# Patient Record
Sex: Female | Born: 1990 | Race: Black or African American | Hispanic: No | Marital: Single | Smoking: Never smoker
Health system: Southern US, Community
[De-identification: ages and names within clinical notes are randomized; demographics above are authoritative.]

## PROBLEM LIST (undated history)

## (undated) ENCOUNTER — Ambulatory Visit (HOSPITAL_COMMUNITY): Discharge status: Absent without leave | Payer: BLUE CROSS/BLUE SHIELD

## (undated) DIAGNOSIS — R05 Cough: Secondary | ICD-10-CM

## (undated) DIAGNOSIS — J3501 Chronic tonsillitis: Secondary | ICD-10-CM

## (undated) DIAGNOSIS — R198 Other specified symptoms and signs involving the digestive system and abdomen: Secondary | ICD-10-CM

## (undated) DIAGNOSIS — D649 Anemia, unspecified: Secondary | ICD-10-CM

## (undated) DIAGNOSIS — R1310 Dysphagia, unspecified: Secondary | ICD-10-CM

## (undated) HISTORY — PX: TYMPANOSTOMY TUBE PLACEMENT: SHX32

---
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
## 2011-06-11 ENCOUNTER — Inpatient Hospital Stay (INDEPENDENT_AMBULATORY_CARE_PROVIDER_SITE_OTHER)
Admission: RE | Admit: 2011-06-11 | Discharge: 2011-06-11 | Disposition: A | Discharge status: Home / Self Care | Payer: BC Managed Care – PPO | Source: Ambulatory Visit | Attending: Emergency Medicine | Admitting: Emergency Medicine

## 2011-06-11 DIAGNOSIS — H6690 Otitis media, unspecified, unspecified ear: Secondary | ICD-10-CM

## 2011-06-11 DIAGNOSIS — K1120 Sialoadenitis, unspecified: Secondary | ICD-10-CM

## 2011-06-11 DIAGNOSIS — J069 Acute upper respiratory infection, unspecified: Secondary | ICD-10-CM

## 2011-07-12 ENCOUNTER — Inpatient Hospital Stay (INDEPENDENT_AMBULATORY_CARE_PROVIDER_SITE_OTHER)
Admission: RE | Admit: 2011-07-12 | Discharge: 2011-07-12 | Disposition: A | Discharge status: Home / Self Care | Payer: Self-pay | Source: Ambulatory Visit | Attending: Family Medicine | Admitting: Family Medicine

## 2011-07-12 DIAGNOSIS — J029 Acute pharyngitis, unspecified: Secondary | ICD-10-CM

## 2011-07-12 LAB — POCT RAPID STREP A: Streptococcus, Group A Screen (Direct): NEGATIVE

## 2013-01-01 ENCOUNTER — Encounter (HOSPITAL_COMMUNITY): Payer: Self-pay | Admitting: *Deleted

## 2013-01-01 ENCOUNTER — Emergency Department (HOSPITAL_COMMUNITY)
Admission: EM | Admit: 2013-01-01 | Discharge: 2013-01-01 | Disposition: A | Discharge status: Home / Self Care | Payer: BC Managed Care – PPO | Source: Home / Self Care | Attending: Emergency Medicine | Admitting: Emergency Medicine

## 2013-01-01 DIAGNOSIS — R110 Nausea: Secondary | ICD-10-CM

## 2013-01-01 DIAGNOSIS — IMO0001 Reserved for inherently not codable concepts without codable children: Secondary | ICD-10-CM

## 2013-01-01 DIAGNOSIS — R109 Unspecified abdominal pain: Secondary | ICD-10-CM

## 2013-01-01 DIAGNOSIS — K219 Gastro-esophageal reflux disease without esophagitis: Secondary | ICD-10-CM

## 2013-01-01 LAB — POCT URINALYSIS DIP (DEVICE)
Glucose, UA: NEGATIVE mg/dL
Leukocytes, UA: NEGATIVE
Nitrite: NEGATIVE
Protein, ur: >=300 mg/dL — AB
Urobilinogen, UA: 1 mg/dL (ref 0.0–1.0)

## 2013-01-01 LAB — POCT PREGNANCY, URINE: Preg Test, Ur: NEGATIVE

## 2013-01-01 MED ORDER — ONDANSETRON 4 MG PO TBDP: 4.0000 mg | ORAL_TABLET | Freq: Three times a day (TID) | ORAL | Status: DC | PRN

## 2013-01-01 NOTE — ED Provider Notes (Signed)
History     CSN: [id number]  Arrival date & time 01/01/13  1408   First MD Initiated Contact with Patient 01/01/13 1628      Chief Complaint  Patient presents with  . Abdominal Pain  . Nausea    HPI: Patient is a 22 y.o. female presenting with abdominal pain. The history is provided by the patient.  Abdominal Pain The primary symptoms of the illness include abdominal pain, nausea, vomiting and vaginal bleeding. The primary symptoms of the illness do not include fever, shortness of breath, diarrhea, hematemesis, hematochezia, dysuria or vaginal discharge. The current episode started more than 2 days ago. The onset of the illness was gradual. The problem has not changed since onset. The abdominal pain began more than 2 days ago. The pain came on gradually. The abdominal pain has been unchanged since its onset. The abdominal pain is generalized. The abdominal pain does not radiate. The severity of the abdominal pain is 4/10. The abdominal pain is relieved by nothing.  Nausea began more than 1 week ago. The nausea is associated with eating.  The patient states that she believes she is currently not pregnant. The patient has had a change in bowel habit. Additional symptoms associated with the illness include anorexia and back pain. Symptoms associated with the illness do not include chills, diaphoresis, urgency or frequency.  Pt reports 3 week h/o generalized, intermittent abd pain, decreased appetite and intermittent nausea. States she has vomited x 2 after eating in the 3 weeks. The abd pain is most often in the upper abd region. She also reports constipation though admits she had a normal soft BM today. Also c/o intermittent low back pain. Pt states she is currently on her period but denies any recent unusual vag d/c or UTI sx's. Does admit that these symptoms seem to worsen after eating.   History reviewed. No pertinent past medical history.  History reviewed. No pertinent past surgical  history.  History reviewed. No pertinent family history.  History  Substance Use Topics  . Smoking status: Never Smoker   . Smokeless tobacco: Not on file  . Alcohol Use: No    OB History    Grav Para Term Preterm Abortions TAB SAB Ect Mult Living                  Review of Systems  Constitutional: Negative for fever, chills and diaphoresis.  HENT: Negative.   Eyes: Negative.   Respiratory: Negative.  Negative for shortness of breath.   Cardiovascular: Negative.   Gastrointestinal: Positive for nausea, vomiting, abdominal pain and anorexia. Negative for diarrhea, hematochezia and hematemesis.  Genitourinary: Positive for vaginal bleeding. Negative for dysuria, urgency, frequency and vaginal discharge.  Musculoskeletal: Positive for back pain.  Skin: Negative.   Neurological: Negative.   Hematological: Negative.   Psychiatric/Behavioral: Negative.     Allergies  Review of patient's allergies indicates no known allergies.  Home Medications  No current outpatient prescriptions on file.  BP 132/87  Pulse 82  Temp 98.3 F (36.8 C) (Oral)  Resp 18  SpO2 100%  LMP 01/01/2013  Physical Exam  Constitutional: She is oriented to person, place, and time. She appears well-developed and well-nourished.  Non-toxic appearance. She does not have a sickly appearance. She does not appear ill. No distress.  HENT:  Head: Normocephalic and atraumatic.  Eyes: Conjunctivae normal are normal.  Neck: Neck supple.  Cardiovascular: Normal rate and regular rhythm.   Pulmonary/Chest: Effort normal and breath sounds normal.  Abdominal: Soft. Bowel sounds are normal. She exhibits no distension. There is no tenderness. There is no rebound and no guarding.  Musculoskeletal: Normal range of motion.  Neurological: She is alert and oriented to person, place, and time.  Skin: Skin is warm and dry.  Psychiatric: She has a normal mood and affect.    ED Course  Procedures   Labs Reviewed   POCT URINALYSIS DIP (DEVICE) - Abnormal; Notable for the following:    Bilirubin Urine MODERATE (*)     Ketones, ur 15 (*)     Hgb urine dipstick LARGE (*)     Protein, ur >=300 (*)     All other components within normal limits  POCT PREGNANCY, URINE   No results found.   No diagnosis found.    MDM  3 week h/o vague GI sx's. Denies fever. PE unremarkable. Pt smiling and w/o obvious distress. Normal soft BM today despite c/o constipation. Will treat w/ ODT Zofran for nausea. Recommend OTC Omeprazole daily x 1 mo and encourage f/u w/ her PCP at [facility] if sx's worsen or do not improve. Pt and mother are agreeable.        Leanne Chang, NP 01/01/13 1751

## 2013-01-01 NOTE — ED Notes (Signed)
C/o headache onset 3 weeks ago then low back aching and generalized body aches.  C/o upper abdominal pain 2 weeks ago.  C/o nausea x 4 days and vominted x 1 this AM.  Poor appetitie.  Fever 101 last night.  Had loose BM this AM.

## 2013-01-01 NOTE — ED Provider Notes (Signed)
Medical screening examination/treatment/procedure(s) were performed by non-physician practitioner and as supervising physician I was immediately available for consultation/collaboration.  Raynald Blend, MD 01/01/13 [id number]

## 2013-09-24 ENCOUNTER — Encounter (HOSPITAL_COMMUNITY): Payer: Self-pay | Admitting: Emergency Medicine

## 2013-09-24 ENCOUNTER — Emergency Department (INDEPENDENT_AMBULATORY_CARE_PROVIDER_SITE_OTHER)
Admission: EM | Admit: 2013-09-24 | Discharge: 2013-09-24 | Disposition: A | Discharge status: Home / Self Care | Payer: BC Managed Care – PPO | Source: Home / Self Care | Attending: Family Medicine | Admitting: Family Medicine

## 2013-09-24 DIAGNOSIS — R079 Chest pain, unspecified: Secondary | ICD-10-CM

## 2013-09-24 DIAGNOSIS — L299 Pruritus, unspecified: Secondary | ICD-10-CM

## 2013-09-24 DIAGNOSIS — R109 Unspecified abdominal pain: Secondary | ICD-10-CM

## 2013-09-24 LAB — POCT URINALYSIS DIP (DEVICE)
Glucose, UA: NEGATIVE mg/dL
Leukocytes, UA: NEGATIVE
Nitrite: NEGATIVE
Protein, ur: NEGATIVE mg/dL
Urobilinogen, UA: 0.2 mg/dL (ref 0.0–1.0)

## 2013-09-24 LAB — POCT PREGNANCY, URINE: Preg Test, Ur: NEGATIVE

## 2013-09-24 MED ORDER — 4X PROBIOTIC PO TABS: 1.0000 | ORAL_TABLET | Freq: Every day | ORAL | Status: DC

## 2013-09-24 MED ORDER — PSYLLIUM 28 % PO PACK: 1.0000 | PACK | Freq: Every day | ORAL | Status: DC

## 2013-09-24 NOTE — ED Provider Notes (Signed)
Medical screening examination/treatment/procedure(s) were performed by a resident physician or non-physician practitioner and as the supervising physician I was immediately available for consultation/collaboration.  Desirai Traxler, MD    Socorro Ebron S Faraz Ponciano, MD 09/24/13 1913 

## 2013-09-24 NOTE — ED Provider Notes (Signed)
CSN: [id number]     Arrival date & time 09/24/13  1057 History   First MD Initiated Contact with Patient 09/24/13 1229     Chief Complaint  Patient presents with  . Abdominal Pain   (Consider location/radiation/quality/duration/timing/severity/associated sxs/prior Treatment) HPI Comments: 22 year old female presents complaining of intermittent abdominal pain, intermittent chest pains, intermittent arm pains, intermittent leg pains, itching of her skin of her scalp/arms/legs, and increased "stomach gurgle rumbles." This is been going on for approximately 9 months. She has googled this and she believe she has a stomach parasite. She has not tried taking anything to help any of her symptoms and she has never seen another doctor about this. She denies fever, chills, diarrhea, rectal itching, or vomiting. No travel or unclean water sources. She is urinating normally, no dark urine. No yellowing of skin or eyes. She notes she is not currently having any of the symptoms right now apart from the itching of her skin. No noticeable rash on her skin.   History reviewed. No pertinent past medical history. History reviewed. No pertinent past surgical history. History reviewed. No pertinent family history. History  Substance Use Topics  . Smoking status: Never Smoker   . Smokeless tobacco: Not on file  . Alcohol Use: No   OB History   Grav Para Term Preterm Abortions TAB SAB Ect Mult Living                 Review of Systems  Constitutional: Negative for fever and chills.  Eyes: Negative for visual disturbance.  Respiratory: Negative for cough and shortness of breath.   Cardiovascular: Positive for chest pain. Negative for palpitations and leg swelling.  Gastrointestinal: Positive for nausea and abdominal pain. Negative for vomiting.  Endocrine: Negative for polydipsia and polyuria.  Genitourinary: Negative for dysuria, urgency and frequency.  Musculoskeletal: Positive for arthralgias and  myalgias.  Skin: Negative for rash.       Itching  Neurological: Negative for dizziness, weakness and light-headedness.    Allergies  Review of patient's allergies indicates no known allergies.  Home Medications   Current Outpatient Rx  Name  Route  Sig  Dispense  Refill  . ondansetron (ZOFRAN ODT) 4 MG disintegrating tablet   Oral   Take 1 tablet (4 mg total) by mouth every 8 (eight) hours as needed for nausea.   20 tablet   0   . Probiotic Product (4X PROBIOTIC) TABS   Oral   Take 1-2 capsules by mouth daily.   30 tablet   2     Any combined species probiotic will be appropriate ...   . psyllium (METAMUCIL SMOOTH TEXTURE) 28 % packet   Oral   Take 1 packet by mouth daily.   30 packet   2    BP 117/67  Pulse 67  Temp(Src) 98.7 F (37.1 C) (Oral)  Resp 16  SpO2 100%  LMP 08/03/2013 Physical Exam  Nursing note and vitals reviewed. Constitutional: She is oriented to person, place, and time. Vital signs are normal. She appears well-developed and well-nourished. No distress.  HENT:  Head: Normocephalic and atraumatic.  Right Ear: External ear normal.  Left Ear: External ear normal.  Nose: Nose normal.  Mouth/Throat: Oropharynx is clear and moist. No oropharyngeal exudate.  Eyes: Conjunctivae and EOM are normal. Pupils are equal, round, and reactive to light. Right eye exhibits no discharge. Left eye exhibits no discharge. No scleral icterus.  Neck: Normal range of motion. Neck supple. No thyromegaly present.  Cardiovascular: Normal rate, regular rhythm, normal heart sounds and intact distal pulses.   Pulmonary/Chest: Effort normal and breath sounds normal. No respiratory distress. She has no wheezes. She has no rales.  Abdominal: Bowel sounds are normal. She exhibits no distension and no mass. There is no tenderness. There is no rebound and no guarding.  Musculoskeletal: Normal range of motion. She exhibits no tenderness.  Lymphadenopathy:    She has no cervical  adenopathy.  Neurological: She is alert and oriented to person, place, and time. She has normal strength. Coordination normal.  Skin: Skin is warm and dry. No rash noted. She is not diaphoretic. No erythema. No pallor.  Psychiatric: She has a normal mood and affect. Judgment normal.    ED Course  Procedures (including critical care time) Labs Review Labs Reviewed  STOOL CULTURE  OVA AND PARASITE EXAMINATION  POCT URINALYSIS DIP (DEVICE)  POCT PREGNANCY, URINE   Imaging Review No results found.    MDM   1. Abdominal  pain, other specified site   2. Itchy skin   3. Chest pain    Detailed physical exam is normal. We will check stool ova and parasites at the patient's request. She needs a primary care physician, I have provided her information on finding 1. She will follow up with primary care.   Meds ordered this encounter  Medications  . Probiotic Product (4X PROBIOTIC) TABS    Sig: Take 1-2 capsules by mouth daily.    Dispense:  30 tablet    Refill:  2    Any combined species probiotic will be appropriate.    Order Specific Question:  Supervising Provider    Answer:  Clementeen Graham, Henrietta Dine  . psyllium (METAMUCIL SMOOTH TEXTURE) 28 % packet    Sig: Take 1 packet by mouth daily.    Dispense:  30 packet    Refill:  2    Order Specific Question:  Supervising Provider    Answer:  Clementeen Graham, Kathie Rhodes [[id number]]     Graylon Good, PA-C 09/24/13 1829

## 2013-09-24 NOTE — ED Notes (Signed)
C/o abd pain which has been hurting for a while now in different spots.  Patient states she does have back.

## 2013-09-26 LAB — OVA AND PARASITE EXAMINATION: Ova and parasites: NONE SEEN

## 2014-03-14 ENCOUNTER — Encounter (HOSPITAL_COMMUNITY): Payer: Self-pay | Admitting: Emergency Medicine

## 2014-03-14 ENCOUNTER — Emergency Department (INDEPENDENT_AMBULATORY_CARE_PROVIDER_SITE_OTHER)
Admission: EM | Admit: 2014-03-14 | Discharge: 2014-03-14 | Disposition: A | Discharge status: Home / Self Care | Payer: BC Managed Care – PPO | Source: Home / Self Care

## 2014-03-14 DIAGNOSIS — H5710 Ocular pain, unspecified eye: Secondary | ICD-10-CM

## 2014-03-14 DIAGNOSIS — H109 Unspecified conjunctivitis: Secondary | ICD-10-CM

## 2014-03-14 DIAGNOSIS — H5711 Ocular pain, right eye: Secondary | ICD-10-CM

## 2014-03-14 MED ORDER — TETRACAINE HCL 0.5 % OP SOLN
OPHTHALMIC | Status: AC
  Filled 2014-03-14: qty 2

## 2014-03-14 MED ORDER — TOBRAMYCIN-DEXAMETHASONE 0.3-0.1 % OP SUSP: 1.0000 [drp] | OPHTHALMIC | Status: DC

## 2014-03-14 NOTE — ED Provider Notes (Signed)
CSN: [id number]     Arrival date & time 03/14/14  1154 History   First MD Initiated Contact with Patient 03/14/14 1314     Chief Complaint  Patient presents with  . Eye Pain   (Consider location/radiation/quality/duration/timing/severity/associated sxs/prior Treatment) HPI Comments: 23 year old female is complaining of a foreign body sensation to the right for 2 days. She has some watery discharge. Denies visual disturbance. Whenever she moves her eye right, left, upper daily this gives her the sensation of a foreign body.   History reviewed. No pertinent past medical history. History reviewed. No pertinent past surgical history. No family history on file. History  Substance Use Topics  . Smoking status: Never Smoker   . Smokeless tobacco: Not on file  . Alcohol Use: No   OB History   Grav Para Term Preterm Abortions TAB SAB Ect Mult Living                 Review of Systems  Constitutional: Negative.   HENT: Negative.   Eyes: Positive for pain and discharge. Negative for photophobia and visual disturbance.  All other systems reviewed and are negative.   Allergies  Review of patient's allergies indicates no known allergies.  Home Medications   Prior to Admission medications   Medication Sig Start Date End Date Taking? Authorizing Provider  ondansetron (ZOFRAN ODT) 4 MG disintegrating tablet Take 1 tablet (4 mg total) by mouth every 8 (eight) hours as needed for nausea. 01/01/13   Roma KayserKatherine P Schorr, NP  Probiotic Product (4X PROBIOTIC) TABS Take 1-2 capsules by mouth daily. 09/24/13   Adrian BlackwaterZachary H Baker, PA-C  psyllium (METAMUCIL SMOOTH TEXTURE) 28 % packet Take 1 packet by mouth daily. 09/24/13   Adrian BlackwaterZachary H Baker, PA-C   BP 143/89  Pulse 100  Temp(Src) 98.6 F (37 C) (Oral)  Resp 16  SpO2 100%  LMP 03/07/2014 Physical Exam  Nursing note and vitals reviewed. Constitutional: She is oriented to person, place, and time. She appears well-developed and well-nourished. No  distress.  Eyes: EOM are normal. Pupils are equal, round, and reactive to light.  No obvious foreign body seen with the naked eye. There is mild erythema to the lower conjunctiva. No stye formation or other pustules along the eyelash line. After introduction of tetracaine eyedrops the fluorescin stain was applied and examined under magnification and blue light. There was no uptake. No corneal abrasions appreciated. There is swelling to the lateral conjunctival sac. No foreign bodies were seen. No limbal flush. Anterior chamber is clear. The eye was then irrigated with 100 cc of wash.  Neck: Normal range of motion. Neck supple.  Neurological: She is alert and oriented to person, place, and time.  Skin: Skin is warm and dry.  Psychiatric: She has a normal mood and affect.    ED Course  Irrigation Date/Time: 03/14/2014 1:46 PM Performed by: Phineas RealMABE, Damonie Furney Authorized by: Leslee HomeKELLER, Makyla Bye C Consent: Verbal consent obtained. Risks and benefits: risks, benefits and alternatives were discussed Consent given by: patient Patient understanding: patient states understanding of the procedure being performed Patient identity confirmed: verbally with patient Local anesthesia used: yes Local anesthetic: topical anesthetic Patient sedated: no Patient tolerance: Patient tolerated the procedure well with no immediate complications. Comments: Tetracaine eye drops x 2 to OD, Fluorisceine stain applied and visualized under blue light and magnification. Irrigated with 100 cc eye wash   (including critical care time) Labs Review Labs Reviewed - No data to display  Results for orders placed during the  hospital encounter of 09/24/13  OVA AND PARASITE EXAMINATION      Result Value Ref Range   Specimen Description STOOL     Special Requests NONE     Ova and parasites       Value: NO OVA OR PARASITES SEEN     [facility]formed at [facility]   Report Status 09/26/2013 FINAL    POCT URINALYSIS DIP (DEVICE)       Result Value Ref Range   Glucose, UA NEGATIVE  NEGATIVE mg/dL   Bilirubin Urine NEGATIVE  NEGATIVE   Ketones, ur NEGATIVE  NEGATIVE mg/dL   Specific Gravity, Urine 1.025  1.005 - 1.030   Hgb urine dipstick NEGATIVE  NEGATIVE   pH 7.0  5.0 - 8.0   Protein, ur NEGATIVE  NEGATIVE mg/dL   Urobilinogen, UA 0.2  0.0 - 1.0 mg/dL   Nitrite NEGATIVE  NEGATIVE   Leukocytes, UA NEGATIVE  NEGATIVE  POCT PREGNANCY, URINE      Result Value Ref Range   Preg Test, Ur NEGATIVE  NEGATIVE   Imaging Review No results found.   MDM   1. Pain in right eye   2. Conjunctivitis      tobradex eye drops as directed F/U with opthal in 2 d if not improving.  Hayden Rasmussenavid Sakiyah Shur, NP 03/14/14 1348

## 2014-03-14 NOTE — ED Notes (Signed)
Complains that something is in right eye, pain started 2 days ago.  No idea what may have flown in eye.  Vision blurry-usually wears glasses, broke them recently

## 2014-03-14 NOTE — Discharge Instructions (Signed)

## 2014-03-15 NOTE — ED Provider Notes (Signed)
Medical screening examination/treatment/procedure(s) were performed by non-physician practitioner and as supervising physician I was immediately available for consultation/collaboration.  Leslee Homeavid Gurveer Colucci, M.D.  Reuben Likesavid C Elma Limas, MD 03/15/14 [phone]

## 2014-08-04 ENCOUNTER — Encounter (HOSPITAL_COMMUNITY): Payer: Self-pay | Admitting: Emergency Medicine

## 2014-08-04 ENCOUNTER — Emergency Department (HOSPITAL_COMMUNITY)
Admission: EM | Admit: 2014-08-04 | Discharge: 2014-08-04 | Disposition: A | Discharge status: Home / Self Care | Payer: BC Managed Care – PPO | Source: Home / Self Care

## 2014-08-04 DIAGNOSIS — H9202 Otalgia, left ear: Secondary | ICD-10-CM

## 2014-08-04 DIAGNOSIS — H6980 Other specified disorders of Eustachian tube, unspecified ear: Secondary | ICD-10-CM

## 2014-08-04 DIAGNOSIS — H6983 Other specified disorders of Eustachian tube, bilateral: Secondary | ICD-10-CM

## 2014-08-04 DIAGNOSIS — H6120 Impacted cerumen, unspecified ear: Secondary | ICD-10-CM

## 2014-08-04 DIAGNOSIS — H6990 Unspecified Eustachian tube disorder, unspecified ear: Secondary | ICD-10-CM

## 2014-08-04 DIAGNOSIS — H6122 Impacted cerumen, left ear: Secondary | ICD-10-CM

## 2014-08-04 DIAGNOSIS — H9209 Otalgia, unspecified ear: Secondary | ICD-10-CM

## 2014-08-04 NOTE — ED Provider Notes (Signed)
Medical screening examination/treatment/procedure(s) were performed by resident physician or non-physician practitioner and as supervising physician I was immediately available for consultation/collaboration.   Keelyn Monjaras DOUGLAS MD.   Agness Sibrian D Elaijah Munoz, MD 08/04/14 1941 

## 2014-08-04 NOTE — ED Provider Notes (Signed)
CSN: [id number]     Arrival date & time 08/04/14  1612 History   First MD Initiated Contact with Patient 08/04/14 1658     Chief Complaint  Patient presents with  . Otalgia   (Consider location/radiation/quality/duration/timing/severity/associated sxs/prior Treatment) HPI Comments: 23 year old female complaining of pain in the left ear for 2 weeks. Denies drainage. Denies sore throat or fever. Denies trauma.   History reviewed. No pertinent past medical history. History reviewed. No pertinent past surgical history. No family history on file. History  Substance Use Topics  . Smoking status: Never Smoker   . Smokeless tobacco: Not on file  . Alcohol Use: No   OB History   Grav Para Term Preterm Abortions TAB SAB Ect Mult Living                 Review of Systems  Constitutional: Negative.   HENT: Positive for ear pain. Negative for congestion, dental problem and hearing loss.   Respiratory: Negative.   Neurological: Negative.     Allergies  Review of patient's allergies indicates no known allergies.  Home Medications   Prior to Admission medications   Medication Sig Start Date End Date Taking? Authorizing Provider  ondansetron (ZOFRAN ODT) 4 MG disintegrating tablet Take 1 tablet (4 mg total) by mouth every 8 (eight) hours as needed for nausea. 01/01/13   Roma Kayser Schorr, NP  Probiotic Product (4X PROBIOTIC) TABS Take 1-2 capsules by mouth daily. 09/24/13   Adrian Blackwater Baker, PA-C  psyllium (METAMUCIL SMOOTH TEXTURE) 28 % packet Take 1 packet by mouth daily. 09/24/13   Adrian Blackwater Baker, PA-C  tobramycin-dexamethasone [facility]) ophthalmic solution Place 1 drop into the right eye every 4 (four) hours while awake. 03/14/14   Hayden Rasmussen, NP   BP 135/80  Pulse 60  Temp(Src) 98.6 F (37 C) (Oral)  SpO2 100%  LMP 07/21/2014 Physical Exam  Nursing note and vitals reviewed. Constitutional: She is oriented to person, place, and time. She appears well-developed and well-nourished.  No distress.  HENT:  Head: Normocephalic and atraumatic.  Mouth/Throat: Oropharynx is clear and moist. No oropharyngeal exudate.  Right TM pearly gray, transparent and mildly retracted. EAC is clear. Left TM partially occluded with cerumen area TM retracted. Post irrigation: EAC is clear of cerumen. TMs is translucent and pearly gray, no erythema or evidence of effusion/fluid. Positive retraction.  Eyes: Conjunctivae and EOM are normal. Pupils are equal, round, and reactive to light.  Neck: Normal range of motion. Neck supple.  Cardiovascular: Normal rate.   Pulmonary/Chest: Effort normal.  Neurological: She is alert and oriented to person, place, and time. She exhibits normal muscle tone.  Skin: Skin is warm and dry.  Psychiatric: She has a normal mood and affect.    ED Course  Procedures (including critical care time) Labs Review Labs Reviewed - No data to display  Imaging Review No results found.   MDM   1. Otalgia of left ear   2. Excess wax in ear, left   3. ETD (eustachian tube dysfunction), bilateral     Sudafed PE 10 mg for congestion Robitussin plain Claritin Apply heat next to the ear     Hayden Rasmussen, NP 08/04/14 1725

## 2014-08-04 NOTE — Discharge Instructions (Signed)
Otalgia Sudafed PE 10 mg for congestion Robitussin plain Claritin Apply heat next to the ear The most common reason for this in children is an infection of the middle ear. Pain from the middle ear is usually caused by a build-up of fluid and pressure behind the eardrum. Pain from an earache can be sharp, dull, or burning. The pain may be temporary or constant. The middle ear is connected to the nasal passages by a short narrow tube called the Eustachian tube. The Eustachian tube allows fluid to drain out of the middle ear, and helps keep the pressure in your ear equalized. CAUSES  A cold or allergy can block the Eustachian tube with inflammation and the build-up of secretions. This is especially likely in small children, because their Eustachian tube is shorter and more horizontal. When the Eustachian tube closes, the normal flow of fluid from the middle ear is stopped. Fluid can accumulate and cause stuffiness, pain, hearing loss, and an ear infection if germs start growing in this area. SYMPTOMS  The symptoms of an ear infection may include fever, ear pain, fussiness, increased crying, and irritability. Many children will have temporary and minor hearing loss during and right after an ear infection. Permanent hearing loss is rare, but the risk increases the more infections a child has. Other causes of ear pain include retained water in the outer ear canal from swimming and bathing. Ear pain in adults is less likely to be from an ear infection. Ear pain may be referred from other locations. Referred pain may be from the joint between your jaw and the skull. It may also come from a tooth problem or problems in the neck. Other causes of ear pain include:  A foreign body in the ear.  Outer ear infection.  Sinus infections.  Impacted ear wax.  Ear injury.  Arthritis of the jaw or TMJ problems.  Middle ear infection.  Tooth infections.  Sore throat with pain to the ears. DIAGNOSIS  Your  caregiver can usually make the diagnosis by examining you. Sometimes other special studies, including x-rays and lab work may be necessary. TREATMENT   If antibiotics were prescribed, use them as directed and finish them even if you or your child's symptoms seem to be improved.  Sometimes PE tubes are needed in children. These are little plastic tubes which are put into the eardrum during a simple surgical procedure. They allow fluid to drain easier and allow the pressure in the middle ear to equalize. This helps relieve the ear pain caused by pressure changes. HOME CARE INSTRUCTIONS   Only take over-the-counter or prescription medicines for pain, discomfort, or fever as directed by your caregiver. DO NOT GIVE CHILDREN ASPIRIN because of the association of Reye's Syndrome in children taking aspirin.  Use a cold pack applied to the outer ear for 15-20 minutes, 03-04 times per day or as needed may reduce pain. Do not apply ice directly to the skin. You may cause frost bite.  Over-the-counter ear drops used as directed may be effective. Your caregiver may sometimes prescribe ear drops.  Resting in an upright position may help reduce pressure in the middle ear and relieve pain.  Ear pain caused by rapidly descending from high altitudes can be relieved by swallowing or chewing gum. Allowing infants to suck on a bottle during airplane travel can help.  Do not smoke in the house or near children. If you are unable to quit smoking, smoke outside.  Control allergies. SEEK IMMEDIATE MEDICAL  CARE IF:   You or your child are becoming sicker.  Pain or fever relief is not obtained with medicine.  You or your child's symptoms (pain, fever, or irritability) do not improve within 24 to 48 hours or as instructed.  Severe pain suddenly stops hurting. This may indicate a ruptured eardrum.  You or your children develop new problems such as severe headaches, stiff neck, difficulty swallowing, or swelling of  the face or around the ear. Document Released: 07/03/2004 Document Revised: 02/08/2012 Document Reviewed: 11/07/2008 [facility] Patient Information 2015 [place], [place]. This information is not intended to replace advice given to you by your health care provider. Make sure you discuss any questions you have with your health care provider.

## 2014-08-04 NOTE — ED Notes (Signed)
C/o left ear pain onset 2 weeks Denies f/v/n/d, cold sx Alert, no signs of acute distress.

## 2014-10-30 DIAGNOSIS — J3501 Chronic tonsillitis: Secondary | ICD-10-CM

## 2014-10-30 HISTORY — DX: Chronic tonsillitis: J35.01

## 2014-11-04 ENCOUNTER — Emergency Department (INDEPENDENT_AMBULATORY_CARE_PROVIDER_SITE_OTHER)
Admission: EM | Admit: 2014-11-04 | Discharge: 2014-11-04 | Disposition: A | Discharge status: Home / Self Care | Payer: BC Managed Care – PPO | Source: Home / Self Care | Attending: Family Medicine | Admitting: Family Medicine

## 2014-11-04 ENCOUNTER — Encounter (HOSPITAL_COMMUNITY): Payer: Self-pay | Admitting: *Deleted

## 2014-11-04 DIAGNOSIS — J039 Acute tonsillitis, unspecified: Secondary | ICD-10-CM

## 2014-11-04 MED ORDER — AMOXICILLIN-POT CLAVULANATE 875-125 MG PO TABS: 1.0000 | ORAL_TABLET | Freq: Two times a day (BID) | ORAL | Status: DC

## 2014-11-04 NOTE — ED Provider Notes (Signed)
[name] R Morand is a 23 y.o. female who presents to Urgent Care today for tonsil swelling and pain. Symptoms present for about a week. Additionally she notes headache. Pain is moderate and worse with swallowing. No chills nausea vomiting or diarrhea. She additionally notes mild left-sided ear pain. No cough congestion chest pain or palpitations. Patient notes a mild subjective fever.  Patient states that she would like her tonsils removed today if possible.   History reviewed. No pertinent past medical history. History reviewed. No pertinent past surgical history. History  Substance Use Topics  . Smoking status: Never Smoker   . Smokeless tobacco: Not on file  . Alcohol Use: No   ROS as above Medications: No current facility-administered medications for this encounter.   Current Outpatient Prescriptions  Medication Sig Dispense Refill  . amoxicillin-clavulanate (AUGMENTIN) 875-125 MG per tablet Take 1 tablet by mouth every 12 (twelve) hours. 14 tablet 0   No Known Allergies   Exam:  BP 135/72 mmHg  Pulse 90  Temp(Src) 98.3 F (36.8 C) (Oral)  Resp 12  SpO2 100%  LMP 10/14/2014 (Approximate) Gen: Well NAD HEENT: EOMI,  MMM tonsils swollen with exudates bilaterally. Tympanic membranes are normal appearing bilaterally. Lungs: Normal work of breathing. CTABL Heart: RRR no MRG Abd: NABS, Soft. Nondistended, Nontender Exts: Brisk capillary refill, warm and well perfused.   No results found for this or any previous visit (from the past 24 hour(s)). No results found.  Assessment and Plan: 23 y.o. female with tonsillitis. Treatment with Augmentin.  Informed patient that she cannot have a tonsillectomy today however she [facility] and [facility] if she would like. NSAIDs or Tylenol for pain control.  Discussed warning signs or symptoms. Please see discharge instructions. Patient expresses understanding.     Rodolph BongEvan S Casanova Schurman,  MD 11/04/14 [phone]

## 2014-11-04 NOTE — ED Notes (Signed)
C/O enlarged, painful tonsils with fevers and slight ear pain x1 wk.  Has tried Theraflu, tea.

## 2014-11-04 NOTE — Discharge Instructions (Signed)
Thank you for coming in today. Take Augmentin twice daily for tonsillitis.  [facility] for tonsil removal.  Tonsillitis Tonsillitis is an infection of the throat that causes the tonsils to become red, tender, and swollen. Tonsils are collections of lymphoid tissue at the back of the throat. Each tonsil has crevices (crypts). Tonsils help fight nose and throat infections and keep infection from spreading to other parts of the body for the first 18 months of life.  CAUSES Sudden (acute) tonsillitis is usually caused by infection with streptococcal bacteria. Long-lasting (chronic) tonsillitis occurs when the crypts of the tonsils become filled with pieces of food and bacteria, which makes it easy for the tonsils to become repeatedly infected. SYMPTOMS  Symptoms of tonsillitis include:  A sore throat, with possible difficulty swallowing.  White patches on the tonsils.  Fever.  Tiredness.  New episodes of snoring during sleep, when you did not snore before.  Small, foul-smelling, yellowish-white pieces of material (tonsilloliths) that you occasionally cough up or spit out. The tonsilloliths can also cause you to have bad breath. DIAGNOSIS Tonsillitis can be diagnosed through a physical exam. Diagnosis can be confirmed with the results of lab tests, including a throat culture. TREATMENT  The goals of tonsillitis treatment include the reduction of the severity and duration of symptoms and prevention of associated conditions. Symptoms of tonsillitis can be improved with the use of steroids to reduce the swelling. Tonsillitis caused by bacteria can be treated with antibiotic medicines. Usually, treatment with antibiotic medicines is started before the cause of the tonsillitis is known. However, if it is determined that the cause is not bacterial, antibiotic medicines will not treat the tonsillitis. If attacks of tonsillitis are severe and frequent,  your health care provider may recommend surgery to remove the tonsils (tonsillectomy). HOME CARE INSTRUCTIONS   Rest as much as possible and get plenty of sleep.  Drink plenty of fluids. While the throat is very sore, eat soft foods or liquids, such as sherbet, soups, or instant breakfast drinks.  Eat frozen ice pops.  Gargle with a warm or cold liquid to help soothe the throat. Mix 1/4 teaspoon of salt and 1/4 teaspoon of baking soda in 8 oz of water. SEEK MEDICAL CARE IF:   Large, tender lumps develop in your neck.  A rash develops.  A green, yellow-brown, or bloody substance is coughed up.  You are unable to swallow liquids or food for 24 hours.  You notice that only one of the tonsils is swollen. SEEK IMMEDIATE MEDICAL CARE IF:   You develop any new symptoms such as vomiting, severe headache, stiff neck, chest pain, or trouble breathing or swallowing.  You have severe throat pain along with drooling or voice changes.  You have severe pain, unrelieved with recommended medications.  You are unable to fully open the mouth.  You develop redness, swelling, or severe pain anywhere in the neck.  You have a fever. MAKE SURE YOU:   Understand these instructions.  Will watch your condition.  Will get help right away if you are not doing well or get worse. Document Released: 08/26/2005 Document Revised: 04/02/2014 Document Reviewed: 05/05/2013 [facility] Patient Information 2015 [place], [place]. This information is not intended to replace advice given to you by your health care provider. Make sure you discuss any questions you have with your health care provider.

## 2014-11-09 ENCOUNTER — Encounter (HOSPITAL_BASED_OUTPATIENT_CLINIC_OR_DEPARTMENT_OTHER): Payer: Self-pay | Admitting: *Deleted

## 2014-11-09 DIAGNOSIS — R059 Cough, unspecified: Secondary | ICD-10-CM

## 2014-11-09 HISTORY — DX: Cough, unspecified: R05.9

## 2014-11-13 ENCOUNTER — Other Ambulatory Visit: Payer: Self-pay | Admitting: Otolaryngology

## 2014-11-13 NOTE — H&P (Signed)
Cheryl Adams,  Gerrianne 23 y.o., female [id number]     Chief Complaint: Recurrent sore throats  HPI: 23 year old black female comes in for evaluation of recurrent sore throats/tonsillitis.  She had tubes in her infancy which are long gone and  her ears are doing well.  For the past several years, she has had frequent recurrent episodes of tonsillitis with large tonsils.  None of these have been strep positive.  She typically is treated with antibiotics and responds promptly.  This past weekend, her throat was sore to the point where her face and neck seemed swollen.  She typically gets increased snoring, headache, ear pain, and nasal congestion along with the tonsillitis.  She does not smoke.  Is not clear that she has sleep apnea.  She has never had mononucleosis to her knowledge.   We decided to proceed with tonsillectomy, possible adenoidectomy.  I discussed the surgery with her in detail including risks and complications.  Questions were answered and informed consent was obtained.  I discussed advancement of diet and activity including return to work and school.  Prescriptions for oxycodone liquid and hydrocodone liquid were written and given.  I will see her back 10 days after surgery.  PMH: Past Medical History  Diagnosis Date  . Chronic tonsillitis 10/2014  . Cough 11/09/2014  . Difficulty swallowing pills     Surg Hx: Past Surgical History  Procedure Laterality Date  . Tympanostomy tube placement      FHx:  No family history on file. SocHx:  reports that she has never smoked. She has never used smokeless tobacco. She reports that she does not drink alcohol or use illicit drugs.  ALLERGIES: No Known Allergies   (Not in a hospital admission)  No results found for this or any previous visit (from the past 48 hour(s)). No results found.  VHQ:IONGEXBMROS:Systemic: Feeling tired (fatigue).  No fever, no night sweats, and no recent weight loss. Eyes: No eye symptoms. Otolaryngeal: No hearing loss.   Earache.  No tinnitus.  Purulent nasal discharge.  No nasal passage blockage (stuffiness).  Snoring.  No sneezing.  Hoarseness  and sore throat. Cardiovascular: No chest pain or discomfort  and no palpitations. Pulmonary: No dyspnea.  Cough.  No wheezing. Gastrointestinal: No dysphagia  and no heartburn.  No nausea, no abdominal pain, and no melena.  No diarrhea. Genitourinary: No dysuria. Endocrine: No muscle weakness. Musculoskeletal: No calf muscle cramps, no arthralgias, and no soft tissue swelling. Neurological: No dizziness, no fainting, no tingling, and no numbness. Psychological: No anxiety  and no depression. Skin: No rash.  Last menstrual period 10/26/2014.   BP:128/76,  HR: 82 b/min,  Height: 5 ft 5.5 in, Weight: 223 lb , BMI: 36.5 kg/m2,   PHYSICAL EXAM: She is heavyset.  She is quite animated.  Mental status is intact.  She appears well in conversational speech.  Voice is clear and respirations unlabored through nose and mouth.  No velopharyngeal insufficiency.  The head is atraumatic and neck supple.  Cranial nerves intact.  Ear canals are clear with normal drums.  Anterior nose mildly congested.  Oral cavity reveals teeth in good repair.  Oropharynx shows bulky 3+ tonsils with active exudate on both sides.  Normal soft palate.  Neck with some mild tender bilateral jugulodigastric adenopathy.   Lungs: Clear to auscultation Heart: Regular rate and rhythm without murmurs Abdomen: Soft, active Extremities: Normal configuration Neurologic: Symmetric, grossly intact.    Assessment/Plan Tonsillitis, chronic (474.00) (J35.01  You're having too many  episodes of tonsillitis.  I do recommend that we take your tonsils out, and any adenoids that may remain.  No strenuous activities for 2 weeks after surgery.  I am leaving you prescriptions for liquid oxycodone which is a strong narcotic pain medication, and hydrocodone liquid narcotic pain medication which is lesser strength.  I will  plan on seeing you back 10 days after surgery.  See our tonsillectomy instructions.  OxyCODONE HCl - 5 MG/5ML Oral Solution;5-10 ml po q4h prn severe pain; Qty300; R0; Rx. Hydrocodone-Acetaminophen 7.5-325 MG/15ML Oral Solution;10-20 ml po q4-6h prn pain; Qty400; R0; Rx  Flo ShanksWOLICKI, Davontay [place]lington 11/13/2014, 1:13 PM

## 2014-11-14 ENCOUNTER — Encounter (HOSPITAL_BASED_OUTPATIENT_CLINIC_OR_DEPARTMENT_OTHER)
Admission: RE | Disposition: A | Discharge status: Home / Self Care | Payer: Self-pay | Source: Ambulatory Visit | Attending: Otolaryngology

## 2014-11-14 ENCOUNTER — Ambulatory Visit (HOSPITAL_BASED_OUTPATIENT_CLINIC_OR_DEPARTMENT_OTHER): Payer: BC Managed Care – PPO | Admitting: Anesthesiology

## 2014-11-14 ENCOUNTER — Ambulatory Visit (HOSPITAL_BASED_OUTPATIENT_CLINIC_OR_DEPARTMENT_OTHER)
Admission: RE | Admit: 2014-11-14 | Discharge: 2014-11-14 | Disposition: A | Discharge status: Home / Self Care | Payer: BC Managed Care – PPO | Source: Ambulatory Visit | Attending: Otolaryngology | Admitting: Otolaryngology

## 2014-11-14 ENCOUNTER — Encounter (HOSPITAL_BASED_OUTPATIENT_CLINIC_OR_DEPARTMENT_OTHER): Payer: Self-pay | Admitting: *Deleted

## 2014-11-14 DIAGNOSIS — R05 Cough: Secondary | ICD-10-CM | POA: Insufficient documentation

## 2014-11-14 DIAGNOSIS — R1319 Other dysphagia: Secondary | ICD-10-CM | POA: Diagnosis not present

## 2014-11-14 DIAGNOSIS — J3501 Chronic tonsillitis: Secondary | ICD-10-CM | POA: Diagnosis present

## 2014-11-14 DIAGNOSIS — Z6839 Body mass index (BMI) 39.0-39.9, adult: Secondary | ICD-10-CM | POA: Diagnosis not present

## 2014-11-14 DIAGNOSIS — R5383 Other fatigue: Secondary | ICD-10-CM | POA: Diagnosis not present

## 2014-11-14 DIAGNOSIS — J039 Acute tonsillitis, unspecified: Secondary | ICD-10-CM

## 2014-11-14 HISTORY — DX: Other specified symptoms and signs involving the digestive system and abdomen: R19.8

## 2014-11-14 HISTORY — PX: TONSILLECTOMY AND ADENOIDECTOMY: SHX28

## 2014-11-14 HISTORY — DX: Chronic tonsillitis: J35.01

## 2014-11-14 HISTORY — DX: Cough: R05

## 2014-11-14 HISTORY — DX: Dysphagia, unspecified: R13.10

## 2014-11-14 LAB — POCT HEMOGLOBIN-HEMACUE
HEMOGLOBIN: 8.2 g/dL — AB (ref 12.0–15.0)
Hemoglobin: 9.4 g/dL — ABNORMAL LOW (ref 12.0–15.0)

## 2014-11-14 SURGERY — TONSILLECTOMY AND ADENOIDECTOMY
Anesthesia: General | Site: Mouth | Laterality: Bilateral

## 2014-11-14 MED ORDER — DEXAMETHASONE SODIUM PHOSPHATE 10 MG/ML IJ SOLN: 8.0000 mg | Freq: Once | INTRAMUSCULAR | Status: DC

## 2014-11-14 MED ORDER — LACTATED RINGERS IV SOLN
INTRAVENOUS | Status: DC
  Administered 2014-11-14: 08:00:00 via INTRAVENOUS

## 2014-11-14 MED ORDER — MIDAZOLAM HCL 5 MG/5ML IJ SOLN
INTRAMUSCULAR | Status: DC | PRN
  Administered 2014-11-14: 2 mg via INTRAVENOUS

## 2014-11-14 MED ORDER — POVIDONE-IODINE 10 % EX SOLN
CUTANEOUS | Status: DC | PRN
Start: 2014-11-14 — End: 2014-11-14
  Administered 2014-11-14: 1 via TOPICAL

## 2014-11-14 MED ORDER — MIDAZOLAM HCL 2 MG/ML PO SYRP: 12.0000 mg | ORAL_SOLUTION | Freq: Once | ORAL | Status: DC | PRN

## 2014-11-14 MED ORDER — PROPOFOL 10 MG/ML IV BOLUS
INTRAVENOUS | Status: DC | PRN
  Administered 2014-11-14: 200 mg via INTRAVENOUS

## 2014-11-14 MED ORDER — LIDOCAINE-EPINEPHRINE (PF) 1 %-1:200000 IJ SOLN
INTRAMUSCULAR | Status: AC
  Filled 2014-11-14: qty 10

## 2014-11-14 MED ORDER — MORPHINE SULFATE 2 MG/ML IJ SOLN: 1.0000 mg | INTRAMUSCULAR | Status: DC | PRN

## 2014-11-14 MED ORDER — DEXTROSE-NACL 5-0.45 % IV SOLN
INTRAVENOUS | Status: DC
  Administered 2014-11-14: 12:00:00 via INTRAVENOUS

## 2014-11-14 MED ORDER — PROPOFOL 10 MG/ML IV BOLUS
INTRAVENOUS | Status: AC
  Filled 2014-11-14: qty 40

## 2014-11-14 MED ORDER — ONDANSETRON HCL 4 MG/2ML IJ SOLN
INTRAMUSCULAR | Status: DC | PRN
  Administered 2014-11-14: 4 mg via INTRAVENOUS

## 2014-11-14 MED ORDER — HYDROMORPHONE HCL 1 MG/ML IJ SOLN: 0.2500 mg | INTRAMUSCULAR | Status: DC | PRN

## 2014-11-14 MED ORDER — MIDAZOLAM HCL 2 MG/2ML IJ SOLN: 1.0000 mg | INTRAMUSCULAR | Status: DC | PRN

## 2014-11-14 MED ORDER — OXYCODONE HCL 5 MG PO TABS: 5.0000 mg | ORAL_TABLET | Freq: Once | ORAL | Status: DC | PRN

## 2014-11-14 MED ORDER — SODIUM CHLORIDE 0.9 % IV SOLN
2.0000 g | Freq: Once | INTRAVENOUS | Status: AC
  Administered 2014-11-14: 2 g via INTRAVENOUS

## 2014-11-14 MED ORDER — DEXAMETHASONE SODIUM PHOSPHATE 4 MG/ML IJ SOLN
INTRAMUSCULAR | Status: DC | PRN
  Administered 2014-11-14: 10 mg via INTRAVENOUS

## 2014-11-14 MED ORDER — HYDROCODONE-ACETAMINOPHEN 7.5-325 MG/15ML PO SOLN
10.0000 mL | ORAL | Status: DC | PRN
  Administered 2014-11-14 (×2): 15 mL via ORAL
  Filled 2014-11-14 (×2): qty 15

## 2014-11-14 MED ORDER — LIDOCAINE HCL (CARDIAC) 20 MG/ML IV SOLN
INTRAVENOUS | Status: DC | PRN
  Administered 2014-11-14: 60 mg via INTRAVENOUS

## 2014-11-14 MED ORDER — GLYCOPYRROLATE 0.2 MG/ML IJ SOLN
INTRAMUSCULAR | Status: DC | PRN
  Administered 2014-11-14: 0.2 mg via INTRAVENOUS

## 2014-11-14 MED ORDER — SUCCINYLCHOLINE CHLORIDE 20 MG/ML IJ SOLN
INTRAMUSCULAR | Status: AC
  Filled 2014-11-14: qty 2

## 2014-11-14 MED ORDER — SUFENTANIL CITRATE 50 MCG/ML IV SOLN
INTRAVENOUS | Status: AC
  Filled 2014-11-14: qty 1

## 2014-11-14 MED ORDER — LIDOCAINE-EPINEPHRINE (PF) 1 %-1:200000 IJ SOLN
INTRAMUSCULAR | Status: DC | PRN
  Administered 2014-11-14: 8 mL

## 2014-11-14 MED ORDER — FENTANYL CITRATE 0.05 MG/ML IJ SOLN: 50.0000 ug | INTRAMUSCULAR | Status: DC | PRN

## 2014-11-14 MED ORDER — SUFENTANIL CITRATE 50 MCG/ML IV SOLN
INTRAVENOUS | Status: DC | PRN
  Administered 2014-11-14: 5 ug via INTRAVENOUS
  Administered 2014-11-14: 20 ug via INTRAVENOUS

## 2014-11-14 MED ORDER — SUCCINYLCHOLINE CHLORIDE 20 MG/ML IJ SOLN
INTRAMUSCULAR | Status: DC | PRN
  Administered 2014-11-14: 120 mg via INTRAVENOUS

## 2014-11-14 MED ORDER — MIDAZOLAM HCL 2 MG/2ML IJ SOLN
INTRAMUSCULAR | Status: AC
  Filled 2014-11-14: qty 2

## 2014-11-14 MED ORDER — OXYCODONE HCL 5 MG/5ML PO SOLN: 5.0000 mg | Freq: Once | ORAL | Status: DC | PRN

## 2014-11-14 SURGICAL SUPPLY — 37 items
CANISTER SUCT 1200ML W/VALVE (MISCELLANEOUS) ×4 IMPLANT
CATH ROBINSON RED A/P 10FR (CATHETERS) ×4 IMPLANT
CLEANER CAUTERY TIP 5X5 PAD (MISCELLANEOUS) ×2 IMPLANT
COAGULATOR SUCT 6 FR SWTCH (ELECTROSURGICAL) ×1
COAGULATOR SUCT SWTCH 10FR 6 (ELECTROSURGICAL) ×3 IMPLANT
COVER MAYO STAND STRL (DRAPES) ×4 IMPLANT
DECANTER SPIKE VIAL GLASS SM (MISCELLANEOUS) IMPLANT
ELECT COATED BLADE 2.86 ST (ELECTRODE) IMPLANT
ELECT REM PT RETURN 9FT ADLT (ELECTROSURGICAL) ×4
ELECT REM PT RETURN 9FT PED (ELECTROSURGICAL)
ELECTRODE REM PT RETRN 9FT PED (ELECTROSURGICAL) IMPLANT
ELECTRODE REM PT RTRN 9FT ADLT (ELECTROSURGICAL) ×2 IMPLANT
FORCEPS TISS BAYO ENTCEPS (INSTRUMENTS) ×4 IMPLANT
GLOVE BIOGEL PI IND STRL 7.0 (GLOVE) ×2 IMPLANT
GLOVE BIOGEL PI INDICATOR 7.0 (GLOVE) ×2
GLOVE ECLIPSE 6.5 STRL STRAW (GLOVE) ×4 IMPLANT
GLOVE ECLIPSE 8.0 STRL XLNG CF (GLOVE) ×4 IMPLANT
GOWN STRL REUS W/ TWL LRG LVL3 (GOWN DISPOSABLE) ×2 IMPLANT
GOWN STRL REUS W/ TWL XL LVL3 (GOWN DISPOSABLE) ×2 IMPLANT
GOWN STRL REUS W/TWL LRG LVL3 (GOWN DISPOSABLE) ×2
GOWN STRL REUS W/TWL XL LVL3 (GOWN DISPOSABLE) ×2
MARKER SKIN DUAL TIP RULER LAB (MISCELLANEOUS) ×4 IMPLANT
NEEDLE SPNL 22GX3.5 QUINCKE BK (NEEDLE) ×4 IMPLANT
NS IRRIG 1000ML POUR BTL (IV SOLUTION) ×4 IMPLANT
PAD CLEANER CAUTERY TIP 5X5 (MISCELLANEOUS) ×2
PENCIL FOOT CONTROL (ELECTRODE) IMPLANT
SHEET MEDIUM DRAPE 40X70 STRL (DRAPES) ×4 IMPLANT
SPONGE GAUZE 4X4 12PLY STER LF (GAUZE/BANDAGES/DRESSINGS) ×4 IMPLANT
SPONGE TONSIL 1 RF SGL (DISPOSABLE) IMPLANT
SPONGE TONSIL 1.25 RF SGL STRG (GAUZE/BANDAGES/DRESSINGS) ×4 IMPLANT
SYR BULB 3OZ (MISCELLANEOUS) ×4 IMPLANT
SYR CONTROL 10ML LL (SYRINGE) ×4 IMPLANT
TOWEL OR 17X24 6PK STRL BLUE (TOWEL DISPOSABLE) ×4 IMPLANT
TUBE CONNECTING 20'X1/4 (TUBING) ×1
TUBE CONNECTING 20X1/4 (TUBING) ×3 IMPLANT
TUBE SALEM SUMP 12R W/ARV (TUBING) ×4 IMPLANT
TUBE SALEM SUMP 16 FR W/ARV (TUBING) ×4 IMPLANT

## 2014-11-14 NOTE — Interval H&P Note (Signed)
History and Physical Interval Note:  11/14/2014 8:54 AM  Audrea MuscatEmily R Sahota  has presented today for surgery, with the diagnosis of CHRONIC TONSILLITIS  The various methods of treatment have been discussed with the patient and family. After consideration of risks, benefits and other options for treatment, the patient has consented to  Procedure(s): BILATERAL TONSILLECTOMY (Bilateral) POSSIBLE BILATERAL ADENOIDECTOMY (Bilateral) as a surgical intervention .  The patient's history has been re-reviewed, patient re-examined, no change in status, stable for surgery.  I have re-reviewed the patient's chart and labs.  Questions were answered to the patient's satisfaction.    Low hemoglobin noted.  No hx unusually heavy periods or other blood loss.  Possibly from chronic disease.  Will recheck hemoglobin in 2 mos and ask for additional help if still low.  Pt and mother understand and agree.   Flo ShanksWOLICKI, Hau Sanor

## 2014-11-14 NOTE — Discharge Instructions (Signed)
Tonsillectomy A tonsillectomy is a surgery to remove your tonsils. Tonsils are lymph tissues at the back and upper part of your throat. Because tonsils can collect debris, they can become infected. Tonsillectomy often is done when nonsurgical treatments have been unsuccessful in resolving problems with tonsils.  LET [facility] PROVIDER KNOW ABOUT:  Any allergies you have.  All medicines you are taking, including vitamins, herbs, eye drops, creams, and over-the-counter medicines, especially those containing aspirin or ibuprofen.  Previous problems you or members of your family have had with the use of anesthetics.  Any blood disorders you have.  Previous surgeries you have had.  Medical conditions you have.  Recent cough or fever you have had. RISKS AND COMPLICATIONS Generally, tonsillectomy is a safe procedure. However, as with any procedure, problems can occur. Possible problems include:  Bleeding.  Infection.  Scarring.  Changes in your sense of taste.  Changes in your voice.  Changes in swallowing. BEFORE THE PROCEDURE  Do not take any aspirin, ibuprofen, or any medicines that may contain these agents for 2 weeks before the procedure.  Do not eat or drink after midnight the night before the procedure. PROCEDURE   You will be given a medicine that makes you go to sleep (general anesthetic).  A device will be placed inside of your mouth that presses your tongue down so that the tonsils at the back of your throat can be removed without cuts on the outside of your neck or throat.  Your tonsils will typically be removed with a device called an electrocautery, which will cut your tonsils out and then shrink the surrounding blood vessels at the same time so that you will not bleed after the procedure.  Usually, stitches will not be used to close the cut. A white scab (eschar) will form in the area where your tonsils used to be. AFTER THE PROCEDURE After surgery, you  will be taken to a recovery area for close monitoring. Once you are awake, stable, and taking fluids well, you will be allowed to go home.  Document Released: 02/28/2001 Document Revised: 11/21/2013 Document Reviewed: 06/13/2013 [facility] Patient Information 2015 [place], [place]. This information is not intended to replace advice given to you by your health care provider. Make sure you discuss any questions you have with your health care provider.  See our tonsillectomy instructions from the office please Call for bleeding, any breathing difficulty, poor pain relief, inability to swallow, high fevers (102 or more)  [phone] for my office. Post Anesthesia Home Care Instructions  Activity: Get plenty of rest for the remainder of the day. A responsible adult should stay with you for 24 hours following the procedure.  For the next 24 hours, DO NOT: -Drive a car -Advertising copywriterperate machinery -Drink alcoholic beverages -Take any medication unless instructed by your physician -Make any legal decisions or sign important papers.  Meals: Start with liquid foods such as gelatin or soup. Progress to regular foods as tolerated. Avoid greasy, spicy, heavy foods. If nausea and/or vomiting occur, drink only clear liquids until the nausea and/or vomiting subsides. Call your physician if vomiting continues.  Special Instructions/Symptoms: Your throat may feel dry or sore from the anesthesia or the breathing tube placed in your throat during surgery. If this causes discomfort, gargle with warm salt water. The discomfort should disappear within 24 hours.

## 2014-11-14 NOTE — Transfer of Care (Signed)
Immediate Anesthesia Transfer of Care Note  Patient: Cheryl Adams  Procedure(s) Performed: Procedure(s): BILATERAL TONSILLECTOMY AND ADENOIDECTOMY (Bilateral)  Patient Location: PACU  Anesthesia Type:General  Level of Consciousness: awake, alert  and oriented  Airway & Oxygen Therapy: Patient Spontanous Breathing and aerosol face mask  Post-op Assessment: Report given to PACU RN and Post -op Vital signs reviewed and stable  Post vital signs: Reviewed and stable  Complications: No apparent anesthesia complications

## 2014-11-14 NOTE — Anesthesia Postprocedure Evaluation (Signed)
  Anesthesia Post-op Note  Patient: Cheryl Adams R Kilpatrick  Procedure(s) Performed: Procedure(s): BILATERAL TONSILLECTOMY AND ADENOIDECTOMY (Bilateral)  Patient Location: PACU  Anesthesia Type:General  Level of Consciousness: awake and alert   Airway and Oxygen Therapy: Patient Spontanous Breathing  Post-op Pain: mild  Post-op Assessment: Post-op Vital signs reviewed, Patient's Cardiovascular Status Stable and Respiratory Function Stable  Post-op Vital Signs: Reviewed  Filed Vitals:   11/14/14 1111  BP:   Pulse: 68  Temp:   Resp: 22    Complications: No apparent anesthesia complications

## 2014-11-14 NOTE — Anesthesia Procedure Notes (Signed)
Procedure Name: Intubation Date/Time: 11/14/2014 9:07 AM Performed by: Zenia ResidesPAYNE, Arushi Partridge D Pre-anesthesia Checklist: Patient identified, Emergency Drugs available, Suction available and Patient being monitored Patient Re-evaluated:Patient Re-evaluated prior to inductionOxygen Delivery Method: Circle System Utilized Preoxygenation: Pre-oxygenation with 100% oxygen Intubation Type: IV induction Ventilation: Mask ventilation without difficulty Laryngoscope Size: Mac and 3 Grade View: Grade I Tube type: Oral Number of attempts: 1 Airway Equipment and Method: stylet and oral airway Placement Confirmation: ETT inserted through vocal cords under direct vision,  positive ETCO2 and breath sounds checked- equal and bilateral Secured at: 22 cm Tube secured with: Tape Dental Injury: Teeth and Oropharynx as per pre-operative assessment

## 2014-11-14 NOTE — Anesthesia Preprocedure Evaluation (Addendum)
Anesthesia Evaluation  Patient identified by MRN, date of birth, ID band Patient awake    Reviewed: Allergy & Precautions, H&P , NPO status , Patient's Chart, lab work & pertinent test results  Airway Mallampati: I  TM Distance: >3 FB Neck ROM: Full    Dental no notable dental hx. (+) Teeth Intact, Dental Advisory Given   Pulmonary neg pulmonary ROS,  breath sounds clear to auscultation  Pulmonary exam normal       Cardiovascular negative cardio ROS  Rhythm:Regular Rate:Normal     Neuro/Psych negative neurological ROS  negative psych ROS   GI/Hepatic negative GI ROS, Neg liver ROS,   Endo/Other  negative endocrine ROSMorbid obesity  Renal/GU negative Renal ROS  negative genitourinary   Musculoskeletal   Abdominal   Peds  Hematology negative hematology ROS (+)   Anesthesia Other Findings   Reproductive/Obstetrics negative OB ROS                            Anesthesia Physical Anesthesia Plan  ASA: II  Anesthesia Plan: General   Post-op Pain Management:    Induction: Intravenous  Airway Management Planned: Oral ETT  Additional Equipment:   Intra-op Plan:   Post-operative Plan: Extubation in OR  Informed Consent: I have reviewed the patients History and Physical, chart, labs and discussed the procedure including the risks, benefits and alternatives for the proposed anesthesia with the patient or authorized representative who has indicated his/her understanding and acceptance.   Dental advisory given  Plan Discussed with: CRNA  Anesthesia Plan Comments:         Anesthesia Quick Evaluation

## 2014-11-14 NOTE — Op Note (Signed)
11/14/2014  10:10 AM    Maurene CapesKeel, Avin  [id number]   Pre-Op Dx:  Chronic recurrent tonsillitis  Post-op Dx: same  Proc: tonsillectomy, adenoid ablation   Surg:  Flo ShanksWOLICKI, Coni Homesley T MD  Anes:  GOT  EBL:  min  Comp:   none  Findings:  3+ protruding tonsils.  Nl soft palate.  Congested inferior turbinates.  Narrow NP with mod residual adenoid tissue.  Procedure:  With the patient in a comfortable supine position,  general orotracheal anesthesia was induced without difficulty.  At an appropriate level, the patient was turned 90 away from anesthesia and placed in Trendelenburg.  A clean preparation and draping was accomplished.  Taking care to protect lips, teeth, and endotracheal tube, the Crowe-Davis mouth gag was introduced, expanded for visualization, and suspended from the Mayo stand in the standard fashion.  The findings were as described above.  Palate  retractor  and mirror were used to examine the nasopharynx with the findings as described above.   Anterior nose was examined with a nasal speculum with the findings as described above.  1% Xylocaine with 1:200,000 epinephrine, 8 cc's, was infiltrated into the peritonsillar planes on both sides for intraoperative hemostasis.  Several minutes were allowed for this to take effect.   Beginning on the  RIGHT side, the tonsil was grasped and retracted medially.  The mucosa over the anterior and superior poles was coagulated and then cut down to the capsule of the tonsil using the Medline thermal forceps.    Using the forceps tip as a blunt dissector, the tonsil was dissected from its muscular fossa from anterior to posterior and from superior to inferior.  Fibrous bands were lysed as necessary.  Crossing vessels were coagulated as identified.  The tonsil was removed in its entirety as determined by examination of both tonsil and fossa.  A small additional quantity of cautery rendered the fossa hemostatic.    After completing the 1st  tonsillectomy, the 2nd one was performed in identical fashion.  After completing both tonsillectomies and rendering the oropharynx hemostatic, a red rubber catheter was placed through the nose and out of the mouth to serve as a palate retractor.  Using suction cautery, the adenoid pad including choanal tags and lateral bands was ablated.   Upon achieving hemostasis in the nasopharynx, the oropharynx was again observed to be hemostatic.    At this point the palate retractor and mouthgag were relaxed for several minutes.  Upon reexpansion,  Hemostasis was observed.  An orogastric tube was briefly placed and a small amount of clear secretions was evacuated.  This tube was removed.  The mouth gag and palate retractor were relaxed and removed.  The dental status is intact.   At this point the procedure was completed.  The patient was returned to anesthesia, awakened, extubated, and transferred to recovery in stable condition.   Dispo:  OR to PACU.   Will observe for six hours, overnight if necessary and then discharged to home in care of family.  Plan:  Analgesia, hydration, limited activity for two weeks.  Advance diet as comfortable.  Return to school or work at 10 days.  Cephus RicherWOLICKI,  Vasili Fok T.  MD.

## 2014-11-14 NOTE — H&P (View-Only) (Signed)
Adams,  Cheryl 23 y.o., female [id number]     Chief Complaint: Recurrent sore throats  HPI: 23-year-old black female comes in for evaluation of recurrent sore throats/tonsillitis.  She had tubes in her infancy which are long gone and  her ears are doing well.  For the past several years, she has had frequent recurrent episodes of tonsillitis with large tonsils.  None of these have been strep positive.  She typically is treated with antibiotics and responds promptly.  This past weekend, her throat was sore to the point where her face and neck seemed swollen.  She typically gets increased snoring, headache, ear pain, and nasal congestion along with the tonsillitis.  She does not smoke.  Is not clear that she has sleep apnea.  She has never had mononucleosis to her knowledge.   We decided to proceed with tonsillectomy, possible adenoidectomy.  I discussed the surgery with her in detail including risks and complications.  Questions were answered and informed consent was obtained.  I discussed advancement of diet and activity including return to work and school.  Prescriptions for oxycodone liquid and hydrocodone liquid were written and given.  I will see her back 10 days after surgery.  PMH: Past Medical History  Diagnosis Date  . Chronic tonsillitis 10/2014  . Cough 11/09/2014  . Difficulty swallowing pills     Surg Hx: Past Surgical History  Procedure Laterality Date  . Tympanostomy tube placement      FHx:  No family history on file. SocHx:  reports that she has never smoked. She has never used smokeless tobacco. She reports that she does not drink alcohol or use illicit drugs.  ALLERGIES: No Known Allergies   (Not in a hospital admission)  No results found for this or any previous visit (from the past 48 hour(s)). No results found.  ROS:Systemic: Feeling tired (fatigue).  No fever, no night sweats, and no recent weight loss. Eyes: No eye symptoms. Otolaryngeal: No hearing loss.   Earache.  No tinnitus.  Purulent nasal discharge.  No nasal passage blockage (stuffiness).  Snoring.  No sneezing.  Hoarseness  and sore throat. Cardiovascular: No chest pain or discomfort  and no palpitations. Pulmonary: No dyspnea.  Cough.  No wheezing. Gastrointestinal: No dysphagia  and no heartburn.  No nausea, no abdominal pain, and no melena.  No diarrhea. Genitourinary: No dysuria. Endocrine: No muscle weakness. Musculoskeletal: No calf muscle cramps, no arthralgias, and no soft tissue swelling. Neurological: No dizziness, no fainting, no tingling, and no numbness. Psychological: No anxiety  and no depression. Skin: No rash.  Last menstrual period 10/26/2014.   BP:128/76,  HR: 82 b/min,  Height: 5 ft 5.5 in, Weight: 223 lb , BMI: 36.5 kg/m2,   PHYSICAL EXAM: She is heavyset.  She is quite animated.  Mental status is intact.  She appears well in conversational speech.  Voice is clear and respirations unlabored through nose and mouth.  No velopharyngeal insufficiency.  The head is atraumatic and neck supple.  Cranial nerves intact.  Ear canals are clear with normal drums.  Anterior nose mildly congested.  Oral cavity reveals teeth in good repair.  Oropharynx shows bulky 3+ tonsils with active exudate on both sides.  Normal soft palate.  Neck with some mild tender bilateral jugulodigastric adenopathy.   Lungs: Clear to auscultation Heart: Regular rate and rhythm without murmurs Abdomen: Soft, active Extremities: Normal configuration Neurologic: Symmetric, grossly intact.    Assessment/Plan Tonsillitis, chronic (474.00) (J35.01  You're having too many   episodes of tonsillitis.  I do recommend that we take your tonsils out, and any adenoids that may remain.  No strenuous activities for 2 weeks after surgery.  I am leaving you prescriptions for liquid oxycodone which is a strong narcotic pain medication, and hydrocodone liquid narcotic pain medication which is lesser strength.  I will  plan on seeing you back 10 days after surgery.  See our tonsillectomy instructions.  OxyCODONE HCl - 5 MG/5ML Oral Solution;5-10 ml po q4h prn severe pain; Qty300; R0; Rx. Hydrocodone-Acetaminophen 7.5-325 MG/15ML Oral Solution;10-20 ml po q4-6h prn pain; Qty400; R0; Rx  Flo ShanksWOLICKI, Tarron Krolak 11/13/2014, 1:13 PM

## 2014-11-15 ENCOUNTER — Encounter (HOSPITAL_BASED_OUTPATIENT_CLINIC_OR_DEPARTMENT_OTHER): Payer: Self-pay | Admitting: Otolaryngology

## 2015-01-15 ENCOUNTER — Emergency Department (HOSPITAL_COMMUNITY)
Admission: EM | Admit: 2015-01-15 | Discharge: 2015-01-15 | Disposition: A | Discharge status: Home / Self Care | Payer: BLUE CROSS/BLUE SHIELD | Source: Home / Self Care | Attending: Family Medicine | Admitting: Family Medicine

## 2015-01-15 ENCOUNTER — Encounter (HOSPITAL_COMMUNITY): Payer: Self-pay | Admitting: *Deleted

## 2015-01-15 DIAGNOSIS — J312 Chronic pharyngitis: Secondary | ICD-10-CM

## 2015-01-15 MED ORDER — CLINDAMYCIN HCL 300 MG PO CAPS: 300.0000 mg | ORAL_CAPSULE | Freq: Three times a day (TID) | ORAL | Status: DC

## 2015-01-15 NOTE — Discharge Instructions (Signed)
Drink plenty of fluids as discussed, use medicine as prescribed, and mucinex or delsym for cough. see your doctor if further problems °

## 2015-01-15 NOTE — ED Notes (Signed)
Pt  Reports     Headache          Body  Aches      Fatigue           X 2-3   Weeks             Pt  Is  Sitting  Upright on  The  Exam table        Speaking  In   Complete  sentances   And  Is            In no  Acute   Distress

## 2015-01-15 NOTE — ED Provider Notes (Signed)
CSN: [id number]     Arrival date & time 01/15/15  1519 History   First MD Initiated Contact with Patient 01/15/15 1702     Chief Complaint  Patient presents with  . Generalized Body Aches   (Consider location/radiation/quality/duration/timing/severity/associated sxs/prior Treatment) Patient is a 24 y.o. female presenting with URI. The history is provided by the patient.  URI Presenting symptoms: congestion, cough and rhinorrhea   Presenting symptoms: no fever and no sore throat   Severity:  Mild Onset quality:  Gradual Duration:  3 weeks Chronicity:  New Relieved by:  None tried Worsened by:  Nothing tried Ineffective treatments:  None tried Associated symptoms: myalgias   Risk factors: recent illness   Risk factors comment:  S/p tonsillectomy in dec 2015   Past Medical History  Diagnosis Date  . Chronic tonsillitis 10/2014  . Cough 11/09/2014  . Difficulty swallowing pills    Past Surgical History  Procedure Laterality Date  . Tympanostomy tube placement    . Tonsillectomy and adenoidectomy Bilateral 11/14/2014    Procedure: BILATERAL TONSILLECTOMY AND ADENOIDECTOMY;  Surgeon: Flo ShanksKarol Wolicki, MD;  Location: [facility];  Service: ENT;  Laterality: Bilateral;   History reviewed. No pertinent family history. History  Substance Use Topics  . Smoking status: Never Smoker   . Smokeless tobacco: Never Used  . Alcohol Use: No   OB History    No data available     Review of Systems  Constitutional: Negative.  Negative for fever.  HENT: Positive for congestion and rhinorrhea. Negative for sore throat.   Respiratory: Positive for cough.   Musculoskeletal: Positive for myalgias.    Allergies  Review of patient's allergies indicates no known allergies.  Home Medications   Prior to Admission medications   Medication Sig Start Date End Date Taking? Authorizing Provider  clindamycin (CLEOCIN) 300 MG capsule Take 1 capsule (300 mg total) by mouth 3 (three)  times daily. 01/15/15   Linna HoffJames D Pierce Biagini, MD   BP 112/67 mmHg  Pulse 63  Temp(Src) 98.4 F (36.9 C) (Oral)  Resp 16  SpO2 100%  LMP 12/30/2014 Physical Exam  Constitutional: She is oriented to person, place, and time. She appears well-developed and well-nourished.  HENT:  Right Ear: External ear normal.  Left Ear: External ear normal.  Mouth/Throat: Oropharynx is clear and moist.  Eyes: Pupils are equal, round, and reactive to light.  Neck: Normal range of motion. Neck supple.  Cardiovascular: Normal heart sounds.   Pulmonary/Chest: Effort normal and breath sounds normal.  Lymphadenopathy:    She has no cervical adenopathy.  Neurological: She is alert and oriented to person, place, and time.  Skin: Skin is warm and dry.  Nursing note and vitals reviewed.   ED Course  Procedures (including critical care time) Labs Review Labs Reviewed - No data to display  Imaging Review No results found.   MDM   1. Chronic pharyngitis        Linna HoffJames D Gaylyn Berish, MD 01/15/15 2049

## 2015-02-20 ENCOUNTER — Ambulatory Visit: Payer: BLUE CROSS/BLUE SHIELD | Admitting: Internal Medicine

## 2015-02-26 ENCOUNTER — Ambulatory Visit (INDEPENDENT_AMBULATORY_CARE_PROVIDER_SITE_OTHER): Payer: BLUE CROSS/BLUE SHIELD | Admitting: Internal Medicine

## 2015-02-26 ENCOUNTER — Encounter: Payer: Self-pay | Admitting: Internal Medicine

## 2015-02-26 VITALS — BP 127/65 | HR 78 | Temp 98.6°F | Ht 65.2 in | Wt 214.3 lb

## 2015-02-26 DIAGNOSIS — D509 Iron deficiency anemia, unspecified: Secondary | ICD-10-CM | POA: Insufficient documentation

## 2015-02-26 DIAGNOSIS — Z0000 Encounter for general adult medical examination without abnormal findings: Secondary | ICD-10-CM

## 2015-02-26 DIAGNOSIS — Z9889 Other specified postprocedural states: Secondary | ICD-10-CM | POA: Diagnosis not present

## 2015-02-26 DIAGNOSIS — Z9089 Acquired absence of other organs: Secondary | ICD-10-CM

## 2015-02-26 LAB — ANEMIA PANEL
%SAT: 3 % — ABNORMAL LOW (ref 20–55)
ABS RETIC: 57.6 10*3/uL (ref 19.0–186.0)
FERRITIN: 3 ng/mL — AB (ref 10–291)
Folate: 17 ng/mL
Iron: 13 ug/dL — ABNORMAL LOW (ref 42–145)
RBC.: 5.24 MIL/uL — ABNORMAL HIGH (ref 3.87–5.11)
Retic Ct Pct: 1.1 % (ref 0.4–2.3)
TIBC: 437 ug/dL (ref 250–470)
UIBC: 424 ug/dL — ABNORMAL HIGH (ref 125–400)
Vitamin B-12: 578 pg/mL (ref 211–911)

## 2015-02-26 LAB — COMPLETE METABOLIC PANEL WITH GFR
ALK PHOS: 53 U/L (ref 39–117)
ALT: 9 U/L (ref 0–35)
AST: 15 U/L (ref 0–37)
Albumin: 4.2 g/dL (ref 3.5–5.2)
BILIRUBIN TOTAL: 1.2 mg/dL (ref 0.2–1.2)
BUN: 7 mg/dL (ref 6–23)
CALCIUM: 10 mg/dL (ref 8.4–10.5)
CHLORIDE: 105 meq/L (ref 96–112)
CO2: 26 meq/L (ref 19–32)
Creat: 0.67 mg/dL (ref 0.50–1.10)
GLUCOSE: 87 mg/dL (ref 70–99)
POTASSIUM: 4.4 meq/L (ref 3.5–5.3)
SODIUM: 139 meq/L (ref 135–145)
Total Protein: 7.6 g/dL (ref 6.0–8.3)

## 2015-02-26 LAB — CBC WITH DIFFERENTIAL/PLATELET
BASOS ABS: 0 10*3/uL (ref 0.0–0.1)
Basophils Relative: 1 % (ref 0–1)
EOS PCT: 2 % (ref 0–5)
Eosinophils Absolute: 0.1 10*3/uL (ref 0.0–0.7)
HCT: 32 % — ABNORMAL LOW (ref 36.0–46.0)
HEMOGLOBIN: 9 g/dL — AB (ref 12.0–15.0)
LYMPHS ABS: 1.4 10*3/uL (ref 0.7–4.0)
Lymphocytes Relative: 32 % (ref 12–46)
MCH: 17.2 pg — AB (ref 26.0–34.0)
MCHC: 28.1 g/dL — ABNORMAL LOW (ref 30.0–36.0)
MCV: 61.1 fL — AB (ref 78.0–100.0)
MONO ABS: 0.4 10*3/uL (ref 0.1–1.0)
Monocytes Relative: 8 % (ref 3–12)
Neutro Abs: 2.6 10*3/uL (ref 1.7–7.7)
Neutrophils Relative %: 57 % (ref 43–77)
Platelets: 450 10*3/uL — ABNORMAL HIGH (ref 150–400)
RBC: 5.24 MIL/uL — ABNORMAL HIGH (ref 3.87–5.11)
RDW: 20.4 % — AB (ref 11.5–15.5)
WBC: 4.5 10*3/uL (ref 4.0–10.5)

## 2015-02-26 LAB — HIV ANTIBODY (ROUTINE TESTING W REFLEX): HIV: NONREACTIVE

## 2015-02-26 LAB — POCT URINE PREGNANCY: Preg Test, Ur: NEGATIVE

## 2015-02-26 LAB — TSH: TSH: 1.352 u[IU]/mL (ref 0.350–4.500)

## 2015-02-26 NOTE — Progress Notes (Signed)
Patient ID: Cheryl Adams, female   DOB: 23-Dec-1990, 23 y.o.   MRN: [id number]    Subjective:   Patient ID: Cheryl Muscatmily R Brisbin female   DOB: 23-Dec-1990 23 y.o.   MRN: [id number]  HPI: Ms.Yassmine R Carolin GuernseyKeel is a 24 y.o. woman with past medical history of unspecified anemia and recent tonsillectomy/adenoidectomy who presents as new patient to establish care.   She reports have chronic tonsillitis requiring tonsillectomy/adenoidectomy in December 2015. She reports doing well after the surgery with no further sore throat and has follow-up appointment with ENT next month.   She had Hg of 8.2 on 11/15/15 with no prior CBC or anemia panel. She reports chronic fatigue, lightheadedness, feeling cold, dry skin, LE muscle cramping, and craving for ice. She reports having heavy, irregular (recenty), and sometimes painful periods. She has never had pelvic/transvaginal US before. She had pap smear testing in 2009 that was normal as well as STD testing. She declines pap smear testing today. She denies BRBPR or hematochezia.   She is not currently taking any prescription or OTC medications. She has one sister and family history of breast cancer in her Aunt (maternal). She has never smoked and does not drink alcohol or use recreational drugs. She is not sexually active and is not on birth control. 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      Past Medical History  Diagnosis Date  . Chronic tonsillitis 10/2014  . Cough 11/09/2014  . Difficulty swallowing pills    Current Outpatient Prescriptions  Medication Sig Dispense Refill  . clindamycin (CLEOCIN) 300 MG capsule Take 1 capsule (300 mg total) by mouth 3 (three) times daily. 21 capsule 0   No current facility-administered medications for this visit.   No family history on file. History   Social History  . Marital Status: Single    Spouse Name: N/A  . Number of Children: N/A  . Years of Education: N/A   Social History Main Topics  .  Smoking status: Never Smoker   . Smokeless tobacco: Never Used  . Alcohol Use: No  . Drug Use: No  . Sexual Activity: Not on file   Other Topics Concern  . None   Social History Narrative   Review of Systems: Review of Systems  Constitutional: Positive for malaise/fatigue (chronic). Negative for fever, chills and weight loss.       Chronically feeling cold. PICA (ice).   HENT: Negative for congestion and sore throat.   Eyes: Negative for blurred vision.       Wears glasses  Respiratory: Negative for cough, shortness of breath and wheezing.   Cardiovascular: Negative for chest pain and leg swelling.  Gastrointestinal: Negative for heartburn, nausea, vomiting, abdominal pain, diarrhea, constipation and blood in stool.  Genitourinary: Negative for dysuria, urgency, frequency and hematuria.  Musculoskeletal: Positive for myalgias (UE and LE) and back pain (history of ). Negative for joint pain.  Skin: Positive for itching (bumps on thigh) and rash (pruritic bumps on thighs).       Dry skin  Neurological: Positive for dizziness (lightheadedness with standing occasionally ) and headaches (tension type before menstrual period).  Endo/Heme/Allergies: Negative for environmental allergies.  Psychiatric/Behavioral: Negative for substance abuse.    Objective:  Physical Exam: Filed Vitals:   02/26/15 1028  BP: 127/65  Pulse: 78  Temp: 98.6 F (37 C)  TempSrc: Oral  Height: 5' 5.2" (1.656 m)  Weight: 214 lb 4.8 oz (97.206 kg)  SpO2: 100%    Physical Exam  Constitutional: She is oriented to person, place, and time. She appears well-developed and well-nourished. No distress.  HENT:  Head: Normocephalic and atraumatic.  Right Ear: External ear normal.  Left Ear: External ear normal.  Nose: Nose normal.  Mouth/Throat: Oropharynx is clear and moist. No oropharyngeal exudate.  Eyes: Conjunctivae and EOM are normal. Pupils are equal, round, and reactive to light. Right eye exhibits no  discharge. Left eye exhibits no discharge. No scleral icterus.  Neck: Normal range of motion. Neck supple.  Cardiovascular: Normal rate, regular rhythm and normal heart sounds.   No murmur heard. Pulmonary/Chest: Effort normal and breath sounds normal. No respiratory distress. She has no wheezes. She has no rales.  Abdominal: Soft. Bowel sounds are normal. She exhibits no distension. There is no tenderness. There is no rebound and no guarding.  Musculoskeletal: Normal range of motion. She exhibits no edema or tenderness.  Neurological: She is alert and oriented to person, place, and time.  Skin: Skin is warm and dry. No rash noted. She is not diaphoretic. No erythema. No pallor.  Psychiatric: She has a normal mood and affect. Her behavior is normal. Judgment and thought content normal.    Assessment & Plan:   Please see problem list for problem-based assessment and plan

## 2015-02-26 NOTE — Patient Instructions (Signed)
-  Will check your bloodwork today and call you with the results  -Take over the counter naproxen as needed for headache -Please come back for a pap smear, very nice meeting you today!    General Instructions:   Thank you for bringing your medicines today. This helps us keep you safe from mistakes.   Progress Toward Treatment Goals:  No flowsheet data found.  Self Care Goals & Plans:  No flowsheet data found.  No flowsheet data found.   Care Management & Community Referrals:  No flowsheet data found.

## 2015-02-27 LAB — URINE CYTOLOGY ANCILLARY ONLY
Chlamydia: NEGATIVE
Neisseria Gonorrhea: NEGATIVE

## 2015-02-27 MED ORDER — FERROUS SULFATE 325 (65 FE) MG PO TABS
325.0000 mg | ORAL_TABLET | Freq: Three times a day (TID) | ORAL | Status: DC
Start: 2015-02-27 — End: 2018-06-02

## 2015-02-27 MED ORDER — DOCUSATE SODIUM 100 MG PO CAPS: 100.0000 mg | ORAL_CAPSULE | Freq: Every day | ORAL | Status: AC | PRN

## 2015-02-27 NOTE — Assessment & Plan Note (Addendum)
Assessment: Pt with hemoglobin of 8.2 on 11/14/14 with no prior CBC or anemia panel who presents with symptoms of chronic anemia most likely due to menorrhagia of unclear etiology.    Plan:  -Obtain CBC w/diff ----> Hg 9 MCV 61.1 (microcytic anemia) with 450K platelet count (reactive to iron-deficiency)  -Obtain anemia panel ---> ferritin very low at 3, TIBC high normal 430, iron low at 13, iron sat low at 3% consistent with iron-deficiency    -Obtain TSH level ---> normal -Obtain CMP ---> normal  -Called pt on 02/27/15 and instructed her to start taking oral ferrous sulfate 325 mg TID with colace 100 mg PRN constipation which she verbalized understanding. She declined gynecology referral at this time. Consider pelvic/transvaginal US to assess for uterine fibroids if not agreeable to gynecology referral.   -Pt to return in 3 months for repeat CBC and iron studies to assess response to oral iron therapy

## 2015-02-27 NOTE — Assessment & Plan Note (Addendum)
-  Pt declined pap smear, will return for one  -Pt declined tdap (may already have had) and annual influenza vaccination -Obtain urine pregnancy test and urine gonorrhea and chlamydia  screening ----> negative -Obtain annual HIV Ab screening ----> non-reactive

## 2015-02-27 NOTE — Assessment & Plan Note (Signed)
Assessment: Pt status post tonsillectomy and adenoidectomy December 2015 in setting of chronic tonsillitis who presents with no further sore throat.   Plan:  -Pt to follow-up with ENT next month

## 2015-02-28 NOTE — Progress Notes (Signed)
Case discussed with Dr. Rabbani soon after the resident saw the patient.  We reviewed the resident's history and exam and pertinent patient test results.  I agree with the assessment, diagnosis, and plan of care documented in the resident's note. 

## 2015-12-03 ENCOUNTER — Ambulatory Visit (INDEPENDENT_AMBULATORY_CARE_PROVIDER_SITE_OTHER): Payer: BLUE CROSS/BLUE SHIELD | Admitting: Emergency Medicine

## 2015-12-03 VITALS — BP 102/74 | HR 53 | Temp 98.5°F | Resp 16 | Ht 65.0 in | Wt 238.0 lb

## 2015-12-03 DIAGNOSIS — Z111 Encounter for screening for respiratory tuberculosis: Secondary | ICD-10-CM

## 2015-12-03 NOTE — Progress Notes (Signed)
Subjective:  Patient ID: [name] R Napolitano, female    DOB: 1990-12-21  Age: 25 y.o. MRN: [id number]  CC: Immunizations   HPI [name] R Hard presents  patient is here requesting a tuberculosis skin test she's had several in the past involving negative she has no recent fever chills or night sweats  History [name] has a past medical history of Chronic tonsillitis (10/2014); Cough (11/09/2014); and Difficulty swallowing pills.   She has past surgical history that includes Tympanostomy tube placement and Tonsillectomy and adenoidectomy (Bilateral, 11/14/2014).   Her  family history is not on file.  She   reports that she has never smoked. She has never used smokeless tobacco. She reports that she does not drink alcohol or use illicit drugs.  Outpatient Prescriptions Prior to Visit  Medication Sig Dispense Refill  . ferrous sulfate 325 (65 FE) MG tablet Take 1 tablet (325 mg total) by mouth 3 (three) times daily with meals. 30 tablet 3  . docusate sodium (COLACE) 100 MG capsule Take 1 capsule (100 mg total) by mouth daily as needed. (Patient not taking: Reported on 12/03/2015)     No facility-administered medications prior to visit.    Social History   Social History  . Marital Status: Single    Spouse Name: N/A  . Number of Children: N/A  . Years of Education: N/A   Social History Main Topics  . Smoking status: Never Smoker   . Smokeless tobacco: Never Used  . Alcohol Use: No  . Drug Use: No  . Sexual Activity: Not Asked   Other Topics Concern  . None   Social History Narrative     Review of Systems  Constitutional: Negative for fever, chills and appetite change.  HENT: Negative for congestion, ear pain, postnasal drip, sinus pressure and sore throat.   Eyes: Negative for pain and redness.  Respiratory: Negative for cough, shortness of breath and wheezing.   Cardiovascular: Negative for leg swelling.  Gastrointestinal: Negative for nausea, vomiting, abdominal pain,  diarrhea, constipation and blood in stool.  Endocrine: Negative for polyuria.  Genitourinary: Negative for dysuria, urgency, frequency and flank pain.  Musculoskeletal: Negative for gait problem.  Skin: Negative for rash.  Neurological: Negative for weakness and headaches.  Psychiatric/Behavioral: Negative for confusion and decreased concentration. The patient is not nervous/anxious.     Objective:  BP 102/74 mmHg  Pulse 53  Temp(Src) 98.5 F (36.9 C)  Resp 16  Ht 5\' 5"  (1.651 m)  Wt 238 lb (107.956 kg)  BMI 39.61 kg/m2  SpO2 99%  Physical Exam  Constitutional: She is oriented to person, place, and time. She appears well-developed and well-nourished.  HENT:  Head: Normocephalic and atraumatic.  Eyes: Conjunctivae are normal. Pupils are equal, round, and reactive to light.  Pulmonary/Chest: Effort normal.  Musculoskeletal: She exhibits no edema.  Neurological: She is alert and oriented to person, place, and time.  Skin: Skin is dry.  Psychiatric: She has a normal mood and affect. Her behavior is normal. Thought content normal.      Assessment & Plan:   [name] was seen today for immunizations.  Diagnoses and all orders for this visit:  Screening-pulmonary TB -     TB Skin Test  I am having Ms. Carolin GuernseyKeel maintain her ferrous sulfate and docusate sodium.  No orders of the defined types were placed in this encounter.    Appropriate red flag conditions were discussed with the patient as well as actions that should be taken.  Patient expressed his understanding.  Follow-up: Return if symptoms worsen or fail to improve.  Roselee Culver, MD

## 2015-12-03 NOTE — Progress Notes (Signed)
  Tuberculosis Risk Questionnaire  1. No Were you born outside the [place] in one of the following parts of the world: [place], [place], [place], [place] or [place]?    2. No Have you traveled outside the [place] and lived for more than one month in one of the following parts of the world: [place], [place], [place], [place] or [place]?    3. No Do you have a compromised immune system such as from any of the following conditions:HIV/AIDS, organ or bone marrow transplantation, diabetes, immunosuppressive medicines (e.g. Prednisone, Remicaide), leukemia, lymphoma, cancer of the head or neck, gastrectomy or jejunal bypass, end-stage renal disease (on dialysis), or silicosis?     4. No Have you ever or do you plan on working in: a residential care center, a health care facility, a jail or prison or homeless shelter?    5. No Have you ever: injected illegal drugs, used crack cocaine, lived in a homeless shelter  or been in jail or prison?     6. No Have you ever been exposed to anyone with infectious tuberculosis?    Tuberculosis Symptom Questionnaire  Do you currently have any of the following symptoms?  1. No Unexplained cough lasting more than 3 weeks?   2. No Unexplained fever lasting more than 3 weeks.   3. No Night Sweats (sweating that leaves the bedclothes and sheets wet)     4. No Shortness of Breath   5. No Chest Pain   6. No Unintentional weight loss    7. No Unexplained fatigue (very tired for no reason)   

## 2015-12-03 NOTE — Patient Instructions (Signed)
Tuberculin Skin Test WHY AM I HAVING THIS TEST? Tuberculosis (TB) is a bacterial infection caused by Mycobacterium tuberculosis. Most people who are exposed to these bacteria have a strong enough defense (immune) system to prevent the bacteria from causing TB and developing symptoms. Their bodies prevent the germs from being active and making them sick (latent TB infection).  However, if you have TB germs in your body and your immune system is weak, you can develop a TB infection. This can cause symptoms such as:   Night sweats.  Fever.  Weakness.  Weight loss. A latent TB infection can also become active later in life if your immune system becomes weakened or compromised. You may have this test if your health care provider suspects that you have TB. You may also have this test to screen for TB if you are at risk for getting the disease. Those at increased risk include:  People who inject illegal drugs or share needles.  People with HIV or other diseases that affect immunity.  Health care workers.  People who live in high-risk communities, such as homeless shelters, nursing homes, and correctional facilities.  People who have been in contact with someone with TB.  People from countries where TB is more common. If you are in a high-risk group, your health care provider may wish to screen for TB more often. This can help prevent the spread of the disease. Sometimes TB screening is required when starting a new job, such as becoming a health care worker or a teacher. Colleges or universities may require it of new students. HOW WILL I BE TESTED? A tuberculin skin test is the main test used to check for exposure to the bacteria that can cause TB. The test checks for antibodies to the bacteria. Antibodies are proteins that your body produces to protect you from germs and other things that can make you sick. Your health care provider will inject a solution known as PPD (purified protein  derivative) under the first layer of skin on your arm. This causes a blister-like bubble to form at the site. Your health care provider will then examine the site after a number of hours have passed to see if a reaction has occurred. HOW DO I PREPARE FOR THE TEST? There is no preparation required for this test. WHAT DO THE RESULTS MEAN? Your test results will be reported as either negative or positive.  If the tuberculin skin test produces a negative result, it is likely that you do not have TB and have not been exposed to the TB bacteria. If you or your health care provider suspects exposure, however, you may want to repeat the test a few weeks later. A blood test may also be used to check for TB. This is because you will not react to the tuberculin skin test until several weeks after exposure to TB bacteria. If you test positive to the tuberculin skin test, it is likely that you have been exposed to TB bacteria. The test does not distinguish between an active and a latent TB infection. A false-positive result can occur. A false-positive result for TB bacteria is incorrect because it indicates a condition or finding is present when it is not. Talk to your health care provider to discuss your results, treatment options, and if necessary, the need for more tests. It is your responsibility to obtain your test results. Ask the lab or department performing the test when and how you will get your results. Talk with your   health care provider if you have any questions about your results.   This information is not intended to replace advice given to you by your health care provider. Make sure you discuss any questions you have with your health care provider.   Document Released: 08/26/2005 Document Revised: 12/07/2014 Document Reviewed: 03/12/2014 Elsevier Interactive Patient Education 2016 [employer]  

## 2015-12-05 ENCOUNTER — Ambulatory Visit (INDEPENDENT_AMBULATORY_CARE_PROVIDER_SITE_OTHER): Payer: BLUE CROSS/BLUE SHIELD

## 2015-12-05 DIAGNOSIS — Z111 Encounter for screening for respiratory tuberculosis: Secondary | ICD-10-CM

## 2015-12-05 LAB — TB SKIN TEST
Induration: 0 mm
TB Skin Test: NEGATIVE

## 2015-12-05 NOTE — Progress Notes (Signed)
   Subjective:    Patient ID: Cheryl Adams, female    DOB: 1991-09-07, 25 y.o.   MRN: [id number]  HPI Patient is here for a TB test. Results negative. Induration 0.7300mm. Patient was given a copy of her results.     Review of Systems     Objective:   Physical Exam        Assessment & Plan:

## 2016-07-07 ENCOUNTER — Ambulatory Visit (INDEPENDENT_AMBULATORY_CARE_PROVIDER_SITE_OTHER): Payer: BLUE CROSS/BLUE SHIELD | Admitting: Internal Medicine

## 2016-07-07 VITALS — BP 116/58 | HR 71 | Temp 98.2°F | Ht 65.0 in | Wt 247.2 lb

## 2016-07-07 DIAGNOSIS — Z9089 Acquired absence of other organs: Secondary | ICD-10-CM

## 2016-07-07 DIAGNOSIS — R1310 Dysphagia, unspecified: Secondary | ICD-10-CM | POA: Diagnosis not present

## 2016-07-07 MED ORDER — LIDOCAINE VISCOUS 2 % MT SOLN: 20.0000 mL | OROMUCOSAL | 0 refills | Status: DC | PRN

## 2016-07-07 NOTE — Progress Notes (Signed)
   CC: odynophagia HPI: [name] is a 25 y.o. woman with PMH noted below here for 1 month duration of odynophagia  Please see Problem List/A&P for the status of the patient's chronic medical problems   Past Medical History:  Diagnosis Date  . Chronic tonsillitis 10/2014  . Cough 11/09/2014  . Difficulty swallowing pills     Review of Systems:  Constitutional: Negative for fever, chills, malaise/fatigue.  HEENT: Denies cough, sore throat, difficulty swallowing both liquids and solid food, denies allergies or postnasal drip. Has right tooth pain and is having it removed on Thursday and is on amoxicillin currently  Respiratory: Negative for cough, shortness of breath and wheezing.   Physical Exam: Vitals:   07/07/16 0850  BP: (!) 116/58  Pulse: 71  Temp: 98.2 F (36.8 C)  TempSrc: Oral  SpO2: 100%  Weight: 247 lb 3.2 oz (112.1 kg)  Height: 5\' 5"  (1.651 m)    General: A&O, in NAD HEENT: normal oropharynx. No erythema or exudates. Unable to visualize retained food or item. Status post tonsillectomy  Neck: supple, midline trachea, mildly tender to palpation on the right neck  CV: RRR, normal s1, s2, no m/r/g,  Resp: equal and symmetric breath sounds   Assessment & Plan:   See encounters tab for problem based medical decision making. Patient discussed with Dr. Criselda Adams

## 2016-07-07 NOTE — Assessment & Plan Note (Addendum)
Patient is here because 1 month ago she was eating a piece of meat and was not able to swallow for a while, but subsequently swallowed it and ever since then she feels like a scratchy throat and has some pain when she swallows and feels like there is an object there and wants an xray.  She denies swallowing any piece of metal, or any hard object like a fish bone. She currently is able to swallow normally with both liquids and solids. She denies fevers. Currently she is on amoxicillin for tooth removal on Thursday. She has a history of tonsillectomy I Dec 2015 and she says she never had any episodes of sore throat after the surgery.  On exam, normal oropharynx. Some ttp on right neck. She denies any postnasal drip.   A: odynophagia  -viscous lidocaine solution -referred to ENT for possible nasoendoscopy to visualize the area.  - ibuprofen PRN for pain

## 2016-07-07 NOTE — Patient Instructions (Signed)
Thank you for your visit today  Please follow up with ENT to evaluate your pain further-  They may see it through a camera down your throat  Please use the lidocaine solution as needed for pain

## 2016-07-10 NOTE — Progress Notes (Signed)
Internal Medicine Clinic Attending  Case discussed with Dr. Saraiya at the time of the visit.  We reviewed the resident's history and exam and pertinent patient test results.  I agree with the assessment, diagnosis, and plan of care documented in the resident's note.  

## 2016-07-17 DIAGNOSIS — R0989 Other specified symptoms and signs involving the circulatory and respiratory systems: Secondary | ICD-10-CM | POA: Insufficient documentation

## 2016-07-17 DIAGNOSIS — R09A2 Foreign body sensation, throat: Secondary | ICD-10-CM | POA: Insufficient documentation

## 2016-07-17 DIAGNOSIS — K219 Gastro-esophageal reflux disease without esophagitis: Secondary | ICD-10-CM | POA: Insufficient documentation

## 2016-12-25 ENCOUNTER — Encounter (HOSPITAL_COMMUNITY): Payer: Self-pay | Admitting: Emergency Medicine

## 2016-12-25 ENCOUNTER — Ambulatory Visit (HOSPITAL_COMMUNITY)
Admission: EM | Admit: 2016-12-25 | Discharge: 2016-12-25 | Disposition: A | Discharge status: Home / Self Care | Payer: BLUE CROSS/BLUE SHIELD | Attending: Family Medicine | Admitting: Family Medicine

## 2016-12-25 DIAGNOSIS — K529 Noninfective gastroenteritis and colitis, unspecified: Secondary | ICD-10-CM | POA: Diagnosis not present

## 2016-12-25 NOTE — ED Triage Notes (Signed)
Here for intermittent abd pain onset 2 weeks associated w/bloody stools, nauseas, HA, fevers, decreased appetite  Believes it may have been something she ate  LBM = yest  Denies urinary sx  A&O x4... NAD

## 2016-12-25 NOTE — Discharge Instructions (Signed)
Try clear liquid diet until seen by specialist for recheck, call sooner if problem gets worse

## 2016-12-25 NOTE — ED Provider Notes (Signed)
[facility]    CSN: [id number] Arrival date & time: 12/25/16  1317     History   Chief Complaint Chief Complaint  Patient presents with  . Abdominal Pain    HPI Cheryl Adams is a 26 y.o. female.   The history is provided by the patient.  Abdominal Pain  Pain location:  LUQ and RUQ Pain quality: sharp   Pain radiates to:  Does not radiate Pain severity:  Moderate Onset quality:  Gradual Duration:  2 weeks Progression:  Worsening Chronicity:  New Context: not sick contacts and not suspicious food intake   Relieved by:  None tried Worsened by:  Nothing Ineffective treatments:  None tried Associated symptoms: anorexia, diarrhea, hematochezia and nausea   Associated symptoms: no constipation, no dysuria, no fever and no vomiting     Past Medical History:  Diagnosis Date  . Chronic tonsillitis 10/2014  . Cough 11/09/2014  . Difficulty swallowing pills     Patient Active Problem List   Diagnosis Date Noted  . Odynophagia 07/07/2016  . Iron deficiency anemia 02/26/2015  . S/P tonsillectomy and adenoidectomy 02/26/2015  . Healthcare maintenance 02/26/2015    Past Surgical History:  Procedure Laterality Date  . TONSILLECTOMY AND ADENOIDECTOMY Bilateral 11/14/2014   Procedure: BILATERAL TONSILLECTOMY AND ADENOIDECTOMY;  Surgeon: Flo ShanksKarol Wolicki, MD;  Location: [facility];  Service: ENT;  Laterality: Bilateral;  . TYMPANOSTOMY TUBE PLACEMENT      OB History    No data available       Home Medications    Prior to Admission medications   Medication Sig Start Date End Date Taking? Authorizing Provider  ferrous sulfate 325 (65 FE) MG tablet Take 1 tablet (325 mg total) by mouth 3 (three) times daily with meals. 02/27/15 02/27/16  Otis BraceMarjan Rabbani, MD  lidocaine (XYLOCAINE) 2 % solution Use as directed 20 mLs in the mouth or throat as needed for mouth pain. 07/07/16   Deneise LeverParth Saraiya, MD    Family History History reviewed. No pertinent family  history.  Social History Social History  Substance Use Topics  . Smoking status: Never Smoker  . Smokeless tobacco: Never Used  . Alcohol use No     Allergies   Patient has no known allergies.   Review of Systems Review of Systems  Constitutional: Negative.  Negative for fever.  Respiratory: Negative.   Cardiovascular: Negative.   Gastrointestinal: Positive for abdominal pain, anorexia, diarrhea, hematochezia and nausea. Negative for constipation and vomiting.  Genitourinary: Negative.  Negative for dysuria.     Physical Exam Triage Vital Signs ED Triage Vitals  Enc Vitals Group     BP 12/25/16 1422 116/68     Pulse Rate 12/25/16 1422 63     Resp 12/25/16 1422 16     Temp 12/25/16 1422 98.4 F (36.9 C)     Temp Source 12/25/16 1422 Oral     SpO2 12/25/16 1422 100 %     Weight --      Height --      Head Circumference --      Peak Flow --      Pain Score 12/25/16 1420 5     Pain Loc --      Pain Edu? --      Excl. in GC? --    No data found.   Updated Vital Signs BP 116/68 (BP Location: Left Arm)   Pulse 63   Temp 98.4 F (36.9 C) (Oral)   Resp  16   LMP 11/28/2016   SpO2 100%   Visual Acuity Right Eye Distance:   Left Eye Distance:   Bilateral Distance:    Right Eye Near:   Left Eye Near:    Bilateral Near:     Physical Exam  Constitutional: She appears well-developed and well-nourished. No distress.  Abdominal: Soft. She exhibits no mass. Bowel sounds are decreased. There is tenderness in the right upper quadrant and left upper quadrant. There is no rigidity, no guarding and no CVA tenderness.    Nursing note and vitals reviewed.    UC Treatments / Results  Labs (all labs ordered are listed, but only abnormal results are displayed) Labs Reviewed - No data to display  EKG  EKG Interpretation None       Radiology No results found.  Procedures Procedures (including critical care time)  Medications Ordered in UC Medications -  No data to display   Initial Impression / Assessment and Plan / UC Course  I have reviewed the triage vital signs and the nursing notes.  Pertinent labs & imaging results that were available during my care of the patient were reviewed by me and considered in my medical decision making (see chart for details).       Final Clinical Impressions(s) / UC Diagnoses   Final diagnoses:  Colitis, acute    New Prescriptions Discharge Medication List as of 12/25/2016  3:38 PM       Linna Hoff, MD 12/31/16 1423

## 2017-05-05 ENCOUNTER — Encounter: Payer: Self-pay | Admitting: *Deleted

## 2018-06-01 ENCOUNTER — Encounter (HOSPITAL_COMMUNITY): Payer: Self-pay | Admitting: Emergency Medicine

## 2018-06-01 ENCOUNTER — Emergency Department (HOSPITAL_COMMUNITY)
Admission: EM | Admit: 2018-06-01 | Discharge: 2018-06-02 | Disposition: A | Discharge status: Home / Self Care | Payer: BLUE CROSS/BLUE SHIELD | Attending: Emergency Medicine | Admitting: Emergency Medicine

## 2018-06-01 DIAGNOSIS — F419 Anxiety disorder, unspecified: Secondary | ICD-10-CM | POA: Diagnosis not present

## 2018-06-01 DIAGNOSIS — D649 Anemia, unspecified: Secondary | ICD-10-CM

## 2018-06-01 DIAGNOSIS — D509 Iron deficiency anemia, unspecified: Secondary | ICD-10-CM

## 2018-06-01 LAB — BASIC METABOLIC PANEL
Anion gap: 7 (ref 5–15)
BUN: 7 mg/dL (ref 6–20)
CHLORIDE: 106 mmol/L (ref 98–111)
CO2: 24 mmol/L (ref 22–32)
Calcium: 9.2 mg/dL (ref 8.9–10.3)
Creatinine, Ser: 0.81 mg/dL (ref 0.44–1.00)
GFR calc non Af Amer: >60 mL/min (ref >60)
Glucose, Bld: 94 mg/dL (ref 70–99)
POTASSIUM: 3.2 mmol/L — AB (ref 3.5–5.1)
SODIUM: 137 mmol/L (ref 135–145)

## 2018-06-01 LAB — CBC
HEMATOCRIT: 33.5 % — AB (ref 36.0–46.0)
Hemoglobin: 9.5 g/dL — ABNORMAL LOW (ref 12.0–15.0)
MCH: 18.1 pg — AB (ref 26.0–34.0)
MCHC: 28.4 g/dL — ABNORMAL LOW (ref 30.0–36.0)
MCV: 63.7 fL — AB (ref 78.0–100.0)
Platelets: 331 10*3/uL (ref 150–400)
RBC: 5.26 MIL/uL — AB (ref 3.87–5.11)
RDW: 22.6 % — ABNORMAL HIGH (ref 11.5–15.5)
WBC: 8.1 10*3/uL (ref 4.0–10.5)

## 2018-06-01 NOTE — ED Triage Notes (Signed)
Pt arrives to ED stating that she thinks she is "anemic/" Pt reports intermittent palpitations and fatigue, reports some chest pain earlier. No chest pain at this time.

## 2018-06-01 NOTE — ED Notes (Signed)
Pt in room Alert, no family present

## 2018-06-02 LAB — I-STAT TROPONIN, ED: TROPONIN I, POC: 0 ng/mL (ref 0.00–0.08)

## 2018-06-02 MED ORDER — FERROUS SULFATE 325 (65 FE) MG PO TABS: 325.0000 mg | ORAL_TABLET | Freq: Three times a day (TID) | ORAL | 0 refills | Status: DC

## 2018-06-02 MED ORDER — HYDROXYZINE HCL 50 MG PO TABS: 50.0000 mg | ORAL_TABLET | Freq: Three times a day (TID) | ORAL | 0 refills | Status: AC
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
## 2018-06-02 NOTE — ED Notes (Signed)
PT states understanding of care given, follow up care, and medication prescribed. PT ambulated from ED to car with a steady gait. 

## 2018-06-02 NOTE — ED Notes (Signed)
Patient is A&Ox4.  No signs of distress noted.  Please see providers complete history and physical exam.  

## 2018-06-02 NOTE — Discharge Instructions (Signed)
Please start taking your iron supplements again. Be sure to follow-up with your PCP soon to discuss anemia and to get blood levels re-checked.  Vistaril can be taken if you start to feel like you are having an anxiety attack. You may also want to discuss long-term medications that work for anxiety like Zoloft or Lexapro.

## 2018-08-10 ENCOUNTER — Encounter: Payer: Self-pay | Admitting: Internal Medicine

## 2018-08-10 ENCOUNTER — Encounter: Payer: BLUE CROSS/BLUE SHIELD | Admitting: Internal Medicine

## 2018-09-15 ENCOUNTER — Ambulatory Visit (HOSPITAL_COMMUNITY)
Admission: EM | Admit: 2018-09-15 | Discharge: 2018-09-15 | Discharge status: Against Medical Advice | Payer: BLUE CROSS/BLUE SHIELD

## 2018-12-14 ENCOUNTER — Encounter: Payer: Self-pay | Admitting: Internal Medicine

## 2018-12-24 ENCOUNTER — Emergency Department (HOSPITAL_COMMUNITY): Payer: BLUE CROSS/BLUE SHIELD

## 2018-12-24 ENCOUNTER — Encounter (HOSPITAL_COMMUNITY): Payer: Self-pay | Admitting: *Deleted

## 2018-12-24 ENCOUNTER — Other Ambulatory Visit: Payer: Self-pay

## 2018-12-24 ENCOUNTER — Emergency Department (HOSPITAL_COMMUNITY)
Admission: EM | Admit: 2018-12-24 | Discharge: 2018-12-25 | Disposition: A | Discharge status: Home / Self Care | Payer: BLUE CROSS/BLUE SHIELD | Attending: Emergency Medicine | Admitting: Emergency Medicine

## 2018-12-24 DIAGNOSIS — R002 Palpitations: Secondary | ICD-10-CM | POA: Insufficient documentation

## 2018-12-24 DIAGNOSIS — Z79899 Other long term (current) drug therapy: Secondary | ICD-10-CM | POA: Insufficient documentation

## 2018-12-24 DIAGNOSIS — R0789 Other chest pain: Secondary | ICD-10-CM

## 2018-12-24 HISTORY — DX: Anemia, unspecified: D64.9

## 2018-12-24 LAB — CBC
HEMATOCRIT: 40.1 % (ref 36.0–46.0)
Hemoglobin: 11.8 g/dL — ABNORMAL LOW (ref 12.0–15.0)
MCH: 22.1 pg — AB (ref 26.0–34.0)
MCHC: 29.4 g/dL — ABNORMAL LOW (ref 30.0–36.0)
MCV: 75.2 fL — AB (ref 80.0–100.0)
NRBC: 0 % (ref 0.0–0.2)
Platelets: 272 10*3/uL (ref 150–400)
RBC: 5.33 MIL/uL — ABNORMAL HIGH (ref 3.87–5.11)
RDW: 19.7 % — AB (ref 11.5–15.5)
WBC: 7 10*3/uL (ref 4.0–10.5)

## 2018-12-24 LAB — BASIC METABOLIC PANEL
Anion gap: 5 (ref 5–15)
BUN: 9 mg/dL (ref 6–20)
CO2: 26 mmol/L (ref 22–32)
CREATININE: 0.82 mg/dL (ref 0.44–1.00)
Calcium: 9.4 mg/dL (ref 8.9–10.3)
Chloride: 106 mmol/L (ref 98–111)
GFR calc Af Amer: >60 mL/min (ref >60)
GFR calc non Af Amer: >60 mL/min (ref >60)
GLUCOSE: 110 mg/dL — AB (ref 70–99)
Potassium: 3.5 mmol/L (ref 3.5–5.1)
Sodium: 137 mmol/L (ref 135–145)

## 2018-12-24 LAB — I-STAT BETA HCG BLOOD, ED (MC, WL, AP ONLY): I-stat hCG, quantitative: <5 m[IU]/mL (ref <5)

## 2018-12-24 MED ORDER — SODIUM CHLORIDE 0.9% FLUSH: 3.0000 mL | Freq: Once | INTRAVENOUS | Status: DC

## 2018-12-24 NOTE — ED Triage Notes (Signed)
Pt arrives to triage reporting that tonight she felt her heart beating fast and she could not catch her breath. She has had some dizziness and nausea. Reports hx of anemia, not currently taking her iron pills.

## 2018-12-25 LAB — HEPATIC FUNCTION PANEL
ALK PHOS: 49 U/L (ref 38–126)
ALT: 22 U/L (ref 0–44)
AST: 25 U/L (ref 15–41)
Albumin: 3.8 g/dL (ref 3.5–5.0)
BILIRUBIN INDIRECT: 1 mg/dL — AB (ref 0.3–0.9)
BILIRUBIN TOTAL: 1.2 mg/dL (ref 0.3–1.2)
Bilirubin, Direct: 0.2 mg/dL (ref 0.0–0.2)
Total Protein: 7.3 g/dL (ref 6.5–8.1)

## 2018-12-25 LAB — URINALYSIS, ROUTINE W REFLEX MICROSCOPIC
Bilirubin Urine: NEGATIVE
Glucose, UA: NEGATIVE mg/dL
KETONES UR: NEGATIVE mg/dL
LEUKOCYTES UA: NEGATIVE
NITRITE: NEGATIVE
PH: 5 (ref 5.0–8.0)
PROTEIN: NEGATIVE mg/dL
Specific Gravity, Urine: 1.031 — ABNORMAL HIGH (ref 1.005–1.030)

## 2018-12-25 LAB — TROPONIN I: Troponin I: <0.03 ng/mL (ref <0.03)

## 2018-12-25 LAB — LIPASE, BLOOD: Lipase: 27 U/L (ref 11–51)

## 2018-12-25 LAB — D-DIMER, QUANTITATIVE: D-Dimer, Quant: 0.3 ug/mL-FEU (ref 0.00–0.50)

## 2018-12-25 LAB — PREGNANCY, URINE: Preg Test, Ur: NEGATIVE

## 2018-12-25 NOTE — ED Provider Notes (Signed)
[facility] EMERGENCY DEPARTMENT Provider Note   CSN: [id number] Arrival date & time: 12/24/18  2130     History   Chief Complaint Chief Complaint  Patient presents with  . Palpitations    HPI Cheryl Adams is a 28 y.o. female.  Patient with history of anemia presenting with episode of palpitations, racing heart, shortness of breath and chest pain, now resolved.  Patient states she was resting at home around 9 PM when she had sudden onset of palpitations and felt like she could not catch her breath.  This lasted for about 5 to 10 minutes.  She had some pain in the right side of her chest that was transient and lasted for several minutes then moved to her bilateral ribs and moved to her back.  Pain lasted for just a few minutes at a time and was not exertional or pleuritic.  Her shortness of breath and chest pain have since improved.  She denies any cough or fever.  She reports she has had dark urine and some urgency over the past several days.  She is concerned about her anemia as she has not been on her iron pills.  Denies any cardiac history.  Denies any dizziness or lightheadedness.  No focal weakness, numbness or tingling.  Denies any birth control use.  The history is provided by the patient.  Palpitations  Associated symptoms: chest pain and shortness of breath   Associated symptoms: no dizziness, no nausea, no vomiting and no weakness     Past Medical History:  Diagnosis Date  . Anemia   . Chronic tonsillitis 10/2014  . Cough 11/09/2014  . Difficulty swallowing pills     Patient Active Problem List   Diagnosis Date Noted  . Odynophagia 07/07/2016  . Iron deficiency anemia 02/26/2015  . S/P tonsillectomy and adenoidectomy 02/26/2015  . Healthcare maintenance 02/26/2015    Past Surgical History:  Procedure Laterality Date  . TONSILLECTOMY AND ADENOIDECTOMY Bilateral 11/14/2014   Procedure: BILATERAL TONSILLECTOMY AND ADENOIDECTOMY;  Surgeon: Flo Shanks, MD;  Location: [facility];  Service: ENT;  Laterality: Bilateral;  . TYMPANOSTOMY TUBE PLACEMENT       OB History   No obstetric history on file.      Home Medications    Prior to Admission medications   Medication Sig Start Date End Date Taking? Authorizing Provider  ferrous sulfate 325 (65 FE) MG tablet Take 1 tablet (325 mg total) by mouth 3 (three) times daily with meals. 06/02/18 07/02/18  Mortis, Jerrel Ivory I, PA-C  lidocaine (XYLOCAINE) 2 % solution Use as directed 20 mLs in the mouth or throat as needed for mouth pain. 07/07/16   Deneise Lever, MD    Family History No family history on file.  Social History Social History   Tobacco Use  . Smoking status: Never Smoker  . Smokeless tobacco: Never Used  Substance Use Topics  . Alcohol use: No  . Drug use: No     Allergies   Patient has no known allergies.   Review of Systems Review of Systems  Constitutional: Negative for fatigue.  HENT: Negative for congestion and rhinorrhea.   Respiratory: Positive for chest tightness and shortness of breath.   Cardiovascular: Positive for chest pain and palpitations.  Gastrointestinal: Negative for abdominal pain, nausea and vomiting.  Genitourinary: Positive for frequency and urgency.  Skin: Negative for rash.  Neurological: Positive for light-headedness. Negative for dizziness, weakness and headaches.    all other  systems are negative except as noted in the HPI and PMH.    Physical Exam Updated Vital Signs BP 135/67 (BP Location: Right Arm)   Pulse 91   Temp 98.4 F (36.9 C) (Oral)   Resp 18   Ht 5\' 5"  (1.651 m)   Wt 95.3 kg   SpO2 100%   BMI 34.95 kg/m   Physical Exam Vitals signs and nursing note reviewed.  Constitutional:      General: She is not in acute distress.    Appearance: She is well-developed.     Comments: Appears anxious  HENT:     Head: Normocephalic and atraumatic.     Mouth/Throat:     Pharynx: No oropharyngeal  exudate.  Eyes:     Conjunctiva/sclera: Conjunctivae normal.     Pupils: Pupils are equal, round, and reactive to light.  Neck:     Musculoskeletal: Normal range of motion and neck supple.     Comments: No meningismus. Cardiovascular:     Rate and Rhythm: Normal rate and regular rhythm.     Heart sounds: Normal heart sounds. No murmur.  Pulmonary:     Effort: Pulmonary effort is normal. No respiratory distress.     Breath sounds: Normal breath sounds.     Comments: Chest wall nontender Chest:     Chest wall: No tenderness.  Abdominal:     Palpations: Abdomen is soft.     Tenderness: There is no abdominal tenderness. There is no guarding or rebound.  Musculoskeletal: Normal range of motion.        General: No tenderness.  Skin:    General: Skin is warm.     Capillary Refill: Capillary refill takes less than 2 seconds.  Neurological:     Mental Status: She is alert and oriented to person, place, and time.     Cranial Nerves: No cranial nerve deficit.     Motor: No abnormal muscle tone.     Coordination: Coordination normal.     Comments:  5/5 strength throughout. CN 2-12 intact.Equal grip strength.   Psychiatric:        Behavior: Behavior normal.      ED Treatments / Results  Labs (all labs ordered are listed, but only abnormal results are displayed) Labs Reviewed  BASIC METABOLIC PANEL - Abnormal; Notable for the following components:      Result Value   Glucose, Bld 110 (*)    All other components within normal limits  CBC - Abnormal; Notable for the following components:   RBC 5.33 (*)    Hemoglobin 11.8 (*)    MCV 75.2 (*)    MCH 22.1 (*)    MCHC 29.4 (*)    RDW 19.7 (*)    All other components within normal limits  URINALYSIS, ROUTINE W REFLEX MICROSCOPIC - Abnormal; Notable for the following components:   APPearance CLOUDY (*)    Specific Gravity, Urine 1.031 (*)    Hgb urine dipstick MODERATE (*)    Bacteria, UA RARE (*)    All other components within  normal limits  HEPATIC FUNCTION PANEL - Abnormal; Notable for the following components:   Indirect Bilirubin 1.0 (*)    All other components within normal limits  D-DIMER, QUANTITATIVE (NOT AT [facility])  PREGNANCY, URINE  TROPONIN I  LIPASE, BLOOD  I-STAT BETA HCG BLOOD, ED ([facility], [facility], [facility] ONLY)    EKG EKG Interpretation  Date/Time:  Saturday December 24 2018 21:42:35 EST Ventricular Rate:  83 PR Interval:  132 QRS Duration: 84 QT Interval:  382 QTC Calculation: 448 R Axis:   73 Text Interpretation:  Normal sinus rhythm with sinus arrhythmia Normal ECG No significant change was found Confirmed by Glynn Octave [phone]) on 12/24/2018 11:36:30 PM   Radiology Dg Chest 2 View  Result Date: 12/24/2018 CLINICAL DATA:  Tachycardia and shortness of breath. EXAM: CHEST - 2 VIEW COMPARISON:  None. FINDINGS: Cardiomediastinal silhouette is normal. Mediastinal contours appear intact. There is no evidence of focal airspace consolidation, pleural effusion or pneumothorax. Osseous structures are without acute abnormality. Soft tissues are grossly normal. IMPRESSION: No active cardiopulmonary disease. Electronically Signed   By: Ted Mcalpine M.D.   On: 12/24/2018 22:36    Procedures Procedures (including critical care time)  Medications Ordered in ED Medications  sodium chloride flush (NS) 0.9 % injection 3 mL (has no administration in time range)     Initial Impression / Assessment and Plan / ED Course  I have reviewed the triage vital signs and the nursing notes.  Pertinent labs & imaging results that were available during my care of the patient were reviewed by me and considered in my medical decision making (see chart for details).    Episode of palpitations, lightheadedness, dyspnea and chest pain, now resolved.  Her description of chest pain is atypical for ACS.  She has migratory pain lasting for several minutes at a time along with palpitations and feeling like she could not catch  her breath.  She has no hypoxia or tachycardia.  Her EKG is unchanged. Will check d-dimer and chest x-ray.  Hemoglobin is improved from baseline. Continue iron supplementation. Chest x-ray is negative.  Patient improved on recheck.  D-dimer is negative.  LFTs and lipase are normal.  Her chest pain is atypical for ACS or PE.  Her anemia is improved compared to baseline.  Plan followup with PCP. Return precautions discussed. Final Clinical Impressions(s) / ED Diagnoses   Final diagnoses:  Palpitations  Atypical chest pain    ED Discharge Orders    None       Maximiliano Cromartie, Jeannett Senior, MD 12/25/18 [phone]

## 2018-12-25 NOTE — Discharge Instructions (Addendum)
There is no evidence of heart attack or blood clot in the lung.  Follow-up with your doctor.  Return to the ED if your chest pain becomes exertional, associated with shortness of breath, vomiting, sweating or other concerns.

## 2018-12-30 ENCOUNTER — Ambulatory Visit: Payer: Self-pay | Admitting: Internal Medicine

## 2018-12-30 ENCOUNTER — Other Ambulatory Visit: Payer: Self-pay

## 2018-12-30 VITALS — BP 121/51 | HR 62 | Temp 99.1°F | Ht 65.0 in | Wt 263.7 lb

## 2018-12-30 DIAGNOSIS — K2100 Gastro-esophageal reflux disease with esophagitis, without bleeding: Secondary | ICD-10-CM | POA: Insufficient documentation

## 2018-12-30 DIAGNOSIS — Z79899 Other long term (current) drug therapy: Secondary | ICD-10-CM

## 2018-12-30 DIAGNOSIS — K2210 Ulcer of esophagus without bleeding: Secondary | ICD-10-CM

## 2018-12-30 DIAGNOSIS — R1310 Dysphagia, unspecified: Secondary | ICD-10-CM

## 2018-12-30 LAB — POCT URINE PREGNANCY: Preg Test, Ur: NEGATIVE

## 2018-12-30 MED ORDER — OMEPRAZOLE 40 MG PO CPDR: 40.0000 mg | DELAYED_RELEASE_CAPSULE | Freq: Two times a day (BID) | ORAL | 0 refills | Status: DC

## 2018-12-30 NOTE — Patient Instructions (Addendum)
FOLLOW-UP INSTRUCTIONS When: 2 months What to bring: All of your medications  I have added omeprazole 40mg  to be taken two times per day for four weeks. After completion of this medication, please return for repeat evaluation.    I have ordered a test to look at how your swallow as I am concerned with you having acid reflux this bad at such a young age.  Thank you for your visit to the [facility] today. If you have any questions or concerns please call us at [phone].

## 2018-12-30 NOTE — Progress Notes (Signed)
   CC: "steak stuck in my throat"  HPI:[name] is a 28 y.o. female who presents for evaluation of steak "stuck in my throat". Please see individual problem based A/P for details.   PHQ-9: Based on the patients    Office Visit from 12/30/2018 in [facility]  PHQ-9 Total Score  5     score we have decided to monitor.  Past Medical History:  Diagnosis Date  . Anemia   . Chronic tonsillitis 10/2014  . Cough 11/09/2014  . Difficulty swallowing pills    Review of Systems:   ROS negative except as per HPI.  Physical Exam: Vitals:   12/30/18 1437  BP: (!) 121/51  Pulse: 62  Temp: 99.1 F (37.3 C)  TempSrc: Oral  SpO2: 100%  Weight: 263 lb 11.2 oz (119.6 kg)  Height: 5\' 5"  (1.651 m)   General: A/O x4, in no acute distress, afebrile, nondiaphoretic HEENT: I was unable to palpate or visualize obstruction in the patient's oropharynx  Assessment & Plan:   See Encounters Tab for problem based charting.  Patient discussed with Dr. Cyndie Chime

## 2018-12-30 NOTE — Assessment & Plan Note (Signed)
PPI for 4 weeks referral to GI

## 2018-12-30 NOTE — Assessment & Plan Note (Addendum)
  Odynophagia: The patient presents today for evaluation of a 2-1/2-year sensation of having a piece of steak stuck in her throat.  She feels that she incompletely chewed a piece of steak at that time which became lodged in her throat and has been there since.  She states that she has been to see ENT who performed a laryngoscopy but were unable to visualize and abnormality aside from ulceration of the proximal esophagus as per her report.  She endorses persistence of the sensation and feels it is on the right.  She denies fever, chills, nausea, vomiting, chest pain, abdominal pain.  She states that there is occasionally mild faint pain with swallowing and possibly mild dysphagia. I am concerned due to the described degree of ulceration the patient of this age for possible esophageal stricture-ring-diverticulum this was likely GERD and feel evaluation with a barium swallow prior to GI is appropriate.  Plan: Omeprazole 40 mg twice daily for 4 weeks Barium swallow ordered Referral to GI for further evaluation  The patient will need a urine pregnancy test prior to the Imaging, as she was menstruating at the time of this test the results are not 100% reliable, but given that she was menstruating I would say she is unlikely to be pregnant. She may need a repeat POCT pregnancy test if radiology refuses to accept this.

## 2019-01-01 NOTE — Progress Notes (Signed)
Medicine attending: Medical history, presenting problems, physical findings, and medications, reviewed with resident physician Dr Lawrence Harbrecht on the day of the patient visit and I concur with his evaluation and management plan. 

## 2019-01-05 NOTE — Addendum Note (Signed)
Addended by: Neomia Dear on: 01/05/2019 03:48 PM   Modules accepted: Orders

## 2019-01-28 ENCOUNTER — Other Ambulatory Visit: Payer: Self-pay

## 2019-01-28 ENCOUNTER — Encounter (HOSPITAL_COMMUNITY): Payer: Self-pay | Admitting: Emergency Medicine

## 2019-01-28 ENCOUNTER — Emergency Department (HOSPITAL_COMMUNITY)
Admission: EM | Admit: 2019-01-28 | Discharge: 2019-01-28 | Disposition: A | Discharge status: Home / Self Care | Payer: Self-pay | Attending: Emergency Medicine | Admitting: Emergency Medicine

## 2019-01-28 DIAGNOSIS — Z79899 Other long term (current) drug therapy: Secondary | ICD-10-CM | POA: Insufficient documentation

## 2019-01-28 DIAGNOSIS — R1310 Dysphagia, unspecified: Secondary | ICD-10-CM | POA: Insufficient documentation

## 2019-01-28 NOTE — ED Triage Notes (Signed)
Pt c/o difficulty swallowing x 1 year. States she has an appointment with a GI doctor next week. States she feels anxious sometimes while trying to swallow, and has to "gasp for air". NAD noted. Pt speaking in complete sentences, LS clear.

## 2019-01-28 NOTE — ED Notes (Signed)
The pt  Had food stuck in her throat  A year ago  Since then she has had difficulty swallowing and intermittent trouble breathing  She has an appointmenrt with a ent doctor and for xrays

## 2019-01-28 NOTE — ED Provider Notes (Signed)
[facility] EMERGENCY DEPARTMENT Provider Note   CSN: [id number] Arrival date & time: 01/28/19  0008    History   Chief Complaint Chief Complaint  Patient presents with  . Dysphagia    HPI Cheryl Adams is a 28 y.o. female.     28 year old female with a history of anemia and anxiety presents to the emergency department for evaluation of odynophagia.  She reports that she has had sensation of food stuck in her throat for 2.5 years.  She was previously evaluated by ENT with normal laryngoscopy.  She was seen by her primary care doctor for this 1 month ago.  Started on omeprazole.  She has been taking this medication sporadically.  Reports no significant relief using this medication, though she has not been fully compliant.  She is scheduled to see gastroenterology in 1 week.  Orders also placed for outpatient barium swallow study.  She feels as though she needs to take small bites and chew her food extensively.  She expresses concern over trouble breathing.  She notes that the sensation improves when she controls and slows her breathing.  No fevers, syncope, drooling, vomiting.  The history is provided by the patient. No language interpreter was used.    Past Medical History:  Diagnosis Date  . Anemia   . Chronic tonsillitis 10/2014  . Cough 11/09/2014  . Difficulty swallowing pills     Patient Active Problem List   Diagnosis Date Noted  . Gastroesophageal reflux disease with esophagitis 12/30/2018  . Odynophagia 07/07/2016  . Iron deficiency anemia 02/26/2015  . S/P tonsillectomy and adenoidectomy 02/26/2015  . Healthcare maintenance 02/26/2015    Past Surgical History:  Procedure Laterality Date  . TONSILLECTOMY AND ADENOIDECTOMY Bilateral 11/14/2014   Procedure: BILATERAL TONSILLECTOMY AND ADENOIDECTOMY;  Surgeon: Flo Shanks, MD;  Location: [facility];  Service: ENT;  Laterality: Bilateral;  . TYMPANOSTOMY TUBE PLACEMENT       OB  History   No obstetric history on file.      Home Medications    Prior to Admission medications   Medication Sig Start Date End Date Taking? Authorizing Provider  ferrous sulfate 325 (65 FE) MG tablet Take 1 tablet (325 mg total) by mouth 3 (three) times daily with meals. 06/02/18 12/25/18  Mortis, Jerrel Ivory I, PA-C  hydrochlorothiazide (HYDRODIURIL) 25 MG tablet Take 25 mg by mouth daily.    [provider]  lidocaine (XYLOCAINE) 2 % solution Use as directed 20 mLs in the mouth or throat as needed for mouth pain. Patient not taking: Reported on 12/25/2018 07/07/16   Deneise Lever, MD  omeprazole (PRILOSEC) 40 MG capsule Take 1 capsule (40 mg total) by mouth 2 (two) times daily. 12/30/18   Lanelle Bal, MD    Family History No family history on file.  Social History Social History   Tobacco Use  . Smoking status: Never Smoker  . Smokeless tobacco: Never Used  Substance Use Topics  . Alcohol use: No  . Drug use: No     Allergies   Patient has no known allergies.   Review of Systems Review of Systems Ten systems reviewed and are negative for acute change, except as noted in the HPI.    Physical Exam Updated Vital Signs BP 115/75   Pulse (!) 58   Temp 98.2 F (36.8 C) (Oral)   Resp 15   Ht 5\' 5"  (1.651 m)   Wt 117.9 kg   SpO2 100%   BMI  43.27 kg/m   Physical Exam Vitals signs and nursing note reviewed.  Constitutional:      General: She is not in acute distress.    Appearance: She is well-developed. She is not diaphoretic.     Comments: Nontoxic appearing and in NAD  HENT:     Head: Normocephalic and atraumatic.     Mouth/Throat:     Comments: Clear oropharynx. Tolerating secretions. Normal phonation. No angioedema. Eyes:     General: No scleral icterus.    Conjunctiva/sclera: Conjunctivae normal.  Neck:     Musculoskeletal: Normal range of motion.  Cardiovascular:     Rate and Rhythm: Normal rate and regular rhythm.     Pulses: Normal  pulses.  Pulmonary:     Effort: Pulmonary effort is normal. No respiratory distress.     Breath sounds: No stridor. No wheezing.     Comments: Respirations even and unlabored. No stridor. Musculoskeletal: Normal range of motion.  Skin:    General: Skin is warm and dry.     Coloration: Skin is not pale.     Findings: No erythema or rash.  Neurological:     Mental Status: She is alert and oriented to person, place, and time.     Coordination: Coordination normal.  Psychiatric:        Mood and Affect: Mood is anxious.        Behavior: Behavior normal.      ED Treatments / Results  Labs (all labs ordered are listed, but only abnormal results are displayed) Labs Reviewed - No data to display  EKG None  Radiology No results found.  Procedures Procedures (including critical care time)  Medications Ordered in ED Medications - No data to display   Initial Impression / Assessment and Plan / ED Course  I have reviewed the triage vital signs and the nursing notes.  Pertinent labs & imaging results that were available during my care of the patient were reviewed by me and considered in my medical decision making (see chart for details).        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  She is scheduled to see gastroenterology in 1 week.  Also ordered to have an outpatient barium swallow study.  She is tolerating her secretions without difficulty.  Normal phonation.  She has no stridor on exam.  No hypoxia noted.  Given chronicity of symptoms, I do not feel further emergent work-up is indicated.  I have provided reassurance to the patient.  Offered Maalox with lidocaine which she declines.  Question globus sensation given history of anxiety and improvement in the sensation when she controls and slows her breathing.  Return precautions discussed and provided. Patient discharged in stable condition with no unaddressed concerns.   Final Clinical Impressions(s) / ED  Diagnoses   Final diagnoses:  Odynophagia    ED Discharge Orders    None       Antony Madura, PA-C 01/28/19 0403    Palumbo, April, MD 01/28/19 [phone]

## 2019-02-03 DIAGNOSIS — K219 Gastro-esophageal reflux disease without esophagitis: Secondary | ICD-10-CM | POA: Insufficient documentation

## 2019-02-03 DIAGNOSIS — R1314 Dysphagia, pharyngoesophageal phase: Secondary | ICD-10-CM | POA: Insufficient documentation

## 2019-10-18 ENCOUNTER — Encounter: Payer: Self-pay | Admitting: Internal Medicine

## 2019-12-05 ENCOUNTER — Encounter: Payer: Self-pay | Admitting: Internal Medicine

## 2019-12-05 ENCOUNTER — Other Ambulatory Visit: Payer: Self-pay

## 2019-12-05 ENCOUNTER — Ambulatory Visit: Payer: Self-pay | Admitting: Internal Medicine

## 2019-12-05 VITALS — BP 131/68 | HR 75 | Temp 100.4°F | Wt 273.1 lb

## 2019-12-05 DIAGNOSIS — Z124 Encounter for screening for malignant neoplasm of cervix: Secondary | ICD-10-CM

## 2019-12-05 DIAGNOSIS — R1084 Generalized abdominal pain: Secondary | ICD-10-CM

## 2019-12-05 DIAGNOSIS — R109 Unspecified abdominal pain: Secondary | ICD-10-CM | POA: Insufficient documentation

## 2019-12-05 DIAGNOSIS — R7309 Other abnormal glucose: Secondary | ICD-10-CM

## 2019-12-05 DIAGNOSIS — R509 Fever, unspecified: Secondary | ICD-10-CM

## 2019-12-05 LAB — POCT URINALYSIS DIPSTICK
Bilirubin, UA: NEGATIVE
Glucose, UA: NEGATIVE
Leukocytes, UA: NEGATIVE
Nitrite, UA: NEGATIVE
Protein, UA: POSITIVE — AB
Spec Grav, UA: 1.025 (ref 1.010–1.025)
Urobilinogen, UA: 0.2 E.U./dL
pH, UA: 7.5 (ref 5.0–8.0)

## 2019-12-05 LAB — POCT GLYCOSYLATED HEMOGLOBIN (HGB A1C): Hemoglobin A1C: 5.4 % (ref 4.0–5.6)

## 2019-12-05 LAB — GLUCOSE, CAPILLARY: Glucose-Capillary: 79 mg/dL (ref 70–99)

## 2019-12-05 NOTE — Assessment & Plan Note (Signed)
Nonspecific upper and lower abdominal symptoms which are somewhat chronic but have been worse the past four months. Last tested for STI in 2016. Unable to do pap today as she is on her menstrual cycle. She does have fever to 100.4 with repeat of 99.8 but no other symptoms of specific acute illness. Large differential with diffuse symptoms but includes IBS, STI, UTI, nephrolithiasis, cholelithiasis, and PCOS. Possibly epigastric etiology as well although her symptoms of reflux have resolved.   - follow-up one week for pap smear  - UA today to r/o infection - consider repeat at f/u to assess for hematuria  - hemoglobin a1c  - CBC with diff, CMP  - requested to check temperature at home and call if temperature increases or she develops further symptoms.  - alternate heating pad and midol for cramping. Discussed NSAIDs could potentially make upper abdominal symptoms worse.

## 2019-12-05 NOTE — Patient Instructions (Addendum)
Thank you for allowing [place] to provide your care today. Today we discussed your abdominal pain and cramping.     I have ordered the following labs for you: Complete blood count, complete metabolic panel, urinalysis.     I will call if any are abnormal.    For your cramping pain, you can use an abdominal heating pad as needed or Tylenol or Midol.   Please keep a food journal for the next week to notice if any particular foods are causing you abdominal pain.    Please check your temperature at home. If you develop a temperature above 100.4 please call the clinic for an earlier appointment.   Please follow-up in one week for pap smear.    Please call the internal medicine center clinic if you have any questions or concerns, we may be able to help and keep you from a long and expensive emergency room wait. Our clinic and after hours phone number is [phone], the best time to call is Monday through Friday 9 am to 4 pm but there is always someone available 24/7 if you have an emergency. If you need medication refills please notify your pharmacy one week in advance and they will send [place] a request.

## 2019-12-05 NOTE — Progress Notes (Deleted)
   CC: abdominal cramps  HPI:  [name] is a 29 y.o. with PMH as below.   Please see A&P for assessment of the patient's acute and chronic medical conditions.   Abdominal sharp pain off and on for months, worse the past four months. Lower abd cramping worse the past four months as well and not with period. Periods have also been lighter.  She has had nausea. Not associated with foods. Regular BM. Urinates a lot. Feel pressure in her lower abdomen. She drinks a lot of water. No dysuria. Not thirstier than usual.   Not sexually active currently. Couple months ago. She states they did use protection. No discharge or changes in odor. Last pap smear was in high school. Does not use birth control.   She takes iron supplement since age 89 and has anemia.   She was supposed to see GI for GERD but never was able to make an appointment. She is no longer have acid reflux though and is not taking omeprazole. No odynophagia either.   Past Medical History:  Diagnosis Date  . Anemia   . Chronic tonsillitis 10/2014  . Cough 11/09/2014  . Difficulty swallowing pills    Review of Systems:  *** 10 point ROS negative except as noted in HPI  Physical Exam:   Constitution: NAD, appears stated age HENT: Eyes:  Cardio: RRR, no m/r/g, no LE edema  Respiratory: CTA, no w/r/r Abdominal: NTTP, soft, non-distended MSK: moving all extremities Neuro: normal affect, a&ox3 GU: Skin: c/d/i    Vitals:   12/05/19 1433  BP: 131/68  Pulse: 75  Temp: (!) 100.4 F (38 C)  TempSrc: Oral  SpO2: 99%  Weight: 273 lb 1.6 oz (123.9 kg)   ***  Assessment & Plan:   See Encounters Tab for problem based charting.  Patient {GC/GE:3044014::"discussed with","seen with"} Dr. {NAMES:[id number]::"Butcher","Granfortuna","E. Hoffman","Klima","Mullen","Narendra","Raines","Vincent"}

## 2019-12-05 NOTE — Progress Notes (Signed)
   CC: abdominal cramps  HPI:  [name] is a 29 y.o. with PMH as below.   Please see A&P for assessment of the patient's acute and chronic medical conditions.   Abdominal sharp pain off and on for months, worse the past four months. Lower abdominal cramping as well, previously just with her periods but now this is happening randomly as well. Periods have also been lighter, lasting around four days instead of the usual 7. She also endorses occasional nausea but none currently and no vomiting. Symptoms are not associated with food. Her BM are regular. She has had increased urination but no dysuria or other urinary symptoms. She does feel pressure in her upper and lower abdomen randomly. She drinks a lot of water but has not noted increased thirst. She denies fever, chills, respiratory symptoms or recent sick contacts.  She is not currently sexually active and last had sex two months ago. They did use condoms. She has no discharge or change in odor. She is not currently on other birth control and last pap smear she thinks was in high school. She thinks she may have a history of HPV but is unsure and unable to view records. She is currently on her period.  She was supposed to see GI for GERD but never was able to make an appointment. She is no longer have acid reflux though and is not taking omeprazole. Denies odynophagia or dysphagia as well.    Past Medical History:  Diagnosis Date  . Anemia   . Chronic tonsillitis 10/2014  . Cough 11/09/2014  . Difficulty swallowing pills    Review of Systems:  10 point ROS negative except as noted in HPI  Physical Exam: Constitution: NAD, appears stated age, obese Cardio: RRR, no m/r/g, no LE edema  Respiratory: CTA, no w/r/r Abdominal: TTP upper abdomen bilaterally and epigastric region, normal BS, soft, non-distended Neuro: normal affect, a&ox3 GU: no discharge or odor, minimal blood Skin: c/d/i    Vitals:   12/05/19 1433  BP: 131/68    Pulse: 75  Temp: (!) 100.4 F (38 C)  TempSrc: Oral  SpO2: 99%  Weight: 273 lb 1.6 oz (123.9 kg)    Assessment & Plan:   See Encounters Tab for problem based charting.  Patient discussed with Dr. Antony Contras

## 2019-12-06 LAB — CMP14 + ANION GAP
ALT: 13 IU/L (ref 0–32)
AST: 16 IU/L (ref 0–40)
Albumin/Globulin Ratio: 1.3 (ref 1.2–2.2)
Albumin: 3.9 g/dL (ref 3.9–5.0)
Alkaline Phosphatase: 64 IU/L (ref 39–117)
Anion Gap: 13 mmol/L (ref 10.0–18.0)
BUN/Creatinine Ratio: 9 (ref 9–23)
BUN: 7 mg/dL (ref 6–20)
Bilirubin Total: 0.5 mg/dL (ref 0.0–1.2)
CO2: 22 mmol/L (ref 20–29)
Calcium: 9.1 mg/dL (ref 8.7–10.2)
Chloride: 105 mmol/L (ref 96–106)
Creatinine, Ser: 0.74 mg/dL (ref 0.57–1.00)
GFR calc Af Amer: 128 mL/min/{1.73_m2} (ref >59)
GFR calc non Af Amer: 111 mL/min/{1.73_m2} (ref >59)
Globulin, Total: 3 g/dL (ref 1.5–4.5)
Glucose: 91 mg/dL (ref 65–99)
Potassium: 3.9 mmol/L (ref 3.5–5.2)
Sodium: 140 mmol/L (ref 134–144)
Total Protein: 6.9 g/dL (ref 6.0–8.5)

## 2019-12-06 LAB — CBC WITH DIFFERENTIAL/PLATELET
Basophils Absolute: 0 10*3/uL (ref 0.0–0.2)
Basos: 1 %
EOS (ABSOLUTE): 0.3 10*3/uL (ref 0.0–0.4)
Eos: 5 %
Hematocrit: 41.6 % (ref 34.0–46.6)
Hemoglobin: 13.5 g/dL (ref 11.1–15.9)
Immature Grans (Abs): 0 10*3/uL (ref 0.0–0.1)
Immature Granulocytes: 1 %
Lymphocytes Absolute: 1.9 10*3/uL (ref 0.7–3.1)
Lymphs: 34 %
MCH: 25.6 pg — ABNORMAL LOW (ref 26.6–33.0)
MCHC: 32.5 g/dL (ref 31.5–35.7)
MCV: 79 fL (ref 79–97)
Monocytes Absolute: 0.4 10*3/uL (ref 0.1–0.9)
Monocytes: 8 %
Neutrophils Absolute: 3 10*3/uL (ref 1.4–7.0)
Neutrophils: 51 %
Platelets: 257 10*3/uL (ref 150–450)
RBC: 5.28 x10E6/uL (ref 3.77–5.28)
RDW: 13 % (ref 11.7–15.4)
WBC: 5.6 10*3/uL (ref 3.4–10.8)

## 2019-12-07 NOTE — Progress Notes (Signed)
Internal Medicine Clinic Attending  Case discussed with Dr. Seawell at the time of the visit.  We reviewed the resident's history and exam and pertinent patient test results.  I agree with the assessment, diagnosis, and plan of care documented in the resident's note.    

## 2019-12-20 ENCOUNTER — Encounter: Payer: Self-pay | Admitting: Internal Medicine

## 2019-12-20 ENCOUNTER — Ambulatory Visit: Payer: Medicaid Other

## 2019-12-20 ENCOUNTER — Telehealth: Payer: Self-pay | Admitting: *Deleted

## 2019-12-20 ENCOUNTER — Other Ambulatory Visit: Payer: Self-pay | Admitting: Internal Medicine

## 2019-12-20 DIAGNOSIS — R809 Proteinuria, unspecified: Secondary | ICD-10-CM

## 2019-12-20 NOTE — Telephone Encounter (Addendum)
Attempted to reach out to patient regarding message below, no answer-CMA did not leave message on recorder.  Pt scheduled to see financial counselor today-will obtain specimen at that time.Kingsley Spittle Cassady1/20/202112:32 PM     Message -----  From: Versie Starks, DO  Sent: 12/20/2019  9:38 AM EST  To: Imp Location manager, Imp Refill Nurse Pool  Subject: need urinalysis                  I have placed a future order for a urinalysis for her, she just needs to be notified to come in and do this when she is able. She had a small amount of protein in her urine that we need to quantify.   Thanks,   [employer]

## 2019-12-20 NOTE — Addendum Note (Signed)
Addended by: Guinevere Scarlet A on: 12/20/2019 01:15 PM   Modules accepted: Orders

## 2020-01-03 ENCOUNTER — Other Ambulatory Visit: Payer: Medicaid Other

## 2020-01-11 ENCOUNTER — Encounter: Payer: Self-pay | Admitting: Radiation Oncology

## 2020-01-31 ENCOUNTER — Telehealth: Payer: Self-pay | Admitting: Internal Medicine

## 2020-01-31 NOTE — Telephone Encounter (Signed)
TC to patient, she states she just got a job and needs an employment form filled out which includes her vaccine hx. Per chart, she has no immunization hx other than PPD Per chart, pt has not seen PCP since 11/2018.  RN informed pt she would need to come in for an appt w/ PCP and could receive eligible vaccines at that appt.  She was in agreement, appt with PCP scheduled for 02/07/20 @ 3:15 SChaplin, RN,BSN

## 2020-01-31 NOTE — Addendum Note (Signed)
Addended by: Guinevere Scarlet A on: 01/31/2020 11:31 AM   Modules accepted: Orders

## 2020-01-31 NOTE — Telephone Encounter (Signed)
Please call patient back.  Pt needing a letter to be written about her  Immunizations and any current vaccinations to be performed.

## 2020-02-01 NOTE — Telephone Encounter (Signed)
Thank you. I agree 

## 2020-02-07 ENCOUNTER — Encounter: Payer: Self-pay | Admitting: Internal Medicine

## 2020-02-07 ENCOUNTER — Ambulatory Visit: Payer: Self-pay | Admitting: Internal Medicine

## 2020-02-07 VITALS — BP 126/63 | HR 64 | Temp 99.2°F | Ht 65.0 in | Wt 275.7 lb

## 2020-02-07 DIAGNOSIS — Z0000 Encounter for general adult medical examination without abnormal findings: Secondary | ICD-10-CM

## 2020-02-07 DIAGNOSIS — D509 Iron deficiency anemia, unspecified: Secondary | ICD-10-CM

## 2020-02-07 DIAGNOSIS — D500 Iron deficiency anemia secondary to blood loss (chronic): Secondary | ICD-10-CM

## 2020-02-07 DIAGNOSIS — R1310 Dysphagia, unspecified: Secondary | ICD-10-CM

## 2020-02-07 NOTE — Assessment & Plan Note (Signed)
  Iron deficiency anemia: The patient presents today for evaluation of long standing iron deficiency anemia felt to be due to menstruation as no other clear source identified. Her most recent Hgb as WNL's in January of this year at 13.5. She is requesting her blood type and Hgb today. She denied weakness, nausea, blood loss, dark stool, or bloody stool. She takes oral iron daily.  Plan: Continue PO iron supplementation OTC CBC and ABO/Rh type ordered

## 2020-02-07 NOTE — Patient Instructions (Signed)
FOLLOW-UP INSTRUCTIONS When: yearly For: Routine What to bring: All of your medications  I have not made any changes to your medications today.  Please call the health department of the county you lived in as a child or the pediatrician you saw as a child to obtain your vaccination records. You may also try the health department in your current county if you received vaccines here. Your last pediatrician may have this information.  If you are unable to obtain these, please let us know and we can obtain blood titers to prove immunization as well.  Today we discussed anemia history and need for vaccination records. I will notify you of the results of any labs from today's evaluation when available to me.   Thank you for your visit to the [facility] today. If you have any questions or concerns please call us at [phone].

## 2020-02-07 NOTE — Assessment & Plan Note (Signed)
Continue to be of concern. Etiology remains uncertain. I advised her to follow-up with GI

## 2020-02-07 NOTE — Assessment & Plan Note (Signed)
Patient in need of PAP. Will need to discuss at next visit.

## 2020-02-07 NOTE — Progress Notes (Signed)
   CC: anemia  HPI:Cheryl Adams is a 29 y.o. female who presents for evaluation of anemia. Please see individual problem based A/P for details.  Past Medical History:  Diagnosis Date  . Anemia   . Chronic tonsillitis 10/2014  . Cough 11/09/2014  . Difficulty swallowing pills    Review of Systems:  ROS negative except as per HPI.  Physical Exam: Vitals:   02/07/20 1516  BP: 126/63  Pulse: 64  Temp: 99.2 F (37.3 C)  TempSrc: Oral  SpO2: 100%  Weight: 275 lb 11.2 oz (125.1 kg)  Height: 5\' 5"  (1.651 m)   Filed Weights   02/07/20 1516  Weight: 275 lb 11.2 oz (125.1 kg)   General: A/O x4, in no acute distress, afebrile, nondiaphoretic HEENT: PEERL, EMO intact Cardio: RRR, no mrg's  Pulmonary: CTA bilaterally, no wheezing or crackles  MSK: BLE nontender, nonedematous Neuro: Alert, CNII-XII grossly intact, conversational, normal gait Psych: Appropriate affect, not depressed in appearance, engages well  Assessment & Plan:   See Encounters Tab for problem based charting.  Patient discussed with Dr. 04/08/20

## 2020-02-08 ENCOUNTER — Encounter: Payer: Self-pay | Admitting: Internal Medicine

## 2020-02-08 LAB — CBC
Hematocrit: 42.7 % (ref 34.0–46.6)
Hemoglobin: 13.6 g/dL (ref 11.1–15.9)
MCH: 25.9 pg — ABNORMAL LOW (ref 26.6–33.0)
MCHC: 31.9 g/dL (ref 31.5–35.7)
MCV: 81 fL (ref 79–97)
Platelets: 273 10*3/uL (ref 150–450)
RBC: 5.26 x10E6/uL (ref 3.77–5.28)
RDW: 13.4 % (ref 11.7–15.4)
WBC: 5.4 10*3/uL (ref 3.4–10.8)

## 2020-02-08 LAB — ABO AND RH: Rh Factor: POSITIVE

## 2020-02-13 NOTE — Progress Notes (Signed)
Internal Medicine Clinic Attending  Case discussed with Dr. Harbrecht at the time of the visit.  We reviewed the resident's history and exam and pertinent patient test results.  I agree with the assessment, diagnosis, and plan of care documented in the resident's note.   

## 2020-04-01 ENCOUNTER — Encounter: Payer: Self-pay | Admitting: *Deleted

## 2020-08-15 IMAGING — DX DG CHEST 2V
2 series · 2 of 2 positions shown · non-contrast
Comparison: None.

CLINICAL DATA: Tachycardia and shortness of breath.

EXAM:
CHEST - 2 VIEW

[chest pa]
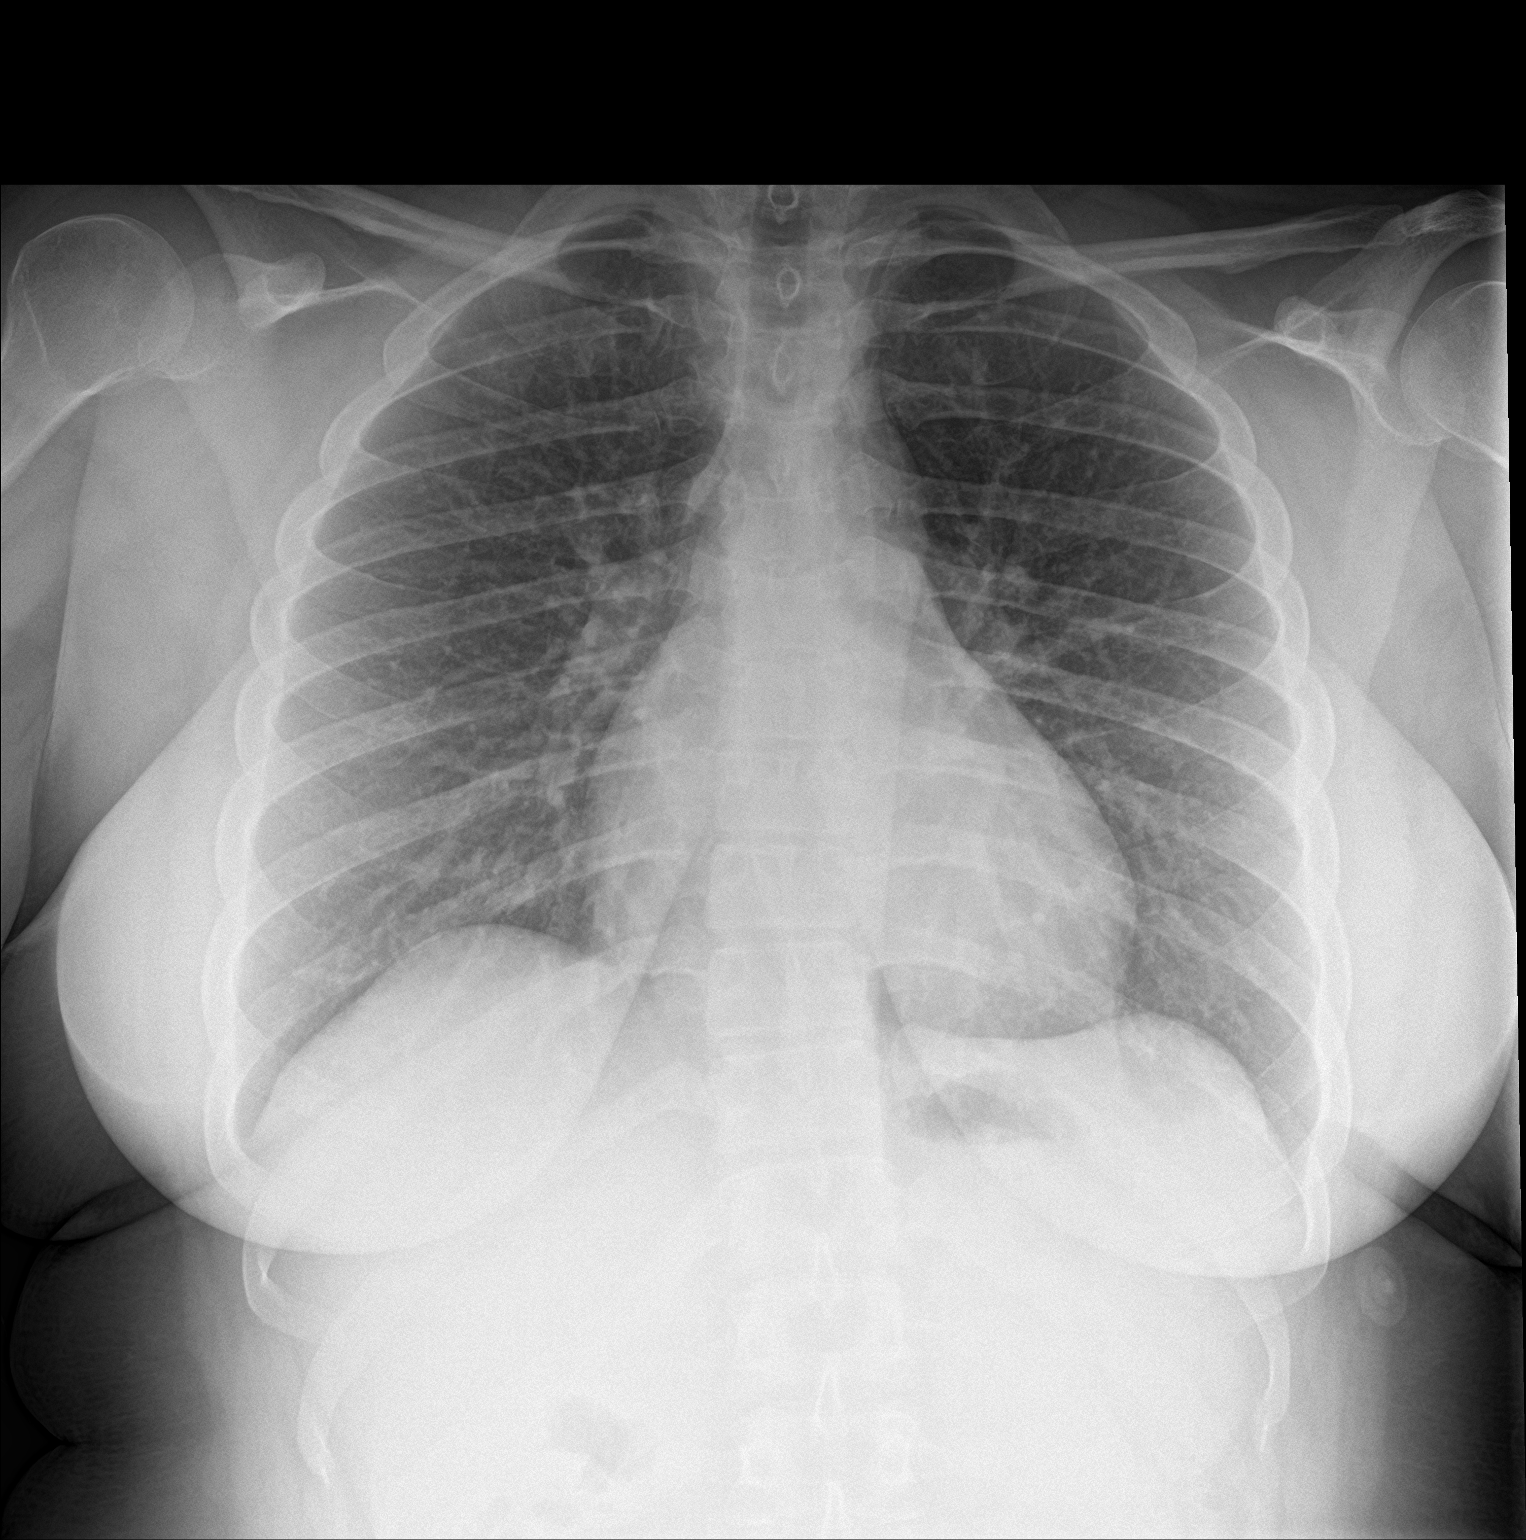

[chest lat]
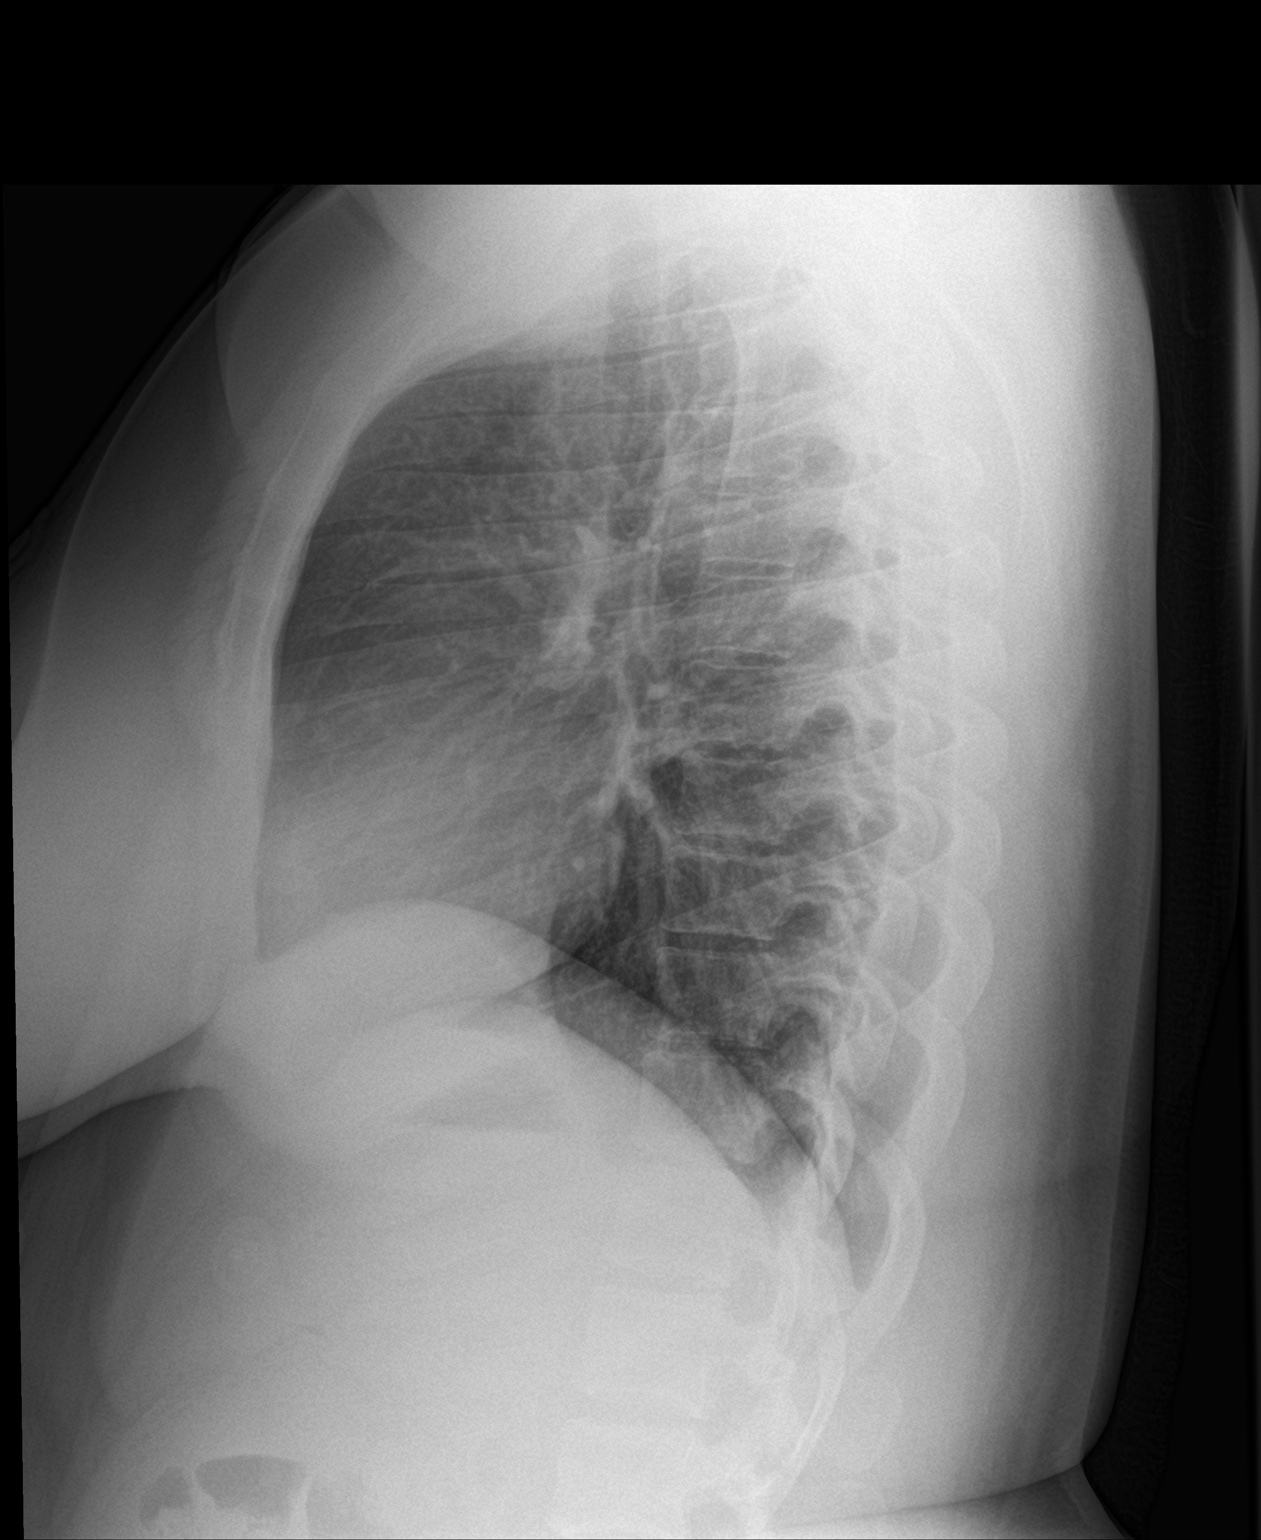

[2 of 2 positions shown; findings below may reference images not displayed]

FINDINGS: Cardiomediastinal silhouette is normal. Mediastinal contours appear
intact.

There is no evidence of focal airspace consolidation, pleural
effusion or pneumothorax.

Osseous structures are without acute abnormality. Soft tissues are
grossly normal.
IMPRESSION: No active cardiopulmonary disease.

## 2021-04-02 ENCOUNTER — Other Ambulatory Visit: Payer: Self-pay

## 2021-04-02 ENCOUNTER — Ambulatory Visit (INDEPENDENT_AMBULATORY_CARE_PROVIDER_SITE_OTHER): Payer: No Payment, Other | Admitting: Clinical

## 2021-04-02 DIAGNOSIS — F331 Major depressive disorder, recurrent, moderate: Secondary | ICD-10-CM

## 2021-04-14 ENCOUNTER — Encounter (HOSPITAL_COMMUNITY): Payer: Self-pay | Admitting: Emergency Medicine

## 2021-04-14 ENCOUNTER — Other Ambulatory Visit: Payer: Self-pay

## 2021-04-14 ENCOUNTER — Ambulatory Visit (HOSPITAL_COMMUNITY)
Admission: EM | Admit: 2021-04-14 | Discharge: 2021-04-14 | Disposition: A | Discharge status: Home / Self Care | Payer: Medicaid Other | Attending: Physician Assistant | Admitting: Physician Assistant

## 2021-04-14 DIAGNOSIS — R519 Headache, unspecified: Secondary | ICD-10-CM | POA: Insufficient documentation

## 2021-04-14 DIAGNOSIS — R509 Fever, unspecified: Secondary | ICD-10-CM | POA: Insufficient documentation

## 2021-04-14 DIAGNOSIS — Z20822 Contact with and (suspected) exposure to covid-19: Secondary | ICD-10-CM | POA: Insufficient documentation

## 2021-04-14 DIAGNOSIS — J3489 Other specified disorders of nose and nasal sinuses: Secondary | ICD-10-CM

## 2021-04-14 DIAGNOSIS — R0981 Nasal congestion: Secondary | ICD-10-CM | POA: Insufficient documentation

## 2021-04-14 DIAGNOSIS — H5789 Other specified disorders of eye and adnexa: Secondary | ICD-10-CM | POA: Insufficient documentation

## 2021-04-14 DIAGNOSIS — R059 Cough, unspecified: Secondary | ICD-10-CM | POA: Insufficient documentation

## 2021-04-14 LAB — POC INFLUENZA A AND B ANTIGEN (URGENT CARE ONLY)
INFLUENZA A ANTIGEN, POC: NEGATIVE
INFLUENZA B ANTIGEN, POC: NEGATIVE

## 2021-04-14 LAB — SARS CORONAVIRUS 2 (TAT 6-24 HRS): SARS Coronavirus 2: NEGATIVE

## 2021-04-14 MED ORDER — AMOXICILLIN-POT CLAVULANATE 875-125 MG PO TABS: 1.0000 | ORAL_TABLET | Freq: Two times a day (BID) | ORAL | 0 refills | Status: DC

## 2021-04-14 NOTE — Discharge Instructions (Addendum)
Your vision is normal in office today.  I suspect symptoms are related to sinuses and so we have started you on an antibiotic.  You should take this twice a day.  I would also recommend using Flonase and Zyrtec for additional symptom relief.  If you have any worsening symptoms please return for further evaluation.

## 2021-04-14 NOTE — ED Triage Notes (Signed)
Patient went w/o her glasses for about 5 years, recently got a new prescription for glasses, now having severe headaches, bilateral eye pressure since having the glasses x 4 days.  Patient has taken Tylenol 500mg  w/o relief.

## 2021-04-14 NOTE — ED Provider Notes (Signed)
[facility]    CSN: [id number] Arrival date & time: 04/14/21  0957      History   Chief Complaint Chief Complaint  Patient presents with  . Bilateral Eye Pressure / Left eye worse    HPI Cheryl Adams is a 30 y.o. female.   Patient presents today accompanied by her mother.  Her mother reports she is in with severe depression and is working with a Veterinary surgeon and psychiatrist for the past 6 months.  As result she does not communicate regularly and often does not answer questions.  Mother provided the majority of history.  She reports associated pain behind bilateral eyes, sinus pressure, nasal congestion, cough, fever.  She is concerned that symptoms are related to new glasses as pain began soon after getting a new prescription after not wearing glasses for more than 5 years.  She denies any focal weakness, dysarthria, nausea, vomiting.  She denies any medication changes or recent head injury.  Denies any recent antibiotic use.  Denies any known sick contacts.  She has not received influenza or COVID-19 vaccinations.  She does have allergies but does not take medication on a regular basis for this condition.  Denies history of smoking, COPD, asthma.  She denies history of primary headache disorder.     Past Medical History:  Diagnosis Date  . Anemia   . Chronic tonsillitis 10/2014  . Cough 11/09/2014  . Difficulty swallowing pills     Patient Active Problem List   Diagnosis Date Noted  . Odynophagia 07/07/2016  . Iron deficiency anemia 02/26/2015  . S/P tonsillectomy and adenoidectomy 02/26/2015  . Healthcare maintenance 02/26/2015    Past Surgical History:  Procedure Laterality Date  . TONSILLECTOMY AND ADENOIDECTOMY Bilateral 11/14/2014   Procedure: BILATERAL TONSILLECTOMY AND ADENOIDECTOMY;  Surgeon: Flo Shanks, MD;  Location: [facility];  Service: ENT;  Laterality: Bilateral;  . TYMPANOSTOMY TUBE PLACEMENT      OB History   No obstetric  history on file.      Home Medications    Prior to Admission medications   Medication Sig Start Date End Date Taking? Authorizing Provider  amoxicillin-clavulanate (AUGMENTIN) 875-125 MG tablet Take 1 tablet by mouth every 12 (twelve) hours. 04/14/21  Yes Mico Spark K, PA-C  ferrous sulfate 325 (65 FE) MG tablet Take 1 tablet (325 mg total) by mouth 3 (three) times daily with meals. 06/02/18 12/25/18  Mortis, Sharyon Medicus, PA-C    Family History History reviewed. No pertinent family history.  Social History Social History   Tobacco Use  . Smoking status: Never Smoker  . Smokeless tobacco: Never Used  Substance Use Topics  . Alcohol use: No  . Drug use: No     Allergies   Patient has no known allergies.   Review of Systems Review of Systems  Constitutional: Positive for activity change. Negative for appetite change, fatigue and fever.  HENT: Positive for congestion and sinus pressure. Negative for sneezing and sore throat.   Eyes: Positive for photophobia and visual disturbance.  Respiratory: Positive for cough. Negative for shortness of breath.   Cardiovascular: Negative for chest pain.  Gastrointestinal: Negative for abdominal pain, diarrhea, nausea and vomiting.  Musculoskeletal: Negative for arthralgias and myalgias.  Neurological: Positive for headaches. Negative for dizziness and light-headedness.     Physical Exam Triage Vital Signs ED Triage Vitals [04/14/21 1134]  Enc Vitals Group     BP 114/69     Pulse Rate 60  Resp      Temp 99.1 F (37.3 C)     Temp Source Oral     SpO2 100 %     Weight      Height      Head Circumference      Peak Flow      Pain Score 7     Pain Loc      Pain Edu?      Excl. in GC?    No data found.  Updated Vital Signs BP 114/69 (BP Location: Right Arm)   Pulse 60   Temp 99.1 F (37.3 C) (Oral)   LMP 04/07/2021   SpO2 100%   Visual Acuity Right Eye Distance: 20/20 Left Eye Distance: 20/20 Bilateral  Distance: 20/25  Right Eye Near:   Left Eye Near:    Bilateral Near:     Physical Exam Vitals reviewed.  Constitutional:      General: She is awake. She is not in acute distress.    Appearance: Normal appearance. She is not ill-appearing.     Comments: Very pleasant female appears stated age no acute distress  HENT:     Head: Normocephalic and atraumatic.     Right Ear: Tympanic membrane, ear canal and external ear normal. Tympanic membrane is not erythematous or bulging.     Left Ear: Tympanic membrane, ear canal and external ear normal. Tympanic membrane is not erythematous or bulging.     Nose:     Right Sinus: Maxillary sinus tenderness and frontal sinus tenderness present.     Left Sinus: Maxillary sinus tenderness and frontal sinus tenderness present.     Mouth/Throat:     Tongue: Tongue does not deviate from midline.     Pharynx: Uvula midline. No oropharyngeal exudate or posterior oropharyngeal erythema.  Eyes:     Extraocular Movements: Extraocular movements intact.     Conjunctiva/sclera: Conjunctivae normal.     Pupils: Pupils are equal, round, and reactive to light.  Cardiovascular:     Rate and Rhythm: Normal rate and regular rhythm.     Heart sounds: No murmur heard.   Pulmonary:     Effort: Pulmonary effort is normal.     Breath sounds: Normal breath sounds. No wheezing, rhonchi or rales.     Comments: Clear to auscultation bilaterally Abdominal:     Palpations: Abdomen is soft.     Tenderness: There is no abdominal tenderness.  Musculoskeletal:     Cervical back: No spinous process tenderness or muscular tenderness.  Lymphadenopathy:     Head:     Right side of head: No submental, submandibular or tonsillar adenopathy.     Left side of head: No submental, submandibular or tonsillar adenopathy.  Neurological:     General: No focal deficit present.     Cranial Nerves: Cranial nerves are intact.     Motor: Motor function is intact.     Coordination:  Coordination is intact.     Gait: Gait is intact.     Comments: Strength 5/5 bilateral upper and lower extremities.  No focal neurological defect on exam.  Cranial nerves II through XII intact.  Psychiatric:        Speech: She is noncommunicative.        Behavior: Behavior is slowed and withdrawn. Behavior is cooperative.        Thought Content: Thought content does not include suicidal ideation.     Comments: Patient avoids eye contact.  Will answer yes and no  questions but otherwise does not engage in interview.  Normal appearance/hygiene.      UC Treatments / Results  Labs (all labs ordered are listed, but only abnormal results are displayed) Labs Reviewed  SARS CORONAVIRUS 2 (TAT 6-24 HRS)  POC INFLUENZA A AND B ANTIGEN (URGENT CARE ONLY)    EKG   Radiology No results found.  Procedures Procedures (including critical care time)  Medications Ordered in UC Medications - No data to display  Initial Impression / Assessment and Plan / UC Course  I have reviewed the triage vital signs and the nursing notes.  Pertinent labs & imaging results that were available during my care of the patient were reviewed by me and considered in my medical decision making (see chart for details).     Flu testing was negative in office today.  COVID testing is pending.  Given several weeks of symptoms we will start patient on Augmentin to cover for sinus infection.  Vision and eye exam normal today.  Discussed that headache could be exacerbated by using glasses and she should use them for several hours and then take a break.  Recommended she follow-up with optometrist.  She is to use over-the-counter medications including Flonase and Zyrtec to manage symptoms.  Strict return precautions given to which patient expressed understanding.  Final Clinical Impressions(s) / UC Diagnoses   Final diagnoses:  Sinus pressure  Sinus headache  Cough  Nasal congestion  Fever, unspecified     Discharge  Instructions     Your vision is normal in office today.  I suspect symptoms are related to sinuses and so we have started you on an antibiotic.  You should take this twice a day.  I would also recommend using Flonase and Zyrtec for additional symptom relief.  If you have any worsening symptoms please return for further evaluation.    ED Prescriptions    Medication Sig Dispense Auth. Provider   amoxicillin-clavulanate (AUGMENTIN) 875-125 MG tablet Take 1 tablet by mouth every 12 (twelve) hours. 14 tablet Dejana Pugsley, Noberto Retort, PA-C     PDMP not reviewed this encounter.   Jeani Hawking, PA-C 04/14/21 1238

## 2021-04-20 NOTE — Progress Notes (Signed)
Comprehensive Clinical Assessment (CCA) Note  04/02/2021 Cheryl Adams [id number]  Chief Complaint:  Chief Complaint  Patient presents with  . Depression  . Anxiety   Visit Diagnosis:   Major depressive disorder, recurrent episode, moderate w/ anxious distress  Interpretive Summary:   Client is a 30 year old female presenting to [facility] for outpatient behavioral health services. Client is referred by [facility] for an assessment. Client presents with her mother for the appointment. Mother reported over the past year the client has displayed symptoms of depression and anxiety. Mother reported about a year ago at the client's previous job she was bullied by coworkers after finding out about her "emotional relationship" she had with her boss. Mother reported that was the mark of the client's noticeable withdrawn behavior, not socializing with the family, decreased appetite, and not talking. Mother reported maybe there were signs when the client was younger of some mental health but nothing that stood out. Mother reported the client has a history of IEP due to having issues with focusing on things. Client reported she has not been feeling like herself and wants to be able to communicate better. Client reported "I'm use to talking but it's hard for me to function sometimes. Client reported no other history of mental health services. Client denied substance use history. Client presented oriented times five, appropriately dressed and friendly. Client denied hallucinations, delusions, suicidal and homicidal ideations. Client was screened for pain, nutrition, [place] suicide severity and the following SDOH: GAD 7 : Generalized Anxiety Score 04/02/2021  Nervous, Anxious, on Edge 3  Control/stop worrying 3  Worry too much - different things 3  Trouble relaxing 3  Restless 3  Easily annoyed or irritable 1  Afraid - awful might happen 1  Total GAD 7 Score 17  Anxiety Difficulty Very difficult   Flowsheet  Row Counselor from 04/02/2021 in [facility]  PHQ-9 Total Score 20      Treatment recommendations: individual therapy, psychiatric evaluation and medication management  Therapist provided information on format of appointment (virtual or face to face).  The client was advised to call back or seek an in-person evaluation if the symptoms worsen or if the condition fails to improve as anticipated before the next scheduled appointment. Client was in agreement with treatment recommendations.   CCA Biopsychosocial Intake/Chief Complaint:  The clients mother reported the client was referred by [facility] for outpatient behavioral health services. Mother reported since the end of last year she has noticed the client has gone mute and isolated herself from the family. Mother reported the client it has been an extreme change of behavior from the outgoing personality she is known to have.  Current Symptoms/Problems: Client reported depressed mood, difficulty focusing, stopped talking, insomnia, difficulty concentrating   Patient Reported Schizophrenia/Schizoaffective Diagnosis in Past: No  Type of Services Patient Feels are Needed: psychiatric evaluation, medication management, and individual therapy   Initial Clinical Notes/Concerns: No data recorded  Mental Health Symptoms Depression:  Change in energy/activity; Weight gain/loss; Sleep (too much or little)   Duration of Depressive symptoms: Greater than two weeks   Mania:  None   Anxiety:   Tension   Psychosis:  None   Duration of Psychotic symptoms: No data recorded  Trauma:  None   Obsessions:  None   Compulsions:  None   Inattention:  None   Hyperactivity/Impulsivity:  N/A   Oppositional/Defiant Behaviors:  None   Emotional Irregularity:  None   Other Mood/Personality Symptoms:  No data recorded  Mental Status Exam Appearance and self-care  Stature:  Average   Weight:  Average weight    Clothing:  Casual   Grooming:  Normal   Cosmetic use:  Age appropriate   Posture/gait:  Normal   Motor activity:  Not Remarkable   Sensorium  Attention:  Normal   Concentration:  Normal   Orientation:  X5   Recall/memory:  Normal   Affect and Mood  Affect:  Appropriate   Mood:  Euthymic   Relating  Eye contact:  Normal   Facial expression:  Responsive   Attitude toward examiner:  Cooperative   Thought and Language  Speech flow: Slow; Soft   Thought content:  Appropriate to Mood and Circumstances   Preoccupation:  None   Hallucinations:  None   Organization:  No data recorded  [employer] of Knowledge:  Fair   Intelligence:  Average   Abstraction:  Normal   Judgement:  Good   Reality Testing:  Adequate   Insight:  Poor   Decision Making:  Normal   Social Functioning  Social Maturity:  Isolates   Social Judgement:  Normal   Stress  Stressors:  Work   Coping Ability:  Set designer Deficits:  Communication   Supports:  Family     Religion: Religion/Spirituality Are You A Religious Person?: No  Leisure/Recreation: Leisure / Recreation Do You Have Hobbies?: Yes  Exercise/Diet: Exercise/Diet Do You Exercise?: No Have You Gained or Lost A Significant Amount of Weight in the Past Six Months?: No Do You Follow a Special Diet?: No Do You Have Any Trouble Sleeping?: Yes   CCA Employment/Education Employment/Work Situation: Employment / Work Situation Employment situation: Unemployed Patient's job has been impacted by current illness: Yes Describe how patient's job has been impacted: Client's mother reported at the client's previous job she had an emotional relationship with her boss. Mother reported when the clients' coworkers found out she was taunted and bullied. Mother reported that job was a year ago and that was the start of the clients withdrawn behaviors.  Education: Education Did Garment/textile technologist From [employer]?: Yes Did Theme park manager?: Yes What Type of College Degree Do you Have?: [employer] Did You Have An Individualized Education Program (IIEP): Yes   CCA Family/Childhood History Family and Relationship History: Family history Marital status: Single Does patient have children?: No  Childhood History:  Childhood History By whom was/is the patient raised?: Mother Additional childhood history information: Client reported she was raised by her mother. Client reported she childhood was "normal". Client reported her dad was somewhat in her life. Client reported no relationship with him now. Good relationship with her mother. Does patient have siblings?: Yes Did patient suffer any verbal/emotional/physical/sexual abuse as a child?: No Did patient suffer from severe childhood neglect?: No Has patient ever been sexually abused/assaulted/raped as an adolescent or adult?: No Was the patient ever a victim of a crime or a disaster?: No Witnessed domestic violence?: No Has patient been affected by domestic violence as an adult?: No  Child/Adolescent Assessment:     CCA Substance Use Alcohol/Drug Use: Alcohol / Drug Use History of alcohol / drug use?: No history of alcohol / drug abuse                         ASAM's:  Six Dimensions of Multidimensional Assessment  Dimension 1:  Acute Intoxication and/or Withdrawal Potential:      Dimension 2:  Biomedical Conditions and Complications:      Dimension 3:  Emotional, Behavioral, or Cognitive Conditions and Complications:     Dimension 4:  Readiness to Change:     Dimension 5:  Relapse, Continued use, or Continued Problem Potential:     Dimension 6:  Recovery/Living Environment:     ASAM Severity Score:    ASAM Recommended Level of Treatment:     Substance use Disorder (SUD)    Recommendations for Services/Supports/Treatments: Recommendations for Services/Supports/Treatments Recommendations For  Services/Supports/Treatments: Medication Management,Individual Therapy  DSM5 Diagnoses: Patient Active Problem List   Diagnosis Date Noted  . Odynophagia 07/07/2016  . Iron deficiency anemia 02/26/2015  . S/P tonsillectomy and adenoidectomy 02/26/2015  . Healthcare maintenance 02/26/2015    Patient Centered Plan: Patient is on the following Treatment Plan(s):  Depression   Referrals to Alternative Service(s): Referred to Alternative Service(s):   Place:   Date:   Time:    Referred to Alternative Service(s):   Place:   Date:   Time:    Referred to Alternative Service(s):   Place:   Date:   Time:    Referred to Alternative Service(s):   Place:   Date:   Time:     Loree Fee, LCSW

## 2021-04-24 ENCOUNTER — Other Ambulatory Visit: Payer: Self-pay

## 2021-04-24 ENCOUNTER — Emergency Department (HOSPITAL_COMMUNITY): Payer: Self-pay

## 2021-04-24 ENCOUNTER — Emergency Department (HOSPITAL_COMMUNITY)
Admission: EM | Admit: 2021-04-24 | Discharge: 2021-04-27 | Payer: Self-pay | Attending: Emergency Medicine | Admitting: Emergency Medicine

## 2021-04-24 ENCOUNTER — Encounter (HOSPITAL_COMMUNITY): Payer: Self-pay | Admitting: Emergency Medicine

## 2021-04-24 DIAGNOSIS — F061 Catatonic disorder due to known physiological condition: Secondary | ICD-10-CM

## 2021-04-24 DIAGNOSIS — R4182 Altered mental status, unspecified: Secondary | ICD-10-CM

## 2021-04-24 DIAGNOSIS — F332 Major depressive disorder, recurrent severe without psychotic features: Secondary | ICD-10-CM | POA: Insufficient documentation

## 2021-04-24 DIAGNOSIS — R251 Tremor, unspecified: Secondary | ICD-10-CM | POA: Insufficient documentation

## 2021-04-24 DIAGNOSIS — Z20822 Contact with and (suspected) exposure to covid-19: Secondary | ICD-10-CM | POA: Insufficient documentation

## 2021-04-24 DIAGNOSIS — R000 Tachycardia, unspecified: Secondary | ICD-10-CM | POA: Insufficient documentation

## 2021-04-24 LAB — COMPREHENSIVE METABOLIC PANEL
ALT: 10 U/L (ref 0–44)
AST: 14 U/L — ABNORMAL LOW (ref 15–41)
Albumin: 3.8 g/dL (ref 3.5–5.0)
Alkaline Phosphatase: 45 U/L (ref 38–126)
Anion gap: 11 (ref 5–15)
BUN: <5 mg/dL — ABNORMAL LOW (ref 6–20)
CO2: 21 mmol/L — ABNORMAL LOW (ref 22–32)
Calcium: 9.1 mg/dL (ref 8.9–10.3)
Chloride: 102 mmol/L (ref 98–111)
Creatinine, Ser: 0.85 mg/dL (ref 0.44–1.00)
GFR, Estimated: >60 mL/min (ref >60)
Glucose, Bld: 177 mg/dL — ABNORMAL HIGH (ref 70–99)
Potassium: 3.1 mmol/L — ABNORMAL LOW (ref 3.5–5.1)
Sodium: 134 mmol/L — ABNORMAL LOW (ref 135–145)
Total Bilirubin: 1.5 mg/dL — ABNORMAL HIGH (ref 0.3–1.2)
Total Protein: 7 g/dL (ref 6.5–8.1)

## 2021-04-24 LAB — CBC WITH DIFFERENTIAL/PLATELET
Abs Immature Granulocytes: 0.05 10*3/uL (ref 0.00–0.07)
Basophils Absolute: 0 10*3/uL (ref 0.0–0.1)
Basophils Relative: 0 %
Eosinophils Absolute: 0 10*3/uL (ref 0.0–0.5)
Eosinophils Relative: 0 %
HCT: 43.7 % (ref 36.0–46.0)
Hemoglobin: 14 g/dL (ref 12.0–15.0)
Immature Granulocytes: 1 %
Lymphocytes Relative: 22 %
Lymphs Abs: 1.9 10*3/uL (ref 0.7–4.0)
MCH: 25.7 pg — ABNORMAL LOW (ref 26.0–34.0)
MCHC: 32 g/dL (ref 30.0–36.0)
MCV: 80.3 fL (ref 80.0–100.0)
Monocytes Absolute: 0.6 10*3/uL (ref 0.1–1.0)
Monocytes Relative: 7 %
Neutro Abs: 5.9 10*3/uL (ref 1.7–7.7)
Neutrophils Relative %: 70 %
Platelets: 272 10*3/uL (ref 150–400)
RBC: 5.44 MIL/uL — ABNORMAL HIGH (ref 3.87–5.11)
RDW: 15.9 % — ABNORMAL HIGH (ref 11.5–15.5)
WBC: 8.4 10*3/uL (ref 4.0–10.5)
nRBC: 0.2 % (ref 0.0–0.2)

## 2021-04-24 LAB — RAPID URINE DRUG SCREEN, HOSP PERFORMED
Amphetamines: NOT DETECTED
Barbiturates: NOT DETECTED
Benzodiazepines: NOT DETECTED
Cocaine: NOT DETECTED
Opiates: NOT DETECTED
Tetrahydrocannabinol: NOT DETECTED

## 2021-04-24 LAB — URINALYSIS, ROUTINE W REFLEX MICROSCOPIC
Bilirubin Urine: NEGATIVE
Glucose, UA: 50 mg/dL — AB
Hgb urine dipstick: NEGATIVE
Ketones, ur: 20 mg/dL — AB
Leukocytes,Ua: NEGATIVE
Nitrite: NEGATIVE
Protein, ur: 100 mg/dL — AB
Specific Gravity, Urine: 1.029 (ref 1.005–1.030)
pH: 5 (ref 5.0–8.0)

## 2021-04-24 LAB — SALICYLATE LEVEL: Salicylate Lvl: <7 mg/dL — ABNORMAL LOW (ref 7.0–30.0)

## 2021-04-24 LAB — ACETAMINOPHEN LEVEL: Acetaminophen (Tylenol), Serum: <10 ug/mL — ABNORMAL LOW (ref 10–30)

## 2021-04-24 LAB — RESP PANEL BY RT-PCR (FLU A&B, COVID) ARPGX2
Influenza A by PCR: NEGATIVE
Influenza B by PCR: NEGATIVE
SARS Coronavirus 2 by RT PCR: NEGATIVE

## 2021-04-24 LAB — ETHANOL: Alcohol, Ethyl (B): <10 mg/dL (ref <10)

## 2021-04-24 LAB — I-STAT BETA HCG BLOOD, ED (MC, WL, AP ONLY): I-stat hCG, quantitative: <5 m[IU]/mL (ref <5)

## 2021-04-24 MED ORDER — POTASSIUM CHLORIDE CRYS ER 20 MEQ PO TBCR
40.0000 meq | EXTENDED_RELEASE_TABLET | Freq: Once | ORAL | Status: AC
  Administered 2021-04-24: 40 meq via ORAL
  Filled 2021-04-24: qty 2

## 2021-04-24 MED ORDER — ZIPRASIDONE MESYLATE 20 MG IM SOLR
10.0000 mg | Freq: Once | INTRAMUSCULAR | Status: AC
  Administered 2021-04-24: 10 mg via INTRAMUSCULAR
  Filled 2021-04-24: qty 20

## 2021-04-24 MED ORDER — STERILE WATER FOR INJECTION IJ SOLN
INTRAMUSCULAR | Status: AC
  Administered 2021-04-24: 10 mL
  Filled 2021-04-24: qty 10

## 2021-04-24 MED ORDER — SODIUM CHLORIDE 0.9 % IV BOLUS
500.0000 mL | Freq: Once | INTRAVENOUS | Status: AC
  Administered 2021-04-24: 500 mL via INTRAVENOUS

## 2021-04-24 MED ORDER — LORAZEPAM 2 MG/ML IJ SOLN
1.0000 mg | Freq: Once | INTRAMUSCULAR | Status: AC
  Administered 2021-04-24: 1 mg via INTRAVENOUS
  Filled 2021-04-24: qty 1

## 2021-04-24 MED ORDER — STERILE WATER FOR INJECTION IJ SOLN
INTRAMUSCULAR | Status: AC
  Administered 2021-04-24: 1.2 mL
  Filled 2021-04-24: qty 10

## 2021-04-24 NOTE — ED Provider Notes (Signed)
[facility] EMERGENCY DEPARTMENT Provider Note   CSN: [id number] Arrival date & time: 04/24/21  0602     History Chief Complaint  Patient presents with  . Tremors  . Altered Mental Status    Cheryl Adams is a 30 y.o. female.  HPI Patient is a 30 year old female with a medical history as noted below.  Her mother is at bedside and provides patient's history.  She states that patient had "a traumatic incident" about 1 year ago.  She states that over this time she has progressed and has become nonverbal.  She reports associated anhedonia, decreased appetite, as well as difficulty leaving the home.  She states that the patient now has difficulty with her ADLs and requires assistance with showering as well as feeding herself.  Mother states that last night she began having screaming episodes.  During my initial evaluation patient was screaming and rocking back and forth in the emergency department.  She has since stopped and is now sleeping comfortably in bed.  She follows commands and nods her head "yes" and "no" when I ask her questions but otherwise is nonverbal.  Patient was evaluated by behavioral health on Apr 02, 2021.  Patient was apparently bullied by her coworkers due to having an emotional relationship with her boss at work.  After this, patient began exhibiting her current behavior.    Past Medical History:  Diagnosis Date  . Anemia   . Chronic tonsillitis 10/2014  . Cough 11/09/2014  . Difficulty swallowing pills     Patient Active Problem List   Diagnosis Date Noted  . Odynophagia 07/07/2016  . Iron deficiency anemia 02/26/2015  . S/P tonsillectomy and adenoidectomy 02/26/2015  . Healthcare maintenance 02/26/2015    Past Surgical History:  Procedure Laterality Date  . TONSILLECTOMY AND ADENOIDECTOMY Bilateral 11/14/2014   Procedure: BILATERAL TONSILLECTOMY AND ADENOIDECTOMY;  Surgeon: Flo Shanks, MD;  Location: [facility];  Service:  ENT;  Laterality: Bilateral;  . TYMPANOSTOMY TUBE PLACEMENT       OB History   No obstetric history on file.     No family history on file.  Social History   Tobacco Use  . Smoking status: Never Smoker  . Smokeless tobacco: Never Used  Substance Use Topics  . Alcohol use: No  . Drug use: No    Home Medications Prior to Admission medications   Medication Sig Start Date End Date Taking? Authorizing Provider  acetaminophen (TYLENOL) 500 MG tablet Take 500 mg by mouth every 6 (six) hours as needed for moderate pain or headache.   Yes [provider]  ferrous sulfate 325 (65 FE) MG tablet Take 325 mg by mouth daily. 06/02/18  Yes [provider]  amoxicillin-clavulanate (AUGMENTIN) 875-125 MG tablet Take 1 tablet by mouth every 12 (twelve) hours. Patient not taking: Reported on 04/24/2021 04/14/21   Raspet, Noberto Retort, PA-C  ferrous sulfate 325 (65 FE) MG tablet Take 1 tablet (325 mg total) by mouth 3 (three) times daily with meals. Patient not taking: Reported on 04/24/2021 06/02/18 12/25/18  Dagoberto Ligas I, PA-C    Allergies    Patient has no known allergies.  Review of Systems   Review of Systems  Unable to perform ROS: Patient nonverbal   Physical Exam Updated Vital Signs BP (!) 96/56   Pulse 77   Temp 99.7 F (37.6 C) (Oral)   Resp 15   Ht 5\' 4"  (1.626 m)   Wt 88.5 kg  LMP 04/07/2021   SpO2 99%   BMI 33.47 kg/m   Physical Exam Vitals and nursing note reviewed.  Constitutional:      General: She is not in acute distress.    Appearance: Normal appearance. She is not ill-appearing, toxic-appearing or diaphoretic.  HENT:     Head: Normocephalic and atraumatic.     Right Ear: External ear normal.     Left Ear: External ear normal.     Nose: Nose normal.     Mouth/Throat:     Mouth: Mucous membranes are moist.     Pharynx: Oropharynx is clear. No oropharyngeal exudate or posterior oropharyngeal erythema.  Eyes:     Extraocular Movements:  Extraocular movements intact.  Cardiovascular:     Rate and Rhythm: Normal rate and regular rhythm.     Pulses: Normal pulses.     Heart sounds: Normal heart sounds. No murmur heard. No friction rub. No gallop.   Pulmonary:     Effort: Pulmonary effort is normal. No respiratory distress.     Breath sounds: Normal breath sounds. No stridor. No wheezing, rhonchi or rales.  Abdominal:     General: Abdomen is flat.     Palpations: Abdomen is soft.     Tenderness: There is no abdominal tenderness.     Comments: Protuberant abdomen that is soft and nontender in all 4 quadrants.  Musculoskeletal:        General: Normal range of motion.     Cervical back: Normal range of motion and neck supple. No tenderness.  Skin:    General: Skin is warm and dry.  Neurological:     General: No focal deficit present.     Mental Status: She is alert and oriented to person, place, and time.  Psychiatric:        Mood and Affect: Mood is anxious. Affect is tearful.        Behavior: Behavior is agitated and withdrawn.    ED Results / Procedures / Treatments   Labs (all labs ordered are listed, but only abnormal results are displayed) Labs Reviewed  CBC WITH DIFFERENTIAL/PLATELET - Abnormal; Notable for the following components:      Result Value   RBC 5.44 (*)    MCH 25.7 (*)    RDW 15.9 (*)    All other components within normal limits  URINALYSIS, ROUTINE W REFLEX MICROSCOPIC - Abnormal; Notable for the following components:   Color, Urine AMBER (*)    APPearance CLOUDY (*)    Glucose, UA 50 (*)    Ketones, ur 20 (*)    Protein, ur 100 (*)    Bacteria, UA FEW (*)    All other components within normal limits  SALICYLATE LEVEL - Abnormal; Notable for the following components:   Salicylate Lvl <7.0 (*)    All other components within normal limits  ACETAMINOPHEN LEVEL - Abnormal; Notable for the following components:   Acetaminophen (Tylenol), Serum <10 (*)    All other components within normal  limits  COMPREHENSIVE METABOLIC PANEL - Abnormal; Notable for the following components:   Sodium 134 (*)    Potassium 3.1 (*)    CO2 21 (*)    Glucose, Bld 177 (*)    BUN <5 (*)    AST 14 (*)    Total Bilirubin 1.5 (*)    All other components within normal limits  RESP PANEL BY RT-PCR (FLU A&B, COVID) ARPGX2  RAPID URINE DRUG SCREEN, HOSP PERFORMED  ETHANOL  I-STAT  BETA HCG BLOOD, ED ([facility], [facility], [facility] ONLY)   EKG None  Radiology CT Head Wo Contrast  Result Date: 04/24/2021 CLINICAL DATA:  Altered mental status EXAM: CT HEAD WITHOUT CONTRAST TECHNIQUE: Contiguous axial images were obtained from the base of the skull through the vertex without intravenous contrast. COMPARISON:  None. FINDINGS: Brain: No evidence of acute infarction, hemorrhage, hydrocephalus, extra-axial collection or mass lesion/mass effect. Vascular: No hyperdense vessel or unexpected calcification. Skull: Normal. Negative for fracture or focal lesion. Sinuses/Orbits: No acute finding. Other: None. IMPRESSION: No acute intracranial findings. Electronically Signed   By: Duanne Guess D.O.   On: 04/24/2021 12:39    Procedures Procedures   Medications Ordered in ED Medications  sodium chloride 0.9 % bolus 500 mL (has no administration in time range)  ziprasidone (GEODON) injection 10 mg (10 mg Intramuscular Given 04/24/21 0840)  sterile water (preservative free) injection (1.2 mLs  Given 04/24/21 0841)  potassium chloride SA (KLOR-CON) CR tablet 40 mEq (40 mEq Oral Given 04/24/21 1114)  ziprasidone (GEODON) injection 10 mg (10 mg Intramuscular Given 04/24/21 1119)  LORazepam (ATIVAN) injection 1 mg (1 mg Intravenous Given 04/24/21 1147)  sterile water (preservative free) injection (10 mLs  Given 04/24/21 1119)   ED Course  I have reviewed the triage vital signs and the nursing notes.  Pertinent labs & imaging results that were available during my care of the patient were reviewed by me and considered in my medical  decision making (see chart for details).    MDM Rules/Calculators/A&P                          Pt is a 30 y.o. female who presents to the emergency department due to altered mental status.  Labs: CBC with RBCs of 5.44, MCH of 25.7, RDW of 15.9. CMP with a sodium of 134, potassium of 3.1, CO2 of 21, glucose of 177, BUN less than 5, AST of 14, total bilirubin of 1.5. I-STAT beta-hCG less than 5. Ethanol less than 10. Acetaminophen less than 10. Salicylate less than 7. Respiratory panel is negative. UDS negative. UA shows 50 glucose, 20 ketones, 100 protein, few bacteria.  Imaging: CT scan of the head is negative for acute intracranial abnormalities.  I, Placido Sou, PA-C, personally reviewed and evaluated these images and lab results as part of my medical decision-making.  Patient has been experiencing worsening anhedonia for the past year after dealing with bullying at work.  Her mother states that she is having increasing difficulty with her ADLs and now has become nonverbal, will not feed herself, and will not bathe her self.  On my initial evaluation patient was screaming and rolling around the bed.  She was given IM Geodon which mildly improved her symptoms.  She is still nonverbal but at times has provided brief answers to questions to the nursing staff.  She appears A&O x3.  Is following commands when asked throughout my physical exam.  I have obtained an extensive work-up as well as a CT scan of the head.  Her lab work and imaging is generally reassuring.  Feel that patient is medically cleared at this time.  Feel the patient will benefit from TTS evaluation and likely inpatient treatment.  This was discussed with her mother at bedside and she is agreeable with this plan.  TTS consult was pending.  Disposition pending TTS recommendations.  Note: Portions of this report may have been transcribed using voice recognition software. Every  effort was made to ensure accuracy;  however, inadvertent computerized transcription errors may be present.   Final Clinical Impression(s) / ED Diagnoses Final diagnoses:  Altered mental status, unspecified altered mental status type   Rx / DC Orders ED Discharge Orders    None       Placido Sou, PA-C 04/24/21 1349    Benjiman Core, MD 04/24/21 1510

## 2021-04-24 NOTE — ED Notes (Signed)
Pt trying to climb off CT table, this RN went & gave IM Geodon.

## 2021-04-24 NOTE — ED Notes (Signed)
Pt undergoing TTS at this time 

## 2021-04-24 NOTE — BH Assessment (Addendum)
Nira Conn, NP, determined pt meets inpatient criteria. This information was relayed to [facility], RN, who reviewed pt's chart and at 2148 communicated that [facility] currently has no appropriate beds for pt. [name] referral information will be faxed out to multiple hospitals for potential placement by SW. This information was relayed to pt's team at 2240. Patient faxed out to multiple hospitals by Disposition Counselor for consideration of bed placement. See hospitals listed below:  [facility]      [facility] Details     [facility]     [facility] Details     [facility] Details     [facility] Details     [facility] Details     [facility] Details     [facility] Details     [facility] Details     [facility] Details     [facility] Details     [facility] Details     [facility] Details     [facility] Details     [facility] Details     [facility] Details     [facility] Details     [facility] Details     [facility] Details     [facility] Details     [facility]

## 2021-04-24 NOTE — Progress Notes (Signed)
Patient meets inpatient criteria. Plan of care explained to patient. Pt changed into paper scrubs (no burgundy scrubs available). BH packet completed. Pt cooperative, calm and appears appreciative. No needs at this time.

## 2021-04-24 NOTE — ED Notes (Signed)
Patient meets inpatient criteria. Plan of care explained to patient. Pt changed into paper scrubs (no burgundy scrubs available). BH packet completed. Pt cooperative, calm and appears appreciative. No needs at this time.  

## 2021-04-24 NOTE — ED Notes (Signed)
Pt given puzzle books, coloring sheets, crayons and markers as requested.

## 2021-04-24 NOTE — BH Assessment (Signed)
Per Hassie Bruce, RN no available beds at [facility]. Disposition CSW to seek placement. Disposition discussed with Placido Sou, PA-C and Vonna Kotyk, RN via secure chat.     Redmond Pulling, MS, [facility], [facility] Triage Specialist [phone]

## 2021-04-24 NOTE — BH Assessment (Addendum)
Comprehensive Clinical Assessment (CCA) Note  04/24/2021 JOSEY DETTMANN [id number]  Recommendations for Services/Supports/Treatments: Nira Conn, NP, reviewed pt's chart and information and determined pt meets inpatient criteria. This information was relayed to [facility], RN, who reviewed pt's chart and at 2148 communicated that [facility] currently has no appropriate beds for pt. [name] referral information will be faxed out to multiple hospitals for potential placement by SW. This information was relayed to pt's team at 2240.  The patient demonstrates the following risk factors for suicide: Chronic risk factors for suicide include: psychiatric disorder of MDD, Recurrent, Severe and medical illness catatonia. Acute risk factors for suicide include: unemployment, social withdrawal/isolation and loss (financial, interpersonal, professional). Protective factors for this patient include: positive social support, coping skills and hope for the future. Considering these factors, the overall suicide risk at this point appears to be none. Patient is not appropriate for outpatient follow up.  Therefore, no sitter is recommended for suicide precautions.  Flowsheet Row ED from 04/24/2021 in [facility] EMERGENCY DEPARTMENT ED from 04/14/2021 in [facility] at [facility] from 04/02/2021 in [facility]  C-SSRS RISK CATEGORY No Risk No Risk No Risk     Chief Complaint:  Chief Complaint  Patient presents with  . Tremors  . Altered Mental Status   Visit Diagnosis: F33.2, Major depressive disorder, Recurrent episode, Severe   CCA Screening, Triage and Referral (STR) Zykerria Tanton is a 30 year old patient who was brought to [facility] by her mother due to pt's ongoing symptoms, including catatonia, not speaking, and her inability to independently complete her ADLs. Pt was seen 04/14/2021 at [facility] for similar symptoms.  Pt denies SI, a hx of SI,  any hx of attempting to kill herself or a plan to kill herself, or any prior hospitalizations for mental health concerns. Pt denies HI, AVH, NSSIB, access to guns/weapons, engagement with the legal system, or SA.  Pt was able to verbally answer the questions posed by clinician, though she paused, as if thinking, prior to answering each question. [name] voice was soft and muffled by her mask, which made it difficult to understand some of her answers at times.  Pt is oriented x5. Her recent/remote memory is intact. Pt was cooperative throughout the assessment process. [name] insight, judgement, and impulse control is fair at this time.  Patient Reported Information How did you hear about [place]? Family/Friend  Referral name: Yatzari Jonsson, mother: [phone]  Referral phone number: [phone]   Whom do you see for routine medical problems? Primary Care  Practice/Facility Name: Internal Medicine  Practice/Facility Phone Number: [phone] (Unknown)  Name of Contact: Various providers  Contact Number: Unknown  Contact Fax Number: Unknown  Prescriber Name: Various  Prescriber Address (if known): Unknown   What Is the Reason for Your Visit/Call Today? From what clinician could understand, pt states she has been having difficulties sleeping. The notes in pt's chart state pt has had periods of catanoia and paralysis.  How Long Has This Been Causing You Problems? 1-6 months  What Do You Feel Would Help You the Most Today? Treatment for Depression or other mood problem; Medication(s)   Have You Recently Been in Any Inpatient Treatment (Hospital/Detox/Crisis Center/28-Day Program)? No  Name/Location of Program/Hospital:No data recorded How Long Were You There? No data recorded When Were You Discharged? No data recorded  Have You Ever Received Services From [facility] Before? Yes  Who Do You See at [facility]? Various  providers at Internal Medicine   Have You Recently Had Any Thoughts About  Hurting Yourself? No  Are You Planning to Commit Suicide/Harm Yourself At This time? No   Have you Recently Had Thoughts About Hurting Someone Karolee Ohslse? No  Explanation: No data recorded  Have You Used Any Alcohol or Drugs in the Past 24 Hours? No  How Long Ago Did You Use Drugs or Alcohol? No data recorded What Did You Use and How Much? No data recorded  Do You Currently Have a Therapist/Psychiatrist? Yes  Name of Therapist/Psychiatrist: Pt was unable to provide the names/practices of her provider(s)   Have You Been Recently Discharged From Any Office Practice or Programs? No  Explanation of Discharge From Practice/Program: No data recorded    CCA Screening Triage Referral Assessment Type of Contact: Tele-Assessment  Is this Initial or Reassessment? Initial Assessment  Date Telepsych consult ordered in [facility]:  04/24/2021  [facility]   Patient Reported Information Reviewed? Yes  Patient Left Without Being Seen? No data recorded Reason for Not Completing Assessment: No data recorded  Collateral Involvement: Marcellina Millinochelle Kell, mother   Does Patient Have a Automotive engineerCourt Appointed Legal Guardian? No data recorded Name and Contact of Legal Guardian: No data recorded If Minor and Not Living with Parent(s), Who has Custody? N/A  Is CPS involved or ever been involved? Never  Is APS involved or ever been involved? Never   Patient Determined To Be At Risk for Harm To Self or Others Based on Review of Patient Reported Information or Presenting Complaint? No  Method: No data recorded Availability of Means: No data recorded Intent: No data recorded Notification Required: No data recorded Additional Information for Danger to Others Potential: No data recorded Additional Comments for Danger to Others Potential: No data recorded Are There Guns or Other Weapons in Your Home? No data recorded Types of Guns/Weapons: No data recorded Are These Weapons Safely  Secured?                            No data recorded Who Could Verify You Are Able To Have These Secured: No data recorded Do You Have any Outstanding Charges, Pending Court Dates, Parole/Probation? No data recorded Contacted To Inform of Risk of Harm To Self or Others: -- (N/A)   [facility]   Does Patient Present under Involuntary Commitment? No  IVC Papers Initial File Date: No data recorded  [place]   Patient Currently Receiving the Following Services: Medication Management; Individual Therapy   Determination of Need: Emergent (2 hours)   Options For Referral: Medication Management; Inpatient Hospitalization; Outpatient Therapy     CCA Biopsychosocial Intake/Chief Complaint:  From what clinician could understand, pt states she has been having difficulties sleeping. The notes in pt's chart state pt has had periods of catanoia and paralysis.  Current Symptoms/Problems: Pt shares she has been having trouble sleeping. Her chart notes pt has had incidents of catatonia.   Patient Reported Schizophrenia/Schizoaffective Diagnosis in Past: No   Strengths: When pt was not sick, she had a talkative, outgoing personality.  Preferences: Not assessed  Abilities: Not assessed   Type of Services Patient Feels are Needed: Inpatient hospitalization   Initial Clinical Notes/Concerns: None noted   Mental Health Symptoms Depression:  Change in energy/activity; Weight gain/loss; Sleep (too much or little)   Duration of Depressive symptoms: Greater than two weeks  Mania:  None   Anxiety:   Tension   Psychosis:  Grossly disorganized or catatonic behavior   Duration of Psychotic symptoms: Less than six months   Trauma:  None   Obsessions:  None   Compulsions:  None   Inattention:  None   Hyperactivity/Impulsivity:  N/A   Oppositional/Defiant Behaviors:  None   Emotional Irregularity:  None   Other Mood/Personality  Symptoms:  None noted    Mental Status Exam Appearance and self-care  Stature:  Average   Weight:  Average weight   Clothing:  Casual   Grooming:  Normal   Cosmetic use:  Age appropriate   Posture/gait:  Normal   Motor activity:  Not Remarkable   Sensorium  Attention:  Normal   Concentration:  Normal   Orientation:  X5   Recall/memory:  Normal   Affect and Mood  Affect:  Appropriate   Mood:  Euthymic   Relating  Eye contact:  Normal   Facial expression:  Responsive   Attitude toward examiner:  Cooperative   Thought and Language  Speech flow: Slow; Soft   Thought content:  Appropriate to Mood and Circumstances   Preoccupation:  None   Hallucinations:  None   Organization:  No data recorded  [employer] of Knowledge:  Average   Intelligence:  Average   Abstraction:  Normal   Judgement:  Good   Reality Testing:  Adequate   Insight:  Poor   Decision Making:  Normal   Social Functioning  Social Maturity:  Isolates   Social Judgement:  Normal   Stress  Stressors:  Work   Coping Ability:  Set designer Deficits:  Communication; Self-care   Supports:  Family     Religion: Religion/Spirituality Are You A Religious Person?: No How Might This Affect Treatment?: Not assessed  Leisure/Recreation: Leisure / Recreation Do You Have Hobbies?: Yes Leisure and Hobbies: Not assessed  Exercise/Diet: Exercise/Diet Do You Exercise?: No Have You Gained or Lost A Significant Amount of Weight in the Past Six Months?: No Do You Follow a Special Diet?: No Do You Have Any Trouble Sleeping?: Yes Explanation of Sleeping Difficulties: Pt reports she hasn't been able to sleep.   CCA Employment/Education Employment/Work Situation: Employment / Work Situation Employment situation: Unemployed Patient's job has been impacted by current illness: Yes Describe how patient's job has been impacted: Client's mother reported at the  client's previous job she had an emotional relationship with her boss. Mother reported when the client's coworkers found out she was taunted and bullied. Mother reported that job was a year ago and that was the start of the client's withdrawn behaviors. What is the longest time patient has a held a job?: Not assessed Where was the patient employed at that time?: Not assessed Has patient ever been in the [employer]?:  (Not assessed)  Education: Education Is Patient Currently Attending School?: No Last Grade Completed: 13 (Some college at [employer]) Name of High School: Clinician could not understand pt's answer to this question Did You Graduate From [employer]?: Yes Did You Attend College?: Yes What Type of College Degree Do you Have?: No degree, but pt studied Early Childhood Education at [employer] Did You Attend Graduate School?: No What Was Your Major?: Early Childhood Education - pt states she wanted to be a Midwife Did You Have Any Special Interests In School?: None noted Did You Have An Individualized Education Program (IIEP): Yes Did You Have Any Difficulty At School?:  (  Not assessed) Patient's Education Has Been Impacted by Current Illness:  (Not assessed)   CCA Family/Childhood History Family and Relationship History: Family history Marital status: Single Are you sexually active?:  (Not assessed) What is your sexual orientation?: Not assessed Has your sexual activity been affected by drugs, alcohol, medication, or emotional stress?: Not assessed Does patient have children?: No  Childhood History:  Childhood History By whom was/is the patient raised?: Mother Additional childhood history information: Client reported she was raised by her mother. Client reported she childhood was "normal". Client reported her dad was somewhat in her life. Client reported no relationship with him now. Good relationship with her mother. Description of patient's relationship with caregiver when  they were a child: Good Patient's description of current relationship with people who raised him/her: Good How were you disciplined when you got in trouble as a child/adolescent?: Not assessed Does patient have siblings?: Yes Number of Siblings: 1 Description of patient's current relationship with siblings: Good Did patient suffer any verbal/emotional/physical/sexual abuse as a child?: No Did patient suffer from severe childhood neglect?: No Has patient ever been sexually abused/assaulted/raped as an adolescent or adult?: No Was the patient ever a victim of a crime or a disaster?: No Witnessed domestic violence?: No Has patient been affected by domestic violence as an adult?: No  Child/Adolescent Assessment:     CCA Substance Use Alcohol/Drug Use: Alcohol / Drug Use Pain Medications: See MAR Prescriptions: See MAR Over the Counter: See MAR History of alcohol / drug use?: No history of alcohol / drug abuse Longest period of sobriety (when/how long): N/A Negative Consequences of Use:  (N/A) Withdrawal Symptoms:  (N/A)                         ASAM's:  Six Dimensions of Multidimensional Assessment  Dimension 1:  Acute Intoxication and/or Withdrawal Potential:      Dimension 2:  Biomedical Conditions and Complications:      Dimension 3:  Emotional, Behavioral, or Cognitive Conditions and Complications:     Dimension 4:  Readiness to Change:     Dimension 5:  Relapse, Continued use, or Continued Problem Potential:     Dimension 6:  Recovery/Living Environment:     ASAM Severity Score:    ASAM Recommended Level of Treatment: ASAM Recommended Level of Treatment:  (N/A)   Substance use Disorder (SUD) Substance Use Disorder (SUD)  Checklist Symptoms of Substance Use:  (N/A)  Recommendations for Services/Supports/Treatments: Recommendations for Services/Supports/Treatments Recommendations For Services/Supports/Treatments: Medication Management,Individual  Therapy,Inpatient Hospitalization  Nira Conn, NP, reviewed pt's chart and information and determined pt meets inpatient criteria. This information was relayed to [facility], RN, who reviewed pt's chart and at 2148 communicated that [facility] currently has no appropriate beds for pt. [name] referral information will be faxed out to multiple hospitals for potential placement by SW. This information was relayed to pt's team at 2240.  DSM5 Diagnoses: Patient Active Problem List   Diagnosis Date Noted  . Odynophagia 07/07/2016  . Iron deficiency anemia 02/26/2015  . S/P tonsillectomy and adenoidectomy 02/26/2015  . Healthcare maintenance 02/26/2015    Patient Centered Plan: Patient is on the following Treatment Plan(s):  Anxiety and Depression   Referrals to Alternative Service(s): Referred to Alternative Service(s):   Place:   Date:   Time:    Referred to Alternative Service(s):   Place:   Date:   Time:    Referred to Alternative Service(s):   Place:  Date:   Time:    Referred to Alternative Service(s):   Place:   Date:   Time:     Dannielle Burn, LMFT

## 2021-04-24 NOTE — ED Triage Notes (Signed)
Pt was brought in by her mother who indicated that pt has had tremors and been non-verbal since yesterday morning.  Pt is currently seeking mental health treatment.  Mom reports constant headaches, refused to go outside in the bright light, no appetite.

## 2021-04-24 NOTE — ED Notes (Signed)
Patient transported to CT 

## 2021-04-24 NOTE — ED Notes (Signed)
This RN spoke with mother on the phone & updated her about pt status. Pt was calm & looking around, CT came by to assess her & she has been transported over to CT for 2nd attempt.

## 2021-04-25 LAB — CBG MONITORING, ED: Glucose-Capillary: 119 mg/dL — ABNORMAL HIGH (ref 70–99)

## 2021-04-25 MED ORDER — LORAZEPAM 2 MG/ML IJ SOLN
1.0000 mg | Freq: Once | INTRAMUSCULAR | Status: AC
  Administered 2021-04-25: 1 mg via INTRAVENOUS
  Filled 2021-04-25: qty 1

## 2021-04-25 MED ORDER — SODIUM CHLORIDE 0.9 % IV BOLUS
1000.0000 mL | Freq: Once | INTRAVENOUS | Status: AC
  Administered 2021-04-25: 1000 mL via INTRAVENOUS

## 2021-04-25 MED ORDER — ZIPRASIDONE MESYLATE 20 MG IM SOLR
20.0000 mg | INTRAMUSCULAR | Status: AC | PRN
  Administered 2021-04-26: 20 mg via INTRAMUSCULAR
  Filled 2021-04-25: qty 20

## 2021-04-25 MED ORDER — LORAZEPAM 1 MG PO TABS: 1.0000 mg | ORAL_TABLET | ORAL | Status: DC | PRN

## 2021-04-25 MED ORDER — OLANZAPINE 5 MG PO TBDP
10.0000 mg | ORAL_TABLET | Freq: Three times a day (TID) | ORAL | Status: DC | PRN
  Filled 2021-04-25: qty 2

## 2021-04-25 NOTE — ED Notes (Signed)
Cheryl Adams, mother [phone] would like an update asap

## 2021-04-25 NOTE — ED Notes (Signed)
Placed pt on cardiac monitoring at this time due to elevated pulse rate.

## 2021-04-25 NOTE — BH Assessment (Signed)
This Probation officer met with patient this date to evaluate current mental health status. Patient remains non-verbal and is observed to be sitting on the edge of the bed with hands outstretched palms up in a fixed position. This Probation officer attempted to interact with patient unsuccessfully as Cheryl Resides NP recommends a continued inpatient admission.

## 2021-04-25 NOTE — Progress Notes (Signed)
Patient information has been sent to [facility] via secure chat to review for potential admission. Patient meets inpatient criteria per Nira Conn, NP.   Situation ongoing, CSW will continue to monitor progress.    Signed:  Damita Dunnings, MSW, LCSW-A  04/25/2021 10:08 AM

## 2021-04-25 NOTE — ED Notes (Addendum)
This EMT heard a loud scream and a thud come from room 36. When this EMT entered the room, the patient was face down on the floor. A sitter for another patient, who was closer to the event, assessed the patient's pulse and breathing. Once determined the patient had a bounding pulse and was adequately breathing, a team of four people lifted the patient back into bed. The patient is now resting comfortably.

## 2021-04-25 NOTE — ED Provider Notes (Signed)
Blood pressure 128/80, pulse (!) 128, temperature 98.9 F (37.2 C), temperature source Oral, resp. rate (!) 31, height 5\' 4"  (1.626 m), weight 88.5 kg, last menstrual period 04/07/2021, SpO2 100 %.  In short, Cheryl Adams is a 30 y.o. female with a chief complaint of Tremors and Altered Mental Status .  Refer to the original H&P for additional details.  01:35 PM  Aware by the patient's nurse that she has developed tachycardia.  She is calm and continues to be nonverbal.  I reviewed the patient's emergency department note and lab work from yesterday.  I did not see a history of alcohol use/abuse.  Did require Ativan and Geodon yesterday.  No lab signs of dehydration or ongoing infection.  Behavioral health plans to place the patient in an inpatient psychiatric unit.   I went to evaluate the patient and she is sitting in bed with eyes closed and not responding verbally.  She is not diaphoretic or in distress.  She is well perfused.  She is on the monitor and appears to be in sinus tachycardia.  We will check a temp and repeat an EKG.   EKG shows sinus tachycardia similar to her prior EKG from yesterday.  The nurses telling me she is not eating today.  She said very little to drink.  Question some component of dehydration.  Plan for IV fluid bolus and continue supportive care for psychiatry placement. Patient does not appear anxious or agitated.    EKG Interpretation  Date/Time:  Friday Apr 25 2021 13:40:26 EDT Ventricular Rate:  116 PR Interval:  106 QRS Duration: 97 QT Interval:  319 QTC Calculation: 444 R Axis:   59 Text Interpretation: Sinus tachycardia Similar to prior Confirmed by [id number] [phone]) on 04/25/2021 2:08:26 PM         [name], 04/27/2021, MD 04/25/21 1408

## 2021-04-25 NOTE — ED Notes (Signed)
Pt is resting at this time

## 2021-04-25 NOTE — ED Notes (Signed)
Cheryl Adams,mother, calling upset would like someone to give her an update on the patient [phone]

## 2021-04-25 NOTE — ED Notes (Signed)
Received report from Christopher at this time 

## 2021-04-25 NOTE — ED Notes (Signed)
Update given to mother.

## 2021-04-25 NOTE — Progress Notes (Signed)
Patient has been declined by [facility] and therefore the patient has been referred out. Patient meets inpatient criteria per Cheryl Adams ,NP. Patient referred to the following facilities:  [facility]  [address] [place] [address] [phone]  [facility]  [address] [place] [address] [phone]  [facility]  [address] [place], [place] [address] [phone]  [facility]  [address] [place] [address] [phone]  [facility]  [phone] [place] [address] [phone]  [facility]  [address], [place] [address] [phone]  [facility]  [address] [place], [place] [address] [phone]  [facility]  [address] [place] [address] [phone]  [facility]  [address] [place] [address] [phone]  [facility]  [address] [place] [address] [phone]  [facility]  [address], [place] [address] [phone]  [facility]  [address], [place] [address] [phone]  [facility]  [address], [place] [address] [phone]  [facility]  [address], [place] [address] [phone]  [facility]  [address] [place] [address] [phone]  [facility]  [address] [place] [address] [phone]  [facility]  [address], [place] [address] [phone]  [facility]  [address], [place] [address]  [phone]  [facility]  [address], [place] [address] [phone]  [facility]  [address] [place] [address] [phone]  [facility]  [address] [place] [address] [phone]  [facility]  [address] [place] [address] [phone]    CSW will continue to monitor disposition.   Damita Dunnings, MSW, LCSW-A  12:56 PM 04/25/2021

## 2021-04-26 ENCOUNTER — Encounter: Payer: Self-pay | Admitting: *Deleted

## 2021-04-26 MED ORDER — OLANZAPINE 5 MG PO TBDP
5.0000 mg | ORAL_TABLET | Freq: Every day | ORAL | Status: DC
  Administered 2021-04-26 – 2021-04-27 (×2): 5 mg via ORAL
  Filled 2021-04-26 (×2): qty 1

## 2021-04-26 MED ORDER — TEMAZEPAM 15 MG PO CAPS
15.0000 mg | ORAL_CAPSULE | Freq: Every day | ORAL | Status: DC
  Administered 2021-04-26: 15 mg via ORAL
  Filled 2021-04-26: qty 2

## 2021-04-26 NOTE — ED Notes (Signed)
Pt returned to room from restroom at this time 

## 2021-04-26 NOTE — ED Notes (Signed)
Refused breakfast at this time. Took a few sips out of 1 cup ginger ale. Pt states she has no appetite and refuses any food at this time.

## 2021-04-26 NOTE — ED Notes (Signed)
Pt requested hamburger and fries at this time. Dietary order placed.

## 2021-04-26 NOTE — ED Notes (Signed)
Pt ate 45% of lunch. Pt refused breakfast this AM. Asked by this RN what she wanted to eat. Pt stated hamburger and fries. Pt also drank a cup of ginger ale. RN asked pt if there are any foods she like for dinner. Pt states she wants pizza for dinner. Will try to order with dietary to see if pizza is available. Provider, Ophelia Shoulder, NP updated with pt status as well. Will continue to monitor. Safety precautions maintained. Pt observed sitting on bed since this AM. Refused to watch tv or color some pages. Multiple activities offered to pt by this RN. Pt remains calm. Refused to take phone calls at this time from family. Pt states, "I will call them later. I don't feel like talking on the phone at this time." Pt was encouraged to tell RN if she wants to do some activities, watch tv or call her family.

## 2021-04-26 NOTE — ED Notes (Signed)
Pt up to nursing desk requesting something to drink

## 2021-04-26 NOTE — BH Assessment (Addendum)
Writer reassessed patient today . Patient very soft spoken but denies SI/ HI but endorsed AVH . Patient reports she is unable to recognize the voices or objects she sees . When asked if voices are giving commands patient reports No. Patient requested hamburger and french fires and for lunch and patient reports eating lunch today but refused break fast. When writer asked patient how she felt we could help her with the voices she responded medications. Patient was calm and cooperative during reassessment . Per Ophelia Shoulder, NP patient is still recommended for inpatient.

## 2021-04-26 NOTE — ED Notes (Signed)
Pt up pacing back in forth in front of the nurses station

## 2021-04-26 NOTE — ED Notes (Signed)
Pt ambulating to rest room.

## 2021-04-26 NOTE — ED Notes (Signed)
Pt up to nurses station requesting to have temp taken. VS obtained by ED tech

## 2021-04-26 NOTE — ED Notes (Signed)
Pt ordered pizza for dinner. Ate 50% of her dinner tray. Will continue to monitor. Spoke to pt mom at this time and gave her update regarding pt treatment and improvement in appetite.

## 2021-04-26 NOTE — ED Notes (Signed)
Pt continues to pace back and forth in front of nurses station. Noted tremors. Asked pt if she would take some medication for the agitation and tremors and pt advises that no she didn't want anything and wouldn't take anything

## 2021-04-26 NOTE — ED Notes (Signed)
Paged security so we can have them at bedside to admin medication. Returned call and made them aware. Spoke with charge nurse as well

## 2021-04-26 NOTE — ED Notes (Signed)
Spoke to pt mom at this time and status/treatment update relayed to mom. Mom states she has been calling for days and no one has updated her with pt status. Pt mom reassured by this RN to relay the message to upcoming shift and she can call any time for updates regarding pt status.

## 2021-04-27 DIAGNOSIS — F061 Catatonic disorder due to known physiological condition: Secondary | ICD-10-CM

## 2021-04-27 DIAGNOSIS — F329 Major depressive disorder, single episode, unspecified: Secondary | ICD-10-CM

## 2021-04-27 LAB — I-STAT BETA HCG BLOOD, ED (MC, WL, AP ONLY): I-stat hCG, quantitative: <5 m[IU]/mL (ref <5)

## 2021-04-27 LAB — POC SARS CORONAVIRUS 2 AG -  ED: SARSCOV2ONAVIRUS 2 AG: NEGATIVE

## 2021-04-27 MED ORDER — POTASSIUM CHLORIDE CRYS ER 20 MEQ PO TBCR
40.0000 meq | EXTENDED_RELEASE_TABLET | Freq: Once | ORAL | Status: AC
  Administered 2021-04-27: 40 meq via ORAL
  Filled 2021-04-27: qty 2

## 2021-04-27 MED ORDER — LORAZEPAM 2 MG/ML IJ SOLN
INTRAMUSCULAR | Status: AC
  Administered 2021-04-27: 2 mg via INTRAMUSCULAR
  Filled 2021-04-27: qty 1

## 2021-04-27 MED ORDER — LORAZEPAM 1 MG PO TABS: 2.0000 mg | ORAL_TABLET | Freq: Once | ORAL | Status: AC

## 2021-04-27 MED ORDER — LORAZEPAM 1 MG PO TABS
2.0000 mg | ORAL_TABLET | Freq: Once | ORAL | Status: DC
  Filled 2021-04-27: qty 4

## 2021-04-27 MED ORDER — LORAZEPAM 2 MG/ML IJ SOLN: 2.0000 mg | Freq: Once | INTRAMUSCULAR | Status: AC

## 2021-04-27 MED ORDER — LORAZEPAM 2 MG/ML IJ SOLN
2.0000 mg | Freq: Once | INTRAMUSCULAR | Status: DC
  Filled 2021-04-27: qty 1

## 2021-04-27 NOTE — ED Notes (Signed)
Upon further talking with pt she disclosed that she was fired from [employer] for "eating free food", I asked her if she meant that she was eating food that she did not pay for and she stated "yes", when I asked her if she knew she was supposed to pay for the food, she stated "yes"

## 2021-04-27 NOTE — ED Notes (Signed)
Walking around nurses station.  She is aware not to go past nurses station into any other area.  She is compliant with this.  She voices no complaints. She is calm and comfortable.

## 2021-04-27 NOTE — ED Notes (Signed)
Wandering around nurses station - requesting hand sanitizer. I visualized pt using hand sanitizer and wiping down her products that she was given this morning for her shower. I explained to pt that this was not necessary - she continued to wipe the products down.  Sanitizer was taken away from pt when she completed this.  Pt remained calm and cooperative

## 2021-04-27 NOTE — ED Notes (Signed)
Pt has complaints of "feeling weird" and "feels hot'. Full set of vitals taken and WDL. Pt encouraged to lay down and get rest. Pt up at nurses station wanting to stand.

## 2021-04-27 NOTE — ED Notes (Signed)
Hi. This pt told me this morning, with a lot of prodding, that her depression "it's surface" her words.  As I prodded further - she states that she recently found out that someone she likes - a 29 yo man, does not feel the same about her, they never had a relationship. She works at [employer] and lives with her sister, she denies suicidal ideations. we discussed the effects of a broken heart .Marland KitchenMarland KitchenMarland KitchenMarland Kitchen

## 2021-04-27 NOTE — ED Notes (Signed)
This RN attempted to give pt oral ativan, as she was agreeable to taking it with applesauce. When attempting to administer medication, pt flung the applesauce and medication at the wall. Security at bedside.

## 2021-04-27 NOTE — ED Notes (Signed)
Attempting to get in contact with [facility] provider at [facility] for PRN medication orders.

## 2021-04-27 NOTE — Consult Note (Signed)
Sent message to RN Jasper Loser to see patient for tele psychiatry.

## 2021-04-27 NOTE — ED Notes (Signed)
Pt requesting more coloring books.  I gave her more coloring books and a simple jig saw puzzle.

## 2021-04-27 NOTE — Progress Notes (Signed)
NP was advised by RN Tiburcio Pea that patient has been restless, sitting on the floor and not sleeping tonight. Ordered one time dose ativan 2mg  po or ativan 2mg  IV if Pt. unable to take orally.

## 2021-04-27 NOTE — ED Notes (Signed)
She just finished having a five minute phone call with her mother. No complaints.

## 2021-04-27 NOTE — ED Notes (Signed)
Report given to [facility]- Holding on meds to get through admission process at this time.

## 2021-04-27 NOTE — Progress Notes (Signed)
Per Vivien Presto, patient meets criteria for inpatient treatment. There are no available or appropriate beds at [facility] today. CSW faxed referrals to the following facilities for review:  [facility]  [facility]  [facility]  [facility]  [facility]  [facility]  [facility]  [facility]  [facility]  [facility]  [facility]  [facility]  [facility]  TTS will continue to seek bed placement.  Crissie Reese, MSW, LCSW-A, LCAS-A Phone: [phone] Disposition/TOC

## 2021-04-27 NOTE — ED Notes (Signed)
Called [facility] to take patient to [facility]

## 2021-04-27 NOTE — BH Assessment (Addendum)
Pt has been accepted at [facility] by [facility] RN and can arrive immediately. This information was relayed to pt's providers at 2120.  Room: 508-1 Accepting: Nira Conn, NP Attending: Dr. Mason Jim Call to Report: [phone]

## 2021-04-27 NOTE — Consult Note (Signed)
Telepsych Consultation   Reason for Consult:  Psychiatric Reassessment Referring Physician:  Placido Sou, PA-C  Location of Patient:  [facility] Location of Provider: Other: virtual, home office  Patient Identification: Cheryl Adams MRN:  [id number] Principal Diagnosis: MDD (major depressive disorder), single episode with catatonic features Diagnosis:  Principal Problem:   MDD (major depressive disorder), single episode with catatonic features   Total Time spent with patient: 30 minutes  Subjective:   Cheryl Adams is a 30 y.o. female patient admitted with tremors and altered mental status. The patient states, "I am feeling a little off but okay."  This is a psychiatric reassessment note.  Patient seen via telepsych by this provider; chart reviewed and consulted with Dr. Lucianne Muss on 04/27/21.  On evaluation Cheryl Adams reports is seen sitting on a chair at the bedside.  She states her name, date of birth and discloses her location when asked, "I'm at [facility]."  She states she feels better today but still, "off"  She appears more clear today than she was previously.  She's communicating more and eating meals.  Also completing ADLs.  She continues to report visual and audible hallucinations.  She remains unable to describe them but states they are decreasing in severity; denies command hallucinations.  Per notes, she has been communicating more with nursing staff.  She accepts po medications, was a bit hesitant to take prn ativan this morning, apparenty she flung the applesauce which contained meds at the wall. Eventually took it when security arrived at the bedside.    States she has not called her mother.  She is invited to ask the nurse to use the phone if she wants to talk with her.  Patient demonstrates improvement but not at baseline.  Patient given support and encouragement throughout and given the opportunity to ask questions.    HPI:  Per EDP Admission Note  04/24/2021: Chief Complaint  Patient presents with  . Tremors  . Altered Mental Status    Cheryl Adams is a 30 y.o. female.  HPI Patient is a 30 year old female with a medical history as noted below.  Her mother is at bedside and provides patient's history.  She states that patient had "a traumatic incident" about 1 year ago.  She states that over this time she has progressed and has become nonverbal.  She reports associated anhedonia, decreased appetite, as well as difficulty leaving the home.  She states that the patient now has difficulty with her ADLs and requires assistance with showering as well as feeding herself.  Mother states that last night she began having screaming episodes.  During my initial evaluation patient was screaming and rocking back and forth in the emergency department.  She has since stopped and is now sleeping comfortably in bed.  She follows commands and nods her head "yes" and "no" when I ask her questions but otherwise is nonverbal.  Patient was evaluated by behavioral health on Apr 02, 2021.  Patient was apparently bullied by her coworkers due to having an emotional relationship with her boss at work.  After this, patient began exhibiting her current behavior.   Past Psychiatric History:  Risk to Self:  yes Risk to Others:  no Prior Inpatient Therapy:   Prior Outpatient Therapy:    Past Medical History:  Past Medical History:  Diagnosis Date  . Anemia   . Chronic tonsillitis 10/2014  . Cough 11/09/2014  . Difficulty swallowing pills  Past Surgical History:  Procedure Laterality Date  . TONSILLECTOMY AND ADENOIDECTOMY Bilateral 11/14/2014   Procedure: BILATERAL TONSILLECTOMY AND ADENOIDECTOMY;  Surgeon: Flo Shanks, MD;  Location: [facility];  Service: ENT;  Laterality: Bilateral;  . TYMPANOSTOMY TUBE PLACEMENT     Family History: No family history on file. Family Psychiatric  History: unknown Social History:  Social History    Substance and Sexual Activity  Alcohol Use No     Social History   Substance and Sexual Activity  Drug Use No    Social History   Socioeconomic History  . Marital status: Single    Spouse name: Not on file  . Number of children: Not on file  . Years of education: Not on file  . Highest education level: Not on file  Occupational History  . Not on file  Tobacco Use  . Smoking status: Never Smoker  . Smokeless tobacco: Never Used  Substance and Sexual Activity  . Alcohol use: No  . Drug use: No  . Sexual activity: Not on file  Other Topics Concern  . Not on file  Social History Narrative  . Not on file   Social Determinants of Health   Financial Resource Strain: Not on file  Food Insecurity: Not on file  Transportation Needs: Not on file  Physical Activity: Not on file  Stress: Not on file  Social Connections: Not on file   Additional Social History:    Allergies:  No Known Allergies  Labs:  Results for orders placed or performed during the hospital encounter of 04/24/21 (from the past 48 hour(s))  I-Stat beta hCG blood, ED     Status: None   Collection Time: 04/27/21  9:25 AM  Result Value Ref Range   I-stat hCG, quantitative <5.0 <5 mIU/mL   Comment 3            Comment:   GEST. AGE      CONC.  (mIU/mL)   <=1 WEEK        5 - 50     2 WEEKS       50 - 500     3 WEEKS       100 - 10,000     4 WEEKS     1,000 - 30,000        FEMALE AND NON-PREGNANT FEMALE:     LESS THAN 5 mIU/mL     Medications:  Current Facility-Administered Medications  Medication Dose Route Frequency Provider Last Rate Last Admin  . OLANZapine zydis (ZYPREXA) disintegrating tablet 5 mg  5 mg Oral Daily Ophelia Shoulder E, NP   5 mg at 04/27/21 1043  . temazepam (RESTORIL) capsule 15 mg  15 mg Oral QHS Ophelia Shoulder E, NP   15 mg at 04/26/21 2133   Current Outpatient Medications  Medication Sig Dispense Refill  . acetaminophen (TYLENOL) 500 MG tablet Take 500 mg by mouth every 6  (six) hours as needed for moderate pain or headache.    . ferrous sulfate 325 (65 FE) MG tablet Take 325 mg by mouth daily.    Marland Kitchen amoxicillin-clavulanate (AUGMENTIN) 875-125 MG tablet Take 1 tablet by mouth every 12 (twelve) hours. (Patient not taking: Reported on 04/24/2021) 14 tablet 0  . ferrous sulfate 325 (65 FE) MG tablet Take 1 tablet (325 mg total) by mouth 3 (three) times daily with meals. (Patient not taking: Reported on 04/24/2021) 90 tablet 0    Musculoskeletal: Strength & Muscle Tone: within normal  limits Gait & Station: normal Patient leans: unable to assess  Psychiatric Specialty Exam: Physical Exam HENT:     Head: Normocephalic.     Nose: Nose normal.  Eyes:     Pupils: Pupils are equal, round, and reactive to light.  Cardiovascular:     Rate and Rhythm: Normal rate.     Pulses: Normal pulses.  Pulmonary:     Effort: Pulmonary effort is normal.  Musculoskeletal:        General: Normal range of motion.     Cervical back: Normal range of motion.  Neurological:     Mental Status: She is alert and oriented to person, place, and time.     Review of Systems  Constitutional: Negative.   HENT: Negative.   Eyes: Negative.   Respiratory: Negative.   Cardiovascular: Negative.   Gastrointestinal: Negative.   Endocrine: Negative.   Musculoskeletal: Negative.   Skin: Negative.   Allergic/Immunologic: Negative.   Neurological: Negative.   Hematological: Negative.   Psychiatric/Behavioral: Positive for hallucinations. Negative for agitation, confusion (improved since starting meds ), self-injury and suicidal ideas.    Blood pressure (!) 137/99, pulse 98, temperature 98.3 F (36.8 C), temperature source Oral, resp. rate 17, height 5\' 4"  (1.626 m), weight 88.5 kg, last menstrual period 04/07/2021, SpO2 100 %.Body mass index is 33.47 kg/m.  General Appearance: Casual and Fairly Groomed  Eye Contact:  Good  Speech:  Clear and Coherent and patient talks really low  Volume:   Decreased  Mood:  Depressed  Affect:  Congruent and Depressed  Thought Process:  Coherent and Descriptions of Associations: Intact  Orientation:  Full (Time, Place, and Person)  Thought Content:  Hallucinations: Visual  Suicidal Thoughts:  No  Homicidal Thoughts:  No  Memory:  Immediate;   Fair Recent;   Fair Remote;   Fair  Judgement:  Impaired but improving  Insight:  Lacking  Psychomotor Activity:  Normal  Concentration:  Concentration: Good and Attention Span: Good  Recall:  Good  Fund of Knowledge:  Good  Language:  Good  Akathisia:  No  Handed:  Right  AIMS (if indicated):     Assets:  Communication Skills Housing Social Support  ADL's:  Intact  Cognition:  Impaired,  Mild  Sleep:   > 6 hours   Treatment Plan Summary:Patient who initially presented with MDD with catatonic features 3 days prior, demonstrates improvement since starting psychotropic medications. She is a lot more clear today and is now communicating with staff, but continues to endorse AVH.  She is not at baseline and her judgement and insight are still lacking.  Continue to recommend inpatient admission.  Daily contact with patient to assess and evaluate symptoms and progress in treatment and Medication management   Continue: Olanzapine 5mg  po daily for mood  Temazepam 15mg  po qhs for sleep  Disposition: Recommend psychiatric Inpatient admission when medically cleared.  This service was provided via telemedicine using a 2-way, interactive audio and video technology.  Names of all persons participating in this telemedicine service and their role in this encounter. Name: Cheryl Adams Role: Patient  Name: Role: PMHNP    , NP 04/27/2021 3:08 PM

## 2021-04-27 NOTE — ED Notes (Signed)
This RN attempted to administer Ativan via IV. Pt pulled out IV before administration could occur. Pt then came into hallway and threw her cup of water at nurses station. Security notified.

## 2021-04-27 NOTE — ED Notes (Signed)
This Clinical research associate was advised by provider that no EMTALA was necessary for this specific tx. Transfer necessity form completed, voluntary transfer form completed and copied and placed in medical records. Face sheet and stickers printed and given to safe [facility]. Eddie from safe transport given 1 bag of belongings that were with patient and paperwork.

## 2021-04-27 NOTE — ED Notes (Signed)
Pt given color book and crayons. Now sitting on her stretcher looking through the book

## 2021-04-28 ENCOUNTER — Encounter (HOSPITAL_COMMUNITY): Payer: Self-pay | Admitting: Adult Health

## 2021-04-28 ENCOUNTER — Other Ambulatory Visit: Payer: Self-pay

## 2021-04-28 ENCOUNTER — Inpatient Hospital Stay (HOSPITAL_COMMUNITY)
Admission: RE | Admit: 2021-04-28 | Discharge: 2021-05-05 | DRG: 885 | Disposition: A | Discharge status: Home / Self Care | Payer: Federal, State, Local not specified - Other | Attending: Psychiatry | Admitting: Psychiatry

## 2021-04-28 DIAGNOSIS — G4700 Insomnia, unspecified: Secondary | ICD-10-CM | POA: Diagnosis present

## 2021-04-28 DIAGNOSIS — F329 Major depressive disorder, single episode, unspecified: Secondary | ICD-10-CM | POA: Diagnosis not present

## 2021-04-28 DIAGNOSIS — K5900 Constipation, unspecified: Secondary | ICD-10-CM | POA: Diagnosis present

## 2021-04-28 DIAGNOSIS — D509 Iron deficiency anemia, unspecified: Secondary | ICD-10-CM | POA: Diagnosis present

## 2021-04-28 DIAGNOSIS — R000 Tachycardia, unspecified: Secondary | ICD-10-CM | POA: Diagnosis present

## 2021-04-28 DIAGNOSIS — Z79899 Other long term (current) drug therapy: Secondary | ICD-10-CM | POA: Diagnosis not present

## 2021-04-28 DIAGNOSIS — F339 Major depressive disorder, recurrent, unspecified: Secondary | ICD-10-CM | POA: Diagnosis present

## 2021-04-28 DIAGNOSIS — Z20822 Contact with and (suspected) exposure to covid-19: Secondary | ICD-10-CM | POA: Diagnosis present

## 2021-04-28 DIAGNOSIS — F061 Catatonic disorder due to known physiological condition: Secondary | ICD-10-CM | POA: Diagnosis present

## 2021-04-28 DIAGNOSIS — F419 Anxiety disorder, unspecified: Secondary | ICD-10-CM | POA: Diagnosis present

## 2021-04-28 MED ORDER — TEMAZEPAM 15 MG PO CAPS
15.0000 mg | ORAL_CAPSULE | Freq: Every day | ORAL | Status: DC
  Administered 2021-04-28: 15 mg via ORAL

## 2021-04-28 MED ORDER — OLANZAPINE 5 MG PO TBDP
5.0000 mg | ORAL_TABLET | Freq: Every day | ORAL | Status: DC
  Filled 2021-04-28: qty 1

## 2021-04-28 MED ORDER — ACETAMINOPHEN 325 MG PO TABS
650.0000 mg | ORAL_TABLET | Freq: Four times a day (QID) | ORAL | Status: DC | PRN
  Administered 2021-04-29 – 2021-04-30 (×2): 650 mg via ORAL
  Filled 2021-04-28 (×2): qty 2

## 2021-04-28 MED ORDER — ZIPRASIDONE MESYLATE 20 MG IM SOLR: 20.0000 mg | INTRAMUSCULAR | Status: DC | PRN

## 2021-04-28 MED ORDER — BENZTROPINE MESYLATE 0.5 MG PO TABS
0.5000 mg | ORAL_TABLET | Freq: Two times a day (BID) | ORAL | Status: DC | PRN
  Administered 2021-04-30: 0.5 mg via ORAL
  Filled 2021-04-28: qty 1
  Filled 2021-04-28: qty 14

## 2021-04-28 MED ORDER — ENSURE ENLIVE PO LIQD
237.0000 mL | Freq: Two times a day (BID) | ORAL | Status: DC
  Administered 2021-04-28 – 2021-05-05 (×11): 237 mL via ORAL
  Filled 2021-04-28 (×18): qty 237

## 2021-04-28 MED ORDER — TEMAZEPAM 15 MG PO CAPS
15.0000 mg | ORAL_CAPSULE | Freq: Every day | ORAL | Status: DC
  Filled 2021-04-28: qty 1

## 2021-04-28 MED ORDER — MAGNESIUM HYDROXIDE 400 MG/5ML PO SUSP: 30.0000 mL | Freq: Every day | ORAL | Status: DC | PRN

## 2021-04-28 MED ORDER — LORAZEPAM 2 MG/ML IJ SOLN: 1.0000 mg | Freq: Four times a day (QID) | INTRAMUSCULAR | Status: DC | PRN

## 2021-04-28 MED ORDER — OLANZAPINE 5 MG PO TBDP
5.0000 mg | ORAL_TABLET | Freq: Three times a day (TID) | ORAL | Status: DC | PRN
  Administered 2021-04-30: 5 mg via ORAL
  Filled 2021-04-28: qty 1

## 2021-04-28 MED ORDER — LORAZEPAM 1 MG PO TABS: 1.0000 mg | ORAL_TABLET | ORAL | Status: DC | PRN

## 2021-04-28 MED ORDER — OLANZAPINE 5 MG PO TBDP
5.0000 mg | ORAL_TABLET | Freq: Every day | ORAL | Status: DC
  Administered 2021-04-28 – 2021-04-30 (×3): 5 mg via ORAL
  Filled 2021-04-28 (×6): qty 1

## 2021-04-28 MED ORDER — LORAZEPAM 1 MG PO TABS
1.0000 mg | ORAL_TABLET | Freq: Two times a day (BID) | ORAL | Status: DC
  Administered 2021-04-28 – 2021-04-30 (×4): 1 mg via ORAL
  Filled 2021-04-28 (×4): qty 1

## 2021-04-28 MED ORDER — ALUM & MAG HYDROXIDE-SIMETH 200-200-20 MG/5ML PO SUSP: 30.0000 mL | ORAL | Status: DC | PRN

## 2021-04-28 MED ORDER — TRAZODONE HCL 50 MG PO TABS
50.0000 mg | ORAL_TABLET | Freq: Once | ORAL | Status: AC
  Administered 2021-04-29: 50 mg via ORAL
  Filled 2021-04-28 (×2): qty 1

## 2021-04-28 MED ORDER — FERROUS SULFATE 325 (65 FE) MG PO TABS
325.0000 mg | ORAL_TABLET | Freq: Every day | ORAL | Status: DC
  Administered 2021-04-29 – 2021-05-05 (×7): 325 mg via ORAL
  Filled 2021-04-28 (×3): qty 1
  Filled 2021-04-28: qty 7
  Filled 2021-04-28 (×4): qty 1
  Filled 2021-04-28: qty 7
  Filled 2021-04-28 (×2): qty 1

## 2021-04-28 MED ORDER — OLANZAPINE 10 MG PO TBDP
10.0000 mg | ORAL_TABLET | Freq: Every day | ORAL | Status: DC
Start: 2021-04-28 — End: 2021-04-29
  Administered 2021-04-28: 10 mg via ORAL
  Filled 2021-04-28 (×3): qty 1

## 2021-04-28 MED ORDER — DOCUSATE SODIUM 100 MG PO CAPS
100.0000 mg | ORAL_CAPSULE | Freq: Every day | ORAL | Status: DC
  Administered 2021-04-28 – 2021-05-05 (×8): 100 mg via ORAL
  Filled 2021-04-28: qty 1
  Filled 2021-04-28 (×2): qty 7
  Filled 2021-04-28 (×10): qty 1

## 2021-04-28 MED ORDER — ESCITALOPRAM OXALATE 10 MG PO TABS
10.0000 mg | ORAL_TABLET | Freq: Every day | ORAL | Status: DC
  Administered 2021-04-28 – 2021-05-01 (×4): 10 mg via ORAL
  Filled 2021-04-28 (×7): qty 1

## 2021-04-28 MED ORDER — TRAZODONE HCL 100 MG PO TABS: 100.0000 mg | ORAL_TABLET | Freq: Every day | ORAL | Status: DC

## 2021-04-28 MED ORDER — TRAZODONE HCL 50 MG PO TABS
50.0000 mg | ORAL_TABLET | Freq: Every evening | ORAL | Status: DC | PRN
  Administered 2021-04-28 – 2021-04-30 (×2): 50 mg via ORAL
  Filled 2021-04-28 (×3): qty 1

## 2021-04-28 MED ORDER — OLANZAPINE 5 MG PO TBDP
5.0000 mg | ORAL_TABLET | Freq: Every day | ORAL | Status: DC
  Administered 2021-04-28: 5 mg via ORAL
  Filled 2021-04-28 (×4): qty 1

## 2021-04-28 NOTE — Progress Notes (Signed)
   04/28/21 0048  Vital Signs  Temp 98.6 F (37 C)  Temp Source Oral  Pulse Rate 65  Pulse Rate Source Dinamap  Resp 16  BP 120/86  BP Location Left Arm  BP Method Automatic  Patient Position (if appropriate) Standing  Oxygen Therapy  SpO2 100 %  O2 Device Room Air  Height and Weight  Height 5\' 2"  (1.575 m)  Weight 93.9 kg  BSA (Calculated - sq m) 2.03 sq meters  BMI (Calculated) 37.85  Weight in (lb) to have BMI = 25 136.4   D: Patient refused to answer questions and talk in the morning. Pt. Refused eye contact. Later in the morning pt. Agreed to take medicine. Pt. Ate only bites for lunch and went down to the cafeteria and ate only bites of her dinner. Pt. Isolated in room. Pt. Had a very strong body odor smell and was very disheveled wearing 2 hospital gowns. A:  Patient took scheduled medicine.  Support and encouragement provided Routine safety checks conducted every 15 minutes. Patient  Informed to notify staff with any concerns.  R:   Safety maintained.

## 2021-04-28 NOTE — Progress Notes (Signed)
Pt was up complaining about sleeping after receiving Trazodone and Zyprexa per MAR. NP- Cody ordered another 50 Trazodone and given by writer per [facility].

## 2021-04-28 NOTE — Tx Team (Signed)
Interdisciplinary Treatment and Diagnostic Plan Update  04/28/2021 Time of Session: 10:05am Cheryl Adams MRN: [id number]  Principal Diagnosis: MDD (major depressive disorder), single episode with catatonic features  Secondary Diagnoses: Principal Problem:   MDD (major depressive disorder), single episode with catatonic features   Current Medications:  Current Facility-Administered Medications  Medication Dose Route Frequency Provider Last Rate Last Admin  . acetaminophen (TYLENOL) tablet 650 mg  650 mg Oral Q6H PRN Rozetta Nunnery, NP      . alum & mag hydroxide-simeth (MAALOX/MYLANTA) 200-200-20 MG/5ML suspension 30 mL  30 mL Oral Q4H PRN Lindon Romp A, NP      . ferrous sulfate tablet 325 mg  325 mg Oral Daily Lindon Romp A, NP      . magnesium hydroxide (MILK OF MAGNESIA) suspension 30 mL  30 mL Oral Daily PRN Lindon Romp A, NP      . OLANZapine zydis (ZYPREXA) disintegrating tablet 5 mg  5 mg Oral QHS Lindon Romp A, NP   5 mg at 04/28/21 0054  . temazepam (RESTORIL) capsule 15 mg  15 mg Oral QHS Lindon Romp A, NP   15 mg at 04/28/21 0043   PTA Medications: Medications Prior to Admission  Medication Sig Dispense Refill Last Dose  . acetaminophen (TYLENOL) 500 MG tablet Take 500 mg by mouth every 6 (six) hours as needed for moderate pain or headache.     Marland Kitchen amoxicillin-clavulanate (AUGMENTIN) 875-125 MG tablet Take 1 tablet by mouth every 12 (twelve) hours. (Patient not taking: Reported on 04/24/2021) 14 tablet 0   . ferrous sulfate 325 (65 FE) MG tablet Take 1 tablet (325 mg total) by mouth 3 (three) times daily with meals. (Patient not taking: Reported on 04/24/2021) 90 tablet 0   . ferrous sulfate 325 (65 FE) MG tablet Take 325 mg by mouth daily.       Patient Stressors:    Patient Strengths:    Treatment Modalities: Medication Management, Group therapy, Case management,  1 to 1 session with clinician, Psychoeducation, Recreational therapy.   Physician Treatment Plan for  Primary Diagnosis: MDD (major depressive disorder), single episode with catatonic features Long Term Goal(s): Improvement in symptoms so as ready for discharge Improvement in symptoms so as ready for discharge   Short Term Goals: Ability to identify changes in lifestyle to reduce recurrence of condition will improve Ability to verbalize feelings will improve Ability to disclose and discuss suicidal ideas Ability to identify and develop effective coping behaviors will improve Ability to maintain clinical measurements within normal limits will improve Ability to identify triggers associated with substance abuse/mental health issues will improve Ability to identify changes in lifestyle to reduce recurrence of condition will improve Ability to verbalize feelings will improve Ability to disclose and discuss suicidal ideas Ability to demonstrate self-control will improve Ability to identify and develop effective coping behaviors will improve Ability to maintain clinical measurements within normal limits will improve Compliance with prescribed medications will improve Ability to identify triggers associated with substance abuse/mental health issues will improve  Medication Management: Evaluate patient's response, side effects, and tolerance of medication regimen.  Therapeutic Interventions: 1 to 1 sessions, Unit Group sessions and Medication administration.  Evaluation of Outcomes: Not Met  Physician Treatment Plan for Secondary Diagnosis: Principal Problem:   MDD (major depressive disorder), single episode with catatonic features  Long Term Goal(s): Improvement in symptoms so as ready for discharge Improvement in symptoms so as ready for discharge   Short Term Goals: Ability to  identify changes in lifestyle to reduce recurrence of condition will improve Ability to verbalize feelings will improve Ability to disclose and discuss suicidal ideas Ability to identify and develop effective  coping behaviors will improve Ability to maintain clinical measurements within normal limits will improve Ability to identify triggers associated with substance abuse/mental health issues will improve Ability to identify changes in lifestyle to reduce recurrence of condition will improve Ability to verbalize feelings will improve Ability to disclose and discuss suicidal ideas Ability to demonstrate self-control will improve Ability to identify and develop effective coping behaviors will improve Ability to maintain clinical measurements within normal limits will improve Compliance with prescribed medications will improve Ability to identify triggers associated with substance abuse/mental health issues will improve     Medication Management: Evaluate patient's response, side effects, and tolerance of medication regimen.  Therapeutic Interventions: 1 to 1 sessions, Unit Group sessions and Medication administration.  Evaluation of Outcomes: Not Met   RN Treatment Plan for Primary Diagnosis: MDD (major depressive disorder), single episode with catatonic features Long Term Goal(s): Knowledge of disease and therapeutic regimen to maintain health will improve  Short Term Goals: Ability to remain free from injury will improve, Ability to verbalize frustration and anger appropriately will improve, Ability to participate in decision making will improve, Ability to verbalize feelings will improve, Ability to identify and develop effective coping behaviors will improve and Compliance with prescribed medications will improve  Medication Management: RN will administer medications as ordered by provider, will assess and evaluate patient's response and provide education to patient for prescribed medication. RN will report any adverse and/or side effects to prescribing provider.  Therapeutic Interventions: 1 on 1 counseling sessions, Psychoeducation, Medication administration, Evaluate responses to  treatment, Monitor vital signs and CBGs as ordered, Perform/monitor CIWA, COWS, AIMS and Fall Risk screenings as ordered, Perform wound care treatments as ordered.  Evaluation of Outcomes: Not Met   LCSW Treatment Plan for Primary Diagnosis: MDD (major depressive disorder), single episode with catatonic features Long Term Goal(s): Safe transition to appropriate next level of care at discharge, Engage patient in therapeutic group addressing interpersonal concerns.  Short Term Goals: Engage patient in aftercare planning with referrals and resources, Increase social support, Increase ability to appropriately verbalize feelings, Identify triggers associated with mental health/substance abuse issues and Increase skills for wellness and recovery  Therapeutic Interventions: Assess for all discharge needs, 1 to 1 time with Social worker, Explore available resources and support systems, Assess for adequacy in community support network, Educate family and significant other(s) on suicide prevention, Complete Psychosocial Assessment, Interpersonal group therapy.  Evaluation of Outcomes: Not Met   Progress in Treatment: Attending groups: No. Participating in groups: No. Taking medication as prescribed: Yes. Toleration medication: Yes. Family/Significant other contact made: No, will contact:  if consent is provided Patient understands diagnosis: No. Discussing patient identified problems/goals with staff: Yes. Medical problems stabilized or resolved: Yes. Denies suicidal/homicidal ideation: Yes. Issues/concerns per patient self-inventory: No.   New problem(s) identified: No, Describe:  none  New Short Term/Long Term Goal(s): medication stabilization, elimination of SI thoughts, development of comprehensive mental wellness plan.    Patient Goals:  Did not attend  Discharge Plan or Barriers: Patient recently admitted. CSW will continue to follow and assess for appropriate referrals and possible  discharge planning.    Reason for Continuation of Hospitalization: Delusions  Hallucinations Medication stabilization  Estimated Length of Stay: 3-5 days  Attendees: Patient: Did not attend 04/28/2021   Physician:  04/28/2021  Nursing:  04/28/2021   RN Care Manager: 04/28/2021   Social Worker: Darletta Moll, LCSW 04/28/2021   Recreational Therapist:  04/28/2021   Other:  04/28/2021   Other:  04/28/2021  Other: 04/28/2021       Scribe for Treatment Team: Vassie Moselle, LCSW 04/28/2021 11:40 AM

## 2021-04-28 NOTE — H&P (Signed)
Psychiatric Admission Assessment Adult  Patient Identification: Cheryl Adams MRN:  [id number] Date of Evaluation:  04/28/2021 Chief Complaint:  MDD (major depressive disorder), recurrent, with catatonic features (HCC) [F33.9, F06.1] Principal Diagnosis: MDD (major depressive disorder), single episode with catatonic features Diagnosis:  Principal Problem:   MDD (major depressive disorder), single episode with catatonic features  History of Present Illness: Cheryl Adams is a 30 y.o. female who presented to [facility] on 04/24/2021 with her mother. It appears that on arrival to the ED she was non verbal, but responding to commands and nodding yes or no.  At that time, her mother reported that the patient had a traumatic event last year. She reported that the patient has not been eating well, not leaving the home, and required assistance with ADLs. Her mother reported that the patient has been having screaming episodes. Per ED provider notes, the patient was screaming and rocking back and forth.   On evaluation at [facility], the patient is alert. She is oriented to person and place. She is unable to recall the day/date. She does state that she was in the ED for 3-4 days, which is accurate. She states that on the day that she went to the ED, she was sleeping and "woke up and saw something like a figure." She states that it scared her so she woke up her sister who told her to stay calm until their mother arrived. Although patient initially stated that she saw a figure, she then stated "I didn't actually see anything, but it felt like someone was there but no one was." She reports that she has been feeling depressed for approximately one year. She states that she has never received psychiatric care. She reports feeling sad most days of the week. Patient endorses depressive symptoms, including low mood, insomnia, feelings of guilt/worthlessness/hopelessness, problems with energy, problems with concentration, anxiety,  irritability, and decreased appetite. She states that she sleeps approximately 3-4 hours a night and feels tired throughout the day. She states that her appetite has been decreased for the past two months. She states that she thinks she has lost weight, but she does not know how much. Patient denies symptoms of mania or hypomania, including euphoric mood, increased activity, impulsive/reckless behavior, increased talkativeness, and excessive self-confidence that lasted for several consecutive days.  Patient denies auditory hallucinations. On chart review, it is is noted that she reported AH while in the ED but was unable to describe. She reports visual hallucinations of "nature and animals." Patient giggled a couple of times during the assessment as she was looking away from the provider, she was not able to state why she was laughing. Unclear is she was responding to internal stimuli. She denies paranoia. No delusions illicited during this assessment. She denies a trauma history. She denies suicidal ideations. Denies a history of suicide attempts. Denies homicidal ideations. Deneis use of alcohol, marijuana, and other substances. Patient identifies finding a place to live as a stressor. She denies homelessness. States that she lives with her sister, Becky Colan.   Patient was started on olanzapine 5 mg daily and temazepam 15 mg QHS while in the ED. She states that she is tolerating the medications well. Patient received Geodon 20 mg IM on 04/26/2021 for agitation and ativan 2 mg IM on 04/27/21 for agitation.   04/24/2021 Labs: Sodium 134 Potassium 3.1, replaced with 40 mEq oral potassium in the ED  Urinalysis without evidence of UTI  Head CT completed in the ED, which was essentially  normal.   CT HEAD WITHOUT CONTRAST FINDINGS: Brain: No evidence of acute infarction, hemorrhage, hydrocephalus, extra-axial collection or mass lesion/mass effect. Vascular: No hyperdense vessel or unexpected  calcification. Skull: Normal. Negative for fracture or focal lesion. Sinuses/Orbits: No acute finding. Other: None.  IMPRESSION: No acute intracranial findings.  Associated Signs/Symptoms: Depression Symptoms:  depressed mood, insomnia, psychomotor retardation, fatigue, feelings of worthlessness/guilt, difficulty concentrating, hopelessness, anxiety, Duration of Depression Symptoms: Greater than two weeks  (Hypo) Manic Symptoms:  Hallucinations, Irritable Mood, Anxiety Symptoms:  Excessive Worry, Psychotic Symptoms:  Hallucinations: Visual PTSD Symptoms: Denies a history of trauma Total Time spent with patient: 30 minutes  Past Psychiatric History: Denies previous psychiatric history  Is the patient at risk to self? Yes.    Has the patient been a risk to self in the past 6 months? No.  Has the patient been a risk to self within the distant past? No.  Is the patient a risk to others? No.  Has the patient been a risk to others in the past 6 months? No.  Has the patient been a risk to others within the distant past? No.   Prior Inpatient Therapy:   No Prior Outpatient Therapy:  No  Alcohol Screening:   Substance Abuse History in the last 12 months:  No. Consequences of Substance Abuse: NA Previous Psychotropic Medications: No  Psychological Evaluations: No  Past Medical History:  Past Medical History:  Diagnosis Date  . Anemia   . Chronic tonsillitis 10/2014  . Cough 11/09/2014  . Difficulty swallowing pills     Past Surgical History:  Procedure Laterality Date  . TONSILLECTOMY AND ADENOIDECTOMY Bilateral 11/14/2014   Procedure: BILATERAL TONSILLECTOMY AND ADENOIDECTOMY;  Surgeon: Flo ShanksKarol Wolicki, MD;  Location: [facility];  Service: ENT;  Laterality: Bilateral;  . TYMPANOSTOMY TUBE PLACEMENT     Family History: History reviewed. No pertinent family history. Family Psychiatric  History: Tobacco Screening:   Social History:  Social History    Substance and Sexual Activity  Alcohol Use No     Social History   Substance and Sexual Activity  Drug Use No    Additional Social History:                           Allergies:  No Known Allergies Lab Results:  Results for orders placed or performed during the hospital encounter of 04/24/21 (from the past 48 hour(s))  I-Stat beta hCG blood, ED     Status: None   Collection Time: 04/27/21  9:25 AM  Result Value Ref Range   I-stat hCG, quantitative <5.0 <5 mIU/mL   Comment 3            Comment:   GEST. AGE      CONC.  (mIU/mL)   <=1 WEEK        5 - 50     2 WEEKS       50 - 500     3 WEEKS       100 - 10,000     4 WEEKS     1,000 - 30,000        FEMALE AND NON-PREGNANT FEMALE:     LESS THAN 5 mIU/mL   POC SARS Coronavirus 2 Ag-ED - Nasal Swab     Status: None   Collection Time: 04/27/21 10:47 PM  Result Value Ref Range   SARSCOV2ONAVIRUS 2 AG NEGATIVE NEGATIVE    Comment: (NOTE) SARS-CoV-2 antigen  NOT DETECTED.   Negative results are presumptive.  Negative results do not preclude SARS-CoV-2 infection and should not be used as the sole basis for treatment or other patient management decisions, including infection  control decisions, particularly in the presence of clinical signs and  symptoms consistent with COVID-19, or in those who have been in contact with the virus.  Negative results must be combined with clinical observations, patient history, and epidemiological information. The expected result is Negative.  Fact Sheet for Patients: https://www.jennings-kim.com/  Fact Sheet for Healthcare Providers: https://alexander-rogers.biz/  This test is not yet approved or cleared by the [place] FDA and  has been authorized for detection and/or diagnosis of SARS-CoV-2 by FDA under an Emergency Use Authorization (EUA).  This EUA will remain in effect (meaning this test can be used) for the duration of  the COV ID-19 declaration  under Section 564(b)(1) of the Act, 21 U.S.C. section 360bbb-3(b)(1), unless the authorization is terminated or revoked sooner.      Blood Alcohol level:  Lab Results  Component Value Date   ETH <10 04/24/2021    Metabolic Disorder Labs:  Lab Results  Component Value Date   HGBA1C 5.4 12/05/2019   No results found for: PROLACTIN No results found for: CHOL, TRIG, HDL, CHOLHDL, VLDL, LDLCALC  Current Medications: Current Facility-Administered Medications  Medication Dose Route Frequency Provider Last Rate Last Admin  . acetaminophen (TYLENOL) tablet 650 mg  650 mg Oral Q6H PRN Jackelyn Poling, NP      . alum & mag hydroxide-simeth (MAALOX/MYLANTA) 200-200-20 MG/5ML suspension 30 mL  30 mL Oral Q4H PRN Nira Conn A, NP      . ferrous sulfate tablet 325 mg  325 mg Oral Daily Nira Conn A, NP      . magnesium hydroxide (MILK OF MAGNESIA) suspension 30 mL  30 mL Oral Daily PRN Nira Conn A, NP      . OLANZapine zydis (ZYPREXA) disintegrating tablet 5 mg  5 mg Oral QHS Nira Conn A, NP   5 mg at 04/28/21 0054  . temazepam (RESTORIL) capsule 15 mg  15 mg Oral QHS Nira Conn A, NP   15 mg at 04/28/21 0043   PTA Medications: Medications Prior to Admission  Medication Sig Dispense Refill Last Dose  . acetaminophen (TYLENOL) 500 MG tablet Take 500 mg by mouth every 6 (six) hours as needed for moderate pain or headache.     Marland Kitchen amoxicillin-clavulanate (AUGMENTIN) 875-125 MG tablet Take 1 tablet by mouth every 12 (twelve) hours. (Patient not taking: Reported on 04/24/2021) 14 tablet 0   . ferrous sulfate 325 (65 FE) MG tablet Take 1 tablet (325 mg total) by mouth 3 (three) times daily with meals. (Patient not taking: Reported on 04/24/2021) 90 tablet 0   . ferrous sulfate 325 (65 FE) MG tablet Take 325 mg by mouth daily.       Musculoskeletal: Strength & Muscle Tone: within normal limits Gait & Station: normal Patient leans: N/A     Psychiatric Specialty Exam:  Presentation   General Appearance: Appropriate for Environment; Neat  Eye Contact:Fair  Speech:Slow  Speech Volume:Decreased  Handedness:Right   Mood and Affect  Mood:Anxious; Depressed; Hopeless; Worthless  Affect:Congruent; Depressed   Thought Process  Thought Processes:Coherent; Linear  Duration of Psychotic Symptoms: Less than six months  Past Diagnosis of Schizophrenia or Psychoactive disorder: No  Descriptions of Associations:Intact  Orientation:Full (Time, Place and Person)  Thought Content:Logical  Hallucinations:Hallucinations: Visual Description of Visual Hallucinations: "nature  and animals"  Ideas of Reference:None  Suicidal Thoughts:Suicidal Thoughts: No  Homicidal Thoughts:Homicidal Thoughts: No   Sensorium  Memory:Immediate Fair; Recent Fair; Remote Fair  Judgment:Intact  Insight:Present   Executive Functions  Concentration:Fair  Attention Span:Fair  Recall:Fair  Fund of Knowledge:Fair  Language:Fair   Psychomotor Activity  Psychomotor Activity:Psychomotor Activity: Normal   Assets  Assets:Desire for Improvement; Housing; Physical Health; Resilience; Social Support   Sleep  Sleep:Sleep: Poor    Physical Exam: Physical Exam Constitutional:      General: She is not in acute distress.    Appearance: She is not ill-appearing, toxic-appearing or diaphoretic.  HENT:     Head: Normocephalic.     Right Ear: External ear normal.     Left Ear: External ear normal.  Eyes:     Conjunctiva/sclera: Conjunctivae normal.     Pupils: Pupils are equal, round, and reactive to light.  Cardiovascular:     Rate and Rhythm: Normal rate.  Pulmonary:     Effort: Pulmonary effort is normal. No respiratory distress.  Musculoskeletal:        General: Normal range of motion.  Neurological:     General: No focal deficit present.     Mental Status: She is alert.  Psychiatric:        Mood and Affect: Mood is anxious and depressed.        Behavior:  Behavior is cooperative.        Thought Content: Thought content does not include homicidal or suicidal ideation.    Review of Systems  Constitutional: Negative for chills, diaphoresis, fever, malaise/fatigue and weight loss.  HENT: Negative for congestion.   Respiratory: Negative for cough and shortness of breath.   Cardiovascular: Negative for chest pain and palpitations.  Gastrointestinal: Negative for diarrhea, nausea and vomiting.  Neurological: Negative for dizziness and seizures.  Psychiatric/Behavioral: Positive for depression and hallucinations. Negative for memory loss, substance abuse and suicidal ideas. The patient is nervous/anxious and has insomnia.   All other systems reviewed and are negative.  Blood pressure 120/86, pulse 65, temperature 98.6 F (37 C), temperature source Oral, resp. rate 16, height  (1.575 m), weight 93.9 kg, last menstrual period 04/07/2021, SpO2 100 %. Body mass index is 37.86 kg/m.  Treatment Plan Summary: Daily contact with patient to assess and evaluate symptoms and progress in treatment and Medication management  Observation Level/Precautions:  15 minute checks  Laboratory:  BMP, Lipid Panel, TSH, Hgb A1C, Prolactin  Psychotherapy:  Individual, Group  Medications:   Continue olanzapine 5 mg QHS Continue temazepam 15 mg QHS for sleep  See MD's admission SRA, treatment/recommendation & MAR.   Consultations:  As needed  Discharge Concerns:    Estimated LOS: 5-7 days  Other:     Physician Treatment Plan for Primary Diagnosis: MDD (major depressive disorder), single episode with catatonic features Long Term Goal(s): Improvement in symptoms so as ready for discharge  Short Term Goals: Ability to identify changes in lifestyle to reduce recurrence of condition will improve, Ability to verbalize feelings will improve, Ability to disclose and discuss suicidal ideas, Ability to identify and develop effective coping behaviors will improve,  Ability to maintain clinical measurements within normal limits will improve and Ability to identify triggers associated with substance abuse/mental health issues will improve  Physician Treatment Plan for Secondary Diagnosis: Principal Problem:   MDD (major depressive disorder), single episode with catatonic features  Long Term Goal(s): Improvement in symptoms so as ready for discharge  Short Term Goals:  Ability to identify changes in lifestyle to reduce recurrence of condition will improve, Ability to verbalize feelings will improve, Ability to disclose and discuss suicidal ideas, Ability to demonstrate self-control will improve, Ability to identify and develop effective coping behaviors will improve, Ability to maintain clinical measurements within normal limits will improve, Compliance with prescribed medications will improve and Ability to identify triggers associated with substance abuse/mental health issues will improve  I certify that inpatient services furnished can reasonably be expected to improve the patient's condition.    Jackelyn Poling, NP 5/30/20221:10 AM

## 2021-04-28 NOTE — Progress Notes (Signed)
Pt did not attend group. 

## 2021-04-28 NOTE — Progress Notes (Signed)
Recreation Therapy Notes  Date: 5.30.22 Time: 1000 Location: [address] Dayroom  Group Topic: Wellness  Goal Area(s) Addresses:  Patient will define components of whole wellness. Patient will verbalize benefit of whole wellness.  Intervention: Exercise, Music  Activity: Exercise.  LRT lead group in a series of stretches to get them loosened up for the rest of the activity.  Patients would then take turns leading the group in exercises of their choosing.  Patients were encouraged to do the best they could for at least 30 minutes.  Patients were also encouraged to take breaks or get water if needed.  Education: Wellness, Building control surveyor.   Education Outcome: Acknowledges education/In group clarification offered/Needs additional education.   Clinical Observations/Feedback: Pt did not attend group session.    Caroll Rancher, LRT/CTRS         Caroll Rancher A 04/28/2021 12:35 PM

## 2021-04-28 NOTE — BHH Counselor (Signed)
Adult Comprehensive Assessment  Patient ID: Cheryl Adams, female   DOB: 08/21/1991, 30 y.o.   MRN: [id number]  Information Source: Information source: Patient  Current Stressors:  Patient states their primary concerns and needs for treatment are:: "Depressed" Patient states their goals for this hospitilization and ongoing recovery are:: "Some arts and crafts" Educational / Learning stressors: Denies stressor Employment / Job issues: Denies stressor. Currently unemployed Family Relationships: Denies Metallurgist / Lack of resources (include bankruptcy): Yes, lack of income Housing / Lack of housing: Yes, wants to move out of sisters house Physical health (include injuries & life threatening diseases): Denies stressor Social relationships: Denies stressor Substance abuse: Denies stressor Bereavement / Loss: "Not being myself"  Living/Environment/Situation:  Living Arrangements: Other relatives Living conditions (as described by patient or guardian): Lives with sister Who else lives in the home?: Sister How long has patient lived in current situation?: 14 years What is atmosphere in current home: Temporary  Family History:  Marital status: Single Are you sexually active?: No What is your sexual orientation?: Heterosexual Has your sexual activity been affected by drugs, alcohol, medication, or emotional stress?: Denies Does patient have children?: No  Childhood History:  By whom was/is the patient raised?: Mother Additional childhood history information: Client reported she was raised by her mother. Client reported she childhood was "normal". Client reported her dad was somewhat in her life. Client reported no relationship with him now. Good relationship with her mother. Description of patient's relationship with caregiver when they were a child: "pretty good" Patient's description of current relationship with people who raised him/her: "Little better" How were you disciplined  when you got in trouble as a child/adolescent?: whoopings Does patient have siblings?: Yes Number of Siblings: 1 Description of patient's current relationship with siblings: Decent Did patient suffer any verbal/emotional/physical/sexual abuse as a child?: No Did patient suffer from severe childhood neglect?: No Has patient ever been sexually abused/assaulted/raped as an adolescent or adult?: No Was the patient ever a victim of a crime or a disaster?: No Witnessed domestic violence?: No Has patient been affected by domestic violence as an adult?: No  Education:  Highest grade of school patient has completed: Some college through [employer] Currently a Consulting civil engineer?: No Learning disability?: Yes What learning problems does patient have?: Had IEP for extra time  Employment/Work Situation:   Employment situation: Unemployed Patient's job has been impacted by current illness: Yes Describe how patient's job has been impacted: Client's mother reported at the client's previous job she had an emotional relationship with her boss. Mother reported when the client's coworkers found out she was taunted and bullied. Mother reported that job was a year ago and that was the start of the client's withdrawn behaviors. What is the longest time patient has a held a job?: 2 years Where was the patient employed at that time?: [employer] Has patient ever been in the [employer]?: No  Financial Resources:   Surveyor, quantity resources: Support from parents / caregiver Does patient have a Lawyer or guardian?: No  Alcohol/Substance Abuse:   What has been your use of drugs/alcohol within the last 12 months?: Denies all use If attempted suicide, did drugs/alcohol play a role in this?: No Alcohol/Substance Abuse Treatment Hx: Denies past history Has alcohol/substance abuse ever caused legal problems?: No  Social Support System:   Conservation officer, nature Support System: Fair Development worker, community Support System: Mom Type of  faith/religion: None How does patient's faith help to cope with current illness?: n/a  Leisure/Recreation:  Do You Have Hobbies?: Yes Leisure and Hobbies: Paint, music  Strengths/Needs:   What is the patient's perception of their strengths?: "Helping people" Patient states they can use these personal strengths during their treatment to contribute to their recovery: uta Patient states these barriers may affect/interfere with their treatment: none Patient states these barriers may affect their return to the community: none Other important information patient would like considered in planning for their treatment: none  Discharge Plan:   Currently receiving community mental health services: No Patient states concerns and preferences for aftercare planning are: Pt is interested in being set up with therapy and medication management Patient states they will know when they are safe and ready for discharge when: Yes, however states not ready yet Does patient have access to transportation?: Yes Does patient have financial barriers related to discharge medications?: Yes Patient description of barriers related to discharge medications: no insurance Will patient be returning to same living situation after discharge?: Yes  Summary/Recommendations:   Summary and Recommendations (to be completed by the evaluator): Cheryl Adams is a 30 year old female who presented to [facility] for catatonia.  Pt reports current stressors are with lack of income and wanting to move out. Pt currently lives with her sister and has been living there the past 14 years. Pt is currently single and identifies as heterosexual. Pt reports that they are not currently sexually active. Pt reports that they have no children. Pt was raised by her mother and reports that the relationship was good as they grew up. Pt reports that their current relationship with their caretakers is good. Pt reports they were not abused as a child.  [name] highest  level of education is some college. Pt is currently unemployed. Pt reports no drug and alcohol use. Pt describes their support system as fair and states mother is apart of it. Pt currently sees no outpatient providers. Pt will live at home with sister when they discharge and will be picked up by mother or sister.  While here, Cheryl Adams can benefit from crisis stabilization, medication management, therapeutic milieu, and referrals for services.  Cheryl Adams. 04/28/2021

## 2021-04-28 NOTE — BHH Suicide Risk Assessment (Addendum)
[facility] Admission Suicide Risk Assessment   Demographic factors:  Adolescent or young adult Current Mental Status:  Depressed mood with report of VH, thought broadcasting, and ideas of reference and report of recent catatonic behaviors Loss Factors: unemployed per noted Historical Factors: recent traumatic bullying by peers at previous job per notes Risk Reduction Factors:  Positive social support,Living with another person, especially a relative  Total Time Spent in Direct Patient Care:  I personally spent 50 minutes on the unit in direct patient care. The direct patient care time included face-to-face time with the patient, reviewing the patient's chart, communicating with other professionals, and coordinating care. Greater than 50% of this time was spent in counseling or coordinating care with the patient regarding goals of hospitalization, psycho-education, and discharge planning needs.  Principal Problem: MDD (major depressive disorder), single episode with catatonic features Diagnosis:  Principal Problem:   MDD (major depressive disorder), single episode with catatonic features  Subjective Data: The patient is minimally cooperative for questioning making history difficult to obtain. The majority of the history is obtained from review of records. According to notes, the patient was admitted voluntarily from [facility] for worsening depression with psychotic and catatonic features. She was initially brought to the ED on 04/24/21 by her mother due to patient having periods of not speaking, not attending to her ADLs, not feeding herself, not wanting to leave the house, and having screaming episodes. According to her ED assessment, her mother reported that the patient had "traumatic incident" one year ago where she got into an emotional relationship with her boss. She reportedly was taunted and bullied by coworkers when they learned of the relationship, and this was when the patient's withdrawn behaviors  started. She apparently demonstrated periods of catatonic posturing in the ED as well as periods of restlessness, rocking, and screaming in the ED prior to arrival. She was started on Zyprexa $RemoveBe'5mg'zIRrMjJTS$  daily and Restoril $RemoveBefo'15mg'TuJFteGchst$  po qhs in the ED and was noted to start communicating more, eating meals, and attending to some ADLs prior to transfer to [facility]  On exam today the patient denies any past psychiatric diagnoses, previous medication trials, past psychiatric inpatient admissions, or previous suicide attempts. She denies current SI or HI. She denies AH but admits to [facility] of "seeing animals and nature." She admits to belief that the TV is "telling me to do things" and belief that she is getting messages from the TV. She admits to belief in thought broadcasting but denies thought insertion/withdrawal. She admits to some paranoid thinking but will not give details. She reports increasing sense of depressed mood for the last several months with associated anhedonia, poor sleep, low energy, poor focus, and poor appetite. She denies h/o mania or hypomania. She denies drug or alcohol use. She states she is not working, is not married, has no children, lives with her mother, and graduated high school and did some college. She denies PMH. She is reluctant to engage further with questions. See H&P for additional details.  Continued Clinical Symptoms:    The "Alcohol Use Disorders Identification Test", Guidelines for Use in Primary Care, Second Edition.  World Pharmacologist [facility]). Score between 0-7:  no or low risk or alcohol related problems. Score between 8-15:  moderate risk of alcohol related problems. Score between 16-19:  high risk of alcohol related problems. Score 20 or above:  warrants further diagnostic evaluation for alcohol dependence and treatment.  CLINICAL FACTORS:   Depression:   Anhedonia Insomnia Severe Currently Psychotic  Musculoskeletal: Strength & Muscle Tone: within normal limits Gait  & Station: unassessed - sitting on side of bed Patient leans: N/A  Psychiatric Specialty Exam: Physical Exam Vitals reviewed.  HENT:     Head: Normocephalic.  Pulmonary:     Effort: Pulmonary effort is normal.  Neurological:     Mental Status: She is alert.     Review of Systems - would not cooperate for questioning  Blood pressure 120/86, pulse 65, temperature 98.6 F (37 C), temperature source Oral, resp. rate 16, height $RemoveBe'5\' 2"'PXdOpphTJ$  (1.575 m), weight 93.9 kg, last menstrual period 04/07/2021, SpO2 100 %.Body mass index is 37.86 kg/m.  General Appearance: dressed in hospital gown sitting on side of the bed; minimally cooperative for interview  Eye Contact:  Poor  Speech:  Non-spontaneous but answers direct questions in short phrases or with yes/no answers with slow responses  Volume:  Decreased  Mood:  Depressed  Affect:  Flat  Thought Process: concrete  Orientation:  Oriented to self, year, month, and city  Thought Content:  Endorses VH but denies AH; endorses ideas of reference from the TV and belief in thought broadcasting; appears guarded and paranoid  Suicidal Thoughts:  No  Homicidal Thoughts:  No  Memory:  Limited  Judgement:  Impaired  Insight:  Lacking  Psychomotor Activity:  Decreased - able to follow simple commands and no catatonic posturing or purposeless movements noted  Concentration:  Concentration: Fair and Attention Span: Fair  Recall:  [employer] of Knowledge:  Fair  Language:  Fair  Akathisia:  Negative  Assets:  Desire for Improvement Resilience  ADL's:  Independent with prompts  Cognition:  Impaired,  Mild  Sleep:  Number of Hours: 1.75   COGNITIVE FEATURES THAT CONTRIBUTE TO RISK:  Loss of executive function    SUICIDE RISK:   Mild:  In the context of current severity of depression with psychotic/catatonic features  PLAN OF CARE: Patient admitted voluntarily to inpatient psychiatry for what appears to be MDD single episode severe with psychotic  features. She agreed to take medications and I attempted to discuss r/b/se/a medication with her but she could not engage to participate in meaningful discussion.  At this time, we will start scheduled Ativan $RemoveBeforeDE'1mg'inGKjZJxqREryyk$  bid for recent catatonic behaviors and will increase Zyprexa to $RemoveBe'5mg'kZNdWcJAW$  qam and $Remov'10mg'zUALxk$  qhs for psychosis. We will begin Lexapro $RemoveBefor'10mg'PttGVpcLnqQH$  daily for depression. We will change Restoril to Trazodone $RemoveBefo'50mg'gpDcMpcsWUz$  PRN for sleep given start of scheduled benzodiazepine. She will be started on Colace $RemoveB'100mg'BwGqDxdQ$  as stool softener while on iron supplement and will attempt to clarify home iron dose with pharmacy. We will attempt to get collateral information from family with her consent. Admission labs reviewed: coronavirus negative;respiratory panel negative; influenza A and B negative; WBC 8.4, H/H 14/43.7 and platelets 272; beta hCG <5; ETOH <10, UDS negative; Salicylate <7, Tylenol <10; CMP show Na+ 134, K+ 3.1, CO2 21, glucose 177, BUN <5, AST 14 total bili 1.5 otherwise WNL; UA shows 50 glucose, 20 ketones, 100 protein, few bacteria; EKG shows sinus tachycardia 116bpm with QTc 45ms; Head CT noncontrast on 04/24/21 is negative for any acute intracranial abnormality. TSH, lipid, A1c pending. Will order repeat CMP for trending of Na+ and K+ and encourage po intake with Ensure supplements. In context of new onset psychosis symptoms will also check ANA, ESR, RPR, HIV, ceruloplasmin, B12.   I certify that inpatient services furnished can reasonably be expected to improve the patient's condition.   Harlow Asa, MD,  FAPA 04/28/2021, 11:45 AM

## 2021-04-28 NOTE — Progress Notes (Signed)
Pt visible on the unit , pt stated she was feeling better and came out her room some today    04/28/21 2200  Psych Admission Type (Psych Patients Only)  Admission Status Voluntary  Psychosocial Assessment  Patient Complaints None  Eye Contact Poor  Facial Expression Fixed smile  Affect Depressed;Silly  Speech Slow;Soft  Interaction Childlike  Motor Activity Restless;Slow  Appearance/Hygiene Disheveled;In scrubs  Behavior Characteristics Cooperative  Mood Depressed;Preoccupied  Thought Process  Coherency Blocking  Content WDL  Delusions Paranoid  Perception WDL  Hallucination Visual  Judgment Limited  Confusion Mild  Danger to Self  Current suicidal ideation? Denies  Danger to Others  Danger to Others None reported or observed

## 2021-04-28 NOTE — Progress Notes (Signed)
Adult Psychoeducational Group Note  Date:  04/28/2021 Time:  11:00 PM  Group Topic/Focus:  Wrap-Up Group:   The focus of this group is to help patients review their daily goal of treatment and discuss progress on daily workbooks.  Participation Level:  Minimal  Participation Quality:  Appropriate  Affect:  Flat  Cognitive:  Lacking  Insight: Appropriate  Engagement in Group:  Supportive  Modes of Intervention:  Discussion  Additional Comments:  Pt articulated that her goal for today was to focus on her treatment plan. This goal was accomplished. Pt stated she did talk with a staff member from the Adult unit about her care. Pt conveyed that she did not make a phone call and reported relationship with her family and support system was the same. Pt reported she took all medications provided and attended all meal services with 100% intake. Pt said her appetite was the same today. Pt evaluated her sleep last night as fair. Pt verbalized she felt good about herself and rated her overall day a 5 out of 10 on this date. Pt articulated she had no physical pain but rated her pain level a 7 out of 10. Pt denies no auditory or visual hallucinations or thoughts of harming herself or others. Pt said she would alert staff if anything changed. End of Wrap-Up Group progress report.     Nicoletta Dress 04/28/2021, 11:00 PM

## 2021-04-28 NOTE — Progress Notes (Signed)
Admission note:  Pt was voluntarily admitted to [facility]. Pt is alert and oriented x 2. Pt was cooperative during admission. Pt is currently denying SI/HI but endorses pain 3/10 r/t menstrual cramps. Pt refused medications at this time but encouraged to notify nurse if she change her mind. Pt admits seeing images in her mind that are of animals and nature. Pt perspective of hospitalization is that she is her because she had a one- sided "love interest" with a co-worker. She stated her mother said she need to talk to someone since she still likes the person even after not working with hi. 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 [name] makes fair eye contact and appears to be thought blocking and/ or hesitant with providing information. Pt is soft spoken with a low tone making it difficult to understand her at times. Pt states she lives only with her sister and identified sister as main system of support. She identifies as a straight female. She identified loss of employment and concerns about housing as stressors. She denied alcohol and illicit drug use. On observation pt's behavior is congruent with statements of feeling anxious and depressed. Pt cooperative with skin assessment and search process. All belongings were secured and other sent with pt and present at bedside. Pt found to be clear of any abnormal marks. No contraband found, POC and unit policies explained and understanding verbalized. Consents obtained. Food and fluids offered and accepted. Pt had no additional questions or concerns. Will continue to monitor and assess Q15 minute. Safety maintained.

## 2021-04-29 DIAGNOSIS — F061 Catatonic disorder due to known physiological condition: Secondary | ICD-10-CM | POA: Diagnosis not present

## 2021-04-29 DIAGNOSIS — F329 Major depressive disorder, single episode, unspecified: Secondary | ICD-10-CM | POA: Diagnosis not present

## 2021-04-29 LAB — COMPREHENSIVE METABOLIC PANEL
ALT: 17 U/L (ref 0–44)
AST: 21 U/L (ref 15–41)
Albumin: 4 g/dL (ref 3.5–5.0)
Alkaline Phosphatase: 44 U/L (ref 38–126)
Anion gap: 8 (ref 5–15)
BUN: 6 mg/dL (ref 6–20)
CO2: 24 mmol/L (ref 22–32)
Calcium: 9.2 mg/dL (ref 8.9–10.3)
Chloride: 106 mmol/L (ref 98–111)
Creatinine, Ser: 0.76 mg/dL (ref 0.44–1.00)
GFR, Estimated: >60 mL/min (ref >60)
Glucose, Bld: 108 mg/dL — ABNORMAL HIGH (ref 70–99)
Potassium: 3.6 mmol/L (ref 3.5–5.1)
Sodium: 138 mmol/L (ref 135–145)
Total Bilirubin: 0.8 mg/dL (ref 0.3–1.2)
Total Protein: 7.6 g/dL (ref 6.5–8.1)

## 2021-04-29 LAB — LIPID PANEL
Cholesterol: 158 mg/dL (ref 0–200)
HDL: 60 mg/dL (ref >40)
LDL Cholesterol: 92 mg/dL (ref 0–99)
Total CHOL/HDL Ratio: 2.6 RATIO
Triglycerides: 28 mg/dL (ref <150)
VLDL: 6 mg/dL (ref 0–40)

## 2021-04-29 LAB — HIV ANTIBODY (ROUTINE TESTING W REFLEX): HIV Screen 4th Generation wRfx: NONREACTIVE

## 2021-04-29 LAB — RPR: RPR Ser Ql: NONREACTIVE

## 2021-04-29 LAB — SEDIMENTATION RATE: Sed Rate: 6 mm/hr (ref 0–22)

## 2021-04-29 LAB — VITAMIN B12: Vitamin B-12: 373 pg/mL (ref 180–914)

## 2021-04-29 LAB — TSH: TSH: 1.052 u[IU]/mL (ref 0.350–4.500)

## 2021-04-29 MED ORDER — OLANZAPINE 5 MG PO TBDP
15.0000 mg | ORAL_TABLET | Freq: Every day | ORAL | Status: DC
  Administered 2021-04-29: 15 mg via ORAL
  Filled 2021-04-29 (×3): qty 1

## 2021-04-29 NOTE — Plan of Care (Signed)
  Problem: Activity: Goal: Sleeping patterns will improve Outcome: Progressing   Problem: Safety: Goal: Periods of time without injury will increase Outcome: Progressing   

## 2021-04-29 NOTE — BHH Group Notes (Signed)
B[facility] Group Notes:  (Nursing/MHT/Case Management/Adjunct)  Date:  04/29/2021  Time:  10:51 AM  Type of Therapy:  Group Therapy  Participation Level:  Minimal  Participation Quality:  Resistant  Affect:  Flat and Resistant  Cognitive:  Confused  Insight:  Lacking  Engagement in Group:  Resistant  Modes of Intervention:  Orientation  Summary of Progress/Problems: Her goal for today is to be present.   Wynetta Seith J Ahyana Skillin 04/29/2021, 10:51 AM

## 2021-04-29 NOTE — Progress Notes (Signed)
Pt continues to be paranoid on the unit much of the evening. Pt falling asleep in the dayroom. Pt has been visible on the unit much of the evening. Pt given HS medication     04/29/21 2300  Psych Admission Type (Psych Patients Only)  Admission Status Voluntary  Psychosocial Assessment  Patient Complaints Anxiety  Eye Contact Poor  Facial Expression Fixed smile  Affect Depressed;Silly  Speech Slow;Soft  Interaction Childlike  Motor Activity Restless;Slow  Appearance/Hygiene Disheveled;In scrubs  Behavior Characteristics Cooperative  Mood Preoccupied  Thought Process  Coherency Blocking  Content WDL  Delusions Paranoid  Perception WDL  Hallucination Visual  Judgment Limited  Confusion Mild  Danger to Self  Current suicidal ideation? Denies  Danger to Others  Danger to Others None reported or observed

## 2021-04-29 NOTE — Progress Notes (Signed)
Adult Psychoeducational Group Note  Date:  04/29/2021 Time:  9:10 PM  Group Topic/Focus:  Wrap-Up Group:   The focus of this group is to help patients review their daily goal of treatment and discuss progress on daily workbooks.  Participation Level:  None  Participation Quality:  Drowsy  Affect:  Pt fail asleep in group tonight.  Cognitive:  Pt fail asleep in group tonight.  Insight: None  Engagement in Group:  Pt fail asleep in group tonight.  Modes of Intervention:  Pt fail asleep in group tonight.  Additional Comments:  Pt fail asleep in the dayroom while wrap up group was been held.  Felipa Furnace 04/29/2021, 9:10 PM

## 2021-04-29 NOTE — Progress Notes (Signed)
[facility] MD Progress Note  04/29/2021 4:09 PM Cheryl Adams  MRN:  [id number] Subjective: Patient is a 30 year old female with a probable history of a developmental disability who presented to the [facility] emergency department on 04/24/2021 after a reported traumatic incident about a year ago.  Since that time she had progressed and become nonverbal.  She had also developed some screaming episodes.  Objective: Patient is seen and examined.  Patient is a 30 year old female with the above-stated past psychiatric history who was admitted with catatonic symptoms.  She had been started on Zyprexa at the behavioral health urgent care center.  She was transferred to our facility on 5/30.  On that date she was started on Cogentin, Lexapro, lorazepam 1 mg p.o. twice daily and 1 mg every 6 hours as needed catatonia.  Her Zyprexa was increased to 5 mg p.o. daily and 10 mg p.o. nightly as well as the Zyprexa agitation protocol.  Review of the electronic medical record revealed that the patient did not sleep last night.  Today on examination she is verbal.  She is shy and reticent.  She talked about having had auditory and visual hallucinations.  She talked about how she was bullied at the [facility].  She also discussed having worked at [employer] in the past.  She denied suicidal or homicidal ideation.  She appears to have a developmental disability.  She stated she had taken special classes throughout school.  Her vital signs are stable, she is afebrile.  Again, she slept 0 hours last night.  Review of her admission laboratories on the 31st showed normal electrolytes including liver function enzymes.  Lipid panel was normal.  Her vitamin B12 was 373.  Her CBC was essentially normal except for a decreased MCH and MCHC.  Platelets were normal at 272,000.  Differential was normal.  Sedimentation rate was 6.  Acetaminophen was less than 10, salicylate less than 7.  Beta-hCG was negative.  TSH was normal at 1.052.   RPR was nonreactive.  Respiratory panel for influenza A, B and coronavirus were all negative.  HIV was negative.  Urinalysis showed few bacteria, squamous epithelial cells were 11-20, white blood cells were 6-10, but this was a contaminated sample.  Blood alcohol was less than 10.  Drug screen was negative.  CT scan of the head was essentially normal.  EKG showed a sinus tachycardia with a rate of 116.  QTc interval was normal.  Principal Problem: MDD (major depressive disorder), single episode with catatonic features Diagnosis: Principal Problem:   MDD (major depressive disorder), single episode with catatonic features  Total Time spent with patient: 20 minutes  Past Psychiatric History: See admission H&P  Past Medical History:  Past Medical History:  Diagnosis Date  . Anemia   . Chronic tonsillitis 10/2014  . Cough 11/09/2014  . Difficulty swallowing pills     Past Surgical History:  Procedure Laterality Date  . TONSILLECTOMY AND ADENOIDECTOMY Bilateral 11/14/2014   Procedure: BILATERAL TONSILLECTOMY AND ADENOIDECTOMY;  Surgeon: Flo Shanks, MD;  Location: [facility];  Service: ENT;  Laterality: Bilateral;  . TYMPANOSTOMY TUBE PLACEMENT     Family History: History reviewed. No pertinent family history. Family Psychiatric  History: See admission H&P Social History:  Social History   Substance and Sexual Activity  Alcohol Use No     Social History   Substance and Sexual Activity  Drug Use No    Social History   Socioeconomic History  . Marital  status: Single    Spouse name: Not on file  . Number of children: Not on file  . Years of education: Not on file  . Highest education level: Not on file  Occupational History  . Not on file  Tobacco Use  . Smoking status: Never Smoker  . Smokeless tobacco: Never Used  Substance and Sexual Activity  . Alcohol use: No  . Drug use: No  . Sexual activity: Not on file  Other Topics Concern  . Not on file   Social History Narrative  . Not on file   Social Determinants of Health   Financial Resource Strain: Not on file  Food Insecurity: Not on file  Transportation Needs: Not on file  Physical Activity: Not on file  Stress: Not on file  Social Connections: Not on file   Additional Social History:                         Sleep: Poor  Appetite:  Fair  Current Medications: Current Facility-Administered Medications  Medication Dose Route Frequency Provider Last Rate Last Admin  . acetaminophen (TYLENOL) tablet 650 mg  650 mg Oral Q6H PRN Nira Conn A, NP   650 mg at 04/29/21 0738  . alum & mag hydroxide-simeth (MAALOX/MYLANTA) 200-200-20 MG/5ML suspension 30 mL  30 mL Oral Q4H PRN Nira Conn A, NP      . benztropine (COGENTIN) tablet 0.5 mg  0.5 mg Oral BID PRN Mason Jim, Amy E, MD      . docusate sodium (COLACE) capsule 100 mg  100 mg Oral Daily Mason Jim, Amy E, MD   100 mg at 04/29/21 0735  . escitalopram (LEXAPRO) tablet 10 mg  10 mg Oral Daily Comer Locket, MD   10 mg at 04/29/21 0736  . feeding supplement (ENSURE ENLIVE / ENSURE PLUS) liquid 237 mL  237 mL Oral BID BM Mason Jim, Amy E, MD   237 mL at 04/29/21 1446  . ferrous sulfate tablet 325 mg  325 mg Oral Daily Nira Conn A, NP   325 mg at 04/29/21 0735  . LORazepam (ATIVAN) injection 1 mg  1 mg Intramuscular Q6H PRN Mason Jim, Amy E, MD      . LORazepam (ATIVAN) tablet 1 mg  1 mg Oral BID Comer Locket, MD   1 mg at 04/29/21 0738  . OLANZapine zydis (ZYPREXA) disintegrating tablet 5 mg  5 mg Oral Q8H PRN Comer Locket, MD       And  . LORazepam (ATIVAN) tablet 1 mg  1 mg Oral PRN Comer Locket, MD       And  . ziprasidone (GEODON) injection 20 mg  20 mg Intramuscular PRN Mason Jim, Amy E, MD      . magnesium hydroxide (MILK OF MAGNESIA) suspension 30 mL  30 mL Oral Daily PRN Nira Conn A, NP      . OLANZapine zydis (ZYPREXA) disintegrating tablet 15 mg  15 mg Oral QHS Antonieta Pert, MD       . OLANZapine zydis (ZYPREXA) disintegrating tablet 5 mg  5 mg Oral Daily Comer Locket, MD   5 mg at 04/29/21 0736  . traZODone (DESYREL) tablet 50 mg  50 mg Oral QHS PRN Comer Locket, MD   50 mg at 04/28/21 2100    Lab Results:  Results for orders placed or performed during the hospital encounter of 04/28/21 (from the past 48 hour(s))  Comprehensive metabolic panel  Status: Abnormal   Collection Time: 04/29/21  6:37 AM  Result Value Ref Range   Sodium 138 135 - 145 mmol/L   Potassium 3.6 3.5 - 5.1 mmol/L   Chloride 106 98 - 111 mmol/L   CO2 24 22 - 32 mmol/L   Glucose, Bld 108 (H) 70 - 99 mg/dL    Comment: Glucose reference range applies only to samples taken after fasting for at least 8 hours.   BUN 6 6 - 20 mg/dL   Creatinine, Ser 3.53 0.44 - 1.00 mg/dL   Calcium 9.2 8.9 - 61.4 mg/dL   Total Protein 7.6 6.5 - 8.1 g/dL   Albumin 4.0 3.5 - 5.0 g/dL   AST 21 15 - 41 U/L   ALT 17 0 - 44 U/L   Alkaline Phosphatase 44 38 - 126 U/L   Total Bilirubin 0.8 0.3 - 1.2 mg/dL   GFR, Estimated >43 >15 mL/min    Comment: (NOTE) Calculated using the CKD-EPI Creatinine Equation (2021)    Anion gap 8 5 - 15    Comment: Performed at [facility], [address], [place], [place] [address]  Lipid panel     Status: None   Collection Time: 04/29/21  6:37 AM  Result Value Ref Range   Cholesterol 158 0 - 200 mg/dL   Triglycerides 28 <761 mg/dL   HDL 60 >95 mg/dL   Total CHOL/HDL Ratio 2.6 RATIO   VLDL 6 0 - 40 mg/dL   LDL Cholesterol 92 0 - 99 mg/dL    Comment:        Total Cholesterol/HDL:CHD Risk Coronary Heart Disease Risk Table                     Men   Women  1/2 Average Risk   3.4   3.3  Average Risk       5.0   4.4  2 X Average Risk   9.6   7.1  3 X Average Risk  23.4   11.0        Use the calculated Patient Ratio above and the CHD Risk Table to determine the patient's CHD Risk.        ATP III CLASSIFICATION (LDL):  <100     mg/dL   Optimal   093-267  mg/dL   Near or Above                    Optimal  130-159  mg/dL   Borderline  124-580  mg/dL   High  >998     mg/dL   Very High Performed at [facility], [address], [place], [place] [address]   TSH     Status: None   Collection Time: 04/29/21  6:37 AM  Result Value Ref Range   TSH 1.052 0.350 - 4.500 uIU/mL    Comment: Performed by a 3rd Generation assay with a functional sensitivity of <=0.01 uIU/mL. Performed at [facility], [address], [place], [place] [address]   Sedimentation rate     Status: None   Collection Time: 04/29/21  6:37 AM  Result Value Ref Range   Sed Rate 6 0 - 22 mm/hr    Comment: Performed at [facility], [address] [place], [place] [address]  HIV Antibody (routine testing w rflx)     Status: None   Collection Time: 04/29/21  6:37 AM  Result Value Ref Range  HIV Screen 4th Generation wRfx Non Reactive Non Reactive    Comment: Performed at [facility], [address] [place], [place] [address]  RPR     Status: None   Collection Time: 04/29/21  6:37 AM  Result Value Ref Range   RPR Ser Ql NON REACTIVE NON REACTIVE    Comment: Performed at [facility], [address] [place], [place] [address]  Vitamin B12     Status: None   Collection Time: 04/29/21  6:37 AM  Result Value Ref Range   Vitamin B-12 373 180 - 914 pg/mL    Comment: (NOTE) This assay is not validated for testing neonatal or myeloproliferative syndrome specimens for Vitamin B12 levels. Performed at [facility], [address] [place], [place] [address]     Blood Alcohol level:  Lab Results  Component Value Date   ETH <10 04/24/2021    Metabolic Disorder Labs: Lab Results  Component Value Date   HGBA1C 5.4 12/05/2019   No results found for: PROLACTIN Lab Results  Component Value Date   CHOL 158 04/29/2021   TRIG 28 04/29/2021   HDL 60 04/29/2021   CHOLHDL 2.6  04/29/2021   VLDL 6 04/29/2021   LDLCALC 92 04/29/2021    Physical Findings: AIMS:  , ,  ,  ,    CIWA:    COWS:     Musculoskeletal: Strength & Muscle Tone: within normal limits Gait & Station: normal Patient leans: N/A  Psychiatric Specialty Exam:  Presentation  General Appearance: Fairly Groomed  Eye Contact:Fair  Speech:Normal Rate  Speech Volume:Decreased  Handedness:Right   Mood and Affect  Mood:Anxious  Affect:Congruent   Thought Process  Thought Processes:Goal Directed  Descriptions of Associations:Circumstantial  Orientation:Full (Time, Place and Person)  Thought Content:Illusions  History of Schizophrenia/Schizoaffective disorder:No  Duration of Psychotic Symptoms:Less than six months  Hallucinations:Hallucinations: Auditory; Visual Description of Visual Hallucinations: "nature and animals"  Ideas of Reference:Delusions  Suicidal Thoughts:Suicidal Thoughts: No  Homicidal Thoughts:Homicidal Thoughts: No   Sensorium  Memory:Immediate Fair; Recent Fair; Remote Fair  Judgment:Fair  Insight:Fair   Executive Functions  Concentration:Fair  Attention Span:Fair  Recall:Fair  Fund of Knowledge:Fair  Language:Fair   Psychomotor Activity  Psychomotor Activity:Psychomotor Activity: Decreased   Assets  Assets:Desire for Improvement; Social Support; Resilience; Housing   Sleep  Sleep:Sleep: Poor Number of Hours of Sleep: 0    Physical Exam: Physical Exam Vitals and nursing note reviewed.  Constitutional:      Appearance: Normal appearance.  HENT:     Head: Normocephalic and atraumatic.  Pulmonary:     Effort: Pulmonary effort is normal.  Neurological:     General: No focal deficit present.     Mental Status: She is alert and oriented to person, place, and time.    Review of Systems  All other systems reviewed and are negative.  Blood pressure 114/79, pulse 76, temperature 97.9 F (36.6 C), temperature source Oral,  resp. rate 16, height 5\' 2"  (1.575 m), weight 93.9 kg, last menstrual period 04/07/2021, SpO2 100 %. Body mass index is 37.86 kg/m.   Treatment Plan Summary: Daily contact with patient to assess and evaluate symptoms and progress in treatment, Medication management and Plan : Patient is seen and examined.  Patient is a 30 year old female with the above-stated past psychiatric history who is seen in follow-up.   Diagnosis: 1.  Unspecified mood disorder. 2.  Concern for underlying developmental disability. 3.  Unspecified catatonia. 4.  Major depression single episode with catatonic features 5.  Delusionary versus hallucinatory symptoms  Pertinent findings on examination today: 1.  Averbal and catatonic symptoms are better. 2.  She continues with poor sleep. 3.  Concern for contribution of developmental disability and the entire presentation. 4.  Patient is improved, but given the fact she is on an antipsychotic, an antidepressant, benzodiazepine as well its difficult to decide what has improved her situation.  Plan: 1.  Continue Cogentin 0.5 mg p.o. twice daily as needed tremor. 2.  Continue Colace 100 mg p.o. daily for constipation. 3.  Continue Lexapro 10 mg p.o. daily for anxiety and depression. 4.  Continue ferrous sulfate 325 mg p.o. daily for anemia. 5.  Continue lorazepam 1 mg p.o. twice daily for anxiety and catatonia. 6.  Continue lorazepam 1 mg IM every 6 hours as needed catatonia. 7.  Increase Zyprexa to 5 mg p.o. daily and 15 mg p.o. nightly for mood stability, sleep and psychosis. 8.  Continue trazodone 50 mg p.o. nightly as needed insomnia. 9.  Continue Zyprexa Zydis agitation protocol as needed. 10.  Urine cultures been ordered 11.  Collateral information from mother regarding disability will be critical. 12.  Disposition planning-in progress.  Antonieta Pert, MD 04/29/2021, 4:09 PM

## 2021-04-29 NOTE — Progress Notes (Signed)
   04/29/21 0500  Sleep  Number of Hours 0

## 2021-04-29 NOTE — Progress Notes (Signed)
Recreation Therapy Notes  INPATIENT RECREATION THERAPY ASSESSMENT  Patient Details Name: Cheryl Adams MRN: [id number] DOB: 1991-03-23 Today's Date: 04/29/2021       Information Obtained From: Patient  Able to Participate in Assessment/Interview: Yes  Patient Presentation: Alert (Flat)  Reason for Admission (Per Patient): Other (Comments) (Depression)  Patient Stressors: Work  Coping Skills:   Mining engineer  Leisure Interests (2+):  Individual - TV  Frequency of Recreation/Participation: Other (Comment) (Daily)  Awareness of Community Resources:  Yes  Community Resources:  Careers information officer (Comment) (Trampoline Park)  Current Use: Yes (Pt stated she has never been to the trampoline park but goes to the mall and resturants.)  If no, Barriers?:    Expressed Interest in [employer] Information: No  [place] of Residence:  [place]  Patient Main Form of Transportation: Car  Patient Strengths:  Easy going, Easy to talk to, Resilient  Patient Identified Areas of Improvement:  None  Patient Goal for Hospitalization:  "to gain more social skills and learn hands on skills"  Current SI (including self-harm):  No  Current HI:  No  Current AVH: No  Staff Intervention Plan: Group Attendance,Collaborate with Interdisciplinary Treatment Team  Consent to Intern Participation: N/A     Caroll Rancher, LRT/CTRS   Caroll Rancher A 04/29/2021, 12:15 PM

## 2021-04-29 NOTE — Progress Notes (Signed)
Recreation Therapy Notes  Date: 5.31.22 Time: 1000 Location: 500 Hall Dayroom  Group Topic: Coping Skills  Goal Area(s) Addresses:  Patient will identify positive coping skills. Patient will identify importance of using proper coping skills.  Behavioral Response: Attentive  Intervention: Worksheet  Activity: Mind Map.  LRT and patients filled out the first 8 boxes together (depression, anxiety, post partum, OCD, Bi-Polar, Split personality, betrayal and PTSD) with situations is which coping skills can be used.  The group would then break out and come with coping skills for each situation identified individually.  The group would then come back together and share the coping skills they came up with.  LRT would write the coping skills on the board with the corresponding situation.  Education: Pharmacologist, Building control surveyor.   Education Outcome: Acknowledges understanding/In group clarification offered/Needs additional education.   Clinical Observations/Feedback: Pt appeared flat.  Pt was observed filling out some of her worksheet.  Pt was quiet throughout group but seemed to be listening to peers as they spoke.  Pt did not share any coping skills with the group.    Caroll Rancher, LRT/CTRS     Caroll Rancher A 04/29/2021 12:02 PM

## 2021-04-29 NOTE — Progress Notes (Signed)
   04/29/21 0617  Vital Signs  Temp 97.9 F (36.6 C)  Temp Source Oral  Pulse Rate 80  Pulse Rate Source Monitor  BP (!) 133/95  BP Location Left Arm  BP Method Automatic  Patient Position (if appropriate) Sitting  Oxygen Therapy  SpO2 100 %   D: Patient denies SI/HI/AVH. Patient complained about pan in her arms. Patient rated anxiety 7/10 and depression 3/10. Pt. Did not sleep last night. Pt. Was in the day room for most of the day with her eyes closed. MHT reported that pt. Is "paranoid" and scared of her room. Pt. Asked MHT to check her room and she was too afraid to be in her room. Pt was impulsive, at  1226 pt. Walked up to the doors and tried to get out of the door. Pt . May have thought that it was time for her to go to lunch Pt. Was verbally redirected with no issues. At dinner time pt. Did the same, trying to walk through the doors and again pt. Was verbally redirected. Pt. Had very limited eye contact and was limited verbally. Pt. Had delayed simple responses.  A:  Patient took scheduled medicine. Pt. Took her meds, chewed her pills and spit some of them out.  Support and encouragement provided Routine safety checks conducted every 15 minutes. Patient  Informed to notify staff with any concerns.   R:  Safety maintained.

## 2021-04-29 NOTE — Progress Notes (Addendum)
Pt continues to not sleep, pt up in her room very paranoid. Pt encouraged to lay down and get some rest.

## 2021-04-30 DIAGNOSIS — F329 Major depressive disorder, single episode, unspecified: Secondary | ICD-10-CM | POA: Diagnosis not present

## 2021-04-30 DIAGNOSIS — F061 Catatonic disorder due to known physiological condition: Secondary | ICD-10-CM | POA: Diagnosis not present

## 2021-04-30 LAB — HEMOGLOBIN A1C
Hgb A1c MFr Bld: 5.8 % — ABNORMAL HIGH (ref 4.8–5.6)
Mean Plasma Glucose: 120 mg/dL

## 2021-04-30 LAB — CERULOPLASMIN: Ceruloplasmin: 25 mg/dL (ref 19.0–39.0)

## 2021-04-30 LAB — ANA: Anti Nuclear Antibody (ANA): NEGATIVE

## 2021-04-30 MED ORDER — RISPERIDONE 1 MG PO TBDP
1.0000 mg | ORAL_TABLET | Freq: Every day | ORAL | Status: DC
  Administered 2021-04-30: 1 mg via ORAL
  Filled 2021-04-30 (×4): qty 1

## 2021-04-30 MED ORDER — RISPERIDONE 1 MG PO TBDP
3.0000 mg | ORAL_TABLET | Freq: Every day | ORAL | Status: DC
  Administered 2021-04-30: 3 mg via ORAL
  Filled 2021-04-30 (×2): qty 3

## 2021-04-30 MED ORDER — LORAZEPAM 1 MG PO TABS
1.0000 mg | ORAL_TABLET | Freq: Three times a day (TID) | ORAL | Status: DC
  Administered 2021-04-30 – 2021-05-01 (×3): 1 mg via ORAL
  Filled 2021-04-30 (×3): qty 1

## 2021-04-30 NOTE — [facility] Group Notes (Signed)
[facility] Group Notes:  (Nursing/MHT/Case Management/Adjunct)  Date:  04/30/2021  Time:  9:56 AM  Type of Therapy:  Group Therapy  Participation Level:  Minimal  Participation Quality:  Resistant  Affect:  Flat and Resistant  Cognitive:  Confused  Insight:  Limited  Engagement in Group:  Lacking and Limited  Modes of Intervention:  Orientation  Summary of Progress/Problems: Her goal today is to get better and to follow directions.   Miana Politte J Leela Vanbrocklin 04/30/2021, 9:56 AM

## 2021-04-30 NOTE — Progress Notes (Signed)
Progress note    04/30/21 0925  Psych Admission Type (Psych Patients Only)  Admission Status Voluntary  Psychosocial Assessment  Patient Complaints Anxiety;Depression  Eye Contact Avertive;Brief;Poor  Facial Expression Pensive;Worried  Affect Anxious;Depressed  Museum/gallery curator;Slow  Interaction Childlike;Forwards little;Guarded  Motor Activity Fidgety;Slow  Appearance/Hygiene Body odor;In hospital gown  Behavior Characteristics Cooperative;Guarded  Mood Anxious;Suspicious;Preoccupied;Pleasant  Thought Process  Coherency Blocking  Content Paranoia  Delusions Paranoid  Perception Derealization  Hallucination None reported or observed  Judgment Limited  Confusion Mild  Danger to Self  Current suicidal ideation? Denies  Danger to Others  Danger to Others None reported or observed

## 2021-04-30 NOTE — [facility] Group Notes (Signed)
Topic: Emotions  Due to the acuity and complex discharge plans, group was not held. Patient was provided therapeutic worksheets and asked to meet with CSW as needed.   Ruthann Cancer MSW, LCSW Clincal Social Worker  [facility]

## 2021-04-30 NOTE — Progress Notes (Signed)
[facility] MD Progress Note  04/30/2021 12:26 PM Cheryl Adams  MRN:  [id number] Subjective:  Patient is a 30 year old female with a probable history of a developmental disability who presented to the [facility] emergency department on 04/24/2021 after a reported traumatic incident about a year ago.  Since that time she had progressed and become nonverbal.  She had also developed some screaming episodes.  Objective: Patient is seen and examined.  Patient is a 30 year old female with the above-stated past psychiatric history who was admitted with catatonic symptoms.  She is not doing as well today as yesterday.  Her speech volume is far decreased.  Her eye contact is worse.  She continues to have problems with sleep and only got 5.5 hours last night.  She is not providing is good a history as she did yesterday.  There is significant concern for an underlying developmental disability, in particular she appears to be somewhat on the autism spectrum.  I was unable to get any real concrete information from her today.  Social work was going to contact her mother to get collateral information with regard to any disabilities.  Her vital signs are stable, she is afebrile.  No new laboratories this AM.  I think it would be worthwhile repeating her urinalysis.  Principal Problem: MDD (major depressive disorder), single episode with catatonic features Diagnosis: Principal Problem:   MDD (major depressive disorder), single episode with catatonic features  Total Time spent with patient: 20 minutes  Past Psychiatric History: See admission H&P  Past Medical History:  Past Medical History:  Diagnosis Date  . Anemia   . Chronic tonsillitis 10/2014  . Cough 11/09/2014  . Difficulty swallowing pills     Past Surgical History:  Procedure Laterality Date  . TONSILLECTOMY AND ADENOIDECTOMY Bilateral 11/14/2014   Procedure: BILATERAL TONSILLECTOMY AND ADENOIDECTOMY;  Surgeon: Flo Shanks, MD;  Location:  [facility];  Service: ENT;  Laterality: Bilateral;  . TYMPANOSTOMY TUBE PLACEMENT     Family History: History reviewed. No pertinent family history. Family Psychiatric  History: See admission H&P Social History:  Social History   Substance and Sexual Activity  Alcohol Use No     Social History   Substance and Sexual Activity  Drug Use No    Social History   Socioeconomic History  . Marital status: Single    Spouse name: Not on file  . Number of children: Not on file  . Years of education: Not on file  . Highest education level: Not on file  Occupational History  . Not on file  Tobacco Use  . Smoking status: Never Smoker  . Smokeless tobacco: Never Used  Substance and Sexual Activity  . Alcohol use: No  . Drug use: No  . Sexual activity: Not on file  Other Topics Concern  . Not on file  Social History Narrative  . Not on file   Social Determinants of Health   Financial Resource Strain: Not on file  Food Insecurity: Not on file  Transportation Needs: Not on file  Physical Activity: Not on file  Stress: Not on file  Social Connections: Not on file   Additional Social History:                         Sleep: Fair  Appetite:  Fair  Current Medications: Current Facility-Administered Medications  Medication Dose Route Frequency Provider Last Rate Last Admin  . acetaminophen (TYLENOL) tablet 650 mg  650 mg Oral Q6H PRN Nira Conn A, NP   650 mg at 04/30/21 0925  . alum & mag hydroxide-simeth (MAALOX/MYLANTA) 200-200-20 MG/5ML suspension 30 mL  30 mL Oral Q4H PRN Nira Conn A, NP      . benztropine (COGENTIN) tablet 0.5 mg  0.5 mg Oral BID PRN Mason Jim, Amy E, MD   0.5 mg at 04/30/21 1030  . docusate sodium (COLACE) capsule 100 mg  100 mg Oral Daily Bartholomew Crews E, MD   100 mg at 04/30/21 0928  . escitalopram (LEXAPRO) tablet 10 mg  10 mg Oral Daily Comer Locket, MD   10 mg at 04/30/21 0925  . feeding supplement (ENSURE ENLIVE /  ENSURE PLUS) liquid 237 mL  237 mL Oral BID BM Mason Jim, Amy E, MD   237 mL at 04/30/21 0927  . ferrous sulfate tablet 325 mg  325 mg Oral Daily Nira Conn A, NP   325 mg at 04/30/21 0925  . LORazepam (ATIVAN) injection 1 mg  1 mg Intramuscular Q6H PRN Mason Jim, Amy E, MD      . LORazepam (ATIVAN) tablet 1 mg  1 mg Oral BID Comer Locket, MD   1 mg at 04/30/21 0925  . OLANZapine zydis (ZYPREXA) disintegrating tablet 5 mg  5 mg Oral Q8H PRN Comer Locket, MD   5 mg at 04/30/21 1029   And  . LORazepam (ATIVAN) tablet 1 mg  1 mg Oral PRN Comer Locket, MD       And  . ziprasidone (GEODON) injection 20 mg  20 mg Intramuscular PRN Mason Jim, Amy E, MD      . magnesium hydroxide (MILK OF MAGNESIA) suspension 30 mL  30 mL Oral Daily PRN Nira Conn A, NP      . OLANZapine zydis (ZYPREXA) disintegrating tablet 15 mg  15 mg Oral QHS Antonieta Pert, MD   15 mg at 04/29/21 2215  . OLANZapine zydis (ZYPREXA) disintegrating tablet 5 mg  5 mg Oral Daily Bartholomew Crews E, MD   5 mg at 04/30/21 0925  . traZODone (DESYREL) tablet 50 mg  50 mg Oral QHS PRN Comer Locket, MD   50 mg at 04/28/21 2100    Lab Results:  Results for orders placed or performed during the hospital encounter of 04/28/21 (from the past 48 hour(s))  Comprehensive metabolic panel     Status: Abnormal   Collection Time: 04/29/21  6:37 AM  Result Value Ref Range   Sodium 138 135 - 145 mmol/L   Potassium 3.6 3.5 - 5.1 mmol/L   Chloride 106 98 - 111 mmol/L   CO2 24 22 - 32 mmol/L   Glucose, Bld 108 (H) 70 - 99 mg/dL    Comment: Glucose reference range applies only to samples taken after fasting for at least 8 hours.   BUN 6 6 - 20 mg/dL   Creatinine, Ser 8.67 0.44 - 1.00 mg/dL   Calcium 9.2 8.9 - 61.9 mg/dL   Total Protein 7.6 6.5 - 8.1 g/dL   Albumin 4.0 3.5 - 5.0 g/dL   AST 21 15 - 41 U/L   ALT 17 0 - 44 U/L   Alkaline Phosphatase 44 38 - 126 U/L   Total Bilirubin 0.8 0.3 - 1.2 mg/dL   GFR, Estimated >50 >93  mL/min    Comment: (NOTE) Calculated using the CKD-EPI Creatinine Equation (2021)    Anion gap 8 5 - 15    Comment: Performed at [facility], [address] [place], [place] [address]  Hemoglobin A1c     Status: Abnormal   Collection Time: 04/29/21  6:37 AM  Result Value Ref Range   Hgb A1c MFr Bld 5.8 (H) 4.8 - 5.6 %    Comment: (NOTE)         Prediabetes: 5.7 - 6.4         Diabetes: >6.4         Glycemic control for adults with diabetes: <7.0    Mean Plasma Glucose 120 mg/dL    Comment: (NOTE) Performed At: [facility] [address] [place], [place] [id number] Jolene Schimke MD AY:[id number]   Lipid panel     Status: None   Collection Time: 04/29/21  6:37 AM  Result Value Ref Range   Cholesterol 158 0 - 200 mg/dL   Triglycerides 28 <355 mg/dL   HDL 60 >73 mg/dL   Total CHOL/HDL Ratio 2.6 RATIO   VLDL 6 0 - 40 mg/dL   LDL Cholesterol 92 0 - 99 mg/dL    Comment:        Total Cholesterol/HDL:CHD Risk Coronary Heart Disease Risk Table                     Men   Women  1/2 Average Risk   3.4   3.3  Average Risk       5.0   4.4  2 X Average Risk   9.6   7.1  3 X Average Risk  23.4   11.0        Use the calculated Patient Ratio above and the CHD Risk Table to determine the patient's CHD Risk.        ATP III CLASSIFICATION (LDL):  <100     mg/dL   Optimal  220-254  mg/dL   Near or Above                    Optimal  130-159  mg/dL   Borderline  270-623  mg/dL   High  >762     mg/dL   Very High Performed at [facility], [address], [place], [place] [address]   TSH     Status: None   Collection Time: 04/29/21  6:37 AM  Result Value Ref Range   TSH 1.052 0.350 - 4.500 uIU/mL    Comment: Performed by a 3rd Generation assay with a functional sensitivity of <=0.01 uIU/mL. Performed at [facility], [address] [place], [place] [address]   Sedimentation rate     Status: None   Collection Time:  04/29/21  6:37 AM  Result Value Ref Range   Sed Rate 6 0 - 22 mm/hr    Comment: Performed at [facility], [address] [place], [place] [address]  HIV Antibody (routine testing w rflx)     Status: None   Collection Time: 04/29/21  6:37 AM  Result Value Ref Range   HIV Screen 4th Generation wRfx Non Reactive Non Reactive    Comment: Performed at [facility], [address] [place], [place] [address]  RPR     Status: None   Collection Time: 04/29/21  6:37 AM  Result Value Ref Range   RPR Ser Ql NON REACTIVE NON REACTIVE    Comment: Performed at [facility], [address], [place], [place] [address]  Vitamin B12     Status: None  Collection Time: 04/29/21  6:37 AM  Result Value Ref Range   Vitamin B-12 373 180 - 914 pg/mL    Comment: (NOTE) This assay is not validated for testing neonatal or myeloproliferative syndrome specimens for Vitamin B12 levels. Performed at [facility], [address], [place], [place] [address]   Ceruloplasmin     Status: None   Collection Time: 04/29/21  6:37 AM  Result Value Ref Range   Ceruloplasmin 25.0 19.0 - 39.0 mg/dL    Comment: (NOTE) Performed At: [facility] [address] [place], [place] [id number] Jolene Schimke MD YP:[id number]     Blood Alcohol level:  Lab Results  Component Value Date   [facility] <10 04/24/2021    Metabolic Disorder Labs: Lab Results  Component Value Date   HGBA1C 5.8 (H) 04/29/2021   MPG 120 04/29/2021   No results found for: PROLACTIN Lab Results  Component Value Date   CHOL 158 04/29/2021   TRIG 28 04/29/2021   HDL 60 04/29/2021   CHOLHDL 2.6 04/29/2021   VLDL 6 04/29/2021   LDLCALC 92 04/29/2021    Physical Findings: AIMS: Facial and Oral Movements Muscles of Facial Expression: None, normal Lips and Perioral Area: None, normal Jaw: None, normal Tongue: None, normal,Extremity Movements Upper (arms, wrists, hands, fingers): None,  normal Lower (legs, knees, ankles, toes): None, normal, Trunk Movements Neck, shoulders, hips: None, normal, Overall Severity Severity of abnormal movements (highest score from questions above): None, normal Incapacitation due to abnormal movements: None, normal Patient's awareness of abnormal movements (rate only patient's report): No Awareness, Dental Status Current problems with teeth and/or dentures?: No Does patient usually wear dentures?: No  CIWA:    COWS:     Musculoskeletal: Strength & Muscle Tone: within normal limits Gait & Station: shuffle Patient leans: N/A  Psychiatric Specialty Exam:  Presentation  General Appearance: Disheveled  Eye Contact:Minimal  Speech:Normal Rate  Speech Volume:Decreased  Handedness:Right   Mood and Affect  Mood:Anxious  Affect:Flat   Thought Process  Thought Processes:Goal Directed  Descriptions of Associations:Circumstantial  Orientation:Partial  Thought Content:Rumination  History of Schizophrenia/Schizoaffective disorder:No  Duration of Psychotic Symptoms:Less than six months  Hallucinations:Hallucinations: Visual  Ideas of Reference:None  Suicidal Thoughts:Suicidal Thoughts: No  Homicidal Thoughts:Homicidal Thoughts: No   Sensorium  Memory:Immediate Poor; Recent Poor; Remote Poor  Judgment:Impaired  Insight:Lacking   Executive Functions  Concentration:Poor  Attention Span:Poor  Recall:Poor  Fund of Knowledge:Poor  Language:Fair   Psychomotor Activity  Psychomotor Activity:Psychomotor Activity: Decreased   Assets  Assets:Desire for Improvement; Resilience; Social Support   Sleep  Sleep:Sleep: Fair Number of Hours of Sleep: 5.5    Physical Exam: Physical Exam Vitals and nursing note reviewed.  HENT:     Head: Normocephalic and atraumatic.  Pulmonary:     Effort: Pulmonary effort is normal.  Neurological:     General: No focal deficit present.     Mental Status: She is alert.     ROS Blood pressure 114/79, pulse 76, temperature 97.9 F (36.6 C), temperature source Oral, resp. rate 16, height 5\' 2"  (1.575 m), weight 93.9 kg, last menstrual period 04/07/2021, SpO2 100 %. Body mass index is 37.86 kg/m.   Treatment Plan Summary: Daily contact with patient to assess and evaluate symptoms and progress in treatment, Medication management and Plan : Patient is seen and examined.  Patient is a 30 year old female with the above-stated past psychiatric history who is seen in follow-up.   Diagnosis: 1.Unspecified mood disorder. 2.  Concern for  underlying developmental disability. 3.  Unspecified catatonia. 4.  Major depression single episode with catatonic features 5.  Delusionary versus hallucinatory symptoms  Pertinent findings on examination today: 1.  Her ability to speak and somewhat catatonic symptoms are worse than they were yesterday. 2.  Sleep has slightly improved but still not great. 3.  Patient does appear to have a developmental disability, and we are waiting on collateral information from the mother. 4.  Increased dose of Zyprexa appears to have had no benefit.  Plan: 1.  Stop Zyprexa 2.  Continue Cogentin 0.5 mg p.o. twice daily as needed tremor. 3.  Continue Colace 100 mg p.o. daily for constipation. 4.  Continue Lexapro 10 mg p.o. daily for anxiety and depression. 5.  Continue ferrous sulfate 325 mg p.o. daily for anemia. 6.  Increase lorazepam to 1 mg p.o. 3 times daily for anxiety and catatonia. 7.  Continue lorazepam 1 mg IM every 6 hours as needed catatonia. 8.  Start Risperdal 1 mg p.o. daily and 2 mg p.o. nightly for mood stability, sleep and psychosis. 9.  Switch to Risperdal based agitation protocol as needed. 10.  Repeat urinalysis 11.  Collateral information with regard to her possible learning disability. 12.  Disposition planning-in progress.  Antonieta PertGreg Lawson Kathye Cipriani, MD 04/30/2021, 12:26 PM

## 2021-04-30 NOTE — Plan of Care (Signed)
  Problem: Education: Goal: Knowledge of [facility] Education information/materials will improve Outcome: Progressing Goal: Emotional status will improve Outcome: Progressing Goal: Mental status will improve Outcome: Progressing Goal: Verbalization of understanding the information provided will improve Outcome: Progressing   

## 2021-04-30 NOTE — Progress Notes (Signed)
Pt tried to elope from unit when this writer was leaving. Pt provided reassurance and was walked back to the unit. Pt placed on UR. Pt provided encouragement and redirection. Pt also provided prn medication. Will continue to monitor.

## 2021-04-30 NOTE — Progress Notes (Signed)
Pt continues to be paranoid, pt visible in the dayroom, pt interacts with peers at times. Pt given PRN Trazodone per MAR with HS medication    04/30/21 2100  Psych Admission Type (Psych Patients Only)  Admission Status Voluntary  Psychosocial Assessment  Patient Complaints Anxiety;Depression  Eye Contact Avertive;Brief;Poor  Facial Expression Pensive;Worried  Affect Anxious;Depressed  Museum/gallery curator;Slow  Interaction Childlike;Forwards little;Guarded  Motor Activity Fidgety;Slow  Appearance/Hygiene Body odor;In hospital gown  Behavior Characteristics Cooperative  Mood Anxious;Suspicious  Thought Process  Coherency Blocking  Content Paranoia  Delusions Paranoid  Perception Derealization  Hallucination None reported or observed  Judgment Limited  Confusion Mild  Danger to Self  Current suicidal ideation? Denies  Danger to Others  Danger to Others None reported or observed

## 2021-04-30 NOTE — Progress Notes (Signed)
   04/30/21 0500  Sleep  Number of Hours 5.5   

## 2021-04-30 NOTE — Progress Notes (Signed)
Patient verbalized that she had an okay day overall. Today is the patient's second day in the hospital.

## 2021-04-30 NOTE — Progress Notes (Signed)
Recreation Therapy Notes  Date: 6.1.22 Time: 1000 Location: 500 Hall Dayroom  Group Topic: Self Esteem, Leisure Education  Goal Area(s) Addresses:  Patient will successfully identify positive characteristics about themselves. Patient will successfully identify things they like to do for fun.   Patient will successfully identify the benefit of positive self esteem.  Intervention: Art - Safety scissors, glue sticks, construction paper, and magazines  Activity: Patient asked to create a personalized collage on their construction paper color of choice. Patients were told to make the collage relative to them and identify things that make them who they or activities they find interesting.  Patients were given multiple craft materials to complete this activity.   Education:Self Esteem, Discharge Planning.   Education Outcome: Acknowledges education.   Clinical Observations/Feedback: Patient did not attend group session.     Caroll Rancher, LRT/ CTRS         Lillia Abed, Aurelia Gras A 04/30/2021 1:19 PM

## 2021-05-01 DIAGNOSIS — F329 Major depressive disorder, single episode, unspecified: Secondary | ICD-10-CM | POA: Diagnosis not present

## 2021-05-01 DIAGNOSIS — F061 Catatonic disorder due to known physiological condition: Secondary | ICD-10-CM | POA: Diagnosis not present

## 2021-05-01 MED ORDER — ESCITALOPRAM OXALATE 20 MG PO TABS
20.0000 mg | ORAL_TABLET | Freq: Every day | ORAL | Status: DC
  Administered 2021-05-02 – 2021-05-05 (×4): 20 mg via ORAL
  Filled 2021-05-01 (×3): qty 1
  Filled 2021-05-01: qty 7
  Filled 2021-05-01: qty 2
  Filled 2021-05-01: qty 7
  Filled 2021-05-01 (×2): qty 1

## 2021-05-01 MED ORDER — RISPERIDONE 2 MG PO TBDP
2.0000 mg | ORAL_TABLET | Freq: Every day | ORAL | Status: DC
  Administered 2021-05-01 – 2021-05-05 (×5): 2 mg via ORAL
  Filled 2021-05-01 (×4): qty 1
  Filled 2021-05-01: qty 21
  Filled 2021-05-01 (×2): qty 1
  Filled 2021-05-01: qty 21

## 2021-05-01 MED ORDER — RISPERIDONE 2 MG PO TBDP
4.0000 mg | ORAL_TABLET | Freq: Every day | ORAL | Status: DC
  Administered 2021-05-01 – 2021-05-04 (×4): 4 mg via ORAL
  Filled 2021-05-01 (×7): qty 2

## 2021-05-01 MED ORDER — ESCITALOPRAM OXALATE 10 MG PO TABS
10.0000 mg | ORAL_TABLET | Freq: Once | ORAL | Status: AC
  Administered 2021-05-01: 10 mg via ORAL
  Filled 2021-05-01 (×2): qty 1

## 2021-05-01 MED ORDER — TRAZODONE HCL 50 MG PO TABS
50.0000 mg | ORAL_TABLET | Freq: Every day | ORAL | Status: DC
  Administered 2021-05-01 – 2021-05-02 (×2): 50 mg via ORAL
  Filled 2021-05-01 (×4): qty 1

## 2021-05-01 MED ORDER — LORAZEPAM 0.5 MG PO TABS
0.5000 mg | ORAL_TABLET | Freq: Three times a day (TID) | ORAL | Status: DC
  Administered 2021-05-01 – 2021-05-05 (×12): 0.5 mg via ORAL
  Filled 2021-05-01 (×12): qty 1

## 2021-05-01 NOTE — Progress Notes (Signed)
Recreation Therapy Notes  Date: 6.2.22 Time: 1000 Location: 500 Hall Dayroom  Group Topic: Decision Making, Problem Solving, Communication  Goal Area(s) Addresses:  Patient will effectively work with peer towards shared goal.  Patient will identify factors that guided their decision making.  Patient will pro-socially communicate ideas during group session.   Behavioral Response: Engaged  Intervention: Survival Scenario - pencil, paper  Activity:  Patients were given a scenario that they were going to be stranded on a deserted [place] for several months before being rescued. Writer tasked them with making a list of 15 things they would choose to bring with them for "survival". The list of items was prioritized most important to least. Each patient would come up with their own list, then work together to create a new list of 15 items while in a group of 3-5 peers. LRT discussed each person's list and how it differed from others. The debrief included discussion of priorities, good decisions versus bad decisions, and how it is important to think before acting so we can make the best decision possible. LRT tied the concept of effective communication among group members to patient's support systems outside of the hospital and its benefit post discharge.  Education: Pharmacist, community, Priorities, Support System, Discharge Planning    Education Outcome: Acknowledges education/In group clarification/Needs additional education  Clinical Observations/Feedback: Pt was quiet but attentive.  Pt would engage when prompted.  Pt came up with a good list on her own.  Pt was also able to help peers come up with a group list.  Some of pts peers encouraged her for some of the things she was able to come up with on her list.  Pt was attentive during discussion.    Caroll Rancher, LRT/CTRS         Caroll Rancher A 05/01/2021 12:55 PM

## 2021-05-01 NOTE — Progress Notes (Signed)
The patient's positive event for the day is that she enjoyed the food. Her goal for tomorrow is to socialize and open up more.

## 2021-05-01 NOTE — Progress Notes (Signed)
Progress note    05/01/21 0818  Psych Admission Type (Psych Patients Only)  Admission Status Voluntary  Psychosocial Assessment  Patient Complaints Anxiety;Depression;Helplessness  Eye Contact Avertive;Brief  Facial Expression Anxious;Pensive;Worried  Affect Anxious;Depressed;Preoccupied  Museum/gallery curator;Slow  Interaction Cautious;Forwards little;Guarded;Minimal  Motor Activity Slow  Appearance/Hygiene In scrubs  Behavior Characteristics Cooperative;Appropriate to situation;Anxious  Mood Anxious;Pleasant  Thought Process  Coherency Blocking  Content Paranoia  Delusions Paranoid  Perception Derealization  Hallucination None reported or observed  Judgment Limited  Confusion Mild  Danger to Self  Current suicidal ideation? Denies  Danger to Others  Danger to Others None reported or observed
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
## 2021-05-01 NOTE — [facility] Group Notes (Signed)
  Occupational Therapy Group Note Date: 05/01/2021 Group Topic/Focus: Self-Esteem  Group Description: Group encouraged increased engagement and participation through discussion and activity focused on self-esteem. Patients explored and discussed the differences between healthy and low self-esteem and how it affects our daily lives and occupations with a focus on relationships, work, school, self-care, and personal leisure interests. Group discussion then transitioned into identifying specific strategies to boost self-esteem and engaged in a collaborative and independent activity looking at positive ways to describe oneself A-Z.   Therapeutic Goal(s): Understand and recognize the differences between healthy and low self-esteem Identify healthy strategies to improve/build self-esteem Participation Level: Moderate   Participation Quality: Moderate Cues   Behavior: Alert and Calm   Speech/Thought Process: Barely audible and Directed   Affect/Mood: Constricted   Insight: Limited   Judgement: Limited   Individualization: Cheryl Adams was moderately engaged in their participation of group discussion/activity with moderate cues needed from clinician. Pt identified "listening to music" as an activity she likes to engage in to improve her self-esteem. Engaged actively in group discussion identifying positive ways to describe self.   Modes of Intervention: Activity, Discussion, Education and Socialization  Patient Response to Interventions:  Attentive   Plan: Continue to engage patient in OT groups 2 - 3x/week.  05/01/2021  Donne Hazel, MOT, OTR/L

## 2021-05-01 NOTE — Progress Notes (Signed)
   05/01/21 0500  Sleep  Number of Hours 3.25

## 2021-05-01 NOTE — Plan of Care (Signed)
  Problem: Coping: Goal: Ability to verbalize frustrations and anger appropriately will improve Outcome: Progressing Goal: Ability to demonstrate self-control will improve Outcome: Progressing   Problem: Health Behavior/Discharge Planning: Goal: Identification of resources available to assist in meeting health care needs will improve Outcome: Progressing   

## 2021-05-01 NOTE — [facility] Group Notes (Signed)
[facility] Group Notes:  (Nursing/MHT/Case Management/Adjunct)  Date:  05/01/2021  Time:  9:52 AM  Type of Therapy:  Group Therapy  Participation Level:  Minimal  Participation Quality:  Resistant  Affect:  Flat  Cognitive:  Disorganized  Insight:  Limited  Engagement in Group:  Limited  Modes of Intervention:  Orientation  Summary of Progress/Problems: Her goal for today is to be active within the unit.   Aireana Ryland J Travious Vanover 05/01/2021, 9:52 AM

## 2021-05-02 LAB — LIPID PANEL
Cholesterol: 168 mg/dL (ref 0–200)
HDL: 66 mg/dL (ref >40)
LDL Cholesterol: 94 mg/dL (ref 0–99)
Total CHOL/HDL Ratio: 2.5 RATIO
Triglycerides: 41 mg/dL (ref <150)
VLDL: 8 mg/dL (ref 0–40)

## 2021-05-02 LAB — URINALYSIS, COMPLETE (UACMP) WITH MICROSCOPIC
Bilirubin Urine: NEGATIVE
Glucose, UA: NEGATIVE mg/dL
Ketones, ur: NEGATIVE mg/dL
Nitrite: NEGATIVE
Protein, ur: NEGATIVE mg/dL
Specific Gravity, Urine: 1.004 — ABNORMAL LOW (ref 1.005–1.030)
pH: 7 (ref 5.0–8.0)

## 2021-05-02 NOTE — [facility] Group Notes (Signed)
SPIRITUALITY GROUP NOTE   Spirituality group facilitated by Kathleen Argue, B[facility].   Group Description: Group focused on topic of hope. Patients participated in facilitated discussion around topic, connecting with one another around experiences and definitions for hope. Group members engaged with visual explorer photos, reflecting on what hope looks like for them today. Group engaged in discussion around how their definitions of hope are present today in hospital.   Modalities: Psycho-social ed, Adlerian, Narrative, MI   Patient Progress: Cheryl Adams was present during group and engaged in conversation when prompted.  She was otherwise quiet, but showed active listening skills and nodded when something that was shared resonated with her.  Chaplain Dyanne Carrel, Bcc Pager, [phone] 2:45 PM

## 2021-05-02 NOTE — Progress Notes (Signed)
Progress note    05/02/21 0744  Psych Admission Type (Psych Patients Only)  Admission Status Voluntary  Psychosocial Assessment  Patient Complaints Anxiety;Depression  Eye Contact Avertive;Brief  Facial Expression Anxious;Pensive  Affect Anxious;Depressed;Preoccupied  Speech Soft;Slow  Interaction Avoidant;Cautious;Forwards little;Guarded;Minimal  Motor Activity Slow  Appearance/Hygiene Poor hygiene;In hospital gown  Behavior Characteristics Cooperative;Appropriate to situation;Anxious;Guarded  Mood Depressed;Anxious;Pleasant  Thought Process  Coherency Blocking  Content Paranoia  Delusions Paranoid  Perception Derealization  Hallucination None reported or observed  Judgment Limited  Confusion Mild  Danger to Self  Current suicidal ideation? Denies  Danger to Others  Danger to Others None reported or observed

## 2021-05-02 NOTE — Plan of Care (Signed)
  Problem: Health Behavior/Discharge Planning: Goal: Compliance with treatment plan for underlying cause of condition will improve Outcome: Progressing   Problem: Physical Regulation: Goal: Ability to maintain clinical measurements within normal limits will improve Outcome: Progressing   Problem: Education: Goal: Knowledge of [facility] Education information/materials will improve Outcome: Progressing

## 2021-05-02 NOTE — [facility] Group Notes (Signed)
CSW Group Therapy Notes    Type of Therapy and Topic: Group Therapy: Effective Communication   Participation Level:  Did not attend   Description of Group:  In this group patients will be asked to identify their own styles of communication as well as defining and identifying passive, assertive, and aggressive styles of communication. Participants will identify strategies to communicate in a more assertive manner in an effort to appropriately meet their needs. This group will be process-oriented, with patients participating in exploration of their own experiences as well as giving and receiving support and challenge from other group members.   Therapeutic Goals: 1. Patient will identify their personal communication style. 2. Patient will identify passive, assertive, and aggressive forms of communication. 3. Patient will identify strategies for developing more effective communication to appropriately meet their needs.    Summary of Patient Progress:  PT did not attend   Therapeutic Modalities:  Communication Skills Solution Focused Therapy Motivational Interviewing   Ruthann Cancer MSW, LCSW Clincal Social Worker  [facility]

## 2021-05-02 NOTE — Progress Notes (Signed)
Urine specimen cup given. Instructions provided. Waiting on sample.

## 2021-05-02 NOTE — Plan of Care (Signed)
  Problem: Education: Goal: Verbalization of understanding the information provided will improve Outcome: Progressing   Problem: Education: Goal: Emotional status will improve Outcome: Not Progressing   Problem: Activity: Goal: Interest or engagement in activities will improve Outcome: Not Progressing

## 2021-05-02 NOTE — Progress Notes (Addendum)
   05/02/21 2252  Psych Admission Type (Psych Patients Only)  Admission Status Voluntary  Psychosocial Assessment  Patient Complaints None  Eye Contact Brief  Facial Expression Other (Comment) (appropriate)  Affect Preoccupied  Speech Soft;Slow  Interaction Forwards little;Minimal;Childlike  Motor Activity Slow  Appearance/Hygiene Poor hygiene;In hospital gown  Behavior Characteristics Cooperative;Anxious  Mood Pleasant;Preoccupied  Thought Process  Coherency Blocking  Content WDL  Delusions None reported or observed  Perception UTA  Hallucination None reported or observed  Judgment Limited  Confusion None  Danger to Self  Current suicidal ideation? Denies  Danger to Others  Danger to Others None reported or observed   Pt seen in the dayroom.Pt denies SI, HI, AVH and pain. Pt has delayed responses and is very childlike. Pt cooperative and takes medications without incident. Denies anxiety or depression.

## 2021-05-02 NOTE — Progress Notes (Signed)
  D:  Patient presents with moderate anxiety and depression rated at a level, (4/10).  Patient observed in dayroom amongst peers with minimal interaction.  Patient denies SI/HI, but agrees to verbal contract with me to seek staff should the thoughts arise.  Patient denies AVH.  A:  Labs/Vitals monitored; Medication education provided; Patient supported emotionally; Patient asked to communicate his needs, concerns, and questions.  R:  Patient remains safe with 15 minute checks; Will continue POC.       05/01/21 2109  Psych Admission Type (Psych Patients Only)  Admission Status Voluntary  Psychosocial Assessment  Patient Complaints Anxiety;Depression  Eye Contact Avertive;Brief  Facial Expression Anxious;Pensive;Worried  Affect Anxious;Depressed;Preoccupied  Museum/gallery curator;Slow  Interaction Cautious;Forwards little;Guarded;Minimal  Motor Activity Slow  Appearance/Hygiene In scrubs  Behavior Characteristics Cooperative;Appropriate to situation;Anxious  Mood Anxious;Pleasant  Thought Process  Coherency Blocking  Content Paranoia  Delusions Paranoid  Perception Derealization  Hallucination None reported or observed  Judgment Limited  Confusion Mild  Danger to Self  Current suicidal ideation? Denies  Danger to Others  Danger to Others None reported or observed

## 2021-05-02 NOTE — Progress Notes (Signed)
Adult Psychoeducational Group Note  Date:  05/02/2021 Time:  10:14 PM  Group Topic/Focus:  Wrap-Up Group:   The focus of this group is to help patients review their daily goal of treatment and discuss progress on daily workbooks.  Participation Level:  Minimal  Participation Quality:  Appropriate  Affect:  Appropriate  Cognitive:  Appropriate  Insight: Appropriate  Engagement in Group:  Supportive  Modes of Intervention:  Discussion  Additional Comments:The patient participated in AA Group on this date with volunteers on how to decrease dependence on relationships while beginning to meet own needs, build confidence, and practice assertiveness--discussed how to demonstrate healthy communication that is honest and self-disclosing. End of Wrap-Up progress report for the AA support group.             Nicoletta Dress 05/02/2021, 10:14 PM

## 2021-05-02 NOTE — Progress Notes (Signed)
[facility] MD Progress Note  05/02/2021 2:35 PM TOSCA PLETZ  MRN:  [id number] Subjective: Patient is a 30 year old female with a probable history of a developmental disability who presented to the [facility] emergency department on 04/24/2021 after a reported traumatic incident about a year ago. Since that time she had progressed and become nonverbal. She had also developed some screaming episodes.   Objective: Patient is seen and examined.  Patient is a 30 year old female with the above-stated past psychiatric history is seen in follow-up.  She continues to actually slowly improved.  Her speech is a little bit of a higher volume today.  It still low but the rate is increased.  She did sleep better last night and got 7 hours of sleep.  Staff and I have discussed with the patient moving to the [address] once a room is available that she can be alone and.  The milieu on the [address] is a bit loud and agitated today, and I think she will do better over there.  She denied any auditory or visual hallucinations.  She denied any suicidal or homicidal ideation.  She stated that her mood was "good".  No new laboratories.  Principal Problem: MDD (major depressive disorder), single episode with catatonic features Diagnosis: Principal Problem:   MDD (major depressive disorder), single episode with catatonic features  Total Time spent with patient: 20 minutes  Past Psychiatric History: See admission H&P  Past Medical History:  Past Medical History:  Diagnosis Date  . Anemia   . Chronic tonsillitis 10/2014  . Cough 11/09/2014  . Difficulty swallowing pills     Past Surgical History:  Procedure Laterality Date  . TONSILLECTOMY AND ADENOIDECTOMY Bilateral 11/14/2014   Procedure: BILATERAL TONSILLECTOMY AND ADENOIDECTOMY;  Surgeon: Flo Shanks, MD;  Location: [facility];  Service: ENT;  Laterality: Bilateral;  . TYMPANOSTOMY TUBE PLACEMENT     Family History: History reviewed.  No pertinent family history. Family Psychiatric  History: See admission H&P Social History:  Social History   Substance and Sexual Activity  Alcohol Use No     Social History   Substance and Sexual Activity  Drug Use No    Social History   Socioeconomic History  . Marital status: Single    Spouse name: Not on file  . Number of children: Not on file  . Years of education: Not on file  . Highest education level: Not on file  Occupational History  . Not on file  Tobacco Use  . Smoking status: Never Smoker  . Smokeless tobacco: Never Used  Substance and Sexual Activity  . Alcohol use: No  . Drug use: No  . Sexual activity: Not on file  Other Topics Concern  . Not on file  Social History Narrative  . Not on file   Social Determinants of Health   Financial Resource Strain: Not on file  Food Insecurity: Not on file  Transportation Needs: Not on file  Physical Activity: Not on file  Stress: Not on file  Social Connections: Not on file   Additional Social History:                         Sleep: Good  Appetite:  Good  Current Medications: Current Facility-Administered Medications  Medication Dose Route Frequency Provider Last Rate Last Admin  . acetaminophen (TYLENOL) tablet 650 mg  650 mg Oral Q6H PRN Nira Conn A, NP   650 mg at  04/30/21 0925  . alum & mag hydroxide-simeth (MAALOX/MYLANTA) 200-200-20 MG/5ML suspension 30 mL  30 mL Oral Q4H PRN Nira Conn A, NP      . benztropine (COGENTIN) tablet 0.5 mg  0.5 mg Oral BID PRN Mason Jim, Amy E, MD   0.5 mg at 04/30/21 1030  . docusate sodium (COLACE) capsule 100 mg  100 mg Oral Daily Mason Jim, Amy E, MD   100 mg at 05/02/21 0744  . escitalopram (LEXAPRO) tablet 20 mg  20 mg Oral Daily Antonieta Pert, MD   20 mg at 05/02/21 0743  . feeding supplement (ENSURE ENLIVE / ENSURE PLUS) liquid 237 mL  237 mL Oral BID BM Mason Jim, Amy E, MD   237 mL at 05/02/21 1030  . ferrous sulfate tablet 325 mg  325 mg  Oral Daily Nira Conn A, NP   325 mg at 05/02/21 0744  . LORazepam (ATIVAN) injection 1 mg  1 mg Intramuscular Q6H PRN Mason Jim, Amy E, MD      . LORazepam (ATIVAN) tablet 0.5 mg  0.5 mg Oral TID Antonieta Pert, MD   0.5 mg at 05/02/21 1110  . magnesium hydroxide (MILK OF MAGNESIA) suspension 30 mL  30 mL Oral Daily PRN Nira Conn A, NP      . risperiDONE (RISPERDAL M-TABS) disintegrating tablet 2 mg  2 mg Oral Daily Antonieta Pert, MD   2 mg at 05/02/21 0744  . risperiDONE (RISPERDAL M-TABS) disintegrating tablet 4 mg  4 mg Oral QHS Antonieta Pert, MD   4 mg at 05/01/21 2109  . traZODone (DESYREL) tablet 50 mg  50 mg Oral QHS Antonieta Pert, MD   50 mg at 05/01/21 2109    Lab Results: No results found for this or any previous visit (from the past 48 hour(s)).  Blood Alcohol level:  Lab Results  Component Value Date   ETH <10 04/24/2021    Metabolic Disorder Labs: Lab Results  Component Value Date   HGBA1C 5.8 (H) 04/29/2021   MPG 120 04/29/2021   No results found for: PROLACTIN Lab Results  Component Value Date   CHOL 158 04/29/2021   TRIG 28 04/29/2021   HDL 60 04/29/2021   CHOLHDL 2.6 04/29/2021   VLDL 6 04/29/2021   LDLCALC 92 04/29/2021    Physical Findings: AIMS: Facial and Oral Movements Muscles of Facial Expression: None, normal Lips and Perioral Area: None, normal Jaw: None, normal Tongue: None, normal,Extremity Movements Upper (arms, wrists, hands, fingers): None, normal Lower (legs, knees, ankles, toes): None, normal, Trunk Movements Neck, shoulders, hips: None, normal, Overall Severity Severity of abnormal movements (highest score from questions above): None, normal Incapacitation due to abnormal movements: None, normal Patient's awareness of abnormal movements (rate only patient's report): No Awareness, Dental Status Current problems with teeth and/or dentures?: No Does patient usually wear dentures?: No  CIWA:    COWS:      Musculoskeletal: Strength & Muscle Tone: within normal limits Gait & Station: normal Patient leans: N/A  Psychiatric Specialty Exam:  Presentation  General Appearance: Appropriate for Environment  Eye Contact:Fair  Speech:Normal Rate  Speech Volume:Decreased  Handedness:Right   Mood and Affect  Mood:Anxious  Affect:Congruent   Thought Process  Thought Processes:Goal Directed  Descriptions of Associations:Circumstantial  Orientation:Full (Time, Place and Person)  Thought Content:Logical  History of Schizophrenia/Schizoaffective disorder:No  Duration of Psychotic Symptoms:Less than six months  Hallucinations:Hallucinations: None  Ideas of Reference:None  Suicidal Thoughts:Suicidal Thoughts: No  Homicidal Thoughts:Homicidal Thoughts: No  Sensorium  Memory:Immediate Fair; Recent Fair; Remote Fair  Judgment:Fair  Insight:Fair   Executive Functions  Concentration:Fair  Attention Span:Fair  Recall:Fair  Fund of Knowledge:Fair  Language:Fair   Psychomotor Activity  Psychomotor Activity:Psychomotor Activity: Decreased   Assets  Assets:Desire for Improvement; Resilience   Sleep  Sleep:Sleep: Good Number of Hours of Sleep: 7    Physical Exam: Physical Exam Vitals and nursing note reviewed.  Constitutional:      Appearance: Normal appearance.  HENT:     Head: Normocephalic and atraumatic.  Pulmonary:     Effort: Pulmonary effort is normal.  Neurological:     General: No focal deficit present.     Mental Status: She is alert and oriented to person, place, and time.    Review of Systems  All other systems reviewed and are negative.  Blood pressure 121/82, pulse (!) 108, temperature 97.6 F (36.4 C), resp. rate 16, height 5\' 2"  (1.575 m), weight 93.9 kg, last menstrual period 04/07/2021, SpO2 100 %. Body mass index is 37.86 kg/m.   Treatment Plan Summary: Daily contact with patient to assess and evaluate symptoms and  progress in treatment, Medication management and Plan : Patient is seen and examined.  Patient is a 30 year old female with the above-stated past psychiatric history who is seen in follow-up.   Diagnosis: 1  .Unspecified mood disorder. 2. Concern for underlying developmental disability. 3. Unspecified catatonia. 4. Major depression single episode with catatonic features 5. Delusionary versus hallucinatory symptoms 6.  Abnormal urinalysis 7.  Tachycardia  Pertinent findings on examination today: 1.  Patient continues to improve slowly with regard to speech rate, catatonic symptoms, mood and anxiety. 2.  Plan to move her to the [address].  Plan: 1.  Continue Risperdal to 2 mg p.o. daily and 4 mg p.o. nightly for mood stability, sleep and psychosis. 2. Continue Cogentin 0.5 mg p.o. twice daily as needed tremor. 3. Continue Colace 100 mg p.o. daily for constipation. 4.  Continue Lexapro to 20 mg p.o. daily for anxiety and depression. 5. Continue ferrous sulfate 325 mg p.o. daily for anemia. 6.  Continue lorazepam to 0.5 mg p.o. 3 times daily for anxiety and catatonia. 7. Continue lorazepam 1 mg IM every 6 hours as needed catatonia. 8.  Repeat urinalysis 9.  Repeat EKG 10.    Continue trazodone 50 mg p.o. nightly as needed insomnia. 11.  Disposition planning-in progress.  [place], MD 05/02/2021, 2:35 PM

## 2021-05-02 NOTE — Progress Notes (Signed)
Recreation Therapy Notes  Date: 6.3.22 Time: 1000 Location: [address] Day[address]  Group Topic: Communication, Team Building, Problem Solving  Goal Area(s) Addresses:  Patient will effectively work with peer towards shared goal.  Patient will identify skills used to make activity successful.  Patient will identify how skills used during activity can be applied to reach post d/c goals.   Behavioral Response: None  Intervention: STEM Activity- Glass blower/designer  Activity: Tallest [employer]. In teams of 5-6, patients were given 11 craft pipe cleaners. Using the materials provided, patients were instructed to compete again the opposing team(s) to build the tallest free-standing structure from floor level. The activity was timed; difficulty increased by Clinical research associate as Production designer, theatre/television/film continued.  Systematically resources were removed with additional directions for example, placing one arm behind their back, working in silence, and shape stipulations. LRT facilitated post-activity discussion reviewing team processes and necessary communication skills involved in completion. Patients were encouraged to reflect how the skills utilized, or not utilized, in this activity can be incorporated to positively impact support systems post discharge.  Education: Pharmacist, community, Scientist, physiological, Discharge Planning   Education Outcome: Acknowledges education/In group clarification offered/Needs additional education.   Clinical Observations/Feedback: Pt did not participate.  Pt was sitting by the window asleep.    Caroll Rancher, LRT/CTRS         Caroll Rancher A 05/02/2021 12:49 PM

## 2021-05-02 NOTE — Tx Team (Signed)
Interdisciplinary Treatment and Diagnostic Plan Update  05/02/2021 Time of Session: 10:05am Cheryl Adams MRN: [id number]  Principal Diagnosis: MDD (major depressive disorder), single episode with catatonic features  Secondary Diagnoses: Principal Problem:   MDD (major depressive disorder), single episode with catatonic features   Current Medications:  Current Facility-Administered Medications  Medication Dose Route Frequency Provider Last Rate Last Admin  . acetaminophen (TYLENOL) tablet 650 mg  650 mg Oral Q6H PRN Lindon Romp A, NP   650 mg at 04/30/21 0925  . alum & mag hydroxide-simeth (MAALOX/MYLANTA) 200-200-20 MG/5ML suspension 30 mL  30 mL Oral Q4H PRN Lindon Romp A, NP      . benztropine (COGENTIN) tablet 0.5 mg  0.5 mg Oral BID PRN Nelda Marseille, Amy E, MD   0.5 mg at 04/30/21 1030  . docusate sodium (COLACE) capsule 100 mg  100 mg Oral Daily Nelda Marseille, Amy E, MD   100 mg at 05/02/21 0744  . escitalopram (LEXAPRO) tablet 20 mg  20 mg Oral Daily Sharma Covert, MD   20 mg at 05/02/21 0743  . feeding supplement (ENSURE ENLIVE / ENSURE PLUS) liquid 237 mL  237 mL Oral BID BM Nelda Marseille, Amy E, MD   237 mL at 05/02/21 1030  . ferrous sulfate tablet 325 mg  325 mg Oral Daily Lindon Romp A, NP   325 mg at 05/02/21 0744  . LORazepam (ATIVAN) injection 1 mg  1 mg Intramuscular Q6H PRN Nelda Marseille, Amy E, MD      . LORazepam (ATIVAN) tablet 0.5 mg  0.5 mg Oral TID Sharma Covert, MD   0.5 mg at 05/02/21 1110  . magnesium hydroxide (MILK OF MAGNESIA) suspension 30 mL  30 mL Oral Daily PRN Lindon Romp A, NP      . risperiDONE (RISPERDAL M-TABS) disintegrating tablet 2 mg  2 mg Oral Daily Sharma Covert, MD   2 mg at 05/02/21 0744  . risperiDONE (RISPERDAL M-TABS) disintegrating tablet 4 mg  4 mg Oral QHS Sharma Covert, MD   4 mg at 05/01/21 2109  . traZODone (DESYREL) tablet 50 mg  50 mg Oral QHS Sharma Covert, MD   50 mg at 05/01/21 2109   PTA Medications: Medications  Prior to Admission  Medication Sig Dispense Refill Last Dose  . acetaminophen (TYLENOL) 500 MG tablet Take 500 mg by mouth every 6 (six) hours as needed for moderate pain or headache.     Marland Kitchen amoxicillin-clavulanate (AUGMENTIN) 875-125 MG tablet Take 1 tablet by mouth every 12 (twelve) hours. (Patient not taking: Reported on 04/24/2021) 14 tablet 0   . ferrous sulfate 325 (65 FE) MG tablet Take 1 tablet (325 mg total) by mouth 3 (three) times daily with meals. (Patient not taking: Reported on 04/24/2021) 90 tablet 0   . ferrous sulfate 325 (65 FE) MG tablet Take 325 mg by mouth daily.       Patient Stressors:    Patient Strengths:    Treatment Modalities: Medication Management, Group therapy, Case management,  1 to 1 session with clinician, Psychoeducation, Recreational therapy.   Physician Treatment Plan for Primary Diagnosis: MDD (major depressive disorder), single episode with catatonic features Long Term Goal(s): Improvement in symptoms so as ready for discharge Improvement in symptoms so as ready for discharge   Short Term Goals: Ability to identify changes in lifestyle to reduce recurrence of condition will improve Ability to verbalize feelings will improve Ability to disclose and discuss suicidal ideas Ability to identify and develop effective  coping behaviors will improve Ability to maintain clinical measurements within normal limits will improve Ability to identify triggers associated with substance abuse/mental health issues will improve Ability to identify changes in lifestyle to reduce recurrence of condition will improve Ability to verbalize feelings will improve Ability to disclose and discuss suicidal ideas Ability to demonstrate self-control will improve Ability to identify and develop effective coping behaviors will improve Ability to maintain clinical measurements within normal limits will improve Compliance with prescribed medications will improve Ability to identify  triggers associated with substance abuse/mental health issues will improve  Medication Management: Evaluate patient's response, side effects, and tolerance of medication regimen.  Therapeutic Interventions: 1 to 1 sessions, Unit Group sessions and Medication administration.  Evaluation of Outcomes: Not Met  Physician Treatment Plan for Secondary Diagnosis: Principal Problem:   MDD (major depressive disorder), single episode with catatonic features  Long Term Goal(s): Improvement in symptoms so as ready for discharge Improvement in symptoms so as ready for discharge   Short Term Goals: Ability to identify changes in lifestyle to reduce recurrence of condition will improve Ability to verbalize feelings will improve Ability to disclose and discuss suicidal ideas Ability to identify and develop effective coping behaviors will improve Ability to maintain clinical measurements within normal limits will improve Ability to identify triggers associated with substance abuse/mental health issues will improve Ability to identify changes in lifestyle to reduce recurrence of condition will improve Ability to verbalize feelings will improve Ability to disclose and discuss suicidal ideas Ability to demonstrate self-control will improve Ability to identify and develop effective coping behaviors will improve Ability to maintain clinical measurements within normal limits will improve Compliance with prescribed medications will improve Ability to identify triggers associated with substance abuse/mental health issues will improve     Medication Management: Evaluate patient's response, side effects, and tolerance of medication regimen.  Therapeutic Interventions: 1 to 1 sessions, Unit Group sessions and Medication administration.  Evaluation of Outcomes: Not Met   RN Treatment Plan for Primary Diagnosis: MDD (major depressive disorder), single episode with catatonic features Long Term Goal(s):  Knowledge of disease and therapeutic regimen to maintain health will improve  Short Term Goals: Ability to remain free from injury will improve, Ability to verbalize frustration and anger appropriately will improve, Ability to participate in decision making will improve, Ability to verbalize feelings will improve, Ability to identify and develop effective coping behaviors will improve and Compliance with prescribed medications will improve  Medication Management: RN will administer medications as ordered by provider, will assess and evaluate patient's response and provide education to patient for prescribed medication. RN will report any adverse and/or side effects to prescribing provider.  Therapeutic Interventions: 1 on 1 counseling sessions, Psychoeducation, Medication administration, Evaluate responses to treatment, Monitor vital signs and CBGs as ordered, Perform/monitor CIWA, COWS, AIMS and Fall Risk screenings as ordered, Perform wound care treatments as ordered.  Evaluation of Outcomes: Not Met   LCSW Treatment Plan for Primary Diagnosis: MDD (major depressive disorder), single episode with catatonic features Long Term Goal(s): Safe transition to appropriate next level of care at discharge, Engage patient in therapeutic group addressing interpersonal concerns.  Short Term Goals: Engage patient in aftercare planning with referrals and resources, Increase social support, Increase ability to appropriately verbalize feelings, Identify triggers associated with mental health/substance abuse issues and Increase skills for wellness and recovery  Therapeutic Interventions: Assess for all discharge needs, 1 to 1 time with Social worker, Explore available resources and support systems, Assess for adequacy  in community support network, Educate family and significant other(s) on suicide prevention, Complete Psychosocial Assessment, Interpersonal group therapy.  Evaluation of Outcomes: Not  Met   Progress in Treatment: Attending groups: No. Participating in groups: No. Taking medication as prescribed: Yes. Toleration medication: Yes. Family/Significant other contact made: No, will contact:  if consent is provided Patient understands diagnosis: No. Discussing patient identified problems/goals with staff: Yes. Medical problems stabilized or resolved: Yes. Denies suicidal/homicidal ideation: Yes. Issues/concerns per patient self-inventory: No.   New problem(s) identified: No, Describe:  none  New Short Term/Long Term Goal(s): medication stabilization, elimination of SI thoughts, development of comprehensive mental wellness plan.    Patient Goals:  Did not attend  Discharge Plan or Barriers: Patient recently admitted. CSW will continue to follow and assess for appropriate referrals and possible discharge planning.    Reason for Continuation of Hospitalization: Delusions  Hallucinations Medication stabilization  Estimated Length of Stay: 3-5 days  Attendees: Patient: Did not attend 04/28/2021   Physician:  04/28/2021   Nursing:  04/28/2021   RN Care Manager: 04/28/2021   Social Worker: Darletta Moll, LCSW 04/28/2021   Recreational Therapist:  04/28/2021   Other:  04/28/2021   Other:  04/28/2021  Other: 04/28/2021       Scribe for Treatment Team: Mliss Fritz, [place] 05/02/2021 11:52 AM

## 2021-05-03 MED ORDER — TRAZODONE HCL 100 MG PO TABS
100.0000 mg | ORAL_TABLET | Freq: Every day | ORAL | Status: DC
  Administered 2021-05-03 – 2021-05-04 (×2): 100 mg via ORAL
  Filled 2021-05-03: qty 1
  Filled 2021-05-03 (×2): qty 7
  Filled 2021-05-03: qty 1

## 2021-05-03 NOTE — Progress Notes (Signed)
   05/03/21 2030  Psych Admission Type (Psych Patients Only)  Admission Status Voluntary  Psychosocial Assessment  Patient Complaints Anxiety  Eye Contact Avertive  Facial Expression Flat  Affect Preoccupied  Speech Soft;Slow  Interaction Forwards little;Minimal;Childlike  Motor Activity Slow  Appearance/Hygiene Poor hygiene;In hospital gown  Behavior Characteristics Cooperative  Mood Depressed  Thought Process  Coherency Blocking  Content WDL  Delusions None reported or observed  Perception UTA  Hallucination None reported or observed  Judgment Limited  Confusion None  Danger to Self  Current suicidal ideation? Denies  Danger to Others  Danger to Others None reported or observed   Pt seen sitting in dayroom. Pt denies SI, HI, AVH and pain. Pt slow in speech. Pt rates anxiety 4/10 and denies depression. Pt says she went outside today for recreation. Pt given PO meds with pudding to help her swallow them better.

## 2021-05-03 NOTE — [facility] Group Notes (Signed)
LCSW Group Therapy Note  05/03/2021    10:00-11:00am   Type of Therapy and Topic:  Group Therapy: Early Messages Received About Anger  Participation Level:  Did Not Attend   Description of Group:   In this group, patients shared and discussed the early messages received in their lives about anger through parental or other adult modeling, teaching, repression, punishment, violence, and more.  Participants identified how those childhood lessons influence even now how they usually or often react when angered.  The group discussed that anger is a secondary emotion and what may be the underlying emotional themes that come out through anger outbursts or that are ignored through anger suppression.    Therapeutic Goals: Patients will identify one or more childhood message about anger that they received and how it was taught to them. Patients will discuss how these childhood experiences have influenced and continue to influence their own expression or repression of anger even today. Patients will explore possible primary emotions that tend to fuel their secondary emotion of anger. Patients will learn that anger itself is normal and cannot be eliminated, and that healthier coping skills can assist with resolving conflict rather than worsening situations.  Summary of Patient Progress:  Did not attend  Therapeutic Modalities:   Cognitive Behavioral Therapy Motivation Interviewing  Izayiah Tibbitts J Grossman-Orr  .  

## 2021-05-03 NOTE — Progress Notes (Signed)
   05/03/21 0631  Vital Signs  Pulse Rate (!) 122  BP 119/75  BP Location Left Arm  BP Method Automatic  Patient Position (if appropriate) Standing   D: Patient denies SI/HI/AVH. Patient rated both anxiety and depression. 4/10. Patient had brief eye contact and spoke softly. Pt. Chewed medicine. Pt isolated in room. A:  Patient took scheduled medicine. Pt. Took medicine one at a time with pudding.   Support and encouragement provided Routine safety checks conducted every 15 minutes. Patient  Informed to notify staff with any concerns.   R:  Safety maintained.

## 2021-05-03 NOTE — Progress Notes (Addendum)
[facility] MD Progress Note  05/03/2021 10:42 AM Cheryl Adams  MRN:  [id number] Subjective: Patient is a 30 year old female with a probable history of a developmental disability who presented to the [facility] emergency department on 04/24/2021 after a reported traumatic incident about a year ago. Since that time she had progressed and become nonverbal. She had also developed some screaming episodes.   Objective: Today's note:  Report received from Nursing, records reviewed and care plan discussed with members of our interdisciplinary team.  Patient was seen in the dayroom 1:1 by this provider.  Affect was bright and she willingly answered questions asked of her.  She reported that she came to the hospital for Depression and anxiety.  She rated her depression and anxiety 4/10 with 10 being severe depression and anxiety.  Patient also stated that she is gradually improving and that she is taking her medications.  She reported poor appetite and that she sleeps approximately 3-4 hours but record showed 4.25 hours last night.  She has Trazodone 50 mg for sleep as needed.  Trazodone is increased to 100 mg and scheduled every night  to improve sleep.  She is participating in most of the group activities in the unit.  She denies SI/HI/AVH at this time. Principal Problem: MDD (major depressive disorder), single episode with catatonic features Diagnosis: Principal Problem:   MDD (major depressive disorder), single episode with catatonic features  Total Time spent with patient: 25 minutes  Past Psychiatric History: See admission H&P  Past Medical History:  Past Medical History:  Diagnosis Date  . Anemia   . Chronic tonsillitis 10/2014  . Cough 11/09/2014  . Difficulty swallowing pills     Past Surgical History:  Procedure Laterality Date  . TONSILLECTOMY AND ADENOIDECTOMY Bilateral 11/14/2014   Procedure: BILATERAL TONSILLECTOMY AND ADENOIDECTOMY;  Surgeon: Flo Shanks, MD;  Location:  [facility];  Service: ENT;  Laterality: Bilateral;  . TYMPANOSTOMY TUBE PLACEMENT     Family History: History reviewed. No pertinent family history. Family Psychiatric  History: See admission H&P Social History:  Social History   Substance and Sexual Activity  Alcohol Use No     Social History   Substance and Sexual Activity  Drug Use No    Social History   Socioeconomic History  . Marital status: Single    Spouse name: Not on file  . Number of children: Not on file  . Years of education: Not on file  . Highest education level: Not on file  Occupational History  . Not on file  Tobacco Use  . Smoking status: Never Smoker  . Smokeless tobacco: Never Used  Substance and Sexual Activity  . Alcohol use: No  . Drug use: No  . Sexual activity: Not on file  Other Topics Concern  . Not on file  Social History Narrative  . Not on file   Social Determinants of Health   Financial Resource Strain: Not on file  Food Insecurity: Not on file  Transportation Needs: Not on file  Physical Activity: Not on file  Stress: Not on file  Social Connections: Not on file   Additional Social History:                         Sleep: Good  Appetite:  Good  Current Medications: Current Facility-Administered Medications  Medication Dose Route Frequency Provider Last Rate Last Admin  . acetaminophen (TYLENOL) tablet 650 mg  650 mg Oral  Q6H PRN Jackelyn PolingBerry, Jason A, NP   650 mg at 04/30/21 0925  . alum & mag hydroxide-simeth (MAALOX/MYLANTA) 200-200-20 MG/5ML suspension 30 mL  30 mL Oral Q4H PRN Nira ConnBerry, Jason A, NP      . benztropine (COGENTIN) tablet 0.5 mg  0.5 mg Oral BID PRN Mason JimSingleton, Amy E, MD   0.5 mg at 04/30/21 1030  . docusate sodium (COLACE) capsule 100 mg  100 mg Oral Daily Mason JimSingleton, Amy E, MD   100 mg at 05/03/21 0825  . escitalopram (LEXAPRO) tablet 20 mg  20 mg Oral Daily Antonieta Pertlary, Greg Lawson, MD   20 mg at 05/03/21 0825  . feeding supplement (ENSURE ENLIVE  / ENSURE PLUS) liquid 237 mL  237 mL Oral BID BM Mason JimSingleton, Amy E, MD   237 mL at 05/02/21 1450  . ferrous sulfate tablet 325 mg  325 mg Oral Daily Nira ConnBerry, Jason A, NP   325 mg at 05/03/21 0825  . LORazepam (ATIVAN) injection 1 mg  1 mg Intramuscular Q6H PRN Mason JimSingleton, Amy E, MD      . LORazepam (ATIVAN) tablet 0.5 mg  0.5 mg Oral TID Antonieta Pertlary, Greg Lawson, MD   0.5 mg at 05/03/21 0825  . magnesium hydroxide (MILK OF MAGNESIA) suspension 30 mL  30 mL Oral Daily PRN Nira ConnBerry, Jason A, NP      . risperiDONE (RISPERDAL M-TABS) disintegrating tablet 2 mg  2 mg Oral Daily Antonieta Pertlary, Greg Lawson, MD   2 mg at 05/03/21 0825  . risperiDONE (RISPERDAL M-TABS) disintegrating tablet 4 mg  4 mg Oral QHS Antonieta Pertlary, Greg Lawson, MD   4 mg at 05/02/21 2124  . traZODone (DESYREL) tablet 100 mg  100 mg Oral QHS Antonieta Pertlary, Greg Lawson, MD        Lab Results:  Results for orders placed or performed during the hospital encounter of 04/28/21 (from the past 48 hour(s))  Urinalysis, Complete w Microscopic Urine, Unspecified Source     Status: Abnormal   Collection Time: 05/02/21  5:18 PM  Result Value Ref Range   Color, Urine STRAW (A) YELLOW   APPearance CLEAR CLEAR   Specific Gravity, Urine 1.004 (L) 1.005 - 1.030   pH 7.0 5.0 - 8.0   Glucose, UA NEGATIVE NEGATIVE mg/dL   Hgb urine dipstick SMALL (A) NEGATIVE   Bilirubin Urine NEGATIVE NEGATIVE   Ketones, ur NEGATIVE NEGATIVE mg/dL   Protein, ur NEGATIVE NEGATIVE mg/dL   Nitrite NEGATIVE NEGATIVE   Leukocytes,Ua TRACE (A) NEGATIVE   RBC / HPF 0-5 0 - 5 RBC/hpf   WBC, UA 0-5 0 - 5 WBC/hpf   Bacteria, UA RARE (A) NONE SEEN   Squamous Epithelial / LPF 0-5 0 - 5    Comment: Performed at [facility], [address] [place], [place] [address]  Lipid panel     Status: None   Collection Time: 05/02/21  6:17 PM  Result Value Ref Range   Cholesterol 168 0 - 200 mg/dL   Triglycerides 41 <604<150 mg/dL   HDL 66 >54>40 mg/dL   Total CHOL/HDL Ratio 2.5 RATIO   VLDL 8  0 - 40 mg/dL   LDL Cholesterol 94 0 - 99 mg/dL    Comment:        Total Cholesterol/HDL:CHD Risk Coronary Heart Disease Risk Table                     Men   Women  1/2 Average Risk   3.4   3.3  Average Risk       5.0   4.4  2 X Average Risk   9.6   7.1  3 X Average Risk  23.4   11.0        Use the calculated Patient Ratio above and the CHD Risk Table to determine the patient's CHD Risk.        ATP III CLASSIFICATION (LDL):  <100     mg/dL   Optimal  098-119  mg/dL   Near or Above                    Optimal  130-159  mg/dL   Borderline  147-829  mg/dL   High  >562     mg/dL   Very High Performed at [facility], [address], [place], [place] [address]     Blood Alcohol level:  Lab Results  Component Value Date   ETH <10 04/24/2021    Metabolic Disorder Labs: Lab Results  Component Value Date   HGBA1C 5.8 (H) 04/29/2021   MPG 120 04/29/2021   No results found for: PROLACTIN Lab Results  Component Value Date   CHOL 168 05/02/2021   TRIG 41 05/02/2021   HDL 66 05/02/2021   CHOLHDL 2.5 05/02/2021   VLDL 8 05/02/2021   LDLCALC 94 05/02/2021   LDLCALC 92 04/29/2021    Physical Findings: AIMS: Facial and Oral Movements Muscles of Facial Expression: None, normal Lips and Perioral Area: None, normal Jaw: None, normal Tongue: None, normal,Extremity Movements Upper (arms, wrists, hands, fingers): None, normal Lower (legs, knees, ankles, toes): None, normal, Trunk Movements Neck, shoulders, hips: None, normal, Overall Severity Severity of abnormal movements (highest score from questions above): None, normal Incapacitation due to abnormal movements: None, normal Patient's awareness of abnormal movements (rate only patient's report): No Awareness, Dental Status Current problems with teeth and/or dentures?: No Does patient usually wear dentures?: No  CIWA:    COWS:     Musculoskeletal: Strength & Muscle Tone: within normal limits Gait &  Station: normal Patient leans: N/A  Psychiatric Specialty Exam:  Presentation  General Appearance: Appropriate for Environment  Eye Contact:Fair  Speech:Normal Rate  Speech Volume:Decreased  Handedness:Right   Mood and Affect  Mood:Anxious  Affect:Congruent   Thought Process  Thought Processes:Goal Directed  Descriptions of Associations:Circumstantial  Orientation:Full (Time, Place and Person)  Thought Content:Logical  History of Schizophrenia/Schizoaffective disorder:No  Duration of Psychotic Symptoms:Less than six months  Hallucinations:Hallucinations: None  Ideas of Reference:None  Suicidal Thoughts:Suicidal Thoughts: No  Homicidal Thoughts:Homicidal Thoughts: No   Sensorium  Memory:Immediate Fair; Recent Fair; Remote Fair  Judgment:Fair  Insight:Fair   Executive Functions  Concentration:Fair  Attention Span:Fair  Recall:Fair  Fund of Knowledge:Fair  Language:Fair   Psychomotor Activity  Psychomotor Activity:Psychomotor Activity: Decreased   Assets  Assets:Desire for Improvement; Resilience   Sleep  Sleep:Sleep: Good Number of Hours of Sleep: 7    Physical Exam: Physical Exam Vitals and nursing note reviewed.  Constitutional:      Appearance: Normal appearance.  HENT:     Head: Normocephalic and atraumatic.  Pulmonary:     Effort: Pulmonary effort is normal.  Neurological:     General: No focal deficit present.     Mental Status: She is alert and oriented to person, place, and time.    Review of Systems  All other systems reviewed and are negative.  Blood pressure 119/75, pulse (!) 122, temperature 98.4 F (36.9 C), temperature source Oral, resp.  rate 16, height 5\' 2"  (1.575 m), weight 93.9 kg, last menstrual period 04/07/2021, SpO2 99 %. Body mass index is 37.86 kg/m.   Treatment Plan Summary: Daily contact with patient to assess and evaluate symptoms and progress in treatment, Medication management and Plan :  Patient is seen and examined.  Patient is a 30 year old female with the above-stated past psychiatric history who is seen in follow-up.   Diagnosis: 1  .Unspecified mood disorder. 2. Concern for underlying developmental disability. 3. Unspecified catatonia. 4. Major depression single episode with catatonic features 5. Delusionary versus hallucinatory symptoms 6.  Abnormal urinalysis 7.  Tachycardia  Pertinent findings on examination today: 1.  Patient continues to improve slowly with regard to speech rate, catatonic symptoms, mood and anxiety.   Plan: 1.  Continue Risperdal to 2 mg p.o. daily and 4 mg p.o. nightly for mood stability, sleep and psychosis. 2. Continue Cogentin 0.5 mg p.o. twice daily as needed tremor. 3. Continue Colace 100 mg p.o. daily for constipation. 4.  Continue Lexapro to 20 mg p.o. daily for anxiety and depression. 5. Continue ferrous sulfate 325 mg p.o. daily for anemia. 6.  Continue lorazepam to 0.5 mg p.o. 3 times daily for anxiety and catatonia. 7. Continue lorazepam 1 mg IM every 6 hours as needed catatonia. 8.  Repeat urinalysis 9.  Repeat EKG 10.    Increase Trazodone  To 100  mg p.o. nightly for  insomnia. 11.  Disposition planning-in progress.  26, NP- 05/03/2021, 10:42 AM

## 2021-05-04 NOTE — Progress Notes (Signed)
   05/04/21 2248  Psych Admission Type (Psych Patients Only)  Admission Status Voluntary  Psychosocial Assessment  Patient Complaints Anxiety;Depression  Eye Contact Avertive  Facial Expression Flat  Affect Preoccupied  Speech Soft;Slow  Interaction Forwards little;Minimal;Childlike  Motor Activity Slow  Appearance/Hygiene Poor hygiene;In hospital gown  Behavior Characteristics Anxious;Guarded  Mood Anxious;Depressed  Thought Process  Coherency Blocking  Content WDL  Delusions None reported or observed  Perception WDL  Hallucination None reported or observed  Judgment Limited  Confusion None  Danger to Self  Current suicidal ideation? Denies  Danger to Others  Danger to Others None reported or observed  D: Patient in dayroom quietly sitting. Pt calm and cooperative. Pt answered all of writer's  questions. Pt reports she is tolerating medication well. A: Medications administered as prescribed. Support and encouragement provided as needed.  R: Patient remains safe on the unit. Will continue to monitor for safety and stability.

## 2021-05-04 NOTE — Progress Notes (Signed)
Patient attended group but slept for the duration of the group.

## 2021-05-04 NOTE — [facility] Group Notes (Signed)
[facility] LCSW Group Therapy Note  05/04/2021    Type of Therapy and Topic:  Group Therapy:  A Hero Worthy of Support  Participation Level:  Minimal   Description of Group:  Patients in this group were introduced to the concept that additional supports including self-support are an essential part of recovery.  Matching needs with supports to help fulfill those needs was explained.  Establishing boundaries that can gradually be increased or decreased was described, with patients giving their own examples of establishing appropriate boundaries in their lives.  A song entitled "My Own Hero" was played and a group discussion ensued in which patients stated it inspired them to help themselves in order to succeed, because other people cannot achieve their goals such as sobriety or stability for them.  A song was played called "I Am Enough" which led to a discussion about being willing to believe we are worth the effort of being a self-support.   Therapeutic Goals: 1)  demonstrate the importance of being a key part of one's own support system 2)  discuss various available supports 3)  encourage patient to use music as part of their self-support and focus on goals 4)  elicit ideas from patients about supports that need to be added   Summary of Patient Progress:  The patient expressed that one support she needs at hospital discharge is "routine."  We talked about how a schedule is often seen as more helpful when people experience the schedule in the hospital.  She did not talk any more, although she remained attentive to each speaker and to the music as it played.  Therapeutic Modalities:   Motivational Interviewing Activity  Lynnell Chad

## 2021-05-04 NOTE — [facility] Suicide Risk Assessment (Signed)
[facility] INPATIENT:  Family/Significant Other Suicide Prevention Education   Mother reports that the biggest issues were lack of sleep and not eating for 2-3 weeks prior to.  Mother feels she needs 1 more day, but is willing to come get her today if needed.  Since the provider's note indicates she is still having a poor appetite, with 4.25 hours of sleep, she would like the patient to stay a bit longer.  She does not want to take a chance on her not being well enough, and needing to turn around and be readmitted quickly.    We talked about discharge tomorrow, and she stated she can pick up at 1:00pm on Monday.  If we decide to discharge today (Sunday 6/5), CSW is to call her back.   Suicide Prevention Education:  Education Completed; Mother Dainelle Hun [phone],  (name of family member/significant other) has been identified by the patient as the family member/significant other with whom the patient will be residing, and identified as the person(s) who will aid the patient in the event of a mental health crisis (suicidal ideations/suicide attempt).  With written consent from the patient, the family member/significant other has been provided the following suicide prevention education, prior to the and/or following the discharge of the patient.  The suicide prevention education provided includes the following:  Suicide risk factors  Suicide prevention and interventions  National Suicide Hotline telephone number  [facility] assessment telephone number  [facility] Emergency Assistance 911  [facility] and/or Residential Mobile Crisis Unit telephone number  Request made of family/significant other to:  Remove weapons (e.g., guns, rifles, knives), all items previously/currently identified as safety concern.    Remove drugs/medications (over-the-counter, prescriptions, illicit drugs), all items previously/currently identified as a safety concern.  The family  member/significant other verbalizes understanding of the suicide prevention education information provided.  The family member/significant other agrees to remove the items of safety concern listed above.  Cheryl Adams 05/04/2021, 12:07 PM

## 2021-05-04 NOTE — Plan of Care (Signed)
Nurse discussed coping skills with patient.  

## 2021-05-04 NOTE — Progress Notes (Signed)
D:  Patient's self inventory sheet, patient has poor sleep, sleep medication helpful.  Poor appetite, low energy level, poor concentration.  Rated depression and hopeless, anxiety #7.  Denied withdrawals.  Denied SI.  Denied physical problems.  Denied physical pain.  Goal is talk to peers.  Plans to do daily activities.  No discharge plans. A:  Medications administered per MD orders.  Emotional support and encouragement given patient. R:  Denied SI and HI, contracts for safety.  Denied A/V hallucinations.  Safety maintained with 15 minute checks.

## 2021-05-04 NOTE — [facility] Group Notes (Signed)
Adult Psychoeducational Group Note  Date:  05/04/2021 Time:  10:43 AM  Group Topic/Focus:  Goals Group:   The focus of this group is to help patients establish daily goals to achieve during treatment and discuss how the patient can incorporate goal setting into their daily lives to aide in recovery.  Participation Level:  Active  Participation Quality:  Appropriate  Affect:  Appropriate  Cognitive:  Appropriate  Insight: Appropriate  Engagement in Group:  Engaged  Modes of Intervention:  Education  Additional Comments:  Pt goal today is to talk with her peers more. Pt has no feelings of wanting to hurt herself or others.  Corsica Franson, Sharen Counter 05/04/2021, 10:43 AM

## 2021-05-04 NOTE — Progress Notes (Addendum)
[facility] MD Progress Note  05/04/2021 3:21 PM Cheryl Adams  MRN:  [id number] Subjective: " I am doing better but sleep is still not the best" Reason for Hospitalization:  Patient is a 30 year old female with a probable history of a developmental disability who presented to the [facility] emergency department on 04/24/2021 after a reported traumatic incident about a year ago. Since that time she had progressed and become nonverbal. She had also developed some screaming episodes.   Objective: Today's note:  Nursing report received, Records reviewed and care reviewed with members of our interdisciplinary team.  Patient today rated depression 4/10 with 10 being severe depression.   She is seen visibly in the unit walking about.  Patient, despite an increase in Trazodone slept only 3.75 hours last night.  She stated that her appetite is gradually improving but not much.  She comes to group with limited participation but pays attention to what is being said.    She denied SI/HI/AVH and no mention of paranoia.  She is for possible discharge to her mother  Tomorrow. No catatonic behavior/symptoms noted as she is interacting with staff and answers questions. Principal Problem: MDD (major depressive disorder), single episode with catatonic features Diagnosis: Principal Problem:   MDD (major depressive disorder), single episode with catatonic features  Total Time spent with patient: 15 minutes  Past Psychiatric History: See admission H&P  Past Medical History:  Past Medical History:  Diagnosis Date  . Anemia   . Chronic tonsillitis 10/2014  . Cough 11/09/2014  . Difficulty swallowing pills     Past Surgical History:  Procedure Laterality Date  . TONSILLECTOMY AND ADENOIDECTOMY Bilateral 11/14/2014   Procedure: BILATERAL TONSILLECTOMY AND ADENOIDECTOMY;  Surgeon: Flo Shanks, MD;  Location: [facility];  Service: ENT;  Laterality: Bilateral;  . TYMPANOSTOMY TUBE PLACEMENT      Family History: History reviewed. No pertinent family history. Family Psychiatric  History: See admission H&P Social History:  Social History   Substance and Sexual Activity  Alcohol Use No     Social History   Substance and Sexual Activity  Drug Use No    Social History   Socioeconomic History  . Marital status: Single    Spouse name: Not on file  . Number of children: Not on file  . Years of education: Not on file  . Highest education level: Not on file  Occupational History  . Not on file  Tobacco Use  . Smoking status: Never Smoker  . Smokeless tobacco: Never Used  Substance and Sexual Activity  . Alcohol use: No  . Drug use: No  . Sexual activity: Not on file  Other Topics Concern  . Not on file  Social History Narrative  . Not on file   Social Determinants of Health   Financial Resource Strain: Not on file  Food Insecurity: Not on file  Transportation Needs: Not on file  Physical Activity: Not on file  Stress: Not on file  Social Connections: Not on file   Additional Social History:                         Sleep: Fair  Appetite:  Good  Current Medications: Current Facility-Administered Medications  Medication Dose Route Frequency Provider Last Rate Last Admin  . acetaminophen (TYLENOL) tablet 650 mg  650 mg Oral Q6H PRN Nira Conn A, NP   650 mg at 04/30/21 0925  . alum & mag hydroxide-simeth (  MAALOX/MYLANTA) 200-200-20 MG/5ML suspension 30 mL  30 mL Oral Q4H PRN Nira Conn A, NP      . benztropine (COGENTIN) tablet 0.5 mg  0.5 mg Oral BID PRN Bartholomew Crews E, MD   0.5 mg at 04/30/21 1030  . docusate sodium (COLACE) capsule 100 mg  100 mg Oral Daily Mason Jim, Amy E, MD   100 mg at 05/04/21 0750  . escitalopram (LEXAPRO) tablet 20 mg  20 mg Oral Daily Antonieta Pert, MD   20 mg at 05/04/21 0751  . feeding supplement (ENSURE ENLIVE / ENSURE PLUS) liquid 237 mL  237 mL Oral BID BM Mason Jim, Amy E, MD   237 mL at 05/04/21 0754  .  ferrous sulfate tablet 325 mg  325 mg Oral Daily Nira Conn A, NP   325 mg at 05/04/21 0751  . LORazepam (ATIVAN) injection 1 mg  1 mg Intramuscular Q6H PRN Mason Jim, Amy E, MD      . LORazepam (ATIVAN) tablet 0.5 mg  0.5 mg Oral TID Antonieta Pert, MD   0.5 mg at 05/04/21 1204  . magnesium hydroxide (MILK OF MAGNESIA) suspension 30 mL  30 mL Oral Daily PRN Nira Conn A, NP      . risperiDONE (RISPERDAL M-TABS) disintegrating tablet 2 mg  2 mg Oral Daily Antonieta Pert, MD   2 mg at 05/04/21 0751  . risperiDONE (RISPERDAL M-TABS) disintegrating tablet 4 mg  4 mg Oral QHS Antonieta Pert, MD   4 mg at 05/03/21 2108  . traZODone (DESYREL) tablet 100 mg  100 mg Oral QHS Antonieta Pert, MD   100 mg at 05/03/21 2108    Lab Results:  Results for orders placed or performed during the hospital encounter of 04/28/21 (from the past 48 hour(s))  Urinalysis, Complete w Microscopic Urine, Unspecified Source     Status: Abnormal   Collection Time: 05/02/21  5:18 PM  Result Value Ref Range   Color, Urine STRAW (A) YELLOW   APPearance CLEAR CLEAR   Specific Gravity, Urine 1.004 (L) 1.005 - 1.030   pH 7.0 5.0 - 8.0   Glucose, UA NEGATIVE NEGATIVE mg/dL   Hgb urine dipstick SMALL (A) NEGATIVE   Bilirubin Urine NEGATIVE NEGATIVE   Ketones, ur NEGATIVE NEGATIVE mg/dL   Protein, ur NEGATIVE NEGATIVE mg/dL   Nitrite NEGATIVE NEGATIVE   Leukocytes,Ua TRACE (A) NEGATIVE   RBC / HPF 0-5 0 - 5 RBC/hpf   WBC, UA 0-5 0 - 5 WBC/hpf   Bacteria, UA RARE (A) NONE SEEN   Squamous Epithelial / LPF 0-5 0 - 5    Comment: Performed at [facility], [address], [place], [place] [address]  Lipid panel     Status: None   Collection Time: 05/02/21  6:17 PM  Result Value Ref Range   Cholesterol 168 0 - 200 mg/dL   Triglycerides 41 <878 mg/dL   HDL 66 >67 mg/dL   Total CHOL/HDL Ratio 2.5 RATIO   VLDL 8 0 - 40 mg/dL   LDL Cholesterol 94 0 - 99 mg/dL    Comment:        Total  Cholesterol/HDL:CHD Risk Coronary Heart Disease Risk Table                     Men   Women  1/2 Average Risk   3.4   3.3  Average Risk       5.0   4.4  2 X  Average Risk   9.6   7.1  3 X Average Risk  23.4   11.0        Use the calculated Patient Ratio above and the CHD Risk Table to determine the patient's CHD Risk.        ATP III CLASSIFICATION (LDL):  <100     mg/dL   Optimal  768-115  mg/dL   Near or Above                    Optimal  130-159  mg/dL   Borderline  726-203  mg/dL   High  >559     mg/dL   Very High Performed at [facility], [address] [place], [place] [address]     Blood Alcohol level:  Lab Results  Component Value Date   ETH <10 04/24/2021    Metabolic Disorder Labs: Lab Results  Component Value Date   HGBA1C 5.8 (H) 04/29/2021   MPG 120 04/29/2021   No results found for: PROLACTIN Lab Results  Component Value Date   CHOL 168 05/02/2021   TRIG 41 05/02/2021   HDL 66 05/02/2021   CHOLHDL 2.5 05/02/2021   VLDL 8 05/02/2021   LDLCALC 94 05/02/2021   LDLCALC 92 04/29/2021    Physical Findings: AIMS: Facial and Oral Movements Muscles of Facial Expression: None, normal Lips and Perioral Area: None, normal Jaw: None, normal Tongue: None, normal,Extremity Movements Upper (arms, wrists, hands, fingers): None, normal Lower (legs, knees, ankles, toes): None, normal, Trunk Movements Neck, shoulders, hips: None, normal, Overall Severity Severity of abnormal movements (highest score from questions above): None, normal Incapacitation due to abnormal movements: None, normal Patient's awareness of abnormal movements (rate only patient's report): No Awareness, Dental Status Current problems with teeth and/or dentures?: No Does patient usually wear dentures?: No  CIWA:    COWS:     Musculoskeletal: Strength & Muscle Tone: within normal limits Gait & Station: normal Patient leans: N/A  Psychiatric Specialty Exam: Psychiatric  Specialty Exam: Physical Exam Vitals and nursing note reviewed.  Constitutional:      Appearance: Normal appearance.  HENT:     Head: Normocephalic and atraumatic.  Pulmonary:     Effort: Pulmonary effort is normal.  Neurological:     General: No focal deficit present.     Mental Status: She is alert and oriented to person, place, and time.     Review of Systems  All other systems reviewed and are negative.   Blood pressure 132/85, pulse (!) 105, temperature (!) 97.5 F (36.4 C), temperature source Oral, resp. rate 16, height 5\' 2"  (1.575 m), weight 93.9 kg, last menstrual period 04/07/2021, SpO2 100 %.Body mass index is 37.86 kg/m.  General Appearance: Casual and Fairly Groomed  Eye Contact:  Good  Speech:  Slow  Volume:  Decreased  Mood:  Depressed  Affect:  Congruent  Thought Process:  Coherent and Linear  Orientation:  Full (Time, Place, and Person)  Thought Content:  Logical  Suicidal Thoughts:  No  Homicidal Thoughts:  No  Memory:  Immediate;   Good Recent;   Good Remote;   Good  Judgement:  Good  Insight:  Good  Psychomotor Activity:  Decreased  Concentration:  Concentration: Good and Attention Span: Good  Recall:  06/07/2021 of Knowledge:  Fair  Language:  Good  Akathisia:  NA  Handed:  Right  AIMS (if indicated):     Assets:  Communication Skills Desire for  Improvement Housing Physical Health  ADL's:  Intact  Cognition:  WNL  Sleep:  Number of Hours: 3.75    Presentation  General Appearance: Appropriate for Environment  Eye Contact:Fair  Speech:Normal Rate  Speech Volume:Decreased  Handedness:Right   Mood and Affect  Mood:Anxious  Affect:Congruent   Thought Process  Thought Processes:Goal Directed  Descriptions of Associations:Circumstantial  Orientation:Full (Time, Place and Person)  Thought Content:Logical  History of Schizophrenia/Schizoaffective disorder:No  Duration of Psychotic Symptoms:Less than six  months  Hallucinations:No data recorded  Ideas of Reference:None  Suicidal Thoughts:No data recorded  Homicidal Thoughts:No data recorded   Sensorium  Memory:Immediate Fair; Recent Fair; Remote Fair  Judgment:Fair  Insight:Fair   Executive Functions  Concentration:Fair  Attention Span:Fair  Recall:Fair  Fund of Knowledge:Fair  Language:Fair   Psychomotor Activity  Psychomotor Activity:No data recorded   Assets  Assets:Desire for Improvement; Resilience   Sleep  Sleep:No data recorded    Physical Exam: Physical Exam Vitals and nursing note reviewed.  Constitutional:      Appearance: Normal appearance.  HENT:     Head: Normocephalic and atraumatic.  Pulmonary:     Effort: Pulmonary effort is normal.  Neurological:     General: No focal deficit present.     Mental Status: She is alert and oriented to person, place, and time.    Review of Systems  All other systems reviewed and are negative.  Blood pressure 132/85, pulse (!) 105, temperature (!) 97.5 F (36.4 C), temperature source Oral, resp. rate 16, height 5\' 2"  (1.575 m), weight 93.9 kg, last menstrual period 04/07/2021, SpO2 100 %. Body mass index is 37.86 kg/m.   Treatment Plan Summary: Daily contact with patient to assess and evaluate symptoms and progress in treatment, Medication management and Plan : Patient is seen and examined.  Patient is a 30 year old female with the above-stated past psychiatric history who is seen in follow-up.   Diagnosis: 1  .Unspecified mood disorder. 2. Concern for underlying developmental disability. 3. Unspecified catatonia. 4. Major depression single episode with catatonic features 5. Delusionary versus hallucinatory symptoms 6.  Abnormal urinalysis 7.  Tachycardia  Pertinent findings on examination today: 1.  Patient continues to improve slowly with regard to speech rate, mood and anxiety. Plan: 1.  Continue Risperdal to 2 mg p.o. daily and 4 mg  p.o. nightly for mood stability, sleep and psychosis. 2. Continue Cogentin 0.5 mg p.o. twice daily as needed tremor. 3. Continue Colace 100 mg p.o. daily for constipation. 4.  Continue Lexapro to 20 mg p.o. daily for anxiety and depression. 5. Continue ferrous sulfate 325 mg p.o. daily for anemia. 6.  Continue lorazepam to 0.5 mg p.o. 3 times daily for anxiety and catatonia. 7. Continue lorazepam 1 mg IM every 6 hours as needed catatonia. 8.  Repeat urinalysis 9.  Repeat EKG 10.    Continue  Trazodone  To 100  mg p.o. nightly for  insomnia. 11.  Disposition planning-in progress.-Possible discharge tomorrow  Earney NavyJosephine C Alinah Sheard, NP-PMHNP-BC 05/04/2021, 3:21 PM

## 2021-05-05 ENCOUNTER — Encounter: Payer: Medicaid Other | Admitting: Student

## 2021-05-05 MED ORDER — RISPERIDONE 4 MG PO TBDP: 4.0000 mg | ORAL_TABLET | Freq: Every day | ORAL | 0 refills | Status: DC

## 2021-05-05 MED ORDER — BENZTROPINE MESYLATE 0.5 MG PO TABS: 0.5000 mg | ORAL_TABLET | Freq: Two times a day (BID) | ORAL | 0 refills | Status: DC | PRN

## 2021-05-05 MED ORDER — ESCITALOPRAM OXALATE 20 MG PO TABS: 20.0000 mg | ORAL_TABLET | Freq: Every day | ORAL | 0 refills | Status: DC

## 2021-05-05 MED ORDER — LORAZEPAM 0.5 MG PO TABS: 0.5000 mg | ORAL_TABLET | Freq: Three times a day (TID) | ORAL | 0 refills | Status: DC

## 2021-05-05 MED ORDER — TRAZODONE HCL 100 MG PO TABS: 100.0000 mg | ORAL_TABLET | Freq: Every day | ORAL | 0 refills | Status: DC

## 2021-05-05 MED ORDER — RISPERIDONE 2 MG PO TBDP: 2.0000 mg | ORAL_TABLET | Freq: Every day | ORAL | 0 refills | Status: DC

## 2021-05-05 NOTE — [facility] Group Notes (Signed)
Spiritual care group on grief and loss facilitated by chaplain Janetta Hora Lomax, BCC, LCAS-A  Group Goal: Support / Education around grief and loss   Members engage in facilitated group support and psycho-social education.   Group Description:   Following introductions and group rules, group members engaged in facilitated group dialogue and support around topic of loss, with particular support around experiences of loss in their lives. Group Identified types of loss (relationships / self / things) and identified their own healthy and unhealthy coping patternss as well as childhood messages received about grief. Reflected on thoughts / feelings around loss, normalized grief responses, and recognized variety in grief experience.   Group drew on Adlerian / Rogerian, narrative, Motivational Interviewing, Mindfulness   Level of Engagement/Patient Progress: Patient was alert and oriented x 4 and was quiet in group, but appeared to be actively listening. She named that one way she has coped with grief in an unhealthy way is to overeat.    Cheryl Adams. Cheryl Adams, M.Div. [facility] Chaplain Pager [phone] Office [phone]

## 2021-05-05 NOTE — Progress Notes (Signed)
D:  Patient's self inventory sheet, patient has fair sleep, sleep medication helpful.  Fair appetite, low energy level, poor concentration.  Rated depression, hopeless and anxiety #4.  Denied withdrawals.  Denied SI.  Denied physical problems.  Denied physical pain.  Goal is talk with others.  Plans to talk with peers.  No discharge plans. A:  Medications administered per MD orders.  Emotional support and encouragement given patient. R:  Denied SI and HI, contracts for safety.  Denied A/V hallucinations.  Safety maintained with 15 minute checks.

## 2021-05-05 NOTE — Discharge Summary (Signed)
Physician Discharge Summary Note  Patient:  Cheryl Adams is an 30 y.o., female MRN:  [id number] DOB:  February 18, 1991 Patient phone:  [phone] (home)  Patient address:   [address] [place] [address],  Total Time spent with patient: 30 minutes  Date of Admission:  04/28/2021 Date of Discharge: 05/05/2021  Reason for Admission:  (From MD's admission note): Cheryl Adams is a 30 y.o. female who presented to [facility] on 04/24/2021 with her mother. It appears that on arrival to the ED she was non verbal, but responding to commands and nodding yes or no.  At that time, her mother reported that the patient had a traumatic event last year. She reported that the patient has not been eating well, not leaving the home, and required assistance with ADLs. Her mother reported that the patient has been having screaming episodes. Per ED provider notes, the patient was screaming and rocking back and forth.   On evaluation at [facility], the patient is alert. She is oriented to person and place. She is unable to recall the day/date. She does state that she was in the ED for 3-4 days, which is accurate. She states that on the day that she went to the ED, she was sleeping and "woke up and saw something like a figure." She states that it scared her so she woke up her sister who told her to stay calm until their mother arrived. Although patient initially stated that she saw a figure, she then stated "I didn't actually see anything, but it felt like someone was there but no one was." She reports that she has been feeling depressed for approximately one year. She states that she has never received psychiatric care. She reports feeling sad most days of the week. Patient endorses depressive symptoms, including low mood, insomnia, feelings of guilt/worthlessness/hopelessness, problems with energy, problems with concentration, anxiety, irritability, and decreased appetite. She states that she sleeps approximately 3-4 hours a night and  feels tired throughout the day. She states that her appetite has been decreased for the past two months. She states that she thinks she has lost weight, but she does not know how much. Patient denies symptoms of mania or hypomania, including euphoric mood, increased activity, impulsive/reckless behavior, increased talkativeness, and excessive self-confidence that lasted for several consecutive days.  Patient denies auditory hallucinations. On chart review, it is is noted that she reported AH while in the ED but was unable to describe. She reports visual hallucinations of "nature and animals." Patient giggled a couple of times during the assessment as she was looking away from the provider, she was not able to state why she was laughing. Unclear is she was responding to internal stimuli. She denies paranoia. No delusions illicited during this assessment. She denies a trauma history. She denies suicidal ideations. Denies a history of suicide attempts. Denies homicidal ideations. Deneis use of alcohol, marijuana, and other substances. Patient identifies finding a place to live as a stressor. She denies homelessness. States that she lives with her sister, [name].   Patient was started on olanzapine 5 mg daily and temazepam 15 mg QHS while in the ED. She states that she is tolerating the medications well. Patient received Geodon 20 mg IM on 04/26/2021 for agitation and ativan 2 mg IM on 04/27/21 for agitation.   Evaluation on the unit: Patient was seen and chart reviewed. Patient denies SI/HI/AVH, paranoia and delusions. Patient reported good sleep and a good appetite. Record shows she slept 5.25 hours  last night. She is compliant with her medications and has no issues with them. She rated her anxiety as a 3/10 and depression as 3/10, with 10 being the worst. She has been receiving Ativan 0.5 mg tid since 6/2 for anxiety and catatonia. I explained to her that we will send her with a script for a few days worth  of this medication and she will need to follow up outpatient for any refills. Patient is attending group therapy and interacting appropriately with staff and peers. Patient will be returning home with her mother. Her vital signs are stable, she is afebrile. Patient is stable for discharge home today.   Principal Problem: MDD (major depressive disorder), single episode with catatonic features Discharge Diagnoses: Principal Problem:   MDD (major depressive disorder), single episode with catatonic features   Past Psychiatric History: See H&P  Past Medical History:  Past Medical History:  Diagnosis Date  . Anemia   . Chronic tonsillitis 10/2014  . Cough 11/09/2014  . Difficulty swallowing pills     Past Surgical History:  Procedure Laterality Date  . TONSILLECTOMY AND ADENOIDECTOMY Bilateral 11/14/2014   Procedure: BILATERAL TONSILLECTOMY AND ADENOIDECTOMY;  Surgeon: Flo ShanksKarol Wolicki, MD;  Location: [facility];  Service: ENT;  Laterality: Bilateral;  . TYMPANOSTOMY TUBE PLACEMENT     Family History: History reviewed. No pertinent family history. Family Psychiatric  History: See H&P Social History:  Social History   Substance and Sexual Activity  Alcohol Use No     Social History   Substance and Sexual Activity  Drug Use No    Social History   Socioeconomic History  . Marital status: Single    Spouse name: Not on file  . Number of children: Not on file  . Years of education: Not on file  . Highest education level: Not on file  Occupational History  . Not on file  Tobacco Use  . Smoking status: Never Smoker  . Smokeless tobacco: Never Used  Substance and Sexual Activity  . Alcohol use: No  . Drug use: No  . Sexual activity: Not on file  Other Topics Concern  . Not on file  Social History Narrative  . Not on file   Social Determinants of Health   Financial Resource Strain: Not on file  Food Insecurity: Not on file  Transportation Needs: Not on file   Physical Activity: Not on file  Stress: Not on file  Social Connections: Not on file    Hospital Course:  After the above admission evaluation, Aleeha's presenting symptoms were noted. She was recommended for mood stabilization treatments. The medication regimen targeting those presenting symptoms were discussed with her & initiated with her consent. She is being discharged on Cogentin PRN for EPS, Lexapro and Risperdal for her mood and depression, low dose ativan for 7 day supply for her anxiety and catatonia and Trazodone for sleep. She was educated that a short supply of Ativan will be provided for her and she will have to follow up with her outpatient provider for any additional refills. It was explained to her that this medication is to be used for a short period of time. Given her anxiety and catatonia on admission is the reason for the use of this medication during her hospitalization. Her UDS and BAL on arrival to the ED were negative. She medicated, stabilized & discharged on the medications as listed on her discharge medication list below. Besides the mood stabilization treatments, [name] was also enrolled & participated  in the group counseling sessions being offered & held on this unit. She learned coping skills. She presented no other significant pre-existing medical issues that required treatment. She tolerated his treatment regimen without any adverse effects or reactions reported.   During the course of her hospitalization, the 15-minute checks were adequate to ensure patient's safety. Jaquitta did not display any dangerous, violent or suicidal behavior on the unit.  She interacted with patients & staff appropriately, participated appropriately in the group sessions/therapies. Her medications were addressed & adjusted to meet her needs. She was recommended for outpatient follow-up care & medication management upon discharge to assure continuity of care & mood stability.  At the time of discharge  patient is not reporting any acute suicidal/homicidal ideations. She feels more confident about her self-care & in managing her mental health. She currently denies any new issues or concerns. Education and supportive counseling provided throughout her hospital stay & upon discharge.   Today upon her discharge evaluation with the attending psychiatrist, Teela shares she is doing well. She denies any other specific concerns. She is sleeping well, she slept 6 hours last night. Her appetite is good. She denies other physical complaints. She denies AH/VH, delusional thoughts or paranoia. She does not appear to be responding to any internal stimuli. She feels that her medications have been helpful & is in agreement to continue her current treatment regimen as recommended. She was able to engage in safety planning including plan to return to [facility] or contact emergency services if she feels unable to maintain her own safety or the safety of others. Pt had no further questions, comments, or concerns. She left [facility] with all personal belongings in no apparent distress. Transportation per private vehicle with her mother.   Physical Findings: AIMS: Facial and Oral Movements Muscles of Facial Expression: None, normal Lips and Perioral Area: None, normal Jaw: None, normal Tongue: None, normal,Extremity Movements Upper (arms, wrists, hands, fingers): None, normal Lower (legs, knees, ankles, toes): None, normal, Trunk Movements Neck, shoulders, hips: None, normal, Overall Severity Severity of abnormal movements (highest score from questions above): None, normal Incapacitation due to abnormal movements: None, normal Patient's awareness of abnormal movements (rate only patient's report): No Awareness, Dental Status Current problems with teeth and/or dentures?: No Does patient usually wear dentures?: No  CIWA:    COWS:     Musculoskeletal: Strength & Muscle Tone: within normal limits Gait & Station: normal Patient  leans: N/A  Psychiatric Specialty Exam:  Presentation  General Appearance: Fairly Groomed  Eye Contact:Fair  Speech:Normal Rate  Speech Volume:Decreased  Handedness:Right  Mood and Affect  Mood:Anxious  Affect:Congruent  Thought Process  Thought Processes:Goal Directed  Descriptions of Associations:Circumstantial  Orientation:Full (Time, Place and Person)  Thought Content:Logical  History of Schizophrenia/Schizoaffective disorder:No  Duration of Psychotic Symptoms:Less than six months  Hallucinations:Hallucinations: None  Ideas of Reference:None  Suicidal Thoughts:Suicidal Thoughts: No  Homicidal Thoughts:Homicidal Thoughts: No  Sensorium  Memory:Immediate Fair; Recent Fair; Remote Fair  Judgment:Fair  Insight:Fair  Executive Functions  Concentration:Fair  Attention Span:Fair  Recall:Fair  Fund of Knowledge:Fair  Language:Fair  Psychomotor Activity  Psychomotor Activity:Psychomotor Activity: Decreased  Assets  Assets:Housing; Desire for Improvement; Resilience; Social Support  Sleep  Sleep:Sleep: Good Number of Hours of Sleep: 6  Physical Exam: Physical Exam Vitals and nursing note reviewed.  Constitutional:      Appearance: Normal appearance.  HENT:     Head: Normocephalic.  Pulmonary:     Effort: Pulmonary effort is normal.  Musculoskeletal:  General: Normal range of motion.     Cervical back: Normal range of motion.  Neurological:     Mental Status: She is alert and oriented to person, place, and time.  Psychiatric:        Attention and Perception: Attention normal. She does not perceive auditory or visual hallucinations.        Mood and Affect: Mood normal.        Speech: Speech normal.        Behavior: Behavior normal. Behavior is cooperative.        Thought Content: Thought content normal. Thought content is not paranoid or delusional. Thought content does not include homicidal or suicidal ideation. Thought content  does not include homicidal or suicidal plan.        Cognition and Memory: Cognition normal.    Review of Systems  Constitutional: Negative for fever.  HENT: Negative for congestion, sinus pain and sore throat.   Respiratory: Negative for cough and shortness of breath.   Cardiovascular: Negative.   Gastrointestinal: Negative.   Genitourinary: Negative.   Musculoskeletal: Negative.   Neurological: Negative.    Blood pressure 116/74, pulse (!) 106, temperature 98.2 F (36.8 C), temperature source Oral, resp. rate 16, height  (1.575 m), weight 93.9 kg, last menstrual period 04/07/2021, SpO2 100 %. Body mass index is 37.86 kg/m.      Has this patient used any form of tobacco in the last 30 days? (Cigarettes, Smokeless Tobacco, Cigars, and/or Pipes) Yes, N/A  Blood Alcohol level:  Lab Results  Component Value Date   ETH <10 04/24/2021    Metabolic Disorder Labs:  Lab Results  Component Value Date   HGBA1C 5.8 (H) 04/29/2021   MPG 120 04/29/2021   No results found for: PROLACTIN Lab Results  Component Value Date   CHOL 168 05/02/2021   TRIG 41 05/02/2021   HDL 66 05/02/2021   CHOLHDL 2.5 05/02/2021   VLDL 8 05/02/2021   LDLCALC 94 05/02/2021   LDLCALC 92 04/29/2021    See Psychiatric Specialty Exam and Suicide Risk Assessment completed by Attending Physician prior to discharge.  Discharge destination:  Home  Is patient on multiple antipsychotic therapies at discharge:  No   Has Patient had three or more failed trials of antipsychotic monotherapy by history:  No  Recommended Plan for Multiple Antipsychotic Therapies: NA   Allergies as of 05/05/2021   No Known Allergies     Medication List    STOP taking these medications   acetaminophen 500 MG tablet Commonly known as: TYLENOL   amoxicillin-clavulanate 875-125 MG tablet Commonly known as: AUGMENTIN     TAKE these medications     Indication  benztropine 0.5 MG tablet Commonly known as:  COGENTIN Take 1 tablet (0.5 mg total) by mouth 2 (two) times daily as needed for tremors (eps).  Indication: Extrapyramidal Reaction caused by Medications   escitalopram 20 MG tablet Commonly known as: LEXAPRO Take 1 tablet (20 mg total) by mouth daily. Start taking on: May 06, 2021  Indication: Major Depressive Disorder   ferrous sulfate 325 (65 FE) MG tablet Take 325 mg by mouth daily. What changed: Another medication with the same name was removed. Continue taking this medication, and follow the directions you see here.  Indication: Anemia From Inadequate Iron in the Body   LORazepam 0.5 MG tablet Commonly known as: ATIVAN Take 1 tablet (0.5 mg total) by mouth 3 (three) times daily.  Indication: Feeling Anxious   risperiDONE 4  MG disintegrating tablet Commonly known as: RISPERDAL M-TABS Take 1 tablet (4 mg total) by mouth at bedtime.  Indication: Schizophrenia   risperiDONE 2 MG disintegrating tablet Commonly known as: RISPERDAL M-TABS Take 1 tablet (2 mg total) by mouth daily. Start taking on: May 06, 2021  Indication: Schizophrenia   traZODone 100 MG tablet Commonly known as: DESYREL Take 1 tablet (100 mg total) by mouth at bedtime.  Indication: Trouble Sleeping       Follow-up Information    [facility]. Go on 05/16/2021.   Specialty: Behavioral Health Why: Patient has an already-established meeting on Friday 6/17 with psychiatrist.  Please walk in Monday through Wednesday between 7:45am-11am to be seen any sooner. Contact information: [address] [place] [address] [phone]              Follow-up recommendations:  Activity:  as tolerated Diet:  heart healthy  Comments:  Paper prescriptions were given at discharge.  Patient is agreeable with the discharge plan.  She was given an opportunity to ask questions.  She appears to feel comfortable with discharge and denies any current suicidal or homicidal thoughts.    Patient is instructed prior to discharge to: Take all medications as prescribed by her mental healthcare provider. Report any adverse effects and or reactions from the medicines to her outpatient provider promptly. Patient has been instructed & cautioned: To not engage in alcohol and or illegal drug use while on prescription medicines. In the event of worsening symptoms, patient is instructed to call the crisis hotline, 911 and or go to the nearest ED for appropriate evaluation and treatment of symptoms. To follow-up with her primary care provider for your other medical issues, concerns and or health care needs.   Signed: Laveda Abbe, NP 05/05/2021, 12:19 PM

## 2021-05-05 NOTE — Progress Notes (Signed)
  [facility] Adult Case Management Discharge Plan :  Will you be returning to the same living situation after discharge:  Yes,  home with mother At discharge, do you have transportation home?: Yes,  mother to pick this patient up Do you have the ability to pay for your medications: No. Samples to be provided at discharge   Release of information consent forms completed and in the chart;  Patient's signature needed at discharge.  Patient to Follow up at:  Follow-up Information    [facility]. Go on 05/16/2021.   Specialty: Behavioral Health Why: Patient has an already-established meeting on Friday 6/17 with psychiatrist.  Please walk in Monday through Wednesday between 7:45am-11am to be seen any sooner. Contact information: [address] [place] [address] [phone]              Next level of care provider has access to [employer] Link:yes  Safety Planning and Suicide Prevention discussed: Yes,  with mother     Has patient been referred to the Quitline?: N/A patient is not a smoker  Patient has been referred for addiction treatment: N/A  Otelia Santee, LCSW 05/05/2021, 9:26 AM

## 2021-05-05 NOTE — Progress Notes (Signed)
Discharge Note:  Patient discharged home with mother.  Suicide prevention given and discussed with patient who stated she understood and had no questions.  Patient denied SI and HI.  Denied A/V hallucinations.  Patient stated she received all her belongings, clothing, toiletries, etc.  Patient stated she appreciated all assistance received from [facility] staff.  All required discharge information given.

## 2021-05-05 NOTE — BHH Suicide Risk Assessment (Signed)
[facility] Discharge Suicide Risk Assessment   Principal Problem: MDD (major depressive disorder), single episode with catatonic features Discharge Diagnoses: Principal Problem:   MDD (major depressive disorder), single episode with catatonic features   Total Time spent with patient: 15 minutes  Musculoskeletal: Strength & Muscle Tone: within normal limits Gait & Station: normal Patient leans: N/A  Psychiatric Specialty Exam  Presentation  General Appearance: Fairly Groomed  Eye Contact:Fair  Speech:Normal Rate  Speech Volume:Decreased  Handedness:Right   Mood and Affect  Mood:Anxious  Duration of Depression Symptoms: Greater than two weeks  Affect:Congruent   Thought Process  Thought Processes:Goal Directed  Descriptions of Associations:Circumstantial  Orientation:Full (Time, Place and Person)  Thought Content:Logical  History of Schizophrenia/Schizoaffective disorder:No  Duration of Psychotic Symptoms:Less than six months  Hallucinations:Hallucinations: None  Ideas of Reference:None  Suicidal Thoughts:Suicidal Thoughts: No  Homicidal Thoughts:Homicidal Thoughts: No   Sensorium  Memory:Immediate Fair; Recent Fair; Remote Fair  Judgment:Fair  Insight:Fair   Executive Functions  Concentration:Fair  Attention Span:Fair  Recall:Fair  Fund of Knowledge:Fair  Language:Fair   Psychomotor Activity  Psychomotor Activity:Psychomotor Activity: Decreased   Assets  Assets:Housing; Desire for Improvement; Resilience; Social Support   Sleep  Sleep:Sleep: Good Number of Hours of Sleep: 6   Physical Exam: Physical Exam Vitals and nursing note reviewed.  Constitutional:      Appearance: Normal appearance.  HENT:     Head: Normocephalic and atraumatic.  Pulmonary:     Effort: Pulmonary effort is normal.  Neurological:     General: No focal deficit present.     Mental Status: She is alert and oriented to person, place, and time.     Review of Systems  All other systems reviewed and are negative.  Blood pressure 116/74, pulse (!) 106, temperature 98.2 F (36.8 C), temperature source Oral, resp. rate 16, height 5\' 2"  (1.575 m), weight 93.9 kg, last menstrual period 04/07/2021, SpO2 100 %. Body mass index is 37.86 kg/m.  Mental Status Per Nursing Assessment::   On Admission:  NA  Demographic Factors:  Low socioeconomic status  Loss Factors: Decrease in vocational status  Historical Factors: Impulsivity  Risk Reduction Factors:   Sense of responsibility to family, Living with another person, especially a relative and Positive social support  Continued Clinical Symptoms:  Depression:   Impulsivity  Cognitive Features That Contribute To Risk:  None    Suicide Risk:  Minimal: No identifiable suicidal ideation.  Patients presenting with no risk factors but with morbid ruminations; may be classified as minimal risk based on the severity of the depressive symptoms   Follow-up Information    [facility]. Go on 05/16/2021.   Specialty: Behavioral Health Why: Patient has an already-established meeting on Friday 6/17 with psychiatrist.  Please walk in Monday through Wednesday between 7:45am-11am to be seen any sooner. Contact information: [address] [place] [phone]              Plan Of Care/Follow-up recommendations:  Activity:  ad lib  [phone], MD 05/05/2021, 8:05 AM

## 2021-05-05 NOTE — Plan of Care (Signed)
Nurse discussed coping skills with patient.  

## 2021-05-05 NOTE — Progress Notes (Signed)
Recreation Therapy Notes  Date:  6.6.22 Time: 0930 Location: [address] Day[address]  Group Topic: Stress Management  Goal Area(s) Addresses:  Patient will identify positive stress management techniques. Patient will identify benefits of using stress management post d/c.  Behavioral Response: Attentive  Intervention: Stress Management  Activity: Meditation.  LRT played a meditation that focused on setting boundaries and that it's ok to say no for your own self care even if it's not what others want.    Education:  Stress Management, Discharge Planning.   Education Outcome: Acknowledges Education  Clinical Observations/Feedback: Pt attended and participated.  Pt had no questions or concerns.    Caroll Rancher, LRT/CTRS         Caroll Rancher A 05/05/2021 12:21 PM

## 2021-05-14 ENCOUNTER — Other Ambulatory Visit: Payer: Self-pay

## 2021-05-14 ENCOUNTER — Ambulatory Visit (INDEPENDENT_AMBULATORY_CARE_PROVIDER_SITE_OTHER): Payer: No Payment, Other | Admitting: Physician Assistant

## 2021-05-14 VITALS — BP 127/78 | HR 68 | Ht 65.0 in | Wt 228.0 lb

## 2021-05-14 DIAGNOSIS — F331 Major depressive disorder, recurrent, moderate: Secondary | ICD-10-CM

## 2021-05-14 DIAGNOSIS — F411 Generalized anxiety disorder: Secondary | ICD-10-CM | POA: Diagnosis not present

## 2021-05-14 DIAGNOSIS — G4700 Insomnia, unspecified: Secondary | ICD-10-CM | POA: Diagnosis not present

## 2021-05-14 NOTE — Progress Notes (Signed)
Psychiatric Initial Adult Assessment   Patient Identification: Cheryl Adams MRN:  [id number] Date of Evaluation:  05/17/2021 Referral Source[facility] Chief Complaint:   Chief Complaint   Medication Management    Visit Diagnosis:    ICD-10-CM   1. Insomnia, unspecified type  G47.00     2. Major depressive disorder, recurrent episode, moderate with anxious distress (HCC)  F33.1     3. Anxiety state  F41.1       History of Present Illness:    Cheryl Adams is a 30 year old female with a past psychiatric history significant for insomnia who [facility] for medication management.  When asked the reason for today's encounter, patient replied "I haven't been myself lately."  Patient was also asked if she had any past psychiatric illnesses to which the patient denied past psychiatric history.  Patient also denied past hospitalization due to mental health.  Patient states that she was depressed for a short period of time but she is doing good now.  Patient denies any major issues or concerns regarding her mental health and states that she feels normal.  Patient denies anxiety.  When patient was asked if there was a major event that encouraged her to be seen today, patient replied that she had been working a lot a while back.  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  Patient expresses that there was no physical encounter between her and her manager and states that it was more of an emotional connection.  During that incident, patient states that she was working a lot between her 2 jobs that consisted of [employer] and [employer].  Patient expressed that she would feel tired but would still continue to work her 2 jobs.  She reports that after a while, her sleep was affected and she started experiencing difficulty sleeping.  Patient further  endorsed the following symptoms: nervousness, fatigue, lack of energy, low mood, and lack of motivation.  Patient states that [facility] and when she was discharged, she was placed on the following medications:  Benztropine 0.5 mg 2 times daily Lorazepam 0.5 mg 3 times daily as needed Risperidone 4 mg at bedtime Risperidone 2 mg daily Trazodone 100 mg at bedtime Escitalopram 20 mg daily  Per chart review, [facility], accompanied by her mother.  On arrival to the ED, patient was nonverbal and only responded to commands by nodding yes or no.  Per patient's mother, she states that the patient had a traumatic event the previous year.  Patient's mother reported that the patient had not been eating well, not leaving the home, and required assistance with her activities of daily living.  Patient's mother also reported that the patient experienced screaming episodes.   [facility], patient was alert and oriented to person and place. Patient was unable to recall the date. Patient stated that on day she went to the ED, she was sleeping and woke up and saw a figure.  She later stated that she did not actually see a figure but felt like someone was there, however, no one was. Patient endorsed that she had been depressed for approximately a year and reported feeling sad most days of the week.  Patient endorsed the following symptoms: low mood, insomnia, feelings of guilt/worthlessness, problems with energy,  difficulty concentrating, anxiety, irritability, and decreased appetite.  Patient denied auditory hallucinations, however, it was noted that she reported auditory hallucinations while in the ED but was unable to describe what she heard.  Patient reported visual hallucinations of "nature and animals."  It was noted that patient giggled a couple of times during the assessment as she was looking away from the provider; patient was  unable to state why.  Patient was discharged from [facility] on 05/05/2021 with a discharge diagnosis of major depressive disorder, single episode with catatonic features.  Patient is pleasant, calm, cooperative, and fully engaged in conversation during the encounter.  Patient denies suicidal or homicidal ideations and denies a past history of suicide attempt.  She further denies auditory or visual hallucinations and denies experiencing any past episodes of hallucinations.  Patient does not appear to be responding to internal/external stimuli.  Patient endorses good sleep and receives on average 8 hours of sleep each night.  Patient endorses good appetite and eats on average 3 meals a day with some snacking in between.  Patient denies alcohol consumption, tobacco use, and illicit drug use.  Patient states that she has been taking her medications as prescribed and has not experienced any issues.  Associated Signs/Symptoms: Depression Symptoms:  increased appetite, (Hypo) Manic Symptoms:   None Anxiety Symptoms:   None Psychotic Symptoms:   None PTSD Symptoms: Had a traumatic exposure:  Patient denies traumatic experiences Had a traumatic exposure in the last month:  N/A Re-experiencing:  None Hypervigilance:  No Hyperarousal:  None Avoidance:  None  Past Psychiatric History:  Depression Anxiety  Previous Psychotropic Medications: Yes   Substance Abuse History in the last 12 months:  No.  Consequences of Substance Abuse: Negative  Past Medical History:  Past Medical History:  Diagnosis Date   Anemia    Chronic tonsillitis 10/2014   Cough 11/09/2014   Difficulty swallowing pills     Past Surgical History:  Procedure Laterality Date   TONSILLECTOMY AND ADENOIDECTOMY Bilateral 11/14/2014   Procedure: BILATERAL TONSILLECTOMY AND ADENOIDECTOMY;  Surgeon: Flo Shanks, MD;  Location: [facility];  Service: ENT;  Laterality: Bilateral;   TYMPANOSTOMY TUBE PLACEMENT      Family  Psychiatric History:  Patient is unsure of psychiatric history  Family History: History reviewed. No pertinent family history.  Social History:   Social History   Socioeconomic History   Marital status: Single    Spouse name: Not on file   Number of children: Not on file   Years of education: Not on file   Highest education level: Not on file  Occupational History   Not on file  Tobacco Use   Smoking status: Never   Smokeless tobacco: Never  Substance and Sexual Activity   Alcohol use: No   Drug use: No   Sexual activity: Not on file  Other Topics Concern   Not on file  Social History Narrative   Not on file   Social Determinants of Health   Financial Resource Strain: Not on file  Food Insecurity: Not on file  Transportation Needs: Not on file  Physical Activity: Not on file  Stress: Not on file  Social Connections: Not on file    Additional Social History:  Patient is currently taking a break from working. During her spare time, patient enjoys listening to music and watching tv.  Allergies:  No Known Allergies  Metabolic Disorder Labs: Lab Results  Component Value Date   HGBA1C 5.8 (H) 04/29/2021  MPG 120 04/29/2021   No results found for: PROLACTIN Lab Results  Component Value Date   CHOL 168 05/02/2021   TRIG 41 05/02/2021   HDL 66 05/02/2021   CHOLHDL 2.5 05/02/2021   VLDL 8 05/02/2021   LDLCALC 94 05/02/2021   LDLCALC 92 04/29/2021   Lab Results  Component Value Date   TSH 1.052 04/29/2021    Therapeutic Level Labs: No results found for: LITHIUM No results found for: CBMZ No results found for: VALPROATE  Current Medications: Current Outpatient Medications  Medication Sig Dispense Refill   benztropine (COGENTIN) 0.5 MG tablet Take 1 tablet (0.5 mg total) by mouth 2 (two) times daily as needed for tremors (eps). 20 tablet 0   escitalopram (LEXAPRO) 20 MG tablet Take 1 tablet (20 mg total) by mouth daily. 30 tablet 0   ferrous sulfate 325  (65 FE) MG tablet Take 325 mg by mouth daily.     LORazepam (ATIVAN) 0.5 MG tablet Take 1 tablet (0.5 mg total) by mouth 3 (three) times daily. 21 tablet 0   risperiDONE (RISPERDAL M-TABS) 2 MG disintegrating tablet Take 1 tablet (2 mg total) by mouth daily. 30 tablet 0   risperiDONE (RISPERDAL M-TABS) 4 MG disintegrating tablet Take 1 tablet (4 mg total) by mouth at bedtime. 30 tablet 0   traZODone (DESYREL) 100 MG tablet Take 1 tablet (100 mg total) by mouth at bedtime. 30 tablet 0   No current facility-administered medications for this visit.    Musculoskeletal: Strength & Muscle Tone: within normal limits Gait & Station: normal Patient leans: N/A  Psychiatric Specialty Exam: Review of Systems  Psychiatric/Behavioral:  Negative for agitation, decreased concentration, dysphoric mood, hallucinations, self-injury, sleep disturbance and suicidal ideas. The patient is not nervous/anxious and is not hyperactive.    Blood pressure 127/78, pulse 68, height 5\' 5"  (1.651 m), weight 228 lb (103.4 kg).Body mass index is 37.94 kg/m.  General Appearance: Well Groomed  Eye Contact:  Good  Speech:  Clear and Coherent and Normal Rate  Volume:  Normal  Mood:  Euthymic  Affect:  Appropriate  Thought Process:  Coherent and Descriptions of Associations: Intact  Orientation:  Full (Time, Place, and Person)  Thought Content:  WDL  Suicidal Thoughts:  No  Homicidal Thoughts:  No  Memory:  Immediate;   Good Recent;   Good Remote;   Fair  Judgement:  Good  Insight:  Good  Psychomotor Activity:  Normal  Concentration:  Concentration: Good and Attention Span: Good  Recall:  Good  Fund of Knowledge:Fair  Language: Good  Akathisia:  NA  Handed:  Right  AIMS (if indicated):  not done  Assets:  Communication Skills Desire for Improvement Housing Social Support  ADL's:  Intact  Cognition: WNL  Sleep:  Good   Screenings: AIMS    Flowsheet Row [facility]  AIMS Total Score 0      GAD-7    Flowsheet Row Office Visit from 05/14/2021 in [facility] Counselor from 04/02/2021 in [facility]  Total GAD-7 Score 0 17      PHQ2-9    Flowsheet Row Office Visit from 05/14/2021 in [facility] Counselor from 04/02/2021 in [facility] Office Visit from 02/07/2020 in [facility] Office Visit from 12/05/2019 in [facility] Office Visit from 12/30/2018 in [facility]  PHQ-2 Total Score 0 4 0 0 1  PHQ-9 Total Score -- 20 0 0 5      Flowsheet Row Office Visit from 05/14/2021 in [facility] Admission (Discharged) from 04/28/2021 in [facility] ED from 04/24/2021 in [facility] EMERGENCY DEPARTMENT  C-SSRS RISK CATEGORY No Risk No Risk No Risk       Assessment and Plan:   Aysha Livecchi is a 30 year old female with a past psychiatric history significant for insomnia who presents to [facility] for medication management.  Patient was admitted to [facility] on 04/28/2021 due to major depressive disorder, single episode of catatonic features.  Patient was discharged on 05/05/2021 and placed on the following medications:  Benztropine 0.5 mg 2 times daily Lorazepam 0.5 mg 3 times daily as needed Risperidone 4 mg at bedtime Risperidone 2 mg daily Trazodone 100 mg at bedtime Escitalopram 20 mg daily  Patient reports that she has been taking her medications as prescribed and has been experiencing no issues or concerns regarding her mental health.  Patient denies depressive symptoms or anxiety and states that she feels normal.  Patient denies refills on her medications.  Patient states that she is currently not  working.  1. Insomnia, unspecified type Patient to continue taking trazodone 100 mg at bedtime for the management of her insomnia  2. Major depressive disorder, recurrent episode, moderate with anxious distress (HCC) Patient to continue taking escitalopram 20 mg daily for the management of her major depressive disorder Patient to continue taking risperidone 4 mg at bedtime  Patient to continue taking risperidone 2 mg daily Patient to continue taking benztropine 0.5 mg 2 times daily  3. Anxiety state Patient to continue taking escitalopram 20 mg daily for the management of her anxiety Patient to continue taking lorazepam 0.5 mg 3 times daily as needed for the management of her anxiety  Patient to follow-up in 6 weeks Provider spent a total of 60 minutes with the patient/reviewing chart  Meta Hatchet, PA 6/18/20228:16 PM

## 2021-05-15 ENCOUNTER — Encounter (HOSPITAL_COMMUNITY): Payer: Self-pay | Admitting: Physician Assistant

## 2021-05-17 DIAGNOSIS — F331 Major depressive disorder, recurrent, moderate: Secondary | ICD-10-CM | POA: Insufficient documentation

## 2021-05-17 DIAGNOSIS — G4700 Insomnia, unspecified: Secondary | ICD-10-CM | POA: Insufficient documentation

## 2021-05-17 DIAGNOSIS — F411 Generalized anxiety disorder: Secondary | ICD-10-CM | POA: Insufficient documentation

## 2021-05-20 ENCOUNTER — Ambulatory Visit (INDEPENDENT_AMBULATORY_CARE_PROVIDER_SITE_OTHER): Payer: No Payment, Other | Admitting: Clinical

## 2021-05-20 ENCOUNTER — Other Ambulatory Visit: Payer: Self-pay

## 2021-05-20 DIAGNOSIS — F331 Major depressive disorder, recurrent, moderate: Secondary | ICD-10-CM | POA: Diagnosis not present

## 2021-05-24 NOTE — Progress Notes (Signed)
   THERAPIST PROGRESS NOTE  Session Time: 30 minutes  Participation Level: Active  Behavioral Response: CasualAlertEuthymic  Type of Therapy: Individual Therapy  Treatment Goals addressed: Coping  Interventions: CBT and Supportive  Summary:  Cheryl Adams is a 30 y.o. female who presents for the scheduled session oriented times five, appropriately dressed, and friendly. Client denied hallucinations and delusions. Client reported on today she is doing much better. Client reported since she was last seen she went to the hospital because she wasn't getting enough rest and it affected her behaviors. Client reported since starting her medication regimen she has noticed and positive difference as well as her mother. Client reported prior to treatment she was overworking herself, feeling stressed and tired all the time. Client reported she continues to take time off from work until she feels like she is ready. Client reported her appetite is good and she is socializing more.      Suicidal/Homicidal: Nowithout intent/plan  Therapist Response:  Therapist began the session asking how she has been doing. Therapist actively listened and provided positive emotional support. Therapist used CBT to ask client about medication compliance and reaction to it. Therapist used CBT to engage with the client to discuss good self care practices to maintain improvement. Therapist assigned the client homework to practice sleep schedule and engaging in activities in and outside of the home that elevate her mood. Client was scheduled for next appointment.     Plan: Return again in 5 weeks.  Diagnosis: Major depressive disorder, recurrent episode, moderate w/ anxious distress  Neena Rhymes Marjoria Mancillas, LCSW 05/20/2021

## 2021-06-03 ENCOUNTER — Encounter: Payer: Self-pay | Admitting: *Deleted

## 2021-06-05 ENCOUNTER — Ambulatory Visit (INDEPENDENT_AMBULATORY_CARE_PROVIDER_SITE_OTHER): Payer: No Payment, Other | Admitting: Clinical

## 2021-06-05 ENCOUNTER — Other Ambulatory Visit: Payer: Self-pay

## 2021-06-05 DIAGNOSIS — F331 Major depressive disorder, recurrent, moderate: Secondary | ICD-10-CM

## 2021-06-05 NOTE — Progress Notes (Signed)
   THERAPIST PROGRESS NOTE  Session Time: 30 minutes  Participation Level: Active  Behavioral Response: CasualAlertEuthymic  Type of Therapy: Individual Therapy  Treatment Goals addressed: Coping  Interventions: CBT and Supportive  Summary:  Cheryl Adams is a 30 y.o. female who presents for the scheduled session oriented x5, appropriately dressed, and friendly.  Denied hallucinations and delusions. Client reported on today she continues to do very well.  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 Client reports she is being mindful of balance between her work and personal time.  Client reported she has seen and made improvements to intentionally began really engaging with family and friends.  Client reported she is also feeling more comfortable being in social settings. Client stated "I am excited". Client reported she had family and friends that were checking on her but she is working on being more intentional about keeping in contact with them.  Client reported there to before her "mental break" social interaction did not seem important to her.  Client reported she was so accustomed to feeling stressed and overworking herself until it caught up with her physically and mentally.  Client reported upon her reflection prior to treatment currently she noticed having a hard time staying "no".  Client reported she did not feel presents in her day to day activities.  I reported now she makes sure that she falls asleep between 11 PM and 12 AM. Client reported she wakes up around 8 AM.  Client reported overall she is responding very well to the medication her anxiety has decreased per her report and she feels more motivated to take better care of herself.  Suicidal/Homicidal: Nowithout intent/plan  Therapist Response:  Therapist began the session by asking the client how she has been doing since the last  appointment. Therapist used CBT to utilize eye contact and positive emotional support towards the client's thoughts and feelings. Therapist used CBT to engage with the client and ask open-ended questions about medication compliance compared to her current symptoms. Therapist used CBT to engage with client to discuss her use of coping skills and behavioral activation methods to improve her daily functioning and thought processing. Therapist used CBT to engage with client to acknowledge her positives since she has begun treatment. Therapist assigned the client homework to continue good sleep hygiene, boundaries, and social interaction. Client was scheduled for next appointment.     Plan: Return again in 4 weeks.  Diagnosis: Major depressive disorder, recurrent episode, moderate w/ anxious distress    Neena Rhymes Toma Arts, LCSW 06/05/2021

## 2021-06-26 ENCOUNTER — Ambulatory Visit (INDEPENDENT_AMBULATORY_CARE_PROVIDER_SITE_OTHER): Payer: 59 | Admitting: Clinical

## 2021-06-26 ENCOUNTER — Telehealth (INDEPENDENT_AMBULATORY_CARE_PROVIDER_SITE_OTHER): Payer: 59 | Admitting: Physician Assistant

## 2021-06-26 ENCOUNTER — Other Ambulatory Visit: Payer: Self-pay

## 2021-06-26 DIAGNOSIS — F331 Major depressive disorder, recurrent, moderate: Secondary | ICD-10-CM

## 2021-06-26 DIAGNOSIS — G4700 Insomnia, unspecified: Secondary | ICD-10-CM | POA: Diagnosis not present

## 2021-06-26 DIAGNOSIS — F329 Major depressive disorder, single episode, unspecified: Secondary | ICD-10-CM

## 2021-06-26 DIAGNOSIS — F061 Catatonic disorder due to known physiological condition: Secondary | ICD-10-CM

## 2021-06-26 DIAGNOSIS — F411 Generalized anxiety disorder: Secondary | ICD-10-CM

## 2021-06-26 MED ORDER — ESCITALOPRAM OXALATE 20 MG PO TABS: 20.0000 mg | ORAL_TABLET | Freq: Every day | ORAL | 1 refills | Status: DC

## 2021-06-26 MED ORDER — LORAZEPAM 0.5 MG PO TABS: 0.5000 mg | ORAL_TABLET | Freq: Three times a day (TID) | ORAL | 0 refills | Status: DC

## 2021-06-26 MED ORDER — RISPERIDONE 4 MG PO TBDP: 4.0000 mg | ORAL_TABLET | Freq: Every day | ORAL | 0 refills | Status: DC

## 2021-06-26 MED ORDER — RISPERIDONE 4 MG PO TBDP: 4.0000 mg | ORAL_TABLET | Freq: Every day | ORAL | 1 refills | Status: DC

## 2021-06-26 MED ORDER — BENZTROPINE MESYLATE 0.5 MG PO TABS: 0.5000 mg | ORAL_TABLET | Freq: Two times a day (BID) | ORAL | 0 refills | Status: DC | PRN

## 2021-06-26 MED ORDER — RISPERIDONE 2 MG PO TBDP: 2.0000 mg | ORAL_TABLET | Freq: Every day | ORAL | 0 refills | Status: DC

## 2021-06-26 MED ORDER — TRAZODONE HCL 100 MG PO TABS: 100.0000 mg | ORAL_TABLET | Freq: Every day | ORAL | 1 refills | Status: DC

## 2021-06-26 MED ORDER — ESCITALOPRAM OXALATE 20 MG PO TABS: 20.0000 mg | ORAL_TABLET | Freq: Every day | ORAL | 0 refills | Status: DC

## 2021-06-26 MED ORDER — BENZTROPINE MESYLATE 0.5 MG PO TABS: 0.5000 mg | ORAL_TABLET | Freq: Two times a day (BID) | ORAL | 1 refills | Status: DC | PRN

## 2021-06-26 MED ORDER — RISPERIDONE 2 MG PO TBDP: 2.0000 mg | ORAL_TABLET | Freq: Every day | ORAL | 1 refills | Status: DC

## 2021-06-26 MED ORDER — TRAZODONE HCL 100 MG PO TABS: 100.0000 mg | ORAL_TABLET | Freq: Every day | ORAL | 0 refills | Status: DC

## 2021-06-26 NOTE — Progress Notes (Signed)
   THERAPIST PROGRESS NOTE  Session Time: 20 minutes  Participation Level: Active  Behavioral Response: CasualAlertEuthymic  Type of Therapy: Individual Therapy  Treatment Goals addressed: Coping  Interventions: CBT and Supportive  Summary:  Cheryl Adams is a 30 y.o. female who presents for the scheduled session oriented x5, appropriately dressed, and friendly.  Client denied hallucinations and delusions. Client reported on today she is doing well.  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  Client reported she has now decided to quit that job.  Client reported she felt like the position did not suit her.  Client reported she did not have a negative experience by things that she wants to explore something else that she would enjoy doing.  Client reported this time around she is noticing how to handle things differently in a way that suits her positively.  Client reported she wants to find something that is possibly at a slower place or something in retail where she can interact with people and be busy on her feet.  Client stated "I am learning to find my way".  Client reported she has been doing a lot of self reflection in a positive way to enjoy having free time to take care of herself.  Client reported she is continuing to slowly integrate back into certain social interactions as she navigates boundaries that she is comfortable with.  Client reported she has been practicing meditation and breathing exercises.  Client reported she feels a lot happier feeling that she is more in control of decisions that she would like to make.  Client reported she met with the doctor this morning and her current medication regimen has been successful.      Suicidal/Homicidal: Nowithout intent/plan  Therapist Response:  Therapist began the session by asking the client how she has been doing since last seen. Therapist used CBT to use  active listening and positive emotional support towards her thoughts and feelings. Therapist used CBT to ask the client open-ended questions about changes in her symptoms compared to her medication compliance. Therapist used CBT to engage with client and ask open-ended questions about how she is applying new thought processing and boundaries to help make better decisions that improve her mental health. Therapist assigned the client homework to continue reinforcing boundaries and practicing balance between self-care and work. Client was scheduled for next appointment.  Plan: Return again in 4 weeks.  Diagnosis: MDD, single episode, without catatonic features   Birdena Jubilee Melat Wrisley, LCSW 06/26/2021

## 2021-06-28 ENCOUNTER — Encounter (HOSPITAL_COMMUNITY): Payer: Self-pay | Admitting: Physician Assistant

## 2021-06-28 NOTE — Progress Notes (Signed)
BH MD/PA/NP OP Progress Note  Virtual Visit via Telephone Note  I connected with SKYLEY GRANDMAISON on 06/26/21 at  8:30 AM EDT by telephone and verified that I am speaking with the correct person using two identifiers.  Location: Patient: Home Provider: Clinic   I discussed the limitations, risks, security and privacy concerns of performing an evaluation and management service by telephone and the availability of in person appointments. I also discussed with the patient that there may be a patient responsible charge related to this service. The patient expressed understanding and agreed to proceed.  Follow Up Instructions:   I discussed the assessment and treatment plan with the patient. The patient was provided an opportunity to ask questions and all were answered. The patient agreed with the plan and demonstrated an understanding of the instructions.   The patient was advised to call back or seek an in-person evaluation if the symptoms worsen or if the condition fails to improve as anticipated.  I provided 20 minutes of non-face-to-face time during this encounter.  Meta Hatchet, PA    06/26/2021 2:17 PM KANIA REGNIER  MRN:  [id number]  Chief Complaint: Follow up and medication management  HPI:   Kaida Games is a 30 year old female with a past psychiatric history significant for major depressive disorder and anxiety who presents to [facility] via virtual telephone visit for follow-up and medication management.  Patient is currently being managed on the following medications:  Benztropine 0.5 mg 2 times daily for Lorazepam 0.5 mg 3 times daily as needed for Risperidone 10 mg daily Trazodone 100 mg at bedtime Escitalopram 20 mg daily  Patient reports no issues or concerns regarding her current medication regimen.  Patient denies the need for dosage adjustments at this time.  Patient denies depressive symptoms or anxiety.  Patient rates her  anxiety a 0 out of 10.  Patient denies any new stressors or major life changing events.  Patient states that she has been taking time out for herself and has been doing okay.  Patient is alert and oriented x4, calm, cooperative, and fully engaged in conversation during the encounter.  Patient reports that she is pretty calm and cool.  Patient denies suicidal or homicidal ideations.  She further denies auditory or visual hallucinations and does not appear to be responding to internal/external stimuli.  Patient endorses good sleep and receives on average 8 hours of sleep each night.  Patient endorses good appetite and eats on average 3 meals per day.  Patient denies alcohol consumption, tobacco use, and illicit drug use.  Visit Diagnosis:    ICD-10-CM   1. Major depressive disorder, recurrent episode, moderate with anxious distress (HCC)  F33.1 benztropine (COGENTIN) 0.5 MG tablet    escitalopram (LEXAPRO) 20 MG tablet    risperiDONE (RISPERDAL M-TABS) 2 MG disintegrating tablet    risperiDONE (RISPERDAL M-TABS) 4 MG disintegrating tablet    DISCONTINUED: benztropine (COGENTIN) 0.5 MG tablet    DISCONTINUED: risperiDONE (RISPERDAL M-TABS) 2 MG disintegrating tablet    DISCONTINUED: escitalopram (LEXAPRO) 20 MG tablet    DISCONTINUED: risperiDONE (RISPERDAL M-TABS) 4 MG disintegrating tablet    2. Anxiety state  F41.1 escitalopram (LEXAPRO) 20 MG tablet    LORazepam (ATIVAN) 0.5 MG tablet    DISCONTINUED: LORazepam (ATIVAN) 0.5 MG tablet    DISCONTINUED: escitalopram (LEXAPRO) 20 MG tablet    DISCONTINUED: LORazepam (ATIVAN) 0.5 MG tablet    3. Insomnia, unspecified type  G47.00 traZODone (DESYREL)  100 MG tablet    DISCONTINUED: traZODone (DESYREL) 100 MG tablet      Past Psychiatric History:  Major depressive disorder Anxiety Insomnia  Past Medical History:  Past Medical History:  Diagnosis Date   Anemia    Chronic tonsillitis 10/2014   Cough 11/09/2014   Difficulty swallowing  pills     Past Surgical History:  Procedure Laterality Date   TONSILLECTOMY AND ADENOIDECTOMY Bilateral 11/14/2014   Procedure: BILATERAL TONSILLECTOMY AND ADENOIDECTOMY;  Surgeon: Flo Shanks, MD;  Location: [facility];  Service: ENT;  Laterality: Bilateral;   TYMPANOSTOMY TUBE PLACEMENT      Family Psychiatric History:  Patient is unsure of psychiatric history  Family History: No family history on file.  Social History:  Social History   Socioeconomic History   Marital status: Single    Spouse name: Not on file   Number of children: Not on file   Years of education: Not on file   Highest education level: Not on file  Occupational History   Not on file  Tobacco Use   Smoking status: Never   Smokeless tobacco: Never  Substance and Sexual Activity   Alcohol use: No   Drug use: No   Sexual activity: Not on file  Other Topics Concern   Not on file  Social History Narrative   Not on file   Social Determinants of Health   Financial Resource Strain: Not on file  Food Insecurity: Not on file  Transportation Needs: Not on file  Physical Activity: Not on file  Stress: Not on file  Social Connections: Not on file    Allergies: No Known Allergies  Metabolic Disorder Labs: Lab Results  Component Value Date   HGBA1C 5.8 (H) 04/29/2021   MPG 120 04/29/2021   No results found for: PROLACTIN Lab Results  Component Value Date   CHOL 168 05/02/2021   TRIG 41 05/02/2021   HDL 66 05/02/2021   CHOLHDL 2.5 05/02/2021   VLDL 8 05/02/2021   LDLCALC 94 05/02/2021   LDLCALC 92 04/29/2021   Lab Results  Component Value Date   TSH 1.052 04/29/2021   TSH 1.352 02/26/2015    Therapeutic Level Labs: No results found for: LITHIUM No results found for: VALPROATE No components found for:  CBMZ  Current Medications: Current Outpatient Medications  Medication Sig Dispense Refill   benztropine (COGENTIN) 0.5 MG tablet Take 1 tablet (0.5 mg total) by mouth 2  (two) times daily as needed for tremors (eps). 60 tablet 1   escitalopram (LEXAPRO) 20 MG tablet Take 1 tablet (20 mg total) by mouth daily. 30 tablet 1   ferrous sulfate 325 (65 FE) MG tablet Take 325 mg by mouth daily.     LORazepam (ATIVAN) 0.5 MG tablet Take 1 tablet (0.5 mg total) by mouth 3 (three) times daily. 21 tablet 0   risperiDONE (RISPERDAL M-TABS) 2 MG disintegrating tablet Take 1 tablet (2 mg total) by mouth daily. 30 tablet 1   risperiDONE (RISPERDAL M-TABS) 4 MG disintegrating tablet Take 1 tablet (4 mg total) by mouth at bedtime. 30 tablet 1   traZODone (DESYREL) 100 MG tablet Take 1 tablet (100 mg total) by mouth at bedtime. 30 tablet 1   No current facility-administered medications for this visit.     Musculoskeletal: Strength & Muscle Tone: Unable to assess due to telemedicine visit Gait & Station: Unable to assess due to telemedicine visit Patient leans: Unable to assess due to telemedicine visit  Psychiatric Specialty  Exam: Review of Systems  Psychiatric/Behavioral:  Negative for decreased concentration, dysphoric mood, hallucinations, self-injury, sleep disturbance and suicidal ideas. The patient is not nervous/anxious and is not hyperactive.    There were no vitals taken for this visit.There is no height or weight on file to calculate BMI.  General Appearance: Unable to assess due to telemedicine visit  Eye Contact:  Unable to assess due to telemedicine visit  Speech:  Clear and Coherent and Normal Rate  Volume:  Normal  Mood:  Euthymic  Affect:  Appropriate  Thought Process:  Coherent and Descriptions of Associations: Intact  Orientation:  Full (Time, Place, and Person)  Thought Content: WDL   Suicidal Thoughts:  No  Homicidal Thoughts:  No  Memory:  Immediate;   Good Recent;   Good Remote;   Fair  Judgement:  Good  Insight:  Good  Psychomotor Activity:  Normal  Concentration:  Concentration: Good and Attention Span: Good  Recall:  Good  Fund of  Knowledge: Fair  Language: Good  Akathisia:  NA  Handed:  Right  AIMS (if indicated): not done  Assets:  Communication Skills Desire for Improvement Housing Social Support  ADL's:  Intact  Cognition: WNL  Sleep:  Good   Screenings: AIMS    Flowsheet Row [facility]  AIMS Total Score 0      GAD-7    Flowsheet Row [facility]  Total GAD-7 Score 0 0 17      PHQ2-9    Flowsheet Row [facility]  PHQ-2 Total Score 0 0 4 0 0  PHQ-9 Total Score -- -- 20 0 0      Flowsheet Row [facility] ([facility]  C-SSRS RISK CATEGORY No Risk No Risk No Risk        Assessment and Plan:   [name] R. Traweek is a 30 year old female with a past psychiatric history significant for major depressive disorder and anxiety who [facility] via virtual telephone visit for follow-up and medication management.  Patient reports no issues or concerns regarding her current medication regimen.  Patient denies the need for dosage adjustments at this time.  Patient's medications to be e-prescribed to pharmacy of choice.  1. Major depressive disorder, recurrent  episode, moderate with anxious distress (HCC)  - benztropine (COGENTIN) 0.5 MG tablet; Take 1 tablet (0.5 mg total) by mouth 2 (two) times daily as needed for tremors (eps).  Dispense: 60 tablet; Refill: 1 - escitalopram (LEXAPRO) 20 MG tablet; Take 1 tablet (20 mg total) by mouth daily.  Dispense: 30 tablet; Refill: 1 - risperiDONE (RISPERDAL M-TABS) 2 MG disintegrating tablet; Take 1 tablet (2 mg total) by mouth daily.  Dispense: 30 tablet; Refill: 1 - risperiDONE (RISPERDAL M-TABS) 4 MG disintegrating tablet; Take 1 tablet (4 mg total) by mouth at bedtime.  Dispense: 30 tablet;  Refill: 1  2. Anxiety state  - escitalopram (LEXAPRO) 20 MG tablet; Take 1 tablet (20 mg total) by mouth daily.  Dispense: 30 tablet; Refill: 1 - LORazepam (ATIVAN) 0.5 MG tablet; Take 1 tablet (0.5 mg total) by mouth 3 (three) times daily.  Dispense: 21 tablet; Refill: 0  3. Insomnia, unspecified type  - traZODone (DESYREL) 100 MG tablet; Take 1 tablet (100 mg total) by mouth at bedtime.  Dispense: 30 tablet; Refill: 1  Patient to follow up in 2 months Provider spent a total of 20 minutes with the patient/reviewing patient's chart  Meta Hatchet, PA 06/26/2021, 2:17 PM

## 2021-07-23 ENCOUNTER — Ambulatory Visit (INDEPENDENT_AMBULATORY_CARE_PROVIDER_SITE_OTHER): Payer: No Payment, Other | Admitting: Clinical

## 2021-07-23 ENCOUNTER — Other Ambulatory Visit: Payer: Self-pay

## 2021-07-23 DIAGNOSIS — F329 Major depressive disorder, single episode, unspecified: Secondary | ICD-10-CM

## 2021-07-23 DIAGNOSIS — F061 Catatonic disorder due to known physiological condition: Secondary | ICD-10-CM

## 2021-07-25 NOTE — Progress Notes (Signed)
   THERAPIST PROGRESS NOTE  Session Time: 30 minutes  Participation Level: Active  Behavioral Response: CasualAlertEuthymic  Type of Therapy: Individual Therapy  Treatment Goals addressed: Coping  Interventions: CBT and Supportive  Summary:  Cheryl Adams is a 30 y.o. female who presents for the scheduled session oriented x5, appropriately dressed, and friendly.  Client denied hallucinations and delusions. Client reported on today she continues to do well.  Client reported she is finding balance with her daily routine.  Client reported she is stretching, meditating and listening to music.  Client reported she will be adding exercise to her regimen symptoms.  Client reported she has started a new job with [employer].  Client reported she is adjusting to the new position seemingly well since she has prior experience working in a pizza place.  Client reported she feels that she has increased confidence in making decisions.  Client reported she is still working on her communication and maintaining interpersonal relationships.  Client reported things easy for her to be concerned and what she is doing and forget to reach out to people.    Suicidal/Homicidal: Nowithout intent/plan  Therapist Response:  Therapist began the session with the client how she has been doing since last seen. Therapist used CBT to utilize active listening and positive emotional support. Therapist used CBT to engage the client to discuss worklife balance. Therapist assigned the client homework to continue practicing self-care and enforcing boundaries. Client was scheduled for next appointment.    Plan: Return again in 5 weeks.  Diagnosis: MDD, single episode, with catatonic features  Neena Rhymes Neyland Pettengill, LCSW 07/23/2021

## 2021-08-28 ENCOUNTER — Other Ambulatory Visit: Payer: Self-pay

## 2021-08-28 ENCOUNTER — Ambulatory Visit (INDEPENDENT_AMBULATORY_CARE_PROVIDER_SITE_OTHER): Payer: No Payment, Other | Admitting: Clinical

## 2021-08-28 DIAGNOSIS — F061 Catatonic disorder due to known physiological condition: Secondary | ICD-10-CM

## 2021-08-28 DIAGNOSIS — F329 Major depressive disorder, single episode, unspecified: Secondary | ICD-10-CM | POA: Diagnosis not present

## 2021-08-28 NOTE — Progress Notes (Signed)
   THERAPIST PROGRESS NOTE  Session Time: 20 minutes  Participation Level: Active  Behavioral Response: CasualAlertEuthymic  Type of Therapy: Individual Therapy  Treatment Goals addressed: Coping  Interventions: CBT and Supportive  Summary:  Cheryl Adams is a 30 y.o. female who presents for the scheduled session oriented times five, appropraitely dressed, and friendly. Client denied hallucinations and delusions. Client reported on today she is doing well since last seen. Client reported she feels her improvement is constant and is evident by her daily functioning. Client reported she is doing well with her job at [employer] and works most of the week with off time. Client reported she continues to incorporate a good balance of self care and exercise. Client reported she is medication compliant without side effects. Client reported she is maintaining well on her own and agreeable to discontinuing therapy at this time. Client reported in the future she will attend walk in therapy hours as needed or call to schedule a appointment.       Suicidal/Homicidal: Nowithout intent/plan  Therapist Response:  Therapist asked the client how she has been doing since last seen. Therapist used CBT to utilize active listening and positive emotional support.  Therapist used CBT to ask the client to identify continued practices she is incorporating to improve her mental health and daily functioning. Therapist used CBT to acknowledge the clients positives and reinforce the use of routine and boundaries. Therapist updated the clients treatment plan goals to met. Therapist provided the client with information for walk in hours for her future reference.     Plan: Client will discontinue scheduled therapy appointments at this time. Client is agreeable to presenting for therapy walk in for future reference. Client was made aware of [facility] urgent care and mobile crisis.  Diagnosis: MDD, single episode,  with catatonic features     Y , LCSW 08/28/2021  

## 2021-09-02 ENCOUNTER — Encounter (HOSPITAL_COMMUNITY): Payer: No Payment, Other | Admitting: Physician Assistant

## 2021-10-16 ENCOUNTER — Encounter (HOSPITAL_COMMUNITY): Payer: Self-pay

## 2021-10-16 ENCOUNTER — Emergency Department (HOSPITAL_COMMUNITY)
Admission: EM | Admit: 2021-10-16 | Discharge: 2021-10-22 | Disposition: A | Discharge status: Other Acute Inpatient Hospital | Payer: Medicaid Other | Attending: Emergency Medicine | Admitting: Emergency Medicine

## 2021-10-16 DIAGNOSIS — Z79899 Other long term (current) drug therapy: Secondary | ICD-10-CM | POA: Insufficient documentation

## 2021-10-16 DIAGNOSIS — N9489 Other specified conditions associated with female genital organs and menstrual cycle: Secondary | ICD-10-CM | POA: Insufficient documentation

## 2021-10-16 DIAGNOSIS — R4689 Other symptoms and signs involving appearance and behavior: Secondary | ICD-10-CM | POA: Insufficient documentation

## 2021-10-16 DIAGNOSIS — F32A Depression, unspecified: Secondary | ICD-10-CM

## 2021-10-16 DIAGNOSIS — F23 Brief psychotic disorder: Secondary | ICD-10-CM

## 2021-10-16 DIAGNOSIS — F333 Major depressive disorder, recurrent, severe with psychotic symptoms: Secondary | ICD-10-CM | POA: Insufficient documentation

## 2021-10-16 DIAGNOSIS — F29 Unspecified psychosis not due to a substance or known physiological condition: Secondary | ICD-10-CM

## 2021-10-16 DIAGNOSIS — U071 COVID-19: Secondary | ICD-10-CM | POA: Insufficient documentation

## 2021-10-16 LAB — CBC WITH DIFFERENTIAL/PLATELET
Abs Immature Granulocytes: 0.05 10*3/uL (ref 0.00–0.07)
Basophils Absolute: 0 10*3/uL (ref 0.0–0.1)
Basophils Relative: 0 %
Eosinophils Absolute: 0 10*3/uL (ref 0.0–0.5)
Eosinophils Relative: 0 %
HCT: 41.3 % (ref 36.0–46.0)
Hemoglobin: 13.4 g/dL (ref 12.0–15.0)
Immature Granulocytes: 1 %
Lymphocytes Relative: 5 %
Lymphs Abs: 0.3 10*3/uL — ABNORMAL LOW (ref 0.7–4.0)
MCH: 26.3 pg (ref 26.0–34.0)
MCHC: 32.4 g/dL (ref 30.0–36.0)
MCV: 81.1 fL (ref 80.0–100.0)
Monocytes Absolute: 0.7 10*3/uL (ref 0.1–1.0)
Monocytes Relative: 10 %
Neutro Abs: 5.5 10*3/uL (ref 1.7–7.7)
Neutrophils Relative %: 84 %
Platelets: 230 10*3/uL (ref 150–400)
RBC: 5.09 MIL/uL (ref 3.87–5.11)
RDW: 14.7 % (ref 11.5–15.5)
WBC: 6.6 10*3/uL (ref 4.0–10.5)
nRBC: 0 % (ref 0.0–0.2)

## 2021-10-16 LAB — RESP PANEL BY RT-PCR (FLU A&B, COVID) ARPGX2
Influenza A by PCR: NEGATIVE
Influenza B by PCR: NEGATIVE
SARS Coronavirus 2 by RT PCR: POSITIVE — AB

## 2021-10-16 LAB — COMPREHENSIVE METABOLIC PANEL
ALT: 12 U/L (ref 0–44)
AST: 15 U/L (ref 15–41)
Albumin: 3.5 g/dL (ref 3.5–5.0)
Alkaline Phosphatase: 51 U/L (ref 38–126)
Anion gap: 12 (ref 5–15)
BUN: 5 mg/dL — ABNORMAL LOW (ref 6–20)
CO2: 21 mmol/L — ABNORMAL LOW (ref 22–32)
Calcium: 8.6 mg/dL — ABNORMAL LOW (ref 8.9–10.3)
Chloride: 100 mmol/L (ref 98–111)
Creatinine, Ser: 1 mg/dL (ref 0.44–1.00)
GFR, Estimated: >60 mL/min (ref >60)
Glucose, Bld: 147 mg/dL — ABNORMAL HIGH (ref 70–99)
Potassium: 2.9 mmol/L — ABNORMAL LOW (ref 3.5–5.1)
Sodium: 133 mmol/L — ABNORMAL LOW (ref 135–145)
Total Bilirubin: 1.4 mg/dL — ABNORMAL HIGH (ref 0.3–1.2)
Total Protein: 7.1 g/dL (ref 6.5–8.1)

## 2021-10-16 LAB — RAPID URINE DRUG SCREEN, HOSP PERFORMED
Amphetamines: NOT DETECTED
Barbiturates: NOT DETECTED
Benzodiazepines: NOT DETECTED
Cocaine: NOT DETECTED
Opiates: NOT DETECTED
Tetrahydrocannabinol: NOT DETECTED

## 2021-10-16 LAB — I-STAT BETA HCG BLOOD, ED (MC, WL, AP ONLY): I-stat hCG, quantitative: <5 m[IU]/mL (ref <5)

## 2021-10-16 LAB — SALICYLATE LEVEL: Salicylate Lvl: <7 mg/dL — ABNORMAL LOW (ref 7.0–30.0)

## 2021-10-16 LAB — ACETAMINOPHEN LEVEL: Acetaminophen (Tylenol), Serum: <10 ug/mL — ABNORMAL LOW (ref 10–30)

## 2021-10-16 LAB — ETHANOL: Alcohol, Ethyl (B): <10 mg/dL (ref <10)

## 2021-10-16 MED ORDER — ACETAMINOPHEN 500 MG PO TABS
1000.0000 mg | ORAL_TABLET | Freq: Once | ORAL | Status: AC
  Administered 2021-10-16: 22:00:00 1000 mg via ORAL
  Filled 2021-10-16: qty 2

## 2021-10-16 MED ORDER — POTASSIUM CHLORIDE CRYS ER 20 MEQ PO TBCR
40.0000 meq | EXTENDED_RELEASE_TABLET | Freq: Once | ORAL | Status: AC
  Administered 2021-10-16: 14:00:00 40 meq via ORAL
  Filled 2021-10-16: qty 2

## 2021-10-16 NOTE — ED Notes (Signed)
Called staffing to notify of need for sitter for patient.

## 2021-10-16 NOTE — BH Assessment (Addendum)
Clinician messaged Tonna Boehringer, RN: "Hey it's Trey with TTS. Is the pt able to be assessed, if so can you send a copy of the IVC [phone])."    Clinician awaiting response.    Redmond Pulling, MS, [facility], [facility] Triage Specialist [phone]

## 2021-10-16 NOTE — ED Notes (Signed)
Patient denies pain and is resting comfortably.  
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
## 2021-10-16 NOTE — ED Notes (Signed)
Mother at bedside, talking with PD and RN.

## 2021-10-16 NOTE — ED Notes (Signed)
Faxed IVC paperwork to [phone]

## 2021-10-16 NOTE — ED Notes (Signed)
Wanded by security. Mother at bedside.

## 2021-10-16 NOTE — ED Notes (Signed)
MOTHER, ROCHELLE Partain, IS CONTACT PERSON, PHONE # [phone]

## 2021-10-16 NOTE — ED Notes (Signed)
Sitter arrived and is at bedside with patient.

## 2021-10-16 NOTE — ED Notes (Signed)
ED Provider at bedside. 

## 2021-10-16 NOTE — ED Triage Notes (Addendum)
Family called PD for psychotic episode, PD used tazor, patient in custody and with IVC paperwork, has not taken medication for several days per family report.
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
## 2021-10-16 NOTE — BH Assessment (Addendum)
@  1429, requested patients nurse, Dois Davenport, RN to place the TTS machine in patient's room.   @1432 , notified , MD and Tanda Rockers, PA that TTS is ready to assess patient. Requesting that TTS machine is set up for patient.   @1450 , contacted the nursing secretary and provided notification that TTS has requested assistance with moving the TTS cart to patient's room. Nursing secretary will notify nursing.   @1455 , Clinician unable to get nursing and/or providers to assist with setting up the TTS machine. Requested nursing and providers to notify TTS when patient is ready to be seen.

## 2021-10-16 NOTE — ED Notes (Signed)
Walked patient to the bathroom patient did well got patient urine sample gavr patient some apple juice patient is back in bed with family at bedside

## 2021-10-17 ENCOUNTER — Other Ambulatory Visit: Payer: Self-pay

## 2021-10-17 ENCOUNTER — Encounter (HOSPITAL_COMMUNITY): Payer: Self-pay | Admitting: Emergency Medicine

## 2021-10-17 DIAGNOSIS — F23 Brief psychotic disorder: Secondary | ICD-10-CM

## 2021-10-17 DIAGNOSIS — F29 Unspecified psychosis not due to a substance or known physiological condition: Secondary | ICD-10-CM

## 2021-10-17 MED ORDER — ESCITALOPRAM OXALATE 10 MG PO TABS
20.0000 mg | ORAL_TABLET | Freq: Every day | ORAL | Status: DC
  Administered 2021-10-17 – 2021-10-22 (×6): 20 mg via ORAL
  Filled 2021-10-17 (×7): qty 2

## 2021-10-17 MED ORDER — TRAZODONE HCL 50 MG PO TABS
50.0000 mg | ORAL_TABLET | Freq: Every day | ORAL | Status: DC
  Administered 2021-10-17 – 2021-10-21 (×5): 50 mg via ORAL
  Filled 2021-10-17 (×5): qty 1

## 2021-10-17 MED ORDER — LORAZEPAM 1 MG PO TABS
1.0000 mg | ORAL_TABLET | Freq: Once | ORAL | Status: AC
  Administered 2021-10-17: 1 mg via ORAL
  Filled 2021-10-17: qty 1

## 2021-10-17 MED ORDER — BENZTROPINE MESYLATE 1 MG PO TABS
0.5000 mg | ORAL_TABLET | Freq: Two times a day (BID) | ORAL | Status: DC | PRN
  Filled 2021-10-17: qty 1

## 2021-10-17 MED ORDER — RISPERIDONE 1 MG PO TBDP
2.0000 mg | ORAL_TABLET | Freq: Every day | ORAL | Status: DC
  Administered 2021-10-17 – 2021-10-21 (×5): 2 mg via ORAL
  Filled 2021-10-17 (×6): qty 2

## 2021-10-17 NOTE — ED Notes (Signed)
Pt having difficulty understanding social distancing, covid precautions, staying in room. Explained with rationale. Increased returning to common area. Redirected to room. Declines need for chair. Prefers sitting on side of bed at this time. Has crayons and paper. Given table to color.

## 2021-10-17 NOTE — ED Notes (Signed)
Pt resting reclined. Alert, NAD, calm, interactive, resps e/u. Denies questions or needs. Given ice per request. Sitter present.
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
## 2021-10-17 NOTE — ED Notes (Signed)
TTS at the bedside. 

## 2021-10-17 NOTE — ED Notes (Signed)
Provided pt with beverage.

## 2021-10-17 NOTE — ED Notes (Signed)
Pt verbalizes: -wants an ice pack, but "not sure why". Denies injury or feeling hot.  -wants to change rooms, but "not sure why". Denies complaint or needs. RN offered changes within room, no other room available. Denies wants or needs.  Calm, NAD.

## 2021-10-18 NOTE — Progress Notes (Signed)
Per Loreen Freud, patient meets criteria for inpatient treatment. There are no available or appropriate beds at [facility] today. CSW faxed referrals to the following facilities for review:  [facility]  Pending - No Request Sent N/A [address] [place] [address] [phone] --  [facility]  Pending - No Request Sent N/A [address], [place] [address] [phone] --  [facility]  Pending - No Request Sent N/A [address] [place] [address] [phone] --  [facility]  Pending - No Request Sent N/A [address] [place] [address] [phone] --  [facility]  Pending - No Request Sent N/A [address] [place], [place] [address] [phone] --  [facility]  Pending - No Request Sent N/A [address] [place] [address] [phone] --  [facility]  Pending - No Request Sent N/A [address], [place] [address] [phone] --  [facility]  Pending - No Request Sent N/A [address] [place] [address] [phone] --  [facility]  Pending - No Request Sent N/A [address] [place] [address] [phone] --  [facility]  Pending - No Request Sent N/A [address] [place] [address] [phone] --  [facility]  Pending - No Request Sent N/A [address] [place] [address] [phone] --  [facility]  Pending - No Request Sent N/A [address] [name] [address] [phone] --  [facility]  Pending - No Request Sent N/A [address] [place] [address] [phone] --    TTS will  continue to seek bed placement.  Crissie Reese, MSW, LCSW-A, LCAS-A Phone: [phone] Disposition/TOC

## 2021-10-18 NOTE — ED Notes (Signed)
Pt awake at this time, ambulated to BR. Given breakfast tray. Denies needs.

## 2021-10-18 NOTE — ED Notes (Signed)
Patient ambulatory to restroom, denies needs at this time.

## 2021-10-19 NOTE — ED Notes (Signed)
Pt lying in bed on back with eyes closed. NAD noted. Even and equal respirations. Care Plan continued

## 2021-10-19 NOTE — ED Provider Notes (Signed)
Emergency Medicine Observation Re-evaluation Note  Cheryl Adams is a 30 y.o. female, seen on rounds today.  Pt initially presented to the ED for complaints of IVC Currently, the patient is ivc'd due to psychotic agitation.  Physical Exam  BP 123/75 (BP Location: Left Arm)   Pulse 80   Temp 98.4 F (36.9 C) (Oral)   Resp 16   SpO2 100%  Physical Exam General: wdwn Cardiac: rrr Lungs: no distress Psych: sitting calmly  ED Course / MDM  EKG:EKG Interpretation  Date/Time:  Thursday October 16 2021 10:47:39 EST Ventricular Rate:  109 PR Interval:  151 QRS Duration: 84 QT Interval:  326 QTC Calculation: 439 R Axis:   67 Text Interpretation: Sinus tachycardia No acute changes No significant change since last tracing Confirmed by Derwood Kaplan [phone]) on 10/17/2021 11:00:41 AM  I have reviewed the labs performed to date as well as medications administered while in observation.  Recent changes in the last 24 hours include none.  Plan  Current plan is for awaiting psych placement.Cheryl Adams is under involuntary commitment.      Cheryl Grizzle, MD 10/19/21 1520

## 2021-10-19 NOTE — ED Notes (Signed)
Dinner delivered.

## 2021-10-19 NOTE — ED Notes (Signed)
Pt ambulated to restroom and back without assistance

## 2021-10-19 NOTE — ED Notes (Signed)
Pt resting in bed on back with eyes closed. TV playing background sounds. NAD noted. Equal and even respirations. Care plan continues
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
## 2021-10-19 NOTE — ED Notes (Signed)
Patient talking with therapist via TTS.

## 2021-10-19 NOTE — ED Notes (Signed)
Pt resting in bed on back with eyes closed. Not distressed noted. Care plan continuous

## 2021-10-20 NOTE — ED Notes (Signed)
Breakfast orders placed 

## 2021-10-20 NOTE — Progress Notes (Signed)
Patient has been faxed out due to no beds available at [facility]. Patient meets BH inpatient criteria per Liborio Nixon, NP. Patient has been faxed to the following facilities:   [facility]  [address], [place] [address] [phone]  [facility]  [address] [place] [address] [phone]  [facility]  [address] [place] [address] [phone]  [facility]  [address] [place] [address] [phone]  [facility]  [address] [place] [address] [phone]  [facility]  [address] [place], [place] [address] [phone]  [facility]  [address] [place], [place] [address] [phone]  [facility]  [address] [place] [address] [phone]  [facility]  [address] [place] [address] [phone]  [facility]  [address] [place] [address] [phone]  [facility]  [address], [place] [address] [phone]  Damita Dunnings, MSW, LCSW-A  1:50 PM 10/20/2021

## 2021-10-20 NOTE — ED Notes (Signed)
Patient up to bathroom

## 2021-10-21 NOTE — ED Notes (Signed)
Patient was woke enough to eat  her breakfast went back to sleep

## 2021-10-21 NOTE — Progress Notes (Addendum)
Pt was accepted to [facility]  10/22/21 after 9:00am; Bed Assignment Emerson A. Pt needs to check in at the [facility] building when she arrives.  Pt meets inpatient criteria per Ophelia Shoulder, NP  Attending Physician will be Dr. Sallyanne Kuster  Report can be called to: [phone]  Pt can arrive after 9:00am  Nursing please fax IVC paperwork to [phone]  Care team notified via secure chat: [place], RN. CSW requested that EDP be added to the secure chat so that they are aware of the updated disposition for this pt. CSW also requested that nursing coordinate transport with law enforcement due to pt being IVC'd.  Kelton Pillar, LCSWA 10/21/2021 @ 11:09 PM

## 2021-10-21 NOTE — ED Notes (Signed)
Pt remained in bed throughout the shift. Pt appearing lethargic, and reports fatigue, denies pain.

## 2021-10-21 NOTE — Progress Notes (Addendum)
This CSW sent out of network referral for inpatient behavioral health placement per pt meets inpatient behavioral criteria per Ophelia Shoulder, NP. There are no appropriate beds at [facility] per [facility] Fransico Michael, RN.  Destination Service Provider Address Phone Fax  [facility]  [address], [place] [address] [phone]  [facility] [address] [place]  [address], [place] [address] [phone]  [facility]  [address], [place] [address] [phone]  [facility]  [address] [place] [address] [phone]  [facility]  [address] [place], [place] [address] [phone]  [facility]  [address] [place] [address] [phone]  [facility]  [address] [place] [address] [phone]  [facility]  [address], [place] [address] [phone]  [facility]  [address] [place] [address] [phone]  [facility]  [address], [place] [address] [phone]  [facility]  [address] [place] [address] [phone]  [facility]  [address], [place] [address] [phone]  [facility]  [address] [place] [address] [phone]  [facility] [address]  [address], [place] [address] [phone]  [facility]  [address], [place] [address] [phone]  [facility]  [address] [place] [address] [phone]  [facility]  [address], [place] [address]  [phone]  [facility]  [address] [place] [address] [phone]  [facility]  [address] [place] [address] [phone]  [facility]  [address] [place], [place] [address] [phone]  [facility]  [address] [place] [address]     Maryjean Ka, MSW, [facility] 10/21/2021 10:30 PM

## 2021-10-21 NOTE — ED Provider Notes (Signed)
Emergency Medicine Observation Re-evaluation Note  Cheryl Adams is a 30 y.o. female, seen on rounds today.  Pt initially presented to the ED for complaints of IVC Currently, the patient is watching TV.  Physical Exam  BP 115/84 (BP Location: Left Arm)   Pulse 77   Temp 98.8 F (37.1 C) (Oral)   Resp 14   SpO2 98%  Physical Exam General: Awake, alert Cardiac: Extremities well perfused Lungs: Breathing is even and unlabored Psych: No agitation, not responding to internal stimuli  ED Course / MDM  EKG:EKG Interpretation  Date/Time:  Thursday October 16 2021 10:47:39 EST Ventricular Rate:  109 PR Interval:  151 QRS Duration: 84 QT Interval:  326 QTC Calculation: 439 R Axis:   67 Text Interpretation: Sinus tachycardia No acute changes No significant change since last tracing Confirmed by Derwood Kaplan [phone]) on 10/17/2021 11:00:41 AM  I have reviewed the labs performed to date as well as medications administered while in observation.  Recent changes in the last 24 hours include none.  Plan  Current plan is for placement. AYLYN WENZLER is under involuntary commitment.      Gloris Manchester, MD 10/21/21 [phone]

## 2021-10-21 NOTE — Progress Notes (Signed)
Patient has been faxed out per the request of Dr. Bronwen Betters. Patient meets [facility] inpatient criteria per Ophelia Shoulder, NP. Patient has been faxed out to the following facilities:   [facility]  [address] [place] [address] [phone]  [facility] [address] [place]  [address], [place] [address] [phone]  [facility]  [address], [place] [address] [phone]  [facility]  [address] [place] [address] [phone]  [facility]  [address] [place], [place] [address] [phone]  [facility]  [address] [place] [address] [phone]  [facility]  [address] [place] [address] [phone]  [facility]  [address] [place] [address] [phone]  [facility]  [address], [place] [address] [phone]  [facility]  [address], [place] [address] [phone]  [facility]  [address] [place] [address] [phone]  [facility]  [address] [place] [address] [phone]  [facility]  [address] [place] [address] [phone]   Damita Dunnings, MSW, LCSW-A  12:59 PM 10/21/2021

## 2021-10-22 MED ORDER — POTASSIUM CHLORIDE CRYS ER 20 MEQ PO TBCR
40.0000 meq | EXTENDED_RELEASE_TABLET | Freq: Once | ORAL | Status: AC
  Administered 2021-10-22: 40 meq via ORAL
  Filled 2021-10-22: qty 2

## 2021-10-22 NOTE — Discharge Instructions (Addendum)
Transfer to [facility] 

## 2021-10-22 NOTE — ED Notes (Signed)
Sheriffs dept called to transport patient

## 2021-10-22 NOTE — ED Provider Notes (Addendum)
Emergency Medicine Observation Re-evaluation Note  Cheryl Adams is a 31 y.o. female, seen on rounds today.  Pt initially presented to the ED for complaints of IVC Pt with agitated behavior, depression, acute psychosis. BH meds have been restarted. Patient accepted to [facility].   Physical Exam  BP (!) 141/99 (BP Location: Left Arm)   Pulse 86   Temp 98.1 F (36.7 C) (Oral)   Resp 18   SpO2 100%  Physical Exam General: calm, alert, nad.  Cardiac: regular rate Lungs: breathing comfortably Psych: calm, alert.   ED Course / MDM    I have reviewed the labs performed to date as well as medications administered while in observation.  Recent changes in the last 24 hours include ED observation, med management, [facility] consultation and placement.   Plan  Current plan is fpt accepted at [facility], Dr Robet Leu.  Cheryl Adams is under involuntary commitment.  Prior covid test +, patient without cough or sob, no increased wob, pulse ox 100%.  Prior k mildly low - kcl previously given. Pt has been eating/drinking.   Patient currently appears stable for transport/transfer.         Cathren Laine, MD 10/22/21 [phone]

## 2021-10-22 NOTE — ED Notes (Signed)
Break order placed 

## 2021-11-06 ENCOUNTER — Ambulatory Visit (INDEPENDENT_AMBULATORY_CARE_PROVIDER_SITE_OTHER): Payer: No Payment, Other | Admitting: Physician Assistant

## 2021-11-06 ENCOUNTER — Encounter (HOSPITAL_COMMUNITY): Payer: Self-pay | Admitting: Physician Assistant

## 2021-11-06 DIAGNOSIS — G4700 Insomnia, unspecified: Secondary | ICD-10-CM

## 2021-11-06 DIAGNOSIS — F411 Generalized anxiety disorder: Secondary | ICD-10-CM

## 2021-11-06 DIAGNOSIS — F331 Major depressive disorder, recurrent, moderate: Secondary | ICD-10-CM | POA: Diagnosis not present

## 2021-11-06 MED ORDER — RISPERIDONE 4 MG PO TBDP: 4.0000 mg | ORAL_TABLET | Freq: Every day | ORAL | 1 refills | Status: DC

## 2021-11-06 MED ORDER — BENZTROPINE MESYLATE 0.5 MG PO TABS: 0.5000 mg | ORAL_TABLET | Freq: Two times a day (BID) | ORAL | 1 refills | Status: DC | PRN

## 2021-11-06 MED ORDER — RISPERIDONE 2 MG PO TBDP: 2.0000 mg | ORAL_TABLET | Freq: Every day | ORAL | 1 refills | Status: DC

## 2021-11-06 MED ORDER — TRAZODONE HCL 100 MG PO TABS: 100.0000 mg | ORAL_TABLET | Freq: Every day | ORAL | 1 refills | Status: DC

## 2021-11-06 MED ORDER — ESCITALOPRAM OXALATE 20 MG PO TABS: 20.0000 mg | ORAL_TABLET | Freq: Every day | ORAL | 1 refills | Status: DC

## 2021-11-06 NOTE — Progress Notes (Signed)
BH MD/PA/NP OP Progress Note  11/06/2021 8:15 AM Cheryl Adams  MRN:  [id number]  Chief Complaint:  Chief Complaint   Stress    HPI:   Cheryl Adams is a 30 year old female with a past psychiatric history significant for major depressive disorder, anxiety, and insomnia who presents to [facility] behavioral health outpatient clinic for follow-up and medication management.  Patient is currently being managed on the following medications:  Escitalopram 20 mg daily Lorazepam 0.5 mg 3 times daily as needed. Trazodone 100 mg at bedtime Benztropine 0.5 mg 3 times daily Risperidone 2 mg daily/4 mg at bedtime  Patient reports no issues or concerns regarding her current medication regimen.  She reports being okay but does express that she has difficulty expressing how she feels at times.  She also states that it is hard to talk about her feelings.  In regards to her mood, patient states that she feels that she is heading towards a depressive state.  She states that she currently endorses depressive symptoms once in a while characterized by the following: feelings of sadness, lack of motivation, and decreased energy.  She states that her depressive symptoms are alleviated by music.  Patient endorses anxiety and rates her anxiety a 5 out of 10.  Patient denies any new stressors at this time and states that she is in the process of working on her emotions.  Patient states that she has set up with counseling through our services.  A PHQ-9 screen was performed with the patient scoring a 9.  A GAD-7 screen was also performed with the patient scoring a 7.  Patient is alert and oriented x4, calm, cooperative, and fully engaged in conversation during the encounter.  Patient endorses decent mood.  Patient denies suicidal or homicidal ideations.  She further denies auditory or visual hallucinations and does not appear to be responding to internal/external stimuli.  Patient endorses fair sleep and receives on  average 6 to 7 hours of sleep each night.  She still endorses some sleepiness at times.  Patient endorses good appetite and eats on average 2-3 meals per day.  Patient denies alcohol consumption, tobacco use, and illicit drug use.  Visit Diagnosis:    ICD-10-CM   1. Major depressive disorder, recurrent episode, moderate with anxious distress (HCC)  F33.1 benztropine (COGENTIN) 0.5 MG tablet    risperiDONE (RISPERDAL M-TABS) 2 MG disintegrating tablet    escitalopram (LEXAPRO) 20 MG tablet    risperiDONE (RISPERDAL M-TABS) 4 MG disintegrating tablet    2. Insomnia, unspecified type  G47.00 traZODone (DESYREL) 100 MG tablet    3. Anxiety state  F41.1 escitalopram (LEXAPRO) 20 MG tablet      Past Psychiatric History:  Major depressive disorder Anxiety Insomnia  Past Medical History:  Past Medical History:  Diagnosis Date   Anemia    Chronic tonsillitis 10/2014   Cough 11/09/2014   Difficulty swallowing pills     Past Surgical History:  Procedure Laterality Date   TONSILLECTOMY AND ADENOIDECTOMY Bilateral 11/14/2014   Procedure: BILATERAL TONSILLECTOMY AND ADENOIDECTOMY;  Surgeon: Flo Shanks, MD;  Location: [facility];  Service: ENT;  Laterality: Bilateral;   TYMPANOSTOMY TUBE PLACEMENT      Family Psychiatric History:  Patient is unsure of psychiatric history  Family History: History reviewed. No pertinent family history.  Social History:  Social History   Socioeconomic History   Marital status: Single    Spouse name: Not on file   Number of children: Not  on file   Years of education: Not on file   Highest education level: Not on file  Occupational History   Not on file  Tobacco Use   Smoking status: Never   Smokeless tobacco: Never  Substance and Sexual Activity   Alcohol use: No   Drug use: No   Sexual activity: Not on file  Other Topics Concern   Not on file  Social History Narrative   Not on file   Social Determinants of Health    Financial Resource Strain: Not on file  Food Insecurity: Not on file  Transportation Needs: Not on file  Physical Activity: Not on file  Stress: Not on file  Social Connections: Not on file    Allergies: No Known Allergies  Metabolic Disorder Labs: Lab Results  Component Value Date   HGBA1C 5.8 (H) 04/29/2021   MPG 120 04/29/2021   No results found for: PROLACTIN Lab Results  Component Value Date   CHOL 168 05/02/2021   TRIG 41 05/02/2021   HDL 66 05/02/2021   CHOLHDL 2.5 05/02/2021   VLDL 8 05/02/2021   LDLCALC 94 05/02/2021   LDLCALC 92 04/29/2021   Lab Results  Component Value Date   TSH 1.052 04/29/2021   TSH 1.352 02/26/2015    Therapeutic Level Labs: No results found for: LITHIUM No results found for: VALPROATE No components found for:  CBMZ  Current Medications: Current Outpatient Medications  Medication Sig Dispense Refill   benztropine (COGENTIN) 0.5 MG tablet Take 1 tablet (0.5 mg total) by mouth 2 (two) times daily as needed for tremors (eps). 60 tablet 1   escitalopram (LEXAPRO) 20 MG tablet Take 1 tablet (20 mg total) by mouth daily. 30 tablet 1   ferrous sulfate 325 (65 FE) MG tablet Take 325 mg by mouth daily.     LORazepam (ATIVAN) 0.5 MG tablet Take 1 tablet (0.5 mg total) by mouth 3 (three) times daily. (Patient not taking: Reported on 10/16/2021) 21 tablet 0   risperiDONE (RISPERDAL M-TABS) 2 MG disintegrating tablet Take 1 tablet (2 mg total) by mouth daily. 30 tablet 1   risperiDONE (RISPERDAL M-TABS) 4 MG disintegrating tablet Take 1 tablet (4 mg total) by mouth at bedtime. 30 tablet 1   traZODone (DESYREL) 100 MG tablet Take 1 tablet (100 mg total) by mouth at bedtime. 30 tablet 1   No current facility-administered medications for this visit.     Musculoskeletal: Strength & Muscle Tone: within normal limits Gait & Station: normal Patient leans: N/A  Psychiatric Specialty Exam: Review of Systems  Psychiatric/Behavioral:  Negative  for decreased concentration, dysphoric mood, hallucinations, self-injury, sleep disturbance and suicidal ideas. The patient is nervous/anxious. The patient is not hyperactive.    Blood pressure 121/74, pulse 80, height 5\' 5"  (1.651 m), weight 239 lb (108.4 kg).Body mass index is 39.77 kg/m.  General Appearance: Well Groomed  Eye Contact:  Good  Speech:  Clear and Coherent and Normal Rate  Volume:  Normal  Mood:  Anxious and Depressed  Affect:  Congruent  Thought Process:  Coherent, Goal Directed, and Descriptions of Associations: Intact  Orientation:  Full (Time, Place, and Person)  Thought Content: WDL   Suicidal Thoughts:  No  Homicidal Thoughts:  No  Memory:  Immediate;   Good Recent;   Good Remote;   Fair  Judgement:  Good  Insight:  Good  Psychomotor Activity:  Normal  Concentration:  Concentration: Good and Attention Span: Good  Recall:  Good  Fund of  Knowledge: Fair  Language: Good  Akathisia:  NA  Handed:  Right  AIMS (if indicated): not done  Assets:  Communication Skills Desire for Improvement Housing Social Support  ADL's:  Intact  Cognition: WNL  Sleep:  Good   Screenings: AIMS    Flowsheet Row [facility]  AIMS Total Score 0      GAD-7    Flowsheet Row Clinical Support [facility]06/2021 in [facility] Video Visit from 06/26/2021 in [facility] Office Visit from 05[facility]14/2021 in [facility] Counselor from 04/02/2021 in [facility]  Total GAD-7 Score 7 0 0 17      PHQ2-9    Flowsheet Row Clinical Support from 11/06/2021 in [facility] Video Visit from 06/26/2021 in [facility] Office Visit from 05/14/2021 in [facility] Counselor from 04/02/2021 in [facility] Office  Visit from 02/07/2020 in [facility]  PHQ-2 Total Score 2 0 0 4 0  PHQ-9 Total Score 9 -- -- 20 0      Flowsheet Row Clinical Support from 11/06/2021 in [facility] ED from 10/16/2021 in [facility] EMERGENCY DEPARTMENT Video Visit from 06/26/2021 in [facility]  C-SSRS RISK CATEGORY No Risk No Risk No Risk        Assessment and Plan:   Cheryl Adams is a 30 year old female with a past psychiatric history significant for major depressive disorder, anxiety, and insomnia who presents to [facility] behavioral health outpatient clinic for follow-up and medication management.  Patient reports that she continues to take her medications as prescribed and voiced no issues or concerns regarding her current regimen.  Patient endorses some depressive symptoms and anxiety.  Patient states that she is currently working through her emotions and is set up with counseling.  Patient denies the need for dosage adjustments at this time and is requesting refills on her medications following the conclusion of the encounter.  Patient's medications to be e-prescribed to pharmacy of choice.  1. Major depressive disorder, recurrent episode, moderate with anxious distress (HCC)  - benztropine (COGENTIN) 0.5 MG tablet; Take 1 tablet (0.5 mg total) by mouth 2 (two) times daily as needed for tremors (eps).  Dispense: 60 tablet; Refill: 1 - risperiDONE (RISPERDAL M-TABS) 2 MG disintegrating tablet; Take 1 tablet (2 mg total) by mouth daily.  Dispense: 30 tablet; Refill: 1 - escitalopram (LEXAPRO) 20 MG tablet; Take 1 tablet (20 mg total) by mouth daily.  Dispense: 30 tablet; Refill: 1 - risperiDONE (RISPERDAL M-TABS) 4 MG disintegrating tablet; Take 1 tablet (4 mg total) by mouth at bedtime.  Dispense: 30 tablet; Refill: 1  2. Insomnia, unspecified type  - traZODone (DESYREL) 100 MG tablet; Take 1 tablet (100 mg total)  by mouth at bedtime.  Dispense: 30 tablet; Refill: 1  3. Anxiety state  - escitalopram (LEXAPRO) 20 MG tablet; Take 1 tablet (20 mg total) by mouth daily.  Dispense: 30 tablet; Refill: 1  Patient to follow up in 2 months Provider spent a total of 19 minutes with the patient/reviewing the patient's charts  Meta Hatchet, PA 11/06/2021, 8:15 AM

## 2022-01-09 ENCOUNTER — Other Ambulatory Visit (HOSPITAL_COMMUNITY): Payer: Self-pay | Admitting: Physician Assistant

## 2022-01-09 DIAGNOSIS — F331 Major depressive disorder, recurrent, moderate: Secondary | ICD-10-CM

## 2022-01-13 ENCOUNTER — Ambulatory Visit (INDEPENDENT_AMBULATORY_CARE_PROVIDER_SITE_OTHER): Payer: 59 | Admitting: Physician Assistant

## 2022-01-13 ENCOUNTER — Other Ambulatory Visit: Payer: Self-pay

## 2022-01-13 ENCOUNTER — Encounter (HOSPITAL_COMMUNITY): Payer: Self-pay | Admitting: Physician Assistant

## 2022-01-13 DIAGNOSIS — F411 Generalized anxiety disorder: Secondary | ICD-10-CM | POA: Diagnosis not present

## 2022-01-13 DIAGNOSIS — G4700 Insomnia, unspecified: Secondary | ICD-10-CM | POA: Diagnosis not present

## 2022-01-13 DIAGNOSIS — F331 Major depressive disorder, recurrent, moderate: Secondary | ICD-10-CM

## 2022-01-13 MED ORDER — RISPERIDONE 1 MG PO TBDP: 1.0000 mg | ORAL_TABLET | Freq: Every day | ORAL | 0 refills | Status: DC

## 2022-01-13 MED ORDER — TRAZODONE HCL 100 MG PO TABS: 100.0000 mg | ORAL_TABLET | Freq: Every day | ORAL | 1 refills | Status: DC

## 2022-01-13 MED ORDER — ESCITALOPRAM OXALATE 20 MG PO TABS: 20.0000 mg | ORAL_TABLET | Freq: Every day | ORAL | 1 refills | Status: DC

## 2022-01-13 MED ORDER — RISPERIDONE 2 MG PO TBDP: ORAL_TABLET | ORAL | 0 refills | Status: DC

## 2022-01-13 NOTE — Progress Notes (Addendum)
BH MD/PA/NP OP Progress Note  01/13/2022 1:08 PM Cheryl Adams  MRN:  [id number]  Chief Complaint:  Chief Complaint  Patient presents with   Medication Management    in person, f/u mm   Follow-up   HPI:   Cheryl Adams is a 31 year old female with a past psychiatric history significant for major depressive disorder with anxious distress, anxiety, and insomnia who presents to [facility] for follow-up and medication management.  Patient is currently being managed on the following medications:  Escitalopram 20 mg daily Trazodone 100 mg at bedtime Benztropine 0.5 mg 2 times daily Risperidone 2 mg daily/4 mg at bedtime  Patient reports that she is feeling okay and reports that she has run out of her risperidone prescription.  Due to improvements in her mood, patient would like to discontinue taking her medications.  Patient denies experiencing any depressive symptoms nor does she experience anxiety.  Patient denies any new stressors at this time and states that life is going well for her.  Patient is currently working at [employer] and has had no issues.  Patient is alert and oriented x4, calm, cooperative, and fully engaged in conversation during the encounter.  Patient endorses good mood and rates her mood an 8 out of 10.  Patient denies suicidal or homicidal ideations.  She further denies auditory or visual hallucinations and does not appear to be responding to internal/external stimuli.  Patient endorses good sleep and receives on average 7 hours of sleep each night.  She reports that she occasionally has to use her trazodone for sleep.  Patient endorses good appetite and eats on average 2 meals per day.  Patient denies alcohol consumption, tobacco use, and illicit drug use.  Visit Diagnosis:    ICD-10-CM   1. Major depressive disorder, recurrent episode, moderate with anxious distress (HCC)  F33.1 risperiDONE (RISPERDAL M-TABS) 1 MG disintegrating  tablet    risperiDONE (RISPERDAL M-TABS) 2 MG disintegrating tablet    escitalopram (LEXAPRO) 20 MG tablet    2. Anxiety state  F41.1 escitalopram (LEXAPRO) 20 MG tablet    3. Insomnia, unspecified type  G47.00 traZODone (DESYREL) 100 MG tablet      Past Psychiatric History:  Major depressive disorder Anxiety Insomnia  Past Medical History:  Past Medical History:  Diagnosis Date   Anemia    Chronic tonsillitis 10/2014   Cough 11/09/2014   Difficulty swallowing pills     Past Surgical History:  Procedure Laterality Date   TONSILLECTOMY AND ADENOIDECTOMY Bilateral 11/14/2014   Procedure: BILATERAL TONSILLECTOMY AND ADENOIDECTOMY;  Surgeon: Jodi Marble, MD;  Location: [place] [facility];  Service: ENT;  Laterality: Bilateral;   TYMPANOSTOMY TUBE PLACEMENT      Family Psychiatric History:  Patient is unsure of psychiatric history  Family History: History reviewed. No pertinent family history.  Social History:  Social History   Socioeconomic History   Marital status: Single    Spouse name: Not on file   Number of children: Not on file   Years of education: Not on file   Highest education level: Not on file  Occupational History   Not on file  Tobacco Use   Smoking status: Never   Smokeless tobacco: Never  Substance and Sexual Activity   Alcohol use: No   Drug use: No   Sexual activity: Not on file  Other Topics Concern   Not on file  Social History Narrative   Not on file   Social  Determinants of Health   Financial Resource Strain: Not on file  Food Insecurity: Not on file  Transportation Needs: Not on file  Physical Activity: Not on file  Stress: Not on file  Social Connections: Not on file    Allergies: No Known Allergies  Metabolic Disorder Labs: Lab Results  Component Value Date   HGBA1C 5.8 (H) 04/29/2021   MPG 120 04/29/2021   No results found for: PROLACTIN Lab Results  Component Value Date   CHOL 168 05/02/2021   TRIG 41  05/02/2021   HDL 66 05/02/2021   CHOLHDL 2.5 05/02/2021   VLDL 8 05/02/2021   LDLCALC 94 05/02/2021   LDLCALC 92 04/29/2021   Lab Results  Component Value Date   TSH 1.052 04/29/2021   TSH 1.352 02/26/2015    Therapeutic Level Labs: No results found for: LITHIUM No results found for: VALPROATE No components found for:  CBMZ  Current Medications: Current Outpatient Medications  Medication Sig Dispense Refill   benztropine (COGENTIN) 0.5 MG tablet Take 1 tablet (0.5 mg total) by mouth 2 (two) times daily as needed for tremors (eps). 60 tablet 1   escitalopram (LEXAPRO) 20 MG tablet Take 1 tablet (20 mg total) by mouth daily. 30 tablet 1   ferrous sulfate 325 (65 FE) MG tablet Take 325 mg by mouth daily.     LORazepam (ATIVAN) 0.5 MG tablet Take 1 tablet (0.5 mg total) by mouth 3 (three) times daily. 21 tablet 0   risperiDONE (RISPERDAL M-TABS) 1 MG disintegrating tablet Take 1 tablet (1 mg total) by mouth daily. Patient to take 1 tablet daily (1 mg total) for 21 days before discontinuing 21 tablet 0   risperiDONE (RISPERDAL M-TABS) 2 MG disintegrating tablet Patient to take 1 tablet (2 mg total) at bedtime for a week then take half tablet (1 mg total) at bedtime for a week before discontinuing . 11 tablet 0   traZODone (DESYREL) 100 MG tablet Take 1 tablet (100 mg total) by mouth at bedtime. 30 tablet 1   No current facility-administered medications for this visit.     Musculoskeletal: Strength & Muscle Tone: within normal limits Gait & Station: normal Patient leans: N/A  Psychiatric Specialty Exam: Review of Systems  Psychiatric/Behavioral:  Negative for decreased concentration, dysphoric mood, hallucinations, self-injury, sleep disturbance and suicidal ideas. The patient is not nervous/anxious and is not hyperactive.    Blood pressure 109/66, pulse 64, height 5\' 5"  (1.651 m), weight 243 lb (110.2 kg).Body mass index is 40.44 kg/m.  General Appearance: Well Groomed  Eye  Contact:  Good  Speech:  Clear and Coherent and Normal Rate  Volume:  Normal  Mood:  Euthymic  Affect:  Appropriate  Thought Process:  Coherent, Goal Directed, and Descriptions of Associations: Intact  Orientation:  Full (Time, Place, and Person)  Thought Content: WDL   Suicidal Thoughts:  No  Homicidal Thoughts:  No  Memory:  Immediate;   Good Recent;   Good Remote;   Fair  Judgement:  Good  Insight:  Good  Psychomotor Activity:  Normal  Concentration:  Concentration: Good and Attention Span: Good  Recall:  Good  Fund of Knowledge: Fair  Language: Good  Akathisia:  No  Handed:  Right  AIMS (if indicated): not done  Assets:  Communication Skills Desire for Improvement Housing Social Support  ADL's:  Intact  Cognition: WNL  Sleep:  Good   Screenings: AIMS    Flowsheet Row [facility]  AIMS Total Score 0      GAD-7    Flowsheet Row Clinical Support from 01/13/2022 in [facility] Clinical Support from 11/06/2021 in [facility] Video Visit from 06/26/2021 in [facility] Office Visit from 05/14/2021 in [facility] Counselor from 04/02/2021 in [facility]  Total GAD-7 Score 0 7 0 0 17      PHQ2-9    Flowsheet Row Clinical Support from 01/13/2022 in [facility] Clinical Support from 11/06/2021 in [facility] Video Visit from 06/26/2021 in [facility] Office Visit from 05/14/2021 in [facility] Counselor from 04/02/2021 in [facility]  PHQ-2 Total Score 0 2 0 0 4  PHQ-9 Total Score -- 9 -- -- 20      Flowsheet Row Clinical Support from 01/13/2022 in [place] from 11/06/2021 in  [facility] ED from 10/16/2021 in [place] No Risk No Risk No Risk        Assessment and Plan:   Cheryl Adams is a 31 year old female with a past psychiatric history significant for major depressive disorder with anxious distress, anxiety, and insomnia who presents to [facility] for follow-up and medication management.  Patient reports that she is doing well on her medications and has not been experiencing any depression or anxiety.  Patient would like to discontinue taking her medications.  Provider recommended discontinuing her medications gradually and one at a time.  Patient to taper off from risperidone.  Patient to take 1 mg of risperidone for 3 weeks while taking risperidone 2 mg at bedtime for 1 week followed by 1 mg for 1 more week before discontinuing.  Provider informed patient that instructions to discontinue her risperidone will be written on her prescription bottle.  Patient vocalized understanding.  Patient's medications to be e-prescribed to pharmacy of choice.  1. Major depressive disorder, recurrent episode, moderate with anxious distress (HCC)  - risperiDONE (RISPERDAL M-TABS) 1 MG disintegrating tablet; Take 1 tablet (1 mg total) by mouth daily. Patient to take 1 tablet daily (1 mg total) for 21 days before discontinuing  Dispense: 21 tablet; Refill: 0 - risperiDONE (RISPERDAL M-TABS) 2 MG disintegrating tablet; Patient to take 1 tablet (2 mg total) at bedtime for a week then take half tablet (1 mg total) at bedtime for a week before discontinuing .  Dispense: 11 tablet; Refill: 0 - escitalopram (LEXAPRO) 20 MG tablet; Take 1 tablet (20 mg total) by mouth daily.  Dispense: 30 tablet; Refill: 1  2. Anxiety state  - escitalopram (LEXAPRO) 20 MG tablet; Take 1 tablet (20 mg total) by mouth daily.  Dispense: 30 tablet; Refill: 1  3. Insomnia,  unspecified type  - traZODone (DESYREL) 100 MG tablet; Take 1 tablet (100 mg total) by mouth at bedtime.  Dispense: 30 tablet; Refill: 1  Patient to follow up in 6 weeks Provider spent a total of 16 minutes with the patient/reviewing patient's chart  Malachy Mood, PA 01/13/2022, 1:08 PM

## 2022-01-16 ENCOUNTER — Other Ambulatory Visit (HOSPITAL_COMMUNITY): Payer: Self-pay | Admitting: Physician Assistant

## 2022-01-16 DIAGNOSIS — F331 Major depressive disorder, recurrent, moderate: Secondary | ICD-10-CM

## 2022-02-24 ENCOUNTER — Encounter (HOSPITAL_COMMUNITY): Payer: Self-pay | Admitting: Physician Assistant

## 2022-02-24 ENCOUNTER — Other Ambulatory Visit: Payer: Self-pay

## 2022-02-24 ENCOUNTER — Ambulatory Visit (INDEPENDENT_AMBULATORY_CARE_PROVIDER_SITE_OTHER): Payer: 59 | Admitting: Physician Assistant

## 2022-02-24 VITALS — BP 132/58 | HR 66 | Ht 65.0 in | Wt 262.0 lb

## 2022-02-24 DIAGNOSIS — F411 Generalized anxiety disorder: Secondary | ICD-10-CM

## 2022-02-24 DIAGNOSIS — F331 Major depressive disorder, recurrent, moderate: Secondary | ICD-10-CM

## 2022-02-24 MED ORDER — ESCITALOPRAM OXALATE 10 MG PO TABS: ORAL_TABLET | ORAL | 0 refills | Status: DC

## 2022-02-24 NOTE — Progress Notes (Signed)
BH MD/PA/NP OP Progress Note ? ?02/24/2022 6:47 PM ?Cheryl Adams  ?MRN:  [id number] ? ?Chief Complaint:  ?Chief Complaint  ?Patient presents with  ? Medication Management  ? ?HPI:  ? ?Cheryl Adams is a 31 year old female with a past psychiatric history significant for major depressive disorder with anxious distress, anxiety, and insomnia who presents to [facility] for follow-up and medication management.  Patient is currently taking the following medications: ? ?Trazodone 100 mg at bedtime ?Escitalopram 20 mg daily ? ?Patient reports that she has successfully tapered off risperidone.  She reports that she has been doing pretty well without the medication and denies experiencing any resurgence of symptoms.  Patient reports that she has also not been using trazodone and has been able to sleep well without the medication.  Patient is currently taking Lexapro 20 mg daily.  Patient denies depressive symptoms or anxiety.  Patient further denies any new stressors at this time. ? ?Patient is alert and oriented x4, calm, cooperative, and fully engaged in conversation during the encounter.  Patient endorses good mood and states that she has been walking more.  Patient denies suicidal or homicidal ideations.  She further denies auditory or visual hallucinations and does not appear to be responding to internal/external stimuli.  Patient endorses good sleep and receives on average 8 hours of sleep each night.  Patient endorses good appetite and eats on average 3 meals per day.  Patient denies alcohol consumption, tobacco use, and illicit drug use. ? ?Visit Diagnosis:  ?  ICD-10-CM   ?1. Major depressive disorder, recurrent episode, moderate with anxious distress (HCC)  F33.1 escitalopram (LEXAPRO) 10 MG tablet  ?  ?2. Anxiety state  F41.1 escitalopram (LEXAPRO) 10 MG tablet  ?  ? ? ?Past Psychiatric History:  ?Major depressive disorder ?Anxiety ?Insomnia ? ?Past Medical History:  ?Past  Medical History:  ?Diagnosis Date  ? Anemia   ? Chronic tonsillitis 10/2014  ? Cough 11/09/2014  ? Difficulty swallowing pills   ?  ?Past Surgical History:  ?Procedure Laterality Date  ? TONSILLECTOMY AND ADENOIDECTOMY Bilateral 11/14/2014  ? Procedure: BILATERAL TONSILLECTOMY AND ADENOIDECTOMY;  Surgeon: Flo Shanks, MD;  Location: [facility];  Service: ENT;  Laterality: Bilateral;  ? TYMPANOSTOMY TUBE PLACEMENT    ? ? ?Family Psychiatric History:  ?Patient is unsure of psychiatric history ? ?Family History: No family history on file. ? ?Social History:  ?Social History  ? ?Socioeconomic History  ? Marital status: Single  ?  Spouse name: Not on file  ? Number of children: Not on file  ? Years of education: Not on file  ? Highest education level: Not on file  ?Occupational History  ? Not on file  ?Tobacco Use  ? Smoking status: Never  ? Smokeless tobacco: Never  ?Substance and Sexual Activity  ? Alcohol use: No  ? Drug use: No  ? Sexual activity: Not on file  ?Other Topics Concern  ? Not on file  ?Social History Narrative  ? Not on file  ? ?Social Determinants of Health  ? ?Financial Resource Strain: Not on file  ?Food Insecurity: Not on file  ?Transportation Needs: Not on file  ?Physical Activity: Not on file  ?Stress: Not on file  ?Social Connections: Not on file  ? ? ?Allergies: No Known Allergies ? ?Metabolic Disorder Labs: ?Lab Results  ?Component Value Date  ? HGBA1C 5.8 (H) 04/29/2021  ? MPG 120 04/29/2021  ? ?No results found for:  PROLACTIN ?Lab Results  ?Component Value Date  ? CHOL 168 05/02/2021  ? TRIG 41 05/02/2021  ? HDL 66 05/02/2021  ? CHOLHDL 2.5 05/02/2021  ? VLDL 8 05/02/2021  ? LDLCALC 94 05/02/2021  ? LDLCALC 92 04/29/2021  ? ?Lab Results  ?Component Value Date  ? TSH 1.052 04/29/2021  ? TSH 1.352 02/26/2015  ? ? ?Therapeutic Level Labs: ?No results found for: LITHIUM ?No results found for: VALPROATE ?No components found for:  CBMZ ? ?Current Medications: ?Current Outpatient  Medications  ?Medication Sig Dispense Refill  ? escitalopram (LEXAPRO) 10 MG tablet Take 1 tablet (10 mg total) by mouth daily for 3 days, THEN 0.5 tablets (5 mg total) daily for 4 days. 5 tablet 0  ? benztropine (COGENTIN) 0.5 MG tablet Take 1 tablet (0.5 mg total) by mouth 2 (two) times daily as needed for tremors (eps). 60 tablet 1  ? ferrous sulfate 325 (65 FE) MG tablet Take 325 mg by mouth daily.    ? LORazepam (ATIVAN) 0.5 MG tablet Take 1 tablet (0.5 mg total) by mouth 3 (three) times daily. 21 tablet 0  ? risperiDONE (RISPERDAL M-TABS) 1 MG disintegrating tablet DISSOLVE 1 TABLET(1 MG) ON THE TONGUE DAILY 21 tablet 0  ? risperiDONE (RISPERDAL M-TABS) 2 MG disintegrating tablet Patient to take 1 tablet (2 mg total) at bedtime for a week then take half tablet (1 mg total) at bedtime for a week before discontinuing . 11 tablet 0  ? ?No current facility-administered medications for this visit.  ? ? ? ?Musculoskeletal: ?Strength & Muscle Tone: within normal limits ?Gait & Station: normal ?Patient leans: N/A ? ?Psychiatric Specialty Exam: ?Review of Systems  ?Psychiatric/Behavioral:  Negative for decreased concentration, dysphoric mood, hallucinations, self-injury, sleep disturbance and suicidal ideas. The patient is not nervous/anxious and is not hyperactive.    ?Blood pressure (!) 132/58, pulse 66, height 5\' 5"  (1.651 m), weight 262 lb (118.8 kg), SpO2 100 %.Body mass index is 43.6 kg/m?.  ?General Appearance: Well Groomed  ?Eye Contact:  Good  ?Speech:  Clear and Coherent and Normal Rate  ?Volume:  Normal  ?Mood:  Euthymic  ?Affect:  Appropriate  ?Thought Process:  Coherent, Goal Directed, and Descriptions of Associations: Intact  ?Orientation:  Full (Time, Place, and Person)  ?Thought Content: WDL   ?Suicidal Thoughts:  No  ?Homicidal Thoughts:  No  ?Memory:  Immediate;   Good ?Recent;   Good ?Remote;   Fair  ?Judgement:  Good  ?Insight:  Good  ?Psychomotor Activity:  Normal  ?Concentration:  Concentration:  Good and Attention Span: Good  ?Recall:  Good  ?Fund of Knowledge: Fair  ?Language: Good  ?Akathisia:  No  ?Handed:  Right  ?AIMS (if indicated): not done  ?Assets:  Communication Skills ?Desire for Improvement ?Housing ?Social Support  ?ADL's:  Intact  ?Cognition: WNL  ?Sleep:  Good  ? ?Screenings: ?AIMS   ? ?Flowsheet Row [facility]  ?AIMS Total Score 0  ? ?  ? ?GAD-7   ? ?Flowsheet Row [facility]  ?Total GAD-7 Score 0 0 7 0 0  ? ?  ? ?PHQ2-9   ? ?Flowsheet Row Office Visit from  02/24/2022 in [facility] Office Visit from 01/13/2022 in [facility] Clinical Support from 11/06/2021 in [facility] Video Visit from 06/26/2021 in [facility] Office Visit from 05/14/2021 in [facility]  ?PHQ-2 Total Score 0 0 2 0 0  ?PHQ-9 Total Score -- -- 9 -- --  ? ?  ? ?Flowsheet Row Office Visit from 02/24/2022 in [facility] Office Visit from 01/13/2022 in [facility] Clinical Support from 11/06/2021 in [facility]  ?C-SSRS RISK CATEGORY No Risk No Risk No Risk  ? ?  ? ? ? ?Assessment and Plan:  ? ?Cheryl Adams is a 31 year old female with a past psychiatric history significant for major depressive disorder with anxious distress, anxiety, and insomnia who presents to [facility] for follow-up and medication management.  Patient has successfully  discontinued risperidone and denies experiencing any reoccurrence of symptoms.  Patient has also discontinued taking trazodone and reports that she does not need to take the medication on order to sleep.  Patient

## 2022-04-14 ENCOUNTER — Encounter (HOSPITAL_COMMUNITY): Payer: Self-pay | Admitting: Physician Assistant

## 2022-04-14 ENCOUNTER — Ambulatory Visit (INDEPENDENT_AMBULATORY_CARE_PROVIDER_SITE_OTHER): Payer: 59 | Admitting: Physician Assistant

## 2022-04-14 VITALS — BP 111/57 | HR 61 | Ht 65.0 in | Wt 268.0 lb

## 2022-04-14 DIAGNOSIS — Z0389 Encounter for observation for other suspected diseases and conditions ruled out: Secondary | ICD-10-CM | POA: Insufficient documentation

## 2022-04-14 HISTORY — DX: Encounter for observation for other suspected diseases and conditions ruled out: Z03.89

## 2022-04-14 NOTE — Progress Notes (Addendum)
BH MD/PA/NP OP Progress Note ? ?04/14/2022 11:06 AM ?Cheryl Adams  ?MRN:  [id number] ? ?Chief Complaint:  ?Chief Complaint  ?Patient presents with  ? Medication Management  ?  in person, f/u mm  ? ?HPI:  ? ?Cheryl Adams is a 31 year old female with a past psychiatric history significant for major depressive disorder with anxious distress, anxiety, and insomnia who presents to [facility] for follow-up and medication management.  Patient was last seen by this provider on 02/24/2022.  During the conclusion of her last encounter, patient was given the instruction to taper off her escitalopram due denying progressive depressive symptoms. ? ?Patient reports that things have been going well for her.  Patient has not noticed any drastic changes in her mood since successfully tapering off her escitalopram.  Patient denies experiencing depressive symptoms and she further denies anxiety.  Patient denies any major changes in her life and denies any new stressors at this time.  Both PHQ-9 and GAD-7 screens were unremarkable with the patient scoring 0. ? ?Patient is alert and oriented x4, pleasant, calm, cooperative, and fully engaged in conversation during the encounter.  Patient endorses normal mood and states that she is sleepy but happy.  Patient denies suicidal or homicidal ideations.  She further denies auditory or visual hallucinations and does not appear to be responding to internal/external stimuli.  Patient endorses good sleep and receives on average 8 hours of sleep each night.  Patient endorses good appetite and eats on average 3 meals per day.  Patient denies alcohol consumption, tobacco use, and illicit drug use. ? ?Visit Diagnosis:  ?  ICD-10-CM   ?1. No psychiatric disorder found after evaluation  Z03.89   ?  ? ? ? ?Past Psychiatric History:  ?Major depressive disorder ?Anxiety ?Insomnia ? ?Past Medical History:  ?Past Medical History:  ?Diagnosis Date  ? Anemia   ? Chronic  tonsillitis 10/2014  ? Cough 11/09/2014  ? Difficulty swallowing pills   ?  ?Past Surgical History:  ?Procedure Laterality Date  ? TONSILLECTOMY AND ADENOIDECTOMY Bilateral 11/14/2014  ? Procedure: BILATERAL TONSILLECTOMY AND ADENOIDECTOMY;  Surgeon: Flo Shanks, MD;  Location: [facility];  Service: ENT;  Laterality: Bilateral;  ? TYMPANOSTOMY TUBE PLACEMENT    ? ? ?Family Psychiatric History:  ?Patient is unsure of psychiatric history ? ?Family History: History reviewed. No pertinent family history. ? ?Social History:  ?Social History  ? ?Socioeconomic History  ? Marital status: Single  ?  Spouse name: Not on file  ? Number of children: Not on file  ? Years of education: Not on file  ? Highest education level: Not on file  ?Occupational History  ? Not on file  ?Tobacco Use  ? Smoking status: Never  ? Smokeless tobacco: Never  ?Substance and Sexual Activity  ? Alcohol use: No  ? Drug use: No  ? Sexual activity: Not on file  ?Other Topics Concern  ? Not on file  ?Social History Narrative  ? Not on file  ? ?Social Determinants of Health  ? ?Financial Resource Strain: Not on file  ?Food Insecurity: Not on file  ?Transportation Needs: Not on file  ?Physical Activity: Not on file  ?Stress: Not on file  ?Social Connections: Not on file  ? ? ?Allergies: No Known Allergies ? ?Metabolic Disorder Labs: ?Lab Results  ?Component Value Date  ? HGBA1C 5.8 (H) 04/29/2021  ? MPG 120 04/29/2021  ? ?No results found for: PROLACTIN ?Lab Results  ?  Component Value Date  ? CHOL 168 05/02/2021  ? TRIG 41 05/02/2021  ? HDL 66 05/02/2021  ? CHOLHDL 2.5 05/02/2021  ? VLDL 8 05/02/2021  ? LDLCALC 94 05/02/2021  ? LDLCALC 92 04/29/2021  ? ?Lab Results  ?Component Value Date  ? TSH 1.052 04/29/2021  ? TSH 1.352 02/26/2015  ? ? ?Therapeutic Level Labs: ?No results found for: LITHIUM ?No results found for: VALPROATE ?No components found for:  CBMZ ? ?Current Medications: ?Current Outpatient Medications  ?Medication Sig Dispense  Refill  ? benztropine (COGENTIN) 0.5 MG tablet Take 1 tablet (0.5 mg total) by mouth 2 (two) times daily as needed for tremors (eps). 60 tablet 1  ? ferrous sulfate 325 (65 FE) MG tablet Take 325 mg by mouth daily.    ? LORazepam (ATIVAN) 0.5 MG tablet Take 1 tablet (0.5 mg total) by mouth 3 (three) times daily. 21 tablet 0  ? risperiDONE (RISPERDAL M-TABS) 1 MG disintegrating tablet DISSOLVE 1 TABLET(1 MG) ON THE TONGUE DAILY 21 tablet 0  ? risperiDONE (RISPERDAL M-TABS) 2 MG disintegrating tablet Patient to take 1 tablet (2 mg total) at bedtime for a week then take half tablet (1 mg total) at bedtime for a week before discontinuing . 11 tablet 0  ? escitalopram (LEXAPRO) 10 MG tablet Take 1 tablet (10 mg total) by mouth daily for 3 days, THEN 0.5 tablets (5 mg total) daily for 4 days. 5 tablet 0  ? ?No current facility-administered medications for this visit.  ? ? ? ?Musculoskeletal: ?Strength & Muscle Tone: within normal limits ?Gait & Station: normal ?Patient leans: N/A ? ?Psychiatric Specialty Exam: ?Review of Systems  ?Psychiatric/Behavioral:  Negative for decreased concentration, dysphoric mood, hallucinations, self-injury, sleep disturbance and suicidal ideas. The patient is not nervous/anxious and is not hyperactive.    ?Blood pressure (!) 111/57, pulse 61, height 5\' 5"  (1.651 m), weight 268 lb (121.6 kg).Body mass index is 44.6 kg/m?.  ?General Appearance: Well Groomed  ?Eye Contact:  Good  ?Speech:  Clear and Coherent and Normal Rate  ?Volume:  Normal  ?Mood:  Euthymic  ?Affect:  Appropriate  ?Thought Process:  Coherent, Goal Directed, and Descriptions of Associations: Intact  ?Orientation:  Full (Time, Place, and Person)  ?Thought Content: WDL   ?Suicidal Thoughts:  No  ?Homicidal Thoughts:  No  ?Memory:  Immediate;   Good ?Recent;   Good ?Remote;   Fair  ?Judgement:  Good  ?Insight:  Good  ?Psychomotor Activity:  Normal  ?Concentration:  Concentration: Good and Attention Span: Good  ?Recall:  Good   ?Fund of Knowledge: Fair  ?Language: Good  ?Akathisia:  No  ?Handed:  Right  ?AIMS (if indicated): not done  ?Assets:  Communication Skills ?Desire for Improvement ?Housing ?Social Support  ?ADL's:  Intact  ?Cognition: WNL  ?Sleep:  Good  ? ?Screenings: ?AIMS   ? ?Flowsheet Row [facility]  ?AIMS Total Score 0  ? ?  ? ?GAD-7   ? ?Flowsheet Row Office Visit from 04/14/2022 in [facility] Office Visit from 02/24/2022 in [facility] Office Visit from 01/13/2022 in [facility] Clinical Support from 11/06/2021 in [facility] Video Visit from 06/26/2021 in [facility]  ?Total GAD-7 Score 0 0 0 7 0  ? ?  ? ?PHQ2-9   ? ?Flowsheet Row Office Visit from 04/14/2022 in [facility]  [facility] Support from [facility]  ?PHQ-2 Total Score 0 0 0 2 0  ?PHQ-9 Total Score -- -- -- 9 --  ? ?  ? ?Flowsheet Row [facility]  ?C-SSRS RISK CATEGORY No Risk No Risk No Risk  ? ?  ? ? ? ?Assessment and Plan:  ? ?Cheryl MuscatEmily R. Adams is a 31 year old female with a past psychiatric history significant for major depressive disorder with anxious distress, anxiety, and insomnia who [facility]up and medication management.  Patient has successfully discontinued taking escitalopram.  Patient denies any  major changes in her mood since tapering off all her medications.  Patient denies depressive symptoms nor does she endorse anxiety.  Patient continues to appear stable. ? ?Collaboration of Care: Collaboration

## 2022-05-18 ENCOUNTER — Ambulatory Visit (HOSPITAL_COMMUNITY)
Admission: EM | Admit: 2022-05-18 | Discharge: 2022-05-19 | Disposition: A | Discharge status: Other Acute Inpatient Hospital | Payer: 59 | Attending: Family | Admitting: Family

## 2022-05-18 DIAGNOSIS — Z20822 Contact with and (suspected) exposure to covid-19: Secondary | ICD-10-CM | POA: Insufficient documentation

## 2022-05-18 DIAGNOSIS — F411 Generalized anxiety disorder: Secondary | ICD-10-CM | POA: Insufficient documentation

## 2022-05-18 DIAGNOSIS — F320 Major depressive disorder, single episode, mild: Secondary | ICD-10-CM | POA: Diagnosis not present

## 2022-05-18 DIAGNOSIS — F259 Schizoaffective disorder, unspecified: Secondary | ICD-10-CM

## 2022-05-18 DIAGNOSIS — Z79899 Other long term (current) drug therapy: Secondary | ICD-10-CM | POA: Diagnosis not present

## 2022-05-18 DIAGNOSIS — F250 Schizoaffective disorder, bipolar type: Secondary | ICD-10-CM | POA: Diagnosis not present

## 2022-05-18 DIAGNOSIS — R4689 Other symptoms and signs involving appearance and behavior: Secondary | ICD-10-CM

## 2022-05-18 DIAGNOSIS — F333 Major depressive disorder, recurrent, severe with psychotic symptoms: Secondary | ICD-10-CM

## 2022-05-18 LAB — LIPID PANEL
Cholesterol: 217 mg/dL — ABNORMAL HIGH (ref 0–200)
HDL: 71 mg/dL (ref >40)
LDL Cholesterol: 140 mg/dL — ABNORMAL HIGH (ref 0–99)
Total CHOL/HDL Ratio: 3.1 RATIO
Triglycerides: 31 mg/dL (ref <150)
VLDL: 6 mg/dL (ref 0–40)

## 2022-05-18 LAB — COMPREHENSIVE METABOLIC PANEL
ALT: 14 U/L (ref 0–44)
AST: 16 U/L (ref 15–41)
Albumin: 4.2 g/dL (ref 3.5–5.0)
Alkaline Phosphatase: 59 U/L (ref 38–126)
Anion gap: 10 (ref 5–15)
BUN: <5 mg/dL — ABNORMAL LOW (ref 6–20)
CO2: 26 mmol/L (ref 22–32)
Calcium: 9.2 mg/dL (ref 8.9–10.3)
Chloride: 101 mmol/L (ref 98–111)
Creatinine, Ser: 0.66 mg/dL (ref 0.44–1.00)
GFR, Estimated: >60 mL/min (ref >60)
Glucose, Bld: 146 mg/dL — ABNORMAL HIGH (ref 70–99)
Potassium: 3.3 mmol/L — ABNORMAL LOW (ref 3.5–5.1)
Sodium: 137 mmol/L (ref 135–145)
Total Bilirubin: 0.9 mg/dL (ref 0.3–1.2)
Total Protein: 7.9 g/dL (ref 6.5–8.1)

## 2022-05-18 LAB — POCT URINE DRUG SCREEN - MANUAL ENTRY (I-SCREEN)
POC Amphetamine UR: NOT DETECTED
POC Buprenorphine (BUP): NOT DETECTED
POC Cocaine UR: NOT DETECTED
POC Marijuana UR: NOT DETECTED
POC Methadone UR: NOT DETECTED
POC Methamphetamine UR: NOT DETECTED
POC Morphine: NOT DETECTED
POC Oxazepam (BZO): NOT DETECTED
POC Oxycodone UR: NOT DETECTED
POC Secobarbital (BAR): NOT DETECTED

## 2022-05-18 LAB — RESP PANEL BY RT-PCR (FLU A&B, COVID) ARPGX2
Influenza A by PCR: NEGATIVE
Influenza B by PCR: NEGATIVE
SARS Coronavirus 2 by RT PCR: NEGATIVE

## 2022-05-18 LAB — CBC WITH DIFFERENTIAL/PLATELET
Abs Immature Granulocytes: 0.05 10*3/uL (ref 0.00–0.07)
Basophils Absolute: 0 10*3/uL (ref 0.0–0.1)
Basophils Relative: 0 %
Eosinophils Absolute: 0 10*3/uL (ref 0.0–0.5)
Eosinophils Relative: 0 %
HCT: 44.5 % (ref 36.0–46.0)
Hemoglobin: 14.6 g/dL (ref 12.0–15.0)
Immature Granulocytes: 1 %
Lymphocytes Relative: 18 %
Lymphs Abs: 1.6 10*3/uL (ref 0.7–4.0)
MCH: 26.8 pg (ref 26.0–34.0)
MCHC: 32.8 g/dL (ref 30.0–36.0)
MCV: 81.7 fL (ref 80.0–100.0)
Monocytes Absolute: 0.4 10*3/uL (ref 0.1–1.0)
Monocytes Relative: 5 %
Neutro Abs: 6.7 10*3/uL (ref 1.7–7.7)
Neutrophils Relative %: 76 %
Platelets: 262 10*3/uL (ref 150–400)
RBC: 5.45 MIL/uL — ABNORMAL HIGH (ref 3.87–5.11)
RDW: 14.5 % (ref 11.5–15.5)
WBC: 8.8 10*3/uL (ref 4.0–10.5)
nRBC: 0 % (ref 0.0–0.2)

## 2022-05-18 LAB — HEMOGLOBIN A1C
Hgb A1c MFr Bld: 5.2 % (ref 4.8–5.6)
Mean Plasma Glucose: 102.54 mg/dL

## 2022-05-18 LAB — POCT PREGNANCY, URINE: Preg Test, Ur: NEGATIVE

## 2022-05-18 LAB — TSH: TSH: 1.768 u[IU]/mL (ref 0.350–4.500)

## 2022-05-18 LAB — POC SARS CORONAVIRUS 2 AG: SARSCOV2ONAVIRUS 2 AG: NEGATIVE

## 2022-05-18 MED ORDER — RISPERIDONE 2 MG PO TABS
2.0000 mg | ORAL_TABLET | Freq: Every day | ORAL | Status: DC
  Administered 2022-05-18: 2 mg via ORAL
  Filled 2022-05-18: qty 1

## 2022-05-18 MED ORDER — RISPERIDONE 2 MG PO TABS
2.0000 mg | ORAL_TABLET | Freq: Once | ORAL | Status: DC
  Administered 2022-05-18: 2 mg via ORAL
  Filled 2022-05-18: qty 1

## 2022-05-18 MED ORDER — ACETAMINOPHEN 325 MG PO TABS: 650.0000 mg | ORAL_TABLET | Freq: Four times a day (QID) | ORAL | Status: DC | PRN

## 2022-05-18 MED ORDER — RISPERIDONE 1 MG PO TBDP: 1.0000 mg | ORAL_TABLET | Freq: Every day | ORAL | Status: DC

## 2022-05-18 MED ORDER — ALUM & MAG HYDROXIDE-SIMETH 200-200-20 MG/5ML PO SUSP: 30.0000 mL | ORAL | Status: DC | PRN

## 2022-05-18 MED ORDER — RISPERIDONE 1 MG PO TABS
1.0000 mg | ORAL_TABLET | Freq: Every day | ORAL | Status: DC
  Administered 2022-05-19: 1 mg via ORAL
  Filled 2022-05-18: qty 1

## 2022-05-18 MED ORDER — MAGNESIUM HYDROXIDE 400 MG/5ML PO SUSP: 30.0000 mL | Freq: Every day | ORAL | Status: DC | PRN

## 2022-05-18 MED ORDER — TRAZODONE HCL 50 MG PO TABS
50.0000 mg | ORAL_TABLET | Freq: Every evening | ORAL | Status: DC | PRN
  Administered 2022-05-18: 50 mg via ORAL
  Filled 2022-05-18: qty 1

## 2022-05-18 NOTE — ED Notes (Signed)
Pt resting in no acute distress. RR even and unlabored. Safety maintained. 
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
## 2022-05-18 NOTE — ED Triage Notes (Signed)
Pt presents to [facility] accompanied by her mother seeking a mental health evaluation. Pts mother states that the patient told her that she needs to be committed. Pt states she feels like she needs to be committed into the hospital because she feels neglected by her mom and sister. Pt states she has not taken any medication for a while other then a sleeping pill that she cannot recall the name. Pt states that she has a psychiatrist that she sees upstairs in outpatient and has an appointment coming up but she cannot recall the date of the appointment. Pt states that she would like to get resources for therapy and family therapy for help with the relationship with her mother and sister. Pt denies SI/HI,NSSIB and AVH

## 2022-05-18 NOTE — ED Notes (Signed)
Pt sleeping at present, no distress noted. Respirations even & unlabored.  Monitoring for safety. 

## 2022-05-18 NOTE — BH Assessment (Signed)
LCSW Progress Note  LCSW provided several resources for individual and family therapy as she requested in the initial assessment.
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
## 2022-05-18 NOTE — Discharge Instructions (Addendum)
Take all medications as prescribed. Keep all follow-up appointments as scheduled.  Do not consume alcohol or use illegal drugs while on prescription medications. Report any adverse effects from your medications to your primary care provider promptly.  In the event of recurrent symptoms or worsening symptoms, call 911, a crisis hotline, or go to the nearest emergency department for evaluation.     OUTPATIENT THERAPY (INDIVIDUAL/FAMILY)  [facility] [address] [place] [place], [place] [address] [phone] office [email or web address]   You can go online to schedule an appointment.  [facility] (Outpatient) [address] [place], [place], [address] [phone] office  [facility] [address] [place], [place], [address] [phone] office [phone] fax  [facility] [address] [place], [place], [address] [phone] office [phone] fax  [facility] [address] [place], [place], [address] [phone] office [phone] fax [email or web address]

## 2022-05-18 NOTE — ED Notes (Signed)
Pt sleeping in no acute distress. RR even and unlabored. Safety maintained. 

## 2022-05-19 ENCOUNTER — Encounter (HOSPITAL_COMMUNITY): Payer: Self-pay | Admitting: Student

## 2022-05-19 ENCOUNTER — Other Ambulatory Visit: Payer: Self-pay

## 2022-05-19 ENCOUNTER — Inpatient Hospital Stay (HOSPITAL_COMMUNITY)
Admission: AD | Admit: 2022-05-19 | Discharge: 2022-09-10 | DRG: 885 | Disposition: A | Discharge status: Home / Self Care | Payer: 59 | Source: Intra-hospital | Attending: Psychiatry | Admitting: Psychiatry

## 2022-05-19 DIAGNOSIS — R4689 Other symptoms and signs involving appearance and behavior: Secondary | ICD-10-CM | POA: Diagnosis not present

## 2022-05-19 DIAGNOSIS — F79 Unspecified intellectual disabilities: Secondary | ICD-10-CM | POA: Diagnosis not present

## 2022-05-19 DIAGNOSIS — F259 Schizoaffective disorder, unspecified: Secondary | ICD-10-CM

## 2022-05-19 DIAGNOSIS — E559 Vitamin D deficiency, unspecified: Secondary | ICD-10-CM | POA: Diagnosis present

## 2022-05-19 DIAGNOSIS — G4700 Insomnia, unspecified: Secondary | ICD-10-CM | POA: Diagnosis present

## 2022-05-19 DIAGNOSIS — Z818 Family history of other mental and behavioral disorders: Secondary | ICD-10-CM

## 2022-05-19 DIAGNOSIS — F251 Schizoaffective disorder, depressive type: Secondary | ICD-10-CM | POA: Diagnosis not present

## 2022-05-19 DIAGNOSIS — K117 Disturbances of salivary secretion: Secondary | ICD-10-CM

## 2022-05-19 DIAGNOSIS — Z6839 Body mass index (BMI) 39.0-39.9, adult: Secondary | ICD-10-CM | POA: Diagnosis not present

## 2022-05-19 DIAGNOSIS — F333 Major depressive disorder, recurrent, severe with psychotic symptoms: Secondary | ICD-10-CM | POA: Diagnosis not present

## 2022-05-19 DIAGNOSIS — Z79899 Other long term (current) drug therapy: Secondary | ICD-10-CM | POA: Diagnosis not present

## 2022-05-19 DIAGNOSIS — F064 Anxiety disorder due to known physiological condition: Secondary | ICD-10-CM | POA: Diagnosis present

## 2022-05-19 DIAGNOSIS — F250 Schizoaffective disorder, bipolar type: Secondary | ICD-10-CM | POA: Diagnosis not present

## 2022-05-19 DIAGNOSIS — I10 Essential (primary) hypertension: Secondary | ICD-10-CM | POA: Diagnosis present

## 2022-05-19 DIAGNOSIS — F600 Paranoid personality disorder: Secondary | ICD-10-CM | POA: Diagnosis present

## 2022-05-19 DIAGNOSIS — M436 Torticollis: Secondary | ICD-10-CM | POA: Diagnosis not present

## 2022-05-19 DIAGNOSIS — Z7984 Long term (current) use of oral hypoglycemic drugs: Secondary | ICD-10-CM | POA: Diagnosis not present

## 2022-05-19 DIAGNOSIS — F29 Unspecified psychosis not due to a substance or known physiological condition: Secondary | ICD-10-CM | POA: Diagnosis present

## 2022-05-19 DIAGNOSIS — Z20822 Contact with and (suspected) exposure to covid-19: Secondary | ICD-10-CM | POA: Diagnosis present

## 2022-05-19 DIAGNOSIS — F411 Generalized anxiety disorder: Secondary | ICD-10-CM | POA: Diagnosis present

## 2022-05-19 DIAGNOSIS — F061 Catatonic disorder due to known physiological condition: Secondary | ICD-10-CM | POA: Diagnosis not present

## 2022-05-19 DIAGNOSIS — I951 Orthostatic hypotension: Secondary | ICD-10-CM | POA: Diagnosis not present

## 2022-05-19 DIAGNOSIS — R630 Anorexia: Secondary | ICD-10-CM | POA: Diagnosis present

## 2022-05-19 DIAGNOSIS — F819 Developmental disorder of scholastic skills, unspecified: Secondary | ICD-10-CM | POA: Diagnosis present

## 2022-05-19 LAB — PROLACTIN: Prolactin: 135 ng/mL — ABNORMAL HIGH (ref 4.8–23.3)

## 2022-05-19 MED ORDER — RISPERIDONE 1 MG PO TABS
1.0000 mg | ORAL_TABLET | Freq: Every day | ORAL | Status: DC
  Administered 2022-05-20: 1 mg via ORAL
  Filled 2022-05-19 (×2): qty 1

## 2022-05-19 MED ORDER — RISPERIDONE 2 MG PO TABS: 2.0000 mg | ORAL_TABLET | Freq: Every day | ORAL | Status: DC

## 2022-05-19 MED ORDER — ZIPRASIDONE MESYLATE 20 MG IM SOLR: 20.0000 mg | INTRAMUSCULAR | Status: DC | PRN

## 2022-05-19 MED ORDER — LORAZEPAM 1 MG PO TABS: 1.0000 mg | ORAL_TABLET | ORAL | Status: DC | PRN

## 2022-05-19 MED ORDER — FERROUS SULFATE 325 (65 FE) MG PO TABS
325.0000 mg | ORAL_TABLET | Freq: Every day | ORAL | Status: DC
  Administered 2022-05-19 – 2022-09-10 (×114): 325 mg via ORAL
  Filled 2022-05-19 (×118): qty 1

## 2022-05-19 MED ORDER — OLANZAPINE 5 MG PO TBDP: 5.0000 mg | ORAL_TABLET | Freq: Three times a day (TID) | ORAL | Status: DC | PRN

## 2022-05-19 MED ORDER — RISPERIDONE 2 MG PO TABS
2.0000 mg | ORAL_TABLET | Freq: Every day | ORAL | Status: DC
  Administered 2022-05-19: 2 mg via ORAL
  Filled 2022-05-19 (×3): qty 1

## 2022-05-19 MED ORDER — RISPERIDONE 1 MG PO TABS: 1.0000 mg | ORAL_TABLET | Freq: Every day | ORAL | Status: DC

## 2022-05-19 MED ORDER — POTASSIUM CHLORIDE CRYS ER 20 MEQ PO TBCR
40.0000 meq | EXTENDED_RELEASE_TABLET | Freq: Once | ORAL | Status: AC
  Administered 2022-05-19: 40 meq via ORAL
  Filled 2022-05-19 (×2): qty 2

## 2022-05-19 NOTE — ED Notes (Signed)
Pt is resting quietly, in no acute distress at this time. Will con't to monitor.
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
## 2022-05-19 NOTE — Progress Notes (Signed)
Lura was transported via [employer] to [facility]. The required paperwork was sent with her and no belongings per [employer].

## 2022-05-19 NOTE — Tx Team (Signed)
Initial Treatment Plan 05/19/2022 1:48 PM Cheryl Adams HBZ:[id number]    PATIENT STRESSORS: Health problems   Marital or family conflict   Medication change or noncompliance     PATIENT STRENGTHS: Average or above average intelligence  Supportive family/friends    PATIENT IDENTIFIED PROBLEMS: "Work on my anxiety and depression"  "To be sociable"  Medication noncompliance  Anxiety  Depression  Paranoia           DISCHARGE CRITERIA:  Ability to meet basic life and health needs Adequate post-discharge living arrangements Motivation to continue treatment in a less acute level of care  PRELIMINARY DISCHARGE PLAN: Attend aftercare/continuing care group Outpatient therapy Return to previous living arrangement  PATIENT/FAMILY INVOLVEMENT: This treatment plan has been presented to and reviewed with the patient, Cheryl Adams, and/or family member.  The patient and family have been given the opportunity to ask questions and make suggestions.  Clarene Critchley, RN 05/19/2022, 1:48 PM

## 2022-05-19 NOTE — Progress Notes (Signed)
BHH Group Notes:  (Nursing/MHT/Case Management/Adjunct)  Date:  05/19/2022  Time:  2015 Type of Therapy:   wrap up group  Participation Level:  Active  Participation Quality:  Appropriate, Attentive, Sharing, and Supportive  Affect:  Depressed and guarded  Cognitive:  Alert  Insight:  Improving  Engagement in Group:  Engaged  Modes of Intervention:  Clarification, Education, and Support  Summary of Progress/Problems: Positive thinking and self-care were discussed.   Marcille Buffy 05/19/2022, 9:24 PM

## 2022-05-19 NOTE — Progress Notes (Signed)
Pt was accepted to [facility] 05/19/2022; Bed Assignment 407-1 at 1300.  Pt meets inpatient criteria per Princess Bruins, DO   Attending Physician will be Mason Jim MD  Report can be called to: Adult unit: [phone]  Pt can arrive after 1300  Care Team: Malva Limes, RN, Rona Ravens, RN, Princess Bruins, DO, Jake Samples, LCSW, Dellis Filbert, MD, Oliver Pila, Garvin Fila, RN, Ava Elisabeth Most Earlene Plater, MD.  Kelton Pillar, LCSWA 05/19/2022 @ 10:35 AM

## 2022-05-19 NOTE — ED Notes (Signed)
Pt sleeping at present, no distress noted.  Monitoring for safety. 

## 2022-05-19 NOTE — ED Notes (Signed)
Pt presents with anxious mood, affect congruent. Sweetie reports '' I am doing okay. I just came in because I got anxious and overwhelmed. I stopped taking the medication because I was better and I didn't think I needed it. But then started taking it back again, '' patient is very childlike in presentation. She denies any current SI or HI or A/ V Hallucinations. She reports she lives with mom and sister. She was offered breakfast and juice and denies any acute concerns.

## 2022-05-19 NOTE — Group Note (Deleted)
Group Topic: Communication  Group Date: 05/19/2022 Start Time: 0800 End Time: 0830 Facilitators: Emmit Pomfret D, NT  Department: [facility]  Number of Participants: 3  Group Focus: check in, co-dependency, communication, coping skills, and daily focus Treatment Modality:  Psychoeducation Interventions utilized were leisure development, problem solving, and support Purpose: enhance coping skills, express feelings, increase insight, regain self-worth, and reinforce self-care   Name: Cheryl Adams Date of Birth: 05-Aug-1991  MR: [id number]    Level of Participation: {THERAPIES; PSYCH GROUP PARTICIPATION NLGXQ:11941} Quality of Participation: {THERAPIES; PSYCH QUALITY OF PARTICIPATION:23992} Interactions with others: {THERAPIES; PSYCH INTERACTIONS:23993} Mood/Affect: {THERAPIES; PSYCH MOOD/AFFECT:23994} Triggers (if applicable): *** Cognition: {THERAPIES; PSYCH COGNITION:23995} Progress: {THERAPIES; PSYCH PROGRESS:23997} Response: *** Plan: {THERAPIES; PSYCH DEYC:14481}  Patients Problems:  Patient Active Problem List   Diagnosis Date Noted   No psychiatric disorder found after evaluation 04/14/2022   Psychosis (HCC) 10/17/2021   MDD (major depressive disorder), recurrent, severe, with psychosis (HCC)    Aggressive behavior    Insomnia 05/17/2021   Major depressive disorder, recurrent episode, moderate with anxious distress (HCC) 05/17/2021   Anxiety state 05/17/2021   MDD (major depressive disorder), recurrent, with catatonic features (HCC) 04/28/2021   MDD (major depressive disorder), single episode with catatonic features 04/27/2021   Odynophagia 07/07/2016   Iron deficiency anemia 02/26/2015   S/P tonsillectomy and adenoidectomy 02/26/2015   Healthcare maintenance 02/26/2015

## 2022-05-19 NOTE — Progress Notes (Signed)
Admission Note: Patient is a 30 year old female admitted to the unit voluntarily from [facility] for symptoms of paranoia, aggression and medication noncompliance. Patient is alert and oriented x 4.  Patient presents with anxious mood and affect.  Stated she is here to work on her anxiety and depression.  Reports feeling unsafe at home due to conflict with her mother and sister.  Admission plan of care reviewed, consent signed.  Skin assessment completed.  Skin is dry and intact.  No contraband found.  Patient oriented to the unit, staff and room.  Routine safety checks initiated.  Verbalizes understanding of unit rules/protocols.  Patient is safe on the unit.

## 2022-05-20 ENCOUNTER — Encounter (HOSPITAL_COMMUNITY): Payer: Self-pay

## 2022-05-20 DIAGNOSIS — F333 Major depressive disorder, recurrent, severe with psychotic symptoms: Secondary | ICD-10-CM

## 2022-05-20 DIAGNOSIS — Z20822 Contact with and (suspected) exposure to covid-19: Secondary | ICD-10-CM | POA: Diagnosis not present

## 2022-05-20 DIAGNOSIS — F251 Schizoaffective disorder, depressive type: Secondary | ICD-10-CM | POA: Diagnosis not present

## 2022-05-20 DIAGNOSIS — I10 Essential (primary) hypertension: Secondary | ICD-10-CM | POA: Diagnosis not present

## 2022-05-20 DIAGNOSIS — F061 Catatonic disorder due to known physiological condition: Secondary | ICD-10-CM | POA: Diagnosis not present

## 2022-05-20 LAB — COMPREHENSIVE METABOLIC PANEL
ALT: 14 U/L (ref 0–44)
AST: 13 U/L — ABNORMAL LOW (ref 15–41)
Albumin: 3.8 g/dL (ref 3.5–5.0)
Alkaline Phosphatase: 57 U/L (ref 38–126)
Anion gap: 6 (ref 5–15)
BUN: 7 mg/dL (ref 6–20)
CO2: 29 mmol/L (ref 22–32)
Calcium: 9.2 mg/dL (ref 8.9–10.3)
Chloride: 106 mmol/L (ref 98–111)
Creatinine, Ser: 0.78 mg/dL (ref 0.44–1.00)
GFR, Estimated: >60 mL/min (ref >60)
Glucose, Bld: 104 mg/dL — ABNORMAL HIGH (ref 70–99)
Potassium: 4.1 mmol/L (ref 3.5–5.1)
Sodium: 141 mmol/L (ref 135–145)
Total Bilirubin: 0.7 mg/dL (ref 0.3–1.2)
Total Protein: 7.3 g/dL (ref 6.5–8.1)

## 2022-05-20 LAB — MAGNESIUM: Magnesium: 2.1 mg/dL (ref 1.7–2.4)

## 2022-05-20 MED ORDER — AMLODIPINE BESYLATE 5 MG PO TABS
5.0000 mg | ORAL_TABLET | Freq: Every day | ORAL | Status: DC
Start: 2022-05-20 — End: 2022-06-08
  Administered 2022-05-20 – 2022-06-08 (×20): 5 mg via ORAL
  Filled 2022-05-20 (×23): qty 1

## 2022-05-20 MED ORDER — ACETAMINOPHEN 500 MG PO TABS: 500.0000 mg | ORAL_TABLET | Freq: Four times a day (QID) | ORAL | Status: DC | PRN

## 2022-05-20 MED ORDER — LORAZEPAM 1 MG PO TABS
1.0000 mg | ORAL_TABLET | Freq: Three times a day (TID) | ORAL | Status: DC | PRN
  Administered 2022-07-03: 1 mg via ORAL
  Filled 2022-05-20: qty 1

## 2022-05-20 MED ORDER — ZIPRASIDONE MESYLATE 20 MG IM SOLR: 20.0000 mg | Freq: Three times a day (TID) | INTRAMUSCULAR | Status: DC | PRN

## 2022-05-20 MED ORDER — ZIPRASIDONE HCL 60 MG PO CAPS
60.0000 mg | ORAL_CAPSULE | Freq: Two times a day (BID) | ORAL | Status: DC
  Administered 2022-05-20 – 2022-05-23 (×6): 60 mg via ORAL
  Filled 2022-05-20 (×10): qty 1

## 2022-05-20 MED ORDER — TRAZODONE HCL 50 MG PO TABS
50.0000 mg | ORAL_TABLET | Freq: Every evening | ORAL | Status: DC | PRN
  Administered 2022-05-20 – 2022-05-21 (×2): 50 mg via ORAL
  Filled 2022-05-20 (×2): qty 1

## 2022-05-20 MED ORDER — OLANZAPINE 5 MG PO TBDP
5.0000 mg | ORAL_TABLET | Freq: Three times a day (TID) | ORAL | Status: DC | PRN
  Administered 2022-05-24: 5 mg via ORAL
  Filled 2022-05-20 (×2): qty 1

## 2022-05-20 MED ORDER — HYDROXYZINE HCL 25 MG PO TABS
25.0000 mg | ORAL_TABLET | Freq: Three times a day (TID) | ORAL | Status: DC | PRN
  Administered 2022-05-25 – 2022-06-17 (×14): 25 mg via ORAL
  Filled 2022-05-20 (×17): qty 1

## 2022-05-20 NOTE — BHH Counselor (Signed)
Adult Comprehensive Assessment  Patient ID: Cheryl Adams, female   DOB: 1990/12/17, 31 y.o.   MRN: [id number]  Information Source: Information source: Patient  Current Stressors:  Patient states their primary concerns and needs for treatment are:: "Depression, Anxiety, and Paranoia" Patient states their goals for this hospitilization and ongoing recovery are:: "To feel safe and learn some coping skills" Educational / Learning stressors: Pt reports having a 12th grade education and some college Employment / Job issues: Pt reports working at [employer] Relationships: Pt reports no conflicts but states that she feels distant and unsafe with her mother and sister Surveyor, quantity / Lack of resources (include bankruptcy): Pt reports no stressors Housing / Lack of housing: Pt reports living with her mother and sister Physical health (include injuries & life threatening diseases): Pt reports no stressors Social relationships: Pt reports having few social relationships Substance abuse: Pt denies all substance use Bereavement / Loss: Pt reports no stressors  Living/Environment/Situation:  Living Arrangements: Parent, Other relatives Living conditions (as described by patient or guardian): House/Family Who else lives in the home?: Mother and sister How long has patient lived in current situation?: 14 years What is atmosphere in current home: Chaotic, Comfortable  Family History:  Marital status: Single Are you sexually active?: No What is your sexual orientation?: Heterosexual Has your sexual activity been affected by drugs, alcohol, medication, or emotional stress?: No Does patient have children?: No  Childhood History:  By whom was/is the patient raised?: Mother Additional childhood history information: Pt reports that her father was not around during her childhood and that she does not have any relationship with this now. Description of patient's relationship with caregiver when they were a  child: "We got along very well and we were very close" Patient's description of current relationship with people who raised him/her: "We are OK now but distant" How were you disciplined when you got in trouble as a child/adolescent?: "whoopings" Does patient have siblings?: Yes Number of Siblings: 1 Description of patient's current relationship with siblings: "It is OK but it also feels distant" Did patient suffer any verbal/emotional/physical/sexual abuse as a child?: No Did patient suffer from severe childhood neglect?: No Has patient ever been sexually abused/assaulted/raped as an adolescent or adult?: No Was the patient ever a victim of a crime or a disaster?: No Witnessed domestic violence?: No Has patient been affected by domestic violence as an adult?: No  Education:  Highest grade of school patient has completed: 12th grade, some college Currently a Consulting civil engineer?: No Learning disability?: No  Employment/Work Situation:   Employment Situation: Employed Where is Patient Currently Employed?: [employer] How Long has Patient Been Employed?: 2 month Are You Satisfied With Your Job?: Yes Do You Work More Than One Job?: No Work Stressors: none Patient's Job has Been Impacted by Current Illness: No What is the Longest Time Patient has Held a Job?: 2 years Where was the Patient Employed at that Time?: [employer] Has Patient ever Been in the [employer]?: No  Financial Resources:   Financial resources: Income from employment, Private insurance Does patient have a representative payee or guardian?: No  Alcohol/Substance Abuse:   What has been your use of drugs/alcohol within the last 12 months?: Pt denies all substance use If attempted suicide, did drugs/alcohol play a role in this?: No Alcohol/Substance Abuse Treatment Hx: Denies past history Has alcohol/substance abuse ever caused legal problems?: No  Social Support System:   Conservation officer, nature Support System: Fair Development worker, community  Support System:  Mother and sister Type of faith/religion: None How does patient's faith help to cope with current illness?: N/A  Leisure/Recreation:   Do You Have Hobbies?: Yes Leisure and Hobbies: Bowling  Strengths/Needs:   What is the patient's perception of their strengths?: Listening Skills Patient states they can use these personal strengths during their treatment to contribute to their recovery: "I can get a different perspective and learn from others" Patient states these barriers may affect/interfere with their treatment: None Patient states these barriers may affect their return to the community: None Other important information patient would like considered in planning for their treatment: None  Discharge Plan:   Currently receiving community mental health services: Yes (From Whom) [facility] for medication management.) Patient states concerns and preferences for aftercare planning are: Pt would like to remain with [facility] and is also interested in therapy services. Patient states they will know when they are safe and ready for discharge when: "When I feel safe" Does patient have access to transportation?: Yes (Mother or Benedetto Goad) Does patient have financial barriers related to discharge medications?: No Will patient be returning to same living situation after discharge?: Yes  Summary/Recommendations:   Summary and Recommendations (to be completed by the evaluator): Cheryl Adams is a 31 year old, female, who was admitted to the hosptial due to worsening depression, anxiety, and paranoia.  She states that she did not feel safe at home and that her family was "acting different but I can't explain how".  The Pt is a poor historian and was unable to provide details about the circumstances that brought her to the hosptial.  The Pt reports that she is living with her mother and sister and feels that their relationship has become distant.  She reports having no current  relationship wtih her biological father.  The Pt reports no childhood trauma or abuse.  She reports working at [employer] and having [employer] medical insurance.  The Pt denies all current and previous substance use, as well as any prior substance use treatment.  While in the hospital the Pt can benefit from crisis stabilization, medication evaluation, group therapy, psycho-education, case management, and discharge planning.  Upon discharge the Pt would like to return to her home with her mother.  It is recommended that the Pt continue medication management services wtih [facility] and begin therapy services with a local outpatient provider.  Aram Beecham. 05/20/2022

## 2022-05-20 NOTE — Group Note (Signed)
LCSW Group Therapy Note   Group Date: 05/20/2022 Start Time: 1300 End Time: 1400   Type of Therapy and Topic:  Group Therapy: Boundaries  Participation Level:  Active  Description of Group: This group will address the use of boundaries in their personal lives. Patients will explore why boundaries are important, the difference between healthy and unhealthy boundaries, and negative and postive outcomes of different boundaries and will look at how boundaries can be crossed.  Patients will be encouraged to identify current boundaries in their own lives and identify what kind of boundary is being set. Facilitators will guide patients in utilizing problem-solving interventions to address and correct types boundaries being used and to address when no boundary is being used. Understanding and applying boundaries will be explored and addressed for obtaining and maintaining a balanced life. Patients will be encouraged to explore ways to assertively make their boundaries and needs known to significant others in their lives, using other group members and facilitator for role play, support, and feedback.  Therapeutic Goals:  1.  Patient will identify areas in their life where setting clear boundaries could be  used to improve their life.  2.  Patient will identify signs/triggers that a boundary is not being respected. 3.  Patient will identify two ways to set boundaries in order to achieve balance in  their lives: 4.  Patient will demonstrate ability to communicate their needs and set boundaries  through discussion and/or role plays  Summary of Patient Progress:  The Pt was present/active throughout the session and proved open to feedback from CSW and peers. Patient demonstrated insight into the subject matter, was respectful of peers, and was present throughout the entire session.  Therapeutic Modalities:   Cognitive Behavioral Therapy Solution-Focused Therapy  Aram Beecham, Theresia Majors 05/20/2022  1:53  PM

## 2022-05-20 NOTE — BHH Group Notes (Signed)
PT was notified but did not attend NA.

## 2022-05-20 NOTE — BH IP Treatment Plan (Signed)
Interdisciplinary Treatment and Diagnostic Plan Update  05/20/2022 Time of Session: 9:55am  Cheryl Adams MRN: [id number]  Principal Diagnosis: Schizo affective schizophrenia [facility])  Secondary Diagnoses: Principal Problem:   Schizo affective schizophrenia ([place])   Current Medications:  Current Facility-Administered Medications  Medication Dose Route Frequency Provider Last Rate Last Admin   acetaminophen (TYLENOL) tablet 500 mg  500 mg Oral Q6H PRN Massengill, Ovid Curd, MD       amLODipine (NORVASC) tablet 5 mg  5 mg Oral Daily Massengill, Nathan, MD   5 mg at 05/20/22 1015   ferrous sulfate tablet 325 mg  325 mg Oral Daily Merrily Brittle, DO   325 mg at 05/20/22 1015   hydrOXYzine (ATARAX) tablet 25 mg  25 mg Oral TID PRN Janine Limbo, MD       OLANZapine zydis (ZYPREXA) disintegrating tablet 5 mg  5 mg Oral Q8H PRN Massengill, Ovid Curd, MD       And   LORazepam (ATIVAN) tablet 1 mg  1 mg Oral Q8H PRN Massengill, Ovid Curd, MD       And   ziprasidone (GEODON) injection 20 mg  20 mg Intramuscular Q8H PRN Massengill, Ovid Curd, MD       risperiDONE (RISPERDAL) tablet 1 mg  1 mg Oral Daily Merrily Brittle, DO   1 mg at 05/20/22 1015   risperiDONE (RISPERDAL) tablet 2 mg  2 mg Oral QHS Merrily Brittle, DO   2 mg at 05/19/22 2118   traZODone (DESYREL) tablet 50 mg  50 mg Oral QHS PRN Massengill, Ovid Curd, MD       PTA Medications: Medications Prior to Admission  Medication Sig Dispense Refill Last Dose   ferrous sulfate 325 (65 FE) MG tablet Take 325 mg by mouth daily.      risperiDONE (RISPERDAL) 1 MG tablet Take 1 tablet (1 mg total) by mouth daily.      risperiDONE (RISPERDAL) 2 MG tablet Take 1 tablet (2 mg total) by mouth at bedtime.       Patient Stressors: Health problems   Marital or family conflict   Medication change or noncompliance    Patient Strengths: Average or above average intelligence  Supportive family/friends   Treatment Modalities: Medication Management, Group therapy,  Case management,  1 to 1 session with clinician, Psychoeducation, Recreational therapy.   Physician Treatment Plan for Primary Diagnosis: Schizo affective schizophrenia (Cheryl Adams) Long Term Goal(s):     Short Term Goals:    Medication Management: Evaluate patient's response, side effects, and tolerance of medication regimen.  Therapeutic Interventions: 1 to 1 sessions, Unit Group sessions and Medication administration.  Evaluation of Outcomes: Not Met  Physician Treatment Plan for Secondary Diagnosis: Principal Problem:   Schizo affective schizophrenia (Cheryl Adams)  Long Term Goal(s):     Short Term Goals:       Medication Management: Evaluate patient's response, side effects, and tolerance of medication regimen.  Therapeutic Interventions: 1 to 1 sessions, Unit Group sessions and Medication administration.  Evaluation of Outcomes: Not Met   RN Treatment Plan for Primary Diagnosis: Schizo affective schizophrenia (Cheryl Adams) Long Term Goal(s): Knowledge of disease and therapeutic regimen to maintain health will improve  Short Term Goals: Ability to remain free from injury will improve, Ability to participate in decision making will improve, Ability to verbalize feelings will improve, Ability to disclose and discuss suicidal ideas, and Ability to identify and develop effective coping behaviors will improve  Medication Management: RN will administer medications as ordered by provider, will assess and evaluate patient's  response and provide education to patient for prescribed medication. RN will report any adverse and/or side effects to prescribing provider.  Therapeutic Interventions: 1 on 1 counseling sessions, Psychoeducation, Medication administration, Evaluate responses to treatment, Monitor vital signs and CBGs as ordered, Perform/monitor CIWA, COWS, AIMS and Fall Risk screenings as ordered, Perform wound care treatments as ordered.  Evaluation of Outcomes: Not Met   LCSW Treatment Plan for  Primary Diagnosis: Schizo affective schizophrenia (Cheryl Adams) Long Term Goal(s): Safe transition to appropriate next level of care at discharge, Engage patient in therapeutic group addressing interpersonal concerns.  Short Term Goals: Engage patient in aftercare planning with referrals and resources, Increase social support, Increase emotional regulation, Facilitate acceptance of mental health diagnosis and concerns, Identify triggers associated with mental health/substance abuse issues, and Increase skills for wellness and recovery  Therapeutic Interventions: Assess for all discharge needs, 1 to 1 time with Social worker, Explore available resources and support systems, Assess for adequacy in community support network, Educate family and significant other(s) on suicide prevention, Complete Psychosocial Assessment, Interpersonal group therapy.  Evaluation of Outcomes: Not Met   Progress in Treatment: Attending groups: Yes. Participating in groups: Yes. Taking medication as prescribed: Yes. Toleration medication: Yes. Family/Significant other contact made: Yes, individual(s) contacted:  Mother  Patient understands diagnosis: No. Discussing patient identified problems/goals with staff: Yes. Medical problems stabilized or resolved: Yes. Denies suicidal/homicidal ideation: Yes. Issues/concerns per patient self-inventory: No.   New problem(s) identified: No, Describe:  None   New Short Term/Long Term Goal(s): medication stabilization, elimination of SI thoughts, development of comprehensive mental wellness plan.   Patient Goals:  Did not attend   Discharge Plan or Barriers: Patient recently admitted. CSW will continue to follow and assess for appropriate referrals and possible discharge planning.   Reason for Continuation of Hospitalization: Anxiety Depression Medication stabilization Suicidal ideation Other; describe Paranoia   Estimated Length of Stay: 3 to 7 days   Last 3 [place]  Suicide Severity Risk Score: [place] Admission (Current) from 05/19/2022 in [place] [facility] ED from 05/18/2022 in [facility] Office Visit from 04/14/2022 in [place] No Risk High Risk No Risk       Last PHQ 2/9 Scores:    04/14/2022   10:46 AM 02/24/2022   11:12 AM 01/13/2022    9:14 AM  Depression screen PHQ 2/9  Decreased Interest 0 0 0  Down, Depressed, Hopeless 0 0 0  PHQ - 2 Score 0 0 0    Scribe for Treatment Team: Darleen Crocker, Latanya Presser 05/20/2022 11:16 AM

## 2022-05-20 NOTE — Group Note (Signed)
Recreation Therapy Group Note   Group Topic:Stress Management  Group Date: 05/20/2022 Start Time: 0935 End Time: 0948 Facilitators: Caroll Rancher, [place] Location: [address] Dayroom   Goal Area(s) Addresses:  Patient will identify positive stress management techniques. Patient will identify benefits of using stress management post d/c.  Group Description:  [facility] Meditation.  Patients were to relax and listen to the meditation, which focused on taking on all of the characteristics a mountain has to offer.  Patients were to picture themselves weathering what life throws at them without giving up, just as the mountain stands tall no matter goes on around it.    Affect/Mood: Appropriate   Participation Level: Engaged   Participation Quality: Independent   Behavior: Appropriate   Speech/Thought Process: Focused   Insight: Good   Judgement: Good   Modes of Intervention: Meditation   Patient Response to Interventions:  Engaged   Education Outcome:  Acknowledges education and In group clarification offered    Clinical Observations/Individualized Feedback: Pt attended and participated in group session.    Plan: Continue to engage patient in RT group sessions 2-3x/week.   Caroll Rancher, Antonietta Jewel 05/20/2022 12:17 PM
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
## 2022-05-20 NOTE — Progress Notes (Signed)
   05/20/22 1000  Psych Admission Type (Psych Patients Only)  Admission Status Voluntary  Psychosocial Assessment  Patient Complaints Anxiety;Depression  Eye Contact Fair  Facial Expression Anxious  Affect Apathetic  Speech Soft  Interaction Childlike;Guarded  Motor Activity Fidgety  Appearance/Hygiene Unremarkable  Behavior Characteristics Cooperative  Mood Anxious;Pleasant  Thought Process  Coherency WDL  Content WDL  Delusions None reported or observed  Perception WDL  Hallucination None reported or observed  Judgment Poor  Confusion None  Danger to Self  Current suicidal ideation? Denies  Self-Injurious Behavior No self-injurious ideation or behavior indicators observed or expressed   Agreement Not to Harm Self Yes  Description of Agreement verbal  Danger to Others  Danger to Others None reported or observed
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
## 2022-05-21 DIAGNOSIS — F333 Major depressive disorder, recurrent, severe with psychotic symptoms: Secondary | ICD-10-CM | POA: Diagnosis not present

## 2022-05-21 NOTE — Progress Notes (Signed)
[facility] Group Notes:  (Nursing/MHT/Case Management/Adjunct)  Date:  05/21/2022  Time:  2020  Type of Therapy:   wrap up group  Participation Level:  Active  Participation Quality:  Appropriate, Attentive, Sharing, and Supportive  Affect:  Appropriate  Cognitive:  Alert  Insight:  Improving  Engagement in Group:  Engaged  Modes of Intervention:  Clarification, Education, and Support  Summary of Progress/Problems: Positive thinking and positive change were discussed.   Marcille Buffy 05/21/2022, 9:29 PM

## 2022-05-21 NOTE — Progress Notes (Addendum)
Patient ID: Cheryl Adams, female   DOB: 06/01/1991, 31 y.o.   MRN: [id number]  MRI was discontinued per MD order, and appointment was canceled.

## 2022-05-21 NOTE — [facility] Group Notes (Signed)
Adult Relaxation Group Note  Date:  05/21/2022 Time:  2:34 PM  Pt did not attend group.   Thomas Hoff 05/21/2022, 2:34 PM

## 2022-05-21 NOTE — Plan of Care (Signed)
  Problem: Coping: Goal: Ability to verbalize frustrations and anger appropriately will improve Outcome: Progressing   Problem: Coping: Goal: Ability to demonstrate self-control will improve Outcome: Progressing   Problem: Safety: Goal: Periods of time without injury will increase Outcome: Progressing   Problem: Health Behavior/Discharge Planning: Goal: Compliance with prescribed medication regimen will improve Outcome: Progressing   Problem: Nutritional: Goal: Ability to achieve adequate nutritional intake will improve Outcome: Progressing   Problem: Safety: Goal: Ability to redirect hostility and anger into socially appropriate behaviors will improve Outcome: Progressing   Problem: Safety: Goal: Ability to remain free from injury will improve Outcome: Progressing

## 2022-05-21 NOTE — Progress Notes (Signed)
   05/21/22 2000  Psych Admission Type (Psych Patients Only)  Admission Status Voluntary  Psychosocial Assessment  Patient Complaints Anxiety;Depression  Eye Contact Fair  Facial Expression Anxious  Affect Anxious  Speech Soft;Logical/coherent  Interaction Assertive  Motor Activity Other (Comment) (wnl)  Appearance/Hygiene Unremarkable  Behavior Characteristics Cooperative  Mood Anxious;Pleasant  Thought Process  Coherency WDL  Content WDL  Delusions None reported or observed  Perception WDL  Hallucination None reported or observed  Judgment Poor  Confusion None  Danger to Self  Current suicidal ideation? Denies  Danger to Others  Danger to Others None reported or observed   Progress note   D: Pt seen at nurse's station. Pt denies SI, HI, AVH. Pt rates pain  0/10. Pt rates anxiety  5/10 and depression  4/10. Pt states that she has been attending groups. Says her appetite is fair and sleep is moderate. Was given PRN last night and says this helped. Pt denied any other concerns at this time.  A: Pt provided support and encouragement. Pt given scheduled medication as prescribed. PRNs as appropriate. Q15 min checks for safety.   R: Pt safe on the unit. Will continue to monitor.
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
## 2022-05-21 NOTE — Progress Notes (Signed)
   05/21/22 1046  Psych Admission Type (Psych Patients Only)  Admission Status Voluntary  Psychosocial Assessment  Patient Complaints Sadness;Depression  Eye Contact Fair  Facial Expression Anxious  Affect Anxious  Speech Soft  Interaction Minimal  Motor Activity Other (Comment) (WNL)  Appearance/Hygiene Unremarkable  Behavior Characteristics Cooperative  Mood Pleasant  Thought Process  Coherency WDL  Content WDL  Delusions None reported or observed  Perception WDL  Hallucination None reported or observed  Judgment Poor  Confusion None  Danger to Self  Current suicidal ideation? Denies  Self-Injurious Behavior No self-injurious ideation or behavior indicators observed or expressed   Agreement Not to Harm Self Yes  Description of Agreement verbal  Danger to Others  Danger to Others None reported or observed

## 2022-05-22 ENCOUNTER — Ambulatory Visit (HOSPITAL_BASED_OUTPATIENT_CLINIC_OR_DEPARTMENT_OTHER): Admit: 2022-05-22 | Payer: Medicaid Other

## 2022-05-22 DIAGNOSIS — F333 Major depressive disorder, recurrent, severe with psychotic symptoms: Secondary | ICD-10-CM | POA: Diagnosis not present

## 2022-05-22 MED ORDER — TRAZODONE HCL 100 MG PO TABS
100.0000 mg | ORAL_TABLET | Freq: Every day | ORAL | Status: DC
  Administered 2022-05-22 – 2022-05-25 (×4): 100 mg via ORAL
  Filled 2022-05-22 (×7): qty 1

## 2022-05-22 MED ORDER — SERTRALINE HCL 50 MG PO TABS
50.0000 mg | ORAL_TABLET | Freq: Every day | ORAL | Status: DC
  Administered 2022-05-23 – 2022-05-24 (×2): 50 mg via ORAL
  Filled 2022-05-22 (×4): qty 1

## 2022-05-22 MED ORDER — ENSURE ENLIVE PO LIQD
237.0000 mL | Freq: Three times a day (TID) | ORAL | Status: DC
  Administered 2022-05-22 – 2022-05-26 (×11): 237 mL via ORAL
  Filled 2022-05-22 (×21): qty 237

## 2022-05-22 MED ORDER — SERTRALINE HCL 25 MG PO TABS
25.0000 mg | ORAL_TABLET | Freq: Once | ORAL | Status: AC
  Administered 2022-05-22: 25 mg via ORAL
  Filled 2022-05-22 (×2): qty 1

## 2022-05-22 MED ORDER — TRAZODONE HCL 100 MG PO TABS: 100.0000 mg | ORAL_TABLET | Freq: Every evening | ORAL | Status: DC | PRN

## 2022-05-22 NOTE — [facility] Group Notes (Signed)
PT attended AA and was attentive and appropriate.

## 2022-05-22 NOTE — Progress Notes (Signed)
D- Patient alert and oriented. Patient affect/mood reported as improving. Denies SI, HI, AVH, and pain. Patient was given ensure supplement as ordered.   A- Scheduled medications administered to patient, per MD orders. Support and encouragement provided.  Routine safety checks conducted every 15 minutes.  Patient informed to notify staff with problems or concerns. R- No adverse drug reactions noted. Patient contracts for safety at this time. Patient compliant with medications and treatment plan. Patient receptive, calm, and cooperative. Patient interacts well with others on the unit.  Patient remains safe at this time.

## 2022-05-23 DIAGNOSIS — F251 Schizoaffective disorder, depressive type: Secondary | ICD-10-CM | POA: Diagnosis not present

## 2022-05-23 DIAGNOSIS — I10 Essential (primary) hypertension: Secondary | ICD-10-CM | POA: Diagnosis not present

## 2022-05-23 DIAGNOSIS — Z20822 Contact with and (suspected) exposure to covid-19: Secondary | ICD-10-CM | POA: Diagnosis not present

## 2022-05-23 DIAGNOSIS — F333 Major depressive disorder, recurrent, severe with psychotic symptoms: Secondary | ICD-10-CM | POA: Diagnosis not present

## 2022-05-23 DIAGNOSIS — F061 Catatonic disorder due to known physiological condition: Secondary | ICD-10-CM | POA: Diagnosis not present

## 2022-05-23 LAB — URINALYSIS, COMPLETE (UACMP) WITH MICROSCOPIC
Bilirubin Urine: NEGATIVE
Glucose, UA: NEGATIVE mg/dL
Hgb urine dipstick: NEGATIVE
Ketones, ur: NEGATIVE mg/dL
Leukocytes,Ua: NEGATIVE
Nitrite: NEGATIVE
Protein, ur: NEGATIVE mg/dL
Specific Gravity, Urine: 1.017 (ref 1.005–1.030)
pH: 7 (ref 5.0–8.0)

## 2022-05-23 MED ORDER — MELATONIN 3 MG PO TABS
3.0000 mg | ORAL_TABLET | Freq: Every day | ORAL | Status: DC
  Administered 2022-05-23 – 2022-05-25 (×3): 3 mg via ORAL
  Filled 2022-05-23 (×5): qty 1

## 2022-05-23 MED ORDER — BENZTROPINE MESYLATE 0.5 MG PO TABS: 0.5000 mg | ORAL_TABLET | Freq: Two times a day (BID) | ORAL | Status: DC | PRN

## 2022-05-23 MED ORDER — ZIPRASIDONE HCL 80 MG PO CAPS
80.0000 mg | ORAL_CAPSULE | Freq: Two times a day (BID) | ORAL | Status: DC
  Administered 2022-05-23 – 2022-06-01 (×18): 80 mg via ORAL
  Filled 2022-05-23 (×21): qty 1

## 2022-05-23 NOTE — [facility] Group Notes (Signed)
.  Psychoeducational Group Note  Date 05/23/2021 Time: 0900-1000    Goal Setting   Purpose of Group: Group Focus: affirmation, clarity of thought, and goals/reality orientation Treatment Modality:  Psychoeducation Interventions utilized were assignment, group exercise, and support  Purpose: To be able to understand and verbalize the reason for their admission to the hospital. To understand that the medication helps with their chemical imbalance but they also need to work on their choices in life. To be challenged to develop a list of 30 positives about themselves. Also introduce the concept that "feelings" are not reality.    Participation Level:  Active  Participation Quality:  Appropriate  Affect:  Appropriate  Cognitive:  Appropriate  Insight:  Improving  Engagement in Group:  Engaged  Additional Comments: Rates energy at a 4/10. States she is here due to an argument.  Dione Housekeeper

## 2022-05-23 NOTE — B[facility] Group Notes (Signed)
.  Psychoeducational Group Note    Date:  6/17//23 Time: 1300-1400    Purpose of Group: . The group focus' on teaching patients on how to identify their needs and their Life Skills:  A group where two lists are made. What people need and what are things that we do that are unhealthy. The lists are developed by the patients and it is explained that we often do the actions that are not healthy to get our list of needs met.  Goal:: to develop the coping skills needed to get their needs met  Participation Level:  did not attend  Paulino Rily

## 2022-05-24 DIAGNOSIS — E559 Vitamin D deficiency, unspecified: Secondary | ICD-10-CM | POA: Diagnosis present

## 2022-05-24 DIAGNOSIS — F333 Major depressive disorder, recurrent, severe with psychotic symptoms: Secondary | ICD-10-CM | POA: Diagnosis not present

## 2022-05-24 LAB — CBC WITH DIFFERENTIAL/PLATELET
Abs Immature Granulocytes: 0.04 10*3/uL (ref 0.00–0.07)
Basophils Absolute: 0 10*3/uL (ref 0.0–0.1)
Basophils Relative: 1 %
Eosinophils Absolute: 0.1 10*3/uL (ref 0.0–0.5)
Eosinophils Relative: 2 %
HCT: 44 % (ref 36.0–46.0)
Hemoglobin: 13.8 g/dL (ref 12.0–15.0)
Immature Granulocytes: 1 %
Lymphocytes Relative: 35 %
Lymphs Abs: 2.1 10*3/uL (ref 0.7–4.0)
MCH: 26.4 pg (ref 26.0–34.0)
MCHC: 31.4 g/dL (ref 30.0–36.0)
MCV: 84.1 fL (ref 80.0–100.0)
Monocytes Absolute: 0.4 10*3/uL (ref 0.1–1.0)
Monocytes Relative: 6 %
Neutro Abs: 3.2 10*3/uL (ref 1.7–7.7)
Neutrophils Relative %: 55 %
Platelets: 272 10*3/uL (ref 150–400)
RBC: 5.23 MIL/uL — ABNORMAL HIGH (ref 3.87–5.11)
RDW: 14.5 % (ref 11.5–15.5)
WBC: 5.9 10*3/uL (ref 4.0–10.5)
nRBC: 0 % (ref 0.0–0.2)

## 2022-05-24 LAB — VITAMIN B12: Vitamin B-12: 352 pg/mL (ref 180–914)

## 2022-05-24 LAB — VITAMIN D 25 HYDROXY (VIT D DEFICIENCY, FRACTURES): Vit D, 25-Hydroxy: 11.82 ng/mL — ABNORMAL LOW (ref 30–100)

## 2022-05-24 MED ORDER — LORAZEPAM 1 MG PO TABS: 2.0000 mg | ORAL_TABLET | Freq: Once | ORAL | Status: AC

## 2022-05-24 MED ORDER — LORAZEPAM 2 MG/ML IJ SOLN
2.0000 mg | Freq: Once | INTRAMUSCULAR | Status: AC
  Administered 2022-05-24: 2 mg via INTRAMUSCULAR

## 2022-05-24 MED ORDER — VITAMIN D (ERGOCALCIFEROL) 1.25 MG (50000 UNIT) PO CAPS
50000.0000 [IU] | ORAL_CAPSULE | ORAL | Status: DC
  Administered 2022-05-25 – 2022-07-20 (×9): 50000 [IU] via ORAL
  Filled 2022-05-24 (×11): qty 1

## 2022-05-24 MED ORDER — LORAZEPAM 2 MG/ML IJ SOLN
INTRAMUSCULAR | Status: AC
  Filled 2022-05-24: qty 1

## 2022-05-24 MED ORDER — SERTRALINE HCL 50 MG PO TABS
75.0000 mg | ORAL_TABLET | Freq: Every day | ORAL | Status: DC
  Administered 2022-05-25 – 2022-05-28 (×4): 75 mg via ORAL
  Filled 2022-05-24 (×6): qty 1

## 2022-05-24 NOTE — Progress Notes (Signed)
Patient ID: Cheryl Adams, female   DOB: 1991/11/01, 31 y.o.   MRN: [id number] Pt attended goals group and participated in discussion.

## 2022-05-25 ENCOUNTER — Encounter (HOSPITAL_COMMUNITY): Payer: Self-pay

## 2022-05-25 DIAGNOSIS — F333 Major depressive disorder, recurrent, severe with psychotic symptoms: Secondary | ICD-10-CM | POA: Diagnosis not present

## 2022-05-25 MED ORDER — HALOPERIDOL 5 MG PO TABS
5.0000 mg | ORAL_TABLET | Freq: Two times a day (BID) | ORAL | Status: DC
Start: 2022-05-25 — End: 2022-05-25
  Filled 2022-05-25 (×2): qty 1

## 2022-05-25 MED ORDER — HALOPERIDOL 2 MG PO TABS
2.0000 mg | ORAL_TABLET | Freq: Every day | ORAL | Status: DC
  Filled 2022-05-25: qty 1

## 2022-05-25 MED ORDER — HALOPERIDOL 5 MG PO TABS
5.0000 mg | ORAL_TABLET | Freq: Two times a day (BID) | ORAL | Status: DC
  Administered 2022-05-25 – 2022-05-28 (×6): 5 mg via ORAL
  Filled 2022-05-25 (×10): qty 1

## 2022-05-25 MED ORDER — HALOPERIDOL 2 MG PO TABS
2.0000 mg | ORAL_TABLET | Freq: Every day | ORAL | Status: AC
  Administered 2022-05-25: 2 mg via ORAL
  Filled 2022-05-25 (×2): qty 1

## 2022-05-25 NOTE — Group Note (Signed)
Recreation Therapy Group Note   Group Topic:Stress Management  Group Date: 05/25/2022 Start Time: 0981 End Time: 0950 Facilitators: Caroll Rancher, [place] Location: 400 Hall Dayroom   Goal Area(s) Addresses:  Patient will identify positive stress management techniques. Patient will identify benefits of using stress management post d/c.   Group Description:  Meditation.  LRT played a meditation that focused on finding your inner peace.  Patients were to listen and follow along as the meditation played to engage in the activity.  Patients were to use their breathing to calm and relax them.  Patients were encouraged to use other sources such as Youtube, scripts, Internet and other apps.   Affect/Mood: Appropriate   Participation Level: Active   Participation Quality: Independent   Behavior: Appropriate   Speech/Thought Process: Focused   Insight: Good   Judgement: Good   Modes of Intervention: Meditation   Patient Response to Interventions:  Engaged   Education Outcome:  Acknowledges education and In group clarification offered    Clinical Observations/Individualized Feedback: Pt attended and participated in group session.    Plan: Continue to engage patient in RT group sessions 2-3x/week.   Caroll Rancher, LRT,CTRS 05/25/2022 11:45 AM

## 2022-05-25 NOTE — Group Note (Signed)
BHH LCSW Group Therapy   Type of Therapy and Topic:  Group Therapy:  Wellness   Participation Level: Did Not Attend  Description of Group: This group allows individuals to explore the 6 dimensions of wellness, including spiritual, emotional, intellectual, physical, social, environmental, financial and spiritual. Patients will learn to different ways to practice wellness to improve well-being. Patients also participated in a conversation about what wellness means to them.   Individuals will think about ways in which they currently practice wellness as well as ways they can improve their wellness and new ways to practice wellness.       Therapeutic Goals Patient will verbalize 1 pr 2 we;;mess areas where they are doing well. Patient will identify 2 areas where they would like to improve their wellness.   Patient will provide a definition of what wellness means to them.  Patients will reflect on current hospitalization and primary areas to maintain mental health to prevent re-hospitalization.     Summary of Patient Progress:  Did not attend       Therapeutic Modalities Cognitive Behavioral Therapy Motivational Interviewing   

## 2022-05-25 NOTE — Progress Notes (Signed)
Patient has been ambulating up and down the hall most of the day. When asked questions, patient does not answer at times, looks at the wall or another object, and stares blankly. Patient denied SI/HI at present, denies A/V hallucinations, but she appears to be responding to internal stimuli. Patient also appears paranoid. Will continue q75min checks and verbally redirect as needed.

## 2022-05-26 ENCOUNTER — Encounter (HOSPITAL_COMMUNITY): Payer: 59 | Admitting: Family

## 2022-05-26 DIAGNOSIS — F333 Major depressive disorder, recurrent, severe with psychotic symptoms: Secondary | ICD-10-CM | POA: Diagnosis not present

## 2022-05-26 DIAGNOSIS — Z20822 Contact with and (suspected) exposure to covid-19: Secondary | ICD-10-CM | POA: Diagnosis not present

## 2022-05-26 DIAGNOSIS — I10 Essential (primary) hypertension: Secondary | ICD-10-CM | POA: Diagnosis not present

## 2022-05-26 DIAGNOSIS — F251 Schizoaffective disorder, depressive type: Secondary | ICD-10-CM | POA: Diagnosis not present

## 2022-05-26 DIAGNOSIS — F061 Catatonic disorder due to known physiological condition: Secondary | ICD-10-CM | POA: Diagnosis not present

## 2022-05-26 LAB — PROLACTIN: Prolactin: 116 ng/mL — ABNORMAL HIGH (ref 4.8–23.3)

## 2022-05-26 MED ORDER — TRAZODONE HCL 150 MG PO TABS
150.0000 mg | ORAL_TABLET | Freq: Every day | ORAL | Status: DC
  Administered 2022-05-26 – 2022-06-05 (×11): 150 mg via ORAL
  Filled 2022-05-26 (×15): qty 1

## 2022-05-26 MED ORDER — MELATONIN 5 MG PO TABS
5.0000 mg | ORAL_TABLET | Freq: Every day | ORAL | Status: DC
  Administered 2022-05-26 – 2022-06-05 (×11): 5 mg via ORAL
  Filled 2022-05-26 (×15): qty 1

## 2022-05-26 MED ORDER — WHITE PETROLATUM EX OINT
TOPICAL_OINTMENT | CUTANEOUS | Status: AC
  Filled 2022-05-26: qty 5

## 2022-05-26 NOTE — [facility] Group Notes (Signed)
Adult Orientation Group Note  Date:  05/26/2022 Time:  2:33 PM  Group Topic/Focus:  Orientation:   The focus of this group is to educate the patient on the purpose and policies of crisis stabilization and provide a format to answer questions about their admission.  The group details unit policies and expectations of patients while admitted.  Participation Level:  Did Not Attend   Thomas Hoff 05/26/2022, 2:33 PM

## 2022-05-26 NOTE — Group Note (Signed)
Recreation Therapy Group Note   Group Topic:Team Building  Group Date: 05/26/2022 Start Time: 1002 End Time: 1030 Facilitators: Caroll Rancher, LRT,CTRS Location: 500 Hall Dayroom   Goal Area(s) Addresses:  Patient will effectively work with peer towards shared goal.  Patient will identify skills used to make activity successful.  Patient will share challenges and verbalize solution-driven approaches used. Patient will identify how skills used during activity can be used to reach post d/c goals.   Group Description:  [employer]. Patients were provided the following materials: 5 drinking straws, 5 rubber bands, 5 paper clips, 2 index cards and 2 drinking cups. Using the provided materials patients were asked to build a launching mechanism to launch a ping pong ball across the room, approximately 10 feet. Patients were divided into teams of 3-5. Instructions required all materials be incorporated into the device, functionality of items left to the peer group's discretion.   Affect/Mood: N/A   Participation Level: Did not attend    Clinical Observations/Individualized Feedback:    Plan: Continue to engage patient in RT group sessions 2-3x/week.   Caroll Rancher, Antonietta Jewel 05/26/2022 12:42 PM
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
## 2022-05-27 DIAGNOSIS — F333 Major depressive disorder, recurrent, severe with psychotic symptoms: Secondary | ICD-10-CM | POA: Diagnosis not present

## 2022-05-27 LAB — VITAMIN B1: Vitamin B1 (Thiamine): 99.2 nmol/L (ref 66.5–200.0)
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
## 2022-05-27 NOTE — [facility] Group Notes (Signed)
Adult Psychoeducational Group Note  Date:  05/27/2022 Time:  1:17 PM  Group Topic/Focus:  Orientation:   The focus of this group is to educate the patient on the purpose and policies of crisis stabilization and provide a format to answer questions about their admission.  The group details unit policies and expectations of patients while admitted.  Participation Level:  Active  Participation Quality:  Appropriate  Affect:  Appropriate  Cognitive:  Appropriate  Insight: Appropriate  Engagement in Group:  Engaged  Modes of Intervention:  Discussion  Additional Comments:  Patient attended morning orientation group and participated.   Caryl Manas W Nava Song 05/27/2022, 1:17 PM

## 2022-05-27 NOTE — Progress Notes (Signed)
   05/27/22 2115  Psych Admission Type (Psych Patients Only)  Admission Status Voluntary  Psychosocial Assessment  Patient Complaints Suspiciousness  Eye Contact Fair  Facial Expression Flat  Affect Preoccupied  Speech Soft;Tangential  Interaction Forwards little  Motor Activity Slow  Appearance/Hygiene Unremarkable  Behavior Characteristics Cooperative  Mood Suspicious;Preoccupied  Aggressive Behavior  Effect No apparent injury  Thought Process  Coherency WDL  Content Preoccupation  Delusions None reported or observed  Perception WDL  Hallucination None reported or observed  Judgment Poor  Confusion WDL  Danger to Self  Current suicidal ideation? Denies  Danger to Others  Danger to Others None reported or observed

## 2022-05-27 NOTE — Progress Notes (Signed)
   05/27/22 0515  Sleep  Number of Hours 8

## 2022-05-27 NOTE — Progress Notes (Signed)
Pt did not attend group. 

## 2022-05-27 NOTE — Group Note (Signed)
[facility] LCSW Group Therapy Note  Date/Time: 05/27/2022 at 1pm  Type of Therapy and Topic:  Group Therapy:  Communication  Participation Level:  Active  Description of Group:    In this group patients will be encouraged to explore how individuals communicate with one another appropriately and inappropriately. Patients will be guided to discuss their thoughts, feelings, and behaviors related to barriers communicating feelings, needs, and stressors. The group will process together ways to execute positive and appropriate communications, with attention given to how one use behavior, tone, and body language to communicate. Patient will be encouraged to reflect on an incident where they were successfully able to communicate and the factors that they believe helped them to communicate. Each patient will be encouraged to identify specific changes they are motivated to make in order to overcome communication barriers with self, peers, authority, and parents. This group will be process-oriented, with patients participating in exploration of their own experiences as well as giving and receiving support and challenging self as well as other group members.  Therapeutic Goals: Patient will identify how people communicate (body language, facial expression, and electronics) Also discuss tone, voice and how these impact what is communicated and how the message is perceived.  Patient will identify feelings (such as fear or worry), thought process and behaviors related to why people internalize feelings rather than express self openly. Patient will identify two changes they are willing to make to overcome communication barriers. Members will then practice through Role Play how to communicate by utilizing psycho-education material (such as I Feel statements and acknowledging feelings rather than displacing on others)   Summary of Patient Progress: Patient participated appropriately in group.  She discussed a previous  hospital stay when she needed to ask for pillows and discussed strategies for assertively communicating. Patient participated in discussion around different communication styles and was open to feedback from other peers.      Therapeutic Modalities:   Cognitive Behavioral Therapy Solution Focused Therapy Motivational Interviewing Family Systems Approach    Decie Verne Vickery, LCSW, LCAS Clincal Social Worker  [facility]

## 2022-05-28 DIAGNOSIS — F333 Major depressive disorder, recurrent, severe with psychotic symptoms: Secondary | ICD-10-CM | POA: Diagnosis not present

## 2022-05-28 MED ORDER — BENZTROPINE MESYLATE 1 MG/ML IJ SOLN
2.0000 mg | Freq: Two times a day (BID) | INTRAMUSCULAR | Status: DC | PRN
Start: 2022-05-28 — End: 2022-06-06
  Administered 2022-05-28: 2 mg via INTRAMUSCULAR

## 2022-05-28 MED ORDER — BENZTROPINE MESYLATE 1 MG PO TABS
1.0000 mg | ORAL_TABLET | Freq: Two times a day (BID) | ORAL | Status: DC | PRN
  Administered 2022-05-29: 1 mg via ORAL
  Filled 2022-05-28: qty 1

## 2022-05-28 MED ORDER — HALOPERIDOL 5 MG PO TABS
5.0000 mg | ORAL_TABLET | Freq: Every day | ORAL | Status: DC
  Filled 2022-05-28 (×2): qty 1

## 2022-05-28 MED ORDER — SERTRALINE HCL 100 MG PO TABS
100.0000 mg | ORAL_TABLET | Freq: Every day | ORAL | Status: DC
  Administered 2022-05-29 – 2022-09-10 (×105): 100 mg via ORAL
  Filled 2022-05-28 (×111): qty 1

## 2022-05-28 MED ORDER — LORAZEPAM 2 MG/ML IJ SOLN
1.0000 mg | Freq: Four times a day (QID) | INTRAMUSCULAR | Status: DC | PRN
Start: 2022-05-28 — End: 2022-06-18
  Administered 2022-05-28: 1 mg via INTRAMUSCULAR
  Filled 2022-05-28: qty 1

## 2022-05-28 MED ORDER — HALOPERIDOL 5 MG PO TABS
10.0000 mg | ORAL_TABLET | Freq: Every day | ORAL | Status: DC
  Filled 2022-05-28 (×2): qty 2

## 2022-05-28 NOTE — Group Note (Signed)
Recreation Therapy Group Note   Group Topic:Self-Esteem  Group Date: 05/28/2022 Start Time: 1005 End Time: 1045 Facilitators: Caroll Rancher, LRT,CTRS Location: [address] Dayroom   Goal Area(s) Addresses:  Patient will identify and write at least one positive trait about themself. Patient will successfully identify influential people in their life and why they admire them. Patient will acknowledge the benefit of healthy self-esteem. Patient will endorse understanding of ways to increase self-esteem.   Group Description:   LRT began group session with open dialogue asking the patients to define self-esteem and verbally identify positive qualities and traits people may possess. Patients were then instructed to design a personalized license plate, with words and drawings, representing at least 3 positive things about themselves. Pts were encouraged to include favorites, things they are proud of, what they enjoy doing, and dreams for their future. If a patient had a life motto or a meaningful phase that expressed their life values, pt's were asked to incorporate that into their design as well. Patients were given the opportunity to share their completed work with the group.   Affect/Mood: Appropriate   Participation Level: Active   Participation Quality: Independent   Behavior: Appropriate   Speech/Thought Process: Focused   Insight: Good   Judgement: Good   Modes of Intervention: Art   Patient Response to Interventions:  Attentive   Education Outcome:  Acknowledges education and In group clarification offered    Clinical Observations/Individualized Feedback: Pt came late to group.  Pt expressed self esteem was "a way you feel about yourself/confidence".  Pt described herself as awesome, beautiful and blessed.  Pt explained she felt blessed because she lived to see another day.  Pt also stated it was had to identify positive things about herself because "it's not easy to talk  about yourself".     Plan: Continue to engage patient in RT group sessions 2-3x/week.   Caroll Rancher, LRT,CTRS 05/28/2022 12:16 PM

## 2022-05-28 NOTE — Progress Notes (Signed)
   05/28/22 0530  Sleep  Number of Hours 9.75
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
## 2022-05-28 NOTE — Progress Notes (Signed)
   05/28/22 2045  Psych Admission Type (Psych Patients Only)  Admission Status Voluntary  Psychosocial Assessment  Patient Complaints Suspiciousness  Eye Contact Fair  Facial Expression Flat  Affect Preoccupied  Speech Soft;Tangential  Interaction Forwards little  Motor Activity Slow  Appearance/Hygiene Unremarkable  Behavior Characteristics Cooperative  Mood Preoccupied  Aggressive Behavior  Effect No apparent injury  Thought Process  Coherency WDL  Content Preoccupation  Delusions None reported or observed  Perception WDL  Hallucination None reported or observed  Judgment Poor  Confusion WDL  Danger to Self  Current suicidal ideation? Denies  Danger to Others  Danger to Others None reported or observed

## 2022-05-28 NOTE — Progress Notes (Signed)
The focus of this group is to help patients review their daily goal of treatment and discuss progress on daily workbooks.  Pt attended the evening group and responded to all discussion prompts from the Writer. Pt shared that today was a good day on the unit, the highlight of which was going outside to the courtyard. "We played basketball, which was fun."  Pt told that, upon discharge, she planned to take better care of herself by being more assertive in her personal life. "I'm going to stand up for me more."  Pt rated her day a 5 out of 10 and her affect was appropriate.

## 2022-05-28 NOTE — Progress Notes (Addendum)
Pt received Cogentin 2 mg and Ativan 1 mg both IM at 1623 for eps symptoms /dystonia. Pt reports relief when assessed at 1720. Observed with stiffness in her neck and jaw, unable to move neck to the right side. Denies SI, HI, AVH and pain this shift "I haven't heard any voices yet this morning". Remains suspicious and paranoid, continues to believe her family is against her. Pt remains medication compliant. Safety checks maintained at Q 15 minutes intervals without self harm gestures. Verbal education provided on all medications and effects monitored. Required multiple prompts to attend scheduled groups. Tolerates all meals and fluids well. Remains safe in milieu.

## 2022-05-29 ENCOUNTER — Encounter (HOSPITAL_COMMUNITY): Payer: Self-pay

## 2022-05-29 DIAGNOSIS — F333 Major depressive disorder, recurrent, severe with psychotic symptoms: Secondary | ICD-10-CM | POA: Diagnosis not present

## 2022-05-29 DIAGNOSIS — I10 Essential (primary) hypertension: Secondary | ICD-10-CM | POA: Diagnosis not present

## 2022-05-29 DIAGNOSIS — Z20822 Contact with and (suspected) exposure to covid-19: Secondary | ICD-10-CM | POA: Diagnosis not present

## 2022-05-29 DIAGNOSIS — F061 Catatonic disorder due to known physiological condition: Secondary | ICD-10-CM | POA: Diagnosis not present

## 2022-05-29 DIAGNOSIS — F251 Schizoaffective disorder, depressive type: Secondary | ICD-10-CM | POA: Diagnosis not present

## 2022-05-29 LAB — COMPREHENSIVE METABOLIC PANEL
ALT: 15 U/L (ref 0–44)
AST: 18 U/L (ref 15–41)
Albumin: 3.9 g/dL (ref 3.5–5.0)
Alkaline Phosphatase: 58 U/L (ref 38–126)
Anion gap: 8 (ref 5–15)
BUN: 9 mg/dL (ref 6–20)
CO2: 26 mmol/L (ref 22–32)
Calcium: 8.9 mg/dL (ref 8.9–10.3)
Chloride: 106 mmol/L (ref 98–111)
Creatinine, Ser: 0.79 mg/dL (ref 0.44–1.00)
GFR, Estimated: >60 mL/min (ref >60)
Glucose, Bld: 124 mg/dL — ABNORMAL HIGH (ref 70–99)
Potassium: 3.9 mmol/L (ref 3.5–5.1)
Sodium: 140 mmol/L (ref 135–145)
Total Bilirubin: 0.6 mg/dL (ref 0.3–1.2)
Total Protein: 7.4 g/dL (ref 6.5–8.1)

## 2022-05-29 LAB — CBC WITH DIFFERENTIAL/PLATELET
Abs Immature Granulocytes: 0.03 10*3/uL (ref 0.00–0.07)
Basophils Absolute: 0 10*3/uL (ref 0.0–0.1)
Basophils Relative: 0 %
Eosinophils Absolute: 0.1 10*3/uL (ref 0.0–0.5)
Eosinophils Relative: 2 %
HCT: 40.9 % (ref 36.0–46.0)
Hemoglobin: 13 g/dL (ref 12.0–15.0)
Immature Granulocytes: 0 %
Lymphocytes Relative: 27 %
Lymphs Abs: 1.9 10*3/uL (ref 0.7–4.0)
MCH: 26.6 pg (ref 26.0–34.0)
MCHC: 31.8 g/dL (ref 30.0–36.0)
MCV: 83.6 fL (ref 80.0–100.0)
Monocytes Absolute: 0.5 10*3/uL (ref 0.1–1.0)
Monocytes Relative: 7 %
Neutro Abs: 4.4 10*3/uL (ref 1.7–7.7)
Neutrophils Relative %: 64 %
Platelets: 270 10*3/uL (ref 150–400)
RBC: 4.89 MIL/uL (ref 3.87–5.11)
RDW: 14.3 % (ref 11.5–15.5)
WBC: 6.9 10*3/uL (ref 4.0–10.5)
nRBC: 0 % (ref 0.0–0.2)

## 2022-05-29 LAB — SEDIMENTATION RATE: Sed Rate: 12 mm/hr (ref 0–22)

## 2022-05-29 LAB — BRAIN NATRIURETIC PEPTIDE: B Natriuretic Peptide: 66.6 pg/mL (ref 0.0–100.0)

## 2022-05-29 LAB — CK: Total CK: 304 U/L — ABNORMAL HIGH (ref 38–234)

## 2022-05-29 LAB — C-REACTIVE PROTEIN: CRP: 0.6 mg/dL (ref <1.0)

## 2022-05-29 MED ORDER — BENZTROPINE MESYLATE 0.5 MG PO TABS: 0.5000 mg | ORAL_TABLET | Freq: Two times a day (BID) | ORAL | Status: DC | PRN

## 2022-05-29 NOTE — Group Note (Signed)
Recreation Therapy Group Note   Group Topic:Personal Development  Group Date: 05/29/2022 Start Time: 1000 End Time: 1030 Facilitators: Caroll Rancher, LRT,CTRS Location: [address] Dayroom   Goal Area(s) Addresses:  Patient will successfully define what peace means to them. Patient will successfully identify how peace can be beneficial post discharge.    Group Description: Patients were given a worksheet were they what peace means to them.  Patients would then identify positive changes they have made, positive things they look for in relationships, goals they are working towards, nonmaterial things they are grateful for and what they need to set limits on.   Affect/Mood: Appropriate   Participation Level: Engaged   Participation Quality: Independent   Behavior: Appropriate   Speech/Thought Process: Barely audible    Insight: Good   Judgement: Good   Modes of Intervention: Worksheet   Patient Response to Interventions:  Engaged   Education Outcome:  Acknowledges education and In group clarification offered    Clinical Observations/Individualized Feedback: Pt defined peace as "going for a walk".  Pt identified changing medications for better sleep was a positive change for her.  Pt looks for compassion and someone who listens in positive relationships.  Pt also stated she is working towards focusing on the present.  Pt is thankful for "seeing another day and support from doctors".  Pt expressed she was to set limits on how she is being treated on a daily basis.  Pt also explained setting limits was hard because "you have follow certain rules and can't express yourself fully".     Plan: Continue to engage patient in RT group sessions 2-3x/week.   Caroll Rancher, LRT,CTRS 05/29/2022 1:21 PM

## 2022-05-29 NOTE — Group Note (Signed)
[facility] LCSW Group Therapy Note  Date/Time: 6/30 @ 11am  Type of Therapy and Topic:  Group Therapy:  Who Am I?  Self Esteem, Self-Actualization and Understanding Self.  Participation Level:  Active  Description of Group:    In this group patients will be asked to explore values, beliefs, truths, and morals as they relate to personal self.  Patients will be guided to discuss their thoughts, feelings, and behaviors related to what they identify as important to their true self. Patients will process together how values, beliefs and truths are connected to specific choices patients make every day. Each patient will be challenged to identify changes that they are motivated to make in order to improve self-esteem and self-actualization. This group will be process-oriented, with patients participating in exploration of their own experiences as well as giving and receiving support and challenge from other group members.  Therapeutic Goals: Patient will identify false beliefs that currently interfere with their self-esteem.  Patient will identify feelings, thought process, and behaviors related to self and will become aware of the uniqueness of themselves and of others.  Patient will be able to identify and verbalize values, morals, and beliefs as they relate to self. Patient will begin to learn how to build self-esteem/self-awareness by expressing what is important and unique to them personally.  Summary of Patient Progress: Patient participated appropriately in group.  She had good insight and identified stability as a value that she has. Patient participated in discussion with peers to identify values and accepted feedback from peers regarding her values.              Therapeutic Modalities:   Cognitive Behavioral Therapy Solution Focused Therapy Motivational Interviewing Brief Therapy   Nicolaas Savo, LCSW, LCAS Clincal Social Worker  [facility]

## 2022-05-29 NOTE — Progress Notes (Signed)
   05/29/22 0500  Sleep  Number of Hours 8.5

## 2022-05-29 NOTE — Progress Notes (Signed)
   05/29/22 2015  Psych Admission Type (Psych Patients Only)  Admission Status Voluntary  Psychosocial Assessment  Patient Complaints Suspiciousness  Eye Contact Fair  Facial Expression Flat  Affect Preoccupied  Speech Soft;Tangential  Interaction Forwards little  Motor Activity Slow  Appearance/Hygiene Unremarkable  Behavior Characteristics Cooperative  Mood Preoccupied;Suspicious  Aggressive Behavior  Effect No apparent injury  Thought Process  Coherency WDL  Content Preoccupation  Delusions None reported or observed  Perception WDL  Hallucination None reported or observed  Judgment Poor  Confusion WDL  Danger to Self  Current suicidal ideation? Denies  Danger to Others  Danger to Others None reported or observed

## 2022-05-29 NOTE — [facility] Group Notes (Signed)
  Spirituality group facilitated by Kathleen Argue, B[facility].   Group Description: Group focused on topic of hope. Patients participated in facilitated discussion around topic, connecting with one another around experiences and definitions for hope. Group members engaged with visual explorer photos, reflecting on what hope looks like for them today. Group engaged in discussion around how their definitions of hope are present today in hospital.   Modalities: Psycho-social ed, Adlerian, Narrative, MI   Patient Progress: Cheryl Adams attended group and actively engaged and participated in group conversation.  She was able to share some of her strengths as well as her hopes for herself. Her comments were on topic and appropriate.  [address] [facility] Pager, [phone]
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
## 2022-05-29 NOTE — Progress Notes (Addendum)
Pt awake watching TV in dayroom at this time. Rates her anxiety 5/10 "Just a little bit earlier, but I'm fine now". Denies SI, HI and AVH. Continues to endorse paranoia, still believes she's been followed, watch by other but is not specific "I can't say specifically right now" with a fixed smile. EKG done as ordered. Pt remains medication compliant without any dystonic episode thus far. Emotional support, encouragement and reassurance provided to pt. Safety checks maintained at Q 15  Minutes intervals without incident. All medications administered with verbal education and effects monitored. Pt attended scheduled groups, cooperative with care thus far.

## 2022-05-29 NOTE — Progress Notes (Signed)
Adult Psychoeducational Group Note  Date:  05/29/2022 Time:  8:37 PM  Group Topic/Focus:  Wrap-Up Group:   The focus of this group is to help patients review their daily goal of treatment and discuss progress on daily workbooks.  Participation Level:  Active  Participation Quality:  Appropriate and Attentive  Affect:  Appropriate  Cognitive:  Alert and Appropriate  Insight: Appropriate  Engagement in Group:  Engaged  Modes of Intervention:  Discussion  Additional Comments:   Pt was engaged during group discussion. Pt states that she had a good day and has been able to communicate more during groups and has been better at socializing with peers. Pt states that she had anxiety earlier but states she has felt better as the day went on. Pt states she communicated with her care team today. One of her goals was to take everyday one step at a time and become more vocal about her needs.   Vevelyn Pat 05/29/2022, 8:37 PM

## 2022-05-29 NOTE — BH IP Treatment Plan (Signed)
Interdisciplinary Treatment and Diagnostic Plan Update  05/29/2022 Time of Session: 9:55am  Cheryl Adams MRN: [id number]  Principal Diagnosis: Schizoaffective disorder, depressive type (HCC)  Secondary Diagnoses: Principal Problem:   Schizoaffective disorder, depressive type (HCC) Active Problems:   Insomnia   GAD (generalized anxiety disorder)   Vitamin D deficiency   Current Medications:  Current Facility-Administered Medications  Medication Dose Route Frequency Provider Last Rate Last Admin   acetaminophen (TYLENOL) tablet 500 mg  500 mg Oral Q6H PRN Massengill, Harrold Donath, MD       amLODipine (NORVASC) tablet 5 mg  5 mg Oral Daily Massengill, Nathan, MD   5 mg at 05/29/22 0719   benztropine (COGENTIN) tablet 1 mg  1 mg Oral BID PRN Massengill, Harrold Donath, MD   1 mg at 05/29/22 0719   benztropine mesylate (COGENTIN) injection 2 mg  2 mg Intramuscular BID PRN Massengill, Harrold Donath, MD   2 mg at 05/28/22 1623   ferrous sulfate tablet 325 mg  325 mg Oral Daily Princess Bruins, DO   325 mg at 05/29/22 0719   hydrOXYzine (ATARAX) tablet 25 mg  25 mg Oral TID PRN Phineas Inches, MD   25 mg at 05/27/22 2048   LORazepam (ATIVAN) injection 1 mg  1 mg Intramuscular Q6H PRN Massengill, Harrold Donath, MD   1 mg at 05/28/22 1623   OLANZapine zydis (ZYPREXA) disintegrating tablet 5 mg  5 mg Oral Q8H PRN Massengill, Harrold Donath, MD   5 mg at 05/24/22 2154   And   LORazepam (ATIVAN) tablet 1 mg  1 mg Oral Q8H PRN Massengill, Harrold Donath, MD       And   ziprasidone (GEODON) injection 20 mg  20 mg Intramuscular Q8H PRN Massengill, Nathan, MD       melatonin tablet 5 mg  5 mg Oral QHS Nkwenti, Doris, NP   5 mg at 05/28/22 2036   sertraline (ZOLOFT) tablet 100 mg  100 mg Oral Daily Massengill, Harrold Donath, MD   100 mg at 05/29/22 0719   traZODone (DESYREL) tablet 150 mg  150 mg Oral QHS Starleen Blue, NP   150 mg at 05/28/22 2036   Vitamin D (Ergocalciferol) (DRISDOL) capsule 50,000 Units  50,000 Units Oral Q7 days Starleen Blue, NP   50,000 Units at 05/25/22 2841   ziprasidone (GEODON) capsule 80 mg  80 mg Oral BID WC Starleen Blue, NP   80 mg at 05/29/22 0719   PTA Medications: Medications Prior to Admission  Medication Sig Dispense Refill Last Dose   ferrous sulfate 325 (65 FE) MG tablet Take 325 mg by mouth daily.      risperiDONE (RISPERDAL) 1 MG tablet Take 1 tablet (1 mg total) by mouth daily.      risperiDONE (RISPERDAL) 2 MG tablet Take 1 tablet (2 mg total) by mouth at bedtime.       Patient Stressors: Health problems   Marital or family conflict   Medication change or noncompliance    Patient Strengths: Average or above average intelligence  Supportive family/friends   Treatment Modalities: Medication Management, Group therapy, Case management,  1 to 1 session with clinician, Psychoeducation, Recreational therapy.   Physician Treatment Plan for Primary Diagnosis: Schizoaffective disorder, depressive type (HCC) Long Term Goal(s): Improvement in symptoms so as ready for discharge   Short Term Goals: Ability to verbalize feelings will improve Ability to disclose and discuss suicidal ideas Ability to identify and develop effective coping behaviors will improve Compliance with prescribed medications will improve  Medication Management: Evaluate  patient's response, side effects, and tolerance of medication regimen.  Therapeutic Interventions: 1 to 1 sessions, Unit Group sessions and Medication administration.  Evaluation of Outcomes: Progressing  Physician Treatment Plan for Secondary Diagnosis: Principal Problem:   Schizoaffective disorder, depressive type (HCC) Active Problems:   Insomnia   GAD (generalized anxiety disorder)   Vitamin D deficiency  Long Term Goal(s): Improvement in symptoms so as ready for discharge   Short Term Goals: Ability to verbalize feelings will improve Ability to disclose and discuss suicidal ideas Ability to identify and develop effective coping  behaviors will improve Compliance with prescribed medications will improve     Medication Management: Evaluate patient's response, side effects, and tolerance of medication regimen.  Therapeutic Interventions: 1 to 1 sessions, Unit Group sessions and Medication administration.  Evaluation of Outcomes: Progressing   RN Treatment Plan for Primary Diagnosis: Schizoaffective disorder, depressive type (HCC) Long Term Goal(s): Knowledge of disease and therapeutic regimen to maintain health will improve  Short Term Goals: Ability to remain free from injury will improve, Ability to participate in decision making will improve, Ability to verbalize feelings will improve, Ability to disclose and discuss suicidal ideas, and Ability to identify and develop effective coping behaviors will improve  Medication Management: RN will administer medications as ordered by provider, will assess and evaluate patient's response and provide education to patient for prescribed medication. RN will report any adverse and/or side effects to prescribing provider.  Therapeutic Interventions: 1 on 1 counseling sessions, Psychoeducation, Medication administration, Evaluate responses to treatment, Monitor vital signs and CBGs as ordered, Perform/monitor CIWA, COWS, AIMS and Fall Risk screenings as ordered, Perform wound care treatments as ordered.  Evaluation of Outcomes: Progressing   LCSW Treatment Plan for Primary Diagnosis: Schizoaffective disorder, depressive type (HCC) Long Term Goal(s): Safe transition to appropriate next level of care at discharge, Engage patient in therapeutic group addressing interpersonal concerns.  Short Term Goals: Engage patient in aftercare planning with referrals and resources, Increase social support, Increase emotional regulation, Facilitate acceptance of mental health diagnosis and concerns, Identify triggers associated with mental health/substance abuse issues, and Increase skills for  wellness and recovery  Therapeutic Interventions: Assess for all discharge needs, 1 to 1 time with Social worker, Explore available resources and support systems, Assess for adequacy in community support network, Educate family and significant other(s) on suicide prevention, Complete Psychosocial Assessment, Interpersonal group therapy.  Evaluation of Outcomes: Progressing   Progress in Treatment: Attending groups: Yes. Participating in groups: Yes. Taking medication as prescribed: Yes. Toleration medication: Yes. Family/Significant other contact made: Yes, individual(s) contacted:  SPE completed with Sherlean Foot, mother.  Patient understands diagnosis: No. Discussing patient identified problems/goals with staff: Yes. Medical problems stabilized or resolved: Yes. Denies suicidal/homicidal ideation: Yes. Issues/concerns per patient self-inventory: Yes. Other: none   New problem(s) identified: No, Describe:  none   New Short Term/Long Term Goal(s): Patient to work towards elimination of symptoms of psychosis, medication management for mood stabilization; elimination of SI thoughts; development of comprehensive mental wellness plan.   Patient Goals:  No additional goals identified at this time. Patient to continue to work towards original goals identified in initial treatment team meeting. CSW will remain available to patient should they voice additional treatment goals.    Discharge Plan or Barriers: No psychosocial barriers identified at this time, patient to return to place of residence when appropriate for discharge.    Reason for Continuation of Hospitalization: Depression   Estimated Length of Stay: 1-7 DAYS  Last 3  [place] Suicide Severity Risk Score: Flowsheet Row [facility] (Current[facility] from [facility]18/2022 in [facility] Office Visit from [facility]/14/2022 in [facility]  C-SSRS RISK CATEGORY No Risk High Risk No Risk       Last PHQ 2/9 Scores:    04/14/2022   10:46 AM 02/24/2022   11:12 AM 01/13/2022    9:14 AM  Depression screen PHQ 2/9  Decreased Interest 0 0 0  Down, Depressed, Hopeless 0 0 0  PHQ - 2 Score 0 0 0    Scribe for Treatment Team: Aram Beecham, Theresia Majors 05/29/2022 11:30 AM

## 2022-05-30 DIAGNOSIS — F061 Catatonic disorder due to known physiological condition: Secondary | ICD-10-CM | POA: Diagnosis not present

## 2022-05-30 DIAGNOSIS — I10 Essential (primary) hypertension: Secondary | ICD-10-CM | POA: Diagnosis not present

## 2022-05-30 DIAGNOSIS — F251 Schizoaffective disorder, depressive type: Secondary | ICD-10-CM | POA: Diagnosis not present

## 2022-05-30 DIAGNOSIS — Z20822 Contact with and (suspected) exposure to covid-19: Secondary | ICD-10-CM | POA: Diagnosis not present

## 2022-05-30 LAB — PROLACTIN: Prolactin: 138 ng/mL — ABNORMAL HIGH (ref 4.8–23.3)

## 2022-05-30 NOTE — BHH Group Notes (Signed)
.  Psychoeducational Group Note  Date: 05/30/2022 Time: 0900-1000    Goal Setting   Purpose of Group: This group helps to provide patients with the steps of setting a goal that is specific, measurable, attainable, realistic and time specific. A discussion on how we keep ourselves stuck with negative self talk. Homework given for Patients to write 30 positive attributes about themselves.    Participation Level:  Active  Participation Quality:  Appropriate  Affect:  Appropriate  Cognitive:  Appropriate  Insight:  Improving  Engagement in Group:  Engaged  Additional Comments:  Rates her energy at a 4/10. Participated fully in the group.  Ladonte Verstraete A 

## 2022-05-30 NOTE — Progress Notes (Signed)
Adult Psychoeducational Group Note  Date:  05/30/2022 Time:  9:45 AM  Group Topic/Focus:  Orientation:   The focus of this group is to educate the patient on the purpose and policies of crisis stabilization and provide a format to answer questions about their admission.  The group details unit policies and expectations of patients while admitted.  Participation Level:  Active  Participation Quality:  Appropriate  Affect:  Appropriate  Cognitive:  Appropriate  Insight: Appropriate  Engagement in Group:  Engaged  Modes of Intervention:  Discussion  Additional Comments: Patient attended group and engaged in group.  Octavio Manns 05/30/2022, 9:45 AM
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
## 2022-05-30 NOTE — Progress Notes (Signed)
Adult Psychoeducational Group Note  Date:  05/30/2022 Time:  9:22 PM  Group Topic/Focus:  Wrap-Up Group:   The focus of this group is to help patients review their daily goal of treatment and discuss progress on daily workbooks.  Participation Level:  Active  Participation Quality:  Appropriate  Affect:  Appropriate  Cognitive:  Appropriate  Insight: Appropriate  Engagement in Group:  Developing/Improving  Modes of Intervention:  Discussion  Additional Comments:  Pt stated her goal for today was to focus on her treatment plan. Pt stated she accomplished her goal today. Pt stated she talked with her doctor but did not get the chance to speak with her social worker about her care today. Pt rated her overall day a 4 out of 10. Pt stated she made no calls today. Pt stated she felt better about herself tonight. Pt stated she attend all  meals today. Pt stated she took all medications provided today. Pt stated her appetite was fair today. Pt rated her sleep last night was fair. Pt stated the goal tonight was to get some rest. Pt stated she had no physical pain tonight. Pt deny visual hallucinations and auditory issues tonight. Pt denies thoughts of harming herself or others. Pt stated she would alert staff if anything changed.   Felipa Furnace 05/30/2022, 9:22 PM

## 2022-05-30 NOTE — Progress Notes (Signed)
Pt is A&OX4, calm, isolative at times, guarded, denies suicidal ideations, denies homicidal ideations, denies auditory hallucinations and denies visual hallucinations. Pt rates her feelings of hopelessness and depression "4/10." Pt verbally agrees to approach staff if these become apparent and before harming self or others. Pt denies experiencing nightmares. Pt reports sleeping well last night. Mood and affect are congruent. Pt appetite is ok. No complaints of anxiety, distress, pain and/or discomfort at this time. Pt reports her energy level is "normal." [name] memory appears to be grossly intact, and Pt hasn't displayed any injurious behaviors. Pt is medication compliant. There's no evidence of suicidal intent. Psychomotor activity was WNL. No s/s of Parkinson, Dystonia, Akathisia and/or Tardive Dyskinesia noted.

## 2022-05-30 NOTE — Group Note (Addendum)
  [facility] LCSW Group Therapy Note  Date/Time:  05/30/2022 11:15AM-12:00PM  Type of Therapy and Topic:  Group Therapy:  Feelings About Hospitalization  Participation Level:  Active   Description of Group This process group involved patients discussing their feelings related to being hospitalized, as well as the benefits they see to being in the hospital.  These feelings and benefits were itemized.  The group then brainstormed specific ways in which they could seek those same benefits when they discharge and return home.  Therapeutic Goals Patient will identify and describe positive and negative feelings related to hospitalization Patient will verbalize benefits of hospitalization to themselves personally Patients will brainstorm together ways they can obtain similar benefits in the outpatient setting, identify barriers to wellness and possible solutions  Summary of Patient Progress:  The patient expressed her primary feelings about being hospitalized are good and she feels she has received the needed help.  She was able to list logical tasks that she will need to do in order to stay well once she leaves the hospital, including getting her rest, eating nutritious food, taking her medicine at the right times, and staying in touch with her doctor and therapist.  Therapeutic Modalities Cognitive Behavioral Therapy Motivational Interviewing    Ambrose Mantle, LCSW 05/30/2022, 11:53 AM

## 2022-05-30 NOTE — Progress Notes (Signed)
   05/30/22 0530  Sleep  Number of Hours 6.5

## 2022-05-30 NOTE — Progress Notes (Signed)
Pt is quiet, isolative at times. Slow and soft speech. Cooperative with medications. Delayed cognition.

## 2022-05-31 DIAGNOSIS — F251 Schizoaffective disorder, depressive type: Secondary | ICD-10-CM

## 2022-05-31 LAB — COMPREHENSIVE METABOLIC PANEL
ALT: 15 U/L (ref 0–44)
AST: 16 U/L (ref 15–41)
Albumin: 3.8 g/dL (ref 3.5–5.0)
Alkaline Phosphatase: 54 U/L (ref 38–126)
Anion gap: 6 (ref 5–15)
BUN: 6 mg/dL (ref 6–20)
CO2: 28 mmol/L (ref 22–32)
Calcium: 8.9 mg/dL (ref 8.9–10.3)
Chloride: 103 mmol/L (ref 98–111)
Creatinine, Ser: 0.82 mg/dL (ref 0.44–1.00)
GFR, Estimated: >60 mL/min (ref >60)
Glucose, Bld: 101 mg/dL — ABNORMAL HIGH (ref 70–99)
Potassium: 3.9 mmol/L (ref 3.5–5.1)
Sodium: 137 mmol/L (ref 135–145)
Total Bilirubin: 0.7 mg/dL (ref 0.3–1.2)
Total Protein: 7.2 g/dL (ref 6.5–8.1)

## 2022-05-31 LAB — CBC WITH DIFFERENTIAL/PLATELET
Abs Immature Granulocytes: 0.03 10*3/uL (ref 0.00–0.07)
Basophils Absolute: 0 10*3/uL (ref 0.0–0.1)
Basophils Relative: 1 %
Eosinophils Absolute: 0.1 10*3/uL (ref 0.0–0.5)
Eosinophils Relative: 2 %
HCT: 40.7 % (ref 36.0–46.0)
Hemoglobin: 13 g/dL (ref 12.0–15.0)
Immature Granulocytes: 1 %
Lymphocytes Relative: 28 %
Lymphs Abs: 1.6 10*3/uL (ref 0.7–4.0)
MCH: 26.7 pg (ref 26.0–34.0)
MCHC: 31.9 g/dL (ref 30.0–36.0)
MCV: 83.7 fL (ref 80.0–100.0)
Monocytes Absolute: 0.3 10*3/uL (ref 0.1–1.0)
Monocytes Relative: 6 %
Neutro Abs: 3.6 10*3/uL (ref 1.7–7.7)
Neutrophils Relative %: 62 %
Platelets: 273 10*3/uL (ref 150–400)
RBC: 4.86 MIL/uL (ref 3.87–5.11)
RDW: 14.4 % (ref 11.5–15.5)
WBC: 5.7 10*3/uL (ref 4.0–10.5)
nRBC: 0 % (ref 0.0–0.2)

## 2022-05-31 NOTE — [facility] Group Notes (Signed)
Adult Psychoeducational Group Note Date:  05/31/2022 Time:  0900-1045 Group Topic/Focus: PROGRESSIVE RELAXATION. A group where deep breathing is taught and tensing and relaxation muscle groups is used. Imagery is used as well.  Pts are asked to imagine 3 pillars that hold them up when they are not able to hold themselves up and to share that with the group.  Participation Level:  Active  Participation Quality:  Appropriate  Affect:  Appropriate  Cognitive:  Oriented  Insight: Improving  Engagement in Group:  Engaged  Modes of Intervention:  Activity, Discussion, Education, and Support  Additional Comments:  Rates energy at a 4/10. States the hospital, God Delorise Shiner and music.  Dione Housekeeper

## 2022-05-31 NOTE — Progress Notes (Signed)
Patient ID: Cheryl Adams, female   DOB: Jun 01, 1991, 31 y.o.   MRN: [id number] D: Patient observed in hallway this am. She is pleasant upon approach. She denies any thoughts of self harm. She is cooperative and calm. During music therapy, patient was observed crying softly. She has been attending group activities and participating in her treatment. She feels she has made progress with her treatment here. She denies any AVH.  A: Continue to monitor medication management and MD orders.  Safety checks completed every 15 minutes per protocol.  Offer support and encouragement as needed.  R: Patient is receptive to staff; her behavior is appropriate.

## 2022-05-31 NOTE — Progress Notes (Signed)
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 Abbott Pao. No behavioral PRNs required overnight. Sleep hours not documented.  Patient reports an improved mood today. She denies acute concerns or complaints. Patient reports sleeping well and an intact appetite. She denies somatic complaints today. Denies medication AE, and says that they are helping to stabilize her mood and with sleep. She has good insight into coping mechanisms to use when talking with her family when she feels misunderstood instead of lashing out. She continues to believe that her family is out to hurt her because of misunderstanding her. However, she contracts for safety on the unit and does not believe that staff is out to harm her. Despite paranoia about her family, she denies SI/HI/AVH as well as thought insertion and withdrawal. She is advised to reach out to family today to update the team tomorrow, with which she is agreeable.   Total Time spent with patient: 30 minutes  Past Psychiatric History: See H&P  Past Medical History:  Past Medical History:  Diagnosis Date   Anemia    Chronic tonsillitis 10/2014   Cough 11/09/2014   Difficulty swallowing pills     Past Surgical History:  Procedure  Laterality Date   TONSILLECTOMY AND ADENOIDECTOMY Bilateral 11/14/2014   Procedure: BILATERAL TONSILLECTOMY AND ADENOIDECTOMY;  Surgeon: Flo Shanks, MD;  Location: [facility];  Service: ENT;  Laterality: Bilateral;   TYMPANOSTOMY TUBE PLACEMENT     Family History: History reviewed. No pertinent family history. Family Psychiatric  History: See H&P Social History:  Social History   Substance and Sexual Activity  Alcohol Use No     Social History   Substance and Sexual Activity  Drug Use No    Social History   Socioeconomic History   Marital status: Single    Spouse name: Not on file   Number of children: Not on file   Years of education: Not on file   Highest education level: Not on file  Occupational History   Not on file  Tobacco Use   Smoking status: Never   Smokeless tobacco: Never  Substance and Sexual Activity   Alcohol use: No   Drug use: No   Sexual activity: Not on file  Other Topics Concern   Not on file  Social History Narrative   Not on file   Social Determinants of Health   Financial Resource Strain: Not on file  Food Insecurity: Not on file  Transportation Needs: Not on file  Physical Activity: Not on file  Stress: Not on file  Social Connections: Not on file   Additional Social History:         Sleep: Good  Appetite:  Good  Current Medications: Current Facility-Administered Medications  Medication Dose Route Frequency Provider Last Rate Last Admin   acetaminophen (TYLENOL) tablet 500 mg  500 mg Oral Q6H PRN Massengill, Harrold Donath,  MD       amLODipine (NORVASC) tablet 5 mg  5 mg Oral Daily Massengill, Nathan, MD   5 mg at 05/31/22 0758   benztropine (COGENTIN) tablet 0.5 mg  0.5 mg Oral BID PRN Massengill, Harrold DonathNathan, MD       benztropine mesylate (COGENTIN) injection 2 mg  2 mg Intramuscular BID PRN Massengill, Harrold DonathNathan, MD   2 mg at 05/28/22 1623   ferrous sulfate tablet 325 mg  325 mg Oral Daily Princess Bruinsguyen, Julie, DO   325 mg at  05/31/22 [id number]   hydrOXYzine (ATARAX) tablet 25 mg  25 mg Oral TID PRN Phineas InchesMassengill, Nathan, MD   25 mg at 05/27/22 2048   LORazepam (ATIVAN) injection 1 mg  1 mg Intramuscular Q6H PRN Massengill, Harrold DonathNathan, MD   1 mg at 05/28/22 1623   OLANZapine zydis (ZYPREXA) disintegrating tablet 5 mg  5 mg Oral Q8H PRN Massengill, Harrold DonathNathan, MD   5 mg at 05/24/22 2154   And   LORazepam (ATIVAN) tablet 1 mg  1 mg Oral Q8H PRN Massengill, Harrold DonathNathan, MD       And   ziprasidone (GEODON) injection 20 mg  20 mg Intramuscular Q8H PRN Massengill, Harrold DonathNathan, MD       melatonin tablet 5 mg  5 mg Oral QHS Nkwenti, Doris, NP   5 mg at 05/30/22 2025   sertraline (ZOLOFT) tablet 100 mg  100 mg Oral Daily Massengill, Harrold DonathNathan, MD   100 mg at 05/31/22 0758   traZODone (DESYREL) tablet 150 mg  150 mg Oral QHS Starleen BlueNkwenti, Doris, NP   150 mg at 05/30/22 2025   Vitamin D (Ergocalciferol) (DRISDOL) capsule 50,000 Units  50,000 Units Oral Q7 days Starleen BlueNkwenti, Doris, NP   50,000 Units at 05/25/22 [id number]   ziprasidone (GEODON) capsule 80 mg  80 mg Oral BID WC Starleen BlueNkwenti, Doris, NP   80 mg at 05/31/22 [id number]    Lab Results:  Results for orders placed or performed during the hospital encounter of 05/19/22 (from the past 48 hour(s))  Prolactin     Status: Abnormal   Collection Time: 05/29/22  6:22 PM  Result Value Ref Range   Prolactin 138.0 (H) 4.8 - 23.3 ng/mL    Comment: (NOTE) Performed At: [facility] [address] [place], [place] [id number] Jolene SchimkeNagendra Sanjai MD NF:[id number]:[phone]   C-reactive protein     Status: None   Collection Time: 05/29/22  6:22 PM  Result Value Ref Range   CRP 0.6 <1.0 mg/dL    Comment: Performed at [facility], [address] [place], [place] [address]  Sedimentation rate     Status: None   Collection Time: 05/29/22  6:22 PM  Result Value Ref Range   Sed Rate 12 0 - 22 mm/hr    Comment: Performed at [facility], [address] [place], [place] [address]  CK     Status: Abnormal    Collection Time: 05/29/22  6:22 PM  Result Value Ref Range   Total CK 304 (H) 38 - 234 U/L    Comment: Performed at [facility], [address] [place], [place] [address]  Brain natriuretic peptide     Status: None   Collection Time: 05/29/22  6:22 PM  Result Value Ref Range   B Natriuretic Peptide 66.6 0.0 - 100.0 pg/mL    Comment: Performed at [facility], [address] [place], [place] [address]  Comprehensive metabolic panel     Status: Abnormal  Collection Time: 05/29/22  6:22 PM  Result Value Ref Range   Sodium 140 135 - 145 mmol/L   Potassium 3.9 3.5 - 5.1 mmol/L   Chloride 106 98 - 111 mmol/L   CO2 26 22 - 32 mmol/L   Glucose, Bld 124 (H) 70 - 99 mg/dL    Comment: Glucose reference range applies only to samples taken after fasting for at least 8 hours.   BUN 9 6 - 20 mg/dL   Creatinine, Ser 4.38 0.44 - 1.00 mg/dL   Calcium 8.9 8.9 - 88.7 mg/dL   Total Protein 7.4 6.5 - 8.1 g/dL   Albumin 3.9 3.5 - 5.0 g/dL   AST 18 15 - 41 U/L   ALT 15 0 - 44 U/L   Alkaline Phosphatase 58 38 - 126 U/L   Total Bilirubin 0.6 0.3 - 1.2 mg/dL   GFR, Estimated >57 >97 mL/min    Comment: (NOTE) Calculated using the CKD-EPI Creatinine Equation (2021)    Anion gap 8 5 - 15    Comment: Performed at [facility], [address], [place], [place] [address]  CBC with Differential/Platelet     Status: None   Collection Time: 05/29/22  6:22 PM  Result Value Ref Range   WBC 6.9 4.0 - 10.5 K/uL   RBC 4.89 3.87 - 5.11 MIL/uL   Hemoglobin 13.0 12.0 - 15.0 g/dL   HCT 01.5 61.5 - 37.9 %   MCV 83.6 80.0 - 100.0 fL   MCH 26.6 26.0 - 34.0 pg   MCHC 31.8 30.0 - 36.0 g/dL   RDW 43.2 76.1 - 47.0 %   Platelets 270 150 - 400 K/uL   nRBC 0.0 0.0 - 0.2 %   Neutrophils Relative % 64 %   Neutro Abs 4.4 1.7 - 7.7 K/uL   Lymphocytes Relative 27 %   Lymphs Abs 1.9 0.7 - 4.0 K/uL   Monocytes Relative 7 %   Monocytes Absolute 0.5 0.1 - 1.0 K/uL    Eosinophils Relative 2 %   Eosinophils Absolute 0.1 0.0 - 0.5 K/uL   Basophils Relative 0 %   Basophils Absolute 0.0 0.0 - 0.1 K/uL   Immature Granulocytes 0 %   Abs Immature Granulocytes 0.03 0.00 - 0.07 K/uL    Comment: Performed at [facility], [address], [place], [place] [address]  Comprehensive metabolic panel     Status: Abnormal   Collection Time: 05/31/22  6:42 AM  Result Value Ref Range   Sodium 137 135 - 145 mmol/L   Potassium 3.9 3.5 - 5.1 mmol/L   Chloride 103 98 - 111 mmol/L   CO2 28 22 - 32 mmol/L   Glucose, Bld 101 (H) 70 - 99 mg/dL    Comment: Glucose reference range applies only to samples taken after fasting for at least 8 hours.   BUN 6 6 - 20 mg/dL   Creatinine, Ser 4.73 0.44 - 1.00 mg/dL   Calcium 8.9 8.9 - 40.3 mg/dL   Total Protein 7.2 6.5 - 8.1 g/dL   Albumin 3.8 3.5 - 5.0 g/dL   AST 16 15 - 41 U/L   ALT 15 0 - 44 U/L   Alkaline Phosphatase 54 38 - 126 U/L   Total Bilirubin 0.7 0.3 - 1.2 mg/dL   GFR, Estimated >70 >96 mL/min    Comment: (NOTE) Calculated using the CKD-EPI Creatinine Equation (2021)    Anion gap 6 5 - 15    Comment: Performed at  [facility], [address] [place], [place] [address]  CBC with Differential/Platelet     Status: None   Collection Time: 05/31/22  6:42 AM  Result Value Ref Range   WBC 5.7 4.0 - 10.5 K/uL   RBC 4.86 3.87 - 5.11 MIL/uL   Hemoglobin 13.0 12.0 - 15.0 g/dL   HCT 62.1 30.8 - 65.7 %   MCV 83.7 80.0 - 100.0 fL   MCH 26.7 26.0 - 34.0 pg   MCHC 31.9 30.0 - 36.0 g/dL   RDW 84.6 96.2 - 95.2 %   Platelets 273 150 - 400 K/uL   nRBC 0.0 0.0 - 0.2 %   Neutrophils Relative % 62 %   Neutro Abs 3.6 1.7 - 7.7 K/uL   Lymphocytes Relative 28 %   Lymphs Abs 1.6 0.7 - 4.0 K/uL   Monocytes Relative 6 %   Monocytes Absolute 0.3 0.1 - 1.0 K/uL   Eosinophils Relative 2 %   Eosinophils Absolute 0.1 0.0 - 0.5 K/uL   Basophils Relative 1 %   Basophils Absolute 0.0 0.0 - 0.1 K/uL    Immature Granulocytes 1 %   Abs Immature Granulocytes 0.03 0.00 - 0.07 K/uL    Comment: Performed at [facility], [address], [place], [place] [address]    Blood Alcohol level:  Lab Results  Component Value Date   [facility] <10 10/16/2021   ETH <10 04/24/2021    Metabolic Disorder Labs: Lab Results  Component Value Date   HGBA1C 5.2 05/18/2022   MPG 102.54 05/18/2022   MPG 120 04/29/2021   Lab Results  Component Value Date   PROLACTIN 138.0 (H) 05/29/2022   PROLACTIN 116.0 (H) 05/24/2022   Lab Results  Component Value Date   CHOL 217 (H) 05/18/2022   TRIG 31 05/18/2022   HDL 71 05/18/2022   CHOLHDL 3.1 05/18/2022   VLDL 6 05/18/2022   LDLCALC 140 (H) 05/18/2022   LDLCALC 94 05/02/2021    Physical Findings: AIMS:  Facial and Oral Movements Muscles of Facial Expression: Minimal Lips and Perioral Area: None, normal Jaw: Minimal Tongue: None, normal, Extremity Movements Upper (arms, wrists, hands, fingers): None, normal Lower (legs, knees, ankles, toes): None, normal,  Trunk Movements Neck, shoulders, hips: Moderate,  Overall Severity Severity of abnormal movements (highest score from questions above): Moderate Incapacitation due to abnormal movements: None, normal Patient's awareness of abnormal movements (rate only patient's report): Aware, mild distress,  Dental Status Current problems with teeth and/or dentures?: No Does patient usually wear dentures?: No   Musculoskeletal: Strength & Muscle Tone: within normal limits Gait & Station: normal Patient leans: N/A  Psychiatric Specialty Exam:  Presentation  General Appearance: Casual; Fairly Groomed   Eye Contact:Fair   Speech:Clear and Coherent   Speech Volume:Normal   Handedness:Right    Mood and Affect  Mood:Euthymic   Affect:Congruent    Thought Process  Thought Processes:Coherent   Descriptions of Associations:Intact   Orientation:Full (Time, Place and  Person)   Thought Content:Paranoid Ideation; WDL (Some paranoia but largely WDL.)   History of Schizophrenia/Schizoaffective disorder:Yes   Duration of Psychotic Symptoms:Greater than six months   Hallucinations:Hallucinations: None  Ideas of Reference:Paranoia   Suicidal Thoughts:Suicidal Thoughts: No  Homicidal Thoughts:Homicidal Thoughts: No   Sensorium  Memory:Immediate Good   Judgment:Fair   Insight:Fair    Executive Functions  Concentration:Good   Attention Span:Good   Recall:Fair   Fund of Knowledge:Fair   Language:Fair    Psychomotor Activity  Psychomotor  Activity:Psychomotor Activity: Normal   Assets  Assets:Communication Skills; Desire for Improvement    Sleep  Sleep:Sleep: Fair    Physical Exam: Physical Exam Constitutional:      General: She is not in acute distress.    Appearance: Normal appearance.  HENT:     Head: Normocephalic and atraumatic.     Mouth/Throat:     Mouth: Mucous membranes are moist.     Pharynx: Oropharynx is clear.  Pulmonary:     Effort: Pulmonary effort is normal.  Neurological:     Mental Status: She is alert.     Motor: No weakness.     Gait: Gait normal.    Review of Systems  Respiratory:  Negative for shortness of breath.   Cardiovascular:  Negative for chest pain.  Gastrointestinal: Negative.   Genitourinary: Negative.   Neurological:  Negative for weakness and headaches.   Blood pressure 127/83, pulse 83, temperature 98.1 F (36.7 C), temperature source Oral, resp. rate 20, height 5\' 5"  (1.651 m), weight 108.4 kg, SpO2 100 %. Body mass index is 39.77 kg/m.   Treatment Plan Summary: ASSESSMENT:   Principal Problem:   Schizoaffective disorder, depressive type (HCC) Active Problems:  Insomnia GAD (generalized anxiety disorder) Vitamin D deficiency       PLAN: Safety and Monitoring: Voluntary admission to inpatient psychiatric unit for safety, stabilization and  treatment Daily contact with patient to assess and evaluate symptoms and progress in treatment Patient's case to be discussed in multi-disciplinary team meeting Observation Level : q15 minute checks Vital signs: q12 hours Precautions: Safety   2. 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 Medical Diagnoses and Treatment: #Vitamin D Deficiency -Continue Vitamin D 50.000 units every 7 days x 5 total doses  #Misc -Continue Ensure TID for nutritional support   4. Discharge Planning:  Social work and case management to assist with discharge planning and identification of hospital follow-up needs prior to discharge Estimated LOS: 5-7 days Discharge Concerns: Need to establish a safety plan; Medication compliance and effectiveness Discharge Goals: Return home with outpatient referrals for mental health follow-up including medication management/psychotherapy    , MD 05/31/2022, 12:09 PM

## 2022-05-31 NOTE — Progress Notes (Signed)
Adult Psychoeducational Group Note  Date:  05/31/2022 Time:  9:31 PM  Group Topic/Focus:  Wrap-Up Group:   The focus of this group is to help patients review their daily goal of treatment and discuss progress on daily workbooks.  Participation Level:  Active  Participation Quality:  Appropriate  Affect:  Appropriate  Cognitive:  Appropriate  Insight: Appropriate  Engagement in Group:  Developing/Improving  Modes of Intervention:  Discussion  Additional Comments:  Pt stated her goal for today was to focus on her treatment plan. Pt stated she accomplished her goal today. Pt stated she talked with her doctor and with her social worker about her care today. Pt rated her overall day a 4 out of 10. Pt stated she really enjoyed music. Pt stated she made no calls today. Pt stated she felt better about herself tonight. Pt stated she attend all  meals today. Pt stated she took all medications provided today. Pt stated her appetite was fair today. Pt rated her sleep last night was fair. Pt stated the goal tonight was to get some rest. Pt stated she had no physical pain tonight. Pt deny visual hallucinations and auditory issues tonight. Pt denies thoughts of harming herself or others. Pt stated she would alert staff if anything changed.   Felipa Furnace 05/31/2022, 9:31 PM

## 2022-05-31 NOTE — Group Note (Signed)
[facility] LCSW Group Therapy Note  Date/Time:  05/31/2022  11:00AM-12:00PM  Type of Therapy and Topic:  Group Therapy:  Music and Mood  Participation Level:  Active   Description of Group: In this process group, members listened to a variety of genres of music and identified that different types of music evoke different responses.  Patients were encouraged to identify music that was soothing for them and music that was energizing for them.  Patients discussed how this knowledge can help with wellness and recovery in various ways including managing depression and anxiety as well as encouraging healthy sleep habits.    Therapeutic Goals: Patients will explore the impact of different varieties of music on mood Patients will verbalize the thoughts they have when listening to different types of music Patients will identify music that is soothing to them as well as music that is energizing to them Patients will discuss how to use this knowledge to assist in maintaining wellness and recovery Patients will explore the use of music as a coping skill  Summary of Patient Progress:  At the beginning of group, patient expressed her mood was alright.  At the end of group, patient expressed her mood was inspired.  She participated fully and enthusiastically..    Therapeutic Modalities: Solution Focused Brief Therapy Activity   Ambrose Mantle, LCSW

## 2022-05-31 NOTE — Progress Notes (Signed)

## 2022-05-31 NOTE — Progress Notes (Signed)
   05/31/22 1000  Psych Admission Type (Psych Patients Only)  Admission Status Voluntary  Psychosocial Assessment  Patient Complaints None  Eye Contact Fair  Facial Expression Fixed smile  Affect Apprehensive  Speech Soft  Interaction Forwards little  Motor Activity Other (Comment) (wnl)  Appearance/Hygiene Unremarkable  Behavior Characteristics Cooperative  Mood Depressed  Thought Process  Coherency WDL  Content WDL  Delusions None reported or observed  Perception WDL  Hallucination None reported or observed  Judgment Poor  Confusion None  Danger to Self  Current suicidal ideation? Denies  Self-Injurious Behavior No self-injurious ideation or behavior indicators observed or expressed   Agreement Not to Harm Self Yes  Danger to Others  Danger to Others None reported or observed

## 2022-06-01 DIAGNOSIS — F333 Major depressive disorder, recurrent, severe with psychotic symptoms: Secondary | ICD-10-CM | POA: Diagnosis not present

## 2022-06-01 MED ORDER — OLANZAPINE 10 MG PO TBDP
10.0000 mg | ORAL_TABLET | Freq: Every day | ORAL | Status: DC
  Administered 2022-06-01 – 2022-06-02 (×2): 10 mg via ORAL
  Filled 2022-06-01 (×4): qty 1

## 2022-06-01 NOTE — Progress Notes (Signed)
   06/01/22 2215  Psych Admission Type (Psych Patients Only)  Admission Status Voluntary  Psychosocial Assessment  Patient Complaints Suspiciousness  Eye Contact Avertive  Facial Expression Fixed smile  Affect Preoccupied  Speech Soft  Interaction Minimal  Motor Activity Slow  Appearance/Hygiene Disheveled  Behavior Characteristics Cooperative  Mood Preoccupied  Aggressive Behavior  Effect No apparent injury  Thought Process  Coherency Circumstantial  Content Preoccupation  Delusions Paranoid  Perception Hallucinations  Hallucination Auditory;Visual  Judgment Poor  Confusion None  Danger to Self  Current suicidal ideation? Denies  Danger to Others  Danger to Others None reported or observed

## 2022-06-01 NOTE — Progress Notes (Signed)
Adult Psychoeducational Group Note  Date:  06/01/2022 Time:  9:17 AM  Group Topic/Focus:  Building Self Esteem:   The Focus of this group is helping patients become aware of the effects of self-esteem on their lives, the things they and others do that enhance or undermine their self-esteem, seeing the relationship between their level of self-esteem and the choices they make and learning ways to enhance self-esteem.  Participation Level:  Active  Participation Quality:  Appropriate  Affect:  Appropriate  Cognitive:  Appropriate  Insight: Appropriate  Engagement in Group:  Engaged  Modes of Intervention:  Discussion  Additional Comments: The patient engaged in group.  Octavio Manns 06/01/2022, 9:17 AM

## 2022-06-01 NOTE — Group Note (Signed)
LCSW Group Therapy Note  Group Date: 06/01/2022 Start Time: 1300 End Time: 1400   Type of Therapy and Topic:  Group Therapy: Anger Cues and Responses  Participation Level:  Active   Description of Group:   In this group, patients learned how to recognize the physical, cognitive, emotional, and behavioral responses they have to anger-provoking situations.  They identified a recent time they became angry and how they reacted.  They analyzed how their reaction was possibly beneficial and how it was possibly unhelpful.  The group discussed a variety of healthier coping skills that could help with such a situation in the future.  Focus was placed on how helpful it is to recognize the underlying emotions to our anger, because working on those can lead to a more permanent solution as well as our ability to focus on the important rather than the urgent.  Therapeutic Goals: Patients will remember their last incident of anger and how they felt emotionally and physically, what their thoughts were at the time, and how they behaved. Patients will identify how their behavior at that time worked for them, as well as how it worked against them. Patients will explore possible new behaviors to use in future anger situations. Patients will learn that anger itself is normal and cannot be eliminated, and that healthier reactions can assist with resolving conflict rather than worsening situations.  Summary of Patient Progress:  Cheryl Adams was active during the group. She shared a recent occurrence wherein feeling hurting herself by stubbing her toe led to anger. She demonstrated good insight into the subject matter, was respectful of peers, and participated throughout the entire session.  Therapeutic Modalities:   Cognitive Behavioral Therapy    Beatris Si, LCSW 06/01/2022  2:06 PM

## 2022-06-01 NOTE — Progress Notes (Signed)
Adult Psychoeducational Group Note  Date:  06/01/2022 Time:  10:30 PM  Group Topic/Focus:  Wrap-Up Group:   The focus of this group is to help patients review their daily goal of treatment and discuss progress on daily workbooks.  Participation Level:  Active  Participation Quality:  Appropriate  Affect:  Appropriate  Cognitive:  Appropriate  Insight: Appropriate  Engagement in Group:  Developing/Improving  Modes of Intervention:  Discussion  Additional Comments:   Pt stated her goal for today was to focus on her treatment plan. Pt stated she accomplished her goal today. Pt stated she talked with her doctor and with her social worker about her care today. Pt rated her overall day a 4 out of 10. Pt stated she really enjoyed going outside with her peers today. Pt stated she made no calls today. Pt stated she felt better about herself tonight. Pt stated she attend all  meals today. Pt stated she took all medications provided today. Pt stated her appetite was fair today. Pt rated her sleep last night was fair. Pt stated the goal tonight was to get some rest. Pt stated she had no physical pain tonight. Pt deny visual hallucinations and auditory issues tonight. Pt denies thoughts of harming herself or others. Pt stated she would alert staff if anything changed.   Felipa Furnace 06/01/2022, 10:30 PM

## 2022-06-01 NOTE — Progress Notes (Signed)
   05/31/22 2100  Psych Admission Type (Psych Patients Only)  Admission Status Voluntary  Psychosocial Assessment  Patient Complaints None  Eye Contact Poor  Facial Expression Fixed smile  Affect Appropriate to circumstance;Preoccupied  Speech Soft  Interaction Minimal  Motor Activity Other (Comment) (wnl)  Appearance/Hygiene Unremarkable  Behavior Characteristics Appropriate to situation;Cooperative;Guarded  Mood Pleasant;Preoccupied  Thought Process  Coherency Circumstantial  Content Preoccupation  Delusions Paranoid  Perception Illusions  Hallucination Auditory;Visual  Judgment Poor  Confusion None  Danger to Self  Current suicidal ideation? Denies    
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
## 2022-06-01 NOTE — Progress Notes (Signed)
   05/31/22 2100  Psych Admission Type (Psych Patients Only)  Admission Status Voluntary  Psychosocial Assessment  Patient Complaints None  Eye Contact Poor  Facial Expression Fixed smile  Affect Appropriate to circumstance;Preoccupied  Speech Soft  Interaction Minimal  Motor Activity Other (Comment) (wnl)  Appearance/Hygiene Unremarkable  Behavior Characteristics Appropriate to situation;Cooperative;Guarded  Mood Pleasant;Preoccupied  Thought Process  Coherency Circumstantial  Content Preoccupation  Delusions Paranoid  Perception Illusions  Hallucination Auditory;Visual  Judgment Poor  Confusion None  Danger to Self  Current suicidal ideation? Denies

## 2022-06-02 DIAGNOSIS — F333 Major depressive disorder, recurrent, severe with psychotic symptoms: Secondary | ICD-10-CM | POA: Diagnosis not present

## 2022-06-02 MED ORDER — WHITE PETROLATUM EX OINT
TOPICAL_OINTMENT | CUTANEOUS | Status: AC
  Filled 2022-06-02: qty 5

## 2022-06-02 NOTE — [facility] Group Notes (Signed)
Patient attended orientation group and completed self inventory sheet.  

## 2022-06-02 NOTE — Progress Notes (Signed)
The focus of this group is to help patients review their daily goal of treatment and discuss progress on daily workbooks.  Pt attended the evening group and responded to all discussion prompts from the Writer. Pt shared that today was an "okay" day on the unit, the highlight of which was playing basketball with her peers in the gym.  Pt told that her goal this week was to continue communicating with peers/staff and participating in unit activities.  Pt rated her day a 4 out of 10 and her affect was appropriate.
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
## 2022-06-02 NOTE — Progress Notes (Signed)
   06/02/22 0545  Sleep  Number of Hours 5.75

## 2022-06-02 NOTE — Progress Notes (Signed)
Pt visible in the dayroom much of the evening, pt continues to be paranoid, but seems to appear less paranoid     06/02/22 2115  Psych Admission Type (Psych Patients Only)  Admission Status Voluntary  Psychosocial Assessment  Patient Complaints Suspiciousness  Eye Contact Avertive  Facial Expression Fixed smile  Affect Preoccupied  Speech Soft  Interaction Minimal  Motor Activity Slow  Appearance/Hygiene Disheveled  Behavior Characteristics Cooperative  Mood Preoccupied  Aggressive Behavior  Effect No apparent injury  Thought Process  Coherency Circumstantial  Content Preoccupation  Delusions Paranoid  Perception Hallucinations  Hallucination Auditory;Visual  Judgment Poor  Confusion None  Danger to Self  Current suicidal ideation? Denies  Danger to Others  Danger to Others None reported or observed

## 2022-06-03 ENCOUNTER — Encounter (HOSPITAL_COMMUNITY): Payer: Self-pay

## 2022-06-03 DIAGNOSIS — F333 Major depressive disorder, recurrent, severe with psychotic symptoms: Secondary | ICD-10-CM | POA: Diagnosis not present

## 2022-06-03 MED ORDER — METFORMIN HCL ER 500 MG PO TB24
500.0000 mg | ORAL_TABLET | Freq: Every day | ORAL | Status: DC
  Administered 2022-06-04 – 2022-09-10 (×99): 500 mg via ORAL
  Filled 2022-06-03 (×103): qty 1

## 2022-06-03 MED ORDER — OLANZAPINE 15 MG PO TBDP
15.0000 mg | ORAL_TABLET | Freq: Every day | ORAL | Status: DC
  Administered 2022-06-03 – 2022-06-05 (×3): 15 mg via ORAL
  Filled 2022-06-03 (×4): qty 1

## 2022-06-03 NOTE — Group Note (Signed)
Recreation Therapy Group Note   Group Topic:Problem Solving  Group Date: 06/03/2022 Start Time: 1005 End Time: 1030 Facilitators: Caroll Rancher, LRT,CTRS Location: [address] Dayroom   Goal Area(s) Addresses:  Patient will effectively work with peer towards shared goal.  Patient will identify skills used to make activity successful.  Patient will identify how skills used during activity can be used to reach post d/c goals.    Group Description:  Straw Bridge. In teams of 3-5, patients were given 15 plastic drinking straws and an equal length of masking tape. Using the materials provided, patients were instructed to build a free standing bridge-like structure to suspend an everyday item (ex: puzzle box) off of the floor or table surface. All materials were required to be used by the team in their design. LRT facilitated post-activity discussion reviewing team process. Patients were encouraged to reflect how the skills used in this activity can be generalized to daily life post discharge.   Affect/Mood: Appropriate   Participation Level: Engaged   Participation Quality: Independent   Behavior: Appropriate   Speech/Thought Process: Focused   Insight: Good   Judgement: Good   Modes of Intervention: Problem-solving   Patient Response to Interventions:  Engaged   Education Outcome:  Acknowledges education and In group clarification offered    Clinical Observations/Individualized Feedback:  Pt was quiet but worked well with peer.  Pt followed peers suggestions.  Pt did not engage in processing.  Pt was observant and pleasant.     Plan: Continue to engage patient in RT group sessions 2-3x/week.   Caroll Rancher, LRT,CTRS  06/03/2022 12:51 PM

## 2022-06-03 NOTE — Progress Notes (Signed)
   06/03/22 1945  Psych Admission Type (Psych Patients Only)  Admission Status Voluntary  Psychosocial Assessment  Patient Complaints Suspiciousness  Eye Contact Avertive  Facial Expression Fixed smile  Affect Preoccupied  Speech Soft  Interaction Minimal  Motor Activity Slow  Appearance/Hygiene Disheveled  Behavior Characteristics Cooperative  Mood Preoccupied  Aggressive Behavior  Effect No apparent injury  Thought Process  Coherency Circumstantial  Content Preoccupation  Delusions Paranoid  Perception Hallucinations  Hallucination Auditory;Visual  Judgment Poor  Confusion None  Danger to Self  Current suicidal ideation? Denies  Danger to Others  Danger to Others None reported or observed

## 2022-06-03 NOTE — BH IP Treatment Plan (Signed)
Interdisciplinary Treatment and Diagnostic Plan Update  06/03/2022 Time of Session: 10:35am  Cheryl Adams MRN: [id number]  Principal Diagnosis: Schizoaffective disorder, depressive type [facility])  Secondary Diagnoses: Principal Problem:   Schizoaffective disorder, depressive type (HCC) Active Problems:   Insomnia   GAD (generalized anxiety disorder)   Vitamin D deficiency   Current Medications:  Current Facility-Administered Medications  Medication Dose Route Frequency Provider Last Rate Last Admin   acetaminophen (TYLENOL) tablet 500 mg  500 mg Oral Q6H PRN Massengill, Harrold Donath, MD       amLODipine (NORVASC) tablet 5 mg  5 mg Oral Daily Massengill, Nathan, MD   5 mg at 06/03/22 0802   benztropine (COGENTIN) tablet 0.5 mg  0.5 mg Oral BID PRN Massengill, Harrold Donath, MD       benztropine mesylate (COGENTIN) injection 2 mg  2 mg Intramuscular BID PRN Massengill, Harrold Donath, MD   2 mg at 05/28/22 1623   ferrous sulfate tablet 325 mg  325 mg Oral Daily Princess Bruins, DO   325 mg at 06/03/22 0802   hydrOXYzine (ATARAX) tablet 25 mg  25 mg Oral TID PRN Phineas Inches, MD   25 mg at 06/02/22 2055   LORazepam (ATIVAN) injection 1 mg  1 mg Intramuscular Q6H PRN Massengill, Harrold Donath, MD   1 mg at 05/28/22 1623   OLANZapine zydis (ZYPREXA) disintegrating tablet 5 mg  5 mg Oral Q8H PRN Massengill, Harrold Donath, MD   5 mg at 05/24/22 2154   And   LORazepam (ATIVAN) tablet 1 mg  1 mg Oral Q8H PRN Massengill, Harrold Donath, MD       And   ziprasidone (GEODON) injection 20 mg  20 mg Intramuscular Q8H PRN Massengill, Nathan, MD       melatonin tablet 5 mg  5 mg Oral QHS Nkwenti, Doris, NP   5 mg at 06/02/22 2055   OLANZapine zydis (ZYPREXA) disintegrating tablet 10 mg  10 mg Oral QHS Massengill, Harrold Donath, MD   10 mg at 06/02/22 2055   sertraline (ZOLOFT) tablet 100 mg  100 mg Oral Daily Massengill, Harrold Donath, MD   100 mg at 06/03/22 0802   traZODone (DESYREL) tablet 150 mg  150 mg Oral QHS Starleen Blue, NP   150 mg at 06/02/22  2055   Vitamin D (Ergocalciferol) (DRISDOL) capsule 50,000 Units  50,000 Units Oral Q7 days Starleen Blue, NP   50,000 Units at 06/01/22 0809   PTA Medications: Medications Prior to Admission  Medication Sig Dispense Refill Last Dose   ferrous sulfate 325 (65 FE) MG tablet Take 325 mg by mouth daily.      risperiDONE (RISPERDAL) 1 MG tablet Take 1 tablet (1 mg total) by mouth daily.      risperiDONE (RISPERDAL) 2 MG tablet Take 1 tablet (2 mg total) by mouth at bedtime.       Patient Stressors: Health problems   Marital or family conflict   Medication change or noncompliance    Patient Strengths: Average or above average intelligence  Supportive family/friends   Treatment Modalities: Medication Management, Group therapy, Case management,  1 to 1 session with clinician, Psychoeducation, Recreational therapy.   Physician Treatment Plan for Primary Diagnosis: Schizoaffective disorder, depressive type (HCC) Long Term Goal(s): Improvement in symptoms so as ready for discharge   Short Term Goals: Ability to verbalize feelings will improve Ability to disclose and discuss suicidal ideas Ability to identify and develop effective coping behaviors will improve Compliance with prescribed medications will improve  Medication Management: Evaluate patient's response,  side effects, and tolerance of medication regimen.  Therapeutic Interventions: 1 to 1 sessions, Unit Group sessions and Medication administration.  Evaluation of Outcomes: Progressing  Physician Treatment Plan for Secondary Diagnosis: Principal Problem:   Schizoaffective disorder, depressive type (HCC) Active Problems:   Insomnia   GAD (generalized anxiety disorder)   Vitamin D deficiency  Long Term Goal(s): Improvement in symptoms so as ready for discharge   Short Term Goals: Ability to verbalize feelings will improve Ability to disclose and discuss suicidal ideas Ability to identify and develop effective coping  behaviors will improve Compliance with prescribed medications will improve     Medication Management: Evaluate patient's response, side effects, and tolerance of medication regimen.  Therapeutic Interventions: 1 to 1 sessions, Unit Group sessions and Medication administration.  Evaluation of Outcomes: Progressing   RN Treatment Plan for Primary Diagnosis: Schizoaffective disorder, depressive type (HCC) Long Term Goal(s): Knowledge of disease and therapeutic regimen to maintain health will improve  Short Term Goals: Ability to remain free from injury will improve, Ability to participate in decision making will improve, Ability to verbalize feelings will improve, Ability to disclose and discuss suicidal ideas, and Ability to identify and develop effective coping behaviors will improve  Medication Management: RN will administer medications as ordered by provider, will assess and evaluate patient's response and provide education to patient for prescribed medication. RN will report any adverse and/or side effects to prescribing provider.  Therapeutic Interventions: 1 on 1 counseling sessions, Psychoeducation, Medication administration, Evaluate responses to treatment, Monitor vital signs and CBGs as ordered, Perform/monitor CIWA, COWS, AIMS and Fall Risk screenings as ordered, Perform wound care treatments as ordered.  Evaluation of Outcomes: Progressing   LCSW Treatment Plan for Primary Diagnosis: Schizoaffective disorder, depressive type (HCC) Long Term Goal(s): Safe transition to appropriate next level of care at discharge, Engage patient in therapeutic group addressing interpersonal concerns.  Short Term Goals: Engage patient in aftercare planning with referrals and resources, Increase social support, Increase emotional regulation, Facilitate acceptance of mental health diagnosis and concerns, Identify triggers associated with mental health/substance abuse issues, and Increase skills for  wellness and recovery  Therapeutic Interventions: Assess for all discharge needs, 1 to 1 time with Social worker, Explore available resources and support systems, Assess for adequacy in community support network, Educate family and significant other(s) on suicide prevention, Complete Psychosocial Assessment, Interpersonal group therapy.  Evaluation of Outcomes: Progressing   Progress in Treatment: Attending groups: Yes. Participating in groups: Yes. Taking medication as prescribed: Yes. Toleration medication: Yes. Family/Significant other contact made: Yes, individual(s) contacted:  SPE completed with Sherlean Foot, mother.  Patient understands diagnosis: No. Discussing patient identified problems/goals with staff: Yes. Medical problems stabilized or resolved: Yes. Denies suicidal/homicidal ideation: Yes. Issues/concerns per patient self-inventory: Yes. Other: none   New problem(s) identified: No, Describe:  none   New Short Term/Long Term Goal(s): Patient to work towards elimination of symptoms of psychosis, medication management for mood stabilization; elimination of SI thoughts; development of comprehensive mental wellness plan.   Patient Goals:  No additional goals identified at this time. Patient to continue to work towards original goals identified in initial treatment team meeting. CSW will remain available to patient should they voice additional treatment goals.    Discharge Plan or Barriers: No psychosocial barriers identified at this time, patient to return to place of residence when appropriate for discharge.    Reason for Continuation of Hospitalization: Depression   Estimated Length of Stay: 1-7 DAYS   Last 3 [place]  Suicide Severity Risk Score: Flowsheet Row Admission (Current) from 05/19/2022 in [place] [facility] ED from 05/18/2022 in [facility] Office Visit from 04/14/2022 in [place] No Risk High Risk No Risk       Last PHQ 2/9 Scores:    04/14/2022   10:46 AM 02/24/2022   11:12 AM 01/13/2022    9:14 AM  Depression screen PHQ 2/9  Decreased Interest 0 0 0  Down, Depressed, Hopeless 0 0 0  PHQ - 2 Score 0 0 0    Scribe for Treatment Team: Darleen Crocker, Latanya Presser 06/03/2022 11:53 AM

## 2022-06-03 NOTE — Progress Notes (Signed)
   06/03/22 0500  Sleep  Number of Hours 7

## 2022-06-03 NOTE — Group Note (Signed)
Type of Therapy and Topic:  Group Therapy:  Stress Management   Participation Level:  Active    Description of Group:  Patients in this group were introduced to the idea of stress and encouraged to discuss negative and positive ways to manage stress. Patients discussed specific stressors that they have in their life right now and the physical signs and symptoms associated with that stress.  Patient encouraged to come up with positive changes to assist with the stress upon discharge in order to prevent future hospitalizations.   They also worked as a group on developing a specific plan for several patients to deal with stressors through boundary-setting, psychoeducation and self care techniques   Therapeutic Goals:               1)  To discuss the positive and negative impacts of stress             2)  identify signs and symptoms of stress             3)  generate ideas for stress management             4)  offer mutual support to others regarding stress management             5)  Developing plans for ways to manage specific stressors upon discharge               Summary of Patient Progress:  Patient actively participated in group.  Patient discussed that she often listens to music to cope with stress.  Patient actively participated in discussion about stress management and had good insight into group topic.    Therapeutic Modalities:   Motivational Interviewing Brief Solution-Focused Therapy   Riki Berninger, LCSW, LCAS Clincal Social Worker  [facility]

## 2022-06-03 NOTE — Progress Notes (Signed)
The focus of this group is to help patients review their daily goal of treatment and discuss progress on daily workbooks.  Pt attended the evening group and responded to all discussion prompts from the Writer. Pt shared that today was a good day on the unit, the highlight of which was again going to the gym for physical exercise - just as yesterday.  Pt told that, upon discharge, she planned to stay well by practicing coping skills when she feels anxious. "My breathing techniques help a lot, so I'm going to use those."  Pt rated her day a 4 out of 10 and her affect was appropriate.
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
## 2022-06-04 DIAGNOSIS — F333 Major depressive disorder, recurrent, severe with psychotic symptoms: Secondary | ICD-10-CM | POA: Diagnosis not present

## 2022-06-04 NOTE — Progress Notes (Signed)
Pt denies SI/HI/AVH and verbally agrees to approach staff if these become apparent or before harming themselves/others. Rates depression 4/10. Rates anxiety 4/10. Rates pain 0/10.  Pt has been out of her room for the majority of the day. Pt has sat on the floor multiple times throughout the day. Pt will rock side to side and has been seen talking to herself. Pt denies being paranoid of others but still shows signs of slight paranoia. Scheduled medications administered to pt, per MD orders. RN provided support and encouragement to pt. Q15 min safety checks implemented and continued. Pt safe on the unit. RN will continue to monitor and intervene as needed.   06/04/22 0758  Psych Admission Type (Psych Patients Only)  Admission Status Voluntary  Psychosocial Assessment  Patient Complaints None  Eye Contact Fair  Facial Expression Flat  Affect Preoccupied  Speech Soft;Logical/coherent  Interaction Forwards little;Minimal  Motor Activity Fidgety  Appearance/Hygiene Unremarkable  Behavior Characteristics Cooperative;Calm  Mood Pleasant;Preoccupied  Thought Process  Coherency Circumstantial  Content Preoccupation  Delusions Paranoid  Perception Hallucinations  Hallucination None reported or observed  Judgment Poor  Confusion None  Danger to Self  Current suicidal ideation? Denies  Danger to Others  Danger to Others None reported or observed
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
## 2022-06-04 NOTE — Group Note (Signed)
Recreation Therapy Group Note   Group Topic:Communication  Group Date: 06/04/2022 Start Time: 1000 End Time: 1040 Facilitators: Caroll Rancher, LRT,CTRS Location: [address] Dayroom   Goal Area(s) Addresses:  Patient will effectively listen to complete activity.  Patient will identify communication skills used to make activity successful.  Patient will identify how skills used during activity can be used to reach post d/c goals.    Group Description:  Geometric Drawings.  Three volunteers from the peer group will be shown an abstract picture with a particular arrangement of geometrical shapes.  Each round, one 'speaker' will describe the pattern, as accurately as possible without revealing the image to the group.  The remaining group members will listen and draw the picture to reflect how it is described to them. Patients with the role of 'listener' cannot ask clarifying questions but, may request that the speaker repeat a direction. Once the drawings are complete, the presenter will show the rest of the group the picture and compare how close each person came to drawing the picture. LRT will facilitate a post-activity discussion regarding effective communication and the importance of planning, listening, and asking for clarification in daily interactions with others.   Affect/Mood: Appropriate   Participation Level: Engaged   Participation Quality: Independent   Behavior: Appropriate   Speech/Thought Process: Focused   Insight: Good   Judgement: Good   Modes of Intervention: Activity   Patient Response to Interventions:  Engaged   Education Outcome:  Acknowledges education and In group clarification offered    Clinical Observations/Individualized Feedback:  Pt was quiet but engaged when prompted.  Pt identified music and sign language as ways to communicate.  Pt was one of the presenters during the activity.  Pt expressed she could have given better instructions to the group.      Plan: Continue to engage patient in RT group sessions 2-3x/week.   Caroll Rancher, LRT,CTRS 06/04/2022 12:44 PM

## 2022-06-04 NOTE — Progress Notes (Signed)
   06/04/22 2015  Psych Admission Type (Psych Patients Only)  Admission Status Voluntary  Psychosocial Assessment  Patient Complaints Suspiciousness  Eye Contact Avertive  Facial Expression Fixed smile  Affect Preoccupied  Speech Soft  Interaction Minimal  Motor Activity Slow  Appearance/Hygiene Disheveled  Behavior Characteristics Cooperative  Mood Preoccupied;Pleasant  Aggressive Behavior  Effect No apparent injury  Thought Process  Coherency Circumstantial  Content Preoccupation  Delusions Paranoid  Perception Hallucinations  Hallucination Auditory;Visual  Judgment Poor  Confusion None  Danger to Self  Current suicidal ideation? Denies  Danger to Others  Danger to Others None reported or observed

## 2022-06-04 NOTE — Progress Notes (Signed)
   06/04/22 0515  Sleep  Number of Hours 7.5

## 2022-06-05 DIAGNOSIS — F333 Major depressive disorder, recurrent, severe with psychotic symptoms: Secondary | ICD-10-CM | POA: Diagnosis not present

## 2022-06-05 MED ORDER — CHLORPROMAZINE HCL 25 MG PO TABS
25.0000 mg | ORAL_TABLET | Freq: Two times a day (BID) | ORAL | Status: DC
  Administered 2022-06-05 – 2022-06-06 (×3): 25 mg via ORAL
  Filled 2022-06-05 (×6): qty 1

## 2022-06-05 NOTE — Progress Notes (Signed)
   06/05/22 1100  Psych Admission Type (Psych Patients Only)  Admission Status Voluntary  Psychosocial Assessment  Patient Complaints Suspiciousness  Eye Contact Fair  Facial Expression Flat  Affect Preoccupied  Speech Soft;Logical/coherent  Interaction Minimal;Forwards little  Motor Activity Slow  Appearance/Hygiene Improved  Behavior Characteristics Cooperative  Mood Preoccupied;Pleasant  Aggressive Behavior  Effect No apparent injury  Thought Process  Coherency Circumstantial  Content Preoccupation  Delusions Paranoid  Perception Hallucinations  Hallucination Auditory  Judgment Poor  Confusion None  Danger to Self  Current suicidal ideation? Denies  Danger to Others  Danger to Others None reported or observed

## 2022-06-05 NOTE — Progress Notes (Signed)
   06/05/22 2015  Psych Admission Type (Psych Patients Only)  Admission Status Voluntary  Psychosocial Assessment  Patient Complaints Suspiciousness  Eye Contact Avertive  Facial Expression Fixed smile  Affect Preoccupied  Speech Soft  Interaction Minimal  Motor Activity Slow  Appearance/Hygiene Disheveled  Behavior Characteristics Cooperative  Mood Preoccupied  Aggressive Behavior  Effect No apparent injury  Thought Process  Coherency Circumstantial  Content Preoccupation  Delusions Paranoid  Perception Hallucinations  Hallucination Auditory;Visual  Judgment Poor  Confusion None  Danger to Self  Current suicidal ideation? Denies  Danger to Others  Danger to Others None reported or observed

## 2022-06-05 NOTE — Progress Notes (Signed)
SPIRITUALITY GROUP NOTE  Spirituality group facilitated by Chaplain Haden Cavenaugh, MDiv, B[facility].  Group Description:  Group focused on topic of hope.  Patients participated in facilitated discussion around topic, connecting with one another around experiences and definitions for hope.  Group members engaged with visual explorer photos, reflecting on what hope looks like for them today.  Group engaged in discussion around how their definitions of hope are present today in hospital.   Modalities: Psycho-social ed, Adlerian, Narrative, MI Patient Progress: Did not attend 

## 2022-06-05 NOTE — Group Note (Signed)
[facility] LCSW Group Therapy Note   Group Date: 06/05/2022 Start Time: 1300 End Time: 1400   Type of Therapy/Topic:  Group Therapy:  Emotion Regulation  Participation Level:  Minimal    Description of Group:    The purpose of this group is to assist patients in learning to regulate negative emotions and experience positive emotions. Patients will be guided to discuss ways in which they have been vulnerable to their negative emotions. These vulnerabilities will be juxtaposed with experiences of positive emotions or situations, and patients challenged to use positive emotions to combat negative ones. Special emphasis will be placed on coping with negative emotions in conflict situations, and patients will process healthy conflict resolution skills.  Therapeutic Goals: Patient will identify two positive emotions or experiences to reflect on in order to balance out negative emotions:  Patient will label two or more emotions that they find the most difficult to experience:  Patient will be able to demonstrate positive conflict resolution skills through discussion or role plays:   Summary of Patient Progress:   Patient participated in introductions and started DBT activity to regulate emotions but left halfway through group.  Patietn discussed feeling sleepy and trying to listen to her body.     Therapeutic Modalities:   Cognitive Behavioral Therapy Feelings Identification Dialectical Behavioral Therapy   Nael Petrosyan E Artavious Trebilcock, LCSW

## 2022-06-05 NOTE — Progress Notes (Signed)
   06/05/22 0500  Sleep  Number of Hours 7    

## 2022-06-05 NOTE — Group Note (Signed)
Recreation Therapy Group Note   Group Topic:Team Building  Group Date: 06/05/2022 Start Time: 1002 End Time: 1033 Facilitators: Caroll Rancher, LRT,CTRS Location: [address] Dayroom   Goal Area(s) Addresses:  Patient will effectively work with peer towards shared goal.  Patient will identify skill used to make activity successful.  Patient will identify how skills used during activity can be used to reach post d/c goals.   Group Description:  Patient(s) were given a set of solo cups, a rubber band, and some tied strings. The objective is to build a pyramid with the cups by only using the rubber band and string to move the cups. After the activity the patient(s) and LRT debriefed and discussed what strategies worked, what didn't, and what lessons they can take from the activity and use in life post discharge.    Affect/Mood: Flat   Participation Level: Engaged   Participation Quality: Moderate Cues   Behavior: Appropriate   Speech/Thought Process: Focused   Insight: Fair   Judgement: Fair    Modes of Intervention: Team-building   Patient Response to Interventions:  Engaged   Education Outcome:  Acknowledges education and In group clarification offered    Clinical Observations/Individualized Feedback: Pt worked well with peers.  Pt followed along peers guided the group on what to do.  Pt needed constant redirection to stop using her hands flipping the cups over.  Pt was appropriate during group session.    Plan: Continue to engage patient in RT group sessions 2-3x/week.   Caroll Rancher, LRT,CTRS 06/05/2022 12:51 PM
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
## 2022-06-06 DIAGNOSIS — Z20822 Contact with and (suspected) exposure to covid-19: Secondary | ICD-10-CM | POA: Diagnosis not present

## 2022-06-06 DIAGNOSIS — F061 Catatonic disorder due to known physiological condition: Secondary | ICD-10-CM | POA: Diagnosis not present

## 2022-06-06 DIAGNOSIS — F251 Schizoaffective disorder, depressive type: Secondary | ICD-10-CM | POA: Diagnosis not present

## 2022-06-06 DIAGNOSIS — I10 Essential (primary) hypertension: Secondary | ICD-10-CM | POA: Diagnosis not present

## 2022-06-06 DIAGNOSIS — F333 Major depressive disorder, recurrent, severe with psychotic symptoms: Secondary | ICD-10-CM | POA: Diagnosis not present

## 2022-06-06 LAB — CK ISOENZYMES
CK-BB: 0 %
CK-MB: 0 % (ref 0–3)
CK-MM: 94 % — ABNORMAL LOW (ref 97–100)
Creatine Kinase-Total: 221 U/L — ABNORMAL HIGH (ref 32–182)
Macro Type 1: 6 % — ABNORMAL HIGH
Macro Type 2: 0 %

## 2022-06-06 MED ORDER — OLANZAPINE 10 MG PO TBDP
20.0000 mg | ORAL_TABLET | Freq: Every day | ORAL | Status: DC
  Administered 2022-06-06 – 2022-06-09 (×4): 20 mg via ORAL
  Filled 2022-06-06 (×6): qty 2

## 2022-06-06 MED ORDER — BENZTROPINE MESYLATE 0.5 MG PO TABS
0.5000 mg | ORAL_TABLET | Freq: Two times a day (BID) | ORAL | Status: DC
  Administered 2022-06-06 – 2022-06-09 (×6): 0.5 mg via ORAL
  Filled 2022-06-06 (×8): qty 1

## 2022-06-06 MED ORDER — BENZTROPINE MESYLATE 1 MG PO TABS: 1.0000 mg | ORAL_TABLET | Freq: Two times a day (BID) | ORAL | Status: DC

## 2022-06-06 MED ORDER — TRAZODONE HCL 100 MG PO TABS
100.0000 mg | ORAL_TABLET | Freq: Every day | ORAL | Status: DC
  Administered 2022-06-06 – 2022-06-09 (×4): 100 mg via ORAL
  Filled 2022-06-06 (×6): qty 1

## 2022-06-06 MED ORDER — CHLORPROMAZINE HCL 50 MG PO TABS
50.0000 mg | ORAL_TABLET | Freq: Two times a day (BID) | ORAL | Status: DC
  Administered 2022-06-06 – 2022-06-07 (×2): 50 mg via ORAL
  Filled 2022-06-06 (×6): qty 1

## 2022-06-06 MED ORDER — BENZTROPINE MESYLATE 0.5 MG PO TABS
0.5000 mg | ORAL_TABLET | Freq: Two times a day (BID) | ORAL | Status: DC
Start: 2022-06-06 — End: 2022-06-06
  Administered 2022-06-06: 0.5 mg via ORAL
  Filled 2022-06-06 (×5): qty 1

## 2022-06-06 MED ORDER — BENZTROPINE MESYLATE 1 MG/ML IJ SOLN
1.0000 mg | Freq: Two times a day (BID) | INTRAMUSCULAR | Status: DC | PRN
Start: 2022-06-06 — End: 2022-06-18

## 2022-06-06 MED ORDER — DOCUSATE SODIUM 100 MG PO CAPS: 100.0000 mg | ORAL_CAPSULE | Freq: Every day | ORAL | Status: DC | PRN
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
## 2022-06-06 NOTE — Progress Notes (Signed)
   06/06/22 2000  Psych Admission Type (Psych Patients Only)  Admission Status Voluntary  Psychosocial Assessment  Patient Complaints Depression;Isolation  Eye Contact Avertive  Facial Expression Fixed smile  Affect Preoccupied  Speech Soft  Interaction Minimal;Guarded  Motor Activity Slow  Appearance/Hygiene Unremarkable  Behavior Characteristics Cooperative  Mood Preoccupied  Thought Process  Coherency Circumstantial  Content WDL  Delusions None reported or observed  Perception WDL  Hallucination None reported or observed  Judgment Impaired  Confusion None  Danger to Self  Current suicidal ideation? Denies  Self-Injurious Behavior No self-injurious ideation or behavior indicators observed or expressed   Agreement Not to Harm Self Yes  Description of Agreement verbal  Danger to Others  Danger to Others None reported or observed

## 2022-06-06 NOTE — Progress Notes (Addendum)
Pt is A&OX4, calm, isolative at times (Pt sits on floor outside dayroom and watch television from floor; unsuccessful attempt to redirect), guarded, denies suicidal ideations, denies homicidal ideations, denies auditory hallucinations and denies visual hallucinations. Pt verbally agrees to approach staff if these become apparent and before harming self or others. Pt denies experiencing nightmares. Mood and affect are congruent. Pt appetite is ok. No complaints of anxiety, distress, pain and/or discomfort at this time. [name] memory appears to be grossly intact, and Pt hasn't displayed any injurious behaviors. Pt is medication compliant. There's no evidence of suicidal intent. Psychomotor activity was WNL. No s/s of Parkinson, Dystonia, Akathisia and/or Tardive Dyskinesia noted.

## 2022-06-06 NOTE — Progress Notes (Signed)
Patient ID: Cheryl Adams, female   DOB: [birth year], 31 y.o.   MRN: [id number] D: Patient states she had a good day. Patient presents with fixed smile and depressed mood. She prefers to sit on the floor outside of the day room, even when her peers are watching TV. She did attend group tonight. She took her nighttime medication without incident.  A: Continue to monitor medication management and MD orders.  Safety checks completed every 15 minutes per protocol.  Offer support and encouragement as needed.  R: Patient remains isolative and withdrawn.

## 2022-06-06 NOTE — Progress Notes (Signed)
The focus of this group is to help patients review their daily goal of treatment and discuss progress on daily workbooks.  Pt attended the evening group and responded to all discussion prompts from the Writer. Pt shared that today was a good day on the unit, the highlight of which was trying out meditation, which she liked. "I felt anxious before I tried it, but it went away."  Pt told that, upon feeling stressed at home, she liked to listen to music as a positive way to relax. "Usually 90s RnB, but just anything feel-good works."  Pt rated her day a 4 out of 10 and her affect was appropriate.

## 2022-06-06 NOTE — Group Note (Signed)
  [facility] LCSW Group Therapy Note  Date/Time:  06/06/2022 11:15am-12:00pm  Type of Therapy and Topic:  Group Therapy:  Self-Care after Hospitalization  Participation Level:  Did Not Attend   Description of Group This process group involved patients discussing how they plan to take care of themselves in a better manner when they get home from the hospital.  The group started with patients listing one healthy and one unhealthy way they took care of themselves prior to hospitalization.  A discussion ensued about the differences in healthy and unhealthy coping skills.  Group members shared ideas about making changes when they return home so that they can stay well and in recovery.  The white board was used to list ideas so that patients can continue to see these ideas throughout the day.  Therapeutic Goals Patient will identify and describe one healthy and one unhealthy coping technique used prior to hospitalization Patient will participate in generating ideas about healthy self-care options when they return to the community Patients will be supportive of one another and receive said support from others Patient will identify one healthy self-care activity to add to his/her post-hospitalization life that can help in recovery  Summary of Patient Progress:  The patient was invited to group, chose not to attend.   Therapeutic Modalities Brief Solution-Focused Therapy Motivational Interviewing Psychoeducation   Ambrose Mantle, LCSW 06/06/2022, 12:00pm

## 2022-06-06 NOTE — Progress Notes (Signed)
   06/06/22 0515  Sleep  Number of Hours 8

## 2022-06-06 NOTE — [facility] Group Notes (Signed)
Goals Group 78/2023   Group Focus: affirmation, clarity of thought, and goals/reality orientation Treatment Modality:  Psychoeducation Interventions utilized were assignment, group exercise, and support Purpose: To be able to understand and verbalize the reason for their admission to the hospital. To understand that the medication helps with their chemical imbalance but they also need to work on their choices in life. To be challenged to develop a list of 30 positives about themselves. Also introduce the concept that "feelings" are not reality.  Participation Level:  did not attend  Risa Auman A 

## 2022-06-07 DIAGNOSIS — F333 Major depressive disorder, recurrent, severe with psychotic symptoms: Secondary | ICD-10-CM | POA: Diagnosis not present

## 2022-06-07 LAB — CK TOTAL AND CKMB (NOT AT ARMC)
CK, MB: 2 ng/mL (ref 0.5–5.0)
Relative Index: 1.3 (ref 0.0–2.5)
Total CK: 158 U/L (ref 38–234)

## 2022-06-07 MED ORDER — CHLORPROMAZINE HCL 50 MG PO TABS
50.0000 mg | ORAL_TABLET | Freq: Every day | ORAL | Status: DC
Start: 2022-06-07 — End: 2022-06-09
  Administered 2022-06-07 – 2022-06-08 (×2): 50 mg via ORAL
  Filled 2022-06-07 (×4): qty 1

## 2022-06-07 MED ORDER — CHLORPROMAZINE HCL 25 MG PO TABS
25.0000 mg | ORAL_TABLET | Freq: Two times a day (BID) | ORAL | Status: DC
  Administered 2022-06-08 – 2022-06-09 (×4): 25 mg via ORAL
  Filled 2022-06-07 (×7): qty 1

## 2022-06-07 MED ORDER — WHITE PETROLATUM EX OINT
TOPICAL_OINTMENT | CUTANEOUS | Status: AC
  Administered 2022-06-07: 1
  Filled 2022-06-07: qty 5

## 2022-06-07 MED ORDER — BIOTENE DRY MOUTH MT LIQD
15.0000 mL | Freq: Every day | OROMUCOSAL | Status: DC | PRN
  Filled 2022-06-07: qty 15

## 2022-06-07 NOTE — Group Note (Signed)
[facility] LCSW Group Therapy Note  Date/Time:  06/07/2022 10:00am-11:00am  Type of Therapy and Topic:  Group Therapy:  Healthy and Unhealthy Supports  Participation Level:  Minimal   Description of Group:  Patients in this group were introduced to the idea of adding a variety of healthy supports to address the various needs in their lives, especially in reference to their plans and focus for the new year.  Patients discussed what additional healthy supports could be helpful in their recovery and wellness after discharge in order to prevent future hospitalizations such as counselor, doctor, other levels of psychiatric care such as ACTT services, therapy groups, 12-step groups, and problem-specific support groups.  A demonstration was given about how to set boundaries which patients expressed was beneficial.  Several songs were played to inspire patients to be more self-supportive.  Therapeutic Goals:   1)  discuss importance of adding supports to stay well once out of the hospital  2)  compare healthy versus unhealthy supports and identify some examples of each  3)  generate ideas and descriptions of healthy supports that can be added  4)  offer mutual support about how to address unhealthy supports  5)  encourage active participation in and adherence to discharge plan    Summary of Patient Progress:  The patient stated that current healthy supports in her life is music while unhealthy support is herself.  The patient expressed nothing else in group, as she left and did not return.   Therapeutic Modalities:   Motivational Interviewing Brief Solution-Focused Therapy  Ambrose Mantle, LCSW

## 2022-06-07 NOTE — Progress Notes (Signed)
Adult Psychoeducational Group Note  Date:  06/07/2022 Time:  9:05 PM  Group Topic/Focus:  Wrap-Up Group:   The focus of this group is to help patients review their daily goal of treatment and discuss progress on daily workbooks.  Participation Level:  Active  Participation Quality:  Appropriate  Affect:  Appropriate  Cognitive:  Appropriate  Insight: Appropriate  Engagement in Group:  Developing/Improving  Modes of Intervention:  Discussion  Additional Comments:   Pt stated her goal for today was to focus on her treatment plan. Pt stated she accomplished her goal today. Pt stated she did not talked with her doctor or with her social worker about her care today. Pt rated her overall day a 4 out of 10. Pt stated she really enjoyed looking outside at all the rain and wind this afternoon. Pt stated she made no calls today. Pt stated she felt better about herself tonight. Pt stated she attend all  meals today. Pt stated she took all medications provided today. Pt stated her appetite was fair today. Pt rated her sleep last night was fair. Pt stated the goal tonight was to get some rest. Pt stated she had no physical pain tonight. Pt deny visual hallucinations and auditory issues tonight. Pt denies thoughts of harming herself or others. Pt stated she would alert staff if anything changed.   Felipa Furnace 06/07/2022, 9:05 PM

## 2022-06-07 NOTE — [facility] Group Notes (Signed)
Adult Psychoeducational Group Note Date:  06/07/2022 Time:  0900-1045 Group Topic/Focus: PROGRESSIVE RELAXATION. A group where deep breathing is taught and tensing and relaxation muscle groups is used. Imagery is used as well.  Pts are asked to imagine 3 pillars that hold them up when they are not able to hold themselves up and to share that with the group.  Participation Level:  Active  Participation Quality:  Appropriate  Affect:  Appropriate  Cognitive:  Oriented  Insight: Improving  Engagement in Group:  Engaged  Modes of Intervention:  Activity, Discussion, Education, and Support  Additional Comments:  Pt rates her energy at a 4/10. Stayed for the exercise but then left the group.  Dione Housekeeper
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
## 2022-06-07 NOTE — Progress Notes (Signed)
Pt currently lying in bed with eyes closed, respirations even/unlabored, no s/s of distress (a) 15 min checks (r) safety maintained. 

## 2022-06-07 NOTE — Progress Notes (Signed)
Pt up at nursing station wanting something to help her sleep, appears anxious. Pt given vistaril, went to room to lay down, but came back close to nursing station and sat on floor outside of dayroom, support and encouragement given (a) 15 min checks (r) safety maintained.

## 2022-06-07 NOTE — Progress Notes (Signed)
   06/07/22 0529  Sleep  Number of Hours 6.5

## 2022-06-08 ENCOUNTER — Encounter (HOSPITAL_COMMUNITY): Payer: Self-pay

## 2022-06-08 DIAGNOSIS — F251 Schizoaffective disorder, depressive type: Secondary | ICD-10-CM | POA: Diagnosis not present

## 2022-06-08 LAB — PROLACTIN: Prolactin: 58.3 ng/mL — ABNORMAL HIGH (ref 4.8–23.3)

## 2022-06-08 MED ORDER — AMLODIPINE BESYLATE 10 MG PO TABS
10.0000 mg | ORAL_TABLET | Freq: Every day | ORAL | Status: DC
  Administered 2022-06-09 – 2022-09-10 (×94): 10 mg via ORAL
  Filled 2022-06-08 (×98): qty 1

## 2022-06-08 NOTE — Group Note (Signed)
Recreation Therapy Group Note   Group Topic:Coping Skills  Group Date: 06/08/2022 Start Time: 0955 End Time: 1030 Facilitators: Caroll Rancher, LRT,CTRS Location: 500 Hall Dayroom   Goal Area(s) Addresses:  Patient will identify positive coping skills. Patient will identify benefits of using coping skills post d/c.  Group Description:  Mind Map.  Patient was provided a blank template of a diagram with 32 blank boxes in a tiered system, branching from the center (similar to a bubble chart). LRT directed patients to label the middle of the diagram "Coping Skills".  LRT and patients then identified 8 different challenges (anxiety, depression, setting boundaries, relationships, attitude, self esteem, self respect and communication) in which coping skills could be used.  Patients were then given 20 minutes the come up with 3 coping skills for each challenge identified.  LRT would then write the coping skills the patients came up with on the board so patients could fill in any blank spots they may have had on their sheets.   Affect/Mood: Flat   Participation Level: Moderate   Participation Quality: Independent   Behavior: Distracted   Speech/Thought Process: Distracted   Insight: Poor   Judgement: Poor   Modes of Intervention: Worksheet   Patient Response to Interventions:  Receptive   Education Outcome:  Acknowledges education and In group clarification offered    Clinical Observations/Individualized Feedback: Pt was soft spoken and observant.  Pt seemed to be distracted by seeing the doctors in the hallway.  Pt did, however, identify meditation as coping skill she likes to use.  Pt was eventually called of group to meet with NP before returning.  Pt stayed for a few minutes more before leaving and not returning.    Plan: Continue to engage patient in RT group sessions 2-3x/week.   Caroll Rancher, LRT,CTRS  06/08/2022 1:02 PM

## 2022-06-08 NOTE — Progress Notes (Signed)
   06/08/22 0800  Psych Admission Type (Psych Patients Only)  Admission Status Voluntary  Psychosocial Assessment  Patient Complaints Isolation  Eye Contact Avertive;Brief;Poor  Facial Expression Anxious;Flat  Affect Depressed  Speech Soft;Slow  Interaction Avoidant;Childlike  Motor Activity Pacing;Slow  Appearance/Hygiene Unremarkable  Behavior Characteristics Cooperative  Mood Preoccupied  Thought Process  Coherency Circumstantial  Content Phobias  Delusions None reported or observed  Perception WDL  Hallucination None reported or observed  Judgment Impaired  Confusion None  Danger to Self  Current suicidal ideation? Denies  Self-Injurious Behavior No self-injurious ideation or behavior indicators observed or expressed   Agreement Not to Harm Self Yes  Danger to Others  Danger to Others None reported or observed

## 2022-06-08 NOTE — Group Note (Signed)
LCSW Group Therapy Note   Group Date: 06/08/2022 Start Time: 1300 End Time: 1400   Type of Therapy and Topic:  Group Therapy: Boundaries  Participation Level:  Minimal  Description of Group: This group will address the use of boundaries in their personal lives. Patients will explore why boundaries are important, the difference between healthy and unhealthy boundaries, and negative and postive outcomes of different boundaries and will look at how boundaries can be crossed.  Patients will be encouraged to identify current boundaries in their own lives and identify what kind of boundary is being set. Facilitators will guide patients in utilizing problem-solving interventions to address and correct types boundaries being used and to address when no boundary is being used. Understanding and applying boundaries will be explored and addressed for obtaining and maintaining a balanced life. Patients will be encouraged to explore ways to assertively make their boundaries and needs known to significant others in their lives, using other group members and facilitator for role play, support, and feedback.  Therapeutic Goals:  1.  Patient will identify areas in their life where setting clear boundaries could be  used to improve their life.  2.  Patient will identify signs/triggers that a boundary is not being respected. 3.  Patient will identify two ways to set boundaries in order to achieve balance in  their lives: 4.  Patient will demonstrate ability to communicate their needs and set boundaries  through discussion and/or role plays  Summary of Patient Progress:  Cheryl Adams was present/active during introductions and proved open to feedback from CSW and peers. Patient demonstrated fair insight into the subject matter.  Patient was minimal and walked out of group halfway through.   Therapeutic Modalities:   Cognitive Behavioral Therapy Solution-Focused Therapy  Beatris Si, LCSW 06/08/2022  2:29 PM

## 2022-06-08 NOTE — Progress Notes (Signed)
Adult Psychoeducational Group Note  Date:  06/08/2022 Time:  8:52 PM  Group Topic/Focus:  Wrap-Up Group:   The focus of this group is to help patients review their daily goal of treatment and discuss progress on daily workbooks.  Participation Level:  Active  Participation Quality:  Appropriate  Affect:  Appropriate  Cognitive:  Appropriate  Insight: Appropriate  Engagement in Group:  Developing/Improving  Modes of Intervention:  Discussion  Additional Comments:  Pt stated her goal for today was to focus on her treatment plan. Pt stated she accomplished her goal today. Pt stated she talk with her doctor and with her social worker about her care today. Pt rated her overall day a 4 out of 10. Pt stated she really enjoyed  going outside with her peers today. Pt stated she made no calls today. Pt stated she felt better about herself tonight. Pt stated she attend all meals today. Pt stated she took all medications provided today. Pt stated her appetite was fair today. Pt rated her sleep last night was fair. Pt stated the goal tonight was to get some rest. Pt stated she had no physical pain tonight. Pt deny visual hallucinations and auditory issues tonight. Pt denies thoughts of harming herself or others. Pt stated she would alert staff if anything changed.   Felipa Furnace 06/08/2022, 8:52 PM

## 2022-06-08 NOTE — Plan of Care (Signed)
?  Problem: Activity: ?Goal: Interest or engagement in activities will improve ?Outcome: Progressing ?Goal: Sleeping patterns will improve ?Outcome: Progressing ?  ?Problem: Coping: ?Goal: Ability to verbalize frustrations and anger appropriately will improve ?Outcome: Progressing ?Goal: Ability to demonstrate self-control will improve ?Outcome: Progressing ?  ?Problem: Safety: ?Goal: Periods of time without injury will increase ?Outcome: Progressing ?  ?

## 2022-06-08 NOTE — BH IP Treatment Plan (Signed)
Interdisciplinary Treatment and Diagnostic Plan Update  06/08/2022 Time of Session: update  Cheryl Adams MRN: [id number]  Principal Diagnosis: Schizoaffective disorder, depressive type [facility])  Secondary Diagnoses: Principal Problem:   Schizoaffective disorder, depressive type (HCC) Active Problems:   Insomnia   GAD (generalized anxiety disorder)   Vitamin D deficiency   Current Medications:  Current Facility-Administered Medications  Medication Dose Route Frequency Provider Last Rate Last Admin   acetaminophen (TYLENOL) tablet 500 mg  500 mg Oral Q6H PRN Massengill, Harrold Donath, MD       amLODipine (NORVASC) tablet 5 mg  5 mg Oral Daily Massengill, Nathan, MD   5 mg at 06/08/22 0759   antiseptic oral rinse (BIOTENE) solution 15 mL  15 mL Mouth Rinse 5 X Daily PRN Comer Locket, MD       benztropine (COGENTIN) tablet 0.5 mg  0.5 mg Oral BID Mason Jim, Amy E, MD   0.5 mg at 06/08/22 0801   benztropine mesylate (COGENTIN) injection 1 mg  1 mg Intramuscular BID PRN Comer Locket, MD       chlorproMAZINE (THORAZINE) tablet 25 mg  25 mg Oral BID Starleen Blue, NP   25 mg at 06/08/22 0759   chlorproMAZINE (THORAZINE) tablet 50 mg  50 mg Oral QHS Nkwenti, Doris, NP   50 mg at 06/07/22 2053   docusate sodium (COLACE) capsule 100 mg  100 mg Oral Daily PRN Bartholomew Crews E, MD       ferrous sulfate tablet 325 mg  325 mg Oral Daily Princess Bruins, DO   325 mg at 06/08/22 0801   hydrOXYzine (ATARAX) tablet 25 mg  25 mg Oral TID PRN Phineas Inches, MD   25 mg at 06/08/22 0402   LORazepam (ATIVAN) injection 1 mg  1 mg Intramuscular Q6H PRN Massengill, Harrold Donath, MD   1 mg at 05/28/22 1623   OLANZapine zydis (ZYPREXA) disintegrating tablet 5 mg  5 mg Oral Q8H PRN Massengill, Harrold Donath, MD   5 mg at 05/24/22 2154   And   LORazepam (ATIVAN) tablet 1 mg  1 mg Oral Q8H PRN Massengill, Harrold Donath, MD       And   ziprasidone (GEODON) injection 20 mg  20 mg Intramuscular Q8H PRN Massengill, Harrold Donath, MD        metFORMIN (GLUCOPHAGE-XR) 24 hr tablet 500 mg  500 mg Oral Q breakfast Massengill, Nathan, MD   500 mg at 06/08/22 0800   OLANZapine zydis (ZYPREXA) disintegrating tablet 20 mg  20 mg Oral QHS Nkwenti, Doris, NP   20 mg at 06/07/22 2054   sertraline (ZOLOFT) tablet 100 mg  100 mg Oral Daily Massengill, Harrold Donath, MD   100 mg at 06/08/22 0759   traZODone (DESYREL) tablet 100 mg  100 mg Oral QHS Nkwenti, Doris, NP   100 mg at 06/07/22 2054   Vitamin D (Ergocalciferol) (DRISDOL) capsule 50,000 Units  50,000 Units Oral Q7 days Starleen Blue, NP   50,000 Units at 06/01/22 0809   PTA Medications: Medications Prior to Admission  Medication Sig Dispense Refill Last Dose   ferrous sulfate 325 (65 FE) MG tablet Take 325 mg by mouth daily.      risperiDONE (RISPERDAL) 1 MG tablet Take 1 tablet (1 mg total) by mouth daily.      risperiDONE (RISPERDAL) 2 MG tablet Take 1 tablet (2 mg total) by mouth at bedtime.       Patient Stressors: Health problems   Marital or family conflict   Medication change or noncompliance  Patient Strengths: Average or above average intelligence  Supportive family/friends   Treatment Modalities: Medication Management, Group therapy, Case management,  1 to 1 session with clinician, Psychoeducation, Recreational therapy.   Physician Treatment Plan for Primary Diagnosis: Schizoaffective disorder, depressive type (HCC) Long Term Goal(s): Improvement in symptoms so as ready for discharge   Short Term Goals: Ability to verbalize feelings will improve Ability to disclose and discuss suicidal ideas Ability to identify and develop effective coping behaviors will improve Compliance with prescribed medications will improve  Medication Management: Evaluate patient's response, side effects, and tolerance of medication regimen.  Therapeutic Interventions: 1 to 1 sessions, Unit Group sessions and Medication administration.  Evaluation of Outcomes: Not Progressing  Physician  Treatment Plan for Secondary Diagnosis: Principal Problem:   Schizoaffective disorder, depressive type (HCC) Active Problems:   Insomnia   GAD (generalized anxiety disorder)   Vitamin D deficiency  Long Term Goal(s): Improvement in symptoms so as ready for discharge   Short Term Goals: Ability to verbalize feelings will improve Ability to disclose and discuss suicidal ideas Ability to identify and develop effective coping behaviors will improve Compliance with prescribed medications will improve     Medication Management: Evaluate patient's response, side effects, and tolerance of medication regimen.  Therapeutic Interventions: 1 to 1 sessions, Unit Group sessions and Medication administration.  Evaluation of Outcomes: Not Progressing   RN Treatment Plan for Primary Diagnosis: Schizoaffective disorder, depressive type (HCC) Long Term Goal(s): Knowledge of disease and therapeutic regimen to maintain health will improve  Short Term Goals: Ability to remain free from injury will improve, Ability to verbalize frustration and anger appropriately will improve, Ability to demonstrate self-control, Ability to participate in decision making will improve, Ability to verbalize feelings will improve, Ability to disclose and discuss suicidal ideas, Ability to identify and develop effective coping behaviors will improve, and Compliance with prescribed medications will improve  Medication Management: RN will administer medications as ordered by provider, will assess and evaluate patient's response and provide education to patient for prescribed medication. RN will report any adverse and/or side effects to prescribing provider.  Therapeutic Interventions: 1 on 1 counseling sessions, Psychoeducation, Medication administration, Evaluate responses to treatment, Monitor vital signs and CBGs as ordered, Perform/monitor CIWA, COWS, AIMS and Fall Risk screenings as ordered, Perform wound care treatments as  ordered.  Evaluation of Outcomes: Progressing   LCSW Treatment Plan for Primary Diagnosis: Schizoaffective disorder, depressive type (HCC) Long Term Goal(s): Safe transition to appropriate next level of care at discharge, Engage patient in therapeutic group addressing interpersonal concerns.  Short Term Goals: Engage patient in aftercare planning with referrals and resources, Increase social support, Increase ability to appropriately verbalize feelings, Increase emotional regulation, Facilitate acceptance of mental health diagnosis and concerns, Facilitate patient progression through stages of change regarding substance use diagnoses and concerns, Identify triggers associated with mental health/substance abuse issues, and Increase skills for wellness and recovery  Therapeutic Interventions: Assess for all discharge needs, 1 to 1 time with Social worker, Explore available resources and support systems, Assess for adequacy in community support network, Educate family and significant other(s) on suicide prevention, Complete Psychosocial Assessment, Interpersonal group therapy.  Evaluation of Outcomes: Progressing   Progress in Treatment: Attending groups: Yes. Participating in groups: As evidenced by:  patient participates in introductions but doesn't participate for the rest of the group. Taking medication as prescribed: Yes. Toleration medication: Yes. Family/Significant other contact made: Yes, individual(s) contacted:  Mother Patient understands diagnosis: Yes. Discussing patient identified problems/goals with  staff: Yes. Medical problems stabilized or resolved: Yes. Denies suicidal/homicidal ideation: Yes. Issues/concerns per patient self-inventory: No.  New problem(s) identified: No, Describe:  none reported   New Short Term/Long Term Goal(s):  medication stabilization, elimination of SI thoughts, development of comprehensive mental wellness plan.    Patient Goals:  No additional  goals identified at this time. Patient to continue to work towards original goals identified in initial treatment team meeting. CSW will remain available to patient should they voice additional treatment goals.   Discharge Plan or Barriers: No psychosocial barriers identified.  Patient will return back to residence when appropriate for discharge.  Patient set up with med management and therapy appointments at [facility] and [facility].  Patient continues to have paranoia around family that she lives with  Reason for Continuation of Hospitalization: Anxiety Delusions  Depression Medication stabilization  Estimated Length of Stay: 3-5 days  Last 3 [place] Suicide Severity Risk Score: Flowsheet Row [facility] ([facility]18/2022 in [facility] Office Visit from [facility]/14/2022 in [facility]  C-SSRS RISK CATEGORY No Risk High Risk No Risk       Last PHQ 2/9 Scores:    04/14/2022   10:46 AM 02/24/2022   11:12 AM 01/13/2022    9:14 AM  Depression screen PHQ 2/9  Decreased Interest 0 0 0  Down, Depressed, Hopeless 0 0 0  PHQ - 2 Score 0 0 0    Scribe for Treatment Team: Beatris Si, LCSW 06/08/2022 10:12 AM
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
## 2022-06-08 NOTE — Progress Notes (Signed)
    06/08/22 2042  Psych Admission Type (Psych Patients Only)  Admission Status Voluntary  Psychosocial Assessment  Patient Complaints Isolation  Eye Contact Avertive  Facial Expression Anxious;Fixed smile  Affect Anxious;Flat  Speech Soft;Slow  Interaction Avoidant;Cautious;Childlike;Guarded;Forwards little  Motor Activity Slow;Pacing  Appearance/Hygiene Unremarkable  Behavior Characteristics Cooperative;Anxious;Pacing  Mood Anxious;Preoccupied  Thought Process  Coherency Circumstantial  Content Preoccupation  Delusions None reported or observed  Perception WDL  Hallucination None reported or observed  Judgment Impaired  Confusion None  Danger to Self  Current suicidal ideation? Denies  Danger to Others  Danger to Others None reported or observed

## 2022-06-08 NOTE — Progress Notes (Signed)
Kash was isolative this evening. She would briefly sit in day room but chose to sit in floor away from peers.  She was minimal with conversation.  She denied SI/HI or AVH.  She did complain of anxiety and has been having difficulty sleeping.  She obtained hydroxyzine at 402am and is currently laying in her bed with her eyes open.  Q 15 minute checks maintained for safety.   06/07/22 2053  Psych Admission Type (Psych Patients Only)  Admission Status Voluntary  Psychosocial Assessment  Patient Complaints Isolation  Eye Contact Avertive  Facial Expression Fixed smile  Affect Preoccupied  Speech Slow;Soft  Interaction Avoidant;Childlike;Forwards little  Motor Activity Slow;Pacing  Appearance/Hygiene Unremarkable  Behavior Characteristics Cooperative  Mood Preoccupied;Pleasant  Thought Process  Coherency Circumstantial  Content WDL  Delusions None reported or observed  Perception WDL  Hallucination None reported or observed  Judgment Impaired  Confusion None  Danger to Self  Current suicidal ideation? Denies  Danger to Others  Danger to Others None reported or observed

## 2022-06-09 DIAGNOSIS — F333 Major depressive disorder, recurrent, severe with psychotic symptoms: Secondary | ICD-10-CM | POA: Diagnosis not present

## 2022-06-09 LAB — CBC WITH DIFFERENTIAL/PLATELET
Abs Immature Granulocytes: 0.02 10*3/uL (ref 0.00–0.07)
Basophils Absolute: 0 10*3/uL (ref 0.0–0.1)
Basophils Relative: 0 %
Eosinophils Absolute: 0.1 10*3/uL (ref 0.0–0.5)
Eosinophils Relative: 2 %
HCT: 44.4 % (ref 36.0–46.0)
Hemoglobin: 14 g/dL (ref 12.0–15.0)
Immature Granulocytes: 0 %
Lymphocytes Relative: 29 %
Lymphs Abs: 1.5 10*3/uL (ref 0.7–4.0)
MCH: 26.3 pg (ref 26.0–34.0)
MCHC: 31.5 g/dL (ref 30.0–36.0)
MCV: 83.5 fL (ref 80.0–100.0)
Monocytes Absolute: 0.5 10*3/uL (ref 0.1–1.0)
Monocytes Relative: 9 %
Neutro Abs: 3.2 10*3/uL (ref 1.7–7.7)
Neutrophils Relative %: 60 %
Platelets: 267 10*3/uL (ref 150–400)
RBC: 5.32 MIL/uL — ABNORMAL HIGH (ref 3.87–5.11)
RDW: 14.2 % (ref 11.5–15.5)
WBC: 5.4 10*3/uL (ref 4.0–10.5)
nRBC: 0 % (ref 0.0–0.2)

## 2022-06-09 LAB — TROPONIN I (HIGH SENSITIVITY): Troponin I (High Sensitivity): 7 ng/L (ref <18)

## 2022-06-09 MED ORDER — CLOZAPINE 25 MG PO TABS
25.0000 mg | ORAL_TABLET | Freq: Two times a day (BID) | ORAL | Status: AC
Start: 2022-06-11 — End: 2022-06-13
  Administered 2022-06-11 – 2022-06-13 (×4): 25 mg via ORAL
  Filled 2022-06-09 (×4): qty 1

## 2022-06-09 MED ORDER — CLOZAPINE 25 MG PO TABS
12.5000 mg | ORAL_TABLET | Freq: Two times a day (BID) | ORAL | Status: AC
  Administered 2022-06-09 – 2022-06-11 (×4): 12.5 mg via ORAL
  Filled 2022-06-09 (×6): qty 1

## 2022-06-09 MED ORDER — CLOZAPINE 25 MG PO TABS
50.0000 mg | ORAL_TABLET | Freq: Two times a day (BID) | ORAL | Status: AC
Start: 2022-06-15 — End: 2022-06-17
  Administered 2022-06-15 – 2022-06-17 (×4): 50 mg via ORAL
  Filled 2022-06-09 (×4): qty 2

## 2022-06-09 MED ORDER — CLOZAPINE 25 MG PO TABS
37.5000 mg | ORAL_TABLET | Freq: Two times a day (BID) | ORAL | Status: AC
  Administered 2022-06-13 – 2022-06-15 (×4): 37.5 mg via ORAL
  Filled 2022-06-09 (×4): qty 2

## 2022-06-09 MED ORDER — BENZTROPINE MESYLATE 0.5 MG PO TABS
0.5000 mg | ORAL_TABLET | Freq: Every day | ORAL | Status: DC
  Filled 2022-06-09 (×2): qty 1

## 2022-06-09 NOTE — Progress Notes (Signed)
   06/09/22 1000  Psych Admission Type (Psych Patients Only)  Admission Status Voluntary  Psychosocial Assessment  Patient Complaints Isolation  Eye Contact Avertive  Facial Expression Fixed smile  Affect Anxious;Flat  Speech Soft;Slow  Interaction Childlike  Motor Activity Slow;Pacing  Appearance/Hygiene Disheveled  Behavior Characteristics Cooperative  Mood Preoccupied  Thought Process  Coherency Circumstantial  Content Preoccupation  Delusions None reported or observed;WDL  Perception WDL  Hallucination None reported or observed  Judgment Impaired  Confusion None  Danger to Self  Current suicidal ideation? Denies  Danger to Others  Danger to Others None reported or observed

## 2022-06-09 NOTE — BHH Counselor (Signed)
CSW received FMLA paperwork to fill out for the Pt on 06/04/2022.  CSW has completed all of this paperwork but will need a discharge date to include with the paperwork and at this time there is no discharge date available. Today (06/09/2022) CSW received another copy of the same [facility] paperwork.  CSW has already completed one copy and will include the second copy in envelope back to the mother.  CSW contacted the [name] mother and explained that the paperwork is filled out but cannot be completed until there is a discharge date.  CSW updated the mother of the [name] progress.  CSW will continue to wait for a discharge date from providers before completing the [name] FMLA paperwork.

## 2022-06-09 NOTE — Progress Notes (Addendum)
Clozapine REMS pt ID: [id number] Registered on 06-09-2022  Phineas Inches, MD

## 2022-06-09 NOTE — Progress Notes (Signed)
   06/09/22 2020  Psych Admission Type (Psych Patients Only)  Admission Status Voluntary  Psychosocial Assessment  Patient Complaints None  Eye Contact Avertive  Facial Expression Anxious  Affect Anxious  Speech Soft;Slow  Interaction Childlike  Motor Activity Slow  Appearance/Hygiene Unremarkable  Behavior Characteristics Cooperative  Mood Preoccupied;Sad  Thought Process  Coherency Circumstantial  Content WDL  Delusions None reported or observed  Perception WDL  Hallucination None reported or observed  Judgment Limited  Confusion None  Danger to Self  Current suicidal ideation? Denies  Danger to Others  Danger to Others None reported or observed   Progress note   D: Pt seen in dayroom. Pt denies SI, HI, AVH. Pt rates pain  0/10. Pt rates anxiety  0/10 and depression  0/10. Pt states that her appetite is fair and is attending groups and sleeping well. Pt is on her menstrual cycle. Report from day shift is that pt was assisted with a shower because she was not tending to her menstrual cycle by wearing sanitary napkins. [name] clothing was washed. Pt was reminded tonight to change her menstrual pad before bed. Pt verbalized understanding and was given appropriate supplies. Pt says that the one good thing about her day was that she meditated to alleviate some anxiety. Pt has avertive gaze and soft speech.No other complaints noted at this time.  A: Pt provided support and encouragement. Pt given scheduled medication as prescribed. PRNs as appropriate. No evidence of cheeking noted. Q15 min checks for safety.   R: Pt safe on the unit. Will continue to monitor.
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
## 2022-06-09 NOTE — Group Note (Signed)
Recreation Therapy Group Note   Group Topic:Self-Esteem  Group Date: 06/09/2022 Start Time: 1000 End Time: 1030 Facilitators: Caroll Rancher, LRT,CTRS Location: 500 Hall Dayroom   Goal Area(s) Addresses:  Patient will be able to identify self esteem. Patient will successfully share why it is beneficial to positive self esteem.  Group Description:  Patients and LRT discussed the importance of self esteem and what influences how we see ourselves.  Patients then picked a picture of a blank face of their choosing.  Patients were instructed to draw and describe how they see themselves on the blank face.  On the back of the sheet, patients were to identify how other people perceive them.  Patients were given colored pencils, markers, and crayons to complete the assignment. Patients shared their completed assignment with each other. Writer and group reflected on how it's important to have positive thoughts about oneself because it can impact how you move in the world.   Affect/Mood: Appropriate   Participation Level: Active   Participation Quality: Independent   Behavior: Appropriate   Speech/Thought Process: Barely audible    Insight: Good   Judgement: Good   Modes of Intervention: Art   Patient Response to Interventions:  Engaged   Education Outcome:  Acknowledges education and In group clarification offered    Clinical Observations/Individualized Feedback: Pt was quiet but participated in the activity.  Pt expressed other people see her as "dark skinned with glasses".  Pt describes herself as resourceful and energetic.  Pt explained she keeps a positive attitude about herself through meditation and saying affirmations throughout the day.    Plan: Continue to engage patient in RT group sessions 2-3x/week.   Caroll Rancher, Antonietta Jewel 06/09/2022 12:35 PM

## 2022-06-10 DIAGNOSIS — F333 Major depressive disorder, recurrent, severe with psychotic symptoms: Secondary | ICD-10-CM | POA: Diagnosis not present

## 2022-06-10 MED ORDER — OLANZAPINE 10 MG PO TBDP
20.0000 mg | ORAL_TABLET | Freq: Every day | ORAL | Status: AC
Start: 2022-06-10 — End: 2022-06-10
  Administered 2022-06-10: 20 mg via ORAL
  Filled 2022-06-10: qty 2

## 2022-06-10 MED ORDER — OLANZAPINE 15 MG PO TBDP
15.0000 mg | ORAL_TABLET | Freq: Every day | ORAL | Status: AC
Start: 2022-06-11 — End: 2022-06-12
  Administered 2022-06-11 – 2022-06-12 (×2): 15 mg via ORAL
  Filled 2022-06-10 (×2): qty 1

## 2022-06-10 MED ORDER — TRAZODONE HCL 50 MG PO TABS
50.0000 mg | ORAL_TABLET | Freq: Every evening | ORAL | Status: DC | PRN
  Administered 2022-06-10 – 2022-06-16 (×5): 50 mg via ORAL
  Filled 2022-06-10 (×6): qty 1

## 2022-06-10 MED ORDER — OLANZAPINE 5 MG PO TBDP
5.0000 mg | ORAL_TABLET | Freq: Every day | ORAL | Status: AC
Start: 2022-06-15 — End: 2022-06-16
  Administered 2022-06-15 – 2022-06-16 (×2): 5 mg via ORAL
  Filled 2022-06-10 (×2): qty 1

## 2022-06-10 MED ORDER — ATENOLOL 25 MG PO TABS
25.0000 mg | ORAL_TABLET | Freq: Every day | ORAL | Status: DC
  Administered 2022-06-10 – 2022-08-28 (×80): 25 mg via ORAL
  Filled 2022-06-10 (×84): qty 1

## 2022-06-10 MED ORDER — OLANZAPINE 10 MG PO TBDP
10.0000 mg | ORAL_TABLET | Freq: Every day | ORAL | Status: AC
  Administered 2022-06-13 – 2022-06-14 (×2): 10 mg via ORAL
  Filled 2022-06-10 (×3): qty 1

## 2022-06-10 NOTE — Progress Notes (Signed)
Pt awake and at nurse's station c/o anxiety 4/10. Pt given PRNs as appropriate.

## 2022-06-10 NOTE — Group Note (Signed)
Recreation Therapy Group Note   Group Topic:Team Building  Group Date: 06/10/2022 Start Time: 1000 End Time: 1025 Facilitators: Caroll Rancher, LRT,CTRS Location: [address] Dayroom   Goal Area(s) Addresses:  Patient will effectively work with peer towards shared goal.  Patient will identify skills used to make activity successful.  Patient will identify how skills used during activity can be applied to reach post d/c goals.    Group Description: [employer]. In teams of 5-6, patients were given 20 small craft pipe cleaners. Using the materials provided, patients were instructed to compete again the opposing team(s) to build the tallest free-standing structure from floor level. The activity was timed; difficulty increased by Clinical research associate as Production designer, theatre/television/film continued.  Systematically resources were removed with additional directions for example, placing one arm behind their back, working in silence, and shape stipulations. LRT facilitated post-activity discussion reviewing team processes and necessary communication skills involved in completion. Patients were encouraged to reflect how the skills utilized, or not utilized, in this activity can be incorporated to positively impact support systems post discharge.   Affect/Mood: Drowsy   Participation Level: Moderate   Participation Quality: Independent   Behavior: Drowsy   Speech/Thought Process: Logical   Insight: Fair   Judgement: Fair    Modes of Intervention: Team-building   Patient Response to Interventions:  Attentive   Education Outcome:  Acknowledges education   Clinical Observations/Individualized Feedback: Pt attended group but appeared drowsy.  Pt seemed to be asleep during some parts of group and needed to be awakened.  Pt expressed the team used "special skills" to complete the activity.  Pt went on to say the skills from the group with her support system by "formulating a schedule to help with my daily  routine and keep me balanced".      Plan: Continue to engage patient in RT group sessions 2-3x/week.   Caroll Rancher, LRT,CTRS 06/10/2022 11:55 AM

## 2022-06-10 NOTE — Progress Notes (Signed)
   06/10/22 2100  Psych Admission Type (Psych Patients Only)  Admission Status Voluntary  Psychosocial Assessment  Patient Complaints Anxiety  Eye Contact Avertive  Facial Expression Anxious  Affect Appropriate to circumstance  Speech Soft;Slow  Interaction Minimal;Cautious  Motor Activity Slow  Appearance/Hygiene Improved  Behavior Characteristics Cooperative  Mood Preoccupied  Thought Process  Coherency Circumstantial  Content Preoccupation  Delusions None reported or observed  Perception WDL  Hallucination None reported or observed  Judgment Limited  Confusion None  Danger to Self  Current suicidal ideation? Denies  Danger to Others  Danger to Others None reported or observed   Progress note   D: Pt seen in hallway. Pt denies SI, HI, AVH. Pt rates pain  0/10. Pt rates anxiety 4/10 and depression  0/10. Pt continues to have avertive gaze and flat affect. Pt prompted to take care of her hygiene. Given feminine hygiene products. Pt says a good thing about her day is that she went to the gym with the group. Pt endorses fair appetite and has been attending groups. Sleep is fair. No other concerns noted.   A: Pt provided support and encouragement. Pt given scheduled medication as prescribed. PRNs as appropriate. Q15 min checks for safety.   R: Pt safe on the unit. Will continue to monitor.

## 2022-06-10 NOTE — BHH Group Notes (Signed)
Adult Psychoeducational Group Note  Date:  06/10/2022 Time:  9:04 AM  Group Topic/Focus:  Goals Group:   The focus of this group is to help patients establish daily goals to achieve during treatment and discuss how the patient can incorporate goal setting into their daily lives to aide in recovery.  Participation Level:  Minimal  Participation Quality:  Appropriate  Affect:  Appropriate  Cognitive:  Appropriate  Insight: Appropriate  Engagement in Group:  Engaged  Modes of Intervention:  Problem-solving and Rapport Building  Additional Comments:    Donell Beers 06/10/2022, 9:04 AM
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
## 2022-06-10 NOTE — Progress Notes (Signed)
Pt reported fair sleep with fair appetite "I was up but I went back to sleep". Denies SI, HI, AVH and pain when assessed. Visible in hallway at long intervals at the beginning of shift, standing against glass scanning the hall. Remains paranoid, suspicious but is compliant with medications and cooperative with unit routines. Affect is mostly flat with intermittent smiles, slow gait and avertive eye contact. Pt attended scheduled groups.  Support and reassurance provided to pt. Verbal education provided with scheduled medications and effects monitored. Safety checks maintained at Q 15 minutes intervals without outburst. Pt safe in milieu.

## 2022-06-10 NOTE — Progress Notes (Signed)
Adult Psychoeducational Group Note  Date:  06/10/2022 Time:  12:23 AM  Group Topic/Focus:  Wrap-Up Group:   The focus of this group is to help patients review their daily goal of treatment and discuss progress on daily workbooks.  Participation Level:  Active  Participation Quality:  Appropriate  Affect:  Appropriate  Cognitive:  Appropriate  Insight: Appropriate  Engagement in Group:  Developing/Improving  Modes of Intervention:  Discussion  Additional Comments:  Pt stated her goal for today was to focus on her treatment plan. Pt stated she accomplished her goal today. Pt stated she talk with her doctor and with her social worker about her care today. Pt rated her overall day a 4 out of 10. Pt stated she made no calls today. Pt stated she felt better about herself tonight. Pt stated she attend all meals today. Pt stated she took all medications provided today. Pt stated her appetite was good today. Pt rated her sleep last night was fair. Pt stated the goal tonight was to get some rest. Pt stated she had no physical pain tonight. Pt deny visual hallucinations and auditory issues tonight. Pt denies thoughts of harming herself or others. Pt stated she would alert staff if anything changed.   Felipa Furnace 06/10/2022, 12:23 AM

## 2022-06-10 NOTE — Group Note (Signed)
   [facility] LCSW Group Therapy Note  Date/Time:  06/10/2022 1:00pm  Type of Therapy and Topic:  Group Therapy:  Self-Care after Hospitalization  Participation Level:  Active   Description of Group This process group involved patients discussing how they plan to take care of themselves in a better manner when they get home from the hospital.  The group started with patients listing one healthy and one unhealthy way they took care of themselves prior to hospitalization.  A discussion ensued about the differences in healthy and unhealthy coping skills.  Group members shared ideas about making changes when they return home so that they can stay well and in recovery.  The white board was used to list ideas so that patients can continue to see these ideas throughout the day.  Therapeutic Goals Patient will identify and describe one healthy and one unhealthy coping technique used prior to hospitalization Patient will participate in generating ideas about healthy self-care options when they return to the community Patients will be supportive of one another and receive said support from others Patient will identify one healthy self-care activity to add to his/her post-hospitalization life that can help in recovery  Summary of Patient Progress:  The patient expressed that prior to hospitalization some healthy self-care activity being in nature she engaged in.  Patient's participation in group was active.   Patient identified seimming, go kart riding as another self-care activity to add in pursuit of recovery post-discharge.   Therapeutic Modalities Brief Solution-Focused Therapy Motivational Interviewing Psychoeducation      Marinda Elk, [place] 06/10/2022  2:27 PM

## 2022-06-10 NOTE — Progress Notes (Signed)
   06/10/22 0557  Sleep  Number of Hours 5.75

## 2022-06-11 ENCOUNTER — Encounter (HOSPITAL_COMMUNITY): Payer: Self-pay

## 2022-06-11 DIAGNOSIS — F333 Major depressive disorder, recurrent, severe with psychotic symptoms: Secondary | ICD-10-CM | POA: Diagnosis not present
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
## 2022-06-11 NOTE — Progress Notes (Signed)
   06/11/22 0542  Sleep  Number of Hours 7.5

## 2022-06-11 NOTE — [facility] Group Notes (Signed)
Adult Psychoeducational Group Note  Date:  06/11/2022 Time:  8:45 PM  Group Topic/Focus:  Wrap-Up Group:   The focus of this group is to help patients review their daily goal of treatment and discuss progress on daily workbooks.  Participation Level:  Minimal  Participation Quality:  Appropriate and Attentive  Affect:  Flat  Cognitive:  Alert  Insight: Lacking  Engagement in Group:  Engaged  Modes of Intervention:  Discussion and Education  Additional Comments:  Pt attended and participated in wrap up group this evening and rated their day a 4/10, due to them going to the gym, but feeling anxious around others. When asked if there was anything staff could do to reduce their anxiety, pt replied "I have to work through it myself". Pt states they have no information about their discharge.   Chrisandra Netters 06/11/2022, 8:45 PM

## 2022-06-11 NOTE — Group Note (Signed)
Recreation Therapy Group Note   Group Topic:Leisure Education  Group Date: 06/11/2022 Start Time: 1000 End Time: 1030 Facilitators: Caroll Rancher, LRT,CTRS Location: [address] Dayroom   Goal Area(s) Addresses:  Patient will identify positive leisure activities for use post discharge. Patient will identify at least one positive benefit of participation in leisure activities.  Patient will work effectively work with peer in completing the activity.  Group Description:  Patients were give one rubber disk a piece and one extra for the group.  Patients were to work as a team using the discs to get from one end of the room to the other and back.  Patients were to accomplish this by stepping on the disc, if anyone stepped off, the group had to start over.  Once the patients completed the task, they had to create a game of their own using the same rubber discs.  Patients need to come up with the rules, age limit, number of players and how the game is to be played.   Affect/Mood: Appropriate   Participation Level: Engaged   Participation Quality: Independent   Behavior: Appropriate   Speech/Thought Process: Barely audible    Insight: Good   Judgement: Good   Modes of Intervention: Cooperative Play   Patient Response to Interventions:  Engaged   Education Outcome:  Acknowledges education and In group clarification offered    Clinical Observations/Individualized Feedback: Pt was very soft spoken but would get louder when she became excited.  Pt was the first in line during the activity so she set the tone for how the group would maneuver during the activity.  Pt expressed the group used special skills such as making sure the distance was right for each person to step on.  During the creation of the groups game, took turns being the lead with another peer.  Pt shared one of her ideas as a person putting down the discs and another person guiding them on where to step.    Plan: Continue  to engage patient in RT group sessions 2-3x/week.   Caroll Rancher, Antonietta Jewel 06/11/2022 12:59 PM

## 2022-06-11 NOTE — [facility] Group Notes (Signed)
Adult Psychoeducational Group Note  Date:  06/11/2022 Time:  8:40 AM  Group Topic/Focus:  Goals Group:   The focus of this group is to help patients establish daily goals to achieve during treatment and discuss how the patient can incorporate goal setting into their daily lives to aide in recovery.  Participation Level:  Active  Participation Quality:  Appropriate  Affect:  Appropriate  Cognitive:  Appropriate  Insight: Appropriate  Engagement in Group:  Engaged  Modes of Intervention:  Discussion  Additional Comments:    Donell Beers 06/11/2022, 8:40 AM

## 2022-06-11 NOTE — Progress Notes (Signed)
Pt remains preoccupied, suspicious / hypervigilant and is minimal on interactions. Reports she slept well last night with fair appetite. Noted pacing hallway in milieu, scanning dinning room and fixated on window in dinning room. Per [facility] staff for both lunch and dinner pt refused meals "I'm not that hungry but stood at window in [employer] as if communicating with unseen objects outside window. Pt assisted with shower, change of clothes laundry by staff. Attended scheduled groups, went off unit with peers. Pt received PRN Vistaril 25 mg PO at 0828 for anxiety 4/10 "I haven't been drinking, I'm still here" and reported some relief when reassessed. Rated her depression and hopelessness both 4/10.  Q 15 minutes safety checks maintained without incident. All medications administered with verbal education and effects. Support, encouragement and reassurance provided to pt. Fluids encouraged and offered, tolerated well. Sandwich offered but pt declined. Will inform oncoming shift.

## 2022-06-11 NOTE — Progress Notes (Signed)
   06/11/22 1950  Psych Admission Type (Psych Patients Only)  Admission Status Voluntary  Psychosocial Assessment  Patient Complaints Anxiety  Eye Contact Avertive  Facial Expression Anxious  Affect Anxious  Speech Soft;Slow  Interaction Cautious;Minimal;Guarded  Motor Activity Slow  Appearance/Hygiene Improved  Behavior Characteristics Cooperative;Anxious  Mood Preoccupied  Thought Process  Coherency Circumstantial  Content Preoccupation  Delusions None reported or observed  Perception WDL  Hallucination None reported or observed  Judgment Limited  Confusion None  Danger to Self  Current suicidal ideation? Denies  Danger to Others  Danger to Others None reported or observed   Progress note   D: Pt seen in dayroom. Pt denies SI, HI, AVH. Pt rates pain  0/10. Pt rates anxiety  4/10 and depression  0/10. Pt has been attending groups and said her sleep was fair. Pt did not have much of an appetite today. Encouraged pt to drink fluids and eat a snack with the types of medications that she is prescribed. Witnessed pt eating chips in the dayroom. Says her favorite snack is cookies. Pt still has avertive gaze and needs reminders to attend to her hygiene. No other concerns noted.  A: Pt provided support and encouragement. Pt given scheduled medication as prescribed. PRNs as appropriate. Q15 min checks for safety.   R: Pt safe on the unit. Will continue to monitor.

## 2022-06-12 ENCOUNTER — Encounter (HOSPITAL_COMMUNITY): Payer: Self-pay

## 2022-06-12 DIAGNOSIS — F333 Major depressive disorder, recurrent, severe with psychotic symptoms: Secondary | ICD-10-CM | POA: Diagnosis not present
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
## 2022-06-12 NOTE — Group Note (Signed)
Recreation Therapy Group Note   Group Topic:Other  Group Date: 06/12/2022 Start Time: 1000 End Time: 1040 Facilitators: Caroll Rancher, LRT,CTRS Location: [address] Dayroom   Goal Area(s) Addresses:  Patient will successfully identify positive attributes about themselves.  Patient will identify healthy ways to increase self-expression Patient will acknowledge benefit(s) of expressing self in a positive manner.   Group Description:  TOTIKA.  This game consists of blocks of different colors (orange, blue, red and green).  It is also played just like [place]  Patients will pull a block from the tower and stack it on top of the tower.  Whatever color is picked, LRT will ask patient a question that corresponds with that particular color.  Patients will continue to play the game as instructed until the tower falls down.  Once the tower falls, it will be rebuilt and patients will play another round.   Affect/Mood: Drowsy   Participation Level: Active   Participation Quality: Independent   Behavior: Appropriate and Drowsy   Speech/Thought Process: Distracted   Insight: Moderate   Judgement: Moderate   Modes of Intervention: Open Conversation   Patient Response to Interventions:  Engaged   Education Outcome:  Acknowledges education and In group clarification offered    Clinical Observations/Individualized Feedback: Pt was appropriate but would easily dose off during session.  Pt was attentive and engaged with the activity when awake.  Pt shared that she gives the best advice to herself because she makes some good decisions.  Pt also shared, if she solve one of the world's problems it would be homelessness.      Plan: Continue to engage patient in RT group sessions 2-3x/week.   Caroll Rancher, LRT,CTRS 06/12/2022 1:05 PM

## 2022-06-12 NOTE — Group Note (Signed)
LCSW Group Therapy Note   Group Date: 06/12/2022 Start Time: 1100 End Time: 1200   Type of Therapy and Topic:  Group Therapy: Coping Skills  Participation Level:  Active  Due to the acuity and complex discharge plans, group was not held. Patient was provided therapeutic worksheets and asked to meet with CSW as needed.  Gearold Wainer M Demetrick Eichenberger, LCSWA 06/12/2022  2:01 PM    

## 2022-06-12 NOTE — Progress Notes (Signed)
DAR NOTE: Patient presents with a flat affect and depressed mood.  Food and fluid intake encouraged.  Patient unable to make her food choice whenever in the Cafeteria.  Patient just stands in line and stares at the food.  Peanut butter/Jelly sandwich was given per request.    Patient continues to need prompting with her ADL's.  Denies suicidal ideation, auditory and visual hallucinations.  Rates depression at 4, hopelessness at 4, and anxiety at 4.  Maintained on routine safety checks.  Medications given as prescribed.  Support and encouragement offered as needed.  Attended group and participated.  Patient visible in milieu watching TV with no interaction.  Routine safety checks maintained. Patient is safe on the unit.  Offered no complaint.

## 2022-06-12 NOTE — BH IP Treatment Plan (Signed)
Interdisciplinary Treatment and Diagnostic Plan Update  06/12/2022 Time of Session: 0830 Cheryl Adams MRN: [id number]  Principal Diagnosis: Schizoaffective disorder, depressive type [facility])  Secondary Diagnoses: Principal Problem:   Schizoaffective disorder, depressive type (HCC) Active Problems:   Insomnia   GAD (generalized anxiety disorder)   Vitamin D deficiency   Current Medications:  Current Facility-Administered Medications  Medication Dose Route Frequency Provider Last Rate Last Admin   acetaminophen (TYLENOL) tablet 500 mg  500 mg Oral Q6H PRN Massengill, Harrold Donath, MD       amLODipine (NORVASC) tablet 10 mg  10 mg Oral Daily Massengill, Harrold Donath, MD   10 mg at 06/12/22 3536   antiseptic oral rinse (BIOTENE) solution 15 mL  15 mL Mouth Rinse 5 X Daily PRN Comer Locket, MD       atenolol (TENORMIN) tablet 25 mg  25 mg Oral Daily Massengill, Harrold Donath, MD   25 mg at 06/12/22 1443   benztropine mesylate (COGENTIN) injection 1 mg  1 mg Intramuscular BID PRN Comer Locket, MD       cloZAPine (CLOZARIL) tablet 25 mg  25 mg Oral Q12H Massengill, Harrold Donath, MD   25 mg at 06/12/22 0813   Followed by   [name] ON 06/13/2022] cloZAPine (CLOZARIL) tablet 37.5 mg  37.5 mg Oral Q12H Massengill, Harrold Donath, MD       Followed by   [name] ON 06/15/2022] cloZAPine (CLOZARIL) tablet 50 mg  50 mg Oral Q12H Massengill, Nathan, MD       docusate sodium (COLACE) capsule 100 mg  100 mg Oral Daily PRN Mason Jim, Amy E, MD       ferrous sulfate tablet 325 mg  325 mg Oral Daily Princess Bruins, DO   325 mg at 06/12/22 0813   hydrOXYzine (ATARAX) tablet 25 mg  25 mg Oral TID PRN Phineas Inches, MD   25 mg at 06/11/22 2045   LORazepam (ATIVAN) injection 1 mg  1 mg Intramuscular Q6H PRN Massengill, Harrold Donath, MD   1 mg at 05/28/22 1623   OLANZapine zydis (ZYPREXA) disintegrating tablet 5 mg  5 mg Oral Q8H PRN Massengill, Harrold Donath, MD   5 mg at 05/24/22 2154   And   LORazepam (ATIVAN) tablet 1 mg  1 mg Oral Q8H PRN  Massengill, Harrold Donath, MD       And   ziprasidone (GEODON) injection 20 mg  20 mg Intramuscular Q8H PRN Massengill, Harrold Donath, MD       metFORMIN (GLUCOPHAGE-XR) 24 hr tablet 500 mg  500 mg Oral Q breakfast Massengill, Nathan, MD   500 mg at 06/12/22 1540   OLANZapine zydis (ZYPREXA) disintegrating tablet 15 mg  15 mg Oral QHS Massengill, Harrold Donath, MD   15 mg at 06/11/22 2045   Followed by   [name] ON 06/13/2022] OLANZapine zydis (ZYPREXA) disintegrating tablet 10 mg  10 mg Oral QHS Massengill, Harrold Donath, MD       Followed by   [name] ON 06/15/2022] OLANZapine zydis (ZYPREXA) disintegrating tablet 5 mg  5 mg Oral QHS Massengill, Nathan, MD       sertraline (ZOLOFT) tablet 100 mg  100 mg Oral Daily Massengill, Nathan, MD   100 mg at 06/12/22 0813   traZODone (DESYREL) tablet 50 mg  50 mg Oral QHS PRN Phineas Inches, MD   50 mg at 06/11/22 2045   Vitamin D (Ergocalciferol) (DRISDOL) capsule 50,000 Units  50,000 Units Oral Q7 days Starleen Blue, NP   50,000 Units at 06/08/22 1045   PTA Medications: Medications Prior  to Admission  Medication Sig Dispense Refill Last Dose   ferrous sulfate 325 (65 FE) MG tablet Take 325 mg by mouth daily.      risperiDONE (RISPERDAL) 1 MG tablet Take 1 tablet (1 mg total) by mouth daily.      risperiDONE (RISPERDAL) 2 MG tablet Take 1 tablet (2 mg total) by mouth at bedtime.       Patient Stressors: Health problems   Marital or family conflict   Medication change or noncompliance    Patient Strengths: Average or above average intelligence  Supportive family/friends   Treatment Modalities: Medication Management, Group therapy, Case management,  1 to 1 session with clinician, Psychoeducation, Recreational therapy.   Physician Treatment Plan for Primary Diagnosis: Schizoaffective disorder, depressive type (HCC) Long Term Goal(s): Improvement in symptoms so as ready for discharge   Short Term Goals: Ability to verbalize feelings will improve Ability to disclose  and discuss suicidal ideas Ability to identify and develop effective coping behaviors will improve Compliance with prescribed medications will improve  Medication Management: Evaluate patient's response, side effects, and tolerance of medication regimen.  Therapeutic Interventions: 1 to 1 sessions, Unit Group sessions and Medication administration.  Evaluation of Outcomes: Progressing  Physician Treatment Plan for Secondary Diagnosis: Principal Problem:   Schizoaffective disorder, depressive type (HCC) Active Problems:   Insomnia   GAD (generalized anxiety disorder)   Vitamin D deficiency  Long Term Goal(s): Improvement in symptoms so as ready for discharge   Short Term Goals: Ability to verbalize feelings will improve Ability to disclose and discuss suicidal ideas Ability to identify and develop effective coping behaviors will improve Compliance with prescribed medications will improve     Medication Management: Evaluate patient's response, side effects, and tolerance of medication regimen.  Therapeutic Interventions: 1 to 1 sessions, Unit Group sessions and Medication administration.  Evaluation of Outcomes: Progressing   RN Treatment Plan for Primary Diagnosis: Schizoaffective disorder, depressive type (HCC) Long Term Goal(s): Knowledge of disease and therapeutic regimen to maintain health will improve  Short Term Goals: Ability to remain free from injury will improve, Ability to verbalize frustration and anger appropriately will improve, Ability to demonstrate self-control, Ability to participate in decision making will improve, Ability to verbalize feelings will improve, Ability to disclose and discuss suicidal ideas, Ability to identify and develop effective coping behaviors will improve, and Compliance with prescribed medications will improve  Medication Management: RN will administer medications as ordered by provider, will assess and evaluate patient's response and  provide education to patient for prescribed medication. RN will report any adverse and/or side effects to prescribing provider.  Therapeutic Interventions: 1 on 1 counseling sessions, Psychoeducation, Medication administration, Evaluate responses to treatment, Monitor vital signs and CBGs as ordered, Perform/monitor CIWA, COWS, AIMS and Fall Risk screenings as ordered, Perform wound care treatments as ordered.  Evaluation of Outcomes: Progressing   LCSW Treatment Plan for Primary Diagnosis: Schizoaffective disorder, depressive type (HCC) Long Term Goal(s): Safe transition to appropriate next level of care at discharge, Engage patient in therapeutic group addressing interpersonal concerns.  Short Term Goals: Engage patient in aftercare planning with referrals and resources, Increase social support, Increase ability to appropriately verbalize feelings, Increase emotional regulation, Facilitate acceptance of mental health diagnosis and concerns, Facilitate patient progression through stages of change regarding substance use diagnoses and concerns, Identify triggers associated with mental health/substance abuse issues, and Increase skills for wellness and recovery  Therapeutic Interventions: Assess for all discharge needs, 1 to 1 time with Child psychotherapist,  Explore available resources and support systems, Assess for adequacy in community support network, Educate family and significant other(s) on suicide prevention, Complete Psychosocial Assessment, Interpersonal group therapy.  Evaluation of Outcomes: Progressing   Progress in Treatment: Attending groups: Yes. Participating in groups: Yes. Taking medication as prescribed: Yes. Toleration medication: Yes. Family/Significant other contact made: Yes, individual(s) contacted:  SPE completed with Sherlean Foot, mother.  Patient understands diagnosis: Yes. Discussing patient identified problems/goals with staff: Yes. Medical problems stabilized or  resolved: Yes. Denies suicidal/homicidal ideation: Yes. Issues/concerns per patient self-inventory: Yes. Other: none  New problem(s) identified: No, Describe:  none  New Short Term/Long Term Goal(s): Patient to work towards detox, elimination of symptoms of psychosis, medication management for mood stabilization; elimination of SI thoughts; development of comprehensive mental wellness/sobriety plan.  Patient Goals:  No additional goals identified at this time. Patient to continue to work towards original goals identified in initial treatment team meeting. CSW will remain available to patient should they voice additional treatment goals.   Discharge Plan or Barriers: No psychosocial barriers identified at this time, patient to return to place of residence when appropriate for discharge.   Reason for Continuation of Hospitalization: Other; describe Psychosis   Estimated Length of Stay: 1-7 days    Scribe for Treatment Team: Almedia Balls 06/12/2022 12:35 PM

## 2022-06-12 NOTE — Progress Notes (Signed)
   06/12/22 0531  Sleep  Number of Hours 7.75

## 2022-06-12 NOTE — Progress Notes (Signed)
   06/12/22 1945  Psych Admission Type (Psych Patients Only)  Admission Status Voluntary  Psychosocial Assessment  Patient Complaints Anxiety  Eye Contact Brief  Facial Expression Anxious  Affect Anxious  Speech Soft  Interaction Cautious;Guarded  Motor Activity Slow  Appearance/Hygiene Disheveled  Behavior Characteristics Cooperative  Mood Preoccupied  Aggressive Behavior  Effect No apparent injury  Thought Process  Coherency Circumstantial  Content Preoccupation  Delusions WDL  Perception WDL  Hallucination None reported or observed  Judgment Limited  Confusion None  Danger to Self  Current suicidal ideation? Denies  Danger to Others  Danger to Others None reported or observed

## 2022-06-12 NOTE — Progress Notes (Signed)
Pt was encouraged but didn't attend orientation/goals group. ?

## 2022-06-12 NOTE — [facility] Group Notes (Signed)
Wrap up group   Pt attended and contributed to group. 

## 2022-06-12 NOTE — [facility] Counselor (Signed)
CSW attempted to speak with the Pt about signing her FMLA paperwork.  The Pt refused to sign it stating "I am not worried about it right now" and "No I don't trust signing it".  CSW explained to the Pt that this was for her job and FMLA and the Pt continued to refuse.  CSW will hold on to the paperwork and attempt again when the Pt is less paranoid.

## 2022-06-13 DIAGNOSIS — F251 Schizoaffective disorder, depressive type: Secondary | ICD-10-CM | POA: Diagnosis not present

## 2022-06-13 MED ORDER — WHITE PETROLATUM EX OINT
TOPICAL_OINTMENT | CUTANEOUS | Status: AC
  Administered 2022-06-13: 1
  Filled 2022-06-13: qty 5

## 2022-06-13 NOTE — Progress Notes (Signed)
   06/13/22 0615  Sleep  Number of Hours 5.75

## 2022-06-13 NOTE — [facility] Group Notes (Signed)
The focus of this group is to help patients establish daily goals to achieve during treatment and discuss how the patient can incorporate goal setting into their daily lives to aide in recovery.  Patient attended goals group. She shared that she did not have a goal, despite encouragement and options given by this Clinical research associate. She was seen falling asleep in the dayroom. She rated her depression and anxiety a 4 out of 10, with 10 being the highest.
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
## 2022-06-13 NOTE — Progress Notes (Signed)
Pt is A&OX4, calm, flat, isolative, bizarre, paranoid, denies suicidal ideations, denies homicidal ideations, denies auditory hallucinations and denies visual hallucinations. Pt verbally agrees to approach staff if these become apparent and before harming self or others. Pt denies experiencing nightmares. Mood and affect are congruent. Pt reports her appetite is "fair," however, Pt only ate a slice of cake for lunch. No complaints of anxiety, distress, pain and/or discomfort at this time. [name] memory appears to be grossly intact, and Pt hasn't displayed any injurious behaviors. Pt is medication compliant. There's no evidence of suicidal intent. Psychomotor activity was WNL. No s/s of Parkinson, Dystonia, Akathisia and/or Tardive Dyskinesia noted.  Pt reports having "good" concentration today, and her energy level is "normal." Pt reports her depression and anxiety levels are "4/10."

## 2022-06-13 NOTE — Group Note (Signed)
LCSW Group Therapy Note   06/13/2022 4:19 PM  Type of Therapy and Topic:  Group Therapy:   Emotions and Triggers    Participation Level:  Minimal  Description of Group: Participants were asked to participate in an assignment that involved exploring more about oneself. Patients were asked to identify things that typically triggered an emotional response and think about the physical symptoms they experience when feeling this way. [name] were encouraged to identify the thoughts that they have when feeling this way and discuss ways to cope with it.  The focus was on the emotion of "anger" during this group.  Patients found they could relate to each other's experiences.  Therapeutic Goals:   Patient will state the definition of an emotion and identify two pleasant and two unpleasant emotions they have experienced. Patient will describe the relationship between thoughts, emotions and triggers.  Patient will state the definition of a trigger and identify three triggers experienced recently   Summary of Patient Progress:   The patient stated she gets angry when people say things about other people.  She seemed to be quite sedated, had her eyes closed for much of group and did not participate further.     Therapeutic Modalities: Cognitive Behavioral Therapy Motivational Interviewing

## 2022-06-13 NOTE — [facility] Group Notes (Signed)
Wrap up group   Pt attended and contributed to group. 

## 2022-06-13 NOTE — Progress Notes (Signed)
Patient observed up in the dayroom staring at the floor for the majority of the time in the dayroom instead of watching tv. She reported to Clinical research associate that she has had a good day. She had to be awakened for group. Support given and safety maintained with 15 min checks.

## 2022-06-14 DIAGNOSIS — F251 Schizoaffective disorder, depressive type: Secondary | ICD-10-CM | POA: Diagnosis not present

## 2022-06-14 NOTE — Progress Notes (Signed)
Adult Psychoeducational Group Note  Date:  06/14/2022 Time:  9:19 PM  Group Topic/Focus:  Wrap-Up Group:   The focus of this group is to help patients review their daily goal of treatment and discuss progress on daily workbooks.  Participation Level:  Active  Participation Quality:  Appropriate  Affect:  Appropriate  Cognitive:  Appropriate  Insight: Appropriate  Engagement in Group:  Engaged  Modes of Intervention:  Discussion  Additional Comments:   Pt rated her day 4/10. "It was regular". Pt reported no goal was set today.  Pt actively engaged in Wrap-Up group. Pt identified going outside as a coping skill and activity  to improve her mood and day.  Edmund Hilda Linder Prajapati 06/14/2022, 9:19 PM

## 2022-06-14 NOTE — [facility] Group Notes (Signed)
[facility] Group Notes:  (Nursing/MHT/Case Management/Adjunct)  Date:  06/14/2022  Time:  4:32 PM  Type of Therapy:  Group Therapy  Summary of Progress/Problems:  Patient participated in a goals/ orientation group today. Patient did not have a goal that she wanted to work on today.   Daneil Dan 06/14/2022, 4:32 PM

## 2022-06-14 NOTE — Progress Notes (Signed)
   06/14/22 2011  Psych Admission Type (Psych Patients Only)  Admission Status Voluntary  Psychosocial Assessment  Eye Contact Poor  Facial Expression Flat  Affect Blunted  Speech Soft  Interaction Minimal  Motor Activity Slow  Appearance/Hygiene Disheveled  Behavior Characteristics Guarded  Mood Preoccupied  Thought Process  Coherency Unable to assess  Content Preoccupation  Delusions None reported or observed  Perception WDL  Hallucination None reported or observed  Judgment Poor  Confusion None  Danger to Self  Current suicidal ideation? Denies
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
## 2022-06-14 NOTE — Progress Notes (Signed)
Patient continues to be calm but she is paranoid. She continues to endorse worry about her mother and sister being after her. Pt denies any SI/HI/AVH but does not want her mother/sister to be contacted regarding her stay here. Pt plans to go to a shelter upon discharge. Pt is medication compliant but continues to appear paranoid regarding her medications frequently asking questions. No NSSI behavior noted. Denies any pain at present. Will continue q46min checks and provide verbal encouragement.

## 2022-06-14 NOTE — Group Note (Signed)
[facility] LCSW Group Therapy Note  Date/Time:  06/14/2022  11:00AM-12:00PM  Type of Therapy and Topic:  Group Therapy:  Music and Mood  Participation Level:  Active   Description of Group: In this process group, members listened to a variety of genres of music and identified that different types of music evoke different responses.  Patients were encouraged to identify music that was soothing for them and music that was energizing for them.  Patients discussed how this knowledge can help with wellness and recovery in various ways including managing depression and anxiety as well as encouraging healthy sleep habits.    Therapeutic Goals: Patients will explore the impact of different varieties of music on mood Patients will verbalize the thoughts they have when listening to different types of music Patients will identify music that is soothing to them as well as music that is energizing to them Patients will discuss how to use this knowledge to assist in maintaining wellness and recovery Patients will explore the use of music as a coping skill  Summary of Patient Progress:  At the beginning of group, patient expressed her mood was a little anxious and tired.  At the end of group, patient expressed her mood was a little better, more hopeful.    Therapeutic Modalities: Solution Focused Brief Therapy Activity   Ambrose Mantle, LCSW

## 2022-06-14 NOTE — Plan of Care (Signed)
  Problem: Education: Goal: Knowledge of [facility] Education information/materials will improve Outcome: Progressing Goal: Emotional status will improve Outcome: Progressing Goal: Mental status will improve Outcome: Progressing Goal: Verbalization of understanding the information provided will improve Outcome: Progressing   Problem: Activity: Goal: Interest or engagement in activities will improve Outcome: Progressing Goal: Sleeping patterns will improve Outcome: Progressing   Problem: Coping: Goal: Ability to verbalize frustrations and anger appropriately will improve Outcome: Progressing Goal: Ability to demonstrate self-control will improve Outcome: Progressing   Problem: Health Behavior/Discharge Planning: Goal: Identification of resources available to assist in meeting health care needs will improve Outcome: Progressing Goal: Compliance with treatment plan for underlying cause of condition will improve Outcome: Progressing   Problem: Physical Regulation: Goal: Ability to maintain clinical measurements within normal limits will improve Outcome: Progressing   Problem: Safety: Goal: Periods of time without injury will increase Outcome: Progressing   Problem: Activity: Goal: Interest or engagement in leisure activities will improve Outcome: Progressing Goal: Imbalance in normal sleep/wake cycle will improve Outcome: Progressing   Problem: Coping: Goal: Coping ability will improve Outcome: Progressing Goal: Will verbalize feelings Outcome: Progressing   Problem: Coping: Goal: Ability to identify and develop effective coping behavior will improve Outcome: Progressing   Problem: Education: Goal: Will be free of psychotic symptoms Outcome: Progressing Goal: Knowledge of the prescribed therapeutic regimen will improve Outcome: Progressing   Problem: Coping: Goal: Coping ability will improve Outcome: Progressing Goal: Will verbalize feelings Outcome:  Progressing   Problem: Health Behavior/Discharge Planning: Goal: Compliance with prescribed medication regimen will improve Outcome: Progressing   Problem: Nutritional: Goal: Ability to achieve adequate nutritional intake will improve Outcome: Progressing   Problem: Role Relationship: Goal: Ability to communicate needs accurately will improve Outcome: Progressing Goal: Ability to interact with others will improve Outcome: Progressing

## 2022-06-15 DIAGNOSIS — F333 Major depressive disorder, recurrent, severe with psychotic symptoms: Secondary | ICD-10-CM | POA: Diagnosis not present

## 2022-06-15 NOTE — Progress Notes (Signed)
Pt denies SI/HI/AVH and verbally agrees to approach staff if these become apparent or before harming themselves/others. Rates depression 0/10. Rates anxiety 0/10. Rates pain 0/10.  Pt is confused and disorganized. Pt would go to get her pitcher and then came back saying she needed another one but there was a pitcher in her room she could have grabbed. Pt stated she was in the shower and the floor was wet but pt still had body odor. Pt room is a mess. Pt put gown on backwards and walked out with no underwear on. Pt would not wash hair. Clothes are being washed. Pt has been in the dayroom for the majority of the day. Scheduled medications administered to pt, per MD orders. RN provided support and encouragement to pt. Q15 min safety checks implemented and continued. Pt safe on the unit. RN will continue to monitor and intervene as needed.   06/15/22 0816  Psych Admission Type (Psych Patients Only)  Admission Status Voluntary  Psychosocial Assessment  Patient Complaints None  Eye Contact Poor;Avoids  Facial Expression Fixed smile  Affect Blunted;Flat  Speech Soft  Interaction Guarded;Forwards little;Minimal;Childlike  Motor Activity Slow  Appearance/Hygiene Disheveled  Behavior Characteristics Cooperative;Guarded  Mood Pleasant  Thought Process  Coherency Disorganized  Content WDL  Delusions None reported or observed  Perception WDL  Hallucination None reported or observed  Judgment Poor  Confusion Mild  Danger to Self  Current suicidal ideation? Denies  Danger to Others  Danger to Others None reported or observed

## 2022-06-15 NOTE — Group Note (Signed)
LCSW Group Therapy Note  Group Date: 06/15/2022 Start Time: 1300 End Time: 1400   Type of Therapy and Topic:  Group Therapy - How To Cope with Nervousness about Discharge   Participation Level:  Minimal   Description of Group This process group involved identification of patients' feelings about discharge. Some of them are scheduled to be discharged soon, while others are new admissions, but each of them was asked to share thoughts and feelings surrounding discharge from the hospital. One common theme was that they are excited at the prospect of going home, while another was that many of them are apprehensive about sharing why they were hospitalized. Patients were given the opportunity to discuss these feelings with their peers in preparation for discharge.  Therapeutic Goals  Patient will identify their overall feelings about pending discharge. Patient will think about how they might proactively address issues that they believe will once again arise once they get home (i.e. with parents). Patients will participate in discussion about having hope for change.   Summary of Patient Progress:   Patient was present for the entirety of group session. Patient participated in opening and closing remarks. However, patient did not contribute at all to the topic of discussion despite encouraged participation.   Therapeutic Modalities Cognitive Behavioral Therapy   Jarmar Rousseau W Dayle Mcnerney, LCSWA 06/15/2022  4:21 PM   

## 2022-06-15 NOTE — Progress Notes (Signed)
   06/15/22 2030  Psych Admission Type (Psych Patients Only)  Admission Status Voluntary  Psychosocial Assessment  Patient Complaints None  Eye Contact Avoids  Facial Expression Blank  Affect Blunted  Speech Soft  Interaction Cautious;Guarded  Motor Activity Slow  Appearance/Hygiene In hospital gown  Behavior Characteristics Cooperative  Mood Pleasant  Aggressive Behavior  Effect No apparent injury  Thought Process  Coherency Disorganized  Content WDL  Delusions None reported or observed  Perception WDL  Hallucination None reported or observed  Judgment Poor  Confusion Mild  Danger to Self  Current suicidal ideation? Denies

## 2022-06-15 NOTE — Progress Notes (Signed)
Adult Psychoeducational Group Note  Date:  06/15/2022 Time:  8:24 PM  Group Topic/Focus:  Wrap-Up Group:   The focus of this group is to help patients review their daily goal of treatment and discuss progress on daily workbooks.  Participation Level:  Active  Participation Quality:  Appropriate  Affect:  Appropriate  Cognitive:  Appropriate  Insight: Appropriate  Engagement in Group:  Developing/Improving  Modes of Intervention:  Discussion  Additional Comments:  Pt stated her goal for today was to focus on her treatment plan. Pt stated she accomplished her goal today. Pt stated she talked with her doctor and with her social worker about her care today. Pt rated her overall day a 4 out of 10. Pt stated she made no calls today. Pt stated she felt better about herself tonight. Pt stated she was able to attend all meals today. Pt stated she took all medications provided today. Pt stated her appetite was pretty good today. Pt rated her sleep last night was fair. Pt stated the goal tonight was to get some rest. Pt stated she had no physical pain tonight. Pt deny visual hallucinations and auditory issues tonight. Pt denies thoughts of harming herself or others. Pt stated she would alert staff if anything changed.  Felipa Furnace 06/15/2022, 8:24 PM

## 2022-06-15 NOTE — Progress Notes (Signed)
Pt was encouraged but didn't attend orientation/goals group. ?

## 2022-06-15 NOTE — Group Note (Signed)
Recreation Therapy Group Note   Group Topic:Coping Skills  Group Date: 06/15/2022 Start Time: 1005 End Time: 1100 Facilitators: Caroll Rancher, LRT,CTRS Location: 500 Hall Dayroom   Goal Area(s) Addresses: Patient will define what a coping skill is. Patient will successfully identify positive coping skills they can use post d/c.  Patient will acknowledge benefit(s) of using learned coping skills post d/c.  Group Description:  Coping A to Z. Patient asked to identify what a coping skill is and when they use them. Patients with Clinical research associate discussed healthy versus unhealthy coping skills. Next patients were given a blank worksheet titled "Coping Skills A-Z". Patients were instructed to come up with at least one positive coping skill per letter of the alphabet, addressing a specific challenge (ex: stress, anger, anxiety, depression, grief, doubt, isolation, self-harm/suicidal thoughts, substance use). Patients were given 20 minutes to brainstorm before ideas were presented to the large group. Patients and LRT debriefed on the importance of coping skill selection based on situation and back-up plans when a skill tried is not effective.    Affect/Mood: Appropriate   Participation Level: Active   Participation Quality: Independent   Behavior: Appropriate   Speech/Thought Process: Focused   Insight: Good   Judgement: Good   Modes of Intervention: Worksheet   Patient Response to Interventions:  Engaged   Education Outcome:  Acknowledges education and In group clarification offered    Clinical Observations/Individualized Feedback: Pt was appropriate and engaged in group session.  Pt expressed coping skills "help with stress".  Pt had to identify coping skills for doubt.  Pt coping skills were centered mostly around activities.  Some of the coping skills pt identified were as art, bowling, cooking, dancing, fishing, golf, music, swimming, yoga, meditation and jogging.  Pt still had moments  were she appeared she was dosing off.    Plan: Continue to engage patient in RT group sessions 2-3x/week.   Caroll Rancher, LRT,CTRS 06/15/2022 1:13 PM
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
## 2022-06-16 ENCOUNTER — Encounter (HOSPITAL_COMMUNITY): Payer: 59 | Admitting: Physician Assistant

## 2022-06-16 DIAGNOSIS — F333 Major depressive disorder, recurrent, severe with psychotic symptoms: Secondary | ICD-10-CM | POA: Diagnosis not present

## 2022-06-16 LAB — CBC WITH DIFFERENTIAL/PLATELET
Abs Immature Granulocytes: 0.02 10*3/uL (ref 0.00–0.07)
Basophils Absolute: 0 10*3/uL (ref 0.0–0.1)
Basophils Relative: 0 %
Eosinophils Absolute: 0.1 10*3/uL (ref 0.0–0.5)
Eosinophils Relative: 2 %
HCT: 44.2 % (ref 36.0–46.0)
Hemoglobin: 14.1 g/dL (ref 12.0–15.0)
Immature Granulocytes: 0 %
Lymphocytes Relative: 26 %
Lymphs Abs: 2.1 10*3/uL (ref 0.7–4.0)
MCH: 26.5 pg (ref 26.0–34.0)
MCHC: 31.9 g/dL (ref 30.0–36.0)
MCV: 83.1 fL (ref 80.0–100.0)
Monocytes Absolute: 0.6 10*3/uL (ref 0.1–1.0)
Monocytes Relative: 7 %
Neutro Abs: 5.1 10*3/uL (ref 1.7–7.7)
Neutrophils Relative %: 65 %
Platelets: 320 10*3/uL (ref 150–400)
RBC: 5.32 MIL/uL — ABNORMAL HIGH (ref 3.87–5.11)
RDW: 13.9 % (ref 11.5–15.5)
WBC: 7.9 10*3/uL (ref 4.0–10.5)
nRBC: 0 % (ref 0.0–0.2)

## 2022-06-16 LAB — TROPONIN I (HIGH SENSITIVITY): Troponin I (High Sensitivity): 5 ng/L (ref <18)

## 2022-06-16 MED ORDER — CLOZAPINE 25 MG PO TABS
75.0000 mg | ORAL_TABLET | Freq: Two times a day (BID) | ORAL | Status: DC
Start: 2022-06-19 — End: 2022-06-17

## 2022-06-16 MED ORDER — CLOZAPINE 25 MG PO TABS
62.5000 mg | ORAL_TABLET | Freq: Two times a day (BID) | ORAL | Status: DC
  Filled 2022-06-16 (×2): qty 3

## 2022-06-16 NOTE — Progress Notes (Signed)
Pt Denies SI, HI, AVH and pain when assessed. Eye contact remains avertive with slow movement. Noted with increased paranoia this shift. Stayed in dayroom majority of this shift asleep, difficult to arouse, drooling as well. Declined to go into her room except to change her clothes despite multiple prompts "I don't want to. I don't feel safe in there". Declined to shower as well. Pt refused to eat lunch and dinner "I wasn't hungry". Fluids given. Support, encouragement and reassurance provided to pt this shift. Verbal education provided on all medications and effects monitored. Safety checks maintained at Q 15 intervals. Pt remains safe on and off unit.

## 2022-06-16 NOTE — Progress Notes (Signed)
Pt was encouraged but didn't attend orientation/goals group. ?
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
## 2022-06-16 NOTE — Progress Notes (Signed)
Pt stated she had a good day and was feeling a little better    06/16/22 2145  Psych Admission Type (Psych Patients Only)  Admission Status Voluntary  Psychosocial Assessment  Patient Complaints Confusion  Eye Contact Avoids  Facial Expression Blank  Affect Blunted  Speech Soft  Interaction Cautious;Guarded  Motor Activity Slow  Appearance/Hygiene In hospital gown  Behavior Characteristics Cooperative  Mood Anxious  Aggressive Behavior  Effect No apparent injury  Thought Process  Coherency Disorganized  Content WDL  Delusions None reported or observed  Perception WDL  Hallucination None reported or observed  Judgment Poor  Confusion Mild  Danger to Self  Current suicidal ideation? Denies

## 2022-06-16 NOTE — Group Note (Signed)
Recreation Therapy Group Note   Group Topic:Health and Wellness  Group Date: 06/16/2022 Start Time: 1000 End Time: 1040 Facilitators: Caroll Rancher, LRT,CTRS Location: [address] Dayroom   Goal Area(s) Addresses:  Patient will define components of whole wellness. Patient will verbalize benefit of whole wellness.   Group Description:  Exercise.  LRT and patients talked about the components of wellness and why they are important for the wellbeing of each individual.  LRT explained the group was going to do at least 30 minutes of exercise and they would take turns leading the group in an exercise of their choosing.  Water was provided for the patients, which they were encouraged to drink if they needed it.  Patients were also encouraged to take breaks if they were getting tired or overwhelmed.    Affect/Mood: Sleepy   Participation Level: None   Participation Quality: None   Behavior: Sleep   Speech/Thought Process: N/A   Insight: N/A   Judgement: N/A   Modes of Intervention: Music and Exercise   Patient Response to Interventions:  N/A   Education Outcome:  Acknowledges education and In group clarification offered    Clinical Observations/Individualized Feedback: Pt did not participate.  Pt was asleep during the whole group.    Plan: Continue to engage patient in RT group sessions 2-3x/week.   Caroll Rancher, LRT,CTRS  06/16/2022 11:01 AM

## 2022-06-16 NOTE — Progress Notes (Signed)
   06/16/22 0530  Sleep  Number of Hours 8

## 2022-06-16 NOTE — Progress Notes (Signed)
Adult Psychoeducational Group Note  Date:  06/16/2022 Time:  9:05 PM  Group Topic/Focus:  Wrap-Up Group:   The focus of this group is to help patients review their daily goal of treatment and discuss progress on daily workbooks.  Participation Level:  Active  Participation Quality:  Appropriate and Attentive  Affect:  Lethargic  Cognitive:  Appropriate  Insight: Appropriate  Engagement in Group:  Engaged  Modes of Intervention:  Discussion  Additional Comments:    Pt rated her day 4/10. No goals were set today. Pt actively engaged in the Wrap up group however yawned and dazed throughout the group. Pt reported her medication makes her too sleepy but she likes the groups.   Cheryl Adams 06/16/2022, 9:05 PM

## 2022-06-17 ENCOUNTER — Encounter (HOSPITAL_COMMUNITY): Payer: Self-pay

## 2022-06-17 DIAGNOSIS — F333 Major depressive disorder, recurrent, severe with psychotic symptoms: Secondary | ICD-10-CM | POA: Diagnosis not present

## 2022-06-17 MED ORDER — CLOZAPINE 100 MG PO TABS
100.0000 mg | ORAL_TABLET | Freq: Every day | ORAL | Status: DC
  Administered 2022-06-17: 100 mg via ORAL
  Filled 2022-06-17 (×2): qty 1

## 2022-06-17 MED ORDER — CLOZAPINE 25 MG PO TABS
25.0000 mg | ORAL_TABLET | Freq: Every day | ORAL | Status: DC
  Administered 2022-06-18 – 2022-06-19 (×2): 25 mg via ORAL
  Filled 2022-06-17 (×4): qty 1

## 2022-06-17 NOTE — Group Note (Signed)
Recreation Therapy Group Note   Group Topic:Leisure Education  Group Date: 06/17/2022 Start Time: 1005 End Time: 1050 Facilitators: Caroll Rancher, LRT,CTRS Location: [address] Dayroom   Goal Area(s) Addresses:  Patient will successfully identify positive leisure and recreation activities.  Patient will acknowledge benefits of participation in healthy leisure activities post discharge.  Patient will actively work with peers toward a shared goal.   Group Description: Pictionary. In groups of 5-7, patients took turns trying to guess the picture being drawn on the board by their teammate.  If the team guessed the correct answer, they won a point.  If the team guessed wrong, the other team got a chance to steal the point. After several rounds of game play, the team with the most points were declared winners. Post-activity discussion reviewed benefits of positive recreation outlets: reducing stress, improving coping mechanisms, increasing self-esteem, and building larger support systems.   Affect/Mood: Sleepy   Participation Level: None   Participation Quality: None   Behavior: Sleep   Speech/Thought Process: None   Insight: None   Judgement: None   Modes of Intervention: Competitive Play   Patient Response to Interventions:  None   Education Outcome:  Acknowledges education and In group clarification offered    Clinical Observations/Individualized Feedback: Pt slept throughout group.      Plan: Continue to engage patient in RT group sessions 2-3x/week.   Caroll Rancher, LRT,CTRS 06/17/2022 1:36 PM

## 2022-06-17 NOTE — Progress Notes (Signed)
Pt was encouraged but didn't attend orientation/goals group. ?

## 2022-06-17 NOTE — Progress Notes (Signed)
Adult Psychoeducational Group Note  Date:  06/17/2022 Time:  9:47 PM  Group Topic/Focus:  Wrap-Up Group:   The focus of this group is to help patients review their daily goal of treatment and discuss progress on daily workbooks.  Participation Level:  Active  Participation Quality:  Appropriate  Affect:  Appropriate  Cognitive:  Appropriate  Insight: Appropriate  Engagement in Group:  Developing/Improving  Modes of Intervention:  Discussion  Additional Comments:  Pt stated her goal for today was to focus on her treatment plan. Pt stated she accomplished her goal today. Pt stated she talked with her doctor and with her social worker about her care today. Pt rated her overall day a 4 out of 10. Pt stated she made no calls today. Pt stated she felt better about herself tonight. Pt stated she was able to attend all meals today. Pt stated she took all medications provided today. Pt stated her appetite was fair today. Pt rated her sleep last night was fair. Pt stated the goal tonight was to get some rest. Pt stated she had no physical pain tonight. Pt deny visual hallucinations and auditory issues tonight. Pt denies thoughts of harming herself or others. Pt stated she would alert staff if anything changed.  Felipa Furnace 06/17/2022, 9:47 PM
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
## 2022-06-17 NOTE — Progress Notes (Signed)
   06/17/22 0800  Psych Admission Type (Psych Patients Only)  Admission Status Voluntary  Psychosocial Assessment  Patient Complaints Confusion  Eye Contact Avoids  Facial Expression Blank  Affect Blunted  Speech Soft  Interaction Cautious;Guarded  Motor Activity Slow  Appearance/Hygiene Disheveled  Behavior Characteristics Cooperative  Mood Pleasant  Aggressive Behavior  Effect No apparent injury  Thought Process  Coherency Disorganized  Content WDL  Delusions None reported or observed  Perception WDL  Hallucination None reported or observed  Judgment Poor  Confusion Mild  Danger to Self  Current suicidal ideation? Denies  Self-Injurious Behavior No self-injurious ideation or behavior indicators observed or expressed   Agreement Not to Harm Self Yes  Description of Agreement Verbal  Danger to Others  Danger to Others None reported or observed

## 2022-06-17 NOTE — BH IP Treatment Plan (Signed)
Interdisciplinary Treatment and Diagnostic Plan Update  06/17/2022 Time of Session: 10:30am  Cheryl Adams MRN: [id number]  Principal Diagnosis: Schizoaffective disorder, depressive type [facility])  Secondary Diagnoses: Principal Problem:   Schizoaffective disorder, depressive type ([place]) Active Problems:   Insomnia   GAD (generalized anxiety disorder)   Vitamin D deficiency   Current Medications:  Current Facility-Administered Medications  Medication Dose Route Frequency Provider Last Rate Last Admin   acetaminophen (TYLENOL) tablet 500 mg  500 mg Oral Q6H PRN Massengill, Ovid Curd, MD       amLODipine (NORVASC) tablet 10 mg  10 mg Oral Daily Massengill, Ovid Curd, MD   10 mg at 06/17/22 [id number]   antiseptic oral rinse (BIOTENE) solution 15 mL  15 mL Mouth Rinse 5 X Daily PRN Harlow Asa, MD       atenolol (TENORMIN) tablet 25 mg  25 mg Oral Daily Massengill, Ovid Curd, MD   25 mg at 06/17/22 [id number]   benztropine mesylate (COGENTIN) injection 1 mg  1 mg Intramuscular BID PRN Harlow Asa, MD       cloZAPine (CLOZARIL) tablet 62.5 mg  62.5 mg Oral Q12H Massengill, Ovid Curd, MD       Followed by   Derrill Memo ON 06/19/2022] cloZAPine (CLOZARIL) tablet 75 mg  75 mg Oral Q12H Massengill, Nathan, MD       docusate sodium (COLACE) capsule 100 mg  100 mg Oral Daily PRN Nelda Marseille, Amy E, MD       ferrous sulfate tablet 325 mg  325 mg Oral Daily Merrily Brittle, DO   325 mg at 06/17/22 [id number]   hydrOXYzine (ATARAX) tablet 25 mg  25 mg Oral TID PRN Janine Limbo, MD   25 mg at 06/16/22 2047   LORazepam (ATIVAN) injection 1 mg  1 mg Intramuscular Q6H PRN Massengill, Ovid Curd, MD   1 mg at 05/28/22 1623   OLANZapine zydis (ZYPREXA) disintegrating tablet 5 mg  5 mg Oral Q8H PRN Massengill, Ovid Curd, MD   5 mg at 05/24/22 2154   And   LORazepam (ATIVAN) tablet 1 mg  1 mg Oral Q8H PRN Massengill, Ovid Curd, MD       And   ziprasidone (GEODON) injection 20 mg  20 mg Intramuscular Q8H PRN Massengill, Ovid Curd, MD        metFORMIN (GLUCOPHAGE-XR) 24 hr tablet 500 mg  500 mg Oral Q breakfast Massengill, Nathan, MD   500 mg at 06/17/22 [id number]   sertraline (ZOLOFT) tablet 100 mg  100 mg Oral Daily Massengill, Ovid Curd, MD   100 mg at 06/17/22 [id number]   traZODone (DESYREL) tablet 50 mg  50 mg Oral QHS PRN Janine Limbo, MD   50 mg at 06/16/22 2047   Vitamin D (Ergocalciferol) (DRISDOL) capsule 50,000 Units  50,000 Units Oral Q7 days Nicholes Rough, NP   50,000 Units at 06/15/22 0941   PTA Medications: Medications Prior to Admission  Medication Sig Dispense Refill Last Dose   ferrous sulfate 325 (65 FE) MG tablet Take 325 mg by mouth daily.      risperiDONE (RISPERDAL) 1 MG tablet Take 1 tablet (1 mg total) by mouth daily.      risperiDONE (RISPERDAL) 2 MG tablet Take 1 tablet (2 mg total) by mouth at bedtime.       Patient Stressors: Health problems   Marital or family conflict   Medication change or noncompliance    Patient Strengths: Average or above average intelligence  Supportive family/friends   Treatment Modalities: Medication Management, Group therapy, Case management,  1 to 1 session with clinician, Psychoeducation, Recreational therapy.   Physician Treatment Plan for Primary Diagnosis: Schizoaffective disorder, depressive type (HCC) Long Term Goal(s): Improvement in symptoms so as ready for discharge   Short Term Goals: Ability to verbalize feelings will improve Ability to disclose and discuss suicidal ideas Ability to identify and develop effective coping behaviors will improve Compliance with prescribed medications will improve  Medication Management: Evaluate patient's response, side effects, and tolerance of medication regimen.  Therapeutic Interventions: 1 to 1 sessions, Unit Group sessions and Medication administration.  Evaluation of Outcomes: Progressing  Physician Treatment Plan for Secondary Diagnosis: Principal Problem:   Schizoaffective disorder, depressive type (HCC) Active  Problems:   Insomnia   GAD (generalized anxiety disorder)   Vitamin D deficiency  Long Term Goal(s): Improvement in symptoms so as ready for discharge   Short Term Goals: Ability to verbalize feelings will improve Ability to disclose and discuss suicidal ideas Ability to identify and develop effective coping behaviors will improve Compliance with prescribed medications will improve     Medication Management: Evaluate patient's response, side effects, and tolerance of medication regimen.  Therapeutic Interventions: 1 to 1 sessions, Unit Group sessions and Medication administration.  Evaluation of Outcomes: Progressing   RN Treatment Plan for Primary Diagnosis: Schizoaffective disorder, depressive type (HCC) Long Term Goal(s): Knowledge of disease and therapeutic regimen to maintain health will improve  Short Term Goals: Ability to remain free from injury will improve, Ability to participate in decision making will improve, Ability to verbalize feelings will improve, Ability to disclose and discuss suicidal ideas, and Ability to identify and develop effective coping behaviors will improve  Medication Management: RN will administer medications as ordered by provider, will assess and evaluate patient's response and provide education to patient for prescribed medication. RN will report any adverse and/or side effects to prescribing provider.  Therapeutic Interventions: 1 on 1 counseling sessions, Psychoeducation, Medication administration, Evaluate responses to treatment, Monitor vital signs and CBGs as ordered, Perform/monitor CIWA, COWS, AIMS and Fall Risk screenings as ordered, Perform wound care treatments as ordered.  Evaluation of Outcomes: Progressing   LCSW Treatment Plan for Primary Diagnosis: Schizoaffective disorder, depressive type (HCC) Long Term Goal(s): Safe transition to appropriate next level of care at discharge, Engage patient in therapeutic group addressing  interpersonal concerns.  Short Term Goals: Engage patient in aftercare planning with referrals and resources, Increase social support, Increase emotional regulation, Facilitate acceptance of mental health diagnosis and concerns, Identify triggers associated with mental health/substance abuse issues, and Increase skills for wellness and recovery  Therapeutic Interventions: Assess for all discharge needs, 1 to 1 time with Social worker, Explore available resources and support systems, Assess for adequacy in community support network, Educate family and significant other(s) on suicide prevention, Complete Psychosocial Assessment, Interpersonal group therapy.  Evaluation of Outcomes: Progressing   Progress in Treatment: Attending groups: Yes. Participating in groups: Yes. Taking medication as prescribed: Yes. Toleration medication: Yes. Family/Significant other contact made: Yes, individual(s) contacted:  SPE completed with Sherlean Foot, mother.  Patient understands diagnosis: Yes. Discussing patient identified problems/goals with staff: Yes. Medical problems stabilized or resolved: Yes. Denies suicidal/homicidal ideation: Yes. Issues/concerns per patient self-inventory: Yes. Other: none   New problem(s) identified: No, Describe:  none   New Short Term/Long Term Goal(s): Patient to work towards detox, elimination of symptoms of psychosis, medication management for mood stabilization; elimination of SI thoughts; development of comprehensive mental wellness/sobriety plan.   Patient Goals:  No additional goals identified at this  time. Patient to continue to work towards original goals identified in initial treatment team meeting. CSW will remain available to patient should they voice additional treatment goals.    Discharge Plan or Barriers: No psychosocial barriers identified at this time, patient to return to place of residence when appropriate for discharge.    Reason for Continuation of  Hospitalization: Other; describe Psychosis    Estimated Length of Stay: 1-7 days      Last 3 [place] Suicide Severity Risk Score: Flowsheet Row Ad[facility] (Current) [facility] from 05/18/2022 in [facility] Office Visit from 04/14/2022 in [facility]  C-SSRS RISK CATEGORY No Risk High Risk No Risk       Last PHQ 2/9 Scores:    04/14/2022   10:46 AM 02/24/2022   11:12 AM 01/13/2022    9:14 AM  Depression screen PHQ 2/9  Decreased Interest 0 0 0  Down, Depressed, Hopeless 0 0 0  PHQ - 2 Score 0 0 0    Scribe for Treatment Team: Aram Beecham, Theresia Majors 06/17/2022 10:46 AM

## 2022-06-17 NOTE — Progress Notes (Signed)
Pt stated she had ok day and felt the same    06/17/22 2205  Psych Admission Type (Psych Patients Only)  Admission Status Voluntary  Psychosocial Assessment  Eye Contact Avoids  Facial Expression Blank  Affect Blunted  Speech Soft  Interaction Cautious;Guarded  Motor Activity Slow  Appearance/Hygiene In hospital gown  Behavior Characteristics Cooperative  Mood Preoccupied  Aggressive Behavior  Effect No apparent injury  Thought Process  Coherency Disorganized  Content WDL  Delusions None reported or observed  Perception WDL  Hallucination None reported or observed  Judgment Poor  Confusion Mild  Danger to Self  Current suicidal ideation? Denies

## 2022-06-17 NOTE — Plan of Care (Signed)
  Problem: Education: Goal: Emotional status will improve Outcome: Not Progressing Goal: Mental status will improve Outcome: Not Progressing   Problem: Education: Goal: Will be free of psychotic symptoms Outcome: Not Progressing Goal: Knowledge of the prescribed therapeutic regimen will improve Outcome: Not Progressing

## 2022-06-17 NOTE — Group Note (Signed)
[facility] LCSW Group Therapy Note  Date/Time: 06/17/2022 @ 1pm  Type of Therapy and Topic:  Group Therapy:  Strengths and Qualities  Participation Level:  Minimal  Description of Group:    In this group patients will be asked to explore and define the terms strength ans qualities.  Patients will be guided to discuss their thoughts, feelings, and behaviors as to where strengths and qualities originate. Participants will then list some of their strengths and qualities related to each subject topic. This group will be process-oriented, with patients participating in exploration of their own experiences as well as giving and receiving support and challenge from other group members.  Therapeutic Goals: Patient will identify specific strengths related to their personal life. Patient will identify feelings, thoughts, and beliefs about strengths and qualities. Patient will identify ways their strengths have been used. . Patient will identify situations where they have helped others or made someone else happy. .  Summary of Patient Progress Patient participated in group on today. She engaged in introductions and stated that she has received compliments about her glasses.  Patient discussed some strengths about herself including perservering and not letting bad things get in the way of her success.  Patient had a hard time engaging in the subject matter and stayed quiet for majority of group.      Therapeutic Modalities:   Cognitive Behavioral Therapy Solution Focused Therapy Motivational Interviewing Brief Therapy   Lashawnna Lambrecht, LCSW, LCAS Clincal Social Worker  [facility]

## 2022-06-17 NOTE — Progress Notes (Signed)
   06/17/22 0515  Sleep  Number of Hours 6.25

## 2022-06-18 ENCOUNTER — Inpatient Hospital Stay (HOSPITAL_COMMUNITY)
Admit: 2022-06-18 | Discharge: 2022-06-18 | Disposition: A | Discharge status: Home / Self Care | Payer: 59 | Attending: Psychiatry | Admitting: Psychiatry

## 2022-06-18 DIAGNOSIS — F333 Major depressive disorder, recurrent, severe with psychotic symptoms: Secondary | ICD-10-CM | POA: Diagnosis not present

## 2022-06-18 LAB — COMPREHENSIVE METABOLIC PANEL
ALT: 15 U/L (ref 0–44)
AST: 14 U/L — ABNORMAL LOW (ref 15–41)
Albumin: 4.1 g/dL (ref 3.5–5.0)
Alkaline Phosphatase: 69 U/L (ref 38–126)
Anion gap: 6 (ref 5–15)
BUN: 11 mg/dL (ref 6–20)
CO2: 26 mmol/L (ref 22–32)
Calcium: 9.3 mg/dL (ref 8.9–10.3)
Chloride: 109 mmol/L (ref 98–111)
Creatinine, Ser: 0.7 mg/dL (ref 0.44–1.00)
GFR, Estimated: >60 mL/min (ref >60)
Glucose, Bld: 117 mg/dL — ABNORMAL HIGH (ref 70–99)
Potassium: 3.5 mmol/L (ref 3.5–5.1)
Sodium: 141 mmol/L (ref 135–145)
Total Bilirubin: 0.1 mg/dL — ABNORMAL LOW (ref 0.3–1.2)
Total Protein: 8 g/dL (ref 6.5–8.1)

## 2022-06-18 LAB — TSH: TSH: 0.964 u[IU]/mL (ref 0.350–4.500)

## 2022-06-18 LAB — HIV ANTIBODY (ROUTINE TESTING W REFLEX): HIV Screen 4th Generation wRfx: NONREACTIVE

## 2022-06-18 MED ORDER — CLOZAPINE 25 MG PO TABS
125.0000 mg | ORAL_TABLET | Freq: Every day | ORAL | Status: DC
  Administered 2022-06-18: 125 mg via ORAL
  Filled 2022-06-18 (×3): qty 1

## 2022-06-18 NOTE — Group Note (Signed)
Occupational Therapy Group Note   Group Topic:Goal Setting  Group Date: 06/18/2022 Start Time: 1415 End Time: 1515 Facilitators: Ted Mcalpine, OT   Group Description: Group encouraged engagement and participation through discussion focused on goal setting. Group members were introduced to goal-setting using the SMART Goal framework, identifying goals as Specific, Measureable, Acheivable, Relevant, and Time-Bound. Group members took time from group to create their own personal goal reflecting the SMART goal template and shared for review by peers and OT.    Therapeutic Goal(s):  Identify at least one goal that fits the SMART framework    Participation Level: Active and Hyperverbal   Participation Quality: Minimal Cues   Behavior: Hyperverbal and Impulsive   Speech/Thought Process: Disorganized   Affect/Mood: Elevated   Insight: Limited   Judgement: Limited   Individualization: pt was engaged in their participation of group discussion/activity. New skills were identified  Modes of Intervention: Discussion and Education  Patient Response to Interventions:  Attentive and Engaged   Plan: Continue to engage patient in OT groups 2 - 3x/week.  06/18/2022  Ted Mcalpine, OT Kerrin Champagne, OT

## 2022-06-18 NOTE — Progress Notes (Signed)
   06/18/22 0515  Sleep  Number of Hours 7.5    
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
## 2022-06-18 NOTE — Group Note (Signed)
Recreation Therapy Group Note   Group Topic:Communication  Group Date: 06/18/2022 Start Time: 1000 End Time: 1040 Facilitators: Caroll Rancher, LRT,CTRS Location: [address] Dayroom   Goal Area(s) Addresses:  Patient will effectively work with peer towards shared goal.  Patient will identify skills used to make activity successful.  Patient will identify how skills used during activity can be applied to reach post d/c goals.    Group Description: [employer]. In teams of 5-6, patients were given 12 craft pipe cleaners. Using the materials provided, patients were instructed to compete again the opposing team(s) to build the tallest free-standing structure from floor level. The activity was timed; difficulty increased by Clinical research associate as Production designer, theatre/television/film continued.  Systematically resources were removed with additional directions for example, placing one arm behind their back, working in silence, and shape stipulations. LRT facilitated post-activity discussion reviewing team processes and necessary communication skills involved in completion. Patients were encouraged to reflect how the skills utilized, or not utilized, in this activity can be incorporated to positively impact support systems post discharge.   Affect/Mood: Drowsy   Participation Level: Engaged   Participation Quality: Independent   Behavior: Appropriate   Speech/Thought Process: Focused   Insight: Fair   Judgement: Fair    Modes of Intervention: STEM Activity   Patient Response to Interventions:  Engaged   Education Outcome:  Acknowledges education and In group clarification offered    Clinical Observations/Individualized Feedback: Pt was quiet but seemed to be engaged with peers in completing activity.  Pt was able to get through activity without going to sleep.  Pt did express making sure the pipe cleaners were connected was the hardest part.      Plan: Continue to engage patient in RT group  sessions 2-3x/week.   Caroll Rancher, LRT,CTRS 06/18/2022 11:22 AM

## 2022-06-18 NOTE — [facility] Group Notes (Signed)
Adult Psychoeducational Group Note  Date:  06/18/2022 Time:  10:21 PM  Group Topic/Focus:  Wrap-Up Group:   The focus of this group is to help patients review their daily goal of treatment and discuss progress on daily workbooks.  Participation Level:  Active  Participation Quality:  Attentive  Affect:  Appropriate  Cognitive:  Alert  Insight: Appropriate  Engagement in Group:  Engaged  Modes of Intervention:  Discussion  Additional Comments:  Patient attended and participated in the Wrap-up group.  Jearl Klinefelter 06/18/2022, 10:21 PM

## 2022-06-18 NOTE — Progress Notes (Signed)
Pt observed to be confused, disorganized and paranoid on interactions. Noted closing her eyes while ambulating in hall, avoids looking at staff when engaged with eyes closed majority of this shift. Denies SI, HI, AVH and pain when assessed. Reports fair sleep last night "I woke up multiple times". Rates her anxiety, hopelessness and depression all 4/10 but couldn't elaborate on stressors. Ate 50% of lunch, 75% of snacks and 85% of dinner with increased prompts from staff. Showered, laundry done with staff assistance. Safety checks maintained at Q 15 minutes intervals without outburst or self harm gestures. All medications administered with verbal education and effects monitored. Pt attends groups as scheduled with limited interactions. Off unit to courtyard with peers, returned without issues.

## 2022-06-18 NOTE — Progress Notes (Signed)
Pt was encouraged but didn't attend orientation/goals group. ?

## 2022-06-18 NOTE — Progress Notes (Signed)
   06/18/22 2215  Psych Admission Type (Psych Patients Only)  Admission Status Voluntary  Psychosocial Assessment  Patient Complaints Anxiety;Confusion  Eye Contact Avoids  Facial Expression Blank  Affect Blunted  Speech Soft  Interaction Cautious;Guarded  Motor Activity Slow  Appearance/Hygiene In hospital gown  Behavior Characteristics Cooperative  Mood Suspicious;Preoccupied  Aggressive Behavior  Effect No apparent injury  Thought Process  Coherency Disorganized  Content WDL  Delusions None reported or observed  Perception WDL  Hallucination None reported or observed  Judgment Poor  Confusion Mild  Danger to Self  Current suicidal ideation? Denies

## 2022-06-19 ENCOUNTER — Inpatient Hospital Stay (HOSPITAL_COMMUNITY): Payer: 59

## 2022-06-19 ENCOUNTER — Other Ambulatory Visit: Payer: Self-pay | Admitting: Obstetrics and Gynecology

## 2022-06-19 DIAGNOSIS — F333 Major depressive disorder, recurrent, severe with psychotic symptoms: Secondary | ICD-10-CM | POA: Diagnosis not present

## 2022-06-19 LAB — URINALYSIS, COMPLETE (UACMP) WITH MICROSCOPIC
Bilirubin Urine: NEGATIVE
Glucose, UA: NEGATIVE mg/dL
Ketones, ur: NEGATIVE mg/dL
Leukocytes,Ua: NEGATIVE
Nitrite: NEGATIVE
Protein, ur: 30 mg/dL — AB
Specific Gravity, Urine: 1.026 (ref 1.005–1.030)
pH: 5 (ref 5.0–8.0)

## 2022-06-19 LAB — CLOZAPINE (CLOZARIL)
Clozapine Lvl: 79 ng/mL — ABNORMAL LOW (ref 350–600)
NorClozapine: 46 ng/mL
Total(Cloz+Norcloz): 125 ng/mL

## 2022-06-19 LAB — N-METHYL-D-ASPARTATE RECPT.IGG: N-methyl-D-Aspartate Recpt.IgG: NEGATIVE

## 2022-06-19 LAB — PROLACTIN: Prolactin: 18.5 ng/mL (ref 4.8–23.3)

## 2022-06-19 LAB — ANA W/REFLEX IF POSITIVE: Anti Nuclear Antibody (ANA): NEGATIVE

## 2022-06-19 LAB — RPR: RPR Ser Ql: NONREACTIVE

## 2022-06-19 LAB — CERULOPLASMIN: Ceruloplasmin: 26.1 mg/dL (ref 19.0–39.0)

## 2022-06-19 MED ORDER — CLOZAPINE 25 MG PO TABS
162.5000 mg | ORAL_TABLET | Freq: Every day | ORAL | Status: AC
  Administered 2022-06-20: 162.5 mg via ORAL
  Filled 2022-06-19: qty 3

## 2022-06-19 MED ORDER — CLOZAPINE 25 MG PO TABS
187.5000 mg | ORAL_TABLET | Freq: Every day | ORAL | Status: AC
Start: 2022-06-22 — End: 2022-06-22
  Administered 2022-06-22: 187.5 mg via ORAL
  Filled 2022-06-19: qty 4

## 2022-06-19 MED ORDER — CLOZAPINE 100 MG PO TABS
200.0000 mg | ORAL_TABLET | Freq: Every day | ORAL | Status: DC
  Administered 2022-06-23: 200 mg via ORAL
  Filled 2022-06-19 (×2): qty 2

## 2022-06-19 MED ORDER — CLOZAPINE 25 MG PO TABS
150.0000 mg | ORAL_TABLET | Freq: Every day | ORAL | Status: AC
  Administered 2022-06-19: 150 mg via ORAL
  Filled 2022-06-19: qty 2

## 2022-06-19 MED ORDER — MEGESTROL ACETATE 40 MG PO TABS
40.0000 mg | ORAL_TABLET | Freq: Every day | ORAL | Status: DC
  Administered 2022-06-19 – 2022-06-23 (×5): 40 mg via ORAL
  Filled 2022-06-19 (×8): qty 1

## 2022-06-19 MED ORDER — CLOZAPINE 25 MG PO TABS
175.0000 mg | ORAL_TABLET | Freq: Every day | ORAL | Status: AC
  Administered 2022-06-21: 175 mg via ORAL
  Filled 2022-06-19: qty 3

## 2022-06-19 NOTE — Group Note (Signed)
Recreation Therapy Group Note   Group Topic:Self-Esteem  Group Date: 06/19/2022 Start Time: 1011 End Time: 1055 Facilitators: Caroll Rancher, LRT,CTRS Location: [address] Dayroom   Goal Area(s) Addresses:  Patient will appropriately identify what self esteem is.  Patient will create a shield of armor describing themselves.  Patient will successfully identify positive attributes about themselves.  Patient will acknowledge benefit of improved self-esteem.    Activity Description:  Self-Esteem Shield. Patient attended a recreation therapy group session focused on self esteem. Patient identified what self esteem is, and why it is important to have high self esteem during group discussion. LRT wrote on the white board, drawing the outline of the shield and labeling the quadrants.  Patient was asked to create their own shield to show off their unique attributes, four quadrants reflected the following:  The Upper Left quadrant- two things or persons they admire most The Upper Right quadrant- two lessons they've learned thus far in life The Lower Left quadrant- at least three qualities that make them unique The Lower Right quadrant- two goals they are working towards   Patients were provided sheets with the shield printed on them and colored pencils, markers and crayons to complete the activity.  Patients were debriefed on the importance of healthy self esteem.    Affect/Mood: N/A   Participation Level: Did not attend    Clinical Observations/Individualized Feedback:     Plan: Continue to engage patient in RT group sessions 2-3x/week.   Caroll Rancher, Antonietta Jewel  06/19/2022 12:41 PM

## 2022-06-19 NOTE — B[facility] Group Notes (Signed)
Spirituality group facilitated by Kathleen Argue, BCC.   Group Description: Group focused on topic of hope. Patients participated in facilitated discussion around topic, connecting with one another around experiences and definitions for hope. Group members engaged with visual explorer photos, reflecting on what hope looks like for them today. Group engaged in discussion around how their definitions of hope are present today in hospital.   Modalities: Psycho-social ed, Adlerian, Narrative, MI   Patient Progress: Cheryl Adams was present at the beginning of group but was called out to speak to the provider soon after group began.  [address], Bcc Pager, [phone]

## 2022-06-19 NOTE — Progress Notes (Signed)
DAR NOTE: Patient presents with flat affect and depressed mood with poor eye contact.  Patient ate 100% of lunch and 75% of dinner.  Denies suicidal thoughts, pain, auditory and visual hallucinations.  Rates depression at 4, hopelessness at 4, and anxiety at 4.  Maintained on routine safety checks.  Medications given as prescribed.  Support and encouragement offered as needed.  Attended group and participated.  Assisted patient with her personal hygiene and observed patient still having menses and passing some clots.  NP made aware.  Patient observed sleeping in the dayroom for majority of the shift.  Offered no complaint.

## 2022-06-19 NOTE — Group Note (Signed)
Type of Therapy and Topic: Group Therapy: Control  Participation Level: Minimal  Description of Group: In this group patients will discuss what is out of their control, what is somewhat in their control, and what is within their control.  They will be encouraged to explore what issues they can control and what issues are out of their control within their daily lives. They will be guided to discuss their thoughts, feelings, and behaviors related to these issues. The group will process together ways to better control things that are well within our own control and how to notice and accept the things that are not within our control. This group will be process-oriented, with patients participating in exploration of their own experiences as well as giving and receiving support and challenge from other group members.  During this group 2 worksheets will be provided to each patient to follow along and fill out.   Therapeutic Goals: 1. Patient will identify what is within their control and what is not within their control. 2. Patient will identify their thoughts and feelings about having control over their own lives. 3. Patient will identify their thoughts and feelings about not having control over everything in their lives.. 4. Patient will identify ways that they can have more control over their own lives. 5. Patient will identify areas were they can allow others to help them or provide assistance.  Summary of Patient Progress: Patient participated appropriately in group and would participate when called upon but did not participate without prompting.  Patient reports that she uses meditation to cope with anxiety.  Patient open to feedback from peers.    Lash Matulich, LCSW, LCAS Clincal Social Worker  [facility]

## 2022-06-19 NOTE — Progress Notes (Signed)
Pt was encouraged but didn't attend orientation/goals group. ?
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
## 2022-06-19 NOTE — Progress Notes (Signed)
Adult Psychoeducational Group Note  Date:  06/19/2022 Time:  8:30 PM  Group Topic/Focus:  Wrap-Up Group:   The focus of this group is to help patients review their daily goal of treatment and discuss progress on daily workbooks.  Participation Level:  Minimal  Participation Quality:  Appropriate  Affect:  Appropriate  Cognitive:  Appropriate  Insight: Appropriate  Engagement in Group:  Limited  Modes of Intervention:  Discussion  Additional Comments:  Pt stated her goal for today was to focus on her treatment plan. Pt stated she accomplished her goal today. Pt stated  she talked with her doctor and with her social worker about her care today. Pt rated her overall day a 4 out of 10. Pt stated she made no calls today. Pt stated she felt better about herself tonight. Pt stated she was able to attend all meals today. Pt stated she took all medications provided today. Pt stated her appetite was pretty good today. Pt rated her sleep last night was fair. Pt stated the goal tonight was to get some rest. Pt stated she had no physical pain tonight. Pt deny visual hallucinations and auditory issues tonight. Pt denies thoughts of harming herself or others. Pt stated she would alert staff if anything changed.  Felipa Furnace 06/19/2022, 8:30 PM

## 2022-06-19 NOTE — Progress Notes (Signed)
   06/19/22 0515  Sleep  Number of Hours 7

## 2022-06-20 DIAGNOSIS — F333 Major depressive disorder, recurrent, severe with psychotic symptoms: Secondary | ICD-10-CM | POA: Diagnosis not present

## 2022-06-20 LAB — HEAVY METALS, BLOOD
Arsenic: 2 ug/L (ref 0–9)
Lead: <1 ug/dL (ref 0.0–3.4)
Mercury: <1 ug/L (ref 0.0–14.9)

## 2022-06-20 NOTE — Progress Notes (Addendum)
Adult Psychoeducational Group Note  Date:  06/20/2022 Time:  8:32 PM  Group Topic/Focus:  Wrap-Up Group:   The focus of this group is to help patients review their daily goal of treatment and discuss progress on daily workbooks.  Participation Level:  Active  Participation Quality:  Appropriate  Affect:  Appropriate  Cognitive:  Limited  Insight: Limited  Engagement in Group:  Guarded   Modes of Intervention:  Discussion   Additional Comments:   Pt states that she had a good day and she was able to go outside and socialize more with her peers today. Pt endorses attending group but doesn't recall what the group was about. One of her goals was to focus on her treatment plan and stay on her medication.  Vevelyn Pat 06/20/2022, 8:32 PM

## 2022-06-20 NOTE — [facility] Group Notes (Signed)
[facility] Group Notes:  (Social Work Group)   Date:  06/20/2022   Time:  11:00am - 12:00pm  Type of Therapy:  Group Therapy  Participation Level:  Minimal  Participation Quality:  Drowsy  Affect:  Flat  Cognitive:  Appropriate  Insight:  Fair  Engagement in Group:  Limited  Modes of Intervention:   Mindfulness, Cognitive Behavioral Therapy, Music Activity  Summary of Progress/Problems:  Before group member introductions, PMHNP student facilitator lead a mindfulness activity using the 5 Finger Breathing technique.  Patient participated in mindfulness activity.   Today's group topic was on rejection and how patients handle the feelings and emotions associated with being rejected in life.  Patient  was able to give examples of what rejection meant to her, and she listed the negative and positive ways to respond when feeling rejected.  Pt needed a lot of encouragement to answer questions PMHNP student facilitator posed, and pt sat for most of the group with her eyes closed.    Pt expressed that God was her support that she turns to when pt feels rejected in life.  Garvin Fila, PMHNP student 06/20/2022, 3:35 PM
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
## 2022-06-20 NOTE — Progress Notes (Signed)
   06/19/22 2045  Psych Admission Type (Psych Patients Only)  Admission Status Voluntary  Psychosocial Assessment  Patient Complaints None  Eye Contact Avoids  Facial Expression Flat  Affect Appropriate to circumstance;Blunted  Speech Soft  Interaction Childlike  Motor Activity Slow  Appearance/Hygiene Unremarkable  Behavior Characteristics Cooperative;Appropriate to situation  Mood Pleasant;Preoccupied  Thought Process  Coherency Disorganized  Content Preoccupation;Paranoia  Delusions Paranoid  Perception UTA  Hallucination None reported or observed  Judgment Poor  Confusion Mild  Danger to Self  Current suicidal ideation? Denies

## 2022-06-21 DIAGNOSIS — F333 Major depressive disorder, recurrent, severe with psychotic symptoms: Secondary | ICD-10-CM | POA: Diagnosis not present

## 2022-06-21 NOTE — Progress Notes (Signed)
   06/21/22 0800  Psych Admission Type (Psych Patients Only)  Admission Status Voluntary  Psychosocial Assessment  Patient Complaints None  Eye Contact Avoids  Facial Expression Fixed smile  Affect Appropriate to circumstance  Speech Soft  Interaction Childlike;Cautious  Motor Activity Slow  Appearance/Hygiene Unremarkable  Behavior Characteristics Cooperative;Appropriate to situation  Mood Preoccupied;Pleasant  Thought Process  Coherency Disorganized  Content Preoccupation;Paranoia  Delusions Paranoid  Perception UTA  Hallucination None reported or observed  Judgment Poor  Confusion Mild  Danger to Self  Current suicidal ideation? Denies  Danger to Others  Danger to Others None reported or observed
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
## 2022-06-21 NOTE — Progress Notes (Signed)
Adult Psychoeducational Group Note  Date:  06/21/2022 Time:  9:12 PM  Group Topic/Focus:  Wrap-Up Group:   The focus of this group is to help patients review their daily goal of treatment and discuss progress on daily workbooks.  Participation Level:  Active  Participation Quality:  Appropriate  Affect:  Flat  Cognitive:  Appropriate  Insight: Limited  Engagement in Group:  Engaged and Limited  Modes of Intervention:  Discussion  Additional Comments:   Pt has been refusing to make eye contact this week and continued this way during group discussion. Pt seemed anxious and guarded but was able to answer the questions prompted. Pt states she had a good day, she enjoyed music therapy and was able to walk around outside. Pt states that one of her goals is to be more social and talk more often during group.  Cheryl Adams 06/21/2022, 9:12 PM

## 2022-06-21 NOTE — Group Note (Signed)
[facility] LCSW Group Therapy Note  Date/Time:  06/21/2022    Type of Therapy and Topic:  Group Therapy: Music and Mood  Participation Level:  Minimal   Description of Group: In this process group, members listened to a variety of music through choosing from CSW's list #1 through #25.  Patients identified the messages received from those songs and how the music affected their emotions.  Patients were encouraged to use music as a coping skill at home, but to be mindful of the choices made.  Patients discussed how this knowledge can help with wellness and recovery in various ways including managing depression and anxiety as well as encouraging healthy sleep habits.    Therapeutic Goals: Patients will explore the impact of different songs on mood Patients will verbalize the thoughts they have when listening to different types of music Patients will identify music that is soothing to them as well as music that is energizing to them Patients will discuss how to use this knowledge to assist in maintaining wellness and recovery Patients will explore the use of music as a coping skill Patients will encourage one another  Summary of Patient Progress:  At the beginning of group, patient expressed music that makes her happy is typically 90's, early 2000's and neo soul and music that makes her angry is typically heavy metal.  Patient listened and although she did not volunteer information, she did answer direct questions with less hesitation than previously.  At the end of group patient stated she felt "hopeful."  Therapeutic Modalities: Solution Focused Brief Therapy Activity   Ambrose Mantle, LCSW

## 2022-06-22 ENCOUNTER — Encounter (HOSPITAL_COMMUNITY): Payer: Self-pay

## 2022-06-22 DIAGNOSIS — Z20822 Contact with and (suspected) exposure to covid-19: Secondary | ICD-10-CM | POA: Diagnosis not present

## 2022-06-22 DIAGNOSIS — F333 Major depressive disorder, recurrent, severe with psychotic symptoms: Secondary | ICD-10-CM | POA: Diagnosis not present

## 2022-06-22 DIAGNOSIS — I10 Essential (primary) hypertension: Secondary | ICD-10-CM | POA: Diagnosis not present

## 2022-06-22 DIAGNOSIS — F251 Schizoaffective disorder, depressive type: Secondary | ICD-10-CM | POA: Diagnosis not present

## 2022-06-22 DIAGNOSIS — F061 Catatonic disorder due to known physiological condition: Secondary | ICD-10-CM | POA: Diagnosis not present

## 2022-06-22 NOTE — Progress Notes (Signed)
   06/22/22 1200  Psych Admission Type (Psych Patients Only)  Admission Status Voluntary  Psychosocial Assessment  Patient Complaints Suspiciousness  Eye Contact Avoids  Facial Expression Flat  Affect Appropriate to circumstance  Speech Soft  Interaction Childlike  Motor Activity Slow  Appearance/Hygiene Unremarkable  Behavior Characteristics Appropriate to situation  Mood Preoccupied  Thought Process  Coherency Disorganized  Content Preoccupation  Delusions Paranoid  Perception WDL  Hallucination None reported or observed  Judgment Poor  Confusion None  Danger to Self  Current suicidal ideation? Denies  Danger to Others  Danger to Others None reported or observed

## 2022-06-22 NOTE — Progress Notes (Signed)
   06/22/22 2145  Psych Admission Type (Psych Patients Only)  Admission Status Voluntary  Psychosocial Assessment  Patient Complaints Suspiciousness  Eye Contact Avoids  Facial Expression Flat  Affect Appropriate to circumstance  Speech Slow;Soft  Interaction Childlike  Motor Activity Slow  Appearance/Hygiene Disheveled  Behavior Characteristics Appropriate to situation  Mood Preoccupied  Aggressive Behavior  Effect No apparent injury  Thought Process  Coherency Circumstantial;Disorganized  Content Preoccupation  Delusions Paranoid  Perception WDL  Hallucination None reported or observed  Judgment Poor  Confusion None  Danger to Self  Current suicidal ideation? Denies  Danger to Others  Danger to Others None reported or observed

## 2022-06-22 NOTE — Group Note (Signed)
[facility] LCSW Group Therapy Note    Group Date: 06/22/2022 Start Time: 1300 End Time: 1400  Type of Therapy and Topic:  Group Therapy:  Overcoming Obstacles  Participation Level:  [facility] PARTICIPATION LEVEL: Active   Description of Group:   In this group patients will be encouraged to explore what they see as obstacles to their own wellness and recovery. They will be guided to discuss their thoughts, feelings, and behaviors related to these obstacles. The group will process together ways to cope with barriers, with attention given to specific choices patients can make. Each patient will be challenged to identify changes they are motivated to make in order to overcome their obstacles. This group will be process-oriented, with patients participating in exploration of their own experiences as well as giving and receiving support and challenge from other group members.  Therapeutic Goals: 1. Patient will identify personal and current obstacles as they relate to admission. 2. Patient will identify barriers that currently interfere with their wellness or overcoming obstacles.  3. Patient will identify feelings, thought process and behaviors related to these barriers. 4. Patient will identify two changes they are willing to make to overcome these obstacles:    Summary of Patient Progress   Patient participated appropriately in group and seemed to have fair insight.  Patient discussed talking to others more about her obstacles and problems can help solve her problems.  Patient was still guarded at times but seemed to open up more than previous groups.    Therapeutic Modalities:   Cognitive Behavioral Therapy Solution Focused Therapy Motivational Interviewing Relapse Prevention Therapy   Gurley Climer E Sandar Krinke, LCSW

## 2022-06-22 NOTE — BH IP Treatment Plan (Signed)
Interdisciplinary Treatment and Diagnostic Plan Update  06/22/2022 Time of Session: update Cheryl Adams MRN: [id number]  Principal Diagnosis: Schizoaffective disorder, depressive type [facility])  Secondary Diagnoses: Principal Problem:   Schizoaffective disorder, depressive type (HCC) Active Problems:   Insomnia   GAD (generalized anxiety disorder)   Vitamin D deficiency   Current Medications:  Current Facility-Administered Medications  Medication Dose Route Frequency Provider Last Rate Last Admin   acetaminophen (TYLENOL) tablet 500 mg  500 mg Oral Q6H PRN Massengill, Harrold Donath, MD       amLODipine (NORVASC) tablet 10 mg  10 mg Oral Daily Massengill, Harrold Donath, MD   10 mg at 06/22/22 9562   antiseptic oral rinse (BIOTENE) solution 15 mL  15 mL Mouth Rinse 5 X Daily PRN Comer Locket, MD       atenolol (TENORMIN) tablet 25 mg  25 mg Oral Daily Massengill, Harrold Donath, MD   25 mg at 06/22/22 1308   cloZAPine (CLOZARIL) tablet 187.5 mg  187.5 mg Oral QHS Massengill, Harrold Donath, MD       Followed by   [name] ON 06/23/2022] cloZAPine (CLOZARIL) tablet 200 mg  200 mg Oral QHS Massengill, Nathan, MD       docusate sodium (COLACE) capsule 100 mg  100 mg Oral Daily PRN Mason Jim, Amy E, MD       ferrous sulfate tablet 325 mg  325 mg Oral Daily Princess Bruins, DO   325 mg at 06/22/22 6578   hydrOXYzine (ATARAX) tablet 25 mg  25 mg Oral TID PRN Phineas Inches, MD   25 mg at 06/17/22 2027   OLANZapine zydis (ZYPREXA) disintegrating tablet 5 mg  5 mg Oral Q8H PRN Massengill, Harrold Donath, MD   5 mg at 05/24/22 2154   And   LORazepam (ATIVAN) tablet 1 mg  1 mg Oral Q8H PRN Massengill, Harrold Donath, MD       And   ziprasidone (GEODON) injection 20 mg  20 mg Intramuscular Q8H PRN Massengill, Nathan, MD       megestrol (MEGACE) tablet 40 mg  40 mg Oral Daily Hermina Staggers, MD   40 mg at 06/22/22 4696   metFORMIN (GLUCOPHAGE-XR) 24 hr tablet 500 mg  500 mg Oral Q breakfast Massengill, Harrold Donath, MD   500 mg at 06/22/22 2952    sertraline (ZOLOFT) tablet 100 mg  100 mg Oral Daily Massengill, Harrold Donath, MD   100 mg at 06/22/22 8413   Vitamin D (Ergocalciferol) (DRISDOL) capsule 50,000 Units  50,000 Units Oral Q7 days Starleen Blue, NP   50,000 Units at 06/22/22 2440   PTA Medications: Medications Prior to Admission  Medication Sig Dispense Refill Last Dose   ferrous sulfate 325 (65 FE) MG tablet Take 325 mg by mouth daily.      risperiDONE (RISPERDAL) 1 MG tablet Take 1 tablet (1 mg total) by mouth daily.      risperiDONE (RISPERDAL) 2 MG tablet Take 1 tablet (2 mg total) by mouth at bedtime.       Patient Stressors: Health problems   Marital or family conflict   Medication change or noncompliance    Patient Strengths: Average or above average intelligence  Supportive family/friends   Treatment Modalities: Medication Management, Group therapy, Case management,  1 to 1 session with clinician, Psychoeducation, Recreational therapy.   Physician Treatment Plan for Primary Diagnosis: Schizoaffective disorder, depressive type (HCC) Long Term Goal(s): Improvement in symptoms so as ready for discharge   Short Term Goals: Ability to verbalize feelings will improve Ability  to disclose and discuss suicidal ideas Ability to identify and develop effective coping behaviors will improve Compliance with prescribed medications will improve  Medication Management: Evaluate patient's response, side effects, and tolerance of medication regimen.  Therapeutic Interventions: 1 to 1 sessions, Unit Group sessions and Medication administration.  Evaluation of Outcomes: Progressing  Physician Treatment Plan for Secondary Diagnosis: Principal Problem:   Schizoaffective disorder, depressive type (HCC) Active Problems:   Insomnia   GAD (generalized anxiety disorder)   Vitamin D deficiency  Long Term Goal(s): Improvement in symptoms so as ready for discharge   Short Term Goals: Ability to verbalize feelings will  improve Ability to disclose and discuss suicidal ideas Ability to identify and develop effective coping behaviors will improve Compliance with prescribed medications will improve     Medication Management: Evaluate patient's response, side effects, and tolerance of medication regimen.  Therapeutic Interventions: 1 to 1 sessions, Unit Group sessions and Medication administration.  Evaluation of Outcomes: Progressing   RN Treatment Plan for Primary Diagnosis: Schizoaffective disorder, depressive type (HCC) Long Term Goal(s): Knowledge of disease and therapeutic regimen to maintain health will improve  Short Term Goals: Ability to remain free from injury will improve, Ability to verbalize frustration and anger appropriately will improve, Ability to demonstrate self-control, Ability to participate in decision making will improve, Ability to verbalize feelings will improve, Ability to disclose and discuss suicidal ideas, Ability to identify and develop effective coping behaviors will improve, and Compliance with prescribed medications will improve  Medication Management: RN will administer medications as ordered by provider, will assess and evaluate patient's response and provide education to patient for prescribed medication. RN will report any adverse and/or side effects to prescribing provider.  Therapeutic Interventions: 1 on 1 counseling sessions, Psychoeducation, Medication administration, Evaluate responses to treatment, Monitor vital signs and CBGs as ordered, Perform/monitor CIWA, COWS, AIMS and Fall Risk screenings as ordered, Perform wound care treatments as ordered.  Evaluation of Outcomes: Progressing   LCSW Treatment Plan for Primary Diagnosis: Schizoaffective disorder, depressive type (HCC) Long Term Goal(s): Safe transition to appropriate next level of care at discharge, Engage patient in therapeutic group addressing interpersonal concerns.  Short Term Goals: Engage patient in  aftercare planning with referrals and resources, Increase social support, Increase ability to appropriately verbalize feelings, Increase emotional regulation, Facilitate acceptance of mental health diagnosis and concerns, Facilitate patient progression through stages of change regarding substance use diagnoses and concerns, Identify triggers associated with mental health/substance abuse issues, and Increase skills for wellness and recovery  Therapeutic Interventions: Assess for all discharge needs, 1 to 1 time with Social worker, Explore available resources and support systems, Assess for adequacy in community support network, Educate family and significant other(s) on suicide prevention, Complete Psychosocial Assessment, Interpersonal group therapy.  Evaluation of Outcomes: Progressing   Progress in Treatment: Attending groups: Yes. Participating in groups: No. Taking medication as prescribed: Yes. Toleration medication: Yes. Family/Significant other contact made: Yes, individual(s) contacted:  mother, Shernita Rabinovich  Patient understands diagnosis: No. Discussing patient identified problems/goals with staff: Yes. Medical problems stabilized or resolved: Yes. Denies suicidal/homicidal ideation: Yes. Issues/concerns per patient self-inventory: No.  New problem(s) identified: No, Describe:  none reported   New Short Term/Long Term Goal(s):  medication stabilization, elimination of SI thoughts, development of comprehensive mental wellness plan.    Patient Goals:  No additional goals identified at this time. Patient to continue to work towards original goals identified in initial treatment team meeting. CSW will remain available to patient should they  voice additional treatment goals.   Discharge Plan or Barriers:  No psychosocial barriers identified at this time. Physicians continue to adjust medications to assist with paranoid thoughts.  Patient currently has paranoia around her main support  system, parents.  Patient will be discharged to residence upon discharge.  Patient is connected to med management and therapy upon discharge.   Reason for Continuation of Hospitalization: Delusions  Depression Medication stabilization  Estimated Length of Stay: 5-7 days  Last 3 [place] Suicide Severity Risk Score: Flowsheet Row [facility] ([facility] from 05[facility]18/2022 in [facility] Office Visit from 04/14/2022 in [facility]  C-SSRS RISK CATEGORY No Risk High Risk No Risk       Last PHQ 2/9 Scores:    04/14/2022   10:46 AM 02/24/2022   11:12 AM 01/13/2022    9:14 AM  Depression screen PHQ 2/9  Decreased Interest 0 0 0  Down, Depressed, Hopeless 0 0 0  PHQ - 2 Score 0 0 0    Scribe for Treatment Team: Beatris Si, LCSW 06/22/2022 10:14 AM

## 2022-06-22 NOTE — Progress Notes (Signed)
The focus of this group is to help patients review their daily goal of treatment and discuss progress on daily workbooks.  Pt attended the evening group and responded to all discussion prompts from the Writer. Pt shared that today was a good day on the unit, the highlight of which was getting to go outside to the courtyard for fresh air. "I just sat on the bench and enjoyed it."  Pt told that her goal this week was to communicate more in group. "I don't want to just attend. I want to participate."  Pt rated her day a 4 out of 10 and her affect was appropriate.
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
## 2022-06-23 DIAGNOSIS — F251 Schizoaffective disorder, depressive type: Secondary | ICD-10-CM | POA: Diagnosis not present

## 2022-06-23 LAB — CBC WITH DIFFERENTIAL/PLATELET
Abs Immature Granulocytes: 0.03 10*3/uL (ref 0.00–0.07)
Basophils Absolute: 0 10*3/uL (ref 0.0–0.1)
Basophils Relative: 0 %
Eosinophils Absolute: 0.2 10*3/uL (ref 0.0–0.5)
Eosinophils Relative: 3 %
HCT: 41.8 % (ref 36.0–46.0)
Hemoglobin: 13.2 g/dL (ref 12.0–15.0)
Immature Granulocytes: 1 %
Lymphocytes Relative: 32 %
Lymphs Abs: 1.7 10*3/uL (ref 0.7–4.0)
MCH: 26.1 pg (ref 26.0–34.0)
MCHC: 31.6 g/dL (ref 30.0–36.0)
MCV: 82.8 fL (ref 80.0–100.0)
Monocytes Absolute: 0.5 10*3/uL (ref 0.1–1.0)
Monocytes Relative: 8 %
Neutro Abs: 3 10*3/uL (ref 1.7–7.7)
Neutrophils Relative %: 56 %
Platelets: 264 10*3/uL (ref 150–400)
RBC: 5.05 MIL/uL (ref 3.87–5.11)
RDW: 14 % (ref 11.5–15.5)
WBC: 5.4 10*3/uL (ref 4.0–10.5)
nRBC: 0 % (ref 0.0–0.2)

## 2022-06-23 LAB — TROPONIN I (HIGH SENSITIVITY): Troponin I (High Sensitivity): 5 ng/L (ref <18)

## 2022-06-23 NOTE — Progress Notes (Signed)
Adult Psychoeducational Group Note  Date:  06/23/2022 Time:  8:29 PM  Group Topic/Focus:  Wrap-Up Group:   The focus of this group is to help patients review their daily goal of treatment and discuss progress on daily workbooks.  Participation Level:  Active  Participation Quality:  Drowsy  Affect:  Appropriate  Cognitive:  Appropriate  Insight: Appropriate  Engagement in Group:  Limited  Modes of Intervention:  Discussion  Additional Comments:    Pt stated her goal for today was to focus on her treatment plan. Pt stated she accomplished her goal today. Pt stated she talked with her doctor and with her social worker about her care today. Pt rated her overall day a 4 out of 10. Pt stated she made no calls today. Pt stated she felt better about herself tonight. Pt stated she was able to attend all meals today. Pt stated she took all medications provided today. Pt stated her appetite was fair today. Pt rated her sleep last night was fair. Pt stated the goal tonight was to get some rest. Pt stated she had no physical pain tonight. Pt deny visual hallucinations and auditory issues tonight. Pt denies thoughts of harming herself or others. Pt stated she would alert staff if anything changed.  Cheryl Adams 06/23/2022, 8:29 PM

## 2022-06-23 NOTE — Progress Notes (Signed)
   06/23/22 2130  Psych Admission Type (Psych Patients Only)  Admission Status Voluntary  Psychosocial Assessment  Patient Complaints Suspiciousness  Eye Contact Avoids  Facial Expression Flat  Affect Appropriate to circumstance  Speech Slow;Soft  Interaction Childlike  Motor Activity Slow  Appearance/Hygiene Disheveled  Behavior Characteristics Cooperative  Mood Preoccupied  Aggressive Behavior  Effect No apparent injury  Thought Process  Coherency Circumstantial;Disorganized  Content Preoccupation  Delusions Paranoid  Perception WDL  Hallucination None reported or observed  Judgment Poor  Confusion None  Danger to Self  Current suicidal ideation? Denies  Danger to Others  Danger to Others None reported or observed

## 2022-06-23 NOTE — Progress Notes (Signed)
   06/23/22 1600  Psych Admission Type (Psych Patients Only)  Admission Status Voluntary  Psychosocial Assessment  Patient Complaints Suspiciousness  Eye Contact Avoids  Facial Expression Flat  Affect Appropriate to circumstance  Speech Soft  Interaction Childlike  Motor Activity Slow  Appearance/Hygiene Unremarkable  Behavior Characteristics Cooperative  Mood Suspicious  Thought Process  Coherency Disorganized  Content Paranoia  Delusions Paranoid  Perception WDL  Hallucination None reported or observed  Judgment Poor  Confusion None  Danger to Self  Current suicidal ideation? Denies  Danger to Others  Danger to Others None reported or observed

## 2022-06-23 NOTE — Group Note (Signed)
Recreation Therapy Group Note   Group Topic:Stress Management  Group Date: 06/23/2022 Start Time: 1000 End Time: 1055 Facilitators: Caroll Rancher, LRT,CTRS Location: 500 Hall Dayroom   Goal Area(s) Addresses:  Patient will identify positive stress management techniques. Patient will identify benefits of using stress management post d/c.  Group Description:  Stress Exploration.  LRT and patients discussed the causes of stress and how to deal with them.  Patients gave examples of things that can cause a person to stress out such as work, bills, family, etc.  Patients were then given a worksheet that was divided into three areas daily hassles, major life changes and life circumstances.  Patients were to identify four stresses for each section.  On the back of the sheet, patients were to identify things that can help them deal with the stressor in each section listed above   Affect/Mood: Flat   Participation Level: Minimal   Participation Quality: Independent   Behavior: Drowsy   Speech/Thought Process: Unfocused   Insight: None   Judgement: None   Modes of Intervention: Worksheet   Patient Response to Interventions:  Sleep   Education Outcome:  Acknowledges education and In group clarification offered    Clinical Observations/Individualized Feedback:  Pt was drowsy and going in/out of sleep the whole group.  Pt was awake enough to fill out some of the worksheet.  Pt slept through remainder of group.   Plan: Continue to engage patient in RT group sessions 2-3x/week.   Caroll Rancher, Antonietta Jewel  06/23/2022 12:49 PM
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
## 2022-06-23 NOTE — Progress Notes (Signed)
   06/23/22 0530  Sleep  Number of Hours 7.5

## 2022-06-24 DIAGNOSIS — F251 Schizoaffective disorder, depressive type: Principal | ICD-10-CM

## 2022-06-24 MED ORDER — CLOZAPINE 25 MG PO TABS
237.5000 mg | ORAL_TABLET | Freq: Every day | ORAL | Status: AC
Start: 2022-06-26 — End: 2022-06-26
  Administered 2022-06-26: 237.5 mg via ORAL
  Filled 2022-06-24: qty 2

## 2022-06-24 MED ORDER — CLOZAPINE 100 MG PO TABS
225.0000 mg | ORAL_TABLET | Freq: Every day | ORAL | Status: AC
Start: 2022-06-25 — End: 2022-06-25
  Administered 2022-06-25: 225 mg via ORAL
  Filled 2022-06-24: qty 1

## 2022-06-24 MED ORDER — CLOZAPINE 25 MG PO TABS
212.5000 mg | ORAL_TABLET | Freq: Every day | ORAL | Status: AC
Start: 2022-06-24 — End: 2022-06-24
  Administered 2022-06-24: 212.5 mg via ORAL
  Filled 2022-06-24: qty 1

## 2022-06-24 MED ORDER — CLOZAPINE 100 MG PO TABS
275.0000 mg | ORAL_TABLET | Freq: Every day | ORAL | Status: AC
Start: 2022-06-29 — End: 2022-06-29
  Administered 2022-06-29: 275 mg via ORAL
  Filled 2022-06-24: qty 3

## 2022-06-24 MED ORDER — METHYLPHENIDATE HCL 10 MG PO TABS: 5.0000 mg | ORAL_TABLET | Freq: Every day | ORAL | Status: DC

## 2022-06-24 MED ORDER — CLOZAPINE 25 MG PO TABS
262.5000 mg | ORAL_TABLET | Freq: Every day | ORAL | Status: AC
Start: 2022-06-28 — End: 2022-06-28
  Administered 2022-06-28: 262.5 mg via ORAL
  Filled 2022-06-24: qty 3

## 2022-06-24 MED ORDER — CLOZAPINE 25 MG PO TABS
250.0000 mg | ORAL_TABLET | Freq: Every day | ORAL | Status: AC
  Administered 2022-06-27: 250 mg via ORAL
  Filled 2022-06-24: qty 2

## 2022-06-24 NOTE — Group Note (Signed)
Recreation Therapy Group Note   Group Topic:Healthy Decision Making  Group Date: 06/24/2022 Start Time: 1000 End Time: 1050 Facilitators: Caroll Rancher, LRT,CTRS Location: 500 Hall Dayroom   Goal Area(s) Addresses:  Patient will effectively work with peer towards shared goal.  Patient will identify factors that guided their decision making.  Patient will pro-socially communicate ideas during group session.    Group Description:  Patients were given a scenario that they were going to be stranded on a deserted [place] for several months before being rescued. Writer tasked them with making a list of 15 things they would choose to bring with them for "survival". The list of items was prioritized most important to least. Each patient would come up with their own list, then work together to create a new list of 15 items while in a group of 3-5 peers. LRT discussed each person's list and how it differed from others. The debrief included discussion of priorities, good decisions versus bad decisions, and how it is important to think before acting so we can make the best decision possible. LRT tied the concept of effective communication among group members to patient's support systems outside of the hospital and its benefit post discharge.   Affect/Mood: Drowsy   Participation Level: Active   Participation Quality: Independent   Behavior: Appropriate   Speech/Thought Process: Barely audible    Insight: Moderate   Judgement: Moderate   Modes of Intervention: Group work   Patient Response to Interventions:  Attentive   Education Outcome:  Acknowledges education and In group clarification offered    Clinical Observations/Individualized Feedback: Pt was drowsy and dosed off a few times during group session.  Pt identified taking a boat, sanitary wipes, matches, flashlight, machete and bottled water with her on a deserted [place].  Pt worked well with peers in compiling their list.      Plan: Continue to engage patient in RT group sessions 2-3x/week.   Caroll Rancher, LRT,CTRS  06/24/2022 1:24 PM

## 2022-06-24 NOTE — Progress Notes (Signed)
   06/24/22 2300  Psych Admission Type (Psych Patients Only)  Admission Status Voluntary  Psychosocial Assessment  Patient Complaints Suspiciousness  Eye Contact Avoids  Facial Expression Flat  Affect Appropriate to circumstance  Speech Slow;Soft  Interaction Childlike  Motor Activity Slow  Appearance/Hygiene Disheveled  Behavior Characteristics Cooperative  Mood Preoccupied  Aggressive Behavior  Effect No apparent injury  Thought Process  Coherency Circumstantial;Disorganized  Content Preoccupation  Delusions Paranoid  Perception WDL  Hallucination None reported or observed  Judgment Poor  Confusion None  Danger to Self  Current suicidal ideation? Denies  Danger to Others  Danger to Others None reported or observed

## 2022-06-24 NOTE — Group Note (Signed)
Topic: Self Esteem  Due to the acuity and complex discharge plans, group was not held. Patient was provided therapeutic worksheets and asked to meet with CSW as needed.    Emalene Welte, LCSW, LCAS Clincal Social Worker  [facility]  

## 2022-06-24 NOTE — Progress Notes (Signed)
[facility] Group Notes:  (Nursing/MHT/Case Management/Adjunct)  Date:  06/24/2022  Time:  2000  Type of Therapy:   Wrap up group  Participation Level:  Active  Participation Quality:  Appropriate, Attentive, and Sharing  Affect:  Anxious  Cognitive:  Alert  Insight:  Improving  Engagement in Group:  Engaged  Modes of Intervention:  Clarification, Education, and Support  Summary of Progress/Problems: Positive thinking and positive change were discussed.   Marcille Buffy 06/24/2022, 8:44 PM

## 2022-06-24 NOTE — Progress Notes (Signed)
   06/24/22 1200  Psych Admission Type (Psych Patients Only)  Admission Status Voluntary  Psychosocial Assessment  Patient Complaints Suspiciousness  Eye Contact Avoids  Facial Expression Flat  Affect Appropriate to circumstance  Speech Soft  Interaction Childlike  Motor Activity Slow  Appearance/Hygiene Disheveled  Behavior Characteristics Cooperative  Mood Preoccupied  Aggressive Behavior  Effect No apparent injury  Thought Process  Coherency Circumstantial;Disorganized  Content Preoccupation  Delusions Paranoid  Perception WDL  Hallucination None reported or observed  Judgment Poor  Confusion None  Danger to Self  Current suicidal ideation? Denies  Danger to Others  Danger to Others None reported or observed

## 2022-06-24 NOTE — Progress Notes (Signed)
   06/24/22 0615  Sleep  Number of Hours 8.5
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
## 2022-06-25 DIAGNOSIS — F251 Schizoaffective disorder, depressive type: Secondary | ICD-10-CM | POA: Diagnosis not present

## 2022-06-25 MED ORDER — METHYLPHENIDATE HCL 10 MG PO TABS
5.0000 mg | ORAL_TABLET | Freq: Every day | ORAL | Status: DC
Start: 2022-06-25 — End: 2022-06-26
  Administered 2022-06-25: 5 mg via ORAL
  Filled 2022-06-25 (×2): qty 1

## 2022-06-25 NOTE — Progress Notes (Signed)
[facility] Group Notes:  (Nursing/MHT/Case Management/Adjunct)  Date:  06/25/2022  Time:  2000  Type of Therapy:   wrap up group  Participation Level:  Active  Participation Quality:  Appropriate, Attentive, and Sharing  Affect:  Not Congruent  Cognitive:  Appropriate  Insight:  Improving  Engagement in Group:  Engaged  Modes of Intervention:  Clarification, Education, and Socialization  Summary of Progress/Problems: Positive thinking and self-care were discussed. Pt gave no eye contact during group time to peer patients or this Clinical research associate until called upon. Then, answered prompts appropriately, with eye contact, and even smiled.   Marcille Buffy 06/25/2022, 8:31 PM

## 2022-06-25 NOTE — Group Note (Signed)
Recreation Therapy Group Note   Group Topic:Team Building  Group Date: 06/25/2022 Start Time: 1000 End Time: 1045 Facilitators: Caroll Rancher, LRT,CTRS Location: [address] Dayroom   Goal Area(s) Addresses:  Patient will effectively work with peer towards shared goal.  Patient will identify skills used to make activity successful.  Patient will share challenges and verbalize solution-driven approaches used. Patient will identify how skills used during activity can be used to reach post d/c goals.   Group Description: [employer]. Patients were provided the following materials: 5 drinking straws, 5 rubber bands, 5 paper clips, 2 index cards, 2 drinking cups, and 2 toilet paper rolls. Using the provided materials patients were asked to build a launching mechanism to launch a ping pong ball across the room, approximately 10 feet. Patients were divided into teams of 3-5. Instructions required all materials be incorporated into the device, functionality of items left to the peer group's discretion.   Affect/Mood: Appropriate   Participation Level: Engaged   Participation Quality: Independent   Behavior: Appropriate   Speech/Thought Process: Focused   Insight: Good   Judgement: Good   Modes of Intervention: Team-building   Patient Response to Interventions:  Engaged   Education Outcome:  Acknowledges education and In group clarification offered    Clinical Observations/Individualized Feedback: Pt was quiet but engaged.  Pt helped come up with ideas and was attentive to the ideas of her partner.  Pt was able to stay attentive/awake during group as well.    Plan: Continue to engage patient in RT group sessions 2-3x/week.   Caroll Rancher, LRT,CTRS 06/25/2022 11:48 AM
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
## 2022-06-25 NOTE — Progress Notes (Signed)
   06/25/22 2230  Psych Admission Type (Psych Patients Only)  Admission Status Voluntary  Psychosocial Assessment  Patient Complaints Suspiciousness  Eye Contact Avoids  Facial Expression Flat  Affect Appropriate to circumstance  Speech Slow;Soft  Interaction Childlike  Motor Activity Slow  Appearance/Hygiene Disheveled  Behavior Characteristics Cooperative  Mood Suspicious  Aggressive Behavior  Effect No apparent injury  Thought Process  Coherency Circumstantial;Disorganized  Content Preoccupation  Delusions Paranoid  Perception WDL  Hallucination None reported or observed  Judgment Poor  Confusion None  Danger to Self  Current suicidal ideation? Denies  Danger to Others  Danger to Others None reported or observed

## 2022-06-25 NOTE — [facility] Group Notes (Addendum)
Patient attended Mental Health Peer Support group

## 2022-06-25 NOTE — [facility] Group Notes (Signed)
Adult Psychoeducational Group Note  Date:  06/25/2022 Time:  2:07 PM  Group Topic/Focus:  Goals Group:   The focus of this group is to help patients establish daily goals to achieve during treatment and discuss how the patient can incorporate goal setting into their daily lives to aide in recovery.  Participation Level:  Active  Participation Quality:  Appropriate  Affect:  Appropriate  Cognitive:  Appropriate  Insight: Appropriate  Engagement in Group:  Engaged  Modes of Intervention:  Discussion  Additional Comments:  Patient attended morning orientation group and participated.Cheryl Adams 06/25/2022, 2:07 PM

## 2022-06-25 NOTE — Progress Notes (Signed)
   06/25/22 0515  Sleep  Number of Hours 7.5    

## 2022-06-25 NOTE — Group Note (Signed)
Occupational Therapy Group Note  Group Topic:Stress Management  Group Date: 06/25/2022 Start Time: 1400 End Time: 1500 Facilitators: Ted Mcalpine, OT   Group Description: Group encouraged increased participation and engagement through discussion focused on topic of stress management. Patients engaged interactively to discuss components of stress including physical signs, emotional signs, negative management strategies, and positive management strategies. Each individual identified one new stress management strategy they would like to try moving forward.    Therapeutic Goals: Identify current stressors Identify healthy vs unhealthy stress management strategies/techniques Discuss and identify physical and emotional signs of stress   Participation Level: Minimal   Participation Quality: Independent   Behavior: Alert and Appropriate   Speech/Thought Process: Barely audible   Affect/Mood: Flat   Insight: Limited   Judgement: Limited   Individualization: pt was passively engaged in their participation of group discussion/activity. New skills were identified  Modes of Intervention: Discussion and Education  Patient Response to Interventions:  Attentive   Plan: Continue to engage patient in OT groups 2 - 3x/week.  06/25/2022  Ted Mcalpine, OT  Kerrin Champagne, OT

## 2022-06-25 NOTE — Progress Notes (Signed)
   06/25/22 1300  Psych Admission Type (Psych Patients Only)  Admission Status Voluntary  Psychosocial Assessment  Patient Complaints Suspiciousness  Eye Contact Avoids  Facial Expression Flat  Affect Appropriate to circumstance  Speech Soft  Interaction Childlike  Motor Activity Slow  Appearance/Hygiene Unremarkable  Behavior Characteristics Cooperative  Mood Preoccupied  Thought Process  Coherency Circumstantial  Content Preoccupation  Delusions Paranoid  Perception WDL  Hallucination None reported or observed  Judgment Poor  Confusion None  Danger to Self  Current suicidal ideation? Denies  Danger to Others  Danger to Others None reported or observed

## 2022-06-26 ENCOUNTER — Encounter (HOSPITAL_COMMUNITY): Payer: Self-pay

## 2022-06-26 DIAGNOSIS — F251 Schizoaffective disorder, depressive type: Secondary | ICD-10-CM | POA: Diagnosis not present

## 2022-06-26 MED ORDER — METHYLPHENIDATE HCL 10 MG PO TABS
5.0000 mg | ORAL_TABLET | Freq: Every day | ORAL | Status: DC
  Administered 2022-06-26 – 2022-07-09 (×14): 5 mg via ORAL
  Filled 2022-06-26 (×14): qty 1

## 2022-06-26 MED ORDER — METHYLPHENIDATE HCL 5 MG PO TABS: 2.5000 mg | ORAL_TABLET | Freq: Every day | ORAL | Status: DC

## 2022-06-26 MED ORDER — RISPERIDONE 0.5 MG PO TBDP
0.2500 mg | ORAL_TABLET | Freq: Two times a day (BID) | ORAL | Status: DC
  Filled 2022-06-26 (×3): qty 0.5

## 2022-06-26 NOTE — Group Note (Signed)
LCSW Group Therapy Note   Group Date: 06/26/2022 Start Time: 1300 End Time: 1400   Type of Therapy and Topic:  Group Therapy: Production assistant, radio  Participation Level:  Active  Description of Group: Pts will define what a safe space looks like to them and be able to describe sensations and feelings associated with that space.  Patients will be able to participate in imagery exercise.  Patients then provided opportunity to describe unique safe spaces.   Patients discussed when they can use this coping skill to assist with feelings and situations after discharge.    Therapeutic Goals:  DBT Meditations and Grounding  Solution Based Therapy     Summary of Patient Progress:    Patient participated appropriately in group.  Patient discussed a [place] and eating icecream at the ice cream shop.  Patient also identified the group room as a safe space while she is in the hospital.  Patient participated in group activity and had good insight into group topic.   Therapeutic Modalities:   Beatris Si, LCSW 06/26/2022  2:00 PM
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
## 2022-06-26 NOTE — BHH Group Notes (Signed)
Adult Psychoeducational Group Note  Date:  06/26/2022 Time:  5:16 PM  Group Topic/Focus:  Goals Group:   The focus of this group is to help patients establish daily goals to achieve during treatment and discuss how the patient can incorporate goal setting into their daily lives to aide in recovery.  Participation Level:  Active  Participation Quality:  Appropriate  Affect:  Appropriate  Cognitive:  Appropriate  Insight: Appropriate  Engagement in Group:  Engaged  Modes of Intervention:  Discussion  Additional Comments:  Patient attended morning orientation group and participated.   Ariana Cavenaugh W Natasja Niday 06/26/2022, 5:16 PM

## 2022-06-26 NOTE — Progress Notes (Signed)
Adult Psychoeducational Group Note  Date:  06/26/2022 Time:  9:08 PM  Group Topic/Focus:  Wrap-Up Group:   The focus of this group is to help patients review their daily goal of treatment and discuss progress on daily workbooks.  Participation Level:  Active  Participation Quality:  Appropriate  Affect:  Appropriate  Cognitive:  Appropriate  Insight: Appropriate  Engagement in Group:  Engaged  Modes of Intervention:  Discussion  Additional Comments:  Pt stated she had an okay day.  Pt stated her goal for the day was to meditate.  Pt met goal.  Elise Benne 06/26/2022, 9:08 PM

## 2022-06-26 NOTE — Group Note (Signed)
Recreation Therapy Group Note   Group Topic:Communication  Group Date: 06/26/2022 Start Time: 1000 End Time: 1045 Facilitators: Caroll Rancher, LRT,CTRS Location: [address] Dayroom   Goal Area(s) Addresses:  Patient will effectively listen to complete activity.  Patient will identify communication skills used to make activity successful.  Patient will identify how skills used during activity can be used to reach post d/c goals.    Group Description:  Geometric Drawings.  Three volunteers from the peer group will be shown an abstract picture with a particular arrangement of geometrical shapes.  Each round, one 'speaker' will describe the pattern, as accurately as possible without revealing the image to the group.  The remaining group members will listen and draw the picture to reflect how it is described to them. Patients with the role of 'listener' cannot ask clarifying questions but, may request that the speaker repeat a direction. Once the drawings are complete, the presenter will show the rest of the group the picture and compare how close each person came to drawing the picture. LRT will facilitate a post-activity discussion regarding effective communication and the importance of planning, listening, and asking for clarification in daily interactions with others.   Affect/Mood: Appropriate   Participation Level: Engaged   Participation Quality: Independent   Behavior: Appropriate   Speech/Thought Process: Focused   Insight: Good   Judgement: Good   Modes of Intervention: Activity   Patient Response to Interventions:  Engaged   Education Outcome:  Acknowledges education and In group clarification offered    Clinical Observations/Individualized Feedback: Pt wasn't showing any signs of being drowsy or sleepy during group.  Pt explained some methods of communication are writing and music.  Pt was one of the volunteers for the activity.  Pt did good but seemed a little nervous  and would stumble over her words.  Pt seemed focused on making sure she was being detailed in describing the picture she was given.     Plan: Continue to engage patient in RT group sessions 2-3x/week.   Caroll Rancher, LRT,CTRS 06/26/2022 12:15 PM

## 2022-06-26 NOTE — Progress Notes (Signed)
Pt continues to be paranoid about going in her room , needing re assurance and checking her room before going in there

## 2022-06-26 NOTE — Progress Notes (Signed)
   06/26/22 0515  Sleep  Number of Hours 7.75

## 2022-06-26 NOTE — BH IP Treatment Plan (Signed)
Interdisciplinary Treatment and Diagnostic Plan Update  06/26/2022 Time of Session: 9:50am  Cheryl Adams MRN: [id number]  Principal Diagnosis: Schizoaffective disorder, depressive type (HCC)  Secondary Diagnoses: Principal Problem:   Schizoaffective disorder, depressive type (HCC) Active Problems:   Insomnia   GAD (generalized anxiety disorder)   Vitamin D deficiency   Current Medications:  Current Facility-Administered Medications  Medication Dose Route Frequency Provider Last Rate Last Admin   acetaminophen (TYLENOL) tablet 500 mg  500 mg Oral Q6H PRN Massengill, Harrold Donath, MD       amLODipine (NORVASC) tablet 10 mg  10 mg Oral Daily Massengill, Harrold Donath, MD   10 mg at 06/26/22 0947   antiseptic oral rinse (BIOTENE) solution 15 mL  15 mL Mouth Rinse 5 X Daily PRN Comer Locket, MD       atenolol (TENORMIN) tablet 25 mg  25 mg Oral Daily Massengill, Harrold Donath, MD   25 mg at 06/26/22 0947   cloZAPine (CLOZARIL) tablet 237.5 mg  237.5 mg Oral QHS Massengill, Harrold Donath, MD       Followed by   [name] ON 06/27/2022] cloZAPine (CLOZARIL) tablet 250 mg  250 mg Oral QHS Massengill, Harrold Donath, MD       Followed by   [name] ON 06/28/2022] cloZAPine (CLOZARIL) tablet 262.5 mg  262.5 mg Oral QHS Massengill, Harrold Donath, MD       Followed by   [name] ON 06/29/2022] cloZAPine (CLOZARIL) tablet 275 mg  275 mg Oral QHS Massengill, Nathan, MD       docusate sodium (COLACE) capsule 100 mg  100 mg Oral Daily PRN Mason Jim, Amy E, MD       ferrous sulfate tablet 325 mg  325 mg Oral Daily Princess Bruins, DO   325 mg at 06/26/22 4098   hydrOXYzine (ATARAX) tablet 25 mg  25 mg Oral TID PRN Phineas Inches, MD   25 mg at 06/17/22 2027   OLANZapine zydis (ZYPREXA) disintegrating tablet 5 mg  5 mg Oral Q8H PRN Massengill, Harrold Donath, MD   5 mg at 05/24/22 2154   And   LORazepam (ATIVAN) tablet 1 mg  1 mg Oral Q8H PRN Massengill, Harrold Donath, MD       And   ziprasidone (GEODON) injection 20 mg  20 mg Intramuscular Q8H PRN  Massengill, Harrold Donath, MD       metFORMIN (GLUCOPHAGE-XR) 24 hr tablet 500 mg  500 mg Oral Q breakfast Massengill, Nathan, MD   500 mg at 06/26/22 0947   risperiDONE (RISPERDAL M-TABS) disintegrating tablet 0.25 mg  0.25 mg Oral Q12H Massengill, Nathan, MD       sertraline (ZOLOFT) tablet 100 mg  100 mg Oral Daily Massengill, Harrold Donath, MD   100 mg at 06/26/22 1191   Vitamin D (Ergocalciferol) (DRISDOL) capsule 50,000 Units  50,000 Units Oral Q7 days Starleen Blue, NP   50,000 Units at 06/22/22 4782   PTA Medications: Medications Prior to Admission  Medication Sig Dispense Refill Last Dose   ferrous sulfate 325 (65 FE) MG tablet Take 325 mg by mouth daily.      risperiDONE (RISPERDAL) 1 MG tablet Take 1 tablet (1 mg total) by mouth daily.      risperiDONE (RISPERDAL) 2 MG tablet Take 1 tablet (2 mg total) by mouth at bedtime.       Patient Stressors: Health problems   Marital or family conflict   Medication change or noncompliance    Patient Strengths: Average or above average intelligence  Supportive family/friends   Treatment Modalities: Medication  Management, Group therapy, Case management,  1 to 1 session with clinician, Psychoeducation, Recreational therapy.   Physician Treatment Plan for Primary Diagnosis: Schizoaffective disorder, depressive type (HCC) Long Term Goal(s): Improvement in symptoms so as ready for discharge   Short Term Goals: Ability to verbalize feelings will improve Ability to disclose and discuss suicidal ideas Ability to identify and develop effective coping behaviors will improve Compliance with prescribed medications will improve  Medication Management: Evaluate patient's response, side effects, and tolerance of medication regimen.  Therapeutic Interventions: 1 to 1 sessions, Unit Group sessions and Medication administration.  Evaluation of Outcomes: Progressing  Physician Treatment Plan for Secondary Diagnosis: Principal Problem:   Schizoaffective  disorder, depressive type (HCC) Active Problems:   Insomnia   GAD (generalized anxiety disorder)   Vitamin D deficiency  Long Term Goal(s): Improvement in symptoms so as ready for discharge   Short Term Goals: Ability to verbalize feelings will improve Ability to disclose and discuss suicidal ideas Ability to identify and develop effective coping behaviors will improve Compliance with prescribed medications will improve     Medication Management: Evaluate patient's response, side effects, and tolerance of medication regimen.  Therapeutic Interventions: 1 to 1 sessions, Unit Group sessions and Medication administration.  Evaluation of Outcomes: Progressing   RN Treatment Plan for Primary Diagnosis: Schizoaffective disorder, depressive type (HCC) Long Term Goal(s): Knowledge of disease and therapeutic regimen to maintain health will improve  Short Term Goals: Ability to remain free from injury will improve, Ability to participate in decision making will improve, Ability to verbalize feelings will improve, Ability to disclose and discuss suicidal ideas, and Ability to identify and develop effective coping behaviors will improve  Medication Management: RN will administer medications as ordered by provider, will assess and evaluate patient's response and provide education to patient for prescribed medication. RN will report any adverse and/or side effects to prescribing provider.  Therapeutic Interventions: 1 on 1 counseling sessions, Psychoeducation, Medication administration, Evaluate responses to treatment, Monitor vital signs and CBGs as ordered, Perform/monitor CIWA, COWS, AIMS and Fall Risk screenings as ordered, Perform wound care treatments as ordered.  Evaluation of Outcomes: Progressing   LCSW Treatment Plan for Primary Diagnosis: Schizoaffective disorder, depressive type (HCC) Long Term Goal(s): Safe transition to appropriate next level of care at discharge, Engage patient in  therapeutic group addressing interpersonal concerns.  Short Term Goals: Engage patient in aftercare planning with referrals and resources, Increase social support, Increase emotional regulation, Facilitate acceptance of mental health diagnosis and concerns, Identify triggers associated with mental health/substance abuse issues, and Increase skills for wellness and recovery  Therapeutic Interventions: Assess for all discharge needs, 1 to 1 time with Social worker, Explore available resources and support systems, Assess for adequacy in community support network, Educate family and significant other(s) on suicide prevention, Complete Psychosocial Assessment, Interpersonal group therapy.  Evaluation of Outcomes: Progressing   Progress in Treatment: Attending groups: Yes. Participating in groups: No. Taking medication as prescribed: Yes. Toleration medication: Yes. Family/Significant other contact made: Yes, individual(s) contacted:  mother, Cheryl Adams  Patient understands diagnosis: No. Discussing patient identified problems/goals with staff: Yes. Medical problems stabilized or resolved: Yes. Denies suicidal/homicidal ideation: Yes. Issues/concerns per patient self-inventory: No.   New problem(s) identified: No, Describe:  none reported    New Short Term/Long Term Goal(s):   medication stabilization, elimination of SI thoughts, development of comprehensive mental wellness plan.      Patient Goals:  No additional goals identified at this time. Patient to continue to  work towards original goals identified in initial treatment team meeting. CSW will remain available to patient should they voice additional treatment goals.    Discharge Plan or Barriers:  No psychosocial barriers identified at this time. Physicians continue to adjust medications to assist with paranoid thoughts.  Patient currently has paranoia around her main support system, parents.  Patient will be discharged to residence  upon discharge.  Patient is connected to med management and therapy upon discharge.    Reason for Continuation of Hospitalization: Delusions  Depression Medication stabilization   Estimated Length of Stay: 5-7 days   Last 3 [place] Suicide Severity Risk Score: [place] Admission (Current) from 05/19/2022 in [facility] ED from 05/18/2022 in [facility] Office Visit from 04/14/2022 in [place] No Risk High Risk No Risk       Last PHQ 2/9 Scores:    04/14/2022   10:46 AM 02/24/2022   11:12 AM 01/13/2022    9:14 AM  Depression screen PHQ 2/9  Decreased Interest 0 0 0  Down, Depressed, Hopeless 0 0 0  PHQ - 2 Score 0 0 0    Scribe for Treatment Team: Darleen Crocker, Latanya Presser 06/26/2022 11:17 AM

## 2022-06-26 NOTE — Progress Notes (Signed)
   06/26/22 2000  Psych Admission Type (Psych Patients Only)  Admission Status Voluntary  Psychosocial Assessment  Patient Complaints Suspiciousness  Eye Contact Avoids  Facial Expression Flat  Affect Appropriate to circumstance  Speech Slow;Soft  Interaction Childlike  Motor Activity Slow  Appearance/Hygiene Disheveled  Behavior Characteristics Cooperative  Mood Preoccupied  Aggressive Behavior  Effect No apparent injury  Thought Process  Coherency Circumstantial;Disorganized  Content Preoccupation  Delusions Paranoid  Perception WDL  Hallucination None reported or observed  Judgment Poor  Confusion None  Danger to Self  Current suicidal ideation? Denies  Danger to Others  Danger to Others None reported or observed

## 2022-06-26 NOTE — [facility] Group Notes (Signed)
  Spirituality group facilitated by Kathleen Argue, [facility].   Group Description: Group focused on topic of hope. Patients participated in facilitated discussion around topic, connecting with one another around experiences and definitions for hope. Group members engaged with visual explorer photos, reflecting on what hope looks like for them today. Group engaged in discussion around how their definitions of hope are present today in hospital.   Modalities: Psycho-social ed, Adlerian, Narrative, MI   Patient Progress: Judithann attended group and though she was very tired, she participated in the conversation when called on.  Her comments were on topic and appropriate.  [address] [facility] Pager, [phone]

## 2022-06-27 DIAGNOSIS — F251 Schizoaffective disorder, depressive type: Secondary | ICD-10-CM | POA: Diagnosis not present

## 2022-06-27 NOTE — Group Note (Signed)
LCSW Group Therapy Note  06/27/2022     Type of Therapy and Topic:  Group Therapy: Anger and Coping Skills  Participation Level:  Minimal   Description of Group:   In this group, patients learned how to recognize the physical, cognitive, emotional, and behavioral responses they have to anger-provoking situations.  They identified how they usually or often react when angered, and learned how healthy and unhealthy coping skills work initially, but the unhealthy ones stop working.   They analyzed how their frequently-chosen coping skill is possibly beneficial and how it is possibly unhelpful.  The group discussed a variety of healthier coping skills that could help in resolving the actual issues, as well as how to go about planning for the the possibility of future similar situations.  Therapeutic Goals: Patients will identify one thing that makes them angry and how they feel emotionally and physically, what their thoughts are or tend to be in those situations, and what healthy or unhealthy coping mechanism they typically use Patients will identify how their coping technique works for them, as well as how it works against them. Patients will explore possible new behaviors to use in future anger situations. Patients will learn that anger itself is normal and cannot be eliminated, and that healthier coping skills can assist with resolving conflict rather than worsening situations.  Summary of Patient Progress:  The patient shared that she often is angered by people saying negative things about her and chooses to cope with these feelings by ignoring them or moving away from the situation.  The patient was essentially silent for the remainder of group.   Therapeutic Modalities:   Cognitive Behavioral Therapy   Lynnell Chad  .

## 2022-06-27 NOTE — Progress Notes (Signed)
   06/27/22 0500  Sleep  Number of Hours 7.75

## 2022-06-27 NOTE — Progress Notes (Signed)
   06/27/22 2100  Psych Admission Type (Psych Patients Only)  Admission Status Voluntary  Psychosocial Assessment  Patient Complaints None  Eye Contact Avoids  Facial Expression Flat  Affect Appropriate to circumstance  Speech Slow;Soft  Interaction Childlike  Motor Activity Slow  Appearance/Hygiene Disheveled  Behavior Characteristics Cooperative  Mood Preoccupied  Aggressive Behavior  Effect No apparent injury  Thought Process  Coherency Circumstantial;Disorganized  Content Preoccupation  Delusions Paranoid  Perception WDL  Hallucination None reported or observed  Judgment Poor  Confusion None  Danger to Self  Current suicidal ideation? Denies  Danger to Others  Danger to Others None reported or observed

## 2022-06-27 NOTE — [facility] Group Notes (Signed)
Pt attended group and rated day 4/10
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
## 2022-06-28 DIAGNOSIS — F251 Schizoaffective disorder, depressive type: Secondary | ICD-10-CM | POA: Diagnosis not present

## 2022-06-28 NOTE — Progress Notes (Signed)
Pt visible in milieu majority of this shift, attended scheduled groups. Continues with fixed smile and avertive eye contact on interactions. Speech is logical, soft but she remains guarded and minimal but pleasant. Reports she slept well with good appetite. Showered, changed clothes with staff assistance. Denies SI, HI, AVH and pain this shift. Rates her anxiety, hopelessness and depression all 4/10 with stressor being "I want to go outside today. I'm still here, I thought I will be out of here by now". Safety checks continues at Q 15 minutes intervals without incident. Emotional support, reassurance and encouragement provided. Pt tolerates all PO intake and medications without discomfort.

## 2022-06-28 NOTE — Group Note (Signed)
[facility] LCSW Group Therapy Note  Date/Time:  06/28/2022  11:00AM-12:00PM  Type of Therapy and Topic:  Group Therapy:  Music and Mood  Participation Level:  Minimal   Description of Group: In this process group, members listened to a variety of genres of music and identified that different types of music evoke different responses.  Patients were encouraged to identify music that was soothing for them and music that was energizing for them.  Patients discussed how this knowledge can help with wellness and recovery in various ways including managing depression and anxiety as well as encouraging healthy sleep habits.    Therapeutic Goals: Patients will explore the impact of different varieties of music on mood Patients will verbalize the thoughts they have when listening to different types of music Patients will identify music that is soothing to them as well as music that is energizing to them Patients will discuss how to use this knowledge to assist in maintaining wellness and recovery Patients will explore the use of music as a coping skill  Summary of Patient Progress:  At the beginning of group, patient expressed her mood was "relaxed."  She did not react to any of the music, kept looking at the floor, and did not comment on any of the songs.  At the end of group, patient expressed her mood was still "relaxed."    Therapeutic Modalities: Solution Focused Brief Therapy Activity   Ambrose Mantle, LCSW

## 2022-06-28 NOTE — Plan of Care (Signed)
  Problem: Education: Goal: Emotional status will improve Outcome: Progressing   Problem: Activity: Goal: Interest or engagement in activities will improve Outcome: Progressing   Problem: Health Behavior/Discharge Planning: Goal: Compliance with treatment plan for underlying cause of condition will improve Outcome: Progressing
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
## 2022-06-28 NOTE — Progress Notes (Signed)
[facility] Group Notes:  (Nursing/MHT/Case Management/Adjunct)  Date:  06/28/2022  Time:  2000 Type of Therapy:   wrap up group  Participation Level:  Active  Participation Quality:  Appropriate, Attentive, Sharing, and Supportive  Affect:  Resistant  Cognitive:  Alert  Insight:  Improving  Engagement in Group:  Engaged  Modes of Intervention:  Clarification, Education, and Support  Summary of Progress/Problems: Positive thinking and positive change were discussed. Pt participated and was engaged but gave no eye contact to peer patients or staff.  Marcille Buffy 06/28/2022, 8:48 PM

## 2022-06-28 NOTE — Progress Notes (Signed)
   06/28/22 0515  Sleep  Number of Hours 7.5

## 2022-06-28 NOTE — [facility] Group Notes (Signed)
PsychoEducational Group: Patients were asked to participate in group educating regarding positive reframing and the impact of negative thoughts. A poem was read in which patients were encouraged to identify negative thoughts or beliefs about themselves that were untrue, and negatively impacted there mental wellbeing. Patient attended and participated appropriately.

## 2022-06-28 NOTE — Progress Notes (Addendum)
   During assessment, pt presents with no complaints.  Pt is at medication window, where she keeps her head down most of the time during assessment.  Pt returns to dayroom where she is observed with her head up and engaged in watching television with peers.  Pt requests that writer check her room before she enters to prepare for bed.  Writer checks as requested.  Pt is relieved.  Pt denies SI/HI/AVH, and verbally contracts for safety.  Administered medication.  Provided reassurance and support.  Pt is safe on the unit with Q 15 minute safety checks.    06/28/22 2053  Psych Admission Type (Psych Patients Only)  Admission Status Voluntary  Psychosocial Assessment  Patient Complaints None  Eye Contact Avoids (pt keeps head down)  Facial Expression Fixed smile  Affect Appropriate to circumstance  Speech Soft;Slow  Interaction Cautious;Childlike  Motor Activity Slow  Appearance/Hygiene Disheveled  Behavior Characteristics Cooperative  Mood Suspicious;Preoccupied  Thought Process  Coherency Circumstantial  Content Preoccupation  Delusions None reported or observed  Perception WDL  Hallucination None reported or observed  Judgment Poor  Confusion None  Danger to Self  Current suicidal ideation? Denies  Self-Injurious Behavior No self-injurious ideation or behavior indicators observed or expressed   Agreement Not to Harm Self Yes  Description of Agreement verbal contract for safety  Danger to Others  Danger to Others None reported or observed

## 2022-06-28 NOTE — [facility] Group Notes (Signed)
Pt. Attended group. Pt. Stated that she will keep her eyes open and talk more.

## 2022-06-29 DIAGNOSIS — F251 Schizoaffective disorder, depressive type: Secondary | ICD-10-CM | POA: Diagnosis not present

## 2022-06-29 DIAGNOSIS — F061 Catatonic disorder due to known physiological condition: Secondary | ICD-10-CM | POA: Diagnosis not present

## 2022-06-29 DIAGNOSIS — F333 Major depressive disorder, recurrent, severe with psychotic symptoms: Secondary | ICD-10-CM | POA: Diagnosis not present

## 2022-06-29 DIAGNOSIS — I10 Essential (primary) hypertension: Secondary | ICD-10-CM | POA: Diagnosis not present

## 2022-06-29 DIAGNOSIS — Z20822 Contact with and (suspected) exposure to covid-19: Secondary | ICD-10-CM | POA: Diagnosis not present

## 2022-06-29 MED ORDER — CLOZAPINE 25 MG PO TABS: 350.0000 mg | ORAL_TABLET | Freq: Every day | ORAL | Status: DC

## 2022-06-29 MED ORDER — RISPERIDONE 0.25 MG PO TABS
0.2500 mg | ORAL_TABLET | Freq: Two times a day (BID) | ORAL | Status: DC
  Administered 2022-06-29 – 2022-07-01 (×4): 0.25 mg via ORAL
  Filled 2022-06-29 (×8): qty 1

## 2022-06-29 MED ORDER — CLOZAPINE 25 MG PO TABS
287.5000 mg | ORAL_TABLET | Freq: Every day | ORAL | Status: AC
  Administered 2022-06-30: 287.5 mg via ORAL
  Filled 2022-06-29: qty 4

## 2022-06-29 MED ORDER — CLOZAPINE 25 MG PO TABS
337.5000 mg | ORAL_TABLET | Freq: Every day | ORAL | Status: DC
  Filled 2022-06-29: qty 2

## 2022-06-29 MED ORDER — WHITE PETROLATUM EX OINT
TOPICAL_OINTMENT | CUTANEOUS | Status: AC
  Filled 2022-06-29: qty 5

## 2022-06-29 MED ORDER — CLOZAPINE 25 MG PO TABS
312.5000 mg | ORAL_TABLET | Freq: Every day | ORAL | Status: AC
Start: 2022-07-02 — End: 2022-07-02
  Administered 2022-07-02: 312.5 mg via ORAL
  Filled 2022-06-29: qty 1

## 2022-06-29 MED ORDER — CLOZAPINE 25 MG PO TABS
325.0000 mg | ORAL_TABLET | Freq: Every day | ORAL | Status: DC
  Filled 2022-06-29: qty 1

## 2022-06-29 MED ORDER — CLOZAPINE 100 MG PO TABS
300.0000 mg | ORAL_TABLET | Freq: Every day | ORAL | Status: AC
  Administered 2022-07-01: 300 mg via ORAL
  Filled 2022-06-29: qty 3
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
## 2022-06-29 NOTE — Progress Notes (Signed)
     06/29/22 2037  Psych Admission Type (Psych Patients Only)  Admission Status Voluntary  Psychosocial Assessment  Patient Complaints None  Eye Contact Avoids  Facial Expression Fixed smile  Affect Appropriate to circumstance  Speech Soft;Slow  Interaction Cautious;Childlike  Motor Activity Slow  Appearance/Hygiene Disheveled  Behavior Characteristics Cooperative  Mood Preoccupied;Suspicious  Thought Process  Coherency Circumstantial  Content Preoccupation;Paranoia  Delusions Paranoid  Perception WDL  Hallucination None reported or observed  Judgment Poor  Confusion None  Danger to Self  Current suicidal ideation? Denies  Self-Injurious Behavior No self-injurious ideation or behavior indicators observed or expressed   Agreement Not to Harm Self Yes  Description of Agreement verbal contract for safety  Danger to Others  Danger to Others None reported or observed

## 2022-06-29 NOTE — Plan of Care (Signed)
  Problem: Activity: Goal: Interest or engagement in activities will improve Outcome: Progressing Goal: Sleeping patterns will improve Outcome: Progressing   Problem: Coping: Goal: Ability to demonstrate self-control will improve Outcome: Progressing   

## 2022-06-29 NOTE — Progress Notes (Signed)
     06/29/22 0538  Sleep  Number of Hours 7.75

## 2022-06-29 NOTE — B[facility] Group Notes (Signed)
Adult Psychoeducational Group Note  Date:  06/29/2022 Time:  8:30 PM  Group Topic/Focus:  Wrap-Up Group:   The focus of this group is to help patients review their daily goal of treatment and discuss progress on daily workbooks.  Participation Level:  Active  Participation Quality:  Attentive  Affect:  Appropriate  Cognitive:  Alert  Insight: Appropriate  Engagement in Group:  Engaged  Modes of Intervention:  Discussion  Additional Comments:  Patient attended and participated in the Wrap-up group.  Cheryl Adams 06/29/2022, 8:30 PM

## 2022-06-29 NOTE — Group Note (Signed)
LCSW Group Therapy Note   Group Date: 06/29/2022 Start Time: 1300 End Time: 1400   Type of Therapy and Topic:  Group Therapy: Challenging Core Beliefs  Participation Level:  Active  Description of Group:  Patients were educated about core beliefs and asked to identify one harmful core belief that they have. Patients were asked to explore from where those beliefs originate. Patients were asked to discuss how those beliefs make them feel and the resulting behaviors of those beliefs. They were then be asked if those beliefs are true and, if so, what evidence they have to support them. Lastly, group members were challenged to replace those negative core beliefs with helpful beliefs.   Therapeutic Goals:   1. Patient will identify harmful core beliefs and explore the origins of such beliefs. 2. Patient will identify feelings and behaviors that result from those core beliefs. 3. Patient will discuss whether such beliefs are true. 4.  Patient will replace harmful core beliefs with helpful ones.  Summary of Patient Progress:  Cheryl Adams actively engaged in processing and exploring how core beliefs are formed and how they impact thoughts, feelings, and behaviors. Patient proved open to input from peers and feedback from CSW. Patient demonstrated fair insight into the subject matter, was respectful and supportive of peers, and participated throughout the entire session.  Therapeutic Modalities: Cognitive Behavioral Therapy; Solution-Focused Therapy   Beatris Si, LCSW 06/29/2022  2:13 PM

## 2022-06-29 NOTE — Plan of Care (Signed)
  Problem: Coping: Goal: Ability to verbalize frustrations and anger appropriately will improve Outcome: Progressing Goal: Ability to demonstrate self-control will improve Outcome: Progressing   Problem: Health Behavior/Discharge Planning: Goal: Identification of resources available to assist in meeting health care needs will improve Outcome: Progressing Goal: Compliance with treatment plan for underlying cause of condition will improve Outcome: Progressing

## 2022-06-29 NOTE — [facility] Group Notes (Signed)
Adult Psychoeducational Group Note  Date:  06/29/2022 Time:  2:43 PM  Group Topic/Focus:  Wellness Toolbox:   The focus of this group is to discuss various aspects of wellness, balancing those aspects and exploring ways to increase the ability to experience wellness.  Patients will create a wellness toolbox for use upon discharge.  Participation Level:  Active  Participation Quality:  Attentive  Affect:  Appropriate  Cognitive:  Appropriate  Insight: Appropriate  Engagement in Group:  Engaged  Modes of Intervention:  Activity  Additional Comments:  Patient attended and participated in the relaxation group activity.  Jearl Klinefelter 06/29/2022, 2:43 PM

## 2022-06-29 NOTE — [facility] Group Notes (Signed)
[facility] Group Notes:  (Nursing/MHT/Case Management/Adjunct)  Date:  06/29/2022  Time:  9:50 AM  Type of Therapy:   Orientation/Goals group  Participation Level:  Minimal  Participation Quality:  Resistant  Affect:  Flat and Lethargic  Cognitive:  Confused and Lacking  Insight:  Improving and Lacking  Engagement in Group:  Engaged and Improving  Modes of Intervention:  Discussion, Education, Orientation, and Support  Summary of Progress/Problems: Pt goal for today is to continue to get better and to find a group home to go to.   Cheryl Adams J Carisa Backhaus 06/29/2022, 9:50 AM

## 2022-06-29 NOTE — Group Note (Signed)
Recreation Therapy Group Note   Group Topic:Health and Wellness  Group Date: 06/29/2022 Start Time: 1000 End Time: 1030 Facilitators: Caroll Rancher, LRT,CTRS Location: [address] Dayroom   Goal Area(s) Addresses:  Patient will define components of whole wellness. Patient will verbalize benefit of whole wellness.  Group Description:  Exercise.  LRT and patients discussed the importance of having mental, physical and spiritual stability.  LRT and patients discussed how each area was important to making the individual feel whole.  LRT told the patients when those areas feel off, to take time for themselves to regroup by doing things such as going for a walk, watching a movie, listening to music, etc.  LRT and patients then proceeded to engage in exercise.  LRT led patients in a series of stretches to get loose.  Patients then took turns leading group in an exercise of their choosing.  The activity was to take at least 30 minutes.  Patients were encouraged to take breaks or get water as needed.   Affect/Mood: Appropriate   Participation Level: Engaged   Participation Quality: Independent   Behavior: Appropriate   Speech/Thought Process: Focused   Insight: Good   Judgement: Good   Modes of Intervention: Music and Exercise   Patient Response to Interventions:  Engaged   Education Outcome:  Acknowledges education and In group clarification offered    Clinical Observations/Individualized Feedback:  Pt was bright and engaged in group.  Pt was quiet but was animated during some of the exercises.  Pt led group in some lunges, torso twists and jogging in place.    Plan: Continue to engage patient in RT group sessions 2-3x/week.   Caroll Rancher, Antonietta Jewel 06/29/2022 12:37 PM

## 2022-06-30 DIAGNOSIS — F333 Major depressive disorder, recurrent, severe with psychotic symptoms: Secondary | ICD-10-CM | POA: Diagnosis not present

## 2022-06-30 LAB — CBC WITH DIFFERENTIAL/PLATELET
Abs Immature Granulocytes: 0.01 10*3/uL (ref 0.00–0.07)
Basophils Absolute: 0 10*3/uL (ref 0.0–0.1)
Basophils Relative: 0 %
Eosinophils Absolute: 0.1 10*3/uL (ref 0.0–0.5)
Eosinophils Relative: 3 %
HCT: 43.4 % (ref 36.0–46.0)
Hemoglobin: 13.9 g/dL (ref 12.0–15.0)
Immature Granulocytes: 0 %
Lymphocytes Relative: 29 %
Lymphs Abs: 1.6 10*3/uL (ref 0.7–4.0)
MCH: 26.3 pg (ref 26.0–34.0)
MCHC: 32 g/dL (ref 30.0–36.0)
MCV: 82.2 fL (ref 80.0–100.0)
Monocytes Absolute: 0.3 10*3/uL (ref 0.1–1.0)
Monocytes Relative: 6 %
Neutro Abs: 3.5 10*3/uL (ref 1.7–7.7)
Neutrophils Relative %: 62 %
Platelets: 276 10*3/uL (ref 150–400)
RBC: 5.28 MIL/uL — ABNORMAL HIGH (ref 3.87–5.11)
RDW: 13.9 % (ref 11.5–15.5)
WBC: 5.6 10*3/uL (ref 4.0–10.5)
nRBC: 0 % (ref 0.0–0.2)

## 2022-06-30 LAB — TROPONIN I (HIGH SENSITIVITY): Troponin I (High Sensitivity): 7 ng/L (ref <18)

## 2022-06-30 NOTE — Plan of Care (Signed)
  Problem: Coping: Goal: Ability to verbalize frustrations and anger appropriately will improve Outcome: Progressing Goal: Ability to demonstrate self-control will improve Outcome: Progressing   Problem: Safety: Goal: Periods of time without injury will increase Outcome: Progressing   

## 2022-06-30 NOTE — Progress Notes (Signed)
Adult Psychoeducational Group Note  Date:  06/30/2022 Time:  8:33 PM  Group Topic/Focus:  Wrap-Up Group:   The focus of this group is to help patients review their daily goal of treatment and discuss progress on daily workbooks.  Participation Level:  Active  Participation Quality:  Appropriate  Affect:  Appropriate  Cognitive:  Appropriate  Insight: Appropriate  Engagement in Group:  Improving  Modes of Intervention:  Discussion  Additional Comments:  Pt stated her goal for today was to focus on her treatment plan. Pt stated she accomplished her goal today. Pt stated she talked with her doctor and with her social worker about her care today. Pt rated her overall day a 4 out of 10. Pt stated she made no calls today. Pt stated she felt better about herself tonight. Pt stated she was able to attend all meals today. Pt stated she took all medications provided today. Pt stated her appetite was fair today. Pt rated her sleep last night was fair. Pt stated the goal tonight was to get some rest. Pt stated she had no physical pain tonight. Pt deny visual hallucinations and auditory issues tonight. Pt denies thoughts of harming herself or others. Pt stated she would alert staff if anything changed.   Felipa Furnace 06/30/2022, 8:33 PM
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
## 2022-06-30 NOTE — Progress Notes (Signed)
     06/30/22 0515  Sleep  Number of Hours 6.25

## 2022-06-30 NOTE — Group Note (Signed)
Recreation Therapy Group Note   Group Topic:Coping Skills  Group Date: 06/30/2022 Start Time: 1000 End Time: 1050 Facilitators: Caroll Rancher, LRT,CTRS Location: [address] Dayroom   Goal Area(s) Addresses:  Patient will successfully define what a coping skill is. Patient will create a visual display of at least 5 positive coping skills. Patient will successfully identify benefit of using outlined coping skills post d/c.     Group Description:  Patients were asked to fill in a coping skills chart using images and words found from magazines. As a group, patients were prompted to discuss what coping skills are, when they need to be utilized, and the importance of selection based on various triggers. Coping skills on the chart were identified by 5 categories - Diversion, Social, Cognitive, Tension Releasers, and Physical. LRT requested that patients represent at least 2 coping skills per category. Patients were then asked to present their collage-style artwork to the group and make suggestions to peers, when necessary, who might be have less skills in certain sections of the chart.   Affect/Mood: Appropriate   Participation Level: Engaged   Participation Quality: Independent   Behavior: Appropriate   Speech/Thought Process: Focused   Insight: Good   Judgement: Good   Modes of Intervention: Art   Patient Response to Interventions:  Engaged   Education Outcome:  Acknowledges education and In group clarification offered    Clinical Observations/Individualized Feedback: Pt was pleasant and appeared to enjoy the activity.  Pt was focused on completing her collage.  Pt identified coping skills as follows: Diversions- organizing, cleaning; Social- going out to eat, board games, vacation; Cognitive- apps on phone; Tension Releaser- iced coffee, getting hair done, cooking and Physical- running a marathon, arts and crafts.     Plan: Continue to engage patient in RT group sessions  2-3x/week.   Caroll Rancher, LRT,CTRS 06/30/2022 12:18 PM

## 2022-07-01 ENCOUNTER — Encounter (HOSPITAL_COMMUNITY): Payer: Self-pay

## 2022-07-01 DIAGNOSIS — F251 Schizoaffective disorder, depressive type: Secondary | ICD-10-CM | POA: Diagnosis not present

## 2022-07-01 LAB — CLOZAPINE (CLOZARIL)
Clozapine Lvl: 327 ng/mL — ABNORMAL LOW (ref 350–600)
NorClozapine: 94 ng/mL
Total(Cloz+Norcloz): 421 ng/mL

## 2022-07-01 MED ORDER — RISPERIDONE 0.25 MG PO TABS
0.2500 mg | ORAL_TABLET | Freq: Every day | ORAL | Status: DC
  Administered 2022-07-02 – 2022-07-04 (×3): 0.25 mg via ORAL
  Filled 2022-07-01 (×5): qty 1

## 2022-07-01 MED ORDER — RISPERIDONE 0.5 MG PO TBDP
0.5000 mg | ORAL_TABLET | Freq: Every day | ORAL | Status: DC
  Administered 2022-07-01 – 2022-07-02 (×2): 0.5 mg via ORAL
  Filled 2022-07-01 (×4): qty 1

## 2022-07-01 MED ORDER — RISPERIDONE 0.5 MG PO TBDP: 0.2500 mg | ORAL_TABLET | Freq: Every day | ORAL | Status: DC

## 2022-07-01 NOTE — Plan of Care (Signed)
?  Problem: Activity: ?Goal: Interest or engagement in activities will improve ?Outcome: Progressing ?Goal: Sleeping patterns will improve ?Outcome: Progressing ?  ?Problem: Coping: ?Goal: Ability to verbalize frustrations and anger appropriately will improve ?Outcome: Progressing ?Goal: Ability to demonstrate self-control will improve ?Outcome: Progressing ?  ?Problem: Safety: ?Goal: Periods of time without injury will increase ?Outcome: Progressing ?  ?

## 2022-07-01 NOTE — B[facility] Group Notes (Signed)
Adult Psychoeducational Group Note  Date:  07/01/2022 Time:  9:31 PM  Group Topic/Focus:  Wrap-Up Group:   The focus of this group is to help patients review their daily goal of treatment and discuss progress on daily workbooks.  Participation Level:  Minimal  Participation Quality:  Inattentive  Affect:  Flat  Cognitive:  Alert  Insight: Lacking  Engagement in Group:  Poor  Modes of Intervention:  Discussion, Education, and Reality Testing  Additional Comments:  Pt attended group this evening. Pt did not want to participate in group so writer has minimal information about pt's day. Pt stated that they had a good day, ate well and went to all groups. Pt has no concerns at the moment.   Chrisandra Netters 07/01/2022, 9:31 PM

## 2022-07-01 NOTE — Plan of Care (Signed)
  Problem: Coping: Goal: Ability to verbalize frustrations and anger appropriately will improve Outcome: Progressing   Problem: Coping: Goal: Ability to demonstrate self-control will improve Outcome: Progressing   Problem: Safety: Goal: Periods of time without injury will increase Outcome: Progressing   Problem: Role Relationship: Goal: Ability to communicate needs accurately will improve Outcome: Progressing

## 2022-07-01 NOTE — Progress Notes (Signed)
   07/01/22 0800  Psych Admission Type (Psych Patients Only)  Admission Status Voluntary  Psychosocial Assessment  Patient Complaints None  Eye Contact Avoids  Facial Expression Fixed smile  Affect Appropriate to circumstance  Speech Soft;Slow  Interaction Cautious;Childlike  Motor Activity Slow  Appearance/Hygiene Improved  Behavior Characteristics Cooperative  Mood Suspicious  Aggressive Behavior  Targets Other (Comment)  Thought Process  Coherency Circumstantial  Content Preoccupation  Delusions Paranoid  Perception WDL  Hallucination None reported or observed  Judgment Poor  Confusion None  Danger to Self  Current suicidal ideation? Denies  Self-Injurious Behavior No self-injurious ideation or behavior indicators observed or expressed   Agreement Not to Harm Self Yes  Description of Agreement Verbal  Danger to Others  Danger to Others None reported or observed    

## 2022-07-01 NOTE — Progress Notes (Signed)
     07/01/22 2109  Psych Admission Type (Psych Patients Only)  Admission Status Voluntary  Psychosocial Assessment  Patient Complaints None  Eye Contact Avoids  Facial Expression Fixed smile  Affect Appropriate to circumstance  Speech Soft;Slow  Interaction Cautious;Childlike  Motor Activity Slow  Appearance/Hygiene Improved  Behavior Characteristics Cooperative  Mood Suspicious  Thought Process  Coherency Circumstantial  Content Preoccupation  Delusions Paranoid  Perception WDL  Hallucination None reported or observed  Judgment Poor  Confusion None  Danger to Self  Current suicidal ideation? Denies  Self-Injurious Behavior No self-injurious ideation or behavior indicators observed or expressed   Agreement Not to Harm Self Yes  Description of Agreement verbal contract for safety  Danger to Others  Danger to Others None reported or observed

## 2022-07-01 NOTE — Group Note (Signed)
[facility] LCSW Group Therapy Note  Date/Time: 07/01/2022 @1pm   Type of Therapy and Topic:  Group Therapy:  Who Am I?  Self Esteem, Self-Actualization and Understanding Self.  Participation Level:  Active  Description of Group:    In this group patients will be asked to explore values, beliefs, truths, and morals as they relate to personal self.  Patients will be guided to discuss their thoughts, feelings, and behaviors related to what they identify as important to their true self. Patients will process together how values, beliefs and truths are connected to specific choices patients make every day. Each patient will be challenged to identify changes that they are motivated to make in order to improve self-esteem and self-actualization. This group will be process-oriented, with patients participating in exploration of their own experiences as well as giving and receiving support and challenge from other group members.  Therapeutic Goals: Patient will identify false beliefs that currently interfere with their self-esteem.  Patient will identify feelings, thought process, and behaviors related to self and will become aware of the uniqueness of themselves and of others.  Patient will be able to identify and verbalize values, morals, and beliefs as they relate to self. Patient will begin to learn how to build self-esteem/self-awareness by expressing what is important and unique to them personally.  Summary of Patient Progress: Lakima participated appropriately in group.  She identified herself as someone she admires for her maturity and wisdom.  Patient participated in group activity and assisted others with identifying values.         Therapeutic Modalities:   Cognitive Behavioral Therapy Solution Focused Therapy Motivational Interviewing Brief Therapy   Rilen Shukla, LCSW, LCAS Clincal Social Worker  [facility]

## 2022-07-01 NOTE — Progress Notes (Signed)
   07/01/22 0100  Psych Admission Type (Psych Patients Only)  Admission Status Voluntary  Psychosocial Assessment  Patient Complaints None  Eye Contact Avoids  Facial Expression Fixed smile  Affect Appropriate to circumstance  Speech Soft;Slow  Interaction Cautious;Childlike  Motor Activity Slow  Appearance/Hygiene Improved  Behavior Characteristics Cooperative  Mood Suspicious  Thought Process  Coherency Circumstantial  Content Preoccupation;Paranoia  Delusions Paranoid  Perception WDL  Hallucination None reported or observed  Judgment Poor  Confusion None  Danger to Self  Current suicidal ideation? Denies  Self-Injurious Behavior No self-injurious ideation or behavior indicators observed or expressed   Agreement Not to Harm Self Yes  Description of Agreement Verbal  Danger to Others  Danger to Others None reported or observed

## 2022-07-01 NOTE — [facility] Group Notes (Signed)
Adult Psychoeducational Group Note  Date:  07/01/2022 Time:  9:38 AM  Group Topic/Focus:  Goals Group:   The focus of this group is to help patients establish daily goals to achieve during treatment and discuss how the patient can incorporate goal setting into their daily lives to aide in recovery.  Participation Level:  Active  Participation Quality:  Appropriate  Affect:  Flat  Cognitive:  Appropriate  Insight: Improving  Engagement in Group:  Improving  Modes of Intervention:  Discussion  Additional Comments:    Donell Beers  07/01/2022, 9:38 AM

## 2022-07-01 NOTE — Plan of Care (Signed)
  Problem: Coping: Goal: Ability to demonstrate self-control will improve Outcome: Progressing   Problem: Health Behavior/Discharge Planning: Goal: Compliance with treatment plan for underlying cause of condition will improve Outcome: Progressing   Problem: Coping: Goal: Coping ability will improve Outcome: Progressing   

## 2022-07-01 NOTE — Plan of Care (Signed)
  Problem: Education: Goal: Emotional status will improve Outcome: Progressing   Problem: Activity: Goal: Interest or engagement in activities will improve Outcome: Progressing Goal: Sleeping patterns will improve Outcome: Progressing   Problem: Education: Goal: Mental status will improve Outcome: Not Progressing Goal: Verbalization of understanding the information provided will improve Outcome: Not Progressing

## 2022-07-01 NOTE — BH IP Treatment Plan (Signed)
Interdisciplinary Treatment and Diagnostic Plan Update  07/01/2022 Time of Session: 9:40am  Cheryl Adams MRN: [id number]  Principal Diagnosis: Schizoaffective disorder, depressive type (HCC)  Secondary Diagnoses: Principal Problem:   Schizoaffective disorder, depressive type (HCC) Active Problems:   Insomnia   GAD (generalized anxiety disorder)   Vitamin D deficiency   Current Medications:  Current Facility-Administered Medications  Medication Dose Route Frequency Provider Last Rate Last Admin   acetaminophen (TYLENOL) tablet 500 mg  500 mg Oral Q6H PRN Massengill, Harrold Donath, MD       amLODipine (NORVASC) tablet 10 mg  10 mg Oral Daily Massengill, Harrold Donath, MD   10 mg at 07/01/22 0758   antiseptic oral rinse (BIOTENE) solution 15 mL  15 mL Mouth Rinse 5 X Daily PRN Comer Locket, MD       atenolol (TENORMIN) tablet 25 mg  25 mg Oral Daily Massengill, Harrold Donath, MD   25 mg at 07/01/22 0758   cloZAPine (CLOZARIL) tablet 300 mg  300 mg Oral QHS Massengill, Harrold Donath, MD       Followed by   [name] ON 07/02/2022] cloZAPine (CLOZARIL) tablet 312.5 mg  312.5 mg Oral QHS Massengill, Harrold Donath, MD       Followed by   [name] ON 07/03/2022] cloZAPine (CLOZARIL) tablet 325 mg  325 mg Oral QHS Massengill, Harrold Donath, MD       Followed by   [name] ON 07/04/2022] cloZAPine (CLOZARIL) tablet 337.5 mg  337.5 mg Oral QHS Massengill, Harrold Donath, MD       Followed by   [name] ON 07/05/2022] cloZAPine (CLOZARIL) tablet 350 mg  350 mg Oral QHS Massengill, Nathan, MD       docusate sodium (COLACE) capsule 100 mg  100 mg Oral Daily PRN Mason Jim, Amy E, MD       ferrous sulfate tablet 325 mg  325 mg Oral Daily Princess Bruins, DO   325 mg at 07/01/22 6387   hydrOXYzine (ATARAX) tablet 25 mg  25 mg Oral TID PRN Phineas Inches, MD   25 mg at 06/17/22 2027   OLANZapine zydis (ZYPREXA) disintegrating tablet 5 mg  5 mg Oral Q8H PRN Massengill, Harrold Donath, MD   5 mg at 05/24/22 2154   And   LORazepam (ATIVAN) tablet 1 mg  1 mg Oral Q8H PRN  Massengill, Harrold Donath, MD       And   ziprasidone (GEODON) injection 20 mg  20 mg Intramuscular Q8H PRN Massengill, Harrold Donath, MD       metFORMIN (GLUCOPHAGE-XR) 24 hr tablet 500 mg  500 mg Oral Q breakfast Massengill, Nathan, MD   500 mg at 07/01/22 0758   methylphenidate (RITALIN) tablet 5 mg  5 mg Oral Daily Massengill, Nathan, MD   5 mg at 07/01/22 0800   risperiDONE (RISPERDAL) tablet 0.25 mg  0.25 mg Oral Q12H Massengill, Nathan, MD   0.25 mg at 07/01/22 0758   sertraline (ZOLOFT) tablet 100 mg  100 mg Oral Daily Massengill, Harrold Donath, MD   100 mg at 07/01/22 5643   Vitamin D (Ergocalciferol) (DRISDOL) capsule 50,000 Units  50,000 Units Oral Q7 days Starleen Blue, NP   50,000 Units at 06/29/22 0802   PTA Medications: Medications Prior to Admission  Medication Sig Dispense Refill Last Dose   ferrous sulfate 325 (65 FE) MG tablet Take 325 mg by mouth daily.      risperiDONE (RISPERDAL) 1 MG tablet Take 1 tablet (1 mg total) by mouth daily.      risperiDONE (RISPERDAL) 2 MG tablet Take  1 tablet (2 mg total) by mouth at bedtime.       Patient Stressors: Health problems   Marital or family conflict   Medication change or noncompliance    Patient Strengths: Average or above average intelligence  Supportive family/friends   Treatment Modalities: Medication Management, Group therapy, Case management,  1 to 1 session with clinician, Psychoeducation, Recreational therapy.   Physician Treatment Plan for Primary Diagnosis: Schizoaffective disorder, depressive type (HCC) Long Term Goal(s): Improvement in symptoms so as ready for discharge   Short Term Goals: Ability to verbalize feelings will improve Ability to disclose and discuss suicidal ideas Ability to identify and develop effective coping behaviors will improve Compliance with prescribed medications will improve  Medication Management: Evaluate patient's response, side effects, and tolerance of medication regimen.  Therapeutic  Interventions: 1 to 1 sessions, Unit Group sessions and Medication administration.  Evaluation of Outcomes: Progressing  Physician Treatment Plan for Secondary Diagnosis: Principal Problem:   Schizoaffective disorder, depressive type (HCC) Active Problems:   Insomnia   GAD (generalized anxiety disorder)   Vitamin D deficiency  Long Term Goal(s): Improvement in symptoms so as ready for discharge   Short Term Goals: Ability to verbalize feelings will improve Ability to disclose and discuss suicidal ideas Ability to identify and develop effective coping behaviors will improve Compliance with prescribed medications will improve     Medication Management: Evaluate patient's response, side effects, and tolerance of medication regimen.  Therapeutic Interventions: 1 to 1 sessions, Unit Group sessions and Medication administration.  Evaluation of Outcomes: Progressing   RN Treatment Plan for Primary Diagnosis: Schizoaffective disorder, depressive type (HCC) Long Term Goal(s): Knowledge of disease and therapeutic regimen to maintain health will improve  Short Term Goals: Ability to remain free from injury will improve, Ability to participate in decision making will improve, Ability to verbalize feelings will improve, Ability to disclose and discuss suicidal ideas, and Ability to identify and develop effective coping behaviors will improve  Medication Management: RN will administer medications as ordered by provider, will assess and evaluate patient's response and provide education to patient for prescribed medication. RN will report any adverse and/or side effects to prescribing provider.  Therapeutic Interventions: 1 on 1 counseling sessions, Psychoeducation, Medication administration, Evaluate responses to treatment, Monitor vital signs and CBGs as ordered, Perform/monitor CIWA, COWS, AIMS and Fall Risk screenings as ordered, Perform wound care treatments as ordered.  Evaluation of  Outcomes: Progressing   LCSW Treatment Plan for Primary Diagnosis: Schizoaffective disorder, depressive type (HCC) Long Term Goal(s): Safe transition to appropriate next level of care at discharge, Engage patient in therapeutic group addressing interpersonal concerns.  Short Term Goals: Engage patient in aftercare planning with referrals and resources, Increase social support, Increase emotional regulation, Facilitate acceptance of mental health diagnosis and concerns, Identify triggers associated with mental health/substance abuse issues, and Increase skills for wellness and recovery  Therapeutic Interventions: Assess for all discharge needs, 1 to 1 time with Social worker, Explore available resources and support systems, Assess for adequacy in community support network, Educate family and significant other(s) on suicide prevention, Complete Psychosocial Assessment, Interpersonal group therapy.  Evaluation of Outcomes: Progressing   Progress in Treatment: Attending groups: Yes. Participating in groups: No. Taking medication as prescribed: Yes. Toleration medication: Yes. Family/Significant other contact made: Yes, individual(s) contacted:  mother, Sequoya Hogsett  Patient understands diagnosis: No. Discussing patient identified problems/goals with staff: Yes. Medical problems stabilized or resolved: Yes. Denies suicidal/homicidal ideation: Yes. Issues/concerns per patient self-inventory: No.  New problem(s) identified: No, Describe:  none reported    New Short Term/Long Term Goal(s):   medication stabilization, elimination of SI thoughts, development of comprehensive mental wellness plan.      Patient Goals:  No additional goals identified at this time. Patient to continue to work towards original goals identified in initial treatment team meeting. CSW will remain available to patient should they voice additional treatment goals.    Discharge Plan or Barriers:  No psychosocial  barriers identified at this time. Physicians continue to adjust medications to assist with paranoid thoughts.  Patient currently has paranoia around her main support system, parents.  Patient will be discharged to residence upon discharge.  Patient is connected to med management and therapy upon discharge.    Reason for Continuation of Hospitalization: Delusions  Depression Medication stabilization   Estimated Length of Stay: 5-7 days   Last 3 [place] Suicide Severity Risk Score: [place] Admission (Current) from 05/19/2022 in [place] [facility] ED from 05/18/2022 in [facility] Visit from 04/14/2022 in [facility] No Risk High Risk No Risk       Last PHQ 2/9 Scores:    04/14/2022   10:46 AM 02/24/2022   11:12 AM 01/13/2022    9:14 AM  Depression screen PHQ 2/9  Decreased Interest 0 0 0  Down, Depressed, Hopeless 0 0 0  PHQ - 2 Score 0 0 0    Scribe for Treatment Team: Darleen Crocker, Latanya Presser 07/01/2022 9:14 AM
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
## 2022-07-01 NOTE — Progress Notes (Signed)
   07/01/22 0800  Psych Admission Type (Psych Patients Only)  Admission Status Voluntary  Psychosocial Assessment  Patient Complaints None  Eye Contact Avoids  Facial Expression Fixed smile  Affect Appropriate to circumstance  Speech Soft;Slow  Interaction Cautious;Childlike  Motor Activity Slow  Appearance/Hygiene Improved  Behavior Characteristics Cooperative  Mood Suspicious  Aggressive Behavior  Targets Other (Comment)  Thought Process  Coherency Circumstantial  Content Preoccupation  Delusions Paranoid  Perception WDL  Hallucination None reported or observed  Judgment Poor  Confusion None  Danger to Self  Current suicidal ideation? Denies  Self-Injurious Behavior No self-injurious ideation or behavior indicators observed or expressed   Agreement Not to Harm Self Yes  Description of Agreement Verbal  Danger to Others  Danger to Others None reported or observed

## 2022-07-01 NOTE — Group Note (Signed)
Recreation Therapy Group Note   Group Topic:Team Building  Group Date: 07/01/2022 Start Time: 1010 End Time: 1100 Facilitators: Caroll Rancher, LRT,CTRS Location: [address] Dayroom   Goal Area(s) Addresses:  Patient will effectively work with peer towards shared goal.  Patient will identify skills used to make activity successful.  Patient will identify how skills used during activity can be used to reach post d/c goals.    Group Description:  Straw Bridge. In teams of 3-5, patients were given 15 plastic drinking straws and an equal length of masking tape. Using the materials provided, patients were instructed to build a free standing bridge-like structure to suspend an everyday item (ex: puzzle box) off of the floor or table surface. All materials were required to be used by the team in their design. LRT facilitated post-activity discussion reviewing team process. Patients were encouraged to reflect how the skills used in this activity can be generalized to daily life post discharge.    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  Pt was pleasant.  Pt expressed the group used measurements to complete activity. Pt was unable to explain further.     Plan: Continue to engage patient in RT group sessions 2-3x/week.   Caroll Rancher, LRT,CTRS 07/01/2022 1:09 PM

## 2022-07-02 DIAGNOSIS — F251 Schizoaffective disorder, depressive type: Secondary | ICD-10-CM | POA: Diagnosis not present

## 2022-07-02 LAB — SARS CORONAVIRUS 2 BY RT PCR: SARS Coronavirus 2 by RT PCR: NEGATIVE

## 2022-07-02 NOTE — Progress Notes (Signed)
   07/02/22 2130  Psych Admission Type (Psych Patients Only)  Admission Status Voluntary  Psychosocial Assessment  Patient Complaints None  Eye Contact Fair  Facial Expression Flat  Affect Appropriate to circumstance  Speech Soft;Slow  Interaction Cautious;Forwards little;Guarded  Motor Activity Slow  Appearance/Hygiene Improved  Behavior Characteristics Cooperative  Mood Depressed;Suspicious  Aggressive Behavior  Effect No apparent injury  Thought Process  Coherency Circumstantial  Content Preoccupation  Delusions Paranoid  Perception WDL  Hallucination None reported or observed  Judgment Poor  Confusion None  Danger to Self  Current suicidal ideation? Denies  Danger to Others  Danger to Others None reported or observed

## 2022-07-02 NOTE — [facility] Group Notes (Signed)
Adult Psychoeducational Group Note  Date:  07/02/2022 Time:  4:23 PM  Group Topic/Focus:  Wellness Toolbox:   The focus of this group is to discuss various aspects of wellness, balancing those aspects and exploring ways to increase the ability to experience wellness.  Patients will create a wellness toolbox for use upon discharge.  Participation Level:  Active  Participation Quality:  Attentive  Affect:  Appropriate  Cognitive:  Alert  Insight: Appropriate  Engagement in Group:  Engaged  Modes of Intervention:  Activity  Additional Comments:  Patient attended and participated in the relaxation group activity.  Jearl Klinefelter 07/02/2022, 4:23 PM
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
## 2022-07-02 NOTE — BHH Group Notes (Signed)
Adult Psychoeducational Group Note  Date:  07/02/2022 Time:  8:47 PM  Group Topic/Focus:  Wrap-Up Group:   The focus of this group is to help patients review their daily goal of treatment and discuss progress on daily workbooks.  Participation Level:  Active  Participation Quality:  Attentive  Affect:  Appropriate  Cognitive:  Alert  Insight: Appropriate  Engagement in Group:  Engaged  Modes of Intervention:  Discussion  Additional Comments:  Patient attended and participated in the Wrap-up group.  Cheryl Adams 07/02/2022, 8:47 PM

## 2022-07-02 NOTE — Group Note (Signed)
Recreation Therapy Group Note   Group Topic:Leisure Education  Group Date: 07/02/2022 Start Time: 1000 End Time: 1045 Facilitators: Caroll Rancher, LRT,CTRS Location: [address] Dayroom   Goal Area(s) Addresses:  Patient will review and complete packet supporting identification of healthy leisure and recreation activities.   Clinical Observations/Individualized Feedback:   Recreation therapy group did not occur due COVID being on the unit.  LRT provided pt a workbook reviewing leisure and its impact on emotional states and personal fulfillment. Pt given the option to complete the packet in the dayroom or at their own pace in their room.   Plan: Continue to engage patient in RT group sessions 2-3x/week.   Caroll Rancher, LRT,CTRS  07/02/2022 11:53 AM

## 2022-07-02 NOTE — Plan of Care (Signed)
?  Problem: Activity: ?Goal: Interest or engagement in activities will improve ?Outcome: Progressing ?Goal: Sleeping patterns will improve ?Outcome: Progressing ?  ?Problem: Coping: ?Goal: Ability to verbalize frustrations and anger appropriately will improve ?Outcome: Progressing ?Goal: Ability to demonstrate self-control will improve ?Outcome: Progressing ?  ?Problem: Safety: ?Goal: Periods of time without injury will increase ?Outcome: Progressing ?  ?

## 2022-07-02 NOTE — Progress Notes (Signed)
   07/02/22 0800  Psych Admission Type (Psych Patients Only)  Admission Status Voluntary  Psychosocial Assessment  Patient Complaints None  Eye Contact Avoids  Facial Expression Fixed smile  Affect Appropriate to circumstance  Speech Soft;Slow  Interaction Cautious;Childlike  Motor Activity Slow  Appearance/Hygiene Improved  Behavior Characteristics Cooperative  Mood Depressed;Suspicious  Aggressive Behavior  Effect No apparent injury  Thought Process  Coherency Circumstantial  Content Preoccupation  Delusions Paranoid  Perception WDL  Hallucination None reported or observed  Judgment Poor  Confusion None  Danger to Self  Current suicidal ideation? Denies  Self-Injurious Behavior No self-injurious ideation or behavior indicators observed or expressed   Agreement Not to Harm Self Yes  Description of Agreement verbal contracts for safety  Danger to Others  Danger to Others None reported or observed

## 2022-07-02 NOTE — Progress Notes (Signed)
    07/02/22 0500  Sleep  Number of Hours 6.75

## 2022-07-03 DIAGNOSIS — F251 Schizoaffective disorder, depressive type: Secondary | ICD-10-CM | POA: Diagnosis not present

## 2022-07-03 MED ORDER — CLOZAPINE 25 MG PO TABS
325.0000 mg | ORAL_TABLET | Freq: Every day | ORAL | Status: AC
  Administered 2022-07-03: 325 mg via ORAL
  Filled 2022-07-03: qty 1

## 2022-07-03 MED ORDER — CLOZAPINE 25 MG PO TABS
337.5000 mg | ORAL_TABLET | Freq: Every day | ORAL | Status: AC
  Administered 2022-07-04: 337.5 mg via ORAL
  Filled 2022-07-03: qty 2

## 2022-07-03 MED ORDER — CLOZAPINE 25 MG PO TABS
350.0000 mg | ORAL_TABLET | Freq: Every day | ORAL | Status: DC
  Administered 2022-07-05 – 2022-07-06 (×2): 350 mg via ORAL
  Filled 2022-07-03 (×4): qty 2

## 2022-07-03 MED ORDER — RISPERIDONE 0.25 MG PO TABS
0.7500 mg | ORAL_TABLET | Freq: Every day | ORAL | Status: DC
  Administered 2022-07-03: 0.75 mg via ORAL
  Filled 2022-07-03 (×3): qty 3

## 2022-07-03 NOTE — BHH Group Notes (Signed)
  Spirituality group facilitated by Kathleen Argue, BCC.   Group Description: Group focused on topic of hope. Patients participated in facilitated discussion around topic, connecting with one another around experiences and definitions for hope. Group members engaged with visual explorer photos, reflecting on what hope looks like for them today. Group engaged in discussion around how their definitions of hope are present today in hospital.   Modalities: Psycho-social ed, Adlerian, Narrative, MI   Patient Progress: Cheryl Adams attended group and engaged in group conversation.  She participated when invited to do so and her comments were on topic and appropriate.  [address], Bcc Pager, [phone]

## 2022-07-03 NOTE — BHH Counselor (Signed)
CSW spoke with Sherlean Foot (Mother) about Guardianship and other options to help her daughter after discharge.  Mrs. Lodico states that she is interested in obtaining guardianship over her daughter and will be looking into this option on Monday.  She states that her daughter is close to her Roxy Horseman and that Mrs. Mayden will be speaking with him this weekend about what can be done to help her daughter.  She states that she is also open to getting an apartment for her daughter and keeps an eye on her from a short distance.  She states that she is interested in her daughter having an ACT Team that comes out and checks in with her multiple times a week.  She states that she would also be open to a family meeting at [facility] with her daughter if her daughter is open to it as well.  CSW will speak with Mrs. Steinmiller again on Monday after speaking with the providers.

## 2022-07-03 NOTE — Progress Notes (Addendum)
Pt having hard time sleeping, pt anxious about going to her room, pt keeps coming out having to be redirected back to her room . Pt visibly anxious, pt given PRN Ativan per [facility]

## 2022-07-03 NOTE — Progress Notes (Signed)
   07/03/22 1000  Psych Admission Type (Psych Patients Only)  Admission Status Voluntary  Psychosocial Assessment  Patient Complaints None  Eye Contact Fair  Facial Expression Flat  Affect Appropriate to circumstance  Speech Soft;Slow  Interaction Cautious;Childlike;Forwards little  Motor Activity Slow  Appearance/Hygiene Improved  Behavior Characteristics Cooperative  Mood Depressed;Suspicious  Aggressive Behavior  Effect No apparent injury  Thought Process  Coherency Circumstantial  Content Preoccupation  Delusions Paranoid  Perception WDL  Hallucination None reported or observed  Judgment Poor  Confusion None  Danger to Self  Current suicidal ideation? Denies  Self-Injurious Behavior No self-injurious ideation or behavior indicators observed or expressed   Agreement Not to Harm Self Yes  Description of Agreement Verbal  Danger to Others  Danger to Others None reported or observed

## 2022-07-03 NOTE — BHH Group Notes (Signed)
Adult Psychoeducational Group Note  Date:  07/03/2022 Time:  2:23 PM  Group Topic/Focus:  Wellness Toolbox:   The focus of this group is to discuss various aspects of wellness, balancing those aspects and exploring ways to increase the ability to experience wellness.  Patients will create a wellness toolbox for use upon discharge.  Participation Level:  Active  Participation Quality:  Attentive  Affect:  Appropriate  Cognitive:  Alert  Insight: Appropriate  Engagement in Group:  Engaged  Modes of Intervention:  Activity  Additional Comments:  Patient attended and participated in the relaxation group activity.  Jearl Klinefelter 07/03/2022, 2:23 PM

## 2022-07-03 NOTE — BHH Group Notes (Signed)
Pt attended and contributed to group 

## 2022-07-03 NOTE — Progress Notes (Signed)
Adult Psychoeducational Group Note  Date:  07/03/2022 Time:  9:08 PM  Group Topic/Focus:  Wrap-Up Group:   The focus of this group is to help patients review their daily goal of treatment and discuss progress on daily workbooks.  Participation Level:  Active  Participation Quality:  Appropriate  Affect:  Appropriate  Cognitive:  Appropriate  Insight: Appropriate and Limited  Engagement in Group:  Engaged  Modes of Intervention:  Discussion  Additional Comments:  Pt was quiet but engaged during group discussion. Pt states that she had a good day and says that she enjoyed the group about coping skills that she had. Pt states that's she had good conversations with her doctors and she enjoyed that movie she watched. Pt identified one of her goals was to meditate more often; a skill she picked up in spiritual therapy.  Vevelyn Pat 07/03/2022, 9:08 PM

## 2022-07-03 NOTE — Progress Notes (Signed)
   07/03/22 2015  Psych Admission Type (Psych Patients Only)  Admission Status Voluntary  Psychosocial Assessment  Patient Complaints None  Eye Contact Fair  Facial Expression Flat  Affect Appropriate to circumstance  Speech Soft;Slow  Interaction Cautious;Forwards little;Guarded  Motor Activity Slow  Appearance/Hygiene Improved  Behavior Characteristics Cooperative  Mood Depressed;Preoccupied  Aggressive Behavior  Effect No apparent injury  Thought Process  Coherency Circumstantial  Content Preoccupation  Delusions Paranoid  Perception WDL  Hallucination None reported or observed  Judgment Poor  Confusion None  Danger to Self  Current suicidal ideation? Denies  Danger to Others  Danger to Others None reported or observed
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
## 2022-07-03 NOTE — Progress Notes (Signed)
   07/03/22 0500  Sleep  Number of Hours 7.5

## 2022-07-03 NOTE — Group Note (Signed)
Recreation Therapy Group Note   Group Topic:Stress Management  Group Date: 07/03/2022 Start Time: 1000 End Time: 1035 Facilitators: Caroll Rancher, LRT,CTRS Location: [address] Dayroom   Goal Area(s) Addresses:  Patient will identify symptoms of stress. Patient will identify what causes stress. Patient will identify was to cope with stress post d/c.   Group Description:  LRT provided each patient with a packet that went over different elements of stress.  LRT reviewed and went over packet with the group.  LRT and patients discusses the physical, emotional and behavioral symptoms of stress and patients identified the symptoms they experience most.  Patients also identified triggers to stress and identified the things they can control versus what they can't.   Affect/Mood: Appropriate   Participation Level: Active   Participation Quality: Independent   Behavior: Appropriate   Speech/Thought Process: Focused   Insight: Good   Judgement: Good   Modes of Intervention: Worksheet   Patient Response to Interventions:  Engaged   Education Outcome:  Acknowledges education and In group clarification offered    Clinical Observations/Individualized Feedback: Pt was quiet but engaged when prompted.  Pt expressed some of the symptoms she experiences are worry and back pain.  Pt explained she deals with stress through isolation.  When questioned about, pt stated it works for her when she is in a safe place.  Pt stated her triggers to stress were yelling, arguments, not being listened to and being misunderstood.  Pt deals with these triggers through music, cleaning and walking.    Plan: Continue to engage patient in RT group sessions 2-3x/week.   Caroll Rancher, LRT,CTRS  07/03/2022 12:51 PM

## 2022-07-03 NOTE — Group Note (Signed)
[facility] LCSW Group Therapy Note   Group Date: 07/03/2022 Start Time: 1100 End Time: 1200   Type of Therapy/Topic:  Group Therapy:  Emotion Regulation  Participation Level:  Active   Mood: Anxious and Inspired  Description of Group:    The purpose of this group is to assist patients in learning to regulate negative emotions and experience positive emotions. Patients will be guided to discuss ways in which they have been vulnerable to their negative emotions. These vulnerabilities will be juxtaposed with experiences of positive emotions or situations, and patients challenged to use positive emotions to combat negative ones. Special emphasis will be placed on coping with negative emotions in conflict situations, and patients will process healthy conflict resolution skills.  Therapeutic Goals: Patient will identify two positive emotions or experiences to reflect on in order to balance out negative emotions:  Patient will label two or more emotions that they find the most difficult to experience:  Patient will be able to demonstrate positive conflict resolution skills through discussion or role plays:   Summary of Patient Progress:   Pt participated appropriately in group.  Patient discussed that group have allowed her to express herself and different emotions that she has been having.  She reports that getting involved in activities is how she has handled her emotions.  Patient has good insight into group topic.     Therapeutic Modalities:   Cognitive Behavioral Therapy Feelings Identification Dialectical Behavioral Therapy   Sukhdeep Wieting E Loyd Salvador, LCSW

## 2022-07-03 NOTE — BHH Counselor (Signed)
CSW attempted to speak with the Pt about signing a referral for [facility] to help the Pt with Disability and Housing after discharge.  The Pt stated "I do not feel comfortable talking about this right now and I don't want to sign anything".  CSW explained 2 more times what the form was for and the Pt continued to decline the signature.  CSW will keep the form (which has not been dated yet) and will attempt to speak with the Pt about it again at a later date.

## 2022-07-04 DIAGNOSIS — F333 Major depressive disorder, recurrent, severe with psychotic symptoms: Secondary | ICD-10-CM | POA: Diagnosis not present

## 2022-07-04 MED ORDER — WHITE PETROLATUM EX OINT
TOPICAL_OINTMENT | CUTANEOUS | Status: AC
  Administered 2022-07-04: 2
  Filled 2022-07-04: qty 10

## 2022-07-04 MED ORDER — RISPERIDONE 1 MG PO TABS
1.0000 mg | ORAL_TABLET | Freq: Every day | ORAL | Status: DC
  Administered 2022-07-04 – 2022-07-05 (×2): 1 mg via ORAL
  Filled 2022-07-04 (×4): qty 1

## 2022-07-04 MED ORDER — DOCUSATE SODIUM 100 MG PO CAPS
100.0000 mg | ORAL_CAPSULE | Freq: Every day | ORAL | Status: DC
  Administered 2022-07-04 – 2022-07-13 (×10): 100 mg via ORAL
  Filled 2022-07-04 (×12): qty 1

## 2022-07-04 MED ORDER — RISPERIDONE 0.5 MG PO TABS
0.5000 mg | ORAL_TABLET | Freq: Every day | ORAL | Status: DC
  Administered 2022-07-05 – 2022-07-06 (×2): 0.5 mg via ORAL
  Filled 2022-07-04 (×5): qty 1

## 2022-07-04 MED ORDER — POLYETHYLENE GLYCOL 3350 17 G PO PACK
17.0000 g | PACK | Freq: Every day | ORAL | Status: DC
  Administered 2022-07-04 – 2022-07-14 (×10): 17 g via ORAL
  Filled 2022-07-04 (×12): qty 1

## 2022-07-04 NOTE — Progress Notes (Signed)
   07/04/22 2200  Psych Admission Type (Psych Patients Only)  Admission Status Voluntary  Psychosocial Assessment  Patient Complaints None  Eye Contact Fair  Facial Expression Flat  Affect Appropriate to circumstance  Speech Soft;Slow  Interaction Cautious;Forwards little  Motor Activity Slow  Appearance/Hygiene Improved  Behavior Characteristics Cooperative  Mood Depressed;Preoccupied  Aggressive Behavior  Effect No apparent injury  Thought Process  Coherency Circumstantial  Content Preoccupation  Delusions Paranoid  Perception WDL  Hallucination None reported or observed  Judgment Poor  Confusion None  Danger to Self  Current suicidal ideation? Denies  Self-Injurious Behavior No self-injurious ideation or behavior indicators observed or expressed   Agreement Not to Harm Self Yes  Description of Agreement verbal  Danger to Others  Danger to Others None reported or observed

## 2022-07-04 NOTE — Progress Notes (Signed)
   07/04/22 0800  Psych Admission Type (Psych Patients Only)  Admission Status Voluntary  Psychosocial Assessment  Patient Complaints None  Eye Contact Fair  Facial Expression Flat  Affect Appropriate to circumstance  Speech Soft;Slow  Interaction Cautious;Forwards little  Motor Activity Slow  Appearance/Hygiene Improved  Behavior Characteristics Cooperative  Mood Depressed;Preoccupied  Aggressive Behavior  Effect No apparent injury  Thought Process  Coherency Circumstantial  Content Preoccupation  Delusions Paranoid  Perception WDL  Hallucination None reported or observed  Judgment Poor  Confusion None  Danger to Self  Current suicidal ideation? Denies  Self-Injurious Behavior No self-injurious ideation or behavior indicators observed or expressed   Agreement Not to Harm Self Yes  Description of Agreement Verbal  Danger to Others  Danger to Others None reported or observed
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
## 2022-07-04 NOTE — Group Note (Signed)
  [facility] LCSW Group Therapy Note  Date/Time:  07/04/2022 10:00am-11:00am  Type of Therapy and Topic:  Group Therapy:  Stability after Discharge  Participation Level:  Minimal   Description of Group This process group involved patients discussing what their overall goal is at this time in their life and how they plan to work toward that goal when they get home from the hospital.  The group started with patients sharing their goal and plans, then proceeded with group members identifying with each other.  A discussion ensued about the differences in healthy and unhealthy coping skills and a variety of specific coping skills that might be helpful in specific instances were shared.  Group members shared ideas about making changes when they return home so that they can stay well and in recovery.  These included boundaries, putting oneself first, staying on medications, talking to a therapist, and more  Therapeutic Goals Patient will identify one overall goal Patient will list current ideas for how to go about achieving their goal Patient will participate in generating ideas about healthy self-care options when they return to the community Patient will be supportive of one another and receive support from others Patient will receive affirmations and hope  Summary of Patient Progress:  The patient expressed her goal is "to have healthy boundaries with people  and to have a healthy routine at home."  The plan currently on how to achieve that goal is to create a schedule that is more balanced.   The patient also had her eyes closed throughout group except during the brief period when she was speaking.   Therapeutic Modalities Brief Solution-Focused Therapy Psychoeducation   Ambrose Mantle, LCSW 07/04/2022, 12:00pm

## 2022-07-04 NOTE — Progress Notes (Signed)
   07/04/22 0515  Sleep  Number of Hours 7    

## 2022-07-04 NOTE — Progress Notes (Signed)
Adult Psychoeducational Group Note  Date:  07/04/2022 Time:  9:06 PM  Group Topic/Focus:  Wrap-Up Group:   The focus of this group is to help patients review their daily goal of treatment and discuss progress on daily workbooks.  Participation Level:  Active  Participation Quality:  Appropriate, Attentive, and Drowsy  Affect:  Angry and Appropriate  Cognitive:  Alert and Appropriate  Insight: Appropriate  Engagement in Group:  Engaged  Modes of Intervention:  Discussion  Additional Comments:   Pt was engaged during group discussion. Pt states that she had a good day because she was able to go to good groups and learn new things. Pt endorses slight feelings of anxiety but states that she is "okay". Pt states that one way that she plans on taking care of herself is through personal hygiene.  Vevelyn Pat 07/04/2022, 9:06 PM

## 2022-07-05 DIAGNOSIS — F333 Major depressive disorder, recurrent, severe with psychotic symptoms: Secondary | ICD-10-CM | POA: Diagnosis not present

## 2022-07-05 LAB — SARS CORONAVIRUS 2 BY RT PCR: SARS Coronavirus 2 by RT PCR: NEGATIVE

## 2022-07-05 NOTE — Group Note (Signed)
  [facility] LCSW Group Therapy Note  Date/Time:  07/05/2022 10:00AM-11:00AM  Type of Therapy and Topic:  Group Therapy:  Ways to Love Myself and Take Care of Myself  Participation Level:  Active   Description of Group This process group started with group leader playing a song entitled "Love Me More" to facilitate a discussion about the need to love and respect ourselves and prioritize taking care of ourselves, especially after hospital discharge.   There was a discussion about the need for self-love, and patients listed ways in which they can demonstrate self-love.  Patients were then asked to share how they plan to take care of themselves in a better manner when they get home from the hospital.  Group members shared ideas about making changes when they return home so that they can stay well and in recovery.  Two more songs were played including "I Am Enough" and "My Own Hero" which led to further reinforcement of the ideas given.  Patients were emotional and/or supportive of those who became emotional.  Therapeutic Goals Patient will listen and be able to relate to a song about prioritizing themselves through self-love Patient will participate in generating ideas about healthy self-care options when they return to the community Patients will be supportive of one another and receive said support from others   Summary of Patient Progress:  The patient expressed that one way she can demonstrate self-love both during hospital stay and after discharge is to use the resources she has been given here and to create more positive relationships in her life.  During group, patient did not visibly react to any of the songs, but when she was called on directly, she was prepared and gave logical, meaningful responses.   Therapeutic Modalities Activity Motivational Interviewing Processing   Ambrose Mantle, LCSW 07/05/2022, 12:00pm    m

## 2022-07-05 NOTE — Progress Notes (Signed)
Adult Psychoeducational Group Note  Date:  07/05/2022 Time:  11:02 PM  Group Topic/Focus:  Wrap-Up Group:   The focus of this group is to help patients review their daily goal of treatment and discuss progress on daily workbooks.  Participation Level:  Active  Participation Quality:  Appropriate and Attentive  Affect:  Appropriate  Cognitive:  Alert and Appropriate  Insight: Appropriate  Engagement in Group:  Engaged  Modes of Intervention:  Discussion  Additional Comments:   Pt was engaged and conversational during group. Pt states she had an okay day due to the fact she was able listen to music and watch movies. Pt denies everything. Pt stated that one way she wants to better herself is by being more expressive and communicating more.   Vevelyn Pat 07/05/2022, 11:02 PM

## 2022-07-05 NOTE — [facility] Group Notes (Signed)
Pt. Attended golds group. Pt. Stated that she will communicate more in groupe
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
## 2022-07-05 NOTE — Progress Notes (Signed)
   07/05/22 1300  Charting Type  Charting Type Shift assessment  Safety Check Verification  Has the RN verified the 15 minute safety check completion? Yes  Neurological  Neuro (WDL) WDL  HEENT  HEENT (WDL) WDL  Respiratory  Respiratory (WDL) WDL  Cardiac  Cardiac (WDL) WDL  Vascular  Vascular (WDL) WDL  Integumentary  Integumentary (WDL) WDL  Braden Scale (Ages 8 and up)  Sensory Perceptions 4  Moisture 4  Activity 4  Mobility 4  Nutrition 3  Friction and Shear 3  Braden Scale Score 22  Musculoskeletal  Musculoskeletal (WDL) WDL  Gastrointestinal  Gastrointestinal (WDL) WDL  GU Assessment  Genitourinary (WDL) WDL  Neurological  Level of Consciousness Alert

## 2022-07-06 ENCOUNTER — Encounter (HOSPITAL_COMMUNITY): Payer: Self-pay

## 2022-07-06 DIAGNOSIS — Z20822 Contact with and (suspected) exposure to covid-19: Secondary | ICD-10-CM | POA: Diagnosis not present

## 2022-07-06 DIAGNOSIS — F061 Catatonic disorder due to known physiological condition: Secondary | ICD-10-CM | POA: Diagnosis not present

## 2022-07-06 DIAGNOSIS — F333 Major depressive disorder, recurrent, severe with psychotic symptoms: Secondary | ICD-10-CM | POA: Diagnosis not present

## 2022-07-06 DIAGNOSIS — F983 Pica of infancy and childhood: Secondary | ICD-10-CM | POA: Insufficient documentation

## 2022-07-06 DIAGNOSIS — I10 Essential (primary) hypertension: Secondary | ICD-10-CM | POA: Diagnosis not present

## 2022-07-06 DIAGNOSIS — F251 Schizoaffective disorder, depressive type: Secondary | ICD-10-CM | POA: Diagnosis not present

## 2022-07-06 MED ORDER — RISPERIDONE 0.5 MG PO TABS
0.5000 mg | ORAL_TABLET | Freq: Every day | ORAL | Status: DC
  Filled 2022-07-06 (×2): qty 1

## 2022-07-06 NOTE — Progress Notes (Signed)
Nursing Note: 0700-1900  D:  Pt presents with depressed mood and flat affect, soft spoken does not initiate conversation but does answer questions Pt reports that she slept well last night, appetite is good and she is tolerating prescribed medication without side effects. Pt observed to be drooling at times, especially on her pillow case. She is on her menses and pt needs prompting for personal hygiene. Pt sits quietly in dayroom, no behavior problems observed.  A:  Pt. encouraged to verbalize needs and concerns, active listening and support provided.  Continued Q 15 minute safety checks.  Observed active participation in group settings.  R:  Pt. is pleasant and cooperative.  Denies A/V hallucinations and is able to verbally contract for safety.   07/06/22 0800  Psych Admission Type (Psych Patients Only)  Admission Status Voluntary  Psychosocial Assessment  Patient Complaints None  Eye Contact Fair  Facial Expression Flat  Affect Appropriate to circumstance  Speech Soft;Slow  Interaction Cautious  Motor Activity Slow  Appearance/Hygiene Unremarkable  Behavior Characteristics Cooperative;Appropriate to situation  Mood Pleasant;Depressed  Thought Process  Coherency Other (Comment) (Pt answers questions, minimal interaction otherwise.)  Content Preoccupation  Delusions Paranoid  Perception WDL  Hallucination None reported or observed  Judgment Poor  Confusion None  Danger to Self  Current suicidal ideation? Denies  Self-Injurious Behavior No self-injurious ideation or behavior indicators observed or expressed   Agreement Not to Harm Self Yes  Description of Agreement Verbal.  Danger to Others  Danger to Others None reported or observed

## 2022-07-06 NOTE — Progress Notes (Signed)
   07/05/22 2100  Psych Admission Type (Psych Patients Only)  Admission Status Voluntary  Psychosocial Assessment  Patient Complaints None  Eye Contact Fair  Facial Expression Flat  Affect Appropriate to circumstance  Speech Soft;Slow  Interaction Assertive;Cautious  Motor Activity Slow  Appearance/Hygiene Unremarkable  Behavior Characteristics Cooperative;Appropriate to situation  Mood Pleasant  Thought Process  Coherency Circumstantial  Content Preoccupation  Delusions Paranoid  Perception WDL  Hallucination None reported or observed  Judgment Poor  Confusion None  Danger to Self  Current suicidal ideation? Denies  Self-Injurious Behavior No self-injurious ideation or behavior indicators observed or expressed   Agreement Not to Harm Self Yes  Description of Agreement verbal  Danger to Others  Danger to Others None reported or observed
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
## 2022-07-06 NOTE — BH IP Treatment Plan (Signed)
Interdisciplinary Treatment and Diagnostic Plan Update  07/06/2022 Time of Session: update KIMBALL MANSKE MRN: [id number]  Principal Diagnosis: Schizoaffective disorder, depressive type [facility])  Secondary Diagnoses: Principal Problem:   Schizoaffective disorder, depressive type (HCC) Active Problems:   Insomnia   GAD (generalized anxiety disorder)   Vitamin D deficiency   Current Medications:  Current Facility-Administered Medications  Medication Dose Route Frequency Provider Last Rate Last Admin   acetaminophen (TYLENOL) tablet 500 mg  500 mg Oral Q6H PRN Massengill, Harrold Donath, MD       amLODipine (NORVASC) tablet 10 mg  10 mg Oral Daily Massengill, Nathan, MD   10 mg at 07/06/22 0758   antiseptic oral rinse (BIOTENE) solution 15 mL  15 mL Mouth Rinse 5 X Daily PRN Comer Locket, MD       atenolol (TENORMIN) tablet 25 mg  25 mg Oral Daily Massengill, Harrold Donath, MD   25 mg at 07/06/22 0758   cloZAPine (CLOZARIL) tablet 350 mg  350 mg Oral QHS Massengill, Harrold Donath, MD   350 mg at 07/05/22 2043   docusate sodium (COLACE) capsule 100 mg  100 mg Oral Daily Nkwenti, Tyler Aas, NP   100 mg at 07/06/22 0756   ferrous sulfate tablet 325 mg  325 mg Oral Daily Princess Bruins, DO   325 mg at 07/06/22 0756   hydrOXYzine (ATARAX) tablet 25 mg  25 mg Oral TID PRN Phineas Inches, MD   25 mg at 06/17/22 2027   OLANZapine zydis (ZYPREXA) disintegrating tablet 5 mg  5 mg Oral Q8H PRN Massengill, Harrold Donath, MD   5 mg at 05/24/22 2154   And   LORazepam (ATIVAN) tablet 1 mg  1 mg Oral Q8H PRN Massengill, Harrold Donath, MD   1 mg at 07/03/22 2150   And   ziprasidone (GEODON) injection 20 mg  20 mg Intramuscular Q8H PRN Massengill, Harrold Donath, MD       metFORMIN (GLUCOPHAGE-XR) 24 hr tablet 500 mg  500 mg Oral Q breakfast Massengill, Nathan, MD   500 mg at 07/06/22 0756   methylphenidate (RITALIN) tablet 5 mg  5 mg Oral Daily Massengill, Nathan, MD   5 mg at 07/06/22 0756   polyethylene glycol (MIRALAX / GLYCOLAX) packet 17 g  17  g Oral Daily Starleen Blue, NP   17 g at 07/06/22 0758   risperiDONE (RISPERDAL) tablet 0.5 mg  0.5 mg Oral Daily Starleen Blue, NP   0.5 mg at 07/06/22 0758   risperiDONE (RISPERDAL) tablet 1 mg  1 mg Oral QHS Nkwenti, Doris, NP   1 mg at 07/05/22 2044   sertraline (ZOLOFT) tablet 100 mg  100 mg Oral Daily Massengill, Harrold Donath, MD   100 mg at 07/06/22 6195   Vitamin D (Ergocalciferol) (DRISDOL) capsule 50,000 Units  50,000 Units Oral Q7 days Starleen Blue, NP   50,000 Units at 07/06/22 0757   PTA Medications: Medications Prior to Admission  Medication Sig Dispense Refill Last Dose   ferrous sulfate 325 (65 FE) MG tablet Take 325 mg by mouth daily.      risperiDONE (RISPERDAL) 1 MG tablet Take 1 tablet (1 mg total) by mouth daily.      risperiDONE (RISPERDAL) 2 MG tablet Take 1 tablet (2 mg total) by mouth at bedtime.       Patient Stressors: Health problems   Marital or family conflict   Medication change or noncompliance    Patient Strengths: Average or above average intelligence  Supportive family/friends   Treatment Modalities: Medication Management, Group  therapy, Case management,  1 to 1 session with clinician, Psychoeducation, Recreational therapy.   Physician Treatment Plan for Primary Diagnosis: Schizoaffective disorder, depressive type (HCC) Long Term Goal(s): Improvement in symptoms so as ready for discharge   Short Term Goals: Ability to verbalize feelings will improve Ability to disclose and discuss suicidal ideas Ability to identify and develop effective coping behaviors will improve Compliance with prescribed medications will improve  Medication Management: Evaluate patient's response, side effects, and tolerance of medication regimen.  Therapeutic Interventions: 1 to 1 sessions, Unit Group sessions and Medication administration.  Evaluation of Outcomes: Not Progressing  Physician Treatment Plan for Secondary Diagnosis: Principal Problem:   Schizoaffective  disorder, depressive type (HCC) Active Problems:   Insomnia   GAD (generalized anxiety disorder)   Vitamin D deficiency  Long Term Goal(s): Improvement in symptoms so as ready for discharge   Short Term Goals: Ability to verbalize feelings will improve Ability to disclose and discuss suicidal ideas Ability to identify and develop effective coping behaviors will improve Compliance with prescribed medications will improve     Medication Management: Evaluate patient's response, side effects, and tolerance of medication regimen.  Therapeutic Interventions: 1 to 1 sessions, Unit Group sessions and Medication administration.  Evaluation of Outcomes: Not Progressing   RN Treatment Plan for Primary Diagnosis: Schizoaffective disorder, depressive type (HCC) Long Term Goal(s): Knowledge of disease and therapeutic regimen to maintain health will improve  Short Term Goals: Ability to remain free from injury will improve, Ability to verbalize frustration and anger appropriately will improve, Ability to demonstrate self-control, Ability to participate in decision making will improve, Ability to verbalize feelings will improve, Ability to disclose and discuss suicidal ideas, Ability to identify and develop effective coping behaviors will improve, and Compliance with prescribed medications will improve  Medication Management: RN will administer medications as ordered by provider, will assess and evaluate patient's response and provide education to patient for prescribed medication. RN will report any adverse and/or side effects to prescribing provider.  Therapeutic Interventions: 1 on 1 counseling sessions, Psychoeducation, Medication administration, Evaluate responses to treatment, Monitor vital signs and CBGs as ordered, Perform/monitor CIWA, COWS, AIMS and Fall Risk screenings as ordered, Perform wound care treatments as ordered.  Evaluation of Outcomes: Progressing   LCSW Treatment Plan for  Primary Diagnosis: Schizoaffective disorder, depressive type (HCC) Long Term Goal(s): Safe transition to appropriate next level of care at discharge, Engage patient in therapeutic group addressing interpersonal concerns.  Short Term Goals: Engage patient in aftercare planning with referrals and resources, Increase social support, Increase ability to appropriately verbalize feelings, Increase emotional regulation, Facilitate acceptance of mental health diagnosis and concerns, Facilitate patient progression through stages of change regarding substance use diagnoses and concerns, Identify triggers associated with mental health/substance abuse issues, and Increase skills for wellness and recovery  Therapeutic Interventions: Assess for all discharge needs, 1 to 1 time with Social worker, Explore available resources and support systems, Assess for adequacy in community support network, Educate family and significant other(s) on suicide prevention, Complete Psychosocial Assessment, Interpersonal group therapy.  Evaluation of Outcomes: Not Progressing   Progress in Treatment: Attending groups: Yes. Participating in groups: Yes. Taking medication as prescribed: Yes. Toleration medication: Yes. Family/Significant other contact made: Yes, individual(s) contacted:  mother Patient understands diagnosis: No. Discussing patient identified problems/goals with staff: Yes. Medical problems stabilized or resolved: Yes. Denies suicidal/homicidal ideation: Yes. Issues/concerns per patient self-inventory: No.   New problem(s) identified: No, Describe:  none reported  New Short Term/Long Term  Goal(s):   medication stabilization, elimination of SI thoughts, development of comprehensive mental wellness plan.    Patient Goals:  Pt will continue to work on goals stated in initial treatment team.  CSW will continue to assess and assist patient with interventions to accomplish goals  Discharge Plan or Barriers:  Paranoid, patient will not sign documents for services.  Adcovating for emergent guardianship so that patient can have a safe discharge.  ACTT referral completed.   Reason for Continuation of Hospitalization: Delusions  Medication stabilization Other; describe paranoia  Estimated Length of Stay: 5-7 days  Last 3 [place] Suicide Severity Risk Score: [place] Admission (Current) from 05/19/2022 in [facility] ED from 05/18/2022 in [facility] Office Visit from 04/14/2022 in [place] No Risk High Risk No Risk       Last PHQ 2/9 Scores:    04/14/2022   10:46 AM 02/24/2022   11:12 AM 01/13/2022    9:14 AM  Depression screen PHQ 2/9  Decreased Interest 0 0 0  Down, Depressed, Hopeless 0 0 0  PHQ - 2 Score 0 0 0    Scribe for Treatment Team: Zachery Conch, LCSW 07/06/2022 10:33 AM

## 2022-07-06 NOTE — Progress Notes (Signed)
Adult Psychoeducational Group Note  Date:  07/06/2022 Time:  8:59 PM  Group Topic/Focus:  Wrap-Up Group:   The focus of this group is to help patients review their daily goal of treatment and discuss progress on daily workbooks.  Participation Level:  Active  Participation Quality:  Appropriate  Affect:  Appropriate  Cognitive:  Appropriate  Insight: Appropriate  Engagement in Group:  Engaged  Modes of Intervention:  Discussion  Additional Comments:   Pt states that she had a good day and was able to go to group therapy. Pt was more quiet today but did state that she was still working on being more expressive when she communicates.   Vevelyn Pat 07/06/2022, 8:59 PM

## 2022-07-06 NOTE — BHH Counselor (Signed)
CSW spoke with Cheryl Adams [phone] (Mother) who states that she has picked up the paperwork for Guardianship and is filling it out tonight.  She states that she will return the paperwork to the [place] [employer] and pay the fee to begin the process.  She states that she would like to see if her daughter is open to a family meeting with her and [name] to see if they can talk to her about where she wants to live and how they can help her achieve that goal.  She states "She does not have to come home but we want her to live somewhere she can feel safe".  [name] states that she also will not be asking Christella to return to her job if she does not want too.  Mrs. Wander states that she is concerned about [name] being placed back on Risperdal due to [name] previously having side effects while taking this medication.  She states that she would like her daughter to go to a group home.  CSW explained the process for getting into a group home and that at this time [name] does not appear interested in going to a group home.  [name] states that she will work on looking for a group home after she receives legal guardianship.  [name] states that on August 17th, 2023 her daughter's medical insurance will change to [employer].  She states that after completing the Guardianship paperwork tomorrow she will bring a copy of the new insurance card to the hospital to be scanned into the system.  CSW will speak with Mrs. Vardaman again tomorrow about any additional Guardianship information that is received.

## 2022-07-06 NOTE — B[facility] Group Notes (Signed)
B[facility] Group Notes:  (Nursing/MHT/Case Management/Adjunct)  Date:  07/06/2022  Time:  10:07 AM  Type of Therapy:  Psychoeducational Skills  Participation Level:  Active  Participation Quality:  Appropriate and Attentive  Affect:  Appropriate  Cognitive:  Alert, Appropriate, and Oriented  Insight:  Appropriate, Good, and Improving  Engagement in Group:  Improving and Supportive  Modes of Intervention:  Discussion, Orientation, and Support  Summary of Progress/Problems: Discussed orientation, rules/protocols for the unit.  Patient  was supportive and attentive.     Cheryl Adams 07/06/2022, 10:07 AM

## 2022-07-06 NOTE — Group Note (Signed)
LCSW Group Therapy Note   Group Date: 07/06/2022 Start Time: 1300 End Time: 1400   Type of Therapy and Topic:  Group Therapy: Boundaries  Participation Level:  Active  Description of Group: This group will address the use of boundaries in their personal lives. Patients will explore why boundaries are important, the difference between healthy and unhealthy boundaries, and negative and postive outcomes of different boundaries and will look at how boundaries can be crossed.  Patients will be encouraged to identify current boundaries in their own lives and identify what kind of boundary is being set. Facilitators will guide patients in utilizing problem-solving interventions to address and correct types boundaries being used and to address when no boundary is being used. Understanding and applying boundaries will be explored and addressed for obtaining and maintaining a balanced life. Patients will be encouraged to explore ways to assertively make their boundaries and needs known to significant others in their lives, using other group members and facilitator for role play, support, and feedback.  Therapeutic Goals:  1.  Patient will identify areas in their life where setting clear boundaries could be  used to improve their life.  2.  Patient will identify signs/triggers that a boundary is not being respected. 3.  Patient will identify two ways to set boundaries in order to achieve balance in  their lives: 4.  Patient will demonstrate ability to communicate their needs and set boundaries  through discussion and/or role plays  Summary of Patient Progress:  Judine was present/active throughout the session and proved open to feedback from CSW and peers. Patient demonstrated good insight into the subject matter, was respectful of peers, and was present throughout the entire session.  Therapeutic Modalities:   Cognitive Behavioral Therapy Solution-Focused Therapy  Beatris Si, LCSWA 07/06/2022   2:40 PM

## 2022-07-07 DIAGNOSIS — F333 Major depressive disorder, recurrent, severe with psychotic symptoms: Secondary | ICD-10-CM | POA: Diagnosis not present

## 2022-07-07 LAB — CBC WITH DIFFERENTIAL/PLATELET
Abs Immature Granulocytes: 0.02 10*3/uL (ref 0.00–0.07)
Basophils Absolute: 0 10*3/uL (ref 0.0–0.1)
Basophils Relative: 0 %
Eosinophils Absolute: 0.2 10*3/uL (ref 0.0–0.5)
Eosinophils Relative: 4 %
HCT: 41.3 % (ref 36.0–46.0)
Hemoglobin: 13.1 g/dL (ref 12.0–15.0)
Immature Granulocytes: 0 %
Lymphocytes Relative: 28 %
Lymphs Abs: 1.3 10*3/uL (ref 0.7–4.0)
MCH: 26.3 pg (ref 26.0–34.0)
MCHC: 31.7 g/dL (ref 30.0–36.0)
MCV: 82.8 fL (ref 80.0–100.0)
Monocytes Absolute: 0.3 10*3/uL (ref 0.1–1.0)
Monocytes Relative: 7 %
Neutro Abs: 2.8 10*3/uL (ref 1.7–7.7)
Neutrophils Relative %: 61 %
Platelets: 262 10*3/uL (ref 150–400)
RBC: 4.99 MIL/uL (ref 3.87–5.11)
RDW: 13.7 % (ref 11.5–15.5)
WBC: 4.7 10*3/uL (ref 4.0–10.5)
nRBC: 0 % (ref 0.0–0.2)

## 2022-07-07 MED ORDER — CLOZAPINE 100 MG PO TABS
300.0000 mg | ORAL_TABLET | Freq: Every day | ORAL | Status: DC
  Administered 2022-07-07 – 2022-07-09 (×3): 300 mg via ORAL
  Filled 2022-07-07 (×4): qty 3

## 2022-07-07 NOTE — Progress Notes (Signed)
   07/06/22 2200  Psych Admission Type (Psych Patients Only)  Admission Status Voluntary  Psychosocial Assessment  Patient Complaints None  Eye Contact Fair  Facial Expression Blank  Affect Appropriate to circumstance  Speech Soft  Interaction Cautious  Motor Activity Slow  Appearance/Hygiene Unremarkable  Behavior Characteristics Cooperative;Appropriate to situation  Mood Pleasant  Thought Process  Coherency Blocking  Content Preoccupation  Delusions Paranoid  Perception WDL  Hallucination None reported or observed  Judgment Poor  Confusion None  Danger to Self  Current suicidal ideation? Denies  Self-Injurious Behavior No self-injurious ideation or behavior indicators observed or expressed   Agreement Not to Harm Self Yes  Description of Agreement verbal  Danger to Others  Danger to Others None reported or observed

## 2022-07-07 NOTE — BHH Counselor (Signed)
CSW spoke with Mrs. Kaoru Benda who brought her daughter's new insurance cards to the hospital to be scanned into the system.  She states that she has completed Guardianship paperwork and that the court date is set for 09/29/2022 at 11:00am.  She states that her daughter's uncle is still willing to help her daughter get her own apartment and would like to know if this is something she would want.  She states that her daughter also missed Jury Duty while at the hospital.  Mrs. Kovack states that she will contact the CSW if a letter is needed to excuse her daughter from [employer].   CSW spoke with the Pt who states that she does not remember her uncle Bethann Berkshire.  CSW asked the Pt where she would like to live after discharge and the Pt states "a group home or a shelter".  CSW asked the Pt if she would be willing to accept someone helping her to get an apartment of her own.  The Pt states that she is not sure yet.  The Pt continues to refuse phone calls or visits from her mother and sister at this time.

## 2022-07-07 NOTE — Progress Notes (Signed)
   07/07/22 2346  Psych Admission Type (Psych Patients Only)  Admission Status Voluntary  Psychosocial Assessment  Patient Complaints None  Eye Contact Brief  Facial Expression Blank  Affect Flat  Speech Logical/coherent  Interaction Cautious  Motor Activity Slow  Appearance/Hygiene Unremarkable  Behavior Characteristics Cooperative  Mood Pleasant  Thought Process  Coherency Blocking  Content Preoccupation  Delusions Paranoid  Perception WDL  Hallucination None reported or observed  Judgment Poor  Confusion None  Danger to Self  Current suicidal ideation? Denies  Self-Injurious Behavior No self-injurious ideation or behavior indicators observed or expressed   Agreement Not to Harm Self Yes  Description of Agreement verbal  Danger to Others  Danger to Others None reported or observed   D: Patient sitting quietly in dayroom. Pt denies SI/HI. Pt presents with depressed mood and flat affect.  A: Medications administered as prescribed. Support and encouragement provided as needed.  R: Patient remains safe on the unit. Will continue to monitor for safety and stability.
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
## 2022-07-07 NOTE — [facility] Group Notes (Signed)
Patient attended the Pharmacy group. 

## 2022-07-07 NOTE — Progress Notes (Signed)
Pt denies SI/HI/AVH and verbally agrees to approach staff if these become apparent or before harming themselves/others. Rates depression 0/10. Rates anxiety 0/10. Rates pain 0/10. Pt was sitting outside of dayroom while it was closed. Pt seems to have more attention span when in the dayroom. Pt is not sleeping as much during the day. Scheduled medications administered to pt, per MD orders. RN provided support and encouragement to pt. Q15 min safety checks implemented and continued. Pt safe on the unit. RN will continue to monitor and intervene as needed.   07/07/22 0754  Psych Admission Type (Psych Patients Only)  Admission Status Voluntary  Psychosocial Assessment  Patient Complaints None  Eye Contact Poor;Avoids  Facial Expression Blank;Flat  Affect Appropriate to circumstance  Speech Logical/coherent;Soft;Slow  Interaction Cautious  Motor Activity Slow  Appearance/Hygiene Unremarkable  Behavior Characteristics Cooperative;Appropriate to situation;Guarded  Mood Pleasant  Thought Process  Coherency Blocking  Content Preoccupation  Delusions Paranoid  Perception WDL  Hallucination None reported or observed  Judgment Poor  Confusion None  Danger to Self  Current suicidal ideation? Denies  Danger to Others  Danger to Others None reported or observed

## 2022-07-08 DIAGNOSIS — F251 Schizoaffective disorder, depressive type: Secondary | ICD-10-CM | POA: Diagnosis not present

## 2022-07-08 LAB — CLOZAPINE (CLOZARIL)
Clozapine Lvl: 296 ng/mL — ABNORMAL LOW (ref 350–600)
NorClozapine: 131 ng/mL
Total(Cloz+Norcloz): 427 ng/mL

## 2022-07-08 NOTE — Group Note (Signed)
[facility] LCSW Group Therapy   Type of Therapy and Topic:  Group Therapy:  Wellness   Participation Level: Active  Description of Group: This group allows individuals to explore the 6 dimensions of wellness, including spiritual, emotional, intellectual, physical, social, environmental, financial and spiritual. Patients will learn to different ways to practice wellness to improve well-being. Patients also participated in a conversation about what wellness means to them.   Individuals will think about ways in which they currently practice wellness as well as ways they can improve their wellness and new ways to practice wellness.       Therapeutic Goals Patient will verbalize 1 or 2 wellness areas where they are doing well. Patient will identify 2 areas where they would like to improve their wellness.   Patient will provide a definition of what wellness means to them.  Patients will reflect on current hospitalization and primary areas to maintain mental health to prevent re-hospitalization.     Summary of Patient Progress:  Patient was appropriate in group and participated well. Patient states that she meditates to stay well  and lessen her anxiety.  Patient had good insight into group topic and participated appropriately in group activity.       Therapeutic Modalities Cognitive Behavioral Therapy Motivational Interviewing

## 2022-07-08 NOTE — Progress Notes (Signed)
The patient stated that she had a good day overall because she liked the groups and went outside.

## 2022-07-08 NOTE — Progress Notes (Signed)
   07/08/22 1000  Psych Admission Type (Psych Patients Only)  Admission Status Voluntary  Psychosocial Assessment  Patient Complaints None  Eye Contact Avoids  Facial Expression Blank  Affect Flat  Speech Logical/coherent;Soft;Slow  Interaction Cautious  Motor Activity Slow  Appearance/Hygiene Unremarkable  Behavior Characteristics Cooperative;Appropriate to situation;Calm  Mood Pleasant  Thought Process  Coherency Blocking  Content Preoccupation  Delusions Paranoid  Perception WDL  Hallucination None reported or observed  Judgment Poor  Confusion None  Danger to Self  Current suicidal ideation? Denies  Self-Injurious Behavior No self-injurious ideation or behavior indicators observed or expressed   Agreement Not to Harm Self Yes  Description of Agreement Verbal  Danger to Others  Danger to Others None reported or observed
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
## 2022-07-08 NOTE — Progress Notes (Signed)
   07/08/22 2030  Psych Admission Type (Psych Patients Only)  Admission Status Voluntary  Psychosocial Assessment  Patient Complaints None  Eye Contact Avoids  Facial Expression Flat  Affect Flat  Speech Logical/coherent  Interaction Cautious  Motor Activity Slow  Appearance/Hygiene Improved (must prompt to get pt to attend to hygiene needs)  Behavior Characteristics Cooperative;Appropriate to situation  Mood Pleasant  Thought Process  Coherency Blocking  Content Preoccupation  Delusions Paranoid  Perception WDL  Hallucination None reported or observed  Judgment Poor  Confusion None  Danger to Self  Current suicidal ideation? Denies  Danger to Others  Danger to Others None reported or observed   Progress note   D: Pt seen in dayroom. Pt denies SI, HI, AVH. Pt rates pain  0/10. Pt rates anxiety  0/10 and depression  0/10. Pt avoids eye contact. Pt endorses good sleep and group attendance. Appetite is good. Pt still needs prompting to attend to hygiene especially during her menses which is occurring now. Pt reminded to change her hygiene products before bed. Pt verbalized understanding. Pt states she is ready for discharge. Says the first thing she will do is go shopping. Pt compliant with medications.  A: Pt provided support and encouragement. Pt given scheduled medication as prescribed. PRNs as appropriate. Q15 min checks for safety.   R: Pt safe on the unit. Will continue to monitor.

## 2022-07-09 DIAGNOSIS — F251 Schizoaffective disorder, depressive type: Secondary | ICD-10-CM | POA: Diagnosis not present

## 2022-07-09 MED ORDER — METHYLPHENIDATE HCL 10 MG PO TABS
10.0000 mg | ORAL_TABLET | Freq: Every day | ORAL | Status: DC
  Administered 2022-07-10 – 2022-09-03 (×56): 10 mg via ORAL
  Filled 2022-07-09 (×58): qty 1
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
## 2022-07-09 NOTE — Progress Notes (Signed)
The patient rated her day as a 4 out of 10 because she "meditated" today. Her positive event for the day was that she enjoyed the meals.

## 2022-07-09 NOTE — Progress Notes (Signed)
   07/09/22 2200  Psych Admission Type (Psych Patients Only)  Admission Status Voluntary  Psychosocial Assessment  Patient Complaints None  Eye Contact Brief  Facial Expression Flat  Affect Flat  Speech Soft;Slow  Interaction Cautious  Motor Activity Slow  Appearance/Hygiene Disheveled  Behavior Characteristics Cooperative;Appropriate to situation  Mood Pleasant  Thought Process  Coherency WDL  Content WDL  Delusions None reported or observed  Perception WDL  Hallucination None reported or observed  Judgment Poor  Confusion None  Danger to Self  Current suicidal ideation? Denies  Self-Injurious Behavior No self-injurious ideation or behavior indicators observed or expressed   Agreement Not to Harm Self Yes  Description of Agreement verbal  Danger to Others  Danger to Others None reported or observed

## 2022-07-09 NOTE — Group Note (Signed)
Occupational Therapy Group Note   Group Topic:Goal Setting  Group Date: 07/09/2022 Start Time: 1400 End Time: 1500 Facilitators: Ted Mcalpine, OT   Group Description: Group encouraged engagement and participation through discussion focused on goal setting. Group members were introduced to goal-setting using the SMART Goal framework, identifying goals as Specific, Measureable, Acheivable, Relevant, and Time-Bound. Group members took time from group to create their own personal goal reflecting the SMART goal template and shared for review by peers and OT.    Therapeutic Goal(s):  Identify at least one goal that fits the SMART framework    Participation Level: Minimal   Participation Quality: Minimal Cues   Behavior: Alert and Appropriate   Speech/Thought Process: Focused   Affect/Mood: Appropriate   Insight: Fair   Judgement: Fair   Individualization: pt was active in their participation of group discussion/activity. New skills identified  Modes of Intervention: Discussion and Education  Patient Response to Interventions:  Attentive   Plan: Continue to engage patient in OT groups 2 - 3x/week.  07/09/2022  Ted Mcalpine, OT Kerrin Champagne, OT

## 2022-07-10 ENCOUNTER — Encounter (HOSPITAL_COMMUNITY): Payer: Self-pay

## 2022-07-10 DIAGNOSIS — F333 Major depressive disorder, recurrent, severe with psychotic symptoms: Secondary | ICD-10-CM | POA: Diagnosis not present

## 2022-07-10 MED ORDER — CLOZAPINE 25 MG PO TABS
250.0000 mg | ORAL_TABLET | Freq: Every day | ORAL | Status: DC
  Administered 2022-07-10 – 2022-07-14 (×5): 250 mg via ORAL
  Filled 2022-07-10 (×7): qty 2

## 2022-07-10 NOTE — Progress Notes (Signed)
Adult Psychoeducational Group Note  Date:  07/10/2022 Time:  8:50 PM  Group Topic/Focus:  Wrap-Up Group:   The focus of this group is to help patients review their daily goal of treatment and discuss progress on daily workbooks.  Participation Level:  Active  Participation Quality:  Appropriate  Affect:  Appropriate  Cognitive:  Appropriate  Insight: Appropriate  Engagement in Group:  Engaged  Modes of Intervention:  Discussion  Additional Comments:  Pt stated she had an okay day.  Pt stated her goal for the day was to go to groups. Pt met goal.  Elise Benne 07/10/2022, 8:50 PM

## 2022-07-10 NOTE — Progress Notes (Signed)
   07/10/22 2141  Psych Admission Type (Psych Patients Only)  Admission Status Voluntary  Psychosocial Assessment  Patient Complaints Other (Comment) (lightheaded)  Eye Contact Fair  Facial Expression Flat  Affect Preoccupied  Speech Soft  Interaction Minimal  Motor Activity Slow  Appearance/Hygiene Unremarkable  Behavior Characteristics Cooperative  Mood Suspicious  Thought Process  Coherency Circumstantial  Content Paranoia  Delusions Paranoid  Perception Hallucinations  Hallucination Auditory  Judgment Limited  Danger to Self  Current suicidal ideation? Denies  Self-Injurious Behavior No self-injurious ideation or behavior indicators observed or expressed   Agreement Not to Harm Self Yes  Description of Agreement verbal  Danger to Others  Danger to Others None reported or observed   D: Patient in the dayroom most of the evening watching TV. Pt expresses no other needs at this time.  A: Medications administered as prescribed. Support and encouragement provided as needed.  R: Patient remains safe on the unit. Plan of care ongoing for safety and stability.

## 2022-07-10 NOTE — Progress Notes (Signed)
SPIRITUALITY GROUP NOTE  Spirituality group facilitated by Wilkie Aye, MDiv, BCC.  Group Description: Group focused on topic of hope. Patients participated in facilitated discussion around topic, connecting with one another around experiences and definitions for hope. Group members engaged with visual explorer photos, reflecting on what hope looks like for them today. Group engaged in discussion around how their definitions of hope are present today in hospital.  Modalities: Psycho-social ed, Adlerian, Narrative, MI  Patient Progress: Present throughout group.  Named awareness of hope as "embracing the journey and the unknown, setting healthy boundaries, prioritizing what I choose to give my energy to"  Engaged with others around how we connected to sources of hope and meaning.

## 2022-07-10 NOTE — Group Note (Signed)
LCSW Aftercare Discharge Planning Group Note   Type of Group and Topic: Psychoeducational Group: Discharge Planning  Participation Level: Minimal  Description of Group  Discharge planning group reviews patient's anticipated discharge plans and assists patients to anticipate and address any barriers to wellness/recovery in the community. Suicide prevention education is reviewed with patients in group.  Therapeutic Goals  1. Patients will state their anticipated discharge plan and mental health aftercare  2. Patients will identify potential barriers to wellness in the community setting  3. Patients will engage in problem solving, solution focused discussion of ways to anticipate and address barriers to wellness/recovery  Summary of Patient Progress: Patient participates minimally in group. Patient states that she would like to change her communication skills and talk more with a counselor upon discharge.  Patient was minimal in group discussion.    Therapeutic Modalities:  Motivational Interviewing   Masin Shatto, LCSW, LCAS Clincal Social Worker  [facility]

## 2022-07-10 NOTE — [facility] Group Notes (Signed)
[facility] Group Notes:  (Nursing/MHT/Case Management/Adjunct)  Date:  07/10/2022  Time:  10:10 AM  Type of Therapy:   Orientation/Goals group  Participation Level:  Active  Participation Quality:  Appropriate and Attentive  Affect:  Appropriate  Cognitive:  Appropriate  Insight:  Improving  Engagement in Group:  Engaged and Improving  Modes of Intervention:  Discussion, Education, Orientation, and Support  Summary of Progress/Problems: Pt goal for today is to continue to get better and hope the social worker finds a good group home for her.   Corianna Avallone J Denelda Akerley 07/10/2022, 10:10 AM

## 2022-07-10 NOTE — BH IP Treatment Plan (Signed)
Interdisciplinary Treatment and Diagnostic Plan Update  07/10/2022 Time of Session: 0830 Cheryl Adams MRN: [id number]  Principal Diagnosis: Schizoaffective disorder, depressive type [facility])  Secondary Diagnoses: Principal Problem:   Schizoaffective disorder, depressive type (HCC) Active Problems:   Insomnia   GAD (generalized anxiety disorder)   Vitamin D deficiency   Current Medications:  Current Facility-Administered Medications  Medication Dose Route Frequency Provider Last Rate Last Admin   acetaminophen (TYLENOL) tablet 500 mg  500 mg Oral Q6H PRN Massengill, Harrold Donath, MD       amLODipine (NORVASC) tablet 10 mg  10 mg Oral Daily Massengill, Harrold Donath, MD   10 mg at 07/10/22 0811   antiseptic oral rinse (BIOTENE) solution 15 mL  15 mL Mouth Rinse 5 X Daily PRN Comer Locket, MD       atenolol (TENORMIN) tablet 25 mg  25 mg Oral Daily Massengill, Harrold Donath, MD   25 mg at 07/10/22 8768   cloZAPine (CLOZARIL) tablet 250 mg  250 mg Oral QHS Hill, Shelbie Hutching, MD       docusate sodium (COLACE) capsule 100 mg  100 mg Oral Daily Nkwenti, Doris, NP   100 mg at 07/10/22 1157   ferrous sulfate tablet 325 mg  325 mg Oral Daily Princess Bruins, DO   325 mg at 07/10/22 2620   hydrOXYzine (ATARAX) tablet 25 mg  25 mg Oral TID PRN Phineas Inches, MD   25 mg at 06/17/22 2027   OLANZapine zydis (ZYPREXA) disintegrating tablet 5 mg  5 mg Oral Q8H PRN Massengill, Harrold Donath, MD   5 mg at 05/24/22 2154   And   LORazepam (ATIVAN) tablet 1 mg  1 mg Oral Q8H PRN Massengill, Harrold Donath, MD   1 mg at 07/03/22 2150   And   ziprasidone (GEODON) injection 20 mg  20 mg Intramuscular Q8H PRN Massengill, Harrold Donath, MD       metFORMIN (GLUCOPHAGE-XR) 24 hr tablet 500 mg  500 mg Oral Q breakfast Massengill, Harrold Donath, MD   500 mg at 07/10/22 0811   methylphenidate (RITALIN) tablet 10 mg  10 mg Oral Daily Roselle Locus, MD   10 mg at 07/10/22 0811   polyethylene glycol (MIRALAX / GLYCOLAX) packet 17 g  17 g Oral Daily  Starleen Blue, NP   17 g at 07/10/22 0810   sertraline (ZOLOFT) tablet 100 mg  100 mg Oral Daily Massengill, Harrold Donath, MD   100 mg at 07/10/22 3559   Vitamin D (Ergocalciferol) (DRISDOL) capsule 50,000 Units  50,000 Units Oral Q7 days Starleen Blue, NP   50,000 Units at 07/06/22 0757   PTA Medications: Medications Prior to Admission  Medication Sig Dispense Refill Last Dose   ferrous sulfate 325 (65 FE) MG tablet Take 325 mg by mouth daily.      risperiDONE (RISPERDAL) 1 MG tablet Take 1 tablet (1 mg total) by mouth daily.      risperiDONE (RISPERDAL) 2 MG tablet Take 1 tablet (2 mg total) by mouth at bedtime.       Patient Stressors: Health problems   Marital or family conflict   Medication change or noncompliance    Patient Strengths: Average or above average intelligence  Supportive family/friends   Treatment Modalities: Medication Management, Group therapy, Case management,  1 to 1 session with clinician, Psychoeducation, Recreational therapy.   Physician Treatment Plan for Primary Diagnosis: Schizoaffective disorder, depressive type (HCC) Long Term Goal(s): Improvement in symptoms so as ready for discharge   Short Term Goals: Ability to verbalize  feelings will improve Ability to disclose and discuss suicidal ideas Ability to identify and develop effective coping behaviors will improve Compliance with prescribed medications will improve  Medication Management: Evaluate patient's response, side effects, and tolerance of medication regimen.  Therapeutic Interventions: 1 to 1 sessions, Unit Group sessions and Medication administration.  Evaluation of Outcomes: Progressing  Physician Treatment Plan for Secondary Diagnosis: Principal Problem:   Schizoaffective disorder, depressive type (HCC) Active Problems:   Insomnia   GAD (generalized anxiety disorder)   Vitamin D deficiency  Long Term Goal(s): Improvement in symptoms so as ready for discharge   Short Term Goals:  Ability to verbalize feelings will improve Ability to disclose and discuss suicidal ideas Ability to identify and develop effective coping behaviors will improve Compliance with prescribed medications will improve     Medication Management: Evaluate patient's response, side effects, and tolerance of medication regimen.  Therapeutic Interventions: 1 to 1 sessions, Unit Group sessions and Medication administration.  Evaluation of Outcomes: Progressing   RN Treatment Plan for Primary Diagnosis: Schizoaffective disorder, depressive type (HCC) Long Term Goal(s): Knowledge of disease and therapeutic regimen to maintain health will improve  Short Term Goals: Ability to remain free from injury will improve, Ability to verbalize frustration and anger appropriately will improve, Ability to demonstrate self-control, Ability to participate in decision making will improve, Ability to verbalize feelings will improve, Ability to disclose and discuss suicidal ideas, Ability to identify and develop effective coping behaviors will improve, and Compliance with prescribed medications will improve  Medication Management: RN will administer medications as ordered by provider, will assess and evaluate patient's response and provide education to patient for prescribed medication. RN will report any adverse and/or side effects to prescribing provider.  Therapeutic Interventions: 1 on 1 counseling sessions, Psychoeducation, Medication administration, Evaluate responses to treatment, Monitor vital signs and CBGs as ordered, Perform/monitor CIWA, COWS, AIMS and Fall Risk screenings as ordered, Perform wound care treatments as ordered.  Evaluation of Outcomes: Progressing   LCSW Treatment Plan for Primary Diagnosis: Schizoaffective disorder, depressive type (HCC) Long Term Goal(s): Safe transition to appropriate next level of care at discharge, Engage patient in therapeutic group addressing interpersonal  concerns.  Short Term Goals: Engage patient in aftercare planning with referrals and resources, Increase social support, Increase ability to appropriately verbalize feelings, Increase emotional regulation, Facilitate acceptance of mental health diagnosis and concerns, Facilitate patient progression through stages of change regarding substance use diagnoses and concerns, Identify triggers associated with mental health/substance abuse issues, and Increase skills for wellness and recovery  Therapeutic Interventions: Assess for all discharge needs, 1 to 1 time with Social worker, Explore available resources and support systems, Assess for adequacy in community support network, Educate family and significant other(s) on suicide prevention, Complete Psychosocial Assessment, Interpersonal group therapy.  Evaluation of Outcomes: Progressing   Progress in Treatment: Attending groups: Yes. Participating in groups: Yes. Taking medication as prescribed: Yes. Toleration medication: Yes. Family/Significant other contact made: Yes, individual(s) contacted:  Sherlean Foot, mother Patient understands diagnosis: Yes. Discussing patient identified problems/goals with staff: Yes. Medical problems stabilized or resolved: Yes. Denies suicidal/homicidal ideation: No. Issues/concerns per patient self-inventory: Yes. Other: none  New problem(s) identified: No, Describe:  none  New Short Term/Long Term Goal(s):Patient to work towards elimination of symptoms of psychosis, medication management for mood stabilization; development of comprehensive mental wellness plan.  Patient Goals:  No additional goals identified at this time. Patient to continue to work towards original goals identified in initial treatment team meeting. CSW  will remain available to patient should they voice additional treatment goals.   Discharge Plan or Barriers: Patient continues to be paranoid of others in her home prior to admission. Patient  may discharge to shelter resources.   Reason for Continuation of Hospitalization: Other; describe psychosis   Estimated Length of Stay: 1-7 days   Last 3 [place] Suicide Severity Risk Score: Flowsheet Row [facility] ([facility] Visit from 04/14/2022 in [facility]  C-SSRS RISK CATEGORY No Risk High Risk No Risk       Last PHQ 2/9 Scores:    04/14/2022   10:46 AM 02/24/2022   11:12 AM 01/13/2022    9:14 AM  Depression screen PHQ 2/9  Decreased Interest 0 0 0  Down, Depressed, Hopeless 0 0 0  PHQ - 2 Score 0 0 0    Scribe for Treatment Team: Almedia Balls 07/10/2022 2:53 PM

## 2022-07-10 NOTE — Progress Notes (Signed)
   07/10/22 1000  Psych Admission Type (Psych Patients Only)  Admission Status Voluntary  Psychosocial Assessment  Patient Complaints None  Eye Contact Brief  Facial Expression Flat  Affect Preoccupied  Speech Soft  Interaction Minimal  Motor Activity Slow  Appearance/Hygiene Unremarkable  Behavior Characteristics Cooperative  Mood Suspicious  Thought Process  Coherency Circumstantial  Content Paranoia  Delusions Paranoid  Perception WDL  Hallucination None reported or observed  Judgment Limited  Confusion None  Danger to Self  Current suicidal ideation? Denies  Danger to Others  Danger to Others None reported or observed
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
## 2022-07-11 NOTE — B[facility] Group Notes (Signed)
Goals Group 78/2023   Group Focus: affirmation, clarity of thought, and goals/reality orientation Treatment Modality:  Psychoeducation Interventions utilized were assignment, group exercise, and support Purpose: To be able to understand and verbalize the reason for their admission to the hospital. To understand that the medication helps with their chemical imbalance but they also need to work on their choices in life. To be challenged to develop a list of 30 positives about themselves. Also introduce the concept that "feelings" are not reality.  Participation Level:  Active  Participation Quality:  Appropriate  Affect:  Appropriate  Cognitive:  Appropriate  Insight:  Improving  Engagement in Group:  Engaged  Additional Comments:  Pt rates her energy at a 4/10. Participated fully in the group   Cheryl Adams A

## 2022-07-11 NOTE — Group Note (Signed)
LCSW Group Therapy Note No social work group was held today due to newly separated halls necessitating a higher number of groups, a significant number of expected and unexpected discharges, a number of necessary Suicide Prevention Education calls to family members, and a high number of admissions that required initial psychosocial assessments.  The following was provided to each patient on 300 and 500 halls in lieu of in-person group:  Healthy vs. Unhealthy Coping Skills and Supports   Unhealthy Qualities                                             Healthy Qualities Works (at first) Works   Stops working or starts hurting Continues working  Fast Usually takes time to develop  Easy Often difficult to learn  Usually a habit Usually unknown, has to become a habit  Can do alone Often need to reach out for help   Leads to loss Leads to gain         My Unhealthy Coping Skills                                    My Healthy Coping Skills                       My Unhealthy Supports                                           My Healthy Supports                         Treyden Hakim Grossman-Orr, LCSW 07/11/2022  2:41 PM     

## 2022-07-11 NOTE — Progress Notes (Signed)
Pt is A&OX4, calm, flat, denies suicidal ideations, denies homicidal ideations, denies auditory hallucinations and denies visual hallucinations. Pt verbally agrees to approach staff if these become apparent and before harming self or others. Pt denies experiencing nightmares. Mood and affect are congruent. Pt appetite is ok. No complaints of anxiety, distress, pain and/or discomfort at this time. [name] memory appears to be grossly intact, and Pt hasn't displayed any injurious behaviors. Pt is medication compliant. There's no evidence of suicidal intent. Psychomotor activity was WNL. No s/s of Parkinson, Dystonia, Akathisia and/or Tardive Dyskinesia noted.   

## 2022-07-12 DIAGNOSIS — F251 Schizoaffective disorder, depressive type: Secondary | ICD-10-CM | POA: Diagnosis not present
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
## 2022-07-12 NOTE — Group Note (Signed)
LCSW Group Therapy Note  07/12/2022      Type of Therapy and Topic:  Group Therapy: Gratitude   Description:   Group was not held due to acuity on the unit and the overall inability of patients to program in a productive manner.  A handout was given to each patient, with the following information:   Gratitude  "Acknowledging the good that you already have in your life is the foundation for all abundance." - Eckhart Tolle  " 'Enough' is a feast." - Buddhist Proverb  "Gratitude sweetens even the smallest moments."  "It is not joy that makes us grateful; It is gratitude that makes us joyful." - David Steindl-Rast    Put at least one response under each category of something for which you are grateful:  People:  Experiences:  Things:  Places:  Skills:  Other:  Add more responses as you get ideas from other people.   Therapeutic Modalities:   Activity  Faelynn Wynder J Grossman-Orr, LCSW .  

## 2022-07-12 NOTE — Progress Notes (Signed)
Pt states she slept "Good" last night. Pt denies SI/HI/AVH. Pt is soft spoken and pleasant on the unit. Pt states she is possibly going to a shelter or group home upon discharge. Pt remains safe.

## 2022-07-12 NOTE — B[facility] Group Notes (Signed)
Adult Psychoeducational Group Note Date:  07/12/2022 Time:  0900-1045 Group Topic/Focus: PROGRESSIVE RELAXATION. A group where deep breathing is taught and tensing and relaxation muscle groups is used. Imagery is used as well.  Pts are asked to imagine 3 pillars that hold them up when they are not able to hold themselves up and to share that with the group.  Participation Level:  Active  Participation Quality:  Appropriate  Affect:  Appropriate  Cognitive:  Oriented  Insight: Improving  Engagement in Group:  Engaged  Modes of Intervention:  Activity, Discussion, Education, and Support  Additional Comments:  Energy is at a 4/10. Quiet. Responds to question. Shows some insight.   Dione Housekeeper

## 2022-07-12 NOTE — B[facility] Group Notes (Signed)
PsychoEducational Group Note. Patients were given interactivity with mental health comparison to a car maintenance.  Pts were given a toy car and asked Patients were asked to explore what helps a car run, using fuel to help car run, as compared to how we need sleep, appropriate supports, healthy food and coping skills to support mental health maintenance. Pts were asked to identify one healthy coping mechanism that helps support their mental health. Pt attended and was appropriate. 

## 2022-07-13 DIAGNOSIS — F251 Schizoaffective disorder, depressive type: Secondary | ICD-10-CM | POA: Diagnosis not present

## 2022-07-13 MED ORDER — DOCUSATE SODIUM 100 MG PO CAPS
100.0000 mg | ORAL_CAPSULE | Freq: Two times a day (BID) | ORAL | Status: DC
  Administered 2022-07-13 – 2022-09-10 (×118): 100 mg via ORAL
  Filled 2022-07-13 (×131): qty 1

## 2022-07-13 NOTE — Progress Notes (Signed)
Adult Psychoeducational Group Note  Date:  07/13/2022 Time:  9:29 PM  Group Topic/Focus:  Wrap-Up Group:   The focus of this group is to help patients review their daily goal of treatment and discuss progress on daily workbooks.  Participation Level:  Minimal  Participation Quality:  Appropriate  Affect:  Appropriate  Cognitive:  Appropriate  Insight: Appropriate  Engagement in Group:  Limited  Modes of Intervention:  Discussion  Additional Comments:  Pt stated her goal for today was to focus on her treatment plan. Pt stated she accomplished her goal today. Pt stated she talked with her doctor and with her social worker about her care today. Pt rated her overall day a 4 out of 10. Pt stated she made no calls today. Pt stated she felt better about herself tonight. Pt stated she was able to attend all meals today. Pt stated she took all medications provided today. Pt stated her appetite was fair today. Pt rated her sleep last night was fair. Pt stated the goal tonight was to get some rest. Pt stated she had no physical pain tonight. Pt deny visual hallucinations and auditory issues tonight. Pt denies thoughts of harming herself or others. Pt stated she would alert staff if anything changed.  Felipa Furnace 07/13/2022, 9:29 PM

## 2022-07-13 NOTE — BHH Counselor (Signed)
CSW spoke with patient mother, Cheryl Adams, to request to go to the court and request emergency guardianship and for the guardianship to be pushed up.  Cheryl Adams agreed to go to the courthouse in the morning and request for court date to be pushed up.  Agreed that we would follow up tomorrow to see if it was granted.    [name], LCSW, LCAS Clincal Social Worker  [facility]

## 2022-07-13 NOTE — Plan of Care (Signed)
Upon chart review it was noted patient was inadvertently not seen by provider on 07/11/2022.  Please note this patient was discussed in morning treatment team on the date, all needs were addressed with the staff on that date but patient was inadvertently not seen by a provider and no progress note was entered.

## 2022-07-13 NOTE — Group Note (Signed)
07/13/2022 1pm   Type of Therapy and Topic:  Group Therapy:  Who Am I?  Self Esteem, Self-Actualization and Understanding Self.    Participation Level:  Active  Description of Group:   In this group patients will be asked to explore values, beliefs, truths, and morals as they relate to personal self.  Patients will be guided to discuss their thoughts, feelings, and behaviors related to what they identify as important to their true self. Patients will process together how values, beliefs and truths are connected to specific choices patients make every day. Each patient will be challenged to identify changes that they are motivated to make in order to improve self-esteem and self-actualization. This group will be process-oriented, with patients participating in exploration of their own experiences, giving and receiving support, and processing challenge from other group members.   Therapeutic Goals: Patient will identify false beliefs that currently interfere with their self-esteem.  Patient will identify feelings, thought process, and behaviors related to self and will become aware of the uniqueness of themselves and of others.  Patient will be able to identify and verbalize values, morals, and beliefs as they relate to self. Patient will begin to learn how to build self-esteem/self-awareness by expressing what is important and unique to them personally.   Summary of Patient Progress Patient participated appropriately in group and shared that she receives compliments about her shirt from the staff at [facility]. Patient states that she would like to be more confident with decision making and that she would continues to practice to improve self esteem.  She also reports that she hopes to be able to define more what her values are to improve self esteem.      Therapeutic Modalities:   Cognitive Behavioral Therapy Solution Focused Therapy Motivational Interviewing Brief Therapy   Beatris Si,  LCSW 07/13/2022 1:53 PM

## 2022-07-13 NOTE — Progress Notes (Signed)
Adult Psychoeducational Group Note  Date:  07/13/2022 Time:  9:46 AM  Group Topic/Focus:  Goals Group:   The focus of this group is to help patients establish daily goals to achieve during treatment and discuss how the patient can incorporate goal setting into their daily lives to aide in recovery.  Participation Level:  Active  Participation Quality:  Appropriate  Affect:  Appropriate  Cognitive:  Appropriate  Insight: Appropriate  Engagement in Group:  Engaged  Modes of Intervention:  Discussion  Additional Comments:  Patient attended morning orientation group and participated.   Claus Silvestro W Pammy Vesey 07/13/2022, 9:46 AM

## 2022-07-13 NOTE — Group Note (Signed)
Recreation Therapy Group Note   Group Topic:Coping Skills  Group Date: 07/13/2022 Start Time: 1000 End Time: 1030 Facilitators: Caroll Rancher, LRT,CTRS Location: [address] Dayroom   Goal Area(s) Addresses: Patient will define what a coping skill is. Patient will work with peer to create a list of healthy coping skills beginning with each letter of the alphabet. Patient will successfully identify positive coping skills they can use post d/c.  Patient will acknowledge benefit(s) of using learned coping skills post d/c.  Group Description: Coping A to Z. Patient asked to identify what a coping skill is and when they use them. Patients with Clinical research associate discussed healthy versus unhealthy coping skills. Next patients were given a blank worksheet titled "Coping Skills A-Z".  Patients were instructed to come up with at least one positive coping skill per letter of the alphabet.  Patients were given 15 minutes to brainstorm, before ideas were presented to the large group. Patients and LRT debriefed on the importance of coping skill selection based on situation and back-up plans when a skill tried is not effective. At the end of group, patients were given an handout of alphabetized strategies to keep for future reference.   Affect/Mood: Appropriate   Participation Level: Engaged   Participation Quality: Independent   Behavior: Appropriate   Speech/Thought Process: Focused   Insight: Good   Judgement: Good   Modes of Intervention: Worksheet   Patient Response to Interventions:  Engaged   Education Outcome:  Acknowledges education and In group clarification offered    Clinical Observations/Individualized Feedback: Pt arrived late to group but joined right in with the activity.  Pt was quiet and focused on gathering her answers.  Pt contributed coping skills such as ATVs, bowling, gardening, hiking, yoga, walking, tennis, swimming, panting and rock climbing.  Pt was bright and engaging during  group session.    Plan: Continue to engage patient in RT group sessions 2-3x/week.   Caroll Rancher, LRT,CTRS 07/13/2022 12:01 PM
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
## 2022-07-13 NOTE — Progress Notes (Signed)
   07/13/22 2115  Psych Admission Type (Psych Patients Only)  Admission Status Voluntary  Psychosocial Assessment  Patient Complaints None  Eye Contact Fair  Facial Expression Flat  Affect Appropriate to circumstance  Speech Soft  Interaction Cautious;Guarded;Childlike  Motor Activity Slow  Appearance/Hygiene Unremarkable  Behavior Characteristics Cooperative  Mood Preoccupied  Aggressive Behavior  Effect No apparent injury  Thought Process  Coherency WDL  Content Blaming others  Delusions Paranoid  Perception WDL  Hallucination None reported or observed  Judgment Limited  Confusion None  Danger to Self  Current suicidal ideation? Denies  Danger to Others  Danger to Others None reported or observed

## 2022-07-13 NOTE — Progress Notes (Signed)
   07/13/22 1200  Psych Admission Type (Psych Patients Only)  Admission Status Voluntary  Psychosocial Assessment  Patient Complaints None  Eye Contact Brief  Facial Expression Flat  Affect Appropriate to circumstance  Speech Soft  Interaction Childlike  Motor Activity Slow  Appearance/Hygiene Unremarkable  Behavior Characteristics Cooperative  Mood Preoccupied  Thought Process  Coherency WDL  Content Blaming others  Delusions Paranoid  Perception WDL  Hallucination None reported or observed  Judgment Limited  Confusion None  Danger to Self  Current suicidal ideation? Denies  Danger to Others  Danger to Others None reported or observed

## 2022-07-14 DIAGNOSIS — F251 Schizoaffective disorder, depressive type: Secondary | ICD-10-CM | POA: Diagnosis not present

## 2022-07-14 LAB — CBC WITH DIFFERENTIAL/PLATELET
Abs Immature Granulocytes: 0.03 10*3/uL (ref 0.00–0.07)
Basophils Absolute: 0 10*3/uL (ref 0.0–0.1)
Basophils Relative: 0 %
Eosinophils Absolute: 0.2 10*3/uL (ref 0.0–0.5)
Eosinophils Relative: 3 %
HCT: 40 % (ref 36.0–46.0)
Hemoglobin: 12.4 g/dL (ref 12.0–15.0)
Immature Granulocytes: 1 %
Lymphocytes Relative: 29 %
Lymphs Abs: 1.7 10*3/uL (ref 0.7–4.0)
MCH: 26.1 pg (ref 26.0–34.0)
MCHC: 31 g/dL (ref 30.0–36.0)
MCV: 84 fL (ref 80.0–100.0)
Monocytes Absolute: 0.3 10*3/uL (ref 0.1–1.0)
Monocytes Relative: 6 %
Neutro Abs: 3.5 10*3/uL (ref 1.7–7.7)
Neutrophils Relative %: 61 %
Platelets: 254 10*3/uL (ref 150–400)
RBC: 4.76 MIL/uL (ref 3.87–5.11)
RDW: 13.6 % (ref 11.5–15.5)
WBC: 5.7 10*3/uL (ref 4.0–10.5)
nRBC: 0 % (ref 0.0–0.2)

## 2022-07-14 MED ORDER — CLONAZEPAM 0.125 MG PO TBDP
0.2500 mg | ORAL_TABLET | Freq: Every day | ORAL | Status: DC
  Administered 2022-07-14 – 2022-07-15 (×2): 0.25 mg via ORAL
  Filled 2022-07-14 (×2): qty 2

## 2022-07-14 MED ORDER — POLYETHYLENE GLYCOL 3350 17 G PO PACK
17.0000 g | PACK | Freq: Two times a day (BID) | ORAL | Status: DC
  Administered 2022-07-14 – 2022-07-15 (×2): 17 g via ORAL
  Filled 2022-07-14 (×6): qty 1

## 2022-07-14 NOTE — Progress Notes (Signed)
   07/14/22 2015  Psych Admission Type (Psych Patients Only)  Admission Status Voluntary  Psychosocial Assessment  Patient Complaints None  Eye Contact Fair  Facial Expression Flat  Affect Appropriate to circumstance  Speech Soft  Interaction Cautious;Guarded;Childlike  Motor Activity Slow  Appearance/Hygiene Unremarkable  Behavior Characteristics Cooperative  Mood Suspicious;Preoccupied  Aggressive Behavior  Effect No apparent injury  Thought Process  Coherency WDL  Content Blaming others  Delusions Paranoid  Perception WDL  Hallucination None reported or observed  Judgment Limited  Confusion None  Danger to Self  Current suicidal ideation? Denies  Danger to Others  Danger to Others None reported or observed

## 2022-07-14 NOTE — Progress Notes (Signed)
Pt on unit today. Pt showers with encouragement from staff. During medication administration this morning, pt noted to have spit medications into cup and attempted to walk away from nurse. Several prompts needed as pt continued to attempt to spit medications back into cup. Nurse educated pt about medications and importance of adherence to regimen in regards to relapse of symptoms. Pt verbalized understanding however bx continued, Pt did eventually accept medications. Pt endorses anxiety and depression today, denies SI/HI/self harm thoughts. Pt denies a/v hallucinations however endorses paranoia. Q 15 minute checks ongoing for safety, will monitor.
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
## 2022-07-14 NOTE — B[facility] Counselor (Addendum)
CSW spoke with Mrs. Linda Grimmer [phone] (Mother) who states that she applied for emergent guardianship this morning (07/14/2022) and her court date has been moved up to 08/04/2022 at 9:00am.  Mrs. Breeding states that she is speaking with a lawyer as well and that the lawyer will be reaching out to [facility] to obtain psychiatric records for the Pt.     CSW spoke with Mr. Gari Crown (Guardian Ad Litem, GAL) [phone], who states that he will be faxing over the signed paperwork showing that he has been assigned to the case.  He states that he will also be contacting medical records to obtain copies of the [name] psychiatric evaluations.  He states that he will be coming to visit with the Pt next week and will contact the CSW to schedule this meeting.

## 2022-07-14 NOTE — Progress Notes (Signed)
Adult Psychoeducational Group Note  Date:  07/14/2022 Time:  10:35 PM  Group Topic/Focus:  Wrap-Up Group:   The focus of this group is to help patients review their daily goal of treatment and discuss progress on daily workbooks.  Participation Level:  Minimal  Participation Quality:  Appropriate  Affect:  Appropriate  Cognitive:  Disorganized and Confused  Insight: Lacking and Limited  Engagement in Group:  Limited  Modes of Intervention:  Discussion  Additional Comments:   Pt stated her goal for today was to focus on her treatment plan. Pt stated she accomplished her goal today. Pt stated she talked with her doctor and with her social worker about her care today. Pt rated her overall day a 4 out of 10. Pt stated she made no calls today. Pt stated she felt better about herself tonight. Pt stated she was able to attend all meals today. Pt stated she took all medications provided today. Pt stated her appetite was fair today. Pt rated her sleep last night was fair. Pt stated the goal tonight was to get some rest. Pt stated she had no physical pain tonight. Pt deny visual hallucinations and auditory issues tonight. Pt denies thoughts of harming herself or others. Pt stated she would alert staff if anything changed.  Felipa Furnace 07/14/2022, 10:35 PM

## 2022-07-15 ENCOUNTER — Encounter (HOSPITAL_COMMUNITY): Payer: Self-pay

## 2022-07-15 DIAGNOSIS — F251 Schizoaffective disorder, depressive type: Secondary | ICD-10-CM | POA: Diagnosis not present

## 2022-07-15 MED ORDER — POLYETHYLENE GLYCOL 3350 17 G PO PACK
17.0000 g | PACK | Freq: Every day | ORAL | Status: DC
  Administered 2022-07-16 – 2022-09-10 (×57): 17 g via ORAL
  Filled 2022-07-15 (×61): qty 1

## 2022-07-15 MED ORDER — CLONAZEPAM 0.25 MG PO TBDP
0.5000 mg | ORAL_TABLET | Freq: Every day | ORAL | Status: DC
  Administered 2022-07-16 – 2022-08-06 (×22): 0.5 mg via ORAL
  Filled 2022-07-15 (×22): qty 2

## 2022-07-15 MED ORDER — CLOZAPINE 25 MG PO TABS
275.0000 mg | ORAL_TABLET | Freq: Every day | ORAL | Status: DC
  Administered 2022-07-15 – 2022-07-16 (×2): 275 mg via ORAL
  Filled 2022-07-15 (×4): qty 3
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
## 2022-07-15 NOTE — Progress Notes (Signed)
   07/15/22 1300  Psych Admission Type (Psych Patients Only)  Admission Status Voluntary  Psychosocial Assessment  Patient Complaints None  Eye Contact Fair  Facial Expression Flat  Affect Appropriate to circumstance  Speech Soft  Interaction Cautious;Childlike;Guarded  Motor Activity Slow  Appearance/Hygiene Unremarkable  Behavior Characteristics Cooperative  Mood Suspicious;Preoccupied  Aggressive Behavior  Effect No apparent injury  Thought Process  Coherency WDL  Content Paranoia  Delusions Paranoid  Perception WDL  Hallucination None reported or observed  Judgment Limited  Confusion None  Danger to Self  Current suicidal ideation? Denies  Agreement Not to Harm Self Yes  Description of Agreement Verbal  Danger to Others  Danger to Others None reported or observed

## 2022-07-15 NOTE — Progress Notes (Signed)
   07/15/22 2100  Psych Admission Type (Psych Patients Only)  Admission Status Voluntary  Psychosocial Assessment  Patient Complaints None  Eye Contact Fair  Facial Expression Flat  Affect Appropriate to circumstance  Speech Soft  Interaction Cautious;Guarded;Childlike  Motor Activity Slow  Appearance/Hygiene Unremarkable  Behavior Characteristics Cooperative  Mood Suspicious;Preoccupied  Aggressive Behavior  Effect No apparent injury  Thought Process  Coherency WDL  Content Blaming others  Delusions Paranoid  Perception WDL  Hallucination None reported or observed  Judgment Limited  Confusion None  Danger to Self  Current suicidal ideation? Denies  Danger to Others  Danger to Others None reported or observed

## 2022-07-15 NOTE — BH IP Treatment Plan (Signed)
Interdisciplinary Treatment and Diagnostic Plan Update  07/15/2022 Time of Session: 10:30am  Cheryl Adams MRN: [id number]  Principal Diagnosis: Schizoaffective disorder, depressive type [facility])  Secondary Diagnoses: Principal Problem:   Schizoaffective disorder, depressive type (HCC) Active Problems:   Insomnia   GAD (generalized anxiety disorder)   Vitamin D deficiency   Current Medications:  Current Facility-Administered Medications  Medication Dose Route Frequency Provider Last Rate Last Admin   acetaminophen (TYLENOL) tablet 500 mg  500 mg Oral Q6H PRN Massengill, Harrold Donath, MD       amLODipine (NORVASC) tablet 10 mg  10 mg Oral Daily Massengill, Harrold Donath, MD   10 mg at 07/15/22 0813   antiseptic oral rinse (BIOTENE) solution 15 mL  15 mL Mouth Rinse 5 X Daily PRN Comer Locket, MD       atenolol (TENORMIN) tablet 25 mg  25 mg Oral Daily Massengill, Harrold Donath, MD   25 mg at 07/15/22 0813   clonazepam (KLONOPIN) disintegrating tablet 0.25 mg  0.25 mg Oral Daily Massengill, Nathan, MD   0.25 mg at 07/15/22 8250   cloZAPine (CLOZARIL) tablet 250 mg  250 mg Oral QHS Hill, Shelbie Hutching, MD   250 mg at 07/14/22 2032   docusate sodium (COLACE) capsule 100 mg  100 mg Oral BID Massengill, Harrold Donath, MD   100 mg at 07/15/22 0813   ferrous sulfate tablet 325 mg  325 mg Oral Daily Princess Bruins, DO   325 mg at 07/15/22 0813   hydrOXYzine (ATARAX) tablet 25 mg  25 mg Oral TID PRN Phineas Inches, MD   25 mg at 06/17/22 2027   OLANZapine zydis (ZYPREXA) disintegrating tablet 5 mg  5 mg Oral Q8H PRN Massengill, Harrold Donath, MD   5 mg at 05/24/22 2154   And   LORazepam (ATIVAN) tablet 1 mg  1 mg Oral Q8H PRN Massengill, Harrold Donath, MD   1 mg at 07/03/22 2150   And   ziprasidone (GEODON) injection 20 mg  20 mg Intramuscular Q8H PRN Massengill, Harrold Donath, MD       metFORMIN (GLUCOPHAGE-XR) 24 hr tablet 500 mg  500 mg Oral Q breakfast Massengill, Nathan, MD   500 mg at 07/15/22 0813   methylphenidate (RITALIN)  tablet 10 mg  10 mg Oral Daily Hill, Shelbie Hutching, MD   10 mg at 07/15/22 0813   polyethylene glycol (MIRALAX / GLYCOLAX) packet 17 g  17 g Oral BID Phineas Inches, MD   17 g at 07/15/22 0814   sertraline (ZOLOFT) tablet 100 mg  100 mg Oral Daily Massengill, Harrold Donath, MD   100 mg at 07/15/22 0813   Vitamin D (Ergocalciferol) (DRISDOL) capsule 50,000 Units  50,000 Units Oral Q7 days Starleen Blue, NP   50,000 Units at 07/13/22 5397   PTA Medications: Medications Prior to Admission  Medication Sig Dispense Refill Last Dose   ferrous sulfate 325 (65 FE) MG tablet Take 325 mg by mouth daily.      risperiDONE (RISPERDAL) 1 MG tablet Take 1 tablet (1 mg total) by mouth daily.      risperiDONE (RISPERDAL) 2 MG tablet Take 1 tablet (2 mg total) by mouth at bedtime.       Patient Stressors: Health problems   Marital or family conflict   Medication change or noncompliance    Patient Strengths: Average or above average intelligence  Supportive family/friends   Treatment Modalities: Medication Management, Group therapy, Case management,  1 to 1 session with clinician, Psychoeducation, Recreational therapy.   Physician Treatment Plan  for Primary Diagnosis: Schizoaffective disorder, depressive type (HCC) Long Term Goal(s): Improvement in symptoms so as ready for discharge   Short Term Goals: Ability to verbalize feelings will improve Ability to disclose and discuss suicidal ideas Ability to identify and develop effective coping behaviors will improve Compliance with prescribed medications will improve  Medication Management: Evaluate patient's response, side effects, and tolerance of medication regimen.  Therapeutic Interventions: 1 to 1 sessions, Unit Group sessions and Medication administration.  Evaluation of Outcomes: Progressing  Physician Treatment Plan for Secondary Diagnosis: Principal Problem:   Schizoaffective disorder, depressive type (HCC) Active Problems:   Insomnia    GAD (generalized anxiety disorder)   Vitamin D deficiency  Long Term Goal(s): Improvement in symptoms so as ready for discharge   Short Term Goals: Ability to verbalize feelings will improve Ability to disclose and discuss suicidal ideas Ability to identify and develop effective coping behaviors will improve Compliance with prescribed medications will improve     Medication Management: Evaluate patient's response, side effects, and tolerance of medication regimen.  Therapeutic Interventions: 1 to 1 sessions, Unit Group sessions and Medication administration.  Evaluation of Outcomes: Progressing   RN Treatment Plan for Primary Diagnosis: Schizoaffective disorder, depressive type (HCC) Long Term Goal(s): Knowledge of disease and therapeutic regimen to maintain health will improve  Short Term Goals: Ability to remain free from injury will improve, Ability to participate in decision making will improve, Ability to verbalize feelings will improve, Ability to disclose and discuss suicidal ideas, and Ability to identify and develop effective coping behaviors will improve  Medication Management: RN will administer medications as ordered by provider, will assess and evaluate patient's response and provide education to patient for prescribed medication. RN will report any adverse and/or side effects to prescribing provider.  Therapeutic Interventions: 1 on 1 counseling sessions, Psychoeducation, Medication administration, Evaluate responses to treatment, Monitor vital signs and CBGs as ordered, Perform/monitor CIWA, COWS, AIMS and Fall Risk screenings as ordered, Perform wound care treatments as ordered.  Evaluation of Outcomes: Progressing   LCSW Treatment Plan for Primary Diagnosis: Schizoaffective disorder, depressive type (HCC) Long Term Goal(s): Safe transition to appropriate next level of care at discharge, Engage patient in therapeutic group addressing interpersonal concerns.  Short Term  Goals: Engage patient in aftercare planning with referrals and resources, Increase social support, Increase emotional regulation, Facilitate acceptance of mental health diagnosis and concerns, Identify triggers associated with mental health/substance abuse issues, and Increase skills for wellness and recovery  Therapeutic Interventions: Assess for all discharge needs, 1 to 1 time with Social worker, Explore available resources and support systems, Assess for adequacy in community support network, Educate family and significant other(s) on suicide prevention, Complete Psychosocial Assessment, Interpersonal group therapy.  Evaluation of Outcomes: Progressing  Progress in Treatment: Attending groups: Yes. Participating in groups: Yes. Taking medication as prescribed: Yes. Toleration medication: Yes. Family/Significant other contact made: Yes, individual(s) contacted:  Sherlean Foot, mother Patient understands diagnosis: Yes. Discussing patient identified problems/goals with staff: Yes. Medical problems stabilized or resolved: Yes. Denies suicidal/homicidal ideation: No. Issues/concerns per patient self-inventory: Yes. Other: none   New problem(s) identified: No, Describe:  none   New Short Term/Long Term Goal(s):Patient to work towards elimination of symptoms of psychosis, medication management for mood stabilization; development of comprehensive mental wellness plan.   Patient Goals:  No additional goals identified at this time. Patient to continue to work towards original goals identified in initial treatment team meeting. CSW will remain available to patient should they voice additional  treatment goals.    Discharge Plan or Barriers: Patient continues to be paranoid of others in her home prior to admission. Patient may discharge to shelter resources.    Reason for Continuation of Hospitalization: Other; describe psychosis    Estimated Length of Stay: 1-7 days    Last 3 [place]  Suicide Severity Risk Score: Flowsheet Row [facility] (Current[facility] from 05/18/2022 in [facility] Office Visit from 04/14/2022 in [facility]  C-SSRS RISK CATEGORY No Risk High Risk No Risk       Last PHQ 2/9 Scores:    04/14/2022   10:46 AM 02/24/2022   11:12 AM 01/13/2022    9:14 AM  Depression screen PHQ 2/9  Decreased Interest 0 0 0  Down, Depressed, Hopeless 0 0 0  PHQ - 2 Score 0 0 0    Scribe for Treatment Team: Aram Beecham, Theresia Majors 07/15/2022 11:11 AM

## 2022-07-15 NOTE — Progress Notes (Signed)
[facility] Group Notes:  (Nursing/MHT/Case Management/Adjunct)  Date:  07/15/2022  Time:  2000  Type of Therapy:   wrap up group  Participation Level:  Active  Participation Quality:  Appropriate, Attentive, and Sharing  Affect:  Flat  Cognitive:  Alert  Insight:  Improving  Engagement in Group:  Engaged  Modes of Intervention:  Clarification, Education, and Support  Summary of Progress/Problems: Positive thinking and positive change were discussed.   Marcille Buffy 07/15/2022, 8:39 PM

## 2022-07-16 DIAGNOSIS — Z6839 Body mass index (BMI) 39.0-39.9, adult: Secondary | ICD-10-CM | POA: Diagnosis not present

## 2022-07-16 DIAGNOSIS — Z20822 Contact with and (suspected) exposure to covid-19: Secondary | ICD-10-CM | POA: Diagnosis present

## 2022-07-16 DIAGNOSIS — F251 Schizoaffective disorder, depressive type: Secondary | ICD-10-CM | POA: Diagnosis present

## 2022-07-16 DIAGNOSIS — F411 Generalized anxiety disorder: Secondary | ICD-10-CM | POA: Diagnosis present

## 2022-07-16 DIAGNOSIS — G4700 Insomnia, unspecified: Secondary | ICD-10-CM | POA: Diagnosis present

## 2022-07-16 DIAGNOSIS — F064 Anxiety disorder due to known physiological condition: Secondary | ICD-10-CM | POA: Diagnosis present

## 2022-07-16 DIAGNOSIS — I10 Essential (primary) hypertension: Secondary | ICD-10-CM | POA: Diagnosis present

## 2022-07-16 DIAGNOSIS — F79 Unspecified intellectual disabilities: Secondary | ICD-10-CM | POA: Diagnosis present

## 2022-07-16 DIAGNOSIS — I951 Orthostatic hypotension: Secondary | ICD-10-CM | POA: Diagnosis not present

## 2022-07-16 DIAGNOSIS — Z818 Family history of other mental and behavioral disorders: Secondary | ICD-10-CM | POA: Diagnosis not present

## 2022-07-16 DIAGNOSIS — F333 Major depressive disorder, recurrent, severe with psychotic symptoms: Secondary | ICD-10-CM | POA: Diagnosis not present

## 2022-07-16 DIAGNOSIS — F061 Catatonic disorder due to known physiological condition: Secondary | ICD-10-CM | POA: Diagnosis present

## 2022-07-16 DIAGNOSIS — M436 Torticollis: Secondary | ICD-10-CM | POA: Diagnosis not present

## 2022-07-16 DIAGNOSIS — K117 Disturbances of salivary secretion: Secondary | ICD-10-CM | POA: Diagnosis present

## 2022-07-16 DIAGNOSIS — F259 Schizoaffective disorder, unspecified: Secondary | ICD-10-CM | POA: Diagnosis present

## 2022-07-16 DIAGNOSIS — E559 Vitamin D deficiency, unspecified: Secondary | ICD-10-CM | POA: Diagnosis present

## 2022-07-16 DIAGNOSIS — Z79899 Other long term (current) drug therapy: Secondary | ICD-10-CM | POA: Diagnosis not present

## 2022-07-16 DIAGNOSIS — Z7984 Long term (current) use of oral hypoglycemic drugs: Secondary | ICD-10-CM | POA: Diagnosis not present

## 2022-07-16 DIAGNOSIS — F819 Developmental disorder of scholastic skills, unspecified: Secondary | ICD-10-CM | POA: Diagnosis present

## 2022-07-16 DIAGNOSIS — R630 Anorexia: Secondary | ICD-10-CM | POA: Diagnosis present

## 2022-07-16 DIAGNOSIS — F600 Paranoid personality disorder: Secondary | ICD-10-CM | POA: Diagnosis present

## 2022-07-16 LAB — CLOZAPINE (CLOZARIL)
Clozapine Lvl: 201 ng/mL — ABNORMAL LOW (ref 350–600)
NorClozapine: 90 ng/mL
Total(Cloz+Norcloz): 291 ng/mL

## 2022-07-16 NOTE — Progress Notes (Signed)
   07/16/22 2115  Psych Admission Type (Psych Patients Only)  Admission Status Voluntary  Psychosocial Assessment  Patient Complaints None  Eye Contact Fair  Facial Expression Flat  Affect Appropriate to circumstance  Speech Soft  Interaction Cautious;Guarded;Childlike  Motor Activity Slow  Appearance/Hygiene Unremarkable  Behavior Characteristics Cooperative  Mood Preoccupied;Suspicious  Aggressive Behavior  Effect No apparent injury  Thought Process  Coherency WDL  Content Blaming others  Delusions Paranoid  Perception WDL  Hallucination None reported or observed  Judgment Limited  Confusion None  Danger to Self  Current suicidal ideation? Denies  Danger to Others  Danger to Others None reported or observed

## 2022-07-16 NOTE — Group Note (Signed)
Occupational Therapy Group Note   Group Topic:Goal Setting  Group Date: 07/16/2022 Start Time: 1400 End Time: 1455 Facilitators: Ted Mcalpine, OT   Group Description: Group encouraged engagement and participation through discussion focused on goal setting. Group members were introduced to goal-setting using the SMART Goal framework, identifying goals as Specific, Measureable, Acheivable, Relevant, and Time-Bound. Group members took time from group to create their own personal goal reflecting the SMART goal template and shared for review by peers and OT.    Therapeutic Goal(s):  Identify at least one goal that fits the SMART framework    Participation Level: Minimal   Participation Quality: Independent   Behavior: Appropriate   Speech/Thought Process: Focused   Affect/Mood: Appropriate   Insight: Fair   Judgement: Fair   Individualization: pt was passively engaged in their participation of group discussion/activity. New skills were identified  Modes of Intervention: Discussion and Education  Patient Response to Interventions:  Attentive and Receptive   Plan: Continue to engage patient in OT groups 2 - 3x/week.  07/16/2022  Ted Mcalpine, OT Kerrin Champagne, OT

## 2022-07-16 NOTE — Progress Notes (Signed)
   07/16/22 0515  Sleep  Number of Hours 8
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
## 2022-07-16 NOTE — Progress Notes (Signed)
[facility] Group Notes:  (Nursing/MHT/Case Management/Adjunct)  Date:  07/16/2022  Time:  2000 Type of Therapy:   wrap up group  Participation Level:  Active  Participation Quality:  Appropriate, Attentive, and Sharing  Affect:  Appropriate  Cognitive:  Alert  Insight:  Limited  Engagement in Group:  Engaged  Modes of Intervention:  Clarification, Education, and Socialization  Summary of Progress/Problems: Positive thinking and self-care were discussed.   Marcille Buffy 07/16/2022, 8:36 PM

## 2022-07-17 DIAGNOSIS — F251 Schizoaffective disorder, depressive type: Secondary | ICD-10-CM | POA: Diagnosis not present

## 2022-07-17 MED ORDER — CLOZAPINE 100 MG PO TABS
300.0000 mg | ORAL_TABLET | Freq: Every day | ORAL | Status: DC
  Administered 2022-07-17 – 2022-07-20 (×4): 300 mg via ORAL
  Filled 2022-07-17 (×6): qty 3

## 2022-07-17 NOTE — Group Note (Signed)
LCSW Group Therapy Note   Group Date: 07/17/2022 Start Time: 1400 End Time: 1500   Type of Therapy and Topic:  Group Therapy: Wise Mind  Participation Level:  Active  Description of Group: Group members were presented with topic about wise mind, reasonable mind, and emotional mind.  Group members asked to identify situations were they used their wise mind, reasonable mind and emotional mind.  Members asked to identify how behaviors affected them after using parts of their mind and identified alternative behaviors they can use when they are in crisis.    Therapeutic Goals:  1. Patients will identify consequences of using emotional mind and rational mind.  2. Patients will engage in discussion on how they cope when they are in crisis 3.  Patients will discuss coping mechanisms to engage their wise mind     Summary of Patient Progress:    Patient participated appropriately in group.  Patient discussed feeling anxious around her medications and was able to participate in discussion around her emotional mind and rational mind.  Patient discussed importance of using wise mind and participated in discussion with others.   Therapeutic Modalities:  DBT Solution focused therapy    Gardiner Sleeper Cheryl Tatlock, LCSW 04/15/2022  1:41 PM

## 2022-07-17 NOTE — Progress Notes (Signed)
   07/17/22 0515  Sleep  Number of Hours 7.5

## 2022-07-17 NOTE — Progress Notes (Signed)
Adult Psychoeducational Group Note  Date:  07/17/2022 Time:  8:54 PM  Group Topic/Focus:  Wrap-Up Group:   The focus of this group is to help patients review their daily goal of treatment and discuss progress on daily workbooks.  Participation Level:  Active  Participation Quality:  Appropriate  Affect:  Appropriate  Cognitive:  Appropriate  Insight: Appropriate  Engagement in Group:  Engaged  Modes of Intervention:  Discussion  Additional Comments:   Pt actively participated in the Wrap Up group. Pt rated her day and mood 4/10. Pt explained that it was just a regular day. Pt did not set any goals today. Pt identified walking outside today as a positive coping skills. Pt shared that talking  with this MHT helped her feel "good and better about the day".  Cheryl Adams 07/17/2022, 8:54 PM
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
## 2022-07-17 NOTE — [facility] Group Notes (Signed)
Spirituality group facilitated by Kathleen Argue, B[facility].   Group Description: Group focused on topic of hope. Patients participated in facilitated discussion around topic, connecting with one another around experiences and definitions for hope. Group members engaged with visual explorer photos, reflecting on what hope looks like for them today. Group engaged in discussion around how their definitions of hope are present today in hospital.   Modalities: Psycho-social ed, Adlerian, Narrative, MI   Patient Progress: Cheryl Adams attended group and was able to participate when called on.  Her comments were on topic and appropriate.  [address] [facility] Pager, [phone]

## 2022-07-17 NOTE — [facility] Group Notes (Signed)
Adult Psychoeducational Group Note  Date:  07/17/2022 Time:  9:08 AM  Group Topic/Focus:  Goals Group:   The focus of this group is to help patients establish daily goals to achieve during treatment and discuss how the patient can incorporate goal setting into their daily lives to aide in recovery.  Participation Level:  Active  Participation Quality:  Attentive  Affect:  Appropriate  Cognitive:  Appropriate  Insight: Appropriate  Engagement in Group:  Engaged  Modes of Intervention:  Discussion  Additional Comments:  Pt has a goal today of working on healthy eating habits. Pt would also like to attend a group about coping with anxiety and adding some new skills.   Deforest Hoyles Tamela Elsayed 07/17/2022, 9:08 AM

## 2022-07-17 NOTE — Progress Notes (Signed)
   07/17/22 2015  Psych Admission Type (Psych Patients Only)  Admission Status Voluntary  Psychosocial Assessment  Patient Complaints None  Eye Contact Fair  Facial Expression Flat  Affect Appropriate to circumstance  Speech Soft  Interaction Cautious;Guarded;Childlike  Motor Activity Slow  Appearance/Hygiene Unremarkable  Behavior Characteristics Cooperative  Mood Preoccupied  Aggressive Behavior  Effect No apparent injury  Thought Process  Coherency WDL  Content Blaming others  Delusions Paranoid  Perception WDL  Hallucination None reported or observed  Judgment Limited  Confusion None  Danger to Self  Current suicidal ideation? Denies  Danger to Others  Danger to Others None reported or observed

## 2022-07-18 DIAGNOSIS — F251 Schizoaffective disorder, depressive type: Secondary | ICD-10-CM | POA: Diagnosis not present

## 2022-07-18 NOTE — Progress Notes (Signed)
   07/18/22 1100  Psych Admission Type (Psych Patients Only)  Admission Status Voluntary  Psychosocial Assessment  Patient Complaints None  Eye Contact Fair  Facial Expression Flat  Affect Appropriate to circumstance  Speech Soft  Interaction Cautious;Childlike;Guarded  Motor Activity Slow  Appearance/Hygiene Unremarkable  Behavior Characteristics Cooperative  Mood Preoccupied;Suspicious  Aggressive Behavior  Effect No apparent injury  Thought Process  Coherency WDL  Content Blaming others  Delusions Paranoid  Perception WDL  Hallucination None reported or observed  Judgment Limited  Confusion None  Danger to Self  Current suicidal ideation? Denies  Self-Injurious Behavior No self-injurious ideation or behavior indicators observed or expressed   Agreement Not to Harm Self Yes  Danger to Others  Danger to Others None reported or observed

## 2022-07-18 NOTE — Group Note (Signed)
  [facility] LCSW Group Therapy Note  Date/Time:  07/18/2022  Type of Therapy and Topic:  Group Therapy:  Feelings About Hospitalization  Participation Level:  Active   Description of Group This process group involved patients discussing their feelings related to being hospitalized, as well as the benefits they see to being in the hospital.  These feelings and benefits were itemized.  The group then brainstormed specific ways in which they could seek those same benefits when they discharge and return home.  Therapeutic Goals Patient will identify and describe positive and negative feelings related to hospitalization Patient will verbalize benefits of hospitalization to themselves personally Patients will brainstorm together ways they can obtain similar benefits in the outpatient setting, identify barriers to wellness and possible solutions  Summary of Patient Progress:  The patient expressed their primary feelings about being hospitalized are "I feel okay about it." Pt expressed benefits of being on a schedule while at the hospital.  Therapeutic Modalities Cognitive Behavioral Therapy Motivational Interviewing    [place], LCSWA 07/18/2022, 11:46 AM

## 2022-07-18 NOTE — Progress Notes (Signed)
   07/18/22 2115  Psych Admission Type (Psych Patients Only)  Admission Status Voluntary  Psychosocial Assessment  Patient Complaints None  Eye Contact Fair  Facial Expression Flat  Affect Appropriate to circumstance  Speech Soft  Interaction Cautious;Guarded;Childlike  Motor Activity Slow  Appearance/Hygiene Unremarkable  Behavior Characteristics Cooperative  Mood Suspicious;Preoccupied  Aggressive Behavior  Effect No apparent injury  Thought Process  Coherency WDL  Content Blaming others  Delusions Paranoid  Perception WDL  Hallucination None reported or observed  Judgment Limited  Confusion None  Danger to Self  Current suicidal ideation? Denies  Danger to Others  Danger to Others None reported or observed

## 2022-07-18 NOTE — Progress Notes (Signed)
   07/18/22 0515  Sleep  Number of Hours 6.75    
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
## 2022-07-18 NOTE — [facility] Group Notes (Signed)
Goals Group 07/18/2022   Group Focus: affirmation, clarity of thought, and goals/reality orientation Treatment Modality:  Psychoeducation Interventions utilized were assignment, group exercise, and support Purpose: To be able to understand and verbalize the reason for their admission to the hospital. To understand that the medication helps with their chemical imbalance but they also need to work on their choices in life. To be challenged to develop a list of 30 positives about themselves. Also introduce the concept that "feelings" are not reality.  Participation Level: Limited   Participation Quality:lacking Affect:  Appropriate  Cognitive:  imappropiate.  Insight:  lacking  Engagement in Group: not engaged  Additional Comments:  Rates her energy at a 4/10. Did not participated in the rest of the group.  Dione Housekeeper

## 2022-07-19 DIAGNOSIS — F333 Major depressive disorder, recurrent, severe with psychotic symptoms: Secondary | ICD-10-CM | POA: Diagnosis not present

## 2022-07-19 NOTE — Progress Notes (Signed)
Adult Psychoeducational Group Note  Date:  07/19/2022 Time:  9:43 PM  Group Topic/Focus:  Wrap-Up Group:   The focus of this group is to help patients review their daily goal of treatment and discuss progress on daily workbooks.  Participation Level:  Active  Participation Quality:  Appropriate  Affect:  Appropriate  Cognitive:  Appropriate  Insight: Appropriate  Engagement in Group:  Engaged  Modes of Intervention:  Discussion  Additional Comments:  Pt stated her day was okay.  Pt stated her goal for the day was to talk to doctor, social workers, and meditate. Pt goal was met.  Elise Benne 07/19/2022, 9:43 PM

## 2022-07-19 NOTE — [facility] Group Notes (Signed)
Adult Psychoeducational Group Note Date:  07/19/2022 Time:  [id number] Group Topic/Focus: PROGRESSIVE RELAXATION. A group where deep breathing is taught and tensing and relaxation muscle groups is used. Imagery is used as well.  Pts are asked to imagine 3 pillars that hold them up when they are not able to hold themselves up and to share that with the group.  Participation Level:  limited  Participation Quality:  Appropriate  Affect:  flat  Cognitive:  Oriented  Insight: unable to assess  Engagement in Group:  Engaged. Listens with her eyes closed  Modes of Intervention:  Activity, Discussion, Education, and Support  Additional Comments:  Pt rates her energy at a 4. States her supports are God, music and her journaling  Dione Housekeeper

## 2022-07-19 NOTE — Progress Notes (Signed)
   07/19/22 2000  Psych Admission Type (Psych Patients Only)  Admission Status Voluntary  Psychosocial Assessment  Patient Complaints None  Eye Contact Fair  Facial Expression Flat  Affect Appropriate to circumstance  Speech Soft  Interaction Cautious;Guarded;Childlike  Motor Activity Slow  Appearance/Hygiene Unremarkable  Behavior Characteristics Cooperative  Mood Suspicious;Preoccupied  Aggressive Behavior  Effect No apparent injury  Thought Process  Coherency WDL  Content Blaming others  Delusions Paranoid  Perception WDL  Hallucination None reported or observed  Judgment Limited  Confusion None  Danger to Self  Current suicidal ideation? Denies  Danger to Others  Danger to Others None reported or observed
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
## 2022-07-19 NOTE — Progress Notes (Signed)
   07/19/22 0515  Sleep  Number of Hours 7

## 2022-07-19 NOTE — Progress Notes (Signed)
Pt did attend group tonight.
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
## 2022-07-19 NOTE — Progress Notes (Signed)
   07/19/22 1300  Psych Admission Type (Psych Patients Only)  Admission Status Voluntary  Psychosocial Assessment  Patient Complaints None  Eye Contact Fair  Facial Expression Flat  Affect Appropriate to circumstance  Speech Soft  Interaction Cautious;Childlike;Guarded  Motor Activity Slow  Appearance/Hygiene Unremarkable  Behavior Characteristics Cooperative  Mood Preoccupied;Suspicious  Aggressive Behavior  Effect No apparent injury  Thought Process  Coherency WDL  Content Blaming others  Delusions Paranoid  Perception WDL  Hallucination None reported or observed  Judgment Limited  Confusion None  Danger to Self  Current suicidal ideation? Denies  Self-Injurious Behavior No self-injurious ideation or behavior indicators observed or expressed   Agreement Not to Harm Self Yes  Description of Agreement Verbal  Danger to Others  Danger to Others None reported or observed

## 2022-07-20 DIAGNOSIS — F251 Schizoaffective disorder, depressive type: Secondary | ICD-10-CM | POA: Diagnosis not present

## 2022-07-20 NOTE — Progress Notes (Signed)
   07/20/22 0800  Psych Admission Type (Psych Patients Only)  Admission Status Voluntary  Psychosocial Assessment  Patient Complaints None  Eye Contact Fair  Facial Expression Flat  Affect Appropriate to circumstance  Speech Soft  Interaction Cautious;Childlike;Guarded  Motor Activity Slow  Appearance/Hygiene Unremarkable  Behavior Characteristics Cooperative  Mood Preoccupied;Suspicious  Aggressive Behavior  Effect No apparent injury  Thought Process  Coherency WDL  Content Blaming others  Delusions Paranoid  Perception WDL  Hallucination None reported or observed  Judgment Limited  Confusion None  Danger to Self  Current suicidal ideation? Denies  Agreement Not to Harm Self Yes  Description of Agreement verbal  Danger to Others  Danger to Others None reported or observed

## 2022-07-20 NOTE — Group Note (Signed)
Recreation Therapy Group Note   Group Topic:Health and Wellness  Group Date: 07/20/2022 Start Time: 1005 End Time: 1035 Facilitators: Caroll Rancher, LRT,CTRS Location: [address] Dayroom   Goal Area(s) Addresses:  Patient will define components of whole wellness. Patient will verbalize benefit of whole wellness.  Group Description:  LRT and patients discussed the three elements (mental, physical and spiritual) of wellness and how they intersect.  Patients also discuss the consequences when one of the elements is not in tune with the others.  LRT and patients then went into a series of exercises.  LRT led group in a series of stretches before allowing each group member to pick an exercise of their choosing to lead the group in.  The exercises were to take a minimum of 30 minutes. Each patient took turns leading group with their exercise.  Patients were encouraged to take breaks and get water as needed.   Affect/Mood: Appropriate and Drowsy   Participation Level: Engaged   Participation Quality: Independent   Behavior: Appropriate and Guarded   Speech/Thought Process: Focused   Insight: Good   Judgement: Good   Modes of Intervention: Music   Patient Response to Interventions:  Engaged   Education Outcome:  Acknowledges education and In group clarification offered    Clinical Observations/Individualized Feedback: Pt stated off a bit drowsy but became more lively as group went on.  Pt led group in toe touches and a few stretches.  Pt expressed exercise helps to increase blood flow.  Pt was quiet but would engage when prompted.  Pt was also attentive through remainder of group.    Plan: Continue to engage patient in RT group sessions 2-3x/week.   Caroll Rancher, LRT,CTRS 07/20/2022 12:06 PM

## 2022-07-20 NOTE — Progress Notes (Signed)
Adult Psychoeducational Group Note  Date:  07/20/2022 Time:  8:50 PM  Group Topic/Focus:  Wrap-Up Group:   The focus of this group is to help patients review their daily goal of treatment and discuss progress on daily workbooks.  Participation Level:  Active  Participation Quality:  Appropriate  Affect:  Appropriate  Cognitive:  Appropriate  Insight: Appropriate  Engagement in Group:  Improving  Modes of Intervention:  Discussion  Additional Comments: Pt stated her goal for today was to focus on her treatment plan. Pt stated she accomplished her goal today. Pt stated she talked with her doctor and with her social worker about her care today. Pt rated her overall day a 4 out of 10. Pt stated she made no calls today. Pt stated she felt better about herself tonight. Pt stated she was able to attend all meals today. Pt stated she took all medications provided today. Pt stated her appetite was good today. Pt rated her sleep last night was pretty good. Pt stated the goal tonight was to get some rest. Pt stated she had no physical pain tonight. Pt deny visual hallucinations and auditory issues tonight. Pt denies thoughts of harming herself or others. Pt stated she would alert staff if anything changed.  Felipa Furnace 07/20/2022, 8:50 PM

## 2022-07-20 NOTE — Group Note (Signed)
[facility] LCSW Group Therapy   07/20/2022 1pm    Type of Therapy and Topic:  Group Therapy:  Strengths Exploration   Participation Level: Active  Description of Group: This group allows individuals to explore their strengths, learn to use strengths in new ways to improve well-being. Strengths-based interventions involve identifying strengths, understanding how they are used, and learning new ways to apply them. Individuals will identify their strengths, and then explore their roles in different areas of life (relationships, professional life, and personal fulfillment). Individuals will think about ways in which they currently use their strengths, along with new ways they could begin using them.    Therapeutic Goals Patient will verbalize two of their strengths Patient will identify how their strengths are currently used Patient will identify two new ways to apply their strengths  Patients will create a plan to apply their strengths in their daily lives     Summary of Patient Progress:  Pt participated appropriately in group.  Patient identified strengths about herself including resourcefulness. Patient discussed things that she is working toward including creating boundaries and taking care of herself.  Patient participated in discussion and group activities about group topic.        Therapeutic Modalities Cognitive Behavioral Therapy Motivational Interviewing   Kamarian Sahakian, LCSW, LCAS Clincal Social Worker  [facility]

## 2022-07-20 NOTE — Plan of Care (Signed)
  Problem: Coping: Goal: Ability to verbalize frustrations and anger appropriately will improve Outcome: Progressing Goal: Ability to demonstrate self-control will improve Outcome: Progressing   Problem: Safety: Goal: Periods of time without injury will increase Outcome: Progressing   Problem: Coping: Goal: Ability to identify and develop effective coping behavior will improve Outcome: Progressing

## 2022-07-20 NOTE — Progress Notes (Signed)
   07/20/22 2200  Psych Admission Type (Psych Patients Only)  Admission Status Voluntary  Psychosocial Assessment  Patient Complaints None  Eye Contact Fair  Facial Expression Flat  Affect Appropriate to circumstance  Speech Soft  Interaction Cautious;Guarded;Childlike  Motor Activity Slow  Appearance/Hygiene Unremarkable  Behavior Characteristics Cooperative  Mood Suspicious;Preoccupied  Aggressive Behavior  Effect No apparent injury  Thought Process  Coherency WDL  Content Blaming others  Delusions Paranoid  Perception WDL  Hallucination None reported or observed  Judgment Limited  Confusion None  Danger to Self  Current suicidal ideation? Denies  Danger to Others  Danger to Others None reported or observed
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
## 2022-07-20 NOTE — Progress Notes (Signed)
   07/20/22 0530  Sleep  Number of Hours 7

## 2022-07-21 DIAGNOSIS — F251 Schizoaffective disorder, depressive type: Secondary | ICD-10-CM | POA: Diagnosis not present

## 2022-07-21 LAB — CBC WITH DIFFERENTIAL/PLATELET
Abs Immature Granulocytes: 0.03 10*3/uL (ref 0.00–0.07)
Basophils Absolute: 0 10*3/uL (ref 0.0–0.1)
Basophils Relative: 1 %
Eosinophils Absolute: 0.2 10*3/uL (ref 0.0–0.5)
Eosinophils Relative: 4 %
HCT: 38.9 % (ref 36.0–46.0)
Hemoglobin: 12.4 g/dL (ref 12.0–15.0)
Immature Granulocytes: 1 %
Lymphocytes Relative: 31 %
Lymphs Abs: 1.3 10*3/uL (ref 0.7–4.0)
MCH: 26.2 pg (ref 26.0–34.0)
MCHC: 31.9 g/dL (ref 30.0–36.0)
MCV: 82.2 fL (ref 80.0–100.0)
Monocytes Absolute: 0.2 10*3/uL (ref 0.1–1.0)
Monocytes Relative: 5 %
Neutro Abs: 2.4 10*3/uL (ref 1.7–7.7)
Neutrophils Relative %: 58 %
Platelets: 237 10*3/uL (ref 150–400)
RBC: 4.73 MIL/uL (ref 3.87–5.11)
RDW: 13.2 % (ref 11.5–15.5)
WBC: 4.1 10*3/uL (ref 4.0–10.5)
nRBC: 0 % (ref 0.0–0.2)

## 2022-07-21 MED ORDER — CLOZAPINE 25 MG PO TABS
325.0000 mg | ORAL_TABLET | Freq: Every day | ORAL | Status: DC
  Administered 2022-07-21: 325 mg via ORAL
  Filled 2022-07-21 (×3): qty 1

## 2022-07-21 NOTE — Plan of Care (Signed)

## 2022-07-21 NOTE — Progress Notes (Signed)
   07/21/22 0800  Psych Admission Type (Psych Patients Only)  Admission Status Voluntary  Psychosocial Assessment  Patient Complaints None  Eye Contact Fair  Facial Expression Other (Comment) (Slightl bright)  Affect Appropriate to circumstance  Speech Soft  Interaction Cautious;Guarded  Motor Activity Slow  Appearance/Hygiene Unremarkable  Behavior Characteristics Cooperative  Mood Suspicious  Aggressive Behavior  Effect No apparent injury  Thought Process  Coherency WDL  Content Blaming others  Delusions Paranoid  Perception WDL  Hallucination None reported or observed  Judgment Limited  Confusion None  Danger to Self  Current suicidal ideation? Denies  Self-Injurious Behavior No self-injurious ideation or behavior indicators observed or expressed   Agreement Not to Harm Self Yes  Description of Agreement Verbal  Danger to Others  Danger to Others None reported or observed
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
## 2022-07-21 NOTE — Progress Notes (Signed)
   07/21/22 2130  Psych Admission Type (Psych Patients Only)  Admission Status Voluntary  Psychosocial Assessment  Patient Complaints None  Eye Contact Fair  Facial Expression Flat  Affect Appropriate to circumstance  Speech Soft  Interaction Cautious;Guarded;Childlike  Motor Activity Slow  Appearance/Hygiene Unremarkable  Aggressive Behavior  Effect No apparent injury  Thought Process  Coherency WDL  Content Blaming others  Delusions Paranoid  Perception WDL  Hallucination None reported or observed  Judgment Limited  Confusion None  Danger to Self  Current suicidal ideation? Denies  Danger to Others  Danger to Others None reported or observed

## 2022-07-21 NOTE — Progress Notes (Signed)
Adult Psychoeducational Group Note  Date:  07/21/2022 Time:  8:36 PM  Group Topic/Focus:  Wrap-Up Group:   The focus of this group is to help patients review their daily goal of treatment and discuss progress on daily workbooks.  Participation Level:  Active  Participation Quality:  Appropriate  Affect:  Appropriate  Cognitive:  Appropriate  Insight: Appropriate  Engagement in Group:  Improving  Modes of Intervention:  Discussion  Additional Comments:   Pt stated her goal for today was to focus on her treatment plan. Pt stated she accomplished her goal today. Pt stated she talked with her doctor and with her social worker about her care today. Pt rated her overall day a 4 out of 10. Pt stated she made no calls today. Pt stated she felt better about herself tonight. Pt stated she was able to attend all meals today. Pt stated she took all medications provided today. Pt stated her appetite was fair today. Pt rated her sleep last night was fair. Pt stated the goal tonight was to get some rest. Pt stated she had no physical pain tonight. Pt deny visual hallucinations and auditory issues tonight. Pt denies thoughts of harming herself or others. Pt stated she would alert staff if anything changed.  Cheryl Adams 07/21/2022, 8:36 PM

## 2022-07-21 NOTE — Progress Notes (Signed)
   07/21/22 0530  Sleep  Number of Hours 7.25

## 2022-07-22 DIAGNOSIS — F251 Schizoaffective disorder, depressive type: Secondary | ICD-10-CM | POA: Diagnosis not present

## 2022-07-22 LAB — CLOZAPINE (CLOZARIL)
Clozapine Lvl: 293 ng/mL — ABNORMAL LOW (ref 350–600)
NorClozapine: 152 ng/mL
Total(Cloz+Norcloz): 445 ng/mL

## 2022-07-22 MED ORDER — CLOZAPINE 25 MG PO TABS
350.0000 mg | ORAL_TABLET | Freq: Every day | ORAL | Status: DC
  Administered 2022-07-22 – 2022-07-23 (×2): 350 mg via ORAL
  Filled 2022-07-22 (×3): qty 2
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
## 2022-07-22 NOTE — Group Note (Signed)
Recreation Therapy Group Note   Group Topic:Team Building  Group Date: 07/22/2022 Start Time: 1000 End Time: 1045 Facilitators: Caroll Rancher, LRT,CTRS Location: [address] Dayroom   Goal Area(s) Addresses:  Patient will effectively work with peer towards shared goal.  Patient will identify skills used to make activity successful.  Patient will identify how skills used during activity can be applied to reach post d/c goals.   Group Description: [employer]. In teams of 5-6, patients were given 12 craft pipe cleaners. Using the materials provided, patients were instructed to compete again the opposing team(s) to build the tallest free-standing structure from floor level. The activity was timed; difficulty increased by Clinical research associate as Production designer, theatre/television/film continued.  Systematically resources were removed with additional directions for example, placing one arm behind their back, working in silence, and shape stipulations. LRT facilitated post-activity discussion reviewing team processes and necessary communication skills involved in completion. Patients were encouraged to reflect how the skills utilized, or not utilized, in this activity can be incorporated to positively impact support systems post discharge.   Affect/Mood: Appropriate   Participation Level: Engaged   Participation Quality: Independent   Behavior: Appropriate   Speech/Thought Process: Focused   Insight: Good   Judgement: Good   Modes of Intervention: Team-building   Patient Response to Interventions:  Engaged   Education Outcome:  Acknowledges education and In group clarification offered    Clinical Observations/Individualized Feedback: Pt did well in working with peers to complete the activity.  Pt was quiet for the most part but attentive.  Pt did not participate in discussion.    Plan: Continue to engage patient in RT group sessions 2-3x/week.   Caroll Rancher, Antonietta Jewel 07/22/2022 12:51 PM

## 2022-07-22 NOTE — Progress Notes (Signed)
   07/22/22 2015  Psych Admission Type (Psych Patients Only)  Admission Status Voluntary  Psychosocial Assessment  Patient Complaints None  Eye Contact Fair  Facial Expression Flat  Affect Appropriate to circumstance  Speech Soft  Interaction Cautious;Guarded;Childlike  Motor Activity Slow  Appearance/Hygiene Unremarkable  Behavior Characteristics Cooperative  Mood Suspicious;Preoccupied  Aggressive Behavior  Effect No apparent injury  Thought Process  Coherency WDL  Content Blaming others  Delusions Paranoid  Perception WDL  Hallucination None reported or observed  Judgment Limited  Confusion None  Danger to Self  Current suicidal ideation? Denies  Danger to Others  Danger to Others None reported or observed

## 2022-07-22 NOTE — Group Note (Signed)
LCSW Group Therapy Note  Group Date: 07/22/2022 Start Time: 1300 End Time: 1400   Type of Therapy and Topic:  Group Therapy: Anger Cues and Responses  Participation Level:  Minimal   Description of Group:   In this group, patients learned how to recognize the physical, cognitive, emotional, and behavioral responses they have to anger-provoking situations.  They identified a recent time they became angry and how they reacted.  They analyzed how their reaction was possibly beneficial and how it was possibly unhelpful.  The group discussed a variety of healthier coping skills that could help with such a situation in the future.  Focus was placed on how helpful it is to recognize the underlying emotions to our anger, because working on those can lead to a more permanent solution as well as our ability to focus on the important rather than the urgent.  Therapeutic Goals: Patients will remember their last incident of anger and how they felt emotionally and physically, what their thoughts were at the time, and how they behaved. Patients will identify how their behavior at that time worked for them, as well as how it worked against them. Patients will explore possible new behaviors to use in future anger situations. Patients will learn that anger itself is normal and cannot be eliminated, and that healthier reactions can assist with resolving conflict rather than worsening situations.  Summary of Patient Progress:  Cheryl Adams was present during the group and participated minimally. She shared a recent occurrence of having lots of thoughts and feeling hungry could make her angry. She demonstrated fair insight into the subject matter, was respectful of peers, and participated throughout the entire session.  Patient states that a safe space to calm her down is walking the trails.  Therapeutic Modalities:   Cognitive Behavioral Therapy    Beatris Si, LCSW 07/22/2022  2:14 PM

## 2022-07-22 NOTE — BHH Counselor (Signed)
CSW spoke with Mrs. Amoroso (Mother) about the possibility of a family meeting on 07/23/2022 at 3:00pm.  Mrs. Canner agreed to this meeting and states that she is awaiting a phone call from her brother (the [name] uncle) to make sure that he can also be available at that time. Mrs. Sangiovanni states that once she confirms the time and date with her brother she will contact the CSW or leave a voicemail on the CSW's work phone.  CSW will update the providers as information is obtained.

## 2022-07-22 NOTE — BHH Group Notes (Signed)
Adult Psychoeducational Group Note  Date:  07/22/2022 Time:  8:22 PM  Group Topic/Focus:  Healthy Communication:   The focus of this group is to discuss communication, barriers to communication, as well as healthy ways to communicate with others.  Participation Level:  Minimal  Participation Quality:  Appropriate  Affect:  Appropriate  Cognitive:  Appropriate  Insight: Improving  Engagement in Group:  Improving  Modes of Intervention:  Discussion  Additional Comments  Cheryl Adams 07/22/2022, 8:22 PM

## 2022-07-22 NOTE — Progress Notes (Signed)
Pt showered with assistance from MHT

## 2022-07-22 NOTE — [facility] Group Notes (Signed)
Adult Psychoeducational Group Note  Date:  07/22/2022 Time:  9:42 AM  Group Topic/Focus:  Goals Group:   The focus of this group is to help patients establish daily goals to achieve during treatment and discuss how the patient can incorporate goal setting into their daily lives to aide in recovery.  Participation Level:  Minimal  Participation Quality:  Drowsy  Additional Comments:  Pt had no goal to share this morning.   Cheryl Adams 07/22/2022, 9:42 AM

## 2022-07-22 NOTE — Progress Notes (Signed)
   07/22/22 0515  Sleep  Number of Hours 8.5

## 2022-07-23 DIAGNOSIS — F251 Schizoaffective disorder, depressive type: Secondary | ICD-10-CM | POA: Diagnosis not present
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
## 2022-07-23 NOTE — B[facility] Group Notes (Signed)
Adult Psychoeducational Group Note  Date:  07/23/2022 Time:  8:16 PM  Group Topic/Focus:  Wrap-Up Group:   The focus of this group is to help patients review their daily goal of treatment and discuss progress on daily workbooks.  Participation Level:  Active  Participation Quality:  Attentive  Affect:  Appropriate  Cognitive:  Alert  Insight: Appropriate  Engagement in Group:  Engaged  Modes of Intervention:  Discussion  Additional Comments:  Patient attended and participated in the Wrap-up group.  Cheryl Adams 07/23/2022, 8:16 PM

## 2022-07-23 NOTE — Progress Notes (Signed)
Adult Psychoeducational Group Note  Date:  07/23/2022 Time:  9:21 AM  Group Topic/Focus:  Orientation:   The focus of this group is to educate the patient on the purpose and policies of crisis stabilization and provide a format to answer questions about their admission.  The group details unit policies and expectations of patients while admitted.  Participation Level:  Active  Participation Quality:  Appropriate  Affect:  Appropriate  Cognitive:  Appropriate  Insight: Appropriate  Engagement in Group:  Engaged  Modes of Intervention:  Discussion  Additional Comments:  Patient attended morning orientation group and said her goal for today is communicate more in group.   Jennye Runquist W Aveena Bari 07/23/2022, 9:21 AM

## 2022-07-23 NOTE — Group Note (Signed)
Occupational Therapy Group Note  Group Topic:Stress Management  Group Date: 07/23/2022 Start Time: 1400 End Time: 1455 Facilitators: Ted Mcalpine, OT   Group Description: Group encouraged increased participation and engagement through discussion focused on topic of stress management. Patients engaged interactively to discuss components of stress including physical signs, emotional signs, negative management strategies, and positive management strategies. Each individual identified one new stress management strategy they would like to try moving forward.    Therapeutic Goals: Identify current stressors Identify healthy vs unhealthy stress management strategies/techniques Discuss and identify physical and emotional signs of stress   Participation Level: Active and Engaged   Participation Quality: Independent   Behavior: Alert and Appropriate   Speech/Thought Process: Coherent and Directed   Affect/Mood: Appropriate   Insight: Fair   Judgement: Fair   Individualization: pt was engaged in their participation of group discussion/activity. New skills were identified  Modes of Intervention: Discussion and Education  Patient Response to Interventions:  Attentive and Engaged   Plan: Continue to engage patient in OT groups 2 - 3x/week.  07/23/2022  Ted Mcalpine, OT  Kerrin Champagne, OT

## 2022-07-23 NOTE — B[facility] Counselor (Signed)
CSW, Dr. Caswell Corwin, and a student doctor met with Mrs. Dymek, the [name] Uncle Johnny, and with the Pt.  The Pt would not hug or look at her mother and uncle during the meeting.  Mrs. Molchan gave her daughter a letter written by her sister and the Pt would not touch the letter.  The Pt was asked by CSW to inform her mother of where she would like to live after discharge.  The Pt stated to her mother and uncle that she wants to live at a group home or a shelter.  Mrs. Pote stated to her daughter that she would look into the option of a group home if that is where she would feel safe.  The doctor and the pt's mother attempted to speak with the pt about how dangerous a shelter would be for her.  The [name] uncle offered to take her to [employer] for 2 weeks and stated "she will feel better if she just has a vacation". The Pt asked to be excused from the meeting.  CSW took the Pt back to her room and placed the letter from her sister in the [name] green chart at the nurses station.  CSW returned to the meeting with the [name] parents where the doctor explained the diagnosis and the symptoms.  The doctor also explained to Mrs. Joslin that her daughter will need to take her medications daily and that if she stops them then her symptoms may get worse.  Mrs. Vicente states that the ACT Team has spoken with her and she states that her daughter was placed on the agency's wait list for ACTT services.  Mrs. Postma also states that she sent a letter on her daughter's behalf and was able to get her dismissed from [employer].  Towards the end of the meeting Mrs. Insco began to cry and stated that she wanted to end the meeting and get some "fresh air".  CSW walked Mrs. Clagg and her brother to the lobby and informed them to call with any questions or concerns.  Mrs. Glasscock has a court date for emergent guardianship on 08/04/2022.  CSW will continue to maintain contact with Mrs. Blankenbeckler and will begin looking at group homes once Disability and Medicaid are  applied for by Mrs. Guardado and received by the Pt.

## 2022-07-23 NOTE — Progress Notes (Signed)
   07/23/22 0500  Sleep  Number of Hours 7.5

## 2022-07-23 NOTE — Progress Notes (Signed)
   07/23/22 2000  Psych Admission Type (Psych Patients Only)  Admission Status Voluntary  Psychosocial Assessment  Patient Complaints None  Eye Contact Fair  Facial Expression Flat  Affect Appropriate to circumstance  Speech Soft  Interaction Cautious;Guarded;Childlike  Motor Activity Slow  Appearance/Hygiene Unremarkable  Behavior Characteristics Cooperative  Mood Suspicious;Preoccupied  Aggressive Behavior  Effect No apparent injury  Thought Process  Coherency WDL  Content Blaming others  Delusions Paranoid  Perception WDL  Hallucination None reported or observed  Judgment Limited  Confusion None  Danger to Self  Current suicidal ideation? Denies  Danger to Others  Danger to Others None reported or observed

## 2022-07-24 ENCOUNTER — Encounter (HOSPITAL_COMMUNITY): Payer: Self-pay

## 2022-07-24 DIAGNOSIS — F251 Schizoaffective disorder, depressive type: Secondary | ICD-10-CM | POA: Diagnosis not present

## 2022-07-24 LAB — CBC WITH DIFFERENTIAL/PLATELET
Abs Immature Granulocytes: 0.04 10*3/uL (ref 0.00–0.07)
Basophils Absolute: 0 10*3/uL (ref 0.0–0.1)
Basophils Relative: 1 %
Eosinophils Absolute: 0.2 10*3/uL (ref 0.0–0.5)
Eosinophils Relative: 4 %
HCT: 39.5 % (ref 36.0–46.0)
Hemoglobin: 12.6 g/dL (ref 12.0–15.0)
Immature Granulocytes: 1 %
Lymphocytes Relative: 31 %
Lymphs Abs: 1.5 10*3/uL (ref 0.7–4.0)
MCH: 26.3 pg (ref 26.0–34.0)
MCHC: 31.9 g/dL (ref 30.0–36.0)
MCV: 82.3 fL (ref 80.0–100.0)
Monocytes Absolute: 0.3 10*3/uL (ref 0.1–1.0)
Monocytes Relative: 6 %
Neutro Abs: 2.7 10*3/uL (ref 1.7–7.7)
Neutrophils Relative %: 57 %
Platelets: 257 10*3/uL (ref 150–400)
RBC: 4.8 MIL/uL (ref 3.87–5.11)
RDW: 13.5 % (ref 11.5–15.5)
WBC: 4.7 10*3/uL (ref 4.0–10.5)
nRBC: 0 % (ref 0.0–0.2)

## 2022-07-24 MED ORDER — VITAMIN D3 25 MCG PO TABS
1000.0000 [IU] | ORAL_TABLET | Freq: Every day | ORAL | Status: DC
Start: 2022-07-24 — End: 2022-09-10
  Administered 2022-07-24 – 2022-09-10 (×49): 1000 [IU] via ORAL
  Filled 2022-07-24 (×51): qty 1

## 2022-07-24 MED ORDER — LUMATEPERONE TOSYLATE 42 MG PO CAPS
42.0000 mg | ORAL_CAPSULE | Freq: Every day | ORAL | Status: DC
  Administered 2022-07-24 – 2022-08-05 (×13): 42 mg via ORAL
  Filled 2022-07-24 (×17): qty 1

## 2022-07-24 MED ORDER — CLOZAPINE 100 MG PO TABS
375.0000 mg | ORAL_TABLET | Freq: Every day | ORAL | Status: AC
Start: 2022-07-24 — End: 2022-07-25
  Administered 2022-07-24 – 2022-07-25 (×2): 375 mg via ORAL
  Filled 2022-07-24 (×2): qty 3

## 2022-07-24 MED ORDER — CLOZAPINE 100 MG PO TABS
400.0000 mg | ORAL_TABLET | Freq: Every day | ORAL | Status: DC
  Administered 2022-07-26 – 2022-07-27 (×2): 400 mg via ORAL
  Filled 2022-07-24 (×4): qty 4
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
## 2022-07-24 NOTE — Progress Notes (Signed)
    07/24/22 2037  Psych Admission Type (Psych Patients Only)  Admission Status Voluntary  Psychosocial Assessment  Patient Complaints None  Eye Contact Fair  Facial Expression Fixed smile  Affect Appropriate to circumstance  Speech Soft;Slow  Interaction Cautious;Childlike;Forwards little  Motor Activity Slow  Appearance/Hygiene Unremarkable  Behavior Characteristics Cooperative  Mood Suspicious;Preoccupied  Thought Process  Coherency WDL  Content Blaming others  Delusions Paranoid  Perception WDL  Hallucination None reported or observed  Judgment Impaired  Confusion None  Danger to Self  Current suicidal ideation? Denies  Danger to Others  Danger to Others None reported or observed

## 2022-07-24 NOTE — [facility] Group Notes (Signed)
Adult Psychoeducational Group Note  Date:  07/24/2022 Time:  8:35 PM  Group Topic/Focus:  Wrap-Up Group:   The focus of this group is to help patients review their daily goal of treatment and discuss progress on daily workbooks.  Participation Level:  Minimal  Participation Quality:  Appropriate  Affect:  Flat  Cognitive:  Alert and Appropriate  Insight: Appropriate and Good  Engagement in Group:  Engaged  Modes of Intervention:  Discussion and Education  Additional Comments:  Pt attended and participated in wrap up group this evening and rated their day a 4/10. Pt states that they meditated, and participated in group. Pt did not have a goal for today, but tomorrow they would like to work on expressing themselves more. Pt has no concerns to report at this time.   Chrisandra Netters 07/24/2022, 8:35 PM

## 2022-07-24 NOTE — [facility] Group Notes (Signed)
[facility] Group Notes:  (Nursing/MHT/Case Management/Adjunct)  Date:  07/24/2022  Time:  9:54 AM  Type of Therapy:   Orientation/Goals group  Participation Level:  Minimal  Participation Quality:  Attentive  Affect:  Appropriate and Flat  Cognitive:  Appropriate  Insight:  Appropriate, Good, and Improving  Engagement in Group:  Engaged and Improving  Modes of Intervention:  Discussion, Education, Orientation, and Support  Summary of Progress/Problems: Pt goal for today is to find a safe place to stay when she gets to leave.   Samy Ryner J Aashish Hamm 07/24/2022, 9:54 AM

## 2022-07-24 NOTE — BH IP Treatment Plan (Signed)
Interdisciplinary Treatment and Diagnostic Plan Update  07/24/2022 Time of Session: 9:55am  Cheryl Adams MRN: [id number]  Principal Diagnosis: Schizoaffective disorder, depressive type (HCC)  Secondary Diagnoses: Principal Problem:   Schizoaffective disorder, depressive type (HCC) Active Problems:   Insomnia   GAD (generalized anxiety disorder)   Vitamin D deficiency   Current Medications:  Current Facility-Administered Medications  Medication Dose Route Frequency Provider Last Rate Last Admin   acetaminophen (TYLENOL) tablet 500 mg  500 mg Oral Q6H PRN Massengill, Harrold Donath, MD       amLODipine (NORVASC) tablet 10 mg  10 mg Oral Daily Massengill, Harrold Donath, MD   10 mg at 07/24/22 6546   antiseptic oral rinse (BIOTENE) solution 15 mL  15 mL Mouth Rinse 5 X Daily PRN Comer Locket, MD       atenolol (TENORMIN) tablet 25 mg  25 mg Oral Daily Massengill, Harrold Donath, MD   25 mg at 07/24/22 5035   clonazePAM (KLONOPIN) disintegrating tablet 0.5 mg  0.5 mg Oral Daily Massengill, Harrold Donath, MD   0.5 mg at 07/24/22 4656   cloZAPine (CLOZARIL) tablet 375 mg  375 mg Oral QHS Massengill, Harrold Donath, MD       [name] ON 07/26/2022] cloZAPine (CLOZARIL) tablet 400 mg  400 mg Oral QHS Massengill, Nathan, MD       docusate sodium (COLACE) capsule 100 mg  100 mg Oral BID Massengill, Harrold Donath, MD   100 mg at 07/24/22 8127   ferrous sulfate tablet 325 mg  325 mg Oral Daily Princess Bruins, DO   325 mg at 07/24/22 0741   hydrOXYzine (ATARAX) tablet 25 mg  25 mg Oral TID PRN Phineas Inches, MD   25 mg at 06/17/22 2027   OLANZapine zydis (ZYPREXA) disintegrating tablet 5 mg  5 mg Oral Q8H PRN Massengill, Harrold Donath, MD   5 mg at 05/24/22 2154   And   LORazepam (ATIVAN) tablet 1 mg  1 mg Oral Q8H PRN Massengill, Harrold Donath, MD   1 mg at 07/03/22 2150   And   ziprasidone (GEODON) injection 20 mg  20 mg Intramuscular Q8H PRN Massengill, Harrold Donath, MD       lumateperone tosylate (CAPLYTA) capsule 42 mg  42 mg Oral Daily Massengill,  Nathan, MD       metFORMIN (GLUCOPHAGE-XR) 24 hr tablet 500 mg  500 mg Oral Q breakfast Massengill, Nathan, MD   500 mg at 07/24/22 5170   methylphenidate (RITALIN) tablet 10 mg  10 mg Oral Daily Hill, Shelbie Hutching, MD   10 mg at 07/24/22 0741   polyethylene glycol (MIRALAX / GLYCOLAX) packet 17 g  17 g Oral Daily Massengill, Harrold Donath, MD   17 g at 07/24/22 0174   sertraline (ZOLOFT) tablet 100 mg  100 mg Oral Daily Massengill, Harrold Donath, MD   100 mg at 07/24/22 9449   vitamin D3 (CHOLECALCIFEROL) tablet 1,000 Units  1,000 Units Oral Daily Massengill, Harrold Donath, MD       PTA Medications: Medications Prior to Admission  Medication Sig Dispense Refill Last Dose   ferrous sulfate 325 (65 FE) MG tablet Take 325 mg by mouth daily.      risperiDONE (RISPERDAL) 1 MG tablet Take 1 tablet (1 mg total) by mouth daily.      risperiDONE (RISPERDAL) 2 MG tablet Take 1 tablet (2 mg total) by mouth at bedtime.       Patient Stressors: Health problems   Marital or family conflict   Medication change or noncompliance    Patient Strengths:  Average or above average intelligence  Supportive family/friends   Treatment Modalities: Medication Management, Group therapy, Case management,  1 to 1 session with clinician, Psychoeducation, Recreational therapy.   Physician Treatment Plan for Primary Diagnosis: Schizoaffective disorder, depressive type (HCC) Long Term Goal(s): Improvement in symptoms so as ready for discharge   Short Term Goals: Ability to verbalize feelings will improve Ability to disclose and discuss suicidal ideas Ability to identify and develop effective coping behaviors will improve Compliance with prescribed medications will improve  Medication Management: Evaluate patient's response, side effects, and tolerance of medication regimen.  Therapeutic Interventions: 1 to 1 sessions, Unit Group sessions and Medication administration.  Evaluation of Outcomes: Progressing  Physician Treatment  Plan for Secondary Diagnosis: Principal Problem:   Schizoaffective disorder, depressive type (HCC) Active Problems:   Insomnia   GAD (generalized anxiety disorder)   Vitamin D deficiency  Long Term Goal(s): Improvement in symptoms so as ready for discharge   Short Term Goals: Ability to verbalize feelings will improve Ability to disclose and discuss suicidal ideas Ability to identify and develop effective coping behaviors will improve Compliance with prescribed medications will improve     Medication Management: Evaluate patient's response, side effects, and tolerance of medication regimen.  Therapeutic Interventions: 1 to 1 sessions, Unit Group sessions and Medication administration.  Evaluation of Outcomes: Progressing   RN Treatment Plan for Primary Diagnosis: Schizoaffective disorder, depressive type (HCC) Long Term Goal(s): Knowledge of disease and therapeutic regimen to maintain health will improve  Short Term Goals: Ability to remain free from injury will improve, Ability to participate in decision making will improve, Ability to verbalize feelings will improve, Ability to disclose and discuss suicidal ideas, and Ability to identify and develop effective coping behaviors will improve  Medication Management: RN will administer medications as ordered by provider, will assess and evaluate patient's response and provide education to patient for prescribed medication. RN will report any adverse and/or side effects to prescribing provider.  Therapeutic Interventions: 1 on 1 counseling sessions, Psychoeducation, Medication administration, Evaluate responses to treatment, Monitor vital signs and CBGs as ordered, Perform/monitor CIWA, COWS, AIMS and Fall Risk screenings as ordered, Perform wound care treatments as ordered.  Evaluation of Outcomes: Progressing   LCSW Treatment Plan for Primary Diagnosis: Schizoaffective disorder, depressive type (HCC) Long Term Goal(s): Safe transition  to appropriate next level of care at discharge, Engage patient in therapeutic group addressing interpersonal concerns.  Short Term Goals: Engage patient in aftercare planning with referrals and resources, Increase social support, Increase emotional regulation, Facilitate acceptance of mental health diagnosis and concerns, Identify triggers associated with mental health/substance abuse issues, and Increase skills for wellness and recovery  Therapeutic Interventions: Assess for all discharge needs, 1 to 1 time with Social worker, Explore available resources and support systems, Assess for adequacy in community support network, Educate family and significant other(s) on suicide prevention, Complete Psychosocial Assessment, Interpersonal group therapy.  Evaluation of Outcomes: Progressing   Progress in Treatment: Attending groups: Yes. Participating in groups: Yes. Taking medication as prescribed: Yes. Toleration medication: Yes. Family/Significant other contact made: Yes, individual(s) contacted:  Sherlean Foot, mother Patient understands diagnosis: Yes. Discussing patient identified problems/goals with staff: Yes. Medical problems stabilized or resolved: Yes. Denies suicidal/homicidal ideation: No. Issues/concerns per patient self-inventory: Yes. Other: none   New problem(s) identified: No, Describe:  none   New Short Term/Long Term Goal(s):Patient to work towards elimination of symptoms of psychosis, medication management for mood stabilization; development of comprehensive mental wellness plan.  Patient Goals:  No additional goals identified at this time. Patient to continue to work towards original goals identified in initial treatment team meeting. CSW will remain available to patient should they voice additional treatment goals.    Discharge Plan or Barriers: Patient continues to be paranoid of others in her home. Patient may discharge with shelter resources.    Reason for  Continuation of Hospitalization: Other; describe psychosis    Estimated Length of Stay:     Last 3 [place] Suicide Severity Risk Score: Flowsheet Row Ad[facility] (Current) [facility] from 05/18/2022 in [facility] Office Visit from [facility]/14/2022 in [facility]  C-SSRS RISK CATEGORY No Risk High Risk No Risk       Last PHQ 2/9 Scores:    04/14/2022   10:46 AM 02/24/2022   11:12 AM 01/13/2022    9:14 AM  Depression screen PHQ 2/9  Decreased Interest 0 0 0  Down, Depressed, Hopeless 0 0 0  PHQ - 2 Score 0 0 0    Scribe for Treatment Team: Aram Beecham, Theresia Majors 07/24/2022 10:29 AM

## 2022-07-24 NOTE — Group Note (Signed)
Recreation Therapy Group Note   Group Topic:Goal Setting  Group Date: 07/24/2022 Start Time: 1000 End Time: 1045 Facilitators: Caroll Rancher, LRT,CTRS Location: [address] Dayroom   Goal Area(s) Addresses:  Patient will listen on 1 prompt. Patient will participate in discussion of what a goal is. Patient will successfully complete their self inventory sheet.  Group Description: LRT and patients discussed the importance of goals and how they impact one's life.  Patients were then given a worksheet where they were to identify goals they wanted to accomplish in the next week, month, year and five years.  Patients would then identify obstacles reaching their goals, what they need to achieve goals and what they can start doing now to work towards those goals.   Affect/Mood: Appropriate   Participation Level: Engaged   Participation Quality: Independent   Behavior: Appropriate   Speech/Thought Process: Focused   Insight: Good   Judgement: Good   Modes of Intervention: Worksheet   Patient Response to Interventions:  Engaged   Education Outcome:  Acknowledges education and In group clarification offered    Clinical Observations/Individualized Feedback: Pt was engaged and appeared mellow during group session.  Pt identified goals she wants to accomplish as: in a week build personal and professional relationships.  In a month, go shopping and focus on personal care.  In a year, pt wants to travel and in five years, "grow in family/friends.  Pt named obstacles as communicating wants and needs better.  Pt expressed needing financial security to reach goals and can start today by journaling.    Plan: Continue to engage patient in RT group sessions 2-3x/week.   Caroll Rancher, LRT,CTRS 07/24/2022 1:48 PM

## 2022-07-24 NOTE — [facility] Group Notes (Signed)
  Spirituality group facilitated by Kathleen Argue, BCC.   Group Description: Group focused on topic of hope. Patients participated in facilitated discussion around topic, connecting with one another around experiences and definitions for hope. Group members engaged with visual explorer photos, reflecting on what hope looks like for them today. Group engaged in discussion around how their definitions of hope are present today in hospital.   Modalities: Psycho-social ed, Adlerian, Narrative, MI   Patient Progress: Cheryl Adams attended group and actively participated and engaged in group conversation.  Her comments were on topic and appropriate and showed insight into the topic.  [address], Bcc Pager, [phone]

## 2022-07-24 NOTE — Group Note (Signed)
LCSW Group Therapy Note  Group Date: 07/24/2022 Start Time: 1300 End Time: 1400   Type of Therapy and Topic:  Group Therapy - How To Cope with Nervousness about Discharge   Participation Level:  Active   Description of Group This process group involved identification of patients' feelings about discharge. Some of them are scheduled to be discharged soon, while others are new admissions, but each of them was asked to share thoughts and feelings surrounding discharge from the hospital. One common theme was that they are excited at the prospect of going home, while another was that many of them are apprehensive about sharing why they were hospitalized. Patients were given the opportunity to discuss these feelings with their peers in preparation for discharge.  Therapeutic Goals  Patient will identify their overall feelings about pending discharge. Patient will think about how they might proactively address issues that they believe will once again arise once they get home (i.e. with parents). Patients will participate in discussion about having hope for change.   Summary of Patient Progress:  Cheryl Adams was very active throughout the session. Cheryl Adams demonstrated good insight into the subject matter, and proved open to input from peers and feedback from CSW. Cheryl Adams was respectful of peers and participated throughout the entire session.  Pt discussed feeling overwhelmed and anxious about discharge because she doesn't know what to expect and she doesn't have a plan.    Therapeutic Modalities Cognitive Behavioral Therapy   Beatris Si, LCSW 07/24/2022  12:11 PM

## 2022-07-24 NOTE — Progress Notes (Signed)
   07/24/22 0515  Sleep  Number of Hours 8.5

## 2022-07-24 NOTE — Progress Notes (Signed)
   07/24/22 1100  Psych Admission Type (Psych Patients Only)  Admission Status Voluntary  Psychosocial Assessment  Patient Complaints None  Eye Contact Fair  Facial Expression Fixed smile  Affect Appropriate to circumstance  Speech Soft  Interaction Cautious;Childlike;Guarded  Motor Activity Slow  Appearance/Hygiene Improved  Behavior Characteristics Cooperative  Mood Suspicious;Preoccupied  Aggressive Behavior  Effect No apparent injury  Thought Process  Coherency WDL  Content Blaming others  Delusions Paranoid  Perception WDL  Hallucination None reported or observed  Judgment Limited  Confusion None  Danger to Self  Current suicidal ideation? Denies  Self-Injurious Behavior No self-injurious ideation or behavior indicators observed or expressed   Agreement Not to Harm Self Yes  Description of Agreement Verbal  Danger to Others  Danger to Others None reported or observed

## 2022-07-25 DIAGNOSIS — F333 Major depressive disorder, recurrent, severe with psychotic symptoms: Secondary | ICD-10-CM | POA: Diagnosis not present

## 2022-07-25 NOTE — Progress Notes (Signed)
   07/25/22 2100  Psych Admission Type (Psych Patients Only)  Admission Status Voluntary  Psychosocial Assessment  Patient Complaints None  Eye Contact Brief  Facial Expression Flat  Affect Appropriate to circumstance  Speech Soft;Slow  Interaction Cautious;Childlike  Motor Activity Slow  Appearance/Hygiene Improved  Behavior Characteristics Cooperative;Appropriate to situation  Mood Preoccupied;Pleasant  Thought Process  Coherency WDL  Content Blaming others  Delusions Paranoid  Perception WDL  Hallucination None reported or observed  Judgment Limited  Confusion None  Danger to Self  Current suicidal ideation? Denies  Danger to Others  Danger to Others None reported or observed

## 2022-07-25 NOTE — Progress Notes (Signed)
   07/25/22 1400  Psych Admission Type (Psych Patients Only)  Admission Status Voluntary  Psychosocial Assessment  Patient Complaints None  Eye Contact Fair  Facial Expression Fixed smile  Affect Appropriate to circumstance  Speech Soft;Slow  Interaction Cautious;Childlike;Guarded  Motor Activity Slow  Appearance/Hygiene Improved  Behavior Characteristics Cooperative  Mood Suspicious;Preoccupied  Aggressive Behavior  Effect No apparent injury  Thought Process  Coherency WDL  Content Blaming others  Delusions Paranoid  Perception WDL  Hallucination None reported or observed  Judgment Limited  Confusion None  Danger to Self  Current suicidal ideation? Denies  Self-Injurious Behavior No self-injurious ideation or behavior indicators observed or expressed   Agreement Not to Harm Self Yes  Description of Agreement Verbal  Danger to Others  Danger to Others None reported or observed
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
## 2022-07-25 NOTE — Group Note (Signed)
[facility] LCSW Group Therapy Note   Type of Therapy and Topic:  Group Therapy:  Healthy vs Unhealthy Coping Skills  Participation Level:  Minimal   Description of Group:  The focus of this group was to determine what unhealthy and healthy coping techniques typically are used by group members and why they tend to fall back on those particular techniques of handling hard situations. Patients were guided in becoming aware of the differences between healthy and unhealthy coping techniques.  Facilitator led a discussion about the benefits and costs of returning to old unhealthy coping techniques, as well as the benefits and costs of learning new healthier coping skills.  Therapeutic Goals Patients learned that coping is what human beings do all day long to deal with various situations in their lives Patients defined and discussed healthy vs unhealthy coping techniques Patients came to understand the the reasons human beings often return to old coping techniques that they know are unhelpful Patients were provided with motivation to consider new means of coping that may be more healthy and helpful Patients provided support and ideas to each other  Summary of Patient Progress: During group, patient expressed that one coping skill typically used if upset is to listen to music.  The patient was late to group, but she did participate fully while present.   Therapeutic Modalities Psychoeducation Motivational Interviewing   Ambrose Mantle, LCSW 07/25/2022, 11:45 AM

## 2022-07-25 NOTE — [facility] Group Notes (Signed)
Goals Group 07/25/2022   Group Focus: affirmation, clarity of thought, and goals/reality orientation Treatment Modality:  Psychoeducation Interventions utilized were assignment, group exercise, and support Purpose: To be able to understand and verbalize the reason for their admission to the hospital. To understand that the medication helps with their chemical imbalance but they also need to work on their choices in life. To be challenged to develop a list of 30 positives about themselves. Also introduce the concept that "feelings" are not reality.  Participation Level:  Active  Participation Quality:  Appropriate  Affect:  Appropriate  Cognitive:  Appropriate  Insight:  Improving  Engagement in Group:  Engaged  Additional Comments:  Pt rates her energy at a 4/10. Pt was able to write 20 positive things about herself today. When she has done this group before, she was able to write 20 positives about herself. Wrote 10 positives.  Dione Housekeeper

## 2022-07-25 NOTE — Progress Notes (Signed)
Adult Psychoeducational Group Note  Date:  07/25/2022 Time:  9:07 PM  Group Topic/Focus:  Wrap-Up Group:   The focus of this group is to help patients review their daily goal of treatment and discuss progress on daily workbooks.  Participation Level:  Active  Participation Quality:  Appropriate and Attentive  Affect:  Appropriate  Cognitive:  Alert and Appropriate  Insight: Appropriate and Improving  Engagement in Group:  Engaged  Modes of Intervention:  Discussion  Additional Comments:   Pt states that she had a good day today. Pt states that she was able to go to all her groups and talked to her doctors today. Pt denied everything.  Vevelyn Pat 07/25/2022, 9:07 PM

## 2022-07-26 DIAGNOSIS — F251 Schizoaffective disorder, depressive type: Secondary | ICD-10-CM | POA: Diagnosis not present

## 2022-07-26 NOTE — Plan of Care (Signed)
Nurse discussed anxiety, depression and coping skills with patient.  

## 2022-07-26 NOTE — [facility] Group Notes (Signed)
Adult Psychoeducational Group Note Date:  07/19/2022 Time:  4827-0786 Group Topic/Focus: PROGRESSIVE RELAXATION. A group where deep breathing is taught and tensing and relaxation muscle groups is used. Imagery is used as well.  Pts are asked to imagine 3 pillars that hold them up when they are not able to hold themselves up and to share that with the group.  Participation Level:  Active  Participation Quality:  Appropriate  Affect:  Appropriate  Cognitive:  Oriented  Insight: Improving  Engagement in Group:  Engaged  Modes of Intervention:  Activity, Discussion, Education, and Support  Additional Comments:  Rates her energy at a 4/10. States music holds her up.  Dione Housekeeper
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
## 2022-07-26 NOTE — Progress Notes (Signed)
Adult Psychoeducational Group Note  Date:  07/26/2022 Time:  9:10 PM  Group Topic/Focus:  Wrap-Up Group:   The focus of this group is to help patients review their daily goal of treatment and discuss progress on daily workbooks.  Participation Level:  Active  Participation Quality:  Appropriate  Affect:  Appropriate  Cognitive:  Appropriate  Insight: Appropriate  Engagement in Group:  Improving  Modes of Intervention:  Discussion  Additional Comments:   Pt stated her goal for today was to focus on her treatment plan. Pt stated she accomplished her goal today. Pt stated she did not talked with her doctor or with her social worker about her care today. Pt rated her overall day a 4 out of 10. Pt stated she made no calls today. Pt stated she felt better about herself tonight. Pt stated she was able to attend all meals today. Pt stated she took all medications provided today. Pt stated her appetite was fair today. Pt rated her sleep last night was fair. Pt stated the goal tonight was to get some rest. Pt stated she had no physical pain tonight. Pt deny visual hallucinations and auditory issues tonight. Pt denies thoughts of harming herself or others. Pt stated she would alert staff if anything changed.  Cheryl Adams 07/26/2022, 9:10 PM

## 2022-07-26 NOTE — Progress Notes (Signed)
   07/26/22 2134  Psych Admission Type (Psych Patients Only)  Admission Status Voluntary  Psychosocial Assessment  Patient Complaints None  Eye Contact Brief  Facial Expression Flat  Affect Appropriate to circumstance  Speech Soft;Slow  Interaction Cautious;Childlike  Motor Activity Slow  Appearance/Hygiene Improved  Behavior Characteristics Cooperative  Mood Pleasant  Thought Process  Coherency WDL  Content Blaming others  Delusions Paranoid  Perception WDL  Hallucination None reported or observed  Judgment Limited  Confusion None  Danger to Self  Current suicidal ideation? Denies  Danger to Others  Danger to Others None reported or observed

## 2022-07-26 NOTE — Progress Notes (Addendum)
D:  Patient's self inventory sheet, patient has fair sleep, sleep medication given.   Fair appetite, normal energy level, good concentration.  Rated depression, hopeless and anxiety as #4.  Denied withdrawals.  Denied SI.  Denied physical problems.  Denied physical pain.   A:  Medications administered per MD orders.  Emotional support and encouragement given patient.. R:  Denied SI and HI, contracts for safety.  Denied A/V hallucinations.   Safety maintained with 15 minute checks.  Patient had BM this morning.   Family meeting yesterday.

## 2022-07-26 NOTE — Group Note (Signed)
[facility] LCSW Group Therapy Note  Date/Time:  07/26/2022  11:00AM-12:00PM  Type of Therapy and Topic:  Group Therapy:  Music and Mood  Participation Level:  Minimal   Description of Group: In this process group, members listened to a variety of genres of music and identified that different types of music evoke different responses.  Patients were encouraged to identify music that was soothing for them and music that was energizing for them.  Patients discussed how this knowledge can help with wellness and recovery in various ways including managing depression and anxiety as well as encouraging healthy sleep habits.    Therapeutic Goals: Patients will explore the impact of different varieties of music on mood Patients will verbalize the thoughts they have when listening to different types of music Patients will identify music that is soothing to them as well as music that is energizing to them Patients will discuss how to use this knowledge to assist in maintaining wellness and recovery Patients will explore the use of music as a coping skill  Summary of Patient Progress:  At the beginning of group, patient expressed her mood was "rested."  By the end of group, patient had left the room and not returned.  Therapeutic Modalities: Solution Focused Brief Therapy Activity   Ambrose Mantle, LCSW

## 2022-07-27 DIAGNOSIS — I10 Essential (primary) hypertension: Secondary | ICD-10-CM | POA: Diagnosis not present

## 2022-07-27 DIAGNOSIS — Z20822 Contact with and (suspected) exposure to covid-19: Secondary | ICD-10-CM | POA: Diagnosis not present

## 2022-07-27 DIAGNOSIS — F251 Schizoaffective disorder, depressive type: Secondary | ICD-10-CM | POA: Diagnosis not present

## 2022-07-27 DIAGNOSIS — F061 Catatonic disorder due to known physiological condition: Secondary | ICD-10-CM | POA: Diagnosis not present

## 2022-07-27 LAB — CBC WITH DIFFERENTIAL/PLATELET
Abs Immature Granulocytes: 0.04 10*3/uL (ref 0.00–0.07)
Basophils Absolute: 0 10*3/uL (ref 0.0–0.1)
Basophils Relative: 1 %
Eosinophils Absolute: 0.2 10*3/uL (ref 0.0–0.5)
Eosinophils Relative: 5 %
HCT: 38 % (ref 36.0–46.0)
Hemoglobin: 12 g/dL (ref 12.0–15.0)
Immature Granulocytes: 1 %
Lymphocytes Relative: 34 %
Lymphs Abs: 1.6 10*3/uL (ref 0.7–4.0)
MCH: 26.1 pg (ref 26.0–34.0)
MCHC: 31.6 g/dL (ref 30.0–36.0)
MCV: 82.8 fL (ref 80.0–100.0)
Monocytes Absolute: 0.3 10*3/uL (ref 0.1–1.0)
Monocytes Relative: 7 %
Neutro Abs: 2.6 10*3/uL (ref 1.7–7.7)
Neutrophils Relative %: 52 %
Platelets: 237 10*3/uL (ref 150–400)
RBC: 4.59 MIL/uL (ref 3.87–5.11)
RDW: 13.5 % (ref 11.5–15.5)
WBC: 4.9 10*3/uL (ref 4.0–10.5)
nRBC: 0 % (ref 0.0–0.2)

## 2022-07-27 LAB — VITAMIN D 25 HYDROXY (VIT D DEFICIENCY, FRACTURES): Vit D, 25-Hydroxy: 35.75 ng/mL (ref 30–100)

## 2022-07-27 NOTE — Progress Notes (Signed)
Adult Psychoeducational Group Note  Date:  07/27/2022 Time:  9:14 PM  Group Topic/Focus:  Wrap-Up Group:   The focus of this group is to help patients review their daily goal of treatment and discuss progress on daily workbooks.  Participation Level:  Active  Participation Quality:  Appropriate  Affect:  Appropriate  Cognitive:  Appropriate  Insight: Appropriate  Engagement in Group:  Improving  Modes of Intervention:  Discussion  Additional Comments:  Pt stated her goal for today was to focus on her treatment plan. Pt stated she accomplished her goal today. Pt stated she talked with her doctor and with her social worker about her care today. Pt rated her overall day a 4 out of 10. Pt stated she made no calls today. Pt stated she felt better about herself tonight. Pt stated she was able to attend all meals today. Pt stated she took all medications provided today. Pt stated her appetite was fair today. Pt rated her sleep last night was fair. Pt stated the goal tonight was to get some rest. Pt stated she had no physical pain tonight. Pt deny visual hallucinations and auditory issues tonight. Pt denies thoughts of harming herself or others. Pt stated she would alert staff if anything changed.    Felipa Furnace 07/27/2022, 9:14 PM

## 2022-07-27 NOTE — Group Note (Addendum)
Recreation Therapy Group Note   Group Topic:Coping Skills  Group Date: 07/27/2022 Start Time: 1000 End Time: 1035 Facilitators: Caroll Rancher, LRT,CTRS Location: [address] Dayroom   Goal Area(s) Addresses:  Patient will identify positive coping skill techniques. Patient will identify benefits of using positive coping skills post d/c.  Group Description: Mind Map.  Patient was provided a blank template of a diagram with 32 blank boxes in a tiered system, branching from the center (similar to a bubble chart). LRT directed patients to label the middle of the diagram "Coping Skills" and consider 8 different sources in which coping skills would be needed.  Patients and LRT filled in the first 8 boxes together with 8 sources coping skills could be used (anger, stress, isolation, emotions, depression, anxiety, relationships and communication/cooperation.  Patients were to then come up with 3 effective coping techniques to address each identified area in the remaining boxes stemming from a particular source. Pts were encouraged to share ideas with one another and ask for suggestions of peers and Clinical research associate when stuck on a certain category.   Affect/Mood: Appropriate   Participation Level: Engaged   Participation Quality: Independent   Behavior: Appropriate   Speech/Thought Process: Focused   Insight: Good   Judgement: Good   Modes of Intervention: Worksheet   Patient Response to Interventions:  Engaged   Education Outcome:  Acknowledges education and In group clarification offered    Clinical Observations/Individualized Feedback: Pt was quiet but engaged when prompted.  Pt was attentive and focused on the responses from peers.  Pt came up with coping such as yoga, taking a nap, stay calm, collage of things you like to do, going to social events and listening.     Plan: Continue to engage patient in RT group sessions 2-3x/week.   Caroll Rancher, Antonietta Jewel 07/27/2022 12:59 PM

## 2022-07-27 NOTE — Group Note (Signed)
LCSW Group Therapy Note   Group Date: 07/27/2022 Start Time: 1300 End Time: 1400   Type of Therapy and Topic:  Group Therapy: Boundaries  Participation Level:  Did Not Attend  Description of Group: This group will address the use of boundaries in their personal lives. Patients will explore why boundaries are important, the difference between healthy and unhealthy boundaries, and negative and postive outcomes of different boundaries and will look at how boundaries can be crossed.  Patients will be encouraged to identify current boundaries in their own lives and identify what kind of boundary is being set. Facilitators will guide patients in utilizing problem-solving interventions to address and correct types boundaries being used and to address when no boundary is being used. Understanding and applying boundaries will be explored and addressed for obtaining and maintaining a balanced life. Patients will be encouraged to explore ways to assertively make their boundaries and needs known to significant others in their lives, using other group members and facilitator for role play, support, and feedback.  Therapeutic Goals:  1.  Patient will identify areas in their life where setting clear boundaries could be  used to improve their life.  2.  Patient will identify signs/triggers that a boundary is not being respected. 3.  Patient will identify two ways to set boundaries in order to achieve balance in  their lives: 4.  Patient will demonstrate ability to communicate their needs and set boundaries  through discussion and/or role plays  Summary of Patient Progress:  Did not attend  Therapeutic Modalities:   Cognitive Behavioral Therapy Solution-Focused Therapy  Beatris Si, LCSW 07/27/2022  2:09 PM
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
## 2022-07-28 DIAGNOSIS — F333 Major depressive disorder, recurrent, severe with psychotic symptoms: Secondary | ICD-10-CM | POA: Diagnosis not present

## 2022-07-28 LAB — CLOZAPINE (CLOZARIL)
Clozapine Lvl: 602 ng/mL — ABNORMAL HIGH (ref 350–600)
NorClozapine: 171 ng/mL
Total(Cloz+Norcloz): 773 ng/mL

## 2022-07-28 MED ORDER — CLOZAPINE 25 MG PO TABS
350.0000 mg | ORAL_TABLET | Freq: Every day | ORAL | Status: DC
  Administered 2022-07-28 – 2022-08-06 (×10): 350 mg via ORAL
  Filled 2022-07-28 (×12): qty 2

## 2022-07-28 MED ORDER — CLOZAPINE 25 MG PO TABS
375.0000 mg | ORAL_TABLET | Freq: Every day | ORAL | Status: DC
  Filled 2022-07-28: qty 3
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
## 2022-07-28 NOTE — Progress Notes (Signed)
Adult Psychoeducational Group Note  Date:  07/28/2022 Time:  9:33 PM  Group Topic/Focus:  Wrap-Up Group:   The focus of this group is to help patients review their daily goal of treatment and discuss progress on daily workbooks.  Participation Level:  Active  Participation Quality:  Appropriate  Affect:  Appropriate  Cognitive:  Appropriate  Insight: Appropriate  Engagement in Group:  Limited  Modes of Intervention:  Discussion  Additional Comments:  Pt stated her goal for today was to focus on her treatment plan. Pt stated she accomplished her goal today. Pt stated she talked with her doctor and with her social worker about her care today. Pt rated her overall day a 4 out of 10. Pt stated she made no calls today. Pt stated she felt better about herself tonight. Pt stated she was able to attend all meals today. Pt stated she took all medications provided today. Pt stated her appetite was fair today. Pt rated her sleep last night was fair. Pt stated the goal tonight was to get some rest. Pt stated she had no physical pain tonight. Pt deny visual hallucinations and auditory issues tonight. Pt denies thoughts of harming herself or others. Pt stated she would alert staff if anything changed.  Felipa Furnace 07/28/2022, 9:33 PM

## 2022-07-28 NOTE — Progress Notes (Signed)
   07/28/22 1100  Psych Admission Type (Psych Patients Only)  Admission Status Voluntary  Psychosocial Assessment  Patient Complaints None  Eye Contact Brief  Facial Expression Anxious  Affect Appropriate to circumstance  Speech Soft  Interaction Childlike  Motor Activity Slow  Appearance/Hygiene Unremarkable  Behavior Characteristics Appropriate to situation  Mood Pleasant  Thought Process  Coherency Circumstantial  Content Blaming others  Delusions Paranoid  Perception WDL  Hallucination None reported or observed  Judgment Limited  Confusion None  Danger to Self  Current suicidal ideation? Denies  Danger to Others  Danger to Others None reported or observed

## 2022-07-28 NOTE — Progress Notes (Signed)
   07/28/22 0500  Sleep  Number of Hours 6.75

## 2022-07-28 NOTE — Progress Notes (Signed)
   07/28/22 2115  Psych Admission Type (Psych Patients Only)  Admission Status Voluntary  Psychosocial Assessment  Patient Complaints None  Eye Contact Brief  Facial Expression Anxious  Affect Appropriate to circumstance  Speech Soft  Interaction Childlike;Cautious  Motor Activity Slow  Appearance/Hygiene Body odor  Behavior Characteristics Cooperative  Mood Pleasant;Preoccupied  Aggressive Behavior  Effect No apparent injury  Thought Process  Coherency Circumstantial  Content Blaming others  Delusions Paranoid  Perception WDL  Hallucination None reported or observed  Judgment Limited  Confusion None  Danger to Self  Current suicidal ideation? Denies  Danger to Others  Danger to Others None reported or observed

## 2022-07-28 NOTE — Progress Notes (Signed)
Adult Psychoeducational Group Note  Date:  07/28/2022 Time:  10:31 AM  Group Topic/Focus:  Goals Group:   The focus of this group is to help patients establish daily goals to achieve during treatment and discuss how the patient can incorporate goal setting into their daily lives to aide in recovery.  Participation Level:  Active  Participation Quality:  Appropriate  Affect:  Appropriate  Cognitive:  Appropriate  Insight: Appropriate  Engagement in Group:  Engaged  Modes of Intervention:  Discussion  Additional Comments:  Patient attended morning orientation/goal setting group and said that her goal for today is to work on her communication skills.   Vic Esco W Shalan Neault 07/28/2022, 10:31 AM

## 2022-07-29 ENCOUNTER — Encounter (HOSPITAL_COMMUNITY): Payer: Self-pay

## 2022-07-29 DIAGNOSIS — F251 Schizoaffective disorder, depressive type: Secondary | ICD-10-CM | POA: Diagnosis not present

## 2022-07-29 LAB — CLOZAPINE (CLOZARIL)
Clozapine Lvl: 288 ng/mL — ABNORMAL LOW (ref 350–600)
NorClozapine: 151 ng/mL
Total(Cloz+Norcloz): 439 ng/mL

## 2022-07-29 MED ORDER — FLUCONAZOLE 150 MG PO TABS
150.0000 mg | ORAL_TABLET | Freq: Every day | ORAL | Status: DC
  Administered 2022-07-29: 150 mg via ORAL
  Filled 2022-07-29 (×3): qty 1

## 2022-07-29 NOTE — BH IP Treatment Plan (Signed)
Interdisciplinary Treatment and Diagnostic Plan Update  07/29/2022 Time of Session: 10:00am  Cheryl Adams MRN: [id number]  Principal Diagnosis: Schizoaffective disorder, depressive type [facility])  Secondary Diagnoses: Principal Problem:   Schizoaffective disorder, depressive type (HCC) Active Problems:   Insomnia   GAD (generalized anxiety disorder)   Vitamin D deficiency   Current Medications:  Current Facility-Administered Medications  Medication Dose Route Frequency Provider Last Rate Last Admin   acetaminophen (TYLENOL) tablet 500 mg  500 mg Oral Q6H PRN Massengill, Harrold Donath, MD       amLODipine (NORVASC) tablet 10 mg  10 mg Oral Daily Massengill, Harrold Donath, MD   10 mg at 07/29/22 2229   antiseptic oral rinse (BIOTENE) solution 15 mL  15 mL Mouth Rinse 5 X Daily PRN Comer Locket, MD       atenolol (TENORMIN) tablet 25 mg  25 mg Oral Daily Massengill, Nathan, MD   25 mg at 07/29/22 0820   clonazePAM (KLONOPIN) disintegrating tablet 0.5 mg  0.5 mg Oral Daily Massengill, Nathan, MD   0.5 mg at 07/29/22 7989   cloZAPine (CLOZARIL) tablet 350 mg  350 mg Oral QHS Nkwenti, Tyler Aas, NP   350 mg at 07/28/22 2030   docusate sodium (COLACE) capsule 100 mg  100 mg Oral BID Massengill, Harrold Donath, MD   100 mg at 07/29/22 0820   ferrous sulfate tablet 325 mg  325 mg Oral Daily Princess Bruins, DO   325 mg at 07/29/22 0820   fluconazole (DIFLUCAN) tablet 150 mg  150 mg Oral Daily Comer Locket, MD       hydrOXYzine (ATARAX) tablet 25 mg  25 mg Oral TID PRN Phineas Inches, MD   25 mg at 06/17/22 2027   OLANZapine zydis (ZYPREXA) disintegrating tablet 5 mg  5 mg Oral Q8H PRN Massengill, Harrold Donath, MD   5 mg at 05/24/22 2154   And   LORazepam (ATIVAN) tablet 1 mg  1 mg Oral Q8H PRN Massengill, Harrold Donath, MD   1 mg at 07/03/22 2150   And   ziprasidone (GEODON) injection 20 mg  20 mg Intramuscular Q8H PRN Massengill, Harrold Donath, MD       lumateperone tosylate (CAPLYTA) capsule 42 mg  42 mg Oral Daily Massengill,  Harrold Donath, MD   42 mg at 07/28/22 0848   metFORMIN (GLUCOPHAGE-XR) 24 hr tablet 500 mg  500 mg Oral Q breakfast Massengill, Harrold Donath, MD   500 mg at 07/29/22 2119   methylphenidate (RITALIN) tablet 10 mg  10 mg Oral Daily Roselle Locus, MD   10 mg at 07/29/22 4174   polyethylene glycol (MIRALAX / GLYCOLAX) packet 17 g  17 g Oral Daily Massengill, Harrold Donath, MD   17 g at 07/29/22 0818   sertraline (ZOLOFT) tablet 100 mg  100 mg Oral Daily Massengill, Harrold Donath, MD   100 mg at 07/29/22 0820   vitamin D3 (CHOLECALCIFEROL) tablet 1,000 Units  1,000 Units Oral Daily Massengill, Harrold Donath, MD   1,000 Units at 07/29/22 0820   PTA Medications: Medications Prior to Admission  Medication Sig Dispense Refill Last Dose   ferrous sulfate 325 (65 FE) MG tablet Take 325 mg by mouth daily.      risperiDONE (RISPERDAL) 1 MG tablet Take 1 tablet (1 mg total) by mouth daily.      risperiDONE (RISPERDAL) 2 MG tablet Take 1 tablet (2 mg total) by mouth at bedtime.       Patient Stressors: Health problems   Marital or family conflict   Medication change  or noncompliance    Patient Strengths: Average or above average intelligence  Supportive family/friends   Treatment Modalities: Medication Management, Group therapy, Case management,  1 to 1 session with clinician, Psychoeducation, Recreational therapy.   Physician Treatment Plan for Primary Diagnosis: Schizoaffective disorder, depressive type (HCC) Long Term Goal(s): Improvement in symptoms so as ready for discharge   Short Term Goals: Ability to verbalize feelings will improve Ability to disclose and discuss suicidal ideas Ability to identify and develop effective coping behaviors will improve Compliance with prescribed medications will improve  Medication Management: Evaluate patient's response, side effects, and tolerance of medication regimen.  Therapeutic Interventions: 1 to 1 sessions, Unit Group sessions and Medication administration.  Evaluation of  Outcomes: Progressing  Physician Treatment Plan for Secondary Diagnosis: Principal Problem:   Schizoaffective disorder, depressive type (HCC) Active Problems:   Insomnia   GAD (generalized anxiety disorder)   Vitamin D deficiency  Long Term Goal(s): Improvement in symptoms so as ready for discharge   Short Term Goals: Ability to verbalize feelings will improve Ability to disclose and discuss suicidal ideas Ability to identify and develop effective coping behaviors will improve Compliance with prescribed medications will improve     Medication Management: Evaluate patient's response, side effects, and tolerance of medication regimen.  Therapeutic Interventions: 1 to 1 sessions, Unit Group sessions and Medication administration.  Evaluation of Outcomes: Progressing   RN Treatment Plan for Primary Diagnosis: Schizoaffective disorder, depressive type (HCC) Long Term Goal(s): Knowledge of disease and therapeutic regimen to maintain health will improve  Short Term Goals: Ability to remain free from injury will improve, Ability to participate in decision making will improve, Ability to verbalize feelings will improve, Ability to disclose and discuss suicidal ideas, and Ability to identify and develop effective coping behaviors will improve  Medication Management: RN will administer medications as ordered by provider, will assess and evaluate patient's response and provide education to patient for prescribed medication. RN will report any adverse and/or side effects to prescribing provider.  Therapeutic Interventions: 1 on 1 counseling sessions, Psychoeducation, Medication administration, Evaluate responses to treatment, Monitor vital signs and CBGs as ordered, Perform/monitor CIWA, COWS, AIMS and Fall Risk screenings as ordered, Perform wound care treatments as ordered.  Evaluation of Outcomes: Progressing   LCSW Treatment Plan for Primary Diagnosis: Schizoaffective disorder, depressive  type (HCC) Long Term Goal(s): Safe transition to appropriate next level of care at discharge, Engage patient in therapeutic group addressing interpersonal concerns.  Short Term Goals: Engage patient in aftercare planning with referrals and resources, Increase social support, Increase emotional regulation, Facilitate acceptance of mental health diagnosis and concerns, Identify triggers associated with mental health/substance abuse issues, and Increase skills for wellness and recovery  Therapeutic Interventions: Assess for all discharge needs, 1 to 1 time with Social worker, Explore available resources and support systems, Assess for adequacy in community support network, Educate family and significant other(s) on suicide prevention, Complete Psychosocial Assessment, Interpersonal group therapy.  Evaluation of Outcomes: Progressing   Progress in Treatment: Attending groups: Yes. Participating in groups: Yes. Taking medication as prescribed: Yes. Toleration medication: Yes. Family/Significant other contact made: Yes, individual(s) contacted:  Sherlean Foot, mother Patient understands diagnosis: Yes. Discussing patient identified problems/goals with staff: Yes. Medical problems stabilized or resolved: Yes. Denies suicidal/homicidal ideation: No. Issues/concerns per patient self-inventory: Yes. Other: none   New problem(s) identified: No, Describe:  none   New Short Term/Long Term Goal(s):Patient to work towards elimination of symptoms of psychosis, medication management for mood stabilization; development  of comprehensive mental wellness plan.   Patient Goals:  No additional goals identified at this time. Patient to continue to work towards original goals identified in initial treatment team meeting. CSW will remain available to patient should they voice additional treatment goals.    Discharge Plan or Barriers: Patient continues to be paranoid of others in her home. Patient may discharge  with shelter resources.    Reason for Continuation of Hospitalization: Other; describe psychosis    Estimated Length of Stay:     Last 3 [place] Suicide Severity Risk Score: [place] Admission (Current) from 05/19/2022 in [place] [facility] ED from 05/18/2022 in [facility] Office Visit from 04/14/2022 in [place] No Risk High Risk No Risk       Last PHQ 2/9 Scores:    04/14/2022   10:46 AM 02/24/2022   11:12 AM 01/13/2022    9:14 AM  Depression screen PHQ 2/9  Decreased Interest 0 0 0  Down, Depressed, Hopeless 0 0 0  PHQ - 2 Score 0 0 0    Scribe for Treatment Team: Darleen Crocker, Latanya Presser 07/29/2022 9:21 AM

## 2022-07-29 NOTE — Progress Notes (Addendum)
Writer noticed a foul odor coming from the patient so Clinical research associate asked patient when they last showered. Patient replied, "1 day ago", so Clinical research associate asked patient if they would like Clinical research associate to assist them with a shower before bed in which patient responded, "yes". Writer witnessed patient wash vaginal area but still smelled a faint odor. Writer witnessed patient drying off with the towel, wiping vigorously in the vaginal area. Writer asked patient if the vaginal area hurt or itched and patient replied, "it itches". Writer asked if it itched in the inside or the outside of the vagina and they replied "the outside". Writer asked how long it has itched and they replied, "for a while". Writer encouraged patient to relay to the Dr about their vagina itching. RN notified.

## 2022-07-29 NOTE — Group Note (Signed)
  [facility] LCSW Group Therapy Note  Date/Time:  07/29/2022 10:00AM-11:00AM  Type of Therapy and Topic:  Group Therapy:  Self-Care Wheel  Participation Level:  Active   Description of Group This process group involved patients discussing the importance of self-care in different areas of life (professional, personal, emotional, psychological, spiritual, and physical) in order to achieve healthy life balance.  The group talked about what self-care in each of those areas would constitute and then specifically listed how they want to provide themselves with improved self-care.  Therapeutic Goals Patient will learn how to break self-care down into various areas of life Patient will participate in generating ideas about healthy self-care options in each category Patients will be supportive of one another and receive support from others Patient will identify one healthy self-care activity to add to his/her life   Summary of Patient Progress:  The patient expressed that one thing she can share about herself is that she enjoyed going to shopping malls. Because others in group expressed similarities, this helped him/her to see the other group members ideas  During the discussion about self-care, she shared insight. She was able to identify ways in which self-care in different areas would be helpful.  Patient's insight was appropriate.  Therapeutic Modalities Processing Psychoeducation   Hadleigh Felber S. Stepheny Canal, LCSW 07/29/2022 2:05 PM

## 2022-07-29 NOTE — Progress Notes (Signed)
   07/29/22 0500  Sleep  Number of Hours 7

## 2022-07-29 NOTE — Group Note (Signed)
Recreation Therapy Group Note   Group Topic:Team Building  Group Date: 07/29/2022 Start Time: 1000 End Time: 1045 Facilitators: Caroll Rancher, LRT,CTRS Location: 500 [address] Dayroom   Goal Area(s) Addresses:  Patient will effectively work with peer towards shared goal.  Patient will identify skills used to make activity successful.  Patient will identify how skills used during activity can be used to reach post d/c goals.   Group Description: Landing Pad. In teams of 3-5, patients were given 12 plastic drinking straws and an equal length of masking tape. Using the materials provided, patients were asked to build a landing pad to catch a golf ball dropped from approximately 5 feet in the air. All materials were required to be used by the team in their design. LRT facilitated post-activity discussion.   Affect/Mood: Appropriate   Participation Level: Active   Participation Quality: Independent   Behavior: Attentive    Speech/Thought Process: Focused   Insight: Moderate   Judgement: Moderate   Modes of Intervention: STEM Activity   Patient Response to Interventions:  Attentive   Education Outcome:  Acknowledges education and In group clarification offered    Clinical Observations/Individualized Feedback: Pt was quiet and attentive during activity.  Pt watched as peer was working on the activity.  Pt didn't offer any suggestions but was attentive to peer as she worked.  Pt was appropriate during session.    Plan: Continue to engage patient in RT group sessions 2-3x/week.   Caroll Rancher, LRT,CTRS 07/29/2022 1:34 PM

## 2022-07-29 NOTE — Progress Notes (Signed)
   07/29/22 2015  Psych Admission Type (Psych Patients Only)  Admission Status Voluntary  Psychosocial Assessment  Patient Complaints None  Eye Contact Brief  Facial Expression Anxious  Affect Appropriate to circumstance  Speech Soft  Interaction Childlike;Cautious  Motor Activity Slow  Appearance/Hygiene Body odor  Behavior Characteristics Cooperative  Mood Preoccupied  Aggressive Behavior  Effect No apparent injury  Thought Process  Coherency Circumstantial  Content Blaming others  Delusions Paranoid  Perception WDL  Hallucination None reported or observed  Judgment Limited  Confusion None  Danger to Self  Current suicidal ideation? Denies  Danger to Others  Danger to Others None reported or observed
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
## 2022-07-29 NOTE — Progress Notes (Signed)
Adult Psychoeducational Group Note  Date:  07/29/2022 Time:  8:47 PM  Group Topic/Focus:  Wrap-Up Group:   The focus of this group is to help patients review their daily goal of treatment and discuss progress on daily workbooks.  Participation Level:  Active  Participation Quality:  Appropriate and Attentive  Affect:  Appropriate  Cognitive:  Appropriate  Insight: Appropriate  Engagement in Group:  Engaged  Modes of Intervention:  Discussion  Additional Comments:   Pt attended and participated in the Wrap-Up group. Pt day/mood 4/10. Pt reports progress made toward improving self-care. Pt shared that the self-care groups have helped her learn how to better care for herself. Pt identified practicing better self-care as coping skills.  Edmund Hilda Raniah Karan 07/29/2022, 8:47 PM

## 2022-07-30 DIAGNOSIS — F251 Schizoaffective disorder, depressive type: Secondary | ICD-10-CM | POA: Diagnosis not present

## 2022-07-30 NOTE — Progress Notes (Signed)
   07/30/22 2130  Psych Admission Type (Psych Patients Only)  Admission Status Voluntary  Psychosocial Assessment  Patient Complaints None  Eye Contact Brief  Facial Expression Anxious  Affect Appropriate to circumstance  Speech Soft  Interaction Childlike;Cautious  Motor Activity Slow  Appearance/Hygiene Body odor  Behavior Characteristics Cooperative  Mood Suspicious;Preoccupied  Aggressive Behavior  Effect No apparent injury  Thought Process  Coherency Circumstantial  Content Blaming others  Delusions Paranoid  Perception WDL  Hallucination None reported or observed  Judgment Limited  Confusion None  Danger to Self  Current suicidal ideation? Denies  Danger to Others  Danger to Others None reported or observed

## 2022-07-30 NOTE — [facility] Group Notes (Signed)
Adult Psychoeducational Group Note  Date:  07/30/2022 Time:  8:59 PM  Group Topic/Focus:  Wrap-Up Group:   The focus of this group is to help patients review their daily goal of treatment and discuss progress on daily workbooks.  Participation Level:  Active  Participation Quality:  Appropriate and Attentive  Affect:  Appropriate  Cognitive:  Alert and Appropriate  Insight: Appropriate and Good  Engagement in Group:  Engaged  Modes of Intervention:  Discussion and Education  Additional Comments:  Pt attended and participated in wrap up group this evening and rated their day a 4/10. Pt states that they went to two groups and communicated in each group. Pt completed their goal, which was to meditate and communicate more. Pt has no concerns to report at this time.   Chrisandra Netters 07/30/2022, 8:59 PM

## 2022-07-30 NOTE — [facility] Group Notes (Signed)
Adult Psychoeducational Group Note  Date:  07/30/2022 Time:  8:54 AM  Group Topic/Focus:  Goals Group:   The focus of this group is to help patients establish daily goals to achieve during treatment and discuss how the patient can incorporate goal setting into their daily lives to aide in recovery.  Participation Level:  Did Not Attend  Donell Beers 07/30/2022, 8:54 AM
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
## 2022-07-30 NOTE — Plan of Care (Signed)
  Problem: Education: Goal: Knowledge of [facility] Education information/materials will improve Outcome: Not Progressing Goal: Emotional status will improve Outcome: Progressing Goal: Mental status will improve Outcome: Not Progressing Goal: Verbalization of understanding the information provided will improve Outcome: Not Progressing   Problem: Activity: Goal: Interest or engagement in activities will improve Outcome: Not Progressing   Problem: Coping: Goal: Ability to verbalize frustrations and anger appropriately will improve Outcome: Not Progressing

## 2022-07-30 NOTE — Progress Notes (Signed)
Patient alert and verbal today, smiling and talking to staff. She took her medications appropriately, denies SI/HI/AVH, rates her depression, hopelessness and anxiety 4/10. Pt denies having any pain at present. Pt can be seen talking to herself at times and mumbling. Encouraged pt to shower and complete hygiene. Will continue q20min checks and continue to monitor.

## 2022-07-30 NOTE — Progress Notes (Signed)
Patient has been pleasant most of the day, smiling and talking to staff on the unit.

## 2022-07-30 NOTE — Group Note (Signed)
Occupational Therapy Group Note  Group Topic:Coping Skills  Group Date: 07/30/2022 Start Time: 1400 End Time: 1455 Facilitators: Ted Mcalpine, OT   Group Description: Group encouraged increased engagement and participation through discussion and activity focused on "Coping Ahead." Patients were split up into teams and selected a card from a stack of positive coping strategies. Patients were instructed to act out/charade the coping skill for other peers to guess and receive points for their team. Discussion followed with a focus on identifying additional positive coping strategies and patients shared how they were going to cope ahead over the weekend while continuing hospitalization stay.  Therapeutic Goal(s): Identify positive vs negative coping strategies. Identify coping skills to be used during hospitalization vs coping skills outside of hospital/at home Increase participation in therapeutic group environment and promote engagement in treatment   Participation Level: Active and Engaged   Participation Quality: Independent   Behavior: Appropriate, Calm, and Cooperative   Speech/Thought Process: Coherent and Relevant   Affect/Mood: Appropriate   Insight: Fair   Judgement: Fair   Individualization: pt was active / engaged in their participation of group discussion/activity. New skills were identified  Modes of Intervention: Discussion and Education  Patient Response to Interventions:  Attentive, Engaged, Interested , and Receptive   Plan: Continue to engage patient in OT groups 2 - 3x/week.  07/30/2022  Ted Mcalpine, OT Kerrin Champagne, OT

## 2022-07-30 NOTE — Progress Notes (Signed)
   07/30/22 0515  Sleep  Number of Hours 8    

## 2022-07-30 NOTE — BHH Counselor (Signed)
CSW spoke with Mr. Senaida Ores (Court appointed Guardian Ad Litem) who states that he will be coming to meet with the Pt on 07/31/2022 at 9:45am.  Phone number is [phone] (Office)

## 2022-07-31 ENCOUNTER — Encounter (HOSPITAL_COMMUNITY): Payer: Self-pay | Admitting: Psychiatry

## 2022-07-31 DIAGNOSIS — F251 Schizoaffective disorder, depressive type: Secondary | ICD-10-CM | POA: Diagnosis not present

## 2022-07-31 NOTE — Group Note (Signed)
LCSW Group Therapy  Type of Therapy and Topic:  Group Therapy: Thoughts, Feelings, and Actions  Participation Level:  Minimal   Description of Group:   In this group, each patient discussed their previous experiencing and understanding of overthinking, identifying the harmful impact on their lives. As a group, each patient was introduced to the basic concepts of Cognitive Behavioral Therapy: that thoughts, feelings, and actions are all connected and influence one another. They were given examples of how overthinking can affect our feelings, actions, and vise versa. The group was then asked to analyze how overthinking was harmful and brainstorm alternative thinking patterns/reactions to the example situation. Then, each group member filled out and identified their own example situation in which a problem situation caused their thoughts, feelings, and actions to be negatively impacted; they were asked to come up with 3 new (more adaptive/positive) thoughts that led to 3 new feelings and actions.  Therapeutic Goals: Patients will review and discuss their past experience with overthinking. Patients will learn the basics of the CBT model through group-led examples.. Patients will identify situations where they may have negative thoughts, feelings, or actions and will then reframe the situation using more positive thoughts to react differently.  Summary of Patient Progress:  Patient contributed to the discussion of how thoughts, feelings, and actions interact, noting when they may have experienced a negative thought pattern and recognized it as harmful. They were attentive when other patients shared their experiences, and worked to reframe their own thoughts in an activity to identify future situations where they may typically overthink.  Therapeutic Modalities:   Cognitive Behavioral Therapy Mindfulness  Azaya Goedde E Mariene Dickerman, LCSW 07/31/2022  11:56 AM    

## 2022-07-31 NOTE — Group Note (Signed)
Recreation Therapy Group Note   Group Topic:Self-Esteem  Group Date: 07/31/2022 Start Time: 1015 End Time: 1050 Facilitators: Caroll Rancher, LRT,CTRS Location: [address] Dayroom   Goal Area(s) Addresses:  Patient will successfully identify positive attributes about themselves.  Patient will identify healthy ways to increase self-esteem. Patient will acknowledge benefit(s) of improved self-esteem.   Group Description:  Self Retrospective.  Patients were to envision talking to their younger selves. Patients were to then express, based on the things they had learned thus far in life, what advice they would give their younger selves to keep them from making some of the decisions they have made as an adult.   Affect/Mood: N/A   Participation Level: Did not attend    Clinical Observations/Individualized Feedback:     Plan: Continue to engage patient in RT group sessions 2-3x/week.   Caroll Rancher, Antonietta Jewel  07/31/2022 12:57 PM

## 2022-07-31 NOTE — B[facility] Group Notes (Signed)
B[facility] Group Notes:  (Nursing/MHT/Case Management/Adjunct)  Date:  07/31/2022  Time:  10:57 AM  Type of Therapy:   Orientation/Goals group  Participation Level:  Minimal  Participation Quality:  Appropriate  Affect:  Flat  Cognitive:  Disorganized and Confused  Insight:  Improving and Lacking  Engagement in Group:  Engaged and Improving  Modes of Intervention:  Discussion, Education, Orientation, and Support  Summary of Progress/Problems: Pt goal for today take a shower and find out when she can go home.   Gibran Veselka J Lindy Pennisi 07/31/2022, 10:57 AM

## 2022-07-31 NOTE — Progress Notes (Signed)
   07/31/22 2100  Psych Admission Type (Psych Patients Only)  Admission Status Voluntary  Psychosocial Assessment  Patient Complaints None  Eye Contact Brief  Facial Expression Anxious  Affect Appropriate to circumstance  Speech Soft  Interaction Childlike;Cautious  Motor Activity Slow  Appearance/Hygiene Body odor  Behavior Characteristics Cooperative  Mood Preoccupied  Aggressive Behavior  Effect No apparent injury  Thought Process  Coherency Circumstantial  Content Blaming others  Delusions Paranoid  Perception WDL  Hallucination None reported or observed  Judgment Limited  Confusion None  Danger to Self  Current suicidal ideation? Denies  Danger to Others  Danger to Others None reported or observed

## 2022-07-31 NOTE — Progress Notes (Signed)
Pt alert and oriented x4 today. She is in a pleasant mood and has been attending groups. She took a shower after a moderate amount of encouragement this morning. She denies any SI/HI/AVH. She took her medications as ordered. Rates her anxiety and depression a 4/10. Denies any pain today. Will continue q68min checks and provide verbal redirection as needed.

## 2022-07-31 NOTE — Progress Notes (Signed)
Adult Psychoeducational Group Note  Date:  07/31/2022 Time:  9:18 PM  Group Topic/Focus:  Wrap-Up Group:   The focus of this group is to help patients review their daily goal of treatment and discuss progress on daily workbooks.  Participation Level:  Active  Participation Quality:  Appropriate and Attentive  Affect:  Appropriate  Cognitive:  Alert and Appropriate  Insight: Appropriate  Engagement in Group:  Engaged  Modes of Intervention:  Discussion  Additional Comments:   Pt attended and actively participated in the Wrap-Up group.Pt denied any SI/HI or thoughts of self-harm. Pt day/mood 4/10, reports experiencing some anxiety earlier today in which she used meditation to help her calm. Pt was able to make some progress toward increasing communication and socialization in groups.   Edmund Hilda Durinda Buzzelli 07/31/2022, 9:18 PM

## 2022-07-31 NOTE — BHH Counselor (Signed)
CSW and the Pt met with the [name] Guardian Ad Litem, Mr. Marvel Plan on 07/31/2022 at 9;45am.  The Pt states that she does not trust her family because they "stare at me and are passive aggressive and send me subliminal messages".  She states "they say one thing but mean something different".  When asked by Mr. Marvel Plan which family members she is afraid of the Pt stated "just my family in general".  The Pt was unable to identify which family members she is afraid of or which family members came to the family meeting a couple weeks ago.  The Pt stated that her family often tells her to do things like shower and clean and that they are "taking control of me". 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  The Guardian Ad Litem explained the court proceedings to the Pt and the Pt was allowed to ask questions.  The full hearing is scheduled for 09/29/2022 and the interim hearing in for 08/04/2022.  The Pt states that she would like a nurse at the [facility] hospital to be her Guardian.  Mr. Marvel Plan states that he will be recommending that the state DSS become the legal Guardian.  Mr. Marvel Plan states that prior to 09/29/2022 a multi-disciplinary team from [facility] will be meeting with the Pt to complete additional assessments.  CSW will update the providers as additional information is received.

## 2022-07-31 NOTE — BHH Group Notes (Signed)
Spirituality group facilitated by Kathleen Argue, BCC.   Group Description: Group focused on topic of hope. Patients participated in facilitated discussion around topic, connecting with one another around experiences and definitions for hope. Group members engaged with visual explorer photos, reflecting on what hope looks like for them today. Group engaged in discussion around how their definitions of hope are present today in hospital.   Modalities: Psycho-social ed, Adlerian, Narrative, MI   Patient Progress: Tesneem attended group and actively engaged and participated in the group conversation.  [address], Bcc Pager, [phone]
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
## 2022-07-31 NOTE — Progress Notes (Signed)
Patient took a shower today with moderate amount of redirection and encouragement.

## 2022-07-31 NOTE — Progress Notes (Signed)
   07/31/22 0500  Sleep  Number of Hours 7.75    

## 2022-08-01 DIAGNOSIS — F333 Major depressive disorder, recurrent, severe with psychotic symptoms: Secondary | ICD-10-CM | POA: Diagnosis not present

## 2022-08-01 NOTE — Group Note (Signed)
LCSW Group Therapy Note 08/01/2022  11:00am - 12:00pm  Type of Therapy and Topic:  Group Therapy: Anger and Powerlessness  Participation Level:  Active   Description of Group: In this group, patients identified a frequent cause of their anger and a frequent reaction.  They analyzed how their reaction was possibly beneficial and how it was possibly unhelpful.  Their commonalities with other patients was pointed out and discussed.  It was also discussed that feelings of powerlessness are often present in the situations described, and that they have this feeling in common with other people as well.  The bulk of group time was spent talking about how they can take back their power in small but significant ways that feels empowering.  Therapeutic Goals: Patients will describe a frequent cause of anger, plus other emotions that are felt along with the anger Patients will identify their typical manner of reacting to this anger Patients will be able to acknowledge the common theme of powerlessness among the group members Patients will consider ways to take back their power where and when they are able  Summary of Patient Progress:  The patient shared that one of her frequent causes of anger is people being rude or lacking manners.  Her typical reaction is to pay no attention to the person.  CSW suggested that one means of taking power back in this situation is to control how much she herself shows manners as an example to the rude people.    Therapeutic Modalities:   Processing Brief Solution-Focused Therapy  Lynnell Chad, LCSW 08/01/2022  1:35 PM

## 2022-08-01 NOTE — Progress Notes (Signed)
   08/01/22 0515  Sleep  Number of Hours 7.75

## 2022-08-01 NOTE — BHH Group Notes (Signed)
Goals Group 08/01/2022   Group Focus: affirmation, clarity of thought, and goals/reality orientation Treatment Modality:  Psychoeducation Interventions utilized were assignment, group exercise, and support Purpose: To be able to understand and verbalize the reason for their admission to the hospital. To understand that the medication helps with their chemical imbalance but they also need to work on their choices in life. To be challenged to develop a list of 30 positives about themselves. Also introduce the concept that "feelings" are not reality.  Participation Level:  Active  Participation Quality:  Appropriate  Affect:  Appropriate  Cognitive:  Appropriate  Insight:  Improving  Engagement in Group:  Engaged  Additional Comments:  Pt attended the group. Rates her energy at a 4/10. Was a bit more interactive today. Speaking a bit louder with better eye contact.  Dione Housekeeper
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
## 2022-08-01 NOTE — Progress Notes (Signed)
   08/01/22 2115  Psych Admission Type (Psych Patients Only)  Admission Status Voluntary  Psychosocial Assessment  Patient Complaints None  Eye Contact Brief  Facial Expression Flat  Affect Appropriate to circumstance;Blunted  Speech Soft  Interaction Childlike;No initiation  Motor Activity Slow  Appearance/Hygiene Improved  Behavior Characteristics Cooperative;Appropriate to situation  Mood Pleasant  Thought Process  Coherency Circumstantial  Content Blaming others  Delusions Paranoid  Perception WDL  Hallucination None reported or observed  Judgment Limited  Confusion None  Danger to Self  Current suicidal ideation? Denies

## 2022-08-01 NOTE — Progress Notes (Signed)
Adult Psychoeducational Group Note  Date:  08/01/2022 Time:  8:43 PM  Group Topic/Focus:  Wrap-Up Group:   The focus of this group is to help patients review their daily goal of treatment and discuss progress on daily workbooks.  Participation Level:  Active  Participation Quality:  Appropriate  Affect:  Appropriate  Cognitive:  Appropriate  Insight: Appropriate  Engagement in Group:  Developing/Improving  Modes of Intervention:  Discussion  Additional Comments:   Pt stated her goal for today was to focus on her treatment plan. Pt stated she accomplished her goal today. Pt stated she did not talked with her doctor or with her social worker about her care today. Pt rated her overall day a 4 out of 10. Pt stated she really enjoyed going outside with her peers today. Pt stated she made no calls today. Pt stated she felt better about herself tonight. Pt stated she was able to attend all meals today. Pt stated she took all medications provided today. Pt stated her appetite was fair today. Pt rated her sleep last night was fair. Pt stated the goal tonight was to get some rest. Pt stated she had no physical pain tonight. Pt deny visual hallucinations and auditory issues tonight. Pt denies thoughts of harming herself or others. Pt stated she would alert staff if anything changed.  Cheryl Adams 08/01/2022, 8:43 PM

## 2022-08-02 DIAGNOSIS — F333 Major depressive disorder, recurrent, severe with psychotic symptoms: Secondary | ICD-10-CM

## 2022-08-02 NOTE — Plan of Care (Signed)
  Problem: Coping: Goal: Ability to verbalize frustrations and anger appropriately will improve Outcome: Progressing Goal: Ability to demonstrate self-control will improve Outcome: Progressing   Problem: Activity: Goal: Interest or engagement in activities will improve Outcome: Progressing Goal: Sleeping patterns will improve Outcome: Progressing   Problem: Safety: Goal: Ability to redirect hostility and anger into socially appropriate behaviors will improve Outcome: Progressing Goal: Ability to remain free from injury will improve Outcome: Progressing

## 2022-08-02 NOTE — Progress Notes (Signed)
Adult Psychoeducational Group Note  Date:  08/02/2022 Time:  9:15 PM  Group Topic/Focus:  Wrap-Up Group:   The focus of this group is to help patients review their daily goal of treatment and discuss progress on daily workbooks.  Participation Level:  Active  Participation Quality:  Appropriate  Affect:  Appropriate  Cognitive:  Appropriate  Insight: Appropriate  Engagement in Group:  Engaged  Modes of Intervention:  Discussion  Additional Comments:   Pt attended and actively participated in the Wrap-Up group. Pt denied SI/HI and thoughts of self-harm. Pt day/mood 4/10. Pt reports anxiety in which she practice meditation to improve coping. Pt states she journals sometimes, walks and practice deep breathing to cope daily.  Cheryl Adams 08/02/2022, 9:15 PM
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
## 2022-08-02 NOTE — Group Note (Signed)
[facility] LCSW Group Therapy Note  Date/Time:  08/02/2022  11:00AM-12:00PM  Type of Therapy and Topic:  Group Therapy:  Music and Mood  Participation Level:  Active   Description of Group: In this process group, members listened to a variety of genres of music and identified that different types of music evoke different responses.  Patients were encouraged to identify music that was soothing for them and music that was energizing for them.  Patients discussed how this knowledge can help with wellness and recovery in various ways including managing depression and anxiety as well as encouraging healthy sleep habits.    Therapeutic Goals: Patients will explore the impact of different varieties of music on mood Patients will verbalize the thoughts they have when listening to different types of music Patients will identify music that is soothing to them as well as music that is energizing to them Patients will discuss how to use this knowledge to assist in maintaining wellness and recovery Patients will explore the use of music as a coping skill  Summary of Patient Progress:  At the beginning of group, patient expressed her mood was good.  At the end of group, patient expressed her mood was uplifted.  She stated that after discharge music can be a part of her routine to make it through the day.    Therapeutic Modalities: Solution Focused Brief Therapy Activity   Ambrose Mantle, LCSW

## 2022-08-02 NOTE — Progress Notes (Signed)
   08/02/22 2238  Psych Admission Type (Psych Patients Only)  Admission Status Voluntary  Psychosocial Assessment  Patient Complaints None  Eye Contact Brief  Facial Expression Flat  Affect Appropriate to circumstance;Blunted  Speech Soft  Interaction Childlike;No initiation  Motor Activity Slow  Appearance/Hygiene Improved  Behavior Characteristics Appropriate to situation;Cooperative  Mood Pleasant  Thought Process  Coherency Circumstantial  Content Blaming others  Delusions Paranoid  Perception WDL  Hallucination None reported or observed  Judgment Limited  Confusion None  Danger to Self  Current suicidal ideation? Denies

## 2022-08-02 NOTE — Progress Notes (Signed)
   08/02/22 0800  Psych Admission Type (Psych Patients Only)  Admission Status Voluntary  Psychosocial Assessment  Patient Complaints None  Eye Contact Brief  Facial Expression Flat  Affect Appropriate to circumstance  Speech Soft  Interaction Childlike;No initiation  Motor Activity Slow  Appearance/Hygiene Improved  Behavior Characteristics Cooperative;Appropriate to situation  Mood Pleasant  Thought Process  Coherency Circumstantial  Content Blaming others  Delusions Paranoid  Perception WDL  Hallucination None reported or observed  Judgment WDL  Confusion None  Danger to Self  Current suicidal ideation? Denies  Self-Injurious Behavior No self-injurious ideation or behavior indicators observed or expressed   Agreement Not to Harm Self Yes  Description of Agreement verbal contract  Danger to Others  Danger to Others None reported or observed

## 2022-08-02 NOTE — [facility] Group Notes (Signed)
Adult Psychoeducational Group Note Date:  08/01/2022 Time:  0900-1045 Group Topic/Focus: PROGRESSIVE RELAXATION. A group where deep breathing is taught and tensing and relaxation muscle groups is used. Imagery is used as well.  Pts are asked to imagine 3 pillars that hold them up when they are not able to hold themselves up and to share that with the group.  Participation Level:  Active  Participation Quality:  Appropriate  Affect:  Appropriate  Cognitive:  Oriented  Insight: Improving  Engagement in Group:  Engaged  Modes of Intervention:  Activity, Discussion, Education, and Support  Additional Comments:  Rates her energy at a 4/10. States faith, music and a quiet environment.  Dione Housekeeper

## 2022-08-03 ENCOUNTER — Encounter (HOSPITAL_COMMUNITY): Payer: Self-pay

## 2022-08-03 DIAGNOSIS — F333 Major depressive disorder, recurrent, severe with psychotic symptoms: Secondary | ICD-10-CM | POA: Diagnosis not present

## 2022-08-03 LAB — CBC WITH DIFFERENTIAL/PLATELET
Abs Immature Granulocytes: 0.06 10*3/uL (ref 0.00–0.07)
Basophils Absolute: 0 10*3/uL (ref 0.0–0.1)
Basophils Relative: 1 %
Eosinophils Absolute: 0.2 10*3/uL (ref 0.0–0.5)
Eosinophils Relative: 4 %
HCT: 39.6 % (ref 36.0–46.0)
Hemoglobin: 12.3 g/dL (ref 12.0–15.0)
Immature Granulocytes: 1 %
Lymphocytes Relative: 27 %
Lymphs Abs: 1.6 10*3/uL (ref 0.7–4.0)
MCH: 26 pg (ref 26.0–34.0)
MCHC: 31.1 g/dL (ref 30.0–36.0)
MCV: 83.7 fL (ref 80.0–100.0)
Monocytes Absolute: 0.3 10*3/uL (ref 0.1–1.0)
Monocytes Relative: 6 %
Neutro Abs: 3.6 10*3/uL (ref 1.7–7.7)
Neutrophils Relative %: 61 %
Platelets: 236 10*3/uL (ref 150–400)
RBC: 4.73 MIL/uL (ref 3.87–5.11)
RDW: 13.8 % (ref 11.5–15.5)
WBC: 5.9 10*3/uL (ref 4.0–10.5)
nRBC: 0 % (ref 0.0–0.2)

## 2022-08-03 NOTE — Group Note (Signed)
Recreation Therapy Group Note   Group Topic:Self-Esteem  Group Date: 08/03/2022 Start Time: 1000 End Time: 1030 Facilitators: Caroll Rancher, LRT,CTRS Location: [address] Dayroom   Goal Area(s) Addresses:  Patient will identify and write at least one positive trait about themself. Patient will acknowledge the benefit of healthy self-esteem. Patient will endorse understanding of ways to increase self-esteem.   Group Description:   LRT began group session with open dialogue asking the patients to define self-esteem and verbally identify positive qualities and traits people may possess. Patients were then instructed to design a personalized license plate, with words and drawings, representing at least 3 positive things about themselves. Pts were encouraged to include favorites, things they are proud of, what they enjoy doing, and dreams for their future. If a patient had a life motto or a meaningful phase that expressed their life values, pt's were asked to incorporate that into their design as well. Patients were given the opportunity to share their completed work with the group.   Affect/Mood: Appropriate   Participation Level: Engaged   Participation Quality: Independent   Behavior: Appropriate   Speech/Thought Process: Focused   Insight: Good   Judgement: Good   Modes of Intervention: Art   Patient Response to Interventions:  Engaged   Education Outcome:  Acknowledges education and In group clarification offered    Clinical Observations/Individualized Feedback: Pt was quiet but attentive to the project.  Pt described herself as having unique ideas and being resourceful.  Pt also expressed liking pizza, early 2000s r&b music and being good at bowling.    Plan: Continue to engage patient in RT group sessions 2-3x/week.   Caroll Rancher, LRT,CTRS 08/03/2022 11:45 AM
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
## 2022-08-03 NOTE — Progress Notes (Signed)
Adult Psychoeducational Group Note  Date:  08/03/2022 Time:  8:58 PM  Group Topic/Focus:  Wrap-Up Group:   The focus of this group is to help patients review their daily goal of treatment and discuss progress on daily workbooks.  Participation Level:  Active  Participation Quality:  Appropriate  Affect:  Appropriate  Cognitive:  Appropriate  Insight: Appropriate  Engagement in Group:  Improving  Modes of Intervention:  Discussion  Additional Comments:   Pt stated her goal for today was to focus on her treatment plan. Pt stated she accomplished her goal today. Pt stated she talked with her doctor and with her social worker about her care today. Pt rated her overall day a 4 out of 10.  Pt stated she made no calls today. Pt stated she felt better about herself tonight. Pt stated she was able to attend all meals today. Pt stated she took all medications provided today. Pt stated her appetite was fair today. Pt rated her sleep last night was fair. Pt stated the goal tonight was to get some rest. Pt stated she had no physical pain tonight. Pt deny visual hallucinations and auditory issues tonight. Pt denies thoughts of harming herself or others. Pt stated she would alert staff if anything changed.  Cheryl Adams 08/03/2022, 8:58 PM

## 2022-08-03 NOTE — BH IP Treatment Plan (Signed)
Interdisciplinary Treatment and Diagnostic Plan Update  08/03/2022 Time of Session: update Cheryl Adams MRN: [id number]  Principal Diagnosis: Schizoaffective disorder, depressive type [facility])  Secondary Diagnoses: Principal Problem:   Schizoaffective disorder, depressive type (HCC) Active Problems:   Insomnia   GAD (generalized anxiety disorder)   Psychosis (HCC)   Vitamin D deficiency   MDD (major depressive disorder), recurrent, severe, with psychosis (HCC)   Current Medications:  Current Facility-Administered Medications  Medication Dose Route Frequency Provider Last Rate Last Admin   acetaminophen (TYLENOL) tablet 500 mg  500 mg Oral Q6H PRN Massengill, Nathan, MD       amLODipine (NORVASC) tablet 10 mg  10 mg Oral Daily Massengill, Nathan, MD   10 mg at 08/03/22 0803   antiseptic oral rinse (BIOTENE) solution 15 mL  15 mL Mouth Rinse 5 X Daily PRN Comer Locket, MD       atenolol (TENORMIN) tablet 25 mg  25 mg Oral Daily Massengill, Nathan, MD   25 mg at 08/03/22 0805   clonazePAM (KLONOPIN) disintegrating tablet 0.5 mg  0.5 mg Oral Daily Massengill, Nathan, MD   0.5 mg at 08/03/22 0803   cloZAPine (CLOZARIL) tablet 350 mg  350 mg Oral QHS Nkwenti, Doris, NP   350 mg at 08/02/22 2043   docusate sodium (COLACE) capsule 100 mg  100 mg Oral BID Massengill, Harrold Donath, MD   100 mg at 08/03/22 0804   ferrous sulfate tablet 325 mg  325 mg Oral Daily Princess Bruins, DO   325 mg at 08/03/22 0254   hydrOXYzine (ATARAX) tablet 25 mg  25 mg Oral TID PRN Phineas Inches, MD   25 mg at 06/17/22 2027   OLANZapine zydis (ZYPREXA) disintegrating tablet 5 mg  5 mg Oral Q8H PRN Massengill, Harrold Donath, MD   5 mg at 05/24/22 2154   And   LORazepam (ATIVAN) tablet 1 mg  1 mg Oral Q8H PRN Massengill, Harrold Donath, MD   1 mg at 07/03/22 2150   And   ziprasidone (GEODON) injection 20 mg  20 mg Intramuscular Q8H PRN Massengill, Harrold Donath, MD       lumateperone tosylate (CAPLYTA) capsule 42 mg  42 mg Oral Daily  Massengill, Harrold Donath, MD   42 mg at 08/03/22 0804   metFORMIN (GLUCOPHAGE-XR) 24 hr tablet 500 mg  500 mg Oral Q breakfast Massengill, Harrold Donath, MD   500 mg at 08/03/22 0804   methylphenidate (RITALIN) tablet 10 mg  10 mg Oral Daily Hill, Shelbie Hutching, MD   10 mg at 08/03/22 0803   polyethylene glycol (MIRALAX / GLYCOLAX) packet 17 g  17 g Oral Daily Massengill, Harrold Donath, MD   17 g at 08/03/22 0801   sertraline (ZOLOFT) tablet 100 mg  100 mg Oral Daily Massengill, Harrold Donath, MD   100 mg at 08/03/22 0803   vitamin D3 (CHOLECALCIFEROL) tablet 1,000 Units  1,000 Units Oral Daily Massengill, Harrold Donath, MD   1,000 Units at 08/03/22 0804   PTA Medications: Medications Prior to Admission  Medication Sig Dispense Refill Last Dose   ferrous sulfate 325 (65 FE) MG tablet Take 325 mg by mouth daily.      risperiDONE (RISPERDAL) 1 MG tablet Take 1 tablet (1 mg total) by mouth daily.      risperiDONE (RISPERDAL) 2 MG tablet Take 1 tablet (2 mg total) by mouth at bedtime.       Patient Stressors: Health problems   Marital or family conflict   Medication change or noncompliance    Patient  Strengths: Average or above average intelligence  Supportive family/friends   Treatment Modalities: Medication Management, Group therapy, Case management,  1 to 1 session with clinician, Psychoeducation, Recreational therapy.   Physician Treatment Plan for Primary Diagnosis: Schizoaffective disorder, depressive type (HCC) Long Term Goal(s): Improvement in symptoms so as ready for discharge   Short Term Goals: Ability to verbalize feelings will improve Ability to disclose and discuss suicidal ideas Ability to identify and develop effective coping behaviors will improve Compliance with prescribed medications will improve  Medication Management: Evaluate patient's response, side effects, and tolerance of medication regimen.  Therapeutic Interventions: 1 to 1 sessions, Unit Group sessions and Medication  administration.  Evaluation of Outcomes: Progressing  Physician Treatment Plan for Secondary Diagnosis: Principal Problem:   Schizoaffective disorder, depressive type (HCC) Active Problems:   Insomnia   GAD (generalized anxiety disorder)   Psychosis (HCC)   Vitamin D deficiency   MDD (major depressive disorder), recurrent, severe, with psychosis (HCC)  Long Term Goal(s): Improvement in symptoms so as ready for discharge   Short Term Goals: Ability to verbalize feelings will improve Ability to disclose and discuss suicidal ideas Ability to identify and develop effective coping behaviors will improve Compliance with prescribed medications will improve     Medication Management: Evaluate patient's response, side effects, and tolerance of medication regimen.  Therapeutic Interventions: 1 to 1 sessions, Unit Group sessions and Medication administration.  Evaluation of Outcomes: Not Progressing   RN Treatment Plan for Primary Diagnosis: Schizoaffective disorder, depressive type (HCC) Long Term Goal(s): Knowledge of disease and therapeutic regimen to maintain health will improve  Short Term Goals: Ability to remain free from injury will improve, Ability to verbalize frustration and anger appropriately will improve, Ability to demonstrate self-control, Ability to participate in decision making will improve, Ability to verbalize feelings will improve, Ability to disclose and discuss suicidal ideas, Ability to identify and develop effective coping behaviors will improve, and Compliance with prescribed medications will improve  Medication Management: RN will administer medications as ordered by provider, will assess and evaluate patient's response and provide education to patient for prescribed medication. RN will report any adverse and/or side effects to prescribing provider.  Therapeutic Interventions: 1 on 1 counseling sessions, Psychoeducation, Medication administration, Evaluate responses  to treatment, Monitor vital signs and CBGs as ordered, Perform/monitor CIWA, COWS, AIMS and Fall Risk screenings as ordered, Perform wound care treatments as ordered.  Evaluation of Outcomes: Progressing   LCSW Treatment Plan for Primary Diagnosis: Schizoaffective disorder, depressive type (HCC) Long Term Goal(s): Safe transition to appropriate next level of care at discharge, Engage patient in therapeutic group addressing interpersonal concerns.  Short Term Goals: Engage patient in aftercare planning with referrals and resources, Increase social support, Increase ability to appropriately verbalize feelings, Increase emotional regulation, Facilitate acceptance of mental health diagnosis and concerns, Facilitate patient progression through stages of change regarding substance use diagnoses and concerns, Identify triggers associated with mental health/substance abuse issues, and Increase skills for wellness and recovery  Therapeutic Interventions: Assess for all discharge needs, 1 to 1 time with Social worker, Explore available resources and support systems, Assess for adequacy in community support network, Educate family and significant other(s) on suicide prevention, Complete Psychosocial Assessment, Interpersonal group therapy.  Evaluation of Outcomes: Progressing   Progress in Treatment: Attending groups: Yes. Participating in groups: Yes. Taking medication as prescribed: No. Patient has been found cheeking meds.  Nursing is currently crushing medications and putting in applesauce for med compliance.  Toleration medication: Yes. Family/Significant  other contact made: Yes, individual(s) contacted:  Charnika Herbst [phone] (Mother)  Patient understands diagnosis: No. Discussing patient identified problems/goals with staff: No. Medical problems stabilized or resolved: Yes. Denies suicidal/homicidal ideation: Yes. Issues/concerns per patient self-inventory: No.  New problem(s)  identified: No, Describe:  none reported   New Short Term/Long Term Goal(s):   medication stabilization, elimination of SI thoughts, development of comprehensive mental wellness plan.    Patient Goals:  No additional goals identified at this time. Patient to continue to work towards original goals identified in initial treatment team meeting. CSW will remain available to patient should they voice additional treatment goals.   Discharge Plan or Barriers: Patient continues to remain paranoid toward family and agreeing to safe discharge plan. Pt has guardianship hearing on 08/04/2022.  Patient is not safe to discharge to a shelter.  Patient is not connected to benefits.  Upon discharge   Reason for Continuation of Hospitalization: Anxiety Medication stabilization Other; describe paranoia  Estimated Length of Stay: TBD  Last 3 [place] Suicide Severity Risk Score: Flowsheet Row Ad[facility] (Current) [facility] from [facility]18/2022 in [facility] Office Visit from 04/14/2022 in [facility]  C-SSRS RISK CATEGORY No Risk High Risk No Risk       Last PHQ 2/9 Scores:    04/14/2022   10:46 AM 02/24/2022   11:12 AM 01/13/2022    9:14 AM  Depression screen PHQ 2/9  Decreased Interest 0 0 0  Down, Depressed, Hopeless 0 0 0  PHQ - 2 Score 0 0 0    Scribe for Treatment Team: Beatris Si, LCSW 08/03/2022 11:38 AM

## 2022-08-03 NOTE — Progress Notes (Signed)
   08/03/22 1200  Psych Admission Type (Psych Patients Only)  Admission Status Voluntary  Psychosocial Assessment  Patient Complaints None  Eye Contact Brief  Facial Expression Fixed smile  Affect Appropriate to circumstance  Speech Soft  Interaction Childlike  Motor Activity Slow  Appearance/Hygiene Unremarkable  Behavior Characteristics Appropriate to situation  Mood Pleasant  Thought Process  Coherency Circumstantial  Content Blaming others  Delusions Paranoid  Perception WDL  Hallucination None reported or observed  Judgment Limited  Confusion None  Danger to Self  Current suicidal ideation? Denies  Danger to Others  Danger to Others None reported or observed

## 2022-08-03 NOTE — Progress Notes (Signed)
   08/03/22 2115  Psych Admission Type (Psych Patients Only)  Admission Status Voluntary  Psychosocial Assessment  Patient Complaints None  Eye Contact Fair  Facial Expression Fixed smile  Affect Apprehensive  Speech Soft  Interaction Cautious;Childlike  Motor Activity Slow  Appearance/Hygiene Unremarkable  Behavior Characteristics Appropriate to situation  Mood Pleasant  Thought Process  Coherency Circumstantial  Content Blaming others  Delusions Paranoid  Perception WDL  Hallucination None reported or observed  Judgment Limited  Confusion None  Danger to Self  Current suicidal ideation? Denies  Danger to Others  Danger to Others None reported or observed

## 2022-08-04 DIAGNOSIS — F251 Schizoaffective disorder, depressive type: Secondary | ICD-10-CM | POA: Diagnosis not present

## 2022-08-04 LAB — CLOZAPINE (CLOZARIL)
Clozapine Lvl: 277 ng/mL — ABNORMAL LOW (ref 350–600)
NorClozapine: 152 ng/mL
Total(Cloz+Norcloz): 429 ng/mL

## 2022-08-04 NOTE — Progress Notes (Signed)
   08/04/22 1100  Psych Admission Type (Psych Patients Only)  Admission Status Voluntary  Psychosocial Assessment  Patient Complaints None  Eye Contact Fair  Facial Expression Fixed smile  Affect Apprehensive  Speech Soft  Interaction Childlike  Motor Activity Other (Comment)  Appearance/Hygiene Unremarkable  Behavior Characteristics Cooperative  Mood Pleasant  Thought Process  Coherency Circumstantial  Content Blaming others  Delusions Paranoid  Perception WDL  Hallucination None reported or observed  Judgment Limited  Confusion None  Danger to Self  Current suicidal ideation? Denies  Danger to Others  Danger to Others None reported or observed

## 2022-08-04 NOTE — [facility] Counselor (Signed)
CSW and patient, Cheryl Adams, attended the [place] Guardianship court hearing today (08/04/2022) by WebEx at 1:00pm.  The Sharee Pimple in this case determined that the patient, Cheryl Adams will be appointed a [place] DSS guardian and will be held at the [facility] until the next court hearing on 09/29/2022.  The Sharee Pimple states that she believes the patient would be a danger to herself if not appointed a guardian at this time and if she was allowed to leave the hospital on her own.  CSW has informed the providers of this information.

## 2022-08-04 NOTE — Progress Notes (Signed)
   08/04/22 0545  Sleep  Number of Hours 7

## 2022-08-04 NOTE — Progress Notes (Signed)
   08/04/22 2045  Psych Admission Type (Psych Patients Only)  Admission Status Voluntary  Psychosocial Assessment  Patient Complaints None  Eye Contact Fair  Facial Expression Fixed smile  Affect Apprehensive  Speech Soft  Interaction Cautious;Childlike  Motor Activity Slow  Appearance/Hygiene Unremarkable  Behavior Characteristics Cooperative  Mood Pleasant  Thought Process  Coherency Circumstantial  Content Blaming others  Delusions Paranoid  Perception WDL  Hallucination None reported or observed  Judgment Limited  Confusion None  Danger to Self  Current suicidal ideation? Denies  Danger to Others  Danger to Others None reported or observed

## 2022-08-04 NOTE — Progress Notes (Signed)
Adult Psychoeducational Group Note  Date:  08/04/2022 Time:  10:31 PM  Group Topic/Focus:  Wrap-Up Group:   The focus of this group is to help patients review their daily goal of treatment and discuss progress on daily workbooks.  Participation Level:  Did Not Attend  Participation Quality:   Did Not Attend  Affect:   Did Not Attend  Cognitive:   Did Not Attend  Insight: None  Engagement in Group:   Did Not Attend  Modes of Intervention:   Did Not Attend  Additional Comments:  Pt was encouraged to attend wrap up group but did not attend.  Felipa Furnace 08/04/2022, 10:31 PM
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
## 2022-08-04 NOTE — BHH Counselor (Signed)
CSW spoke with Pricilla Handler at [facility] [phone] who states that the Pt is coming up on the waiting list for services.  CSW explained that DSS was assigned as Guardian today and that the Pt would be remaining in the hospital until the next court date on 09/29/2022.  Mrs. Shea Evans states that she will keep the Pt on the waiting list and will begin services in approximately Mid-October.  CSW will inform the providers of this information.

## 2022-08-04 NOTE — BHH Group Notes (Signed)
Adult Psychoeducational Group Note  Date:  08/04/2022 Time:  9:43 AM  Group Topic/Focus:  Goals Group:   The focus of this group is to help patients establish daily goals to achieve during treatment and discuss how the patient can incorporate goal setting into their daily lives to aide in recovery.  Participation Level:  Minimal  Participation Quality:  Attentive  Affect:  Flat  Cognitive:  Confused  Insight: Improving  Engagement in Group:  Improving  Modes of Intervention:  Education  Additional Comments:  Pt attend Goal group but wasn't able to set a goal .  Krysteena Stalker, Sharen Counter 08/04/2022, 9:43 AM

## 2022-08-05 DIAGNOSIS — F251 Schizoaffective disorder, depressive type: Secondary | ICD-10-CM | POA: Diagnosis not present

## 2022-08-05 NOTE — Progress Notes (Signed)
   08/05/22 2045  Psych Admission Type (Psych Patients Only)  Admission Status Voluntary  Psychosocial Assessment  Patient Complaints None  Eye Contact Fair  Facial Expression Fixed smile  Affect Apprehensive  Speech Soft  Interaction Cautious;Childlike  Motor Activity Slow  Appearance/Hygiene Unremarkable  Behavior Characteristics Cooperative  Mood Pleasant  Thought Process  Coherency Circumstantial  Content Blaming others  Delusions Paranoid  Perception WDL  Hallucination None reported or observed  Judgment Limited  Confusion None  Danger to Self  Current suicidal ideation? Denies  Danger to Others  Danger to Others None reported or observed

## 2022-08-05 NOTE — Progress Notes (Signed)
   08/05/22 1000  Psych Admission Type (Psych Patients Only)  Admission Status Voluntary  Psychosocial Assessment  Patient Complaints None  Eye Contact Fair  Facial Expression Fixed smile  Affect Apprehensive  Speech Soft  Interaction Cautious;Childlike  Motor Activity Slow  Appearance/Hygiene Unremarkable  Behavior Characteristics Cooperative  Mood Pleasant  Aggressive Behavior  Effect No apparent injury  Thought Process  Coherency Circumstantial  Content Blaming others  Delusions Paranoid  Perception WDL  Hallucination None reported or observed  Judgment Limited  Confusion None  Danger to Self  Current suicidal ideation? Denies  Danger to Others  Danger to Others None reported or observed

## 2022-08-05 NOTE — Group Note (Signed)
   Date/Time: 08/05/2022 @ 1pm  Type of Therapy and Topic:  Group Therapy:  triggers   Participation Level:   Minimal  Description of Group:   Recognizing Triggers: Patients defined triggers and discussed the importance of recognizing their personal warning signs. Patients identified their own triggers and how they tend to cope with stressful situations. Patients discussed areas such as people, places, things, and thoughts that rigger certain emotions for them. CSW provided support to patients and discussed safety planning for when these triggers occur. Group participants had opportunities to share openly with the group and participate in a group discussion while providing support and feedback to their peers.  Therapeutic Goals: Patient will identify triggers that are contributing to a problem in their life Patient will identify unwanted behaviors and feelings associated with a trigger.  Patient will share with other group members strategies to confront and avoid triggers so that they may be able to react appropriately to triggers in daily life.    Summary of Patient Progress: Patient participated appropriately in group.  Patient discussed physical sensations of triggers including feeling hot.  Patient participated in urge surfing techniques and discussed how deep breathing helps cope with triggers.      Therapeutic Modalities:   Cognitive Behavioral Therapy Solution Focused Therapy Motivational Interviewing Family Systems Approach   Qais Jowers [place], LCSW, LCAS Clincal Social Worker  [facility]
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
## 2022-08-05 NOTE — Progress Notes (Signed)
   08/05/22 0530  Sleep  Number of Hours 8

## 2022-08-05 NOTE — Group Note (Signed)
Recreation Therapy Group Note   Group Topic:Communication  Group Date: 08/05/2022 Start Time: 1000 End Time: 1030 Facilitators: Caroll Rancher, LRT,CTRS Location: [address] Dayroom   Goal Area(s) Addresses:  Patient will effectively listen to complete activity.  Patient will identify communication skills used to make activity successful.  Patient will identify how skills used during activity can be used to reach post d/c goals.     Group Description: Geometric Drawings.  Three volunteers from the peer group will be shown an abstract picture with a particular arrangement of geometrical shapes.  Each round, one 'speaker' will describe the pattern, as accurately as possible without revealing the image to the group.  The remaining group members will listen and draw the picture to reflect how it is described to them. Patients with the role of 'listener' cannot ask clarifying questions but, may request that the speaker repeat a direction. Once the drawings are complete, the presenter will show the rest of the group the picture and compare how close each person came to drawing the picture. LRT will facilitate a post-activity discussion regarding effective communication and the importance of planning, listening, and asking for clarification in daily interactions with others.   Affect/Mood: Appropriate   Participation Level: Engaged   Participation Quality: Independent   Behavior: Appropriate   Speech/Thought Process: Focused   Insight: Good   Judgement: Good   Modes of Intervention: Activity   Patient Response to Interventions:  Engaged   Education Outcome:  Acknowledges education and In group clarification offered    Clinical Observations/Individualized Feedback: Pt was attentive and engaged.  Pt was the first presenter.  Pt felt she did ok but struggled with describing some of the parts of her picture.  Pt was attentive during the presentation from her peers.    Plan: Continue to  engage patient in RT group sessions 2-3x/week.   Caroll Rancher, LRT,CTRS 08/05/2022 11:46 AM

## 2022-08-05 NOTE — Progress Notes (Signed)
[facility] Group Notes:  (Nursing/MHT/Case Management/Adjunct)  Date:  08/05/2022  Time:  2000  Type of Therapy:   wrap up group  Participation Level:  Active  Participation Quality:  Appropriate, Attentive, Sharing, and Supportive  Affect:  Blunted  Cognitive:  Lacking  Insight:  Improving  Engagement in Group:  Engaged  Modes of Intervention:  Clarification, Education, and Support  Summary of Progress/Problems: Positive thinking and positive change were discussed.   Johann Capers S 08/05/2022, 9:01 PM

## 2022-08-05 NOTE — Progress Notes (Signed)
Adult Psychoeducational Group Note  Date:  08/05/2022 Time:  9:34 AM  Group Topic/Focus:  Goals Group:   The focus of this group is to help patients establish daily goals to achieve during treatment and discuss how the patient can incorporate goal setting into their daily lives to aide in recovery.  Participation Level:  Active  Participation Quality:  Appropriate  Affect:  Appropriate  Cognitive:  Appropriate  Insight: Appropriate  Engagement in Group:  Engaged  Modes of Intervention:  Discussion  Additional Comments:  The patient participated in group.  Octavio Manns 08/05/2022, 9:34 AM

## 2022-08-05 NOTE — Progress Notes (Signed)
Pt was asked by staff to shower before bed. Pt requested staff member to be in the room to ensure her safety. This writer remained in the bedroom while patient was in the bathroom. Pt looked out or spoke out numerous times to ensure staff was still in the room. Pt remains safe and is recently clean.

## 2022-08-06 DIAGNOSIS — F251 Schizoaffective disorder, depressive type: Secondary | ICD-10-CM | POA: Diagnosis not present

## 2022-08-06 MED ORDER — CLONAZEPAM 0.25 MG PO TBDP
0.2500 mg | ORAL_TABLET | Freq: Every day | ORAL | Status: DC
  Administered 2022-08-07 – 2022-08-10 (×4): 0.25 mg via ORAL
  Filled 2022-08-06 (×4): qty 1

## 2022-08-06 NOTE — Progress Notes (Signed)
[facility] Group Notes:  (Nursing/MHT/Case Management/Adjunct)  Date:  08/06/2022  Time:  2000  Type of Therapy:   wrap up group  Participation Level:  Active  Participation Quality:  Appropriate, Attentive, Sharing, and Supportive  Affect:  Appropriate  Cognitive:  Alert  Insight:  Improving  Engagement in Group:  Engaged  Modes of Intervention:  Clarification, Education, and Support  Summary of Progress/Problems: Positive thinking and self-care were discussed.   Johann Capers S 08/06/2022, 8:25 PM

## 2022-08-06 NOTE — Progress Notes (Signed)
   08/06/22 0500  Sleep  Number of Hours 7.5

## 2022-08-06 NOTE — Progress Notes (Signed)
   08/06/22 1000  Psych Admission Type (Psych Patients Only)  Admission Status Voluntary  Psychosocial Assessment  Patient Complaints None  Eye Contact Fair  Facial Expression Fixed smile  Affect Appropriate to circumstance  Speech Soft  Interaction Childlike  Motor Activity Slow  Appearance/Hygiene Unremarkable  Behavior Characteristics Cooperative  Mood Pleasant  Thought Process  Coherency Circumstantial  Content WDL  Delusions Paranoid  Perception WDL  Hallucination None reported or observed  Judgment Limited  Confusion None  Danger to Self  Current suicidal ideation? Denies  Danger to Others  Danger to Others None reported or observed

## 2022-08-06 NOTE — Group Note (Signed)
Occupational Therapy Group Note  Group Topic:Coping Skills  Group Date: 08/06/2022 Start Time: 1400 End Time: 1455 Facilitators: Ted Mcalpine, OT   Group Description: Group encouraged increased engagement and participation through discussion and activity focused on "Coping Ahead." Patients were split up into teams and selected a card from a stack of positive coping strategies. Patients were instructed to act out/charade the coping skill for other peers to guess and receive points for their team. Discussion followed with a focus on identifying additional positive coping strategies and patients shared how they were going to cope ahead over the weekend while continuing hospitalization stay.  Therapeutic Goal(s): Identify positive vs negative coping strategies. Identify coping skills to be used during hospitalization vs coping skills outside of hospital/at home Increase participation in therapeutic group environment and promote engagement in treatment   Participation Level: Active and Engaged   Participation Quality: Independent   Behavior: Appropriate   Speech/Thought Process: Relevant   Affect/Mood: Appropriate   Insight: Fair   Judgement: Fair   Individualization: pt was engaged in their participation of group discussion/activity. New skills were identified  Modes of Intervention: Discussion and Education  Patient Response to Interventions:  Engaged, Interested , and Receptive   Plan: Continue to engage patient in OT groups 2 - 3x/week.  08/06/2022  Ted Mcalpine, OT Kerrin Champagne, OT
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
## 2022-08-06 NOTE — Progress Notes (Signed)
   08/06/22 2045  Psych Admission Type (Psych Patients Only)  Admission Status Voluntary  Psychosocial Assessment  Patient Complaints None  Eye Contact Fair  Facial Expression Fixed smile  Affect Apprehensive  Speech Soft  Interaction Cautious;Childlike  Motor Activity Slow  Appearance/Hygiene Unremarkable  Behavior Characteristics Cooperative  Mood Pleasant  Thought Process  Coherency Circumstantial  Content Blaming others  Delusions Paranoid  Perception WDL  Hallucination None reported or observed  Judgment Limited  Confusion None  Danger to Self  Current suicidal ideation? Denies  Danger to Others  Danger to Others None reported or observed

## 2022-08-07 DIAGNOSIS — F251 Schizoaffective disorder, depressive type: Secondary | ICD-10-CM | POA: Diagnosis not present

## 2022-08-07 MED ORDER — CLOZAPINE 100 MG PO TABS
300.0000 mg | ORAL_TABLET | Freq: Every day | ORAL | Status: DC
  Administered 2022-08-07 – 2022-08-17 (×11): 300 mg via ORAL
  Filled 2022-08-07 (×13): qty 3

## 2022-08-07 NOTE — Progress Notes (Signed)
   08/07/22 0530  Sleep  Number of Hours 8    

## 2022-08-07 NOTE — Progress Notes (Signed)
   08/07/22 2045  Psych Admission Type (Psych Patients Only)  Admission Status Voluntary  Psychosocial Assessment  Patient Complaints None  Eye Contact Fair  Facial Expression Fixed smile  Affect Apprehensive  Speech Soft  Interaction Cautious;Childlike  Motor Activity Slow  Appearance/Hygiene Unremarkable  Behavior Characteristics Cooperative  Mood Pleasant  Thought Process  Coherency Circumstantial  Content Blaming others  Delusions Paranoid  Perception WDL  Hallucination None reported or observed  Judgment Limited  Confusion None  Danger to Self  Current suicidal ideation? Denies  Danger to Others  Danger to Others None reported or observed
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
## 2022-08-07 NOTE — Progress Notes (Signed)
Pt appearing more anxious this evening, pt appearing to have issue trying to go to sleep and relax.

## 2022-08-07 NOTE — Group Note (Signed)
[facility] LCSW Group Therapy Note   Group Date: 08/07/2022 Start Time: 1115 End Time: 1215   Type of Therapy and Topic: Group Therapy: Avoiding Self-Sabotaging and Enabling Behaviors  Participation Level: Active  Mood: Euthymic    Description of Group:  In this group, patients will learn how to identify obstacles, self-sabotaging and enabling behaviors, as well as: what are they, why do we do them and what needs these behaviors meet. Discuss unhealthy relationships and how to have positive healthy boundaries with those that sabotage and enable. Explore aspects of self-sabotage and enabling in yourself and how to limit these self-destructive behaviors in everyday life.   Therapeutic Goals: 1. Patient will identify one obstacle that relates to self-sabotage and enabling behaviors 2. Patient will identify one personal self-sabotaging or enabling behavior they did prior to admission 3. Patient will state a plan to change the above identified behavior 4. Patient will demonstrate ability to communicate their needs through discussion and/or role play.    Summary of Patient Progress: Patient was present for the entirety of the group session. Patient was an active listener and participated in the topic of discussion, provided helpful advice to others, and added nuance to topic of conversation. Patient shared often in group leading to inciteful conversation about how maladaptive behaviors could be better understood. Patient discussed potential thoughts and emotions that can lead to certain behaviors.    Therapeutic Modalities:  Cognitive Behavioral Therapy Person-Centered Therapy Motivational Interviewing    Corky Crafts, [place]

## 2022-08-07 NOTE — Group Note (Signed)
Recreation Therapy Group Note   Group Topic:Leisure Education  Group Date: 08/07/2022 Start Time: 1000 End Time: 1030 Facilitators: Caroll Rancher, LRT,CTRS Location: [address] Dayroom   Goal Area(s) Addresses:  Patient will effectively work with peer towards shared goal.  Patient will identify skills used to make activity successful.  Patient will identify how skills used during activity can be used to reach post d/c goals.   Group Description: Straw Bridge. In teams of 3-5, patients were given 15 plastic drinking straws and an equal length of masking tape. Using the materials provided, patients were instructed to build a free standing bridge-like structure to suspend an everyday item (ex: puzzle box) off of the floor or table surface. All materials were required to be used by the team in their design. LRT facilitated post-activity discussion reviewing team process. Patients were encouraged to reflect how the skills used in this activity can be generalized to daily life post discharge.    Affect/Mood: Appropriate   Participation Level: Engaged   Participation Quality: Independent   Behavior: Appropriate   Speech/Thought Process: Focused   Insight: Good   Judgement: Good   Modes of Intervention: STEM Activity   Patient Response to Interventions:  Engaged   Education Outcome:  Acknowledges education and In group clarification offered    Clinical Observations/Individualized Feedback: Pt was focused on activity.  Pt took the leader role with her partner.  Pt came up with the concept.  Pt and partner were able to put the concept into formation.  Pt was appropriate and engaged throughout group session.     Plan: Continue to engage patient in RT group sessions 2-3x/week.   Caroll Rancher, LRT,CTRS 08/07/2022 12:06 PM

## 2022-08-07 NOTE — B[facility] Group Notes (Signed)
Adult Psychoeducational Group Note  Date:  08/07/2022 Time:  8:24 PM  Group Topic/Focus:  Wrap-Up Group:   The focus of this group is to help patients review their daily goal of treatment and discuss progress on daily workbooks.  Participation Level:  Active  Participation Quality:  Appropriate and Attentive  Affect:  Appropriate  Cognitive:  Alert and Appropriate  Insight: Appropriate and Good  Engagement in Group:  Engaged  Modes of Intervention:  Discussion and Education  Additional Comments:  pt attended and participated in wrap up group this evening and rated their day a 4/10. Pt states that they went to the gym but going outside would have made their day better. Pt completed their goal, which was to meditate by rocking and breathing. Pt has no complaints to report at this moment.   Chrisandra Netters 08/07/2022, 8:24 PM

## 2022-08-07 NOTE — BHH Group Notes (Signed)
Spirituality group facilitated by Kathleen Argue, BCC.   Group Description: Group focused on topic of hope. Patients participated in facilitated discussion around topic, connecting with one another around experiences and definitions for hope. Group members engaged with visual explorer photos, reflecting on what hope looks like for them today. Group engaged in discussion around how their definitions of hope are present today in hospital.   Modalities: Psycho-social ed, Adlerian, Narrative, MI   Patient Progress: Cheryl Adams attended group and actively engaged and participated in group conversation. She shared that making connections with people here gives her hope.  [address], Bcc Pager, [phone]

## 2022-08-07 NOTE — Progress Notes (Signed)
   08/07/22 1000  Psych Admission Type (Psych Patients Only)  Admission Status Voluntary  Psychosocial Assessment  Patient Complaints None  Eye Contact Fair  Facial Expression Fixed smile  Affect Appropriate to circumstance  Speech Soft  Interaction Childlike  Motor Activity Slow  Appearance/Hygiene Unremarkable  Behavior Characteristics Cooperative  Mood Pleasant  Thought Process  Coherency Circumstantial  Content Blaming others  Delusions Paranoid  Perception WDL  Hallucination None reported or observed  Judgment Limited  Confusion None  Danger to Self  Current suicidal ideation? Denies  Danger to Others  Danger to Others None reported or observed

## 2022-08-08 DIAGNOSIS — F251 Schizoaffective disorder, depressive type: Secondary | ICD-10-CM | POA: Diagnosis not present
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
## 2022-08-08 NOTE — Progress Notes (Signed)
Adult Psychoeducational Group Note  Date:  08/08/2022 Time:  8:23 PM  Group Topic/Focus:  Wrap-Up Group:   The focus of this group is to help patients review their daily goal of treatment and discuss progress on daily workbooks.  Participation Level:  Active  Participation Quality:  Appropriate and Attentive  Affect:  Appropriate  Cognitive:  Appropriate  Insight: Appropriate  Engagement in Group:  Engaged  Modes of Intervention:  Discussion  Additional Comments:   Pt states that she had a good day. Stating she was able to attend groups and communicate more with her peers and staff. Pt denies everything but confirms hearing from her doctors today. Pt expressed interest in developing a routine to stay on her medication when D/C.  Vevelyn Pat 08/08/2022, 8:23 PM

## 2022-08-08 NOTE — [facility] Group Notes (Signed)
Goals Group 08/08/2022   Group Focus: affirmation, clarity of thought, and goals/reality orientation Treatment Modality:  Psychoeducation Interventions utilized were assignment, group exercise, and support Purpose: To be able to understand and verbalize the reason for their admission to the hospital. To understand that the medication helps with their chemical imbalance but they also need to work on their choices in life. To be challenged to develop a list of 30 positives about themselves. Also introduce the concept that "feelings" are not reality.  Participation Level:  Active  Participation Quality:  Appropriate  Affect:  Appropriate  Cognitive:  Appropriate  Insight:  Improving  Engagement in Group:  Engaged  Additional Comments:  Pt rates her energy at a 4/10. States she is here is management of her mental health issues. [name] affect is more spontaneious and her dialog has increased.  Dione Housekeeper

## 2022-08-08 NOTE — Progress Notes (Signed)
   08/08/22 2040  Psych Admission Type (Psych Patients Only)  Admission Status Voluntary  Psychosocial Assessment  Patient Complaints None  Eye Contact Brief  Facial Expression Flat  Affect Appropriate to circumstance  Speech Soft;Slow  Interaction Childlike  Motor Activity Slow  Appearance/Hygiene Unremarkable  Behavior Characteristics Cooperative;Appropriate to situation;Calm  Mood Pleasant  Thought Process  Coherency Circumstantial  Content Blaming others  Delusions Paranoid  Perception WDL  Hallucination None reported or observed  Judgment Limited  Confusion None  Danger to Self  Current suicidal ideation? Denies

## 2022-08-08 NOTE — Progress Notes (Signed)
   08/08/22 0515  Sleep  Number of Hours 7

## 2022-08-08 NOTE — Progress Notes (Signed)
Pt is A&OX4, calm, flat, paranoid, denies suicidal ideations, denies homicidal ideations, denies auditory hallucinations and denies visual hallucinations. Pt verbally agrees to approach staff if these become apparent and before harming self or others. Pt denies experiencing nightmares. Mood and affect are congruent. Pt appetite is ok. No complaints of anxiety, distress, pain and/or discomfort at this time. [name] memory appears to be grossly intact, and Pt hasn't displayed any injurious behaviors. Pt is medication compliant. There's no evidence of suicidal intent. Psychomotor activity was WNL. No s/s of Parkinson, Dystonia, Akathisia and/or Tardive Dyskinesia noted.

## 2022-08-08 NOTE — Group Note (Signed)
[facility] LCSW Group Therapy Note  Date/Time:    08/08/2022 10:00-11:00AM  Type of Therapy and Topic:  Group Therapy:  Shame and its Impact on My Life  Participation Level:  Active   Description of Group:  The focus of this group was to examine our tendency to be hyper-critical of self and how this leads to feelings of worthlessness, hopelessness, and shame.  Patients were guided to the concept that shame is universal and is worsened by being kept hidden, but improved by being revealed.  We discussed how feeling unworthy is the result of shame and discussed the differences between guilt  ("I did something bad" "I made a mistake" "I did something stupid") and shame ("I am bad" "I am a mistake" "I am stupid") .  We discussed that feelings are not necessarily based in facts.  We also talked about what happens when we do something to numb our negative feelings and how that actually numbs our positive feelings at the same time.  Therapeutic Goals Identify statements patients automatically say to themselves, "I'll be worthy when...."  Examine how this is unkind to ourselves because it indicates we cannot be worthy until some far-reaching, possibly even unreachable, goal is achieved. Talk about the frequent use of unhealthy coping skills used when feeling unworthy Allow patients to discuss their shame out loud in order to reduce its power over them  Summary of Patient Progress: During group, patient expressed "I'll be worthy when I am able to communicate what I want for myself and have the self-assurance to do it."   She participated fully in group and this one of the only times she has done so spontaneously in groups held by this CSW.  She was logical and goal-oriented.  Therapeutic Modalities Processing  Ambrose Mantle, LCSW 08/08/2022, 3:05 PM

## 2022-08-09 DIAGNOSIS — F251 Schizoaffective disorder, depressive type: Secondary | ICD-10-CM | POA: Diagnosis not present

## 2022-08-09 NOTE — Progress Notes (Signed)
Adult Psychoeducational Group Note  Date:  08/09/2022 Time:  8:46 PM  Group Topic/Focus:  Wrap-Up Group:   The focus of this group is to help patients review their daily goal of treatment and discuss progress on daily workbooks.  Participation Level:  Active  Participation Quality:  Appropriate and Attentive  Affect:  Appropriate  Cognitive:  Appropriate  Insight: Appropriate  Engagement in Group:  Engaged  Modes of Intervention:  Discussion  Additional Comments:   Pt states that she had a good day due to the fact that she was able to meditate and go to one music therapy group. Pt confirms communication with her care team and endorses slight feelings of anxiety.  Vevelyn Pat 08/09/2022, 8:46 PM

## 2022-08-09 NOTE — Group Note (Signed)
[facility] LCSW Group Therapy Note  Date/Time:  08/09/2022  11:00AM-12:00PM  Type of Therapy and Topic:  Group Therapy:  Music and Mood  Participation Level:  Active   Description of Group: In this process group, members listened to a variety of genres of music and identified that different types of music evoke different responses.  Patients were encouraged to identify music that was soothing for them and music that was energizing for them.  Patients discussed how this knowledge can help with wellness and recovery in various ways including managing depression and anxiety as well as encouraging healthy sleep habits.    Therapeutic Goals: Patients will explore the impact of different varieties of music on mood Patients will verbalize the thoughts they have when listening to different types of music Patients will identify music that is soothing to them as well as music that is energizing to them Patients will discuss how to use this knowledge to assist in maintaining wellness and recovery Patients will explore the use of music as a coping skill  Summary of Patient Progress:  At the beginning of group, patient expressed her mood was a little anxious although she felt rested.  She became tearful several times during the group, saying that she was crying because the songs made her feel "vulnerable."  She asked for a song to be played that CSW was unable to play because of the profanity and inappropriate subject matter of the song.  At the end of group, patient expressed her mood was "some relief."    Therapeutic Modalities: Solution Focused Brief Therapy Activity   Ambrose Mantle, LCSW

## 2022-08-09 NOTE — Progress Notes (Signed)
   08/09/22 2100  Psych Admission Type (Psych Patients Only)  Admission Status Voluntary  Psychosocial Assessment  Patient Complaints None  Eye Contact Brief  Facial Expression Flat  Affect Appropriate to circumstance  Speech Soft;Slow  Interaction Childlike  Motor Activity Slow  Appearance/Hygiene Unremarkable  Behavior Characteristics Cooperative;Appropriate to situation  Mood Pleasant  Thought Process  Coherency Circumstantial  Content Blaming others  Delusions Paranoid  Perception WDL  Hallucination None reported or observed  Judgment Limited  Confusion None  Danger to Self  Current suicidal ideation? Denies

## 2022-08-09 NOTE — Progress Notes (Signed)
   08/09/22 0800  Psych Admission Type (Psych Patients Only)  Admission Status Voluntary  Psychosocial Assessment  Patient Complaints Hyperactivity;None  Eye Contact Brief  Facial Expression Flat  Affect Appropriate to circumstance  Speech Soft;Slow  Interaction Childlike  Motor Activity Slow  Appearance/Hygiene Unremarkable  Behavior Characteristics Cooperative;Appropriate to situation  Mood Pleasant  Thought Process  Coherency Circumstantial  Content Blaming others  Delusions Paranoid  Perception WDL  Hallucination None reported or observed  Judgment Limited  Confusion None  Danger to Self  Current suicidal ideation? Denies  Agreement Not to Harm Self Yes  Description of Agreement Verbal contract  Danger to Others  Danger to Others None reported or observed

## 2022-08-09 NOTE — [facility] Group Notes (Signed)
Adult Psychoeducational Group Note Date:  08/09/2022 Time:  0900-1045 Group Topic/Focus: PROGRESSIVE RELAXATION. A group where deep breathing is taught and tensing and relaxation muscle groups is used. Imagery is used as well.  Pts are asked to imagine 3 pillars that hold them up when they are not able to hold themselves up and to share that with the group.  Participation Level:  Active  Participation Quality:  Appropriate  Affect:  Appropriate  Cognitive:  Oriented  Insight: Improving  Engagement in Group:  Engaged  Modes of Intervention:  Activity, Discussion, Education, and Support  Additional Comments:  Pt rates her energy at a 4/10. Pt has taken the class several times and helped to begin the group with imagery of her own. "Walking along the beach, standing on a rock, seeing the waves coming in and going back out to sea" She was praised for her ability to do the beginning of a progressive relaxation group.  Dione Housekeeper
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
## 2022-08-09 NOTE — Plan of Care (Signed)
?  Problem: Activity: ?Goal: Interest or engagement in activities will improve ?Outcome: Progressing ?Goal: Sleeping patterns will improve ?Outcome: Progressing ?  ?Problem: Coping: ?Goal: Ability to verbalize frustrations and anger appropriately will improve ?Outcome: Progressing ?Goal: Ability to demonstrate self-control will improve ?Outcome: Progressing ?  ?Problem: Safety: ?Goal: Periods of time without injury will increase ?Outcome: Progressing ?  ?

## 2022-08-10 ENCOUNTER — Encounter (HOSPITAL_COMMUNITY): Payer: Self-pay

## 2022-08-10 DIAGNOSIS — F251 Schizoaffective disorder, depressive type: Secondary | ICD-10-CM | POA: Diagnosis not present

## 2022-08-10 LAB — CBC WITH DIFFERENTIAL/PLATELET
Abs Immature Granulocytes: 0.07 10*3/uL (ref 0.00–0.07)
Basophils Absolute: 0 10*3/uL (ref 0.0–0.1)
Basophils Relative: 1 %
Eosinophils Absolute: 0.2 10*3/uL (ref 0.0–0.5)
Eosinophils Relative: 4 %
HCT: 38.4 % (ref 36.0–46.0)
Hemoglobin: 12 g/dL (ref 12.0–15.0)
Immature Granulocytes: 1 %
Lymphocytes Relative: 29 %
Lymphs Abs: 1.7 10*3/uL (ref 0.7–4.0)
MCH: 26.3 pg (ref 26.0–34.0)
MCHC: 31.3 g/dL (ref 30.0–36.0)
MCV: 84 fL (ref 80.0–100.0)
Monocytes Absolute: 0.4 10*3/uL (ref 0.1–1.0)
Monocytes Relative: 6 %
Neutro Abs: 3.5 10*3/uL (ref 1.7–7.7)
Neutrophils Relative %: 59 %
Platelets: 243 10*3/uL (ref 150–400)
RBC: 4.57 MIL/uL (ref 3.87–5.11)
RDW: 14.1 % (ref 11.5–15.5)
WBC: 5.9 10*3/uL (ref 4.0–10.5)
nRBC: 0 % (ref 0.0–0.2)

## 2022-08-10 NOTE — Progress Notes (Signed)
Adult Psychoeducational Group Note  Date:  08/10/2022 Time:  8:39 PM  Group Topic/Focus:  Wrap-Up Group:   The focus of this group is to help patients review their daily goal of treatment and discuss progress on daily workbooks.  Participation Level:  Active  Participation Quality:  Appropriate  Affect:  Appropriate  Cognitive:  Appropriate  Insight: Appropriate  Engagement in Group:  Engaged  Modes of Intervention:  Discussion  Additional Comments:   Pt states that she had a good day and was able to meditate and go to groups during the day. Pt denies everything and states that she was able to speak with her care team today.  Cheryl Adams 08/10/2022, 8:39 PM

## 2022-08-10 NOTE — Progress Notes (Signed)
   08/10/22 2115  Psych Admission Type (Psych Patients Only)  Admission Status Voluntary  Psychosocial Assessment  Patient Complaints None  Eye Contact Fair  Facial Expression Sad  Affect Appropriate to circumstance  Speech Soft  Interaction Cautious;Childlike  Motor Activity Slow  Appearance/Hygiene Disheveled  Behavior Characteristics Cooperative  Mood Pleasant;Preoccupied  Aggressive Behavior  Effect No apparent injury  Thought Process  Coherency Circumstantial  Content Blaming others  Delusions Paranoid  Perception WDL  Hallucination None reported or observed  Judgment Limited  Confusion None  Danger to Self  Current suicidal ideation? Denies  Danger to Others  Danger to Others None reported or observed

## 2022-08-10 NOTE — Progress Notes (Signed)
   08/10/22 0800  Psych Admission Type (Psych Patients Only)  Admission Status Voluntary  Psychosocial Assessment  Patient Complaints None  Eye Contact Brief  Facial Expression [place]  Affect Appropriate to circumstance  Speech Soft;Slow  Interaction Childlike  Motor Activity Slow  Appearance/Hygiene Unremarkable  Behavior Characteristics Cooperative  Mood Pleasant  Thought Process  Coherency Circumstantial  Content Blaming others  Delusions Paranoid  Perception WDL  Hallucination None reported or observed  Judgment Limited  Confusion None  Danger to Self  Current suicidal ideation? Denies  Self-Injurious Behavior No self-injurious ideation or behavior indicators observed or expressed   Agreement Not to Harm Self Yes  Description of Agreement Verbal contract  Danger to Others  Danger to Others None reported or observed

## 2022-08-10 NOTE — BH IP Treatment Plan (Signed)
Interdisciplinary Treatment and Diagnostic Plan Update  08/10/2022 Time of Session: 0830 Cheryl Adams MRN: [id number]  Principal Diagnosis: Schizoaffective disorder, depressive type [facility])  Secondary Diagnoses: Principal Problem:   Schizoaffective disorder, depressive type (HCC) Active Problems:   GAD (generalized anxiety disorder)   Vitamin D deficiency   Current Medications:  Current Facility-Administered Medications  Medication Dose Route Frequency Provider Last Rate Last Admin   acetaminophen (TYLENOL) tablet 500 mg  500 mg Oral Q6H PRN Massengill, Harrold Donath, MD       amLODipine (NORVASC) tablet 10 mg  10 mg Oral Daily Massengill, Harrold Donath, MD   10 mg at 08/10/22 0815   antiseptic oral rinse (BIOTENE) solution 15 mL  15 mL Mouth Rinse 5 X Daily PRN Comer Locket, MD       atenolol (TENORMIN) tablet 25 mg  25 mg Oral Daily Massengill, Harrold Donath, MD   25 mg at 08/10/22 0815   clonazePAM (KLONOPIN) disintegrating tablet 0.25 mg  0.25 mg Oral Daily Massengill, Nathan, MD   0.25 mg at 08/10/22 0815   cloZAPine (CLOZARIL) tablet 300 mg  300 mg Oral QHS Massengill, Harrold Donath, MD   300 mg at 08/09/22 2047   docusate sodium (COLACE) capsule 100 mg  100 mg Oral BID Massengill, Harrold Donath, MD   100 mg at 08/10/22 0815   ferrous sulfate tablet 325 mg  325 mg Oral Daily Princess Bruins, DO   325 mg at 08/10/22 0815   hydrOXYzine (ATARAX) tablet 25 mg  25 mg Oral TID PRN Phineas Inches, MD   25 mg at 06/17/22 2027   OLANZapine zydis (ZYPREXA) disintegrating tablet 5 mg  5 mg Oral Q8H PRN Massengill, Harrold Donath, MD   5 mg at 05/24/22 2154   And   LORazepam (ATIVAN) tablet 1 mg  1 mg Oral Q8H PRN Massengill, Harrold Donath, MD   1 mg at 07/03/22 2150   And   ziprasidone (GEODON) injection 20 mg  20 mg Intramuscular Q8H PRN Massengill, Harrold Donath, MD       metFORMIN (GLUCOPHAGE-XR) 24 hr tablet 500 mg  500 mg Oral Q breakfast Massengill, Nathan, MD   500 mg at 08/10/22 0815   methylphenidate (RITALIN) tablet 10 mg  10 mg  Oral Daily Hill, Shelbie Hutching, MD   10 mg at 08/10/22 0815   polyethylene glycol (MIRALAX / GLYCOLAX) packet 17 g  17 g Oral Daily Massengill, Harrold Donath, MD   17 g at 08/10/22 0817   sertraline (ZOLOFT) tablet 100 mg  100 mg Oral Daily Massengill, Harrold Donath, MD   100 mg at 08/10/22 0815   vitamin D3 (CHOLECALCIFEROL) tablet 1,000 Units  1,000 Units Oral Daily Massengill, Harrold Donath, MD   1,000 Units at 08/10/22 0816   PTA Medications: Medications Prior to Admission  Medication Sig Dispense Refill Last Dose   ferrous sulfate 325 (65 FE) MG tablet Take 325 mg by mouth daily.      risperiDONE (RISPERDAL) 1 MG tablet Take 1 tablet (1 mg total) by mouth daily.      risperiDONE (RISPERDAL) 2 MG tablet Take 1 tablet (2 mg total) by mouth at bedtime.       Patient Stressors: Health problems   Marital or family conflict   Medication change or noncompliance    Patient Strengths: Average or above average intelligence  Supportive family/friends   Treatment Modalities: Medication Management, Group therapy, Case management,  1 to 1 session with clinician, Psychoeducation, Recreational therapy.   Physician Treatment Plan for Primary Diagnosis: Schizoaffective disorder, depressive type (  HCC) Long Term Goal(s): Improvement in symptoms so as ready for discharge   Short Term Goals: Ability to verbalize feelings will improve Ability to disclose and discuss suicidal ideas Ability to identify and develop effective coping behaviors will improve Compliance with prescribed medications will improve  Medication Management: Evaluate patient's response, side effects, and tolerance of medication regimen.  Therapeutic Interventions: 1 to 1 sessions, Unit Group sessions and Medication administration.  Evaluation of Outcomes: Progressing  Physician Treatment Plan for Secondary Diagnosis: Principal Problem:   Schizoaffective disorder, depressive type (HCC) Active Problems:   GAD (generalized anxiety disorder)    Vitamin D deficiency  Long Term Goal(s): Improvement in symptoms so as ready for discharge   Short Term Goals: Ability to verbalize feelings will improve Ability to disclose and discuss suicidal ideas Ability to identify and develop effective coping behaviors will improve Compliance with prescribed medications will improve     Medication Management: Evaluate patient's response, side effects, and tolerance of medication regimen.  Therapeutic Interventions: 1 to 1 sessions, Unit Group sessions and Medication administration.  Evaluation of Outcomes: Progressing   RN Treatment Plan for Primary Diagnosis: Schizoaffective disorder, depressive type (HCC) Long Term Goal(s): Knowledge of disease and therapeutic regimen to maintain health will improve  Short Term Goals: Ability to remain free from injury will improve, Ability to verbalize frustration and anger appropriately will improve, Ability to demonstrate self-control, Ability to participate in decision making will improve, Ability to verbalize feelings will improve, Ability to disclose and discuss suicidal ideas, Ability to identify and develop effective coping behaviors will improve, and Compliance with prescribed medications will improve  Medication Management: RN will administer medications as ordered by provider, will assess and evaluate patient's response and provide education to patient for prescribed medication. RN will report any adverse and/or side effects to prescribing provider.  Therapeutic Interventions: 1 on 1 counseling sessions, Psychoeducation, Medication administration, Evaluate responses to treatment, Monitor vital signs and CBGs as ordered, Perform/monitor CIWA, COWS, AIMS and Fall Risk screenings as ordered, Perform wound care treatments as ordered.  Evaluation of Outcomes: Progressing   LCSW Treatment Plan for Primary Diagnosis: Schizoaffective disorder, depressive type (HCC) Long Term Goal(s): Safe transition to  appropriate next level of care at discharge, Engage patient in therapeutic group addressing interpersonal concerns.  Short Term Goals: Engage patient in aftercare planning with referrals and resources, Increase social support, Increase ability to appropriately verbalize feelings, Increase emotional regulation, Facilitate acceptance of mental health diagnosis and concerns, Facilitate patient progression through stages of change regarding substance use diagnoses and concerns, Identify triggers associated with mental health/substance abuse issues, and Increase skills for wellness and recovery  Therapeutic Interventions: Assess for all discharge needs, 1 to 1 time with Social worker, Explore available resources and support systems, Assess for adequacy in community support network, Educate family and significant other(s) on suicide prevention, Complete Psychosocial Assessment, Interpersonal group therapy.  Evaluation of Outcomes: Progressing   Progress in Treatment: Attending groups: Yes. Participating in groups: Yes. Taking medication as prescribed: Yes. Toleration medication: Yes. Family/Significant other contact made: Yes, individual(s) contacted:  Alcie Runions [phone] (Mother) Patient understands diagnosis: Yes. Discussing patient identified problems/goals with staff: Yes. Medical problems stabilized or resolved: Yes. Denies suicidal/homicidal ideation: Yes. Issues/concerns per patient self-inventory: Yes. Other: none  New problem(s) identified: No, Describe:  none  New Short Term/Long Term Goal(s): Patient to work towards detox, elimination of symptoms of psychosis, medication management for mood stabilization; elimination of SI thoughts; development of comprehensive mental wellness/sobriety plan.  Patient Goals:  No additional goals identified at this time. Patient to continue to work towards original goals identified in initial treatment team meeting. CSW will remain available to  patient should they voice additional treatment goals.   Discharge Plan or Barriers: Patient lack  adequate disposition to discharge. Patient hs recently been awarded DSS guardian who requests patient remain in hospital until permanent guardianship hearing is completed and housing is secured.   Reason for Continuation of Hospitalization: Other; describe psychosis  Estimated Length of Stay: TBD  Last 3 [place] Suicide Severity Risk Score: Flowsheet Row Ad[facility] (Current) [facility] from [facility]18/2022 in [facility] Office Visit from 04/14/2022 in [facility]  C-SSRS RISK CATEGORY No Risk High Risk No Risk       Last PHQ 2/9 Scores:    04/14/2022   10:46 AM 02/24/2022   11:12 AM 01/13/2022    9:14 AM  Depression screen PHQ 2/9  Decreased Interest 0 0 0  Down, Depressed, Hopeless 0 0 0  PHQ - 2 Score 0 0 0    Scribe for Treatment Team: Almedia Balls 08/10/2022 11:50 AM

## 2022-08-10 NOTE — Group Note (Signed)
Recreation Therapy Group Note   Group Topic:Healthy Decision Making  Group Date: 08/10/2022 Start Time: 1000 End Time: 1040 Facilitators: Caroll Rancher, LRT,CTRS Location: 500 [address] Dayroom   Goal Area(s) Addresses:  Patient will effectively work with peer towards shared goal.  Patient will identify factors that guided their decision making.  Patient will pro-socially communicate ideas during group session.    Group Description: Patients were given a scenario that they were going to be stranded on a deserted [place] for several months before being rescued. Writer tasked them with making a list of 15 things they would choose to bring with them for "survival". The list of items was prioritized most important to least. Each patient would come up with their own list, then work together to create a new list of 15 items while in a group of 3-5 peers. LRT discussed each person's list and how it differed from others. The debrief included discussion of priorities, good decisions versus bad decisions, and how it is important to think before acting so we can make the best decision possible. LRT tied the concept of effective communication among group members to patient's support systems outside of the hospital and its benefit post discharge.   Affect/Mood: N/A   Participation Level: Did not attend    Clinical Observations/Individualized Feedback:     Plan: Continue to engage patient in RT group sessions 2-3x/week.   Caroll Rancher, LRT,CTRS 08/10/2022 11:41 AM

## 2022-08-10 NOTE — Plan of Care (Signed)

## 2022-08-10 NOTE — Group Note (Signed)
LCSW Group Therapy Note  Group Date: 08/10/2022 Start Time: 1300 End Time: 1400   Type of Therapy and Topic:  Group Therapy - Healthy vs Unhealthy Coping Skills  Participation Level:  Active   Description of Group The focus of this group was to determine what unhealthy coping techniques typically are used by group members and what healthy coping techniques would be helpful in coping with various problems. Patients were guided in becoming aware of the differences between healthy and unhealthy coping techniques. Patients were asked to identify 2-3 healthy coping skills they would like to learn to use more effectively.  Therapeutic Goals Patients learned that coping is what human beings do all day long to deal with various situations in their lives Patients defined and discussed healthy vs unhealthy coping techniques Patients identified their preferred coping techniques and identified whether these were healthy or unhealthy Patients determined 2-3 healthy coping skills they would like to become more familiar with and use more often. Patients provided support and ideas to each other   Summary of Patient Progress:   Patient was present for the entirety of the group session. Patient was an active listener and participated in the topic of discussion, provided helpful advice to others, and added nuance to topic of conversation. Patient shared that she is often stressed by going out in public. Provided the example of going to the grocery store then explained she can cope by creating a list prior to going.    Therapeutic Modalities Cognitive Behavioral Therapy Motivational Interviewing  Almedia Balls 08/10/2022  2:49 PM

## 2022-08-10 NOTE — BHH Counselor (Signed)
CSW spoke with Cheryl Adams at [facility] DSS [phone] (Office) and [phone] (Cell).  Mrs. Cheryl Adams has been assigned as the Interim Guardian for the Pt, Cheryl Adams.  Mrs. Cheryl Adams states that an MDE has been ordered for Cheryl Adams and that she will also begin the process for Medicaid and SSI.  Mrs. Cheryl Adams states that she will be coming to the hospital on 08/14/2022 to meet with the Pt.  CSW provided [name] with the CSW's contact information as well. Mrs. Cheryl Adams will email the CSW a copy of the [employer] paperwork.

## 2022-08-10 NOTE — BHH Group Notes (Signed)
Adult Psychoeducational Group Note  Date:  08/10/2022 Time:  9:11 AM  Group Topic/Focus:  Goals Group:   The focus of this group is to help patients establish daily goals to achieve during treatment and discuss how the patient can incorporate goal setting into their daily lives to aide in recovery.  Participation Level:  Did Not Attend   Cheryl Adams 08/10/2022, 9:11 AM
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
## 2022-08-11 DIAGNOSIS — F251 Schizoaffective disorder, depressive type: Secondary | ICD-10-CM | POA: Diagnosis not present

## 2022-08-11 NOTE — Plan of Care (Signed)
  Problem: Coping: Goal: Ability to verbalize frustrations and anger appropriately will improve Outcome: Progressing Goal: Ability to demonstrate self-control will improve Outcome: Progressing   Problem: Activity: Goal: Interest or engagement in activities will improve Outcome: Progressing Goal: Sleeping patterns will improve Outcome: Progressing   

## 2022-08-11 NOTE — Progress Notes (Signed)
   08/11/22 2100  Psych Admission Type (Psych Patients Only)  Admission Status Voluntary  Psychosocial Assessment  Patient Complaints None  Eye Contact Fair  Facial Expression Sad  Affect Appropriate to circumstance  Speech Soft  Interaction Cautious;Childlike  Motor Activity Slow  Appearance/Hygiene Disheveled  Behavior Characteristics Cooperative  Mood Suspicious;Sad;Preoccupied  Aggressive Behavior  Effect No apparent injury  Thought Process  Coherency Circumstantial  Content Blaming others  Delusions Paranoid  Perception WDL  Hallucination None reported or observed  Judgment Limited  Confusion None  Danger to Self  Current suicidal ideation? Denies  Danger to Others  Danger to Others None reported or observed

## 2022-08-11 NOTE — Progress Notes (Signed)
   08/11/22 0500  Sleep  Number of Hours 7

## 2022-08-11 NOTE — Progress Notes (Signed)
   08/11/22 0800  Psych Admission Type (Psych Patients Only)  Admission Status Voluntary  Psychosocial Assessment  Patient Complaints None  Eye Contact Fair  Facial Expression Blank  Affect Appropriate to circumstance  Speech Soft;Slow  Interaction Cautious;Childlike  Motor Activity Slow  Appearance/Hygiene Unremarkable  Behavior Characteristics Cooperative  Mood Preoccupied;Pleasant  Aggressive Behavior  Effect No apparent injury  Thought Process  Coherency Circumstantial  Content Blaming others  Delusions Paranoid  Perception WDL  Hallucination None reported or observed  Judgment Limited  Confusion None  Danger to Self  Current suicidal ideation? Denies  Self-Injurious Behavior No self-injurious ideation or behavior indicators observed or expressed   Agreement Not to Harm Self Yes  Description of Agreement Verbal contract  Danger to Others  Danger to Others None reported or observed

## 2022-08-11 NOTE — Progress Notes (Signed)
Adult Psychoeducational Group Note  Date:  08/11/2022 Time:  8:32 PM  Group Topic/Focus:  Wrap-Up Group:   The focus of this group is to help patients review their daily goal of treatment and discuss progress on daily workbooks.  Participation Level:  Active  Participation Quality:  Appropriate  Affect:  Appropriate  Cognitive:  Appropriate  Insight: Appropriate  Engagement in Group:  Improving  Modes of Intervention:  Discussion  Additional Comments:  Pt stated her goal for today was to focus on her treatment plan. Pt stated she accomplished her goal today. Pt stated she talked with her  doctor and with her social worker about her care today. Pt rated her overall day a 4 out of 10. Pt stated she really enjoyed going outside with her peers today. Pt stated she made no calls today. Pt stated she felt better about herself tonight. Pt stated she was able to attend all meals today. Pt stated she took all medications provided today. Pt stated her appetite was fair today. Pt rated her sleep last night was fair. Pt stated the goal tonight was to get some rest. Pt stated she had no physical pain tonight. Pt deny visual hallucinations and auditory issues tonight. Pt denies thoughts of harming herself or others. Pt stated she would alert staff if anything changed.    Felipa Furnace 08/11/2022, 8:32 PM
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
## 2022-08-12 DIAGNOSIS — F251 Schizoaffective disorder, depressive type: Secondary | ICD-10-CM | POA: Diagnosis not present

## 2022-08-12 NOTE — Group Note (Signed)
Recreation Therapy Group Note   Group Topic:Leisure Education  Group Date: 08/12/2022 Start Time: 1010 End Time: 1050 Facilitators: Caroll Rancher, LRT,CTRS Location: [address] Dayroom   Goal Area(s) Addresses:  Patient will successfully identify benefits of leisure participation. Patient will successfully identify ways to access leisure activities.  Patient will verbalize positive recreational outlets to use post d/c.  Group Description: Keep It Going Volleyball. Patients were seated in chairs in a circle in the dayroom. LRT explained the rules to the game to the patients.  The patients were to hit the beach ball to each other without letting the ball come to a stop.  LRT would count the number of hits the patients got on the ball before it stopped.  Each time the ball stopped, the count would start over.    Affect/Mood: Appropriate   Participation Level: Engaged   Participation Quality: Minimal Cues   Behavior: Appropriate   Speech/Thought Process: Focused   Insight: Moderate   Judgement: Moderate   Modes of Intervention: Cooperative Play   Patient Response to Interventions:  Engaged   Education Outcome:  Acknowledges education and In group clarification offered    Clinical Observations/Individualized Feedback: Pt was engaged throughout activity.  Pt had difficulty playing along with the rules. Pt would catch the ball at times stopping the count.  Pt had to be reminded a few times not to catch the ball as it would start the count over.  Pt got better as the game went on and was able to follow the rules of the game.    Plan: Continue to engage patient in RT group sessions 2-3x/week.   Caroll Rancher, LRT,CTRS 08/12/2022 1:46 PM

## 2022-08-12 NOTE — Progress Notes (Signed)
   08/12/22 2058  Psych Admission Type (Psych Patients Only)  Admission Status Voluntary  Psychosocial Assessment  Patient Complaints None  Eye Contact Brief  Facial Expression Flat  Affect Appropriate to circumstance  Speech Slow  Interaction Childlike  Motor Activity Slow  Appearance/Hygiene Disheveled  Behavior Characteristics Cooperative  Mood Pleasant  Thought Process  Coherency Circumstantial  Content WDL  Delusions None reported or observed  Perception WDL  Hallucination None reported or observed  Judgment Limited  Confusion None  Danger to Self  Current suicidal ideation? Denies  Danger to Others  Danger to Others None reported or observed   Progress note   D: Pt seen at nurse's station. Pt denies SI, HI, AVH. Pt rates pain  0/10. Pt rates anxiety  0/10 and depression  0/10. Pt has flat affect. Pt attended group this evening. Pt still needs prompting to attend to ADLs. Pt denies any other complaints at this time.  A: Pt provided support and encouragement. Pt given scheduled medication as prescribed. PRNs as appropriate. Q15 min checks for safety.   R: Pt safe on the unit. Will continue to monitor.

## 2022-08-12 NOTE — Progress Notes (Signed)
   08/12/22 1200  Psych Admission Type (Psych Patients Only)  Admission Status Voluntary  Psychosocial Assessment  Patient Complaints None  Eye Contact Fair  Facial Expression Blank  Affect Appropriate to circumstance  Speech Soft;Slow  Interaction Cautious;Childlike  Motor Activity Slow  Appearance/Hygiene Disheveled  Behavior Characteristics Cooperative  Mood Preoccupied;Suspicious;Sad  Aggressive Behavior  Effect No apparent injury  Thought Process  Coherency Circumstantial  Content Blaming others  Delusions Paranoid  Perception WDL  Hallucination None reported or observed  Judgment Limited  Confusion None  Danger to Self  Current suicidal ideation? Denies  Danger to Others  Danger to Others None reported or observed

## 2022-08-12 NOTE — [facility] Group Notes (Signed)
Adult Psychoeducational Group Note  Date:  08/12/2022 Time:  9:47 AM  Group Topic/Focus:  Goals Group:   The focus of this group is to help patients establish daily goals to achieve during treatment and discuss how the patient can incorporate goal setting into their daily lives to aide in recovery.  Participation Level:  Active  Participation Quality:  Appropriate  Affect:  Appropriate  Cognitive:  Appropriate  Insight: Appropriate  Engagement in Group:  Engaged  Modes of Intervention:  Discussion   Cheryl Adams 08/12/2022, 9:47 AM

## 2022-08-12 NOTE — Progress Notes (Signed)
   08/12/22 0530  Sleep  Number of Hours 7
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
## 2022-08-13 DIAGNOSIS — F251 Schizoaffective disorder, depressive type: Secondary | ICD-10-CM | POA: Diagnosis not present
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
## 2022-08-13 NOTE — Progress Notes (Signed)
Date:  08/13/2022 Time:  9:00 AM   Group Topic/Focus:    Goals Group:   The focus of this group is to help patients establish daily goals to achieve during treatment and discuss how the patient can incorporate goal setting into their daily lives to aide in recovery.   Orientation:   The focus of this group is to educate the patient on the purpose and policies of crisis stabilization and provide a format to answer questions about their admission.  The group details unit policies and expectations of patients while admitted.    Participation Level:  Active   Participation Quality:  Appropriate   Affect:  Appropriate   Cognitive:  Appropriate   Insight: Appropriate   Engagement in Group:  Engaged   Modes of Intervention:  Discussion   Additional Comments:  N/A   Laney Louderback  RN 08/13/2022 9:30AM         

## 2022-08-14 ENCOUNTER — Encounter (HOSPITAL_COMMUNITY): Payer: Self-pay
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
## 2022-08-14 NOTE — Progress Notes (Signed)
   08/14/22 2200  Psych Admission Type (Psych Patients Only)  Admission Status Voluntary  Psychosocial Assessment  Patient Complaints None  Eye Contact Brief  Facial Expression Flat  Affect Appropriate to circumstance  Speech Logical/coherent  Interaction Childlike  Motor Activity Slow  Appearance/Hygiene Disheveled  Behavior Characteristics Cooperative  Mood Preoccupied  Thought Process  Coherency Circumstantial  Content WDL  Delusions None reported or observed  Perception WDL  Hallucination None reported or observed  Judgment Limited  Confusion None  Danger to Self  Current suicidal ideation? Denies  Self-Injurious Behavior No self-injurious ideation or behavior indicators observed or expressed   Agreement Not to Harm Self Yes  Description of Agreement verbal  Danger to Others  Danger to Others None reported or observed

## 2022-08-14 NOTE — Progress Notes (Signed)
   08/13/22 2300  Psych Admission Type (Psych Patients Only)  Admission Status Voluntary  Psychosocial Assessment  Patient Complaints None  Eye Contact Brief  Facial Expression Fixed smile  Affect Appropriate to circumstance  Speech Slow  Interaction Childlike  Motor Activity Slow  Appearance/Hygiene Unremarkable  Behavior Characteristics Cooperative;Appropriate to situation  Mood Preoccupied  Thought Process  Coherency Circumstantial  Content WDL  Delusions None reported or observed  Perception WDL  Hallucination None reported or observed  Judgment Poor  Confusion None  Danger to Self  Current suicidal ideation? Denies  Self-Injurious Behavior No self-injurious ideation or behavior indicators observed or expressed   Agreement Not to Harm Self Yes  Danger to Others  Danger to Others None reported or observed

## 2022-08-14 NOTE — BHH Group Notes (Signed)
Adult Psychoeducational Group Note  Date:  08/14/2022 Time:  10:07 AM  Group Topic/Focus:  Goals Group:   The focus of this group is to help patients establish daily goals to achieve during treatment and discuss how the patient can incorporate goal setting into their daily lives to aide in recovery.  Participation Level:  Active  Participation Quality:  Appropriate  Affect:  Appropriate  Cognitive:  Appropriate  Insight: Appropriate  Engagement in Group:  Engaged  Modes of Intervention:  Education  Additional Comments:  Pt goal today is to communicate with doctors and counselor. Pt has no feelings of wanting to hurt herself or others.  Eyan Hagood, Sharen Counter 08/14/2022, 10:07 AM

## 2022-08-14 NOTE — Plan of Care (Signed)
Patient is alert and oriented x4. She verbalizes she is enjoying her room change and being on a different hallway. She denies SI/HI/AVH. She remains guarded and forwards little information, but she has been communicating with her peers some in the milleu. Will continue pt to attend groups and participate in unit activities.    Problem: Education: Goal: Knowledge of [facility] Education information/materials will improve Outcome: Progressing Goal: Emotional status will improve Outcome: Progressing Goal: Mental status will improve Outcome: Progressing Goal: Verbalization of understanding the information provided will improve Outcome: Progressing   Problem: Activity: Goal: Interest or engagement in activities will improve Outcome: Progressing Goal: Sleeping patterns will improve Outcome: Progressing   Problem: Coping: Goal: Ability to verbalize frustrations and anger appropriately will improve Outcome: Progressing Goal: Ability to demonstrate self-control will improve Outcome: Progressing

## 2022-08-14 NOTE — BHH Counselor (Signed)
CSW spoke with the [name] Guardian Ad Litem, Gari Crown [phone] who states that he had contacted [facility] to have the MDE completed.  The states that [facility] informed him that "since 3 doctors have to be involved in the testing they state they cannot come to the hospital to complete it".  Mr. Senaida Ores asked CSW if the Pt could leave the hospital to go to [facility] and then return to the hospital after it was competed.  CSW stated that it may be against hospital policy for the Pt to leave and return if it is not to another [facility] facility for health reasons.  Mr. Senaida Ores then asked if the DSS Guardian could take the Pt to the MDE.  CSW explained that it would still go against the same policy regardless of who was transporting the Pt due to hospital insurance and liability.  Mr. Senaida Ores then asked if the MDE could be completed virtually.  He states that this would take "several hours".  CSW informed Mr. Senaida Ores that the hospital does not have a doctor, nurse, or social worker that can sit with the Pt for several hours while she is using the computer.  Mr. Senaida Ores then asked if the DSS Guardian could sit with the Pt during the virtual MDE.  CSW stated that this could be done if the DSS Guardian agrees to be at the hospital for several hours and sits with the Pt the entire time.  Mr. Senaida Ores states that he will speak with the DSS Guardian about this.  CSW stated that she will also speak with her supervisor and other Administration staff about the possibility of the Pt being able to leave the hospital for the day to complete the MDE at [facility].  CSW will speak with Mr. Senaida Ores next week about this matter and what information CSW receives from her supervisor and Museum/gallery curator.

## 2022-08-14 NOTE — BHH Counselor (Signed)
CSW and the Pt met with Devoria Albe, DSS Guardian.  Mrs. Ouida Sills introduced herself to the Pt and asked her some basic questions such as her name, what she would like to do after hospitalization, how long she has been in the hospital, and her birth date.  When asked about her family, the Pt chose not to talk about them and asked if those questions could be skipped.  The Pt stated that she did not have any questions for [name] at this time.  After meeting with the Pt, Mrs. Ouida Sills spoke with the CSW and states that the Medicaid application was put in on 08/14/2022 and is pending approval through DSS.  She states that DSS will be finding the group home placement for the Pt but will need an FL2 from the CSW.  CSW stated that she would have the FL2 completed next week and will fax it to [name] at [phone].  Mrs. Ouida Sills states that the [name] mother is working with her to complete an SSI application and has been providing information about the [name] life prior to her current hospitalization.  CSW spoke with Mrs. Ouida Sills about the possibility of her coming to the hospital to complete the MDE virtually with the Pt.  Mrs. Ouida Sills states that she can be available for that if necessary.  CSW will follow up with [name] once more information is gathered and the [facility] is completed.

## 2022-08-14 NOTE — Group Note (Signed)
Recreation Therapy Group Note   Group Topic:Leisure Education  Group Date: 08/14/2022 Start Time: 0930 End Time: 1000 Facilitators: Caroll Rancher, LRT,CTRS Location: 300 Hall Dayroom   Goal Area(s) Addresses:  Patient will successfully identify positive leisure and recreation activities.  Patient will acknowledge benefits of participation in healthy leisure activities post discharge.  Patient will actively work with peers toward a shared goal.   Group Description: Pictionary. In groups of 5-7, patients took turns trying to guess the picture being drawn on the board by their teammate.  If the team guessed the correct answer, they won a point.  If the team guessed wrong, the other team got a chance to steal the point. After several rounds of game play, the team with the most points were declared winners. Post-activity discussion reviewed benefits of positive recreation outlets: reducing stress, improving coping mechanisms, increasing self-esteem, and building larger support systems.   Affect/Mood: Appropriate   Participation Level: Engaged   Participation Quality: Independent   Behavior: Appropriate   Speech/Thought Process: Focused   Insight: Good   Judgement: Good   Modes of Intervention: Competitive Play   Patient Response to Interventions:  Engaged   Education Outcome:  Acknowledges education and In group clarification offered    Clinical Observations/Individualized Feedback: Pt was quiet but engaged.  Pt had some interaction with peers and was appropriate throughout.    Plan: Continue to engage patient in RT group sessions 2-3x/week.   Caroll Rancher, Antonietta Jewel 08/14/2022 12:45 PM

## 2022-08-14 NOTE — BH IP Treatment Plan (Signed)
Interdisciplinary Treatment and Diagnostic Plan Update  08/14/2022 Time of Session: 10:00am  Cheryl Adams MRN: [id number]  Principal Diagnosis: Schizoaffective disorder, depressive type [facility])  Secondary Diagnoses: Principal Problem:   Schizoaffective disorder, depressive type (HCC) Active Problems:   GAD (generalized anxiety disorder)   Vitamin D deficiency   Current Medications:  Current Facility-Administered Medications  Medication Dose Route Frequency Provider Last Rate Last Admin   acetaminophen (TYLENOL) tablet 500 mg  500 mg Oral Q6H PRN Massengill, Harrold Donath, MD       amLODipine (NORVASC) tablet 10 mg  10 mg Oral Daily Massengill, Harrold Donath, MD   10 mg at 08/14/22 0805   antiseptic oral rinse (BIOTENE) solution 15 mL  15 mL Mouth Rinse 5 X Daily PRN Comer Locket, MD       atenolol (TENORMIN) tablet 25 mg  25 mg Oral Daily Massengill, Harrold Donath, MD   25 mg at 08/14/22 0805   cloZAPine (CLOZARIL) tablet 300 mg  300 mg Oral QHS Massengill, Harrold Donath, MD   300 mg at 08/13/22 2141   docusate sodium (COLACE) capsule 100 mg  100 mg Oral BID Massengill, Harrold Donath, MD   100 mg at 08/14/22 0805   ferrous sulfate tablet 325 mg  325 mg Oral Daily Princess Bruins, DO   325 mg at 08/14/22 0805   hydrOXYzine (ATARAX) tablet 25 mg  25 mg Oral TID PRN Phineas Inches, MD   25 mg at 06/17/22 2027   OLANZapine zydis (ZYPREXA) disintegrating tablet 5 mg  5 mg Oral Q8H PRN Massengill, Harrold Donath, MD   5 mg at 05/24/22 2154   And   LORazepam (ATIVAN) tablet 1 mg  1 mg Oral Q8H PRN Massengill, Harrold Donath, MD   1 mg at 07/03/22 2150   And   ziprasidone (GEODON) injection 20 mg  20 mg Intramuscular Q8H PRN Massengill, Harrold Donath, MD       metFORMIN (GLUCOPHAGE-XR) 24 hr tablet 500 mg  500 mg Oral Q breakfast Massengill, Nathan, MD   500 mg at 08/14/22 0805   methylphenidate (RITALIN) tablet 10 mg  10 mg Oral Daily Hill, Shelbie Hutching, MD   10 mg at 08/14/22 0805   polyethylene glycol (MIRALAX / GLYCOLAX) packet 17 g  17 g  Oral Daily Massengill, Harrold Donath, MD   17 g at 08/14/22 0804   sertraline (ZOLOFT) tablet 100 mg  100 mg Oral Daily Massengill, Harrold Donath, MD   100 mg at 08/14/22 0805   vitamin D3 (CHOLECALCIFEROL) tablet 1,000 Units  1,000 Units Oral Daily Massengill, Harrold Donath, MD   1,000 Units at 08/14/22 0805   PTA Medications: Medications Prior to Admission  Medication Sig Dispense Refill Last Dose   ferrous sulfate 325 (65 FE) MG tablet Take 325 mg by mouth daily.      risperiDONE (RISPERDAL) 1 MG tablet Take 1 tablet (1 mg total) by mouth daily.      risperiDONE (RISPERDAL) 2 MG tablet Take 1 tablet (2 mg total) by mouth at bedtime.       Patient Stressors: Health problems   Marital or family conflict   Medication change or noncompliance    Patient Strengths: Average or above average intelligence  Supportive family/friends   Treatment Modalities: Medication Management, Group therapy, Case management,  1 to 1 session with clinician, Psychoeducation, Recreational therapy.   Physician Treatment Plan for Primary Diagnosis: Schizoaffective disorder, depressive type (HCC) Long Term Goal(s): Improvement in symptoms so as ready for discharge   Short Term Goals: Ability to verbalize feelings will  improve Ability to disclose and discuss suicidal ideas Ability to identify and develop effective coping behaviors will improve Compliance with prescribed medications will improve  Medication Management: Evaluate patient's response, side effects, and tolerance of medication regimen.  Therapeutic Interventions: 1 to 1 sessions, Unit Group sessions and Medication administration.  Evaluation of Outcomes: Progressing  Physician Treatment Plan for Secondary Diagnosis: Principal Problem:   Schizoaffective disorder, depressive type (HCC) Active Problems:   GAD (generalized anxiety disorder)   Vitamin D deficiency  Long Term Goal(s): Improvement in symptoms so as ready for discharge   Short Term Goals: Ability to  verbalize feelings will improve Ability to disclose and discuss suicidal ideas Ability to identify and develop effective coping behaviors will improve Compliance with prescribed medications will improve     Medication Management: Evaluate patient's response, side effects, and tolerance of medication regimen.  Therapeutic Interventions: 1 to 1 sessions, Unit Group sessions and Medication administration.  Evaluation of Outcomes: Progressing   RN Treatment Plan for Primary Diagnosis: Schizoaffective disorder, depressive type (HCC) Long Term Goal(s): Knowledge of disease and therapeutic regimen to maintain health will improve  Short Term Goals: Ability to remain free from injury will improve, Ability to participate in decision making will improve, Ability to verbalize feelings will improve, Ability to disclose and discuss suicidal ideas, and Ability to identify and develop effective coping behaviors will improve  Medication Management: RN will administer medications as ordered by provider, will assess and evaluate patient's response and provide education to patient for prescribed medication. RN will report any adverse and/or side effects to prescribing provider.  Therapeutic Interventions: 1 on 1 counseling sessions, Psychoeducation, Medication administration, Evaluate responses to treatment, Monitor vital signs and CBGs as ordered, Perform/monitor CIWA, COWS, AIMS and Fall Risk screenings as ordered, Perform wound care treatments as ordered.  Evaluation of Outcomes: Progressing   LCSW Treatment Plan for Primary Diagnosis: Schizoaffective disorder, depressive type (HCC) Long Term Goal(s): Safe transition to appropriate next level of care at discharge, Engage patient in therapeutic group addressing interpersonal concerns.  Short Term Goals: Engage patient in aftercare planning with referrals and resources, Increase social support, Increase emotional regulation, Facilitate acceptance of mental  health diagnosis and concerns, Identify triggers associated with mental health/substance abuse issues, and Increase skills for wellness and recovery  Therapeutic Interventions: Assess for all discharge needs, 1 to 1 time with Social worker, Explore available resources and support systems, Assess for adequacy in community support network, Educate family and significant other(s) on suicide prevention, Complete Psychosocial Assessment, Interpersonal group therapy.  Evaluation of Outcomes: Progressing   Progress in Treatment: Attending groups: Yes. Participating in groups: Yes. Taking medication as prescribed: Yes. Toleration medication: Yes. Family/Significant other contact made: Yes, individual(s) contacted:  Evelyn Aguinaldo [phone] (Mother) Patient understands diagnosis: Yes. Discussing patient identified problems/goals with staff: Yes. Medical problems stabilized or resolved: Yes. Denies suicidal/homicidal ideation: Yes. Issues/concerns per patient self-inventory: Yes. Other: none   New problem(s) identified: No, Describe:  none   New Short Term/Long Term Goal(s): Patient to work towards detox, elimination of symptoms of psychosis, medication management for mood stabilization; elimination of SI thoughts; development of comprehensive mental wellness/sobriety plan.   Patient Goals:  No additional goals identified at this time. Patient to continue to work towards original goals identified in initial treatment team meeting. CSW will remain available to patient should they voice additional treatment goals.    Discharge Plan or Barriers: Patient lack  adequate disposition to discharge. Patient hs recently been awarded DSS guardian  who requests patient remain in hospital until permanent guardianship hearing is completed and housing is secured.    Reason for Continuation of Hospitalization: Other; describe psychosis   Estimated Length of Stay: TBD    Last 3 [place] Suicide Severity  Risk Score: Flowsheet Row [facility] ([facility]18[facility]2022 in [facility] Visit from 04/14/2022 in [facility]  C-SSRS RISK CATEGORY No Risk High Risk No Risk       Last PHQ 2/9 Scores:    04/14/2022   10:46 AM 02/24/2022   11:12 AM 01/13/2022    9:14 AM  Depression screen PHQ 2/9  Decreased Interest 0 0 0  Down, Depressed, Hopeless 0 0 0  PHQ - 2 Score 0 0 0    Scribe for Treatment Team: Aram Beecham, Theresia Majors 08/14/2022 8:52 AM

## 2022-08-14 NOTE — Group Note (Signed)
LCSW Group Therapy Note  Group Date: 08/14/2022 Start Time: 1300 End Time: 1400   Type of Therapy and Topic:  Group Therapy - How To Cope with Nervousness about Discharge   Participation Level:  Minimal   Description of Group This process group involved identification of patients' feelings about discharge. Some of them are scheduled to be discharged soon, while others are new admissions, but each of them was asked to share thoughts and feelings surrounding discharge from the hospital. One common theme was that they are excited at the prospect of going home, while another was that many of them are apprehensive about sharing why they were hospitalized. Patients were given the opportunity to discuss these feelings with their peers in preparation for discharge.  Therapeutic Goals  Patient will identify their overall feelings about pending discharge. Patient will think about how they might proactively address issues that they believe will once again arise once they get home (i.e. with parents). Patients will participate in discussion about having hope for change.   Summary of Patient Progress:  Cheryl Adams was very active throughout the session. Cheryl Adams demonstrated appropriate insight into the subject matter, and proved open to input from peers and feedback from CSW. Cheryl Adams was respectful of peers and participated throughout the entire session.   Therapeutic Modalities Cognitive Behavioral Therapy   Ane Payment, LCSW 08/14/2022  3:02 PM
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
## 2022-08-15 NOTE — Progress Notes (Signed)
   08/15/22 2234  Psych Admission Type (Psych Patients Only)  Admission Status Voluntary  Psychosocial Assessment  Patient Complaints None  Eye Contact Brief  Facial Expression Flat  Affect Appropriate to circumstance  Speech Logical/coherent  Interaction Childlike  Motor Activity Slow  Appearance/Hygiene In hospital gown  Behavior Characteristics Appropriate to situation  Mood Pleasant  Thought Process  Coherency Circumstantial  Content WDL  Delusions None reported or observed  Perception WDL  Hallucination None reported or observed  Judgment Limited  Confusion None  Danger to Self  Current suicidal ideation? Denies  Self-Injurious Behavior No self-injurious ideation or behavior indicators observed or expressed   Agreement Not to Harm Self Yes  Description of Agreement verbal  Danger to Others  Danger to Others None reported or observed

## 2022-08-15 NOTE — Group Note (Signed)
LCSW Group Therapy Note  08/15/2022      Type of Therapy and Topic:  Group Therapy: Gratitude   Description:   Group could not be held by social worker, but licensed RN did provide group.  A handout was given to each patient, with the following information:   Gratitude  "Acknowledging the good that you already have in your life is the foundation for all abundance." - Eckhart Tolle  " 'Enough' is a feast." - Buddhist Proverb  "Gratitude sweetens even the smallest moments."  "It is not joy that makes us grateful; It is gratitude that makes us joyful." - David Steindl-Rast    Put at least one response under each category of something for which you are grateful:  People:  Experiences:  Things:  Places:  Skills:  Other:  Add more responses as you get ideas from other people.   Therapeutic Modalities:   Activity  Sai Zinn J Grossman-Orr, LCSW .  

## 2022-08-15 NOTE — [facility] Group Notes (Signed)
.  Psychoeducational Group Note    Date:  08/15/2022 Time: 1300-1400    Purpose of Group: . The group focus' on teaching patients on how to identify their needs and their Life Skills:  A group where two lists are made. What people need and what are things that we do that are unhealthy. The lists are developed by the patients and it is explained that we often do the actions that are not healthy to get our list of needs met.  Goal:: to develop the coping skills needed to get their needs met  Participation Level:  Active  Participation Quality:  Appropriate  Affect:  Appropriate  Cognitive:  Oriented  Insight: Improving  Engagement in Group:  Engaged  Modes of Intervention:  Activity, Discussion, Education, and Support  Additional Comments:  Rates her energy at a 4/10. Did participated in the group  Bryson Dames A

## 2022-08-15 NOTE — Progress Notes (Signed)
Adult Psychoeducational Group Note  Date:  08/15/2022 Time:  9:02 PM  Group Topic/Focus:  Wrap-Up Group:   The focus of this group is to help patients review their daily goal of treatment and discuss progress on daily workbooks.  Participation Level:  Active  Participation Quality:  Appropriate  Affect:  Appropriate  Cognitive:  Appropriate  Insight: Appropriate  Engagement in Group:  Engaged  Modes of Intervention:  Discussion  Additional Comments:  Cheryl Adams said her day was a   Cheryl Adams said her day was a 70. Her goal try to find housing and she did not achieved her goal. The one work describe her day average 08/15/2022, 9:02 PM

## 2022-08-16 NOTE — Progress Notes (Signed)
Adult Psychoeducational Group Note  Date:  08/16/2022 Time:  9:38 PM  Group Topic/Focus:  Wrap-Up Group:   The focus of this group is to help patients review their daily goal of treatment and discuss progress on daily workbooks.  Participation Level:  Active  Participation Quality:  Appropriate  Affect:  Appropriate  Cognitive:  Appropriate  Insight: Appropriate  Engagement in Group:  Engaged  Modes of Intervention:  Discussion  Additional Comments:   Pt states that she had a good day due to the fact she was able to go outside and meditate today. Pt states that her goal was to open up and communicate more and she was belt to do that today.   [name] 08/16/2022, 9:38 PM
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
## 2022-08-16 NOTE — Group Note (Signed)
LCSW Group Therapy Note  08/16/2022      Type of Therapy and Topic:  Group Therapy: Positive Affirmations   Description:   Group could not be held by social worker, but licensed RN did provide group.    CSW provided a handout for each patient, with the following information:   Positive Affirmation Worksheet  Programming your subconscious by repeating positive statements with focus, intention  and belief is a technique called positive affirmations. This Worksheet will walk you  through the process of creating your own positive affirmations.   Releasing Negative Feelings  It is believed that this process is more effective when incorporating and understanding the negative  feelings, or mental programs that you harbor within your subconscious regarding yourself. This first part  will help you identify your own negative beliefs. When you shine your conscious light on your negative  beliefs and understand that they are merely beliefs and not based on reality, you can then utilize your  positive affirmations to overcome such beliefs and focus the rudder of your own life. Write as many negative beliefs down as you feel apply to your feelings about yourself.  Use the following prompts as a guide: I never. Nobody else. I'm the only one that. I am not. I don't. I don't want. I hate. I can't. Identifying Wants  Now, considering each of the negative feelings that you wrote down about yourself, write  a list of what you really want or deserve being very specific.   Creating Affirmations  From the previous exercise, distill your wants to a list of your desires. Write down a list of  your positive affirmations. Use the following prompts as a guide: I am. I welcome. I deserve. I choose. I believe. I trust. I have. I know. I feel. I create. I LOVE. One of the most powerful affirmations starts with I am.  I am.  Vocalize Affirmations Daily  Now that you have your list of  affirmations repeat them out loud to yourself several times each  day. When you say them, focus on their meanings and visualize your life as though your  affirmations have already become reality.    Therapeutic Modalities:   Activity  Denarius Sesler J Grossman-Orr, LCSW .  

## 2022-08-16 NOTE — [facility] Group Notes (Signed)
Adult Psychoeducational Group Note Date:  08/16/2022 Time:  0900-1045 Group Topic/Focus: PROGRESSIVE RELAXATION. A group where deep breathing is taught and tensing and relaxation muscle groups is used. Imagery is used as well.  Pts are asked to imagine 3 pillars that hold them up when they are not able to hold themselves up and to share that with the group.  Participation Level:  Active  Participation Quality:  Appropriate  Affect:  Appropriate  Cognitive:  Oriented  Insight: Improving  Engagement in Group:  Engaged  Modes of Intervention:  Activity, Discussion, Education, and Support  Additional Comments:  Rates her energy at a 4/10. States Music, herself and her counselors hold her up.  Paulino Rily

## 2022-08-16 NOTE — Progress Notes (Signed)
D) Pt received calm, visible, participating in milieu, and in no acute distress. Pt A & O x4. Pt denies SI, HI, A/ V H, depression, anxiety and pain at this time. A) Pt encouraged to drink fluids. Pt encouraged to come to staff with needs. Pt encouraged to attend and participate in groups. Pt encouraged to set reachable goals.  R) Pt remained safe on unit, in no acute distress, will continue to assess.      08/16/22 1930  Psych Admission Type (Psych Patients Only)  Admission Status Voluntary  Psychosocial Assessment  Patient Complaints None  Eye Contact Brief  Facial Expression Flat  Affect Appropriate to circumstance  Speech Logical/coherent  Interaction Cautious  Motor Activity Slow  Appearance/Hygiene Improved  Behavior Characteristics Cooperative  Mood Pleasant  Thought Process  Coherency Circumstantial  Content WDL  Delusions None reported or observed  Perception WDL  Hallucination None reported or observed  Judgment Limited  Confusion None  Danger to Self  Current suicidal ideation? Denies  Self-Injurious Behavior No self-injurious ideation or behavior indicators observed or expressed   Agreement Not to Harm Self Yes  Description of Agreement verbal  Danger to Others  Danger to Others None reported or observed

## 2022-08-16 NOTE — [facility] Group Notes (Signed)
Adult Psychoeducational Group  Date:  08/16/2022 Time:  1300-1400  Group Topic/Focus: Continuation of the group from Saturday. Looking at the lists that were created and talking about what needs to be done with the homework of 30 positives about themselves.                                     Talking about taking their power back and helping themselves to develop a positive self esteem.      Participation Quality:  Appropriate  Affect:  Appropriate  Cognitive:  Oriented  Insight: Improving  Engagement in Group:  Engaged  Modes of Intervention:  Activity, Discussion, Education, and Support  Additional Comments:  Rates her energy at a 4/10. Pays close attention to all that is going on in the room.  Paulino Rily

## 2022-08-16 NOTE — Group Note (Deleted)
LCSW Group Therapy Note  Group Date: 08/16/2022 Start Time: 1000 End Time: 1001   Type of Therapy and Topic:  Group Therapy: Positive Affirmations  Participation Level:  {[facility] PARTICIPATION LEVEL:22264}   Description of Group:   This group addressed positive affirmation towards self and others.  Patients went around the room and identified two positive things about themselves and two positive things about a peer in the room.  Patients reflected on how it felt to share something positive with others, to identify positive things about themselves, and to hear positive things from others/ Patients were encouraged to have a daily reflection of positive characteristics or circumstances.   Therapeutic Goals: 1. Patients will verbalize two of their positive qualities 2. Patients will demonstrate empathy for others by stating two positive qualities about a peer in the group 3. Patients will verbalize their feelings when voicing positive self affirmations and when voicing positive affirmations of others 4. Patients will discuss the potential positive impact on their wellness/recovery of focusing on positive traits of self and others.  Summary of Patient Progress:  *** actively engaged in the discussion and . S*** was able***or not able to identify positive affirmations about ***self as well as other group members. Patient demonstrated *** insight into the subject matter, was respectful of peers, participated throughout the entire session.  Therapeutic Modalities:   Cognitive Behavioral Therapy Motivational Interviewing    Lucresha Dismuke J Grossman-Orr, LCSWA 08/16/2022  8:49 AM    

## 2022-08-17 DIAGNOSIS — F251 Schizoaffective disorder, depressive type: Secondary | ICD-10-CM | POA: Diagnosis not present

## 2022-08-17 LAB — CBC WITH DIFFERENTIAL/PLATELET
Abs Immature Granulocytes: 0.06 10*3/uL (ref 0.00–0.07)
Abs Immature Granulocytes: 0.06 10*3/uL (ref 0.00–0.07)
Basophils Absolute: 0 10*3/uL (ref 0.0–0.1)
Basophils Absolute: 0.1 10*3/uL (ref 0.0–0.1)
Basophils Relative: 1 %
Basophils Relative: 1 %
Eosinophils Absolute: 0.2 10*3/uL (ref 0.0–0.5)
Eosinophils Absolute: 0.3 10*3/uL (ref 0.0–0.5)
Eosinophils Relative: 4 %
Eosinophils Relative: 4 %
HCT: 40.4 % (ref 36.0–46.0)
HCT: 40.8 % (ref 36.0–46.0)
Hemoglobin: 12.6 g/dL (ref 12.0–15.0)
Hemoglobin: 12.8 g/dL (ref 12.0–15.0)
Immature Granulocytes: 1 %
Immature Granulocytes: 1 %
Lymphocytes Relative: 29 %
Lymphocytes Relative: 29 %
Lymphs Abs: 1.7 10*3/uL (ref 0.7–4.0)
Lymphs Abs: 1.8 10*3/uL (ref 0.7–4.0)
MCH: 26.3 pg (ref 26.0–34.0)
MCH: 26.6 pg (ref 26.0–34.0)
MCHC: 31.2 g/dL (ref 30.0–36.0)
MCHC: 31.4 g/dL (ref 30.0–36.0)
MCV: 84.2 fL (ref 80.0–100.0)
MCV: 84.6 fL (ref 80.0–100.0)
Monocytes Absolute: 0.4 10*3/uL (ref 0.1–1.0)
Monocytes Absolute: 0.4 10*3/uL (ref 0.1–1.0)
Monocytes Relative: 6 %
Monocytes Relative: 6 %
Neutro Abs: 3.5 10*3/uL (ref 1.7–7.7)
Neutro Abs: 3.7 10*3/uL (ref 1.7–7.7)
Neutrophils Relative %: 59 %
Neutrophils Relative %: 59 %
Platelets: 286 10*3/uL (ref 150–400)
Platelets: 292 10*3/uL (ref 150–400)
RBC: 4.8 MIL/uL (ref 3.87–5.11)
RBC: 4.82 MIL/uL (ref 3.87–5.11)
RDW: 14.7 % (ref 11.5–15.5)
RDW: 14.7 % (ref 11.5–15.5)
WBC: 6 10*3/uL (ref 4.0–10.5)
WBC: 6.2 10*3/uL (ref 4.0–10.5)
nRBC: 0 % (ref 0.0–0.2)
nRBC: 0 % (ref 0.0–0.2)

## 2022-08-17 NOTE — Group Note (Signed)
Occupational Therapy Group Note  Group Topic:Coping Skills  Group Date: 08/17/2022 Start Time: 1400 End Time: 1445 Facilitators: Brantley Stage, OT   Group Description: Group encouraged increased engagement and participation through discussion and activity focused on "Coping Ahead." Patients were split up into teams and selected a card from a stack of positive coping strategies. Patients were instructed to act out/charade the coping skill for other peers to guess and receive points for their team. Discussion followed with a focus on identifying additional positive coping strategies and patients shared how they were going to cope ahead over the weekend while continuing hospitalization stay.  Therapeutic Goal(s): Identify positive vs negative coping strategies. Identify coping skills to be used during hospitalization vs coping skills outside of hospital/at home Increase participation in therapeutic group environment and promote engagement in treatment   Participation Level: Minimal   Participation Quality: Minimal Cues   Behavior: Calm and Cooperative   Speech/Thought Process: Barely audible   Affect/Mood: Appropriate   Insight: Fair   Judgement: Fair   Individualization: pt was passively engaged in their participation of group discussion/activity. New skills were identified  Modes of Intervention: Discussion and Education  Patient Response to Interventions:  Attentive   Plan: Continue to engage patient in OT groups 2 - 3x/week.  08/17/2022  Brantley Stage, OT Cornell Barman, OT

## 2022-08-17 NOTE — Progress Notes (Signed)
The patient attended the evening A.A. speaker's meeting.  

## 2022-08-17 NOTE — Progress Notes (Signed)
Pt medication compliant, did chew all medications. Pt attended group. Pt on unit in day room in free time. Pt reported stable mood, denied SI/HI/self harm thoughts. Pt avoids direct eye contact most of the time. Q 15 minute checks ongoing for safety.

## 2022-08-17 NOTE — [facility] Group Notes (Signed)
Spiritual care group on grief and loss facilitated by chaplain Janne Napoleon, [facility]   Group Goal:   Support / Education around grief and loss   Members engage in facilitated group support and psycho-social education.   Group Description:   Following introductions and group rules, group members engaged in facilitated group dialog and support around topic of loss, with particular support around experiences of loss in their lives. Group Identified types of loss (relationships / self / things) and identified patterns, circumstances, and changes that precipitate losses. Reflected on thoughts / feelings around loss, normalized grief responses, and recognized variety in grief experience. Group noted Worden's four tasks of grief in discussion.   Group drew on Adlerian / Rogerian, narrative, MI,   Patient Progress: Cheryl Adams attended group but did not participate.  Her eyes were closed throughout most of the group time.  [address], [place] Pager, [phone]

## 2022-08-17 NOTE — [facility] Group Notes (Signed)
Psychoeducational and Goals Group:     Purpose of Group: The groups focus is to identify and establish SMART goal for the shift. Group members participated in discussion about triggers for depression and anxiety. Participants were encouraged to share what they have learned and to actively listen to others.  Participation: Pt. actively listened throughout the group. She did not participate verbally.     Patient's Goal: Pt did not share a goal, non verbal during group.
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
## 2022-08-17 NOTE — Group Note (Signed)
LCSW Group Therapy Note   Group Date: 08/17/2022 Start Time: 1300 End Time: 1400  Type of Therapy and Topic: Group Therapy: Overcoming Obstacles  Participation Level: Active  Description of Group: In this group patients will be encouraged to explore what they see as obstacles to their own wellness and recovery. They will be guided to discuss their thoughts, feelings, and behaviors related to these obstacles. The group will process together ways to cope with barriers, with attention given to specific choices patients can make. Each patient will be challenged to identify changes they are motivated to make in order to overcome their obstacles. This group will be process-oriented, with patients participating in exploration of their own experiences as well as giving and receiving support and challenge from other group members.  Therapeutic Goals: 1. Patient will identify personal and current obstacles as they relate to admission. 2. Patient will identify barriers that currently interfere with their wellness or overcoming obstacles. 3. Patient will identify feelings, thought process and behaviors related to these barriers. 4. Patient will identify two changes they are willing to make to overcome these obstacles:  Summary of Patient Progress:  The Pt attended group and remained there the entire time.  The Pt accepted all worksheets and followed along throughout the session.  The Pt was appropriate with peers and participate in the discussion.  The Pt was able to see how their thoughts, feelings, and behaviors affect all outcomes/choices in their life.    Darleen Crocker, LCSWA 08/17/2022  2:31 PM

## 2022-08-18 DIAGNOSIS — F251 Schizoaffective disorder, depressive type: Secondary | ICD-10-CM | POA: Diagnosis not present

## 2022-08-18 MED ORDER — ATROPINE SULFATE 1 % OP SOLN
1.0000 [drp] | Freq: Three times a day (TID) | OPHTHALMIC | Status: DC
  Administered 2022-08-18 – 2022-09-10 (×67): 1 [drp] via SUBLINGUAL
  Filled 2022-08-18 (×2): qty 2

## 2022-08-18 MED ORDER — CLOZAPINE 25 MG PO TABS
250.0000 mg | ORAL_TABLET | Freq: Every day | ORAL | Status: DC
  Administered 2022-08-18 – 2022-08-21 (×4): 250 mg via ORAL
  Filled 2022-08-18 (×5): qty 2

## 2022-08-18 NOTE — Group Note (Signed)
Recreation Therapy Group Note   Group Topic:Animal Assisted Therapy   Group Date: 08/18/2022 Start Time: 7782 End Time: 1500 Facilitators: Victorino Sparrow, LRT,CTRS Location: [address] Dayroom   Animal-Assisted Activity (AAA) Program Checklist/Progress Notes Patient Eligibility Criteria Checklist & Daily Group note for Rec Tx Intervention  AAA/T Program Assumption of Risk Form signed by Patient/ or Parent Legal Guardian Yes  Patient is free of allergies or severe asthma Yes  Patient reports no fear of animals Yes  Patient reports no history of cruelty to animals Yes  Patient understands his/her participation is voluntary Yes  Patient washes hands before animal contact Yes  Patient washes hands after animal contact Yes   Affect/Mood: Appropriate   Participation Level: Minimal   Participation Quality: Independent   Behavior: Guarded    Clinical Observations/Individualized Feedback: Pt attended and participated for a little while.  Pt was quiet and had minimal interaction with the therapy dog.  Pt seemed reserved for the little time she was in group.    Plan: Continue to engage patient in RT group sessions 2-3x/week.   Victorino Sparrow, Glennis Brink 08/18/2022 3:43 PM
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
## 2022-08-18 NOTE — Progress Notes (Signed)
   08/18/22 0145  Psych Admission Type (Psych Patients Only)  Admission Status Voluntary  Psychosocial Assessment  Patient Complaints Worrying  Eye Contact Avoids  Facial Expression Flat  Affect Preoccupied;Blunted  Speech Logical/coherent  Interaction Cautious;Childlike  Motor Activity Slow  Appearance/Hygiene Disheveled;Body odor  Behavior Characteristics Cooperative  Mood Pleasant  Aggressive Behavior  Effect No apparent injury  Thought Process  Coherency Circumstantial  Content Blaming others  Delusions None reported or observed  Perception Hallucinations  Hallucination Auditory  Judgment Limited  Confusion None  Danger to Self  Current suicidal ideation? Denies  Danger to Others  Danger to Others None reported or observed

## 2022-08-18 NOTE — Progress Notes (Signed)
   08/18/22 0800  Psych Admission Type (Psych Patients Only)  Admission Status Voluntary  Psychosocial Assessment  Patient Complaints None  Eye Contact Avoids  Facial Expression Flat  Affect Preoccupied;Appropriate to circumstance  Speech Logical/coherent  Interaction Cautious;Childlike  Motor Activity Slow  Appearance/Hygiene Body odor  Behavior Characteristics Cooperative  Mood Pleasant  Thought Process  Coherency WDL;Other (Comment) (Difficult to assess, pt does not like to talk much.)  Content WDL  Delusions None reported or observed  Perception Hallucinations (Pt denies hallucinations but appears to preoccupied.)  Hallucination Auditory  Judgment Limited  Confusion None  Danger to Self  Current suicidal ideation? Denies  Self-Injurious Behavior No self-injurious ideation or behavior indicators observed or expressed   Agreement Not to Harm Self No  Description of Agreement Verbal  Danger to Others  Danger to Others None reported or observed

## 2022-08-18 NOTE — Progress Notes (Signed)
Wrap up group: List Positive thing about your day & 1 attribute about yourself Pt attended group as scheduled. Pt was appropriate with insight about discussion. Stated "Had a good meal. I like shopping at [employer].

## 2022-08-18 NOTE — Progress Notes (Signed)
   08/18/22 0600  Sleep  Number of Hours 7.25

## 2022-08-18 NOTE — Group Note (Signed)
Date:  08/18/2022 Time:  4:36 PM  Group Topic/Focus:  Orientation:   The focus of this group is to educate the patient on the purpose and policies of crisis stabilization and provide a format to answer questions about their admission.  The group details unit policies and expectations of patients while admitted.    Participation Level:  Active  Participation Quality:  Appropriate  Affect:  Appropriate  Cognitive:  Appropriate  Insight: Appropriate  Engagement in Group:  Engaged  Modes of Intervention:  Discussion  Additional Comments:     Jerrye Beavers 08/18/2022, 4:36 PM

## 2022-08-18 NOTE — Progress Notes (Signed)
   08/18/22 2100  Charting Type  Charting Type Shift assessment  Safety Check Verification  Has the RN verified the 15 minute safety check completion? Yes  Neurological  Neuro (WDL) WDL  HEENT  HEENT (WDL) WDL  Respiratory  Respiratory (WDL) WDL  Cardiac  Cardiac (WDL) WDL  Vascular  Vascular (WDL) WDL  Integumentary  Integumentary (WDL) WDL  Braden Scale (Ages 8 and up)  Sensory Perceptions 4  Moisture 4  Activity 4  Mobility 4  Nutrition 3  Friction and Shear 3  Braden Scale Score 22  Musculoskeletal  Musculoskeletal (WDL) WDL  Assistive Device None  Gastrointestinal  Gastrointestinal (WDL) WDL  Neurological  Level of Consciousness Alert

## 2022-08-18 NOTE — Plan of Care (Signed)
  Problem: Education: Goal: Emotional status will improve Outcome: Progressing Goal: Verbalization of understanding the information provided will improve Outcome: Progressing   Problem: Activity: Goal: Interest or engagement in leisure activities will improve Outcome: Progressing   Problem: Coping: Goal: Will verbalize feelings Outcome: Progressing

## 2022-08-19 ENCOUNTER — Encounter (HOSPITAL_COMMUNITY): Payer: Self-pay

## 2022-08-19 DIAGNOSIS — F251 Schizoaffective disorder, depressive type: Secondary | ICD-10-CM | POA: Diagnosis not present

## 2022-08-19 DIAGNOSIS — K117 Disturbances of salivary secretion: Secondary | ICD-10-CM

## 2022-08-19 NOTE — Progress Notes (Signed)
Adult Psychoeducational Group Note  Date:  08/19/2022 Time:  8:15 PM  Group Topic/Focus:  Wrap-Up Group:   The focus of this group is to help patients review their daily goal of treatment and discuss progress on daily workbooks.  Participation Level:  Active  Participation Quality:  Appropriate  Affect:  Appropriate  Cognitive:  Appropriate  Insight: Appropriate  Engagement in Group:  Engaged  Modes of Intervention:  Discussion  Additional Comments:  Attend NA wrap up group  Lenice Llamas Long 08/19/2022, 8:15 PM

## 2022-08-19 NOTE — Progress Notes (Signed)
   08/19/22 2045  Psych Admission Type (Psych Patients Only)  Admission Status Voluntary  Psychosocial Assessment  Patient Complaints None  Eye Contact Avoids  Facial Expression Flat  Affect Preoccupied  Speech Logical/coherent  Interaction Cautious  Motor Activity Slow  Appearance/Hygiene Disheveled  Behavior Characteristics Cooperative  Mood Depressed  Aggressive Behavior  Effect No apparent injury  Thought Process  Coherency WDL  Content WDL  Delusions WDL  Perception WDL  Hallucination None reported or observed  Judgment Limited  Confusion None

## 2022-08-19 NOTE — Progress Notes (Signed)
Adult Psychoeducational Group Note  Date:  08/19/2022 Time:  10:38 AM  Group Topic/Focus:  Goals Group:   The focus of this group is to help patients establish daily goals to achieve during treatment and discuss how the patient can incorporate goal setting into their daily lives to aide in recovery.  Participation Level:  Active  Participation Quality:  Appropriate  Affect:  Appropriate  Cognitive:  Appropriate  Insight: Appropriate  Engagement in Group:  Engaged  Modes of Intervention:  Discussion  Additional Comments:  Patient attended morning orientation/goal setting group and participated.  Kiyla Ringler W Aaiden Depoy 5/[id number]/9244, 10:38 AM
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
## 2022-08-19 NOTE — Plan of Care (Signed)
  Problem: Education: Goal: Knowledge of [facility] Education information/materials will improve Outcome: Progressing Goal: Emotional status will improve Outcome: Progressing Goal: Mental status will improve Outcome: Progressing Goal: Verbalization of understanding the information provided will improve Outcome: Progressing   Problem: Activity: Goal: Interest or engagement in activities will improve Outcome: Progressing Goal: Sleeping patterns will improve Outcome: Progressing   Problem: Coping: Goal: Ability to verbalize frustrations and anger appropriately will improve Outcome: Progressing Goal: Ability to demonstrate self-control will improve Outcome: Progressing   Problem: Health Behavior/Discharge Planning: Goal: Identification of resources available to assist in meeting health care needs will improve Outcome: Progressing Goal: Compliance with treatment plan for underlying cause of condition will improve Outcome: Progressing   Problem: Physical Regulation: Goal: Ability to maintain clinical measurements within normal limits will improve Outcome: Progressing   Problem: Safety: Goal: Periods of time without injury will increase Outcome: Progressing   Problem: Activity: Goal: Interest or engagement in leisure activities will improve Outcome: Progressing Goal: Imbalance in normal sleep/wake cycle will improve Outcome: Progressing   Problem: Coping: Goal: Coping ability will improve Outcome: Progressing Goal: Will verbalize feelings Outcome: Progressing   Problem: Coping: Goal: Ability to identify and develop effective coping behavior will improve Outcome: Progressing   Problem: Education: Goal: Will be free of psychotic symptoms Outcome: Progressing Goal: Knowledge of the prescribed therapeutic regimen will improve Outcome: Progressing   Problem: Coping: Goal: Coping ability will improve Outcome: Progressing Goal: Will verbalize feelings Outcome:  Progressing   Problem: Health Behavior/Discharge Planning: Goal: Compliance with prescribed medication regimen will improve Outcome: Progressing   Problem: Nutritional: Goal: Ability to achieve adequate nutritional intake will improve Outcome: Progressing   Problem: Role Relationship: Goal: Ability to communicate needs accurately will improve Outcome: Progressing Goal: Ability to interact with others will improve Outcome: Progressing   Problem: Safety: Goal: Ability to redirect hostility and anger into socially appropriate behaviors will improve Outcome: Progressing Goal: Ability to remain free from injury will improve Outcome: Progressing

## 2022-08-19 NOTE — Group Note (Signed)
LCSW Group Therapy Note   Group Date: 08/19/2022 Start Time: 1300 End Time: 1400  Type of Therapy and Topic: Group Therapy: Anger Management   Participation Level:  Active  Description of Group: In this group, patients will learn helpful strategies and techniques to manage anger, express anger in alternative ways, change hostile attitudes, and prevent aggressive acts, such as verbal abuse and violence.This group will be process-oriented and eductional, with patients participating in exploration of their own experiences as well as giving and receiving support and challenge from other group members.  Therapeutic Goals: Patient will learn to manage anger. Patient will learn to stop violence or the threat of violence. Patient will learn to develop self control over thoughts and actions. Patient will receive support and feedback from others  Summary of Patient Progress:  The Pt attended group and remained there the entire time.  The Pt accepted all worksheets and participated in the group discussion.  The Pt was appropriate with peers and was able to share ways they can control their anger, as well as coping skills they can use the next time they begin to feel angry.    Therapeutic Modalities: Cognitive Behavioral Therapy Solution Focused Therapy Motivational Interviewing  Darleen Crocker, [place] 08/19/2022  1:59 PM

## 2022-08-19 NOTE — BH IP Treatment Plan (Signed)
Interdisciplinary Treatment and Diagnostic Plan Update  08/19/2022 Time of Session: 0830 Cheryl Adams MRN: [id number]  Principal Diagnosis: Schizoaffective disorder, depressive type [facility])  Secondary Diagnoses: Principal Problem:   Schizoaffective disorder, depressive type ([place]) Active Problems:   GAD (generalized anxiety disorder)   Vitamin D deficiency   Sialorrhea   Current Medications:  Current Facility-Administered Medications  Medication Dose Route Frequency Provider Last Rate Last Admin   acetaminophen (TYLENOL) tablet 500 mg  500 mg Oral Q6H PRN Massengill, Ovid Curd, MD       amLODipine (NORVASC) tablet 10 mg  10 mg Oral Daily Massengill, Nathan, MD   10 mg at 08/19/22 0809   antiseptic oral rinse (BIOTENE) solution 15 mL  15 mL Mouth Rinse 5 X Daily PRN Harlow Asa, MD       atenolol (TENORMIN) tablet 25 mg  25 mg Oral Daily Massengill, Ovid Curd, MD   25 mg at 08/19/22 0809   atropine 1 % ophthalmic solution 1 drop  1 drop Sublingual TID Janine Limbo, MD   1 drop at 08/19/22 0809   cloZAPine (CLOZARIL) tablet 250 mg  250 mg Oral QHS Massengill, Ovid Curd, MD   250 mg at 08/18/22 2125   docusate sodium (COLACE) capsule 100 mg  100 mg Oral BID Massengill, Ovid Curd, MD   100 mg at 08/19/22 0809   ferrous sulfate tablet 325 mg  325 mg Oral Daily Merrily Brittle, DO   325 mg at 08/19/22 0809   hydrOXYzine (ATARAX) tablet 25 mg  25 mg Oral TID PRN Janine Limbo, MD   25 mg at 06/17/22 2027   OLANZapine zydis (ZYPREXA) disintegrating tablet 5 mg  5 mg Oral Q8H PRN Massengill, Ovid Curd, MD   5 mg at 05/24/22 2154   And   LORazepam (ATIVAN) tablet 1 mg  1 mg Oral Q8H PRN Massengill, Ovid Curd, MD   1 mg at 07/03/22 2150   And   ziprasidone (GEODON) injection 20 mg  20 mg Intramuscular Q8H PRN Massengill, Ovid Curd, MD       metFORMIN (GLUCOPHAGE-XR) 24 hr tablet 500 mg  500 mg Oral Q breakfast Massengill, Nathan, MD   500 mg at 08/19/22 0809   methylphenidate (RITALIN) tablet 10 mg  10  mg Oral Daily Hill, Jackie Plum, MD   10 mg at 08/19/22 0809   polyethylene glycol (MIRALAX / GLYCOLAX) packet 17 g  17 g Oral Daily Massengill, Ovid Curd, MD   17 g at 08/19/22 0809   sertraline (ZOLOFT) tablet 100 mg  100 mg Oral Daily Massengill, Ovid Curd, MD   100 mg at 08/19/22 0809   vitamin D3 (CHOLECALCIFEROL) tablet 1,000 Units  1,000 Units Oral Daily Massengill, Ovid Curd, MD   1,000 Units at 08/19/22 0809   PTA Medications: Medications Prior to Admission  Medication Sig Dispense Refill Last Dose   ferrous sulfate 325 (65 FE) MG tablet Take 325 mg by mouth daily.      risperiDONE (RISPERDAL) 1 MG tablet Take 1 tablet (1 mg total) by mouth daily.      risperiDONE (RISPERDAL) 2 MG tablet Take 1 tablet (2 mg total) by mouth at bedtime.       Patient Stressors: Health problems   Marital or family conflict   Medication change or noncompliance    Patient Strengths: Average or above average intelligence  Supportive family/friends   Treatment Modalities: Medication Management, Group therapy, Case management,  1 to 1 session with clinician, Psychoeducation, Recreational therapy.   Physician Treatment Plan for Primary Diagnosis:  Schizoaffective disorder, depressive type ([place]) Long Term Goal(s): Improvement in symptoms so as ready for discharge   Short Term Goals: Ability to verbalize feelings will improve Ability to disclose and discuss suicidal ideas Ability to identify and develop effective coping behaviors will improve Compliance with prescribed medications will improve  Medication Management: Evaluate patient's response, side effects, and tolerance of medication regimen.  Therapeutic Interventions: 1 to 1 sessions, Unit Group sessions and Medication administration.  Evaluation of Outcomes: Progressing  Physician Treatment Plan for Secondary Diagnosis: Principal Problem:   Schizoaffective disorder, depressive type ([place]) Active Problems:   GAD (generalized anxiety disorder)    Vitamin D deficiency   Sialorrhea  Long Term Goal(s): Improvement in symptoms so as ready for discharge   Short Term Goals: Ability to verbalize feelings will improve Ability to disclose and discuss suicidal ideas Ability to identify and develop effective coping behaviors will improve Compliance with prescribed medications will improve     Medication Management: Evaluate patient's response, side effects, and tolerance of medication regimen.  Therapeutic Interventions: 1 to 1 sessions, Unit Group sessions and Medication administration.  Evaluation of Outcomes: Progressing   RN Treatment Plan for Primary Diagnosis: Schizoaffective disorder, depressive type ([place]) Long Term Goal(s): Knowledge of disease and therapeutic regimen to maintain health will improve  Short Term Goals: Ability to remain free from injury will improve, Ability to verbalize frustration and anger appropriately will improve, Ability to demonstrate self-control, Ability to participate in decision making will improve, Ability to verbalize feelings will improve, Ability to disclose and discuss suicidal ideas, Ability to identify and develop effective coping behaviors will improve, and Compliance with prescribed medications will improve  Medication Management: RN will administer medications as ordered by provider, will assess and evaluate patient's response and provide education to patient for prescribed medication. RN will report any adverse and/or side effects to prescribing provider.  Therapeutic Interventions: 1 on 1 counseling sessions, Psychoeducation, Medication administration, Evaluate responses to treatment, Monitor vital signs and CBGs as ordered, Perform/monitor CIWA, COWS, AIMS and Fall Risk screenings as ordered, Perform wound care treatments as ordered.  Evaluation of Outcomes: Progressing   LCSW Treatment Plan for Primary Diagnosis: Schizoaffective disorder, depressive type ([place]) Long Term Goal(s): Safe  transition to appropriate next level of care at discharge, Engage patient in therapeutic group addressing interpersonal concerns.  Short Term Goals: Engage patient in aftercare planning with referrals and resources, Increase social support, Increase ability to appropriately verbalize feelings, Increase emotional regulation, Facilitate acceptance of mental health diagnosis and concerns, Facilitate patient progression through stages of change regarding substance use diagnoses and concerns, Identify triggers associated with mental health/substance abuse issues, and Increase skills for wellness and recovery  Therapeutic Interventions: Assess for all discharge needs, 1 to 1 time with Social worker, Explore available resources and support systems, Assess for adequacy in community support network, Educate family and significant other(s) on suicide prevention, Complete Psychosocial Assessment, Interpersonal group therapy.  Evaluation of Outcomes: Progressing   Progress in Treatment: Attending groups: Yes. Participating in groups: Yes. Taking medication as prescribed: Yes. Toleration medication: Yes. Family/Significant other contact made: Yes, individual(s) contacted:  Ya Rizzotti [phone] (Mother) Patient understands diagnosis: Yes. Discussing patient identified problems/goals with staff: Yes. Medical problems stabilized or resolved: Yes. Denies suicidal/homicidal ideation: Yes. Issues/concerns per patient self-inventory: Yes. Other: none  New problem(s) identified: No, Describe:  none  New Short Term/Long Term Goal(s): Patient to work towards detox, elimination of symptoms of psychosis, medication management for mood stabilization; elimination of SI thoughts;  development of comprehensive mental wellness/sobriety plan.  Patient Goals:  No additional goals identified at this time. Patient to continue to work towards original goals identified in initial treatment team meeting. CSW will remain  available to patient should they voice additional treatment goals.   Discharge Plan or Barriers: Patient lacks adequate housing at discharge; incapable of being discharged to shelter due to limited capacity. CSW to continue pursuing options and funding.   Reason for Continuation of Hospitalization: Other; describe psychotic features   Estimated Length of Stay: TBD   Last [place] Suicide Severity Risk Score: Flowsheet Row Admission (Current) from 05/19/2022 in [place] [facility] ED from 05/18/2022 in [facility] Office Visit from 04/14/2022 in [place] No Risk High Risk No Risk       Scribe for Treatment Team: Larose Kells 08/19/2022 10:05 AM

## 2022-08-19 NOTE — Plan of Care (Signed)
  Problem: Education: Goal: Emotional status will improve 08/19/2022 0037 by Alita Chyle, RN Outcome: Progressing 08/18/2022 2240 by Alita Chyle, RN Outcome: Progressing Goal: Mental status will improve Outcome: Progressing Goal: Verbalization of understanding the information provided will improve Outcome: Progressing   Problem: Activity: Goal: Interest or engagement in leisure activities will improve Outcome: Progressing   Problem: Coping: Goal: Will verbalize feelings Outcome: Progressing   Problem: Coping: Goal: Will verbalize feelings Outcome: Progressing

## 2022-08-19 NOTE — Progress Notes (Signed)
Patient appears pleasant. Patient denies SI/HI/AVH. Pt reports anxiety and depression is a 4/10. Pt reports good sleep and appetite. Patient complied with morning medication with no reported side effects. Patient remains safe on Q27min checks and contracts for safety.       08/19/22 0908  Psych Admission Type (Psych Patients Only)  Admission Status Voluntary  Psychosocial Assessment  Patient Complaints None  Eye Contact Avoids  Facial Expression Flat  Affect Preoccupied;Blunted  Speech Logical/coherent  Interaction Cautious  Motor Activity Slow  Appearance/Hygiene Unremarkable  Behavior Characteristics Cooperative;Appropriate to situation  Mood Depressed;Pleasant  Thought Process  Coherency WDL  Content WDL  Delusions None reported or observed  Perception WDL  Hallucination None reported or observed  Judgment Limited  Confusion None  Danger to Self  Current suicidal ideation? Denies  Self-Injurious Behavior No self-injurious ideation or behavior indicators observed or expressed   Agreement Not to Harm Self Yes  Description of Agreement verbal  Danger to Others  Danger to Others None reported or observed

## 2022-08-19 NOTE — Group Note (Signed)
Recreation Therapy Group Note   Group Topic:Stress Management  Group Date: 08/19/2022 Start Time: 0930 End Time: 1000 Facilitators: Victorino Sparrow, LRT,CTRS Location: [address] Dayroom   Goal Area(s) Addresses:  Patient will actively participate in stress management techniques presented during session.  Patient will successfully identify benefit of practicing stress management post d/c.   Group Description: Guided Imagery. LRT provided education, instruction, and demonstration on practice of visualization via guided imagery. Patient was asked to participate in the technique introduced during session. LRT debriefed including topics of mindfulness, stress management and specific scenarios each patient could use these techniques. Patients were given suggestions of ways to access scripts post d/c and encouraged to explore Youtube and other apps available on smartphones, tablets, and computers.   Clinical Observations/Feedback:  LRT was unable to hold group due to miscommunication with MHT about group time.   Plan: Continue to engage patient in RT group sessions 2-3x/week.   Victorino Sparrow, Glennis Brink 08/19/2022 12:37 PM

## 2022-08-20 DIAGNOSIS — F251 Schizoaffective disorder, depressive type: Secondary | ICD-10-CM | POA: Diagnosis not present

## 2022-08-20 NOTE — Progress Notes (Signed)
Pt on unit. Pt medication compliant. Pt reports good efficacy of atropine gtts for drooling. Pt denies SI/HI/self harm thoughts as well as a/v hallucinations. Q 15 minute checks ongoing for safety.

## 2022-08-20 NOTE — Group Note (Signed)
Date:  08/20/2022 Time:  10:42 AM  Group Topic/Focus:  Orientation:   The focus of this group is to educate the patient on the purpose and policies of crisis stabilization and provide a format to answer questions about their admission.  The group details unit policies and expectations of patients while admitted.    Participation Level:  Active  Participation Quality:  Appropriate  Affect:  Appropriate  Cognitive:  Appropriate  Insight: Limited  Engagement in Group:  Limited  Modes of Intervention:  Discussion  Additional Comments:     Jerrye Beavers 08/20/2022, 10:42 AM

## 2022-08-20 NOTE — BHH Counselor (Signed)
CSW spoke with [facility] at [facility], who called to ask about the [name] discharge date.  CSW informed Vikki Ports that there is not a scheduled discharge date at this time and that the Pt continues to be in the hospital.  CSW did inform Vikki Ports that the Pt has a Medicaid application that is pending and has a Scientist, research (physical sciences) Guardian through [place].  CSW also informed Vikki Ports that DSS is looking for a group home placement at this time. CSW was advised to contact the [facility] ACT team once there is a scheduled discharge date for the Pt.   Pt has been accepted for ACT services upon discharge.

## 2022-08-20 NOTE — Progress Notes (Signed)
   08/20/22 2015  Psych Admission Type (Psych Patients Only)  Admission Status Voluntary  Psychosocial Assessment  Patient Complaints None  Eye Contact Avoids  Facial Expression Flat  Affect Preoccupied  Speech Logical/coherent  Interaction Cautious  Motor Activity Slow  Appearance/Hygiene Disheveled  Behavior Characteristics Cooperative  Mood Pleasant  Aggressive Behavior  Effect No apparent injury  Thought Process  Coherency WDL  Content WDL  Delusions WDL  Perception WDL  Hallucination None reported or observed  Judgment Limited  Confusion None

## 2022-08-20 NOTE — Progress Notes (Signed)
   08/20/22 0515  Sleep  Number of Hours 7
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
## 2022-08-20 NOTE — Progress Notes (Signed)
The focus of this group is to help patients review their daily goal of treatment and discuss progress on daily workbooks.  Pt attended the evening group and responded to all discussion prompts from the [place]. Pt shared that today was a good day on the unit, the highlight of which were good meals in the cafeteria.  On the subject of staying well upon discharge, Pt mentioned her plan to take her medications as prescribed. "I stopped taking my meds because I was feeling so well, but now I know I can't do that again."  Pt rated her day a 4 out of 10 and she appeared shy when directly engaged.

## 2022-08-21 DIAGNOSIS — F251 Schizoaffective disorder, depressive type: Secondary | ICD-10-CM | POA: Diagnosis not present

## 2022-08-21 NOTE — Progress Notes (Signed)
   08/21/22 2130  Psych Admission Type (Psych Patients Only)  Admission Status Voluntary  Psychosocial Assessment  Patient Complaints None  Eye Contact Avoids  Facial Expression Flat  Affect Preoccupied  Speech Logical/coherent  Interaction Cautious  Motor Activity Slow  Appearance/Hygiene Disheveled  Behavior Characteristics Cooperative  Mood Pleasant  Aggressive Behavior  Effect No apparent injury  Thought Process  Coherency WDL  Content WDL  Delusions WDL  Perception WDL  Hallucination None reported or observed  Judgment Limited  Confusion None

## 2022-08-21 NOTE — Group Note (Signed)
Recreation Therapy Group Note   Group Topic:Team Building  Group Date: 08/21/2022 Start Time: 0930 End Time: 0950 Facilitators: Victorino Sparrow, LRT,CTRS Location: 300 Hall Dayroom   Goal Area(s) Addresses:  Patient will effectively work with peer towards shared goal.  Patient will identify skills used to make activity successful.  Patient will identify how skills used during activity can be applied to reach post d/c goals.   Group Description: [employer]. In teams of 5-6, patients were given 12 craft pipe cleaners. Using the materials provided, patients were instructed to compete again the opposing team(s) to build the tallest free-standing structure from floor level. The activity was timed; difficulty increased by Probation officer as Pharmacist, hospital continued.  Systematically resources were removed with additional directions for example, placing one arm behind their back, working in silence, and shape stipulations. LRT facilitated post-activity discussion reviewing team processes and necessary communication skills involved in completion. Patients were encouraged to reflect how the skills utilized, or not utilized, in this activity can be incorporated to positively impact support systems post discharge.   Affect/Mood: Appropriate   Participation Level: Engaged   Participation Quality: Independent   Behavior: Appropriate   Speech/Thought Process: Focused   Insight: Good   Judgement: Good   Modes of Intervention: Team-building   Patient Response to Interventions:  Engaged   Education Outcome:  Acknowledges education and In group clarification offered    Clinical Observations/Individualized Feedback: Pt was quiet but engaged throughout the activity.  Pt worked well with peers in creating a successful tower.     Plan: Continue to engage patient in RT group sessions 2-3x/week.   Victorino Sparrow, LRT,CTRS  08/21/2022 1:02 PM

## 2022-08-21 NOTE — Group Note (Signed)
LCSW Group Therapy Note   Group Date: 08/21/2022 Start Time: 1300 End Time: 1400   Type of Therapy and Topic:  Group Therapy: Boundaries  Participation Level:  Minimal  Description of Group: This group will address the use of boundaries in their personal lives. Patients will explore why boundaries are important, the difference between healthy and unhealthy boundaries, and negative and postive outcomes of different boundaries and will look at how boundaries can be crossed.  Patients will be encouraged to identify current boundaries in their own lives and identify what kind of boundary is being set. Facilitators will guide patients in utilizing problem-solving interventions to address and correct types boundaries being used and to address when no boundary is being used. Understanding and applying boundaries will be explored and addressed for obtaining and maintaining a balanced life. Patients will be encouraged to explore ways to assertively make their boundaries and needs known to significant others in their lives, using other group members and facilitator for role play, support, and feedback.  Therapeutic Goals:  1.  Patient will identify areas in their life where setting clear boundaries could be  used to improve their life.  2.  Patient will identify signs/triggers that a boundary is not being respected. 3.  Patient will identify two ways to set boundaries in order to achieve balance in  their lives: 4.  Patient will demonstrate ability to communicate their needs and set boundaries  through discussion and/or role plays  Summary of Patient Progress:   Patient was present for the entirety of group session. Patient participated in opening and closing remarks. However, patient did not contribute at all to the topic of discussion despite encouraged participation.   Therapeutic Modalities:   Cognitive Behavioral Therapy Solution-Focused Therapy  Larose Kells 08/21/2022  2:22 PM

## 2022-08-21 NOTE — Progress Notes (Signed)
Pt medication compliant. Pt asked nurse to help her shower today. Nurse encouraged pt to shower independently and checked in after to ensure pt did address ADLs. Pt denies SI/HI/self harm thoughts. Q 15 minute checks ongoing for safety.

## 2022-08-21 NOTE — Progress Notes (Signed)
   08/21/22 0515  Sleep  Number of Hours 7.75
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
## 2022-08-21 NOTE — Group Note (Signed)
Date:  08/21/2022 Time:  10:29 AM  Group Topic/Focus:  Orientation:   The focus of this group is to educate the patient on the purpose and policies of crisis stabilization and provide a format to answer questions about their admission.  The group details unit policies and expectations of patients while admitted.    Participation Level:  Minimal  Participation Quality:  Inattentive  Affect:  Flat  Cognitive:  Lacking  Insight: Lacking  Engagement in Group:  Lacking  Modes of Intervention:  Discussion  Additional Comments:     Jerrye Beavers 08/21/2022, 10:29 AM

## 2022-08-21 NOTE — Group Note (Signed)
Date:  08/21/2022 Time:  3:22 PM  Group Topic/Focus:  Wellness Toolbox:   The focus of this group is to discuss various aspects of wellness, balancing those aspects and exploring ways to increase the ability to experience wellness.  Patients will create a wellness toolbox for use upon discharge.    Participation Level:  None  Participation Quality:  Inattentive  Affect:  Blunted  Cognitive:  Lacking  Insight: Lacking  Engagement in Group:  Lacking  Modes of Intervention:  Education  Additional Comments:  Patient did not participate in group, but was asked questions about positive and negative emotions and how to identify them.  Jerrye Beavers 08/21/2022, 3:22 PM

## 2022-08-22 DIAGNOSIS — F251 Schizoaffective disorder, depressive type: Secondary | ICD-10-CM | POA: Diagnosis not present

## 2022-08-22 MED ORDER — CLOZAPINE 100 MG PO TABS
200.0000 mg | ORAL_TABLET | Freq: Every day | ORAL | Status: DC
  Administered 2022-08-22 – 2022-09-09 (×19): 200 mg via ORAL
  Filled 2022-08-22 (×21): qty 2

## 2022-08-22 NOTE — Progress Notes (Signed)
Pt denies SI/HI/AVH.  Pt says she is not in any distress.  Pt took medications without incident and no adverse reactions are noted.  Pt denies pain and reported she had a bowel movement yesterday. No immediate concerns are noted at this time.  RN will continue to monitor pt's progress and provide assistance as needed.

## 2022-08-22 NOTE — Group Note (Signed)
LCSW Group Therapy Note  08/22/2022    10:00-11:00am   Type of Therapy and Topic:  Group Therapy: Early Messages Received About Anger  Participation Level:  Active   Description of Group:   In this group, patients shared and discussed the early messages received in their lives about anger through parental or other adult modeling, teaching, repression, punishment, violence, and more.  Participants identified how those childhood lessons influence even now how they usually or often react when angered.  The group discussed that anger is a secondary emotion and what may be the underlying emotional themes that come out through anger outbursts or that are ignored through anger suppression.    Therapeutic Goals: Patients will identify one or more childhood message about anger that they received and how it was taught to them. Patients will discuss how these childhood experiences have influenced and continue to influence their own expression or repression of anger even today. Patients will explore possible primary emotions that tend to fuel their secondary emotion of anger. Patients will learn that anger itself is normal and cannot be eliminated, and that healthier coping skills can assist with resolving conflict rather than worsening situations.  Summary of Patient Progress:  The patient shared that their childhood lessons about anger were "I'm not sure.".  As a result, today she is usually passive and tries to avoid anger.  The patient participated fully and demonstrated insight.  Therapeutic Modalities:   Cognitive Behavioral Therapy Motivation Interviewing  [place], [place] 08/22/2022 11:48 AM
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
## 2022-08-22 NOTE — Progress Notes (Signed)
   08/22/22 0615  Sleep  Number of Hours 8

## 2022-08-22 NOTE — Progress Notes (Signed)
Adult Psychoeducational Group Note  Date:  08/22/2022 Time:  9:36 PM  Group Topic/Focus:  Wrap-Up Group:   The focus of this group is to help patients review their daily goal of treatment and discuss progress on daily workbooks.  Participation Level:  Active  Participation Quality:  Appropriate  Affect:  Appropriate  Cognitive:  Appropriate  Insight: Appropriate  Engagement in Group:  Engaged  Modes of Intervention:  Education and Exploration  Additional Comments:  Patient attended and participated in group tonight. She reports that she has learnt different coping skills and how to communicate while being here  Debe Coder 08/22/2022, 9:36 PM

## 2022-08-23 DIAGNOSIS — F251 Schizoaffective disorder, depressive type: Secondary | ICD-10-CM | POA: Diagnosis not present

## 2022-08-23 NOTE — Group Note (Signed)
[facility] LCSW Group Therapy Note  Date/Time:  08/23/2022 10:00-11:00AM  Type of Therapy and Topic:  Group Therapy:  Healthy and Unhealthy Supports plus being "My Own Hero"  Participation Level:  Active   Description of Group: Patients in this group were invited to identify the differences between healthy and unhealthy supports and then to identify those people in their lives who fall into one category or the other, as well as why.  They were then introduced to the idea of adding more healthy supports and decreasing the unhealthy ones.  Patients discussed what additional healthy supports could be helpful in their recovery and wellness after discharge in order to maintain stability.   An emphasis was placed on using counselor, doctor, therapy groups, 12-step groups, and problem-specific support groups to expand supports.  The song "My Own Hero" was played to encourage full participation in their own recovery journey instead of expecting others to do all the work for them.  The song "I Know Where I've Been" was played as further encouragement that they are further in their journey than they were previously.  Therapeutic Goals:   1)  discuss importance of adding supports to stay well once out of the hospital  2)  compare healthy versus unhealthy supports and identify some examples of each  3)  generate ideas and descriptions of healthy supports that can be added  4)  offer mutual support about how to address unhealthy supports  5)  encourage active participation in and adherence to discharge plan    Summary of Patient Progress:  The patient stated that current healthy supports in their life are "just me, these groups" while current unhealthy supports include "none". The patient expressed a willingness to add appropriate support(s) to help in their recovery journey.   Therapeutic Modalities:   Motivational Interviewing Brief Solution-Focused Therapy  [place], [place] 08/23/2022 11:30  AM

## 2022-08-23 NOTE — Progress Notes (Signed)
Pt attended groups today, completed self inventory sheet. Pt rates anxiety and depression 4/10 today. Pt denies SI/HI/self harm thoughts as well as a/v hallucinations. Pt medication compliant, minimal drool noted. Q 15 minute checks ongoing for safety.

## 2022-08-23 NOTE — Progress Notes (Signed)
Pt attended wrap up and is sitting quietly watching a movie.
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
## 2022-08-23 NOTE — Progress Notes (Signed)
Pt reports decreased anxiety tonight. She rates it a 4 on a scale of 0-10 (10 being the worst). Pt said that she has found meditation to be effective as well. She reports fair sleep. Pt still endorses paranoia against her family. Pt asked "Can I skip this question?" when asked about what are some thoughts she has about her family. She did attend group earlier tonight. Denies constipation and reports having a bowel movement 08/22/2022. Pt denies SI/HI and AVH. Active listening, reassurance, and support provided. Q 15 min safety checks continue. [name] safety has been maintained.   08/22/22 2000  Psych Admission Type (Psych Patients Only)  Admission Status Voluntary  Psychosocial Assessment  Patient Complaints Anxiety;Nervousness  Eye Contact Avoids;Brief  Facial Expression Anxious;Flat  Affect Anxious;Preoccupied  Speech Logical/coherent;Soft  Interaction Cautious;Forwards little  Motor Activity Slow  Appearance/Hygiene Disheveled  Behavior Characteristics Cooperative;Anxious;Guarded  Mood Anxious;Pleasant  Thought Process  Coherency WDL  Content Paranoia  Delusions Paranoid  Perception WDL  Hallucination None reported or observed  Judgment Limited  Confusion None  Danger to Self  Current suicidal ideation? Denies  Self-Injurious Behavior No self-injurious ideation or behavior indicators observed or expressed   Agreement Not to Harm Self Yes  Description of Agreement verbally contracts for safety  Danger to Others  Danger to Others None reported or observed

## 2022-08-24 ENCOUNTER — Encounter (HOSPITAL_COMMUNITY): Payer: Self-pay

## 2022-08-24 DIAGNOSIS — F251 Schizoaffective disorder, depressive type: Secondary | ICD-10-CM | POA: Diagnosis not present

## 2022-08-24 LAB — CBC WITH DIFFERENTIAL/PLATELET
Abs Immature Granulocytes: 0.07 10*3/uL (ref 0.00–0.07)
Basophils Absolute: 0.1 10*3/uL (ref 0.0–0.1)
Basophils Relative: 1 %
Eosinophils Absolute: 0.2 10*3/uL (ref 0.0–0.5)
Eosinophils Relative: 3 %
HCT: 38.4 % (ref 36.0–46.0)
Hemoglobin: 11.9 g/dL — ABNORMAL LOW (ref 12.0–15.0)
Immature Granulocytes: 1 %
Lymphocytes Relative: 28 %
Lymphs Abs: 1.7 10*3/uL (ref 0.7–4.0)
MCH: 26.2 pg (ref 26.0–34.0)
MCHC: 31 g/dL (ref 30.0–36.0)
MCV: 84.4 fL (ref 80.0–100.0)
Monocytes Absolute: 0.4 10*3/uL (ref 0.1–1.0)
Monocytes Relative: 7 %
Neutro Abs: 3.6 10*3/uL (ref 1.7–7.7)
Neutrophils Relative %: 60 %
Platelets: 255 10*3/uL (ref 150–400)
RBC: 4.55 MIL/uL (ref 3.87–5.11)
RDW: 14.5 % (ref 11.5–15.5)
WBC: 6.1 10*3/uL (ref 4.0–10.5)
nRBC: 0 % (ref 0.0–0.2)

## 2022-08-24 NOTE — BH IP Treatment Plan (Signed)
Interdisciplinary Treatment and Diagnostic Plan Update  08/24/2022 Time of Session: 9:35am  MAREESA GATHRIGHT MRN: [id number]  Principal Diagnosis: Schizoaffective disorder, depressive type ([place])  Secondary Diagnoses: Principal Problem:   Schizoaffective disorder, depressive type ([place]) Active Problems:   GAD (generalized anxiety disorder)   Vitamin D deficiency   Sialorrhea   Current Medications:  Current Facility-Administered Medications  Medication Dose Route Frequency Provider Last Rate Last Admin   acetaminophen (TYLENOL) tablet 500 mg  500 mg Oral Q6H PRN Massengill, Ovid Curd, MD       amLODipine (NORVASC) tablet 10 mg  10 mg Oral Daily Massengill, Ovid Curd, MD   10 mg at 08/24/22 0806   antiseptic oral rinse (BIOTENE) solution 15 mL  15 mL Mouth Rinse 5 X Daily PRN Harlow Asa, MD       atenolol (TENORMIN) tablet 25 mg  25 mg Oral Daily Massengill, Ovid Curd, MD   25 mg at 08/24/22 0806   atropine 1 % ophthalmic solution 1 drop  1 drop Sublingual TID Janine Limbo, MD   1 drop at 08/24/22 0805   cloZAPine (CLOZARIL) tablet 200 mg  200 mg Oral QHS Nkwenti, Doris, NP   200 mg at 08/23/22 2128   docusate sodium (COLACE) capsule 100 mg  100 mg Oral BID Massengill, Ovid Curd, MD   100 mg at 08/24/22 0630   ferrous sulfate tablet 325 mg  325 mg Oral Daily Merrily Brittle, DO   325 mg at 08/24/22 1601   hydrOXYzine (ATARAX) tablet 25 mg  25 mg Oral TID PRN Janine Limbo, MD   25 mg at 06/17/22 2027   OLANZapine zydis (ZYPREXA) disintegrating tablet 5 mg  5 mg Oral Q8H PRN Massengill, Ovid Curd, MD   5 mg at 05/24/22 2154   And   LORazepam (ATIVAN) tablet 1 mg  1 mg Oral Q8H PRN Massengill, Ovid Curd, MD   1 mg at 07/03/22 2150   And   ziprasidone (GEODON) injection 20 mg  20 mg Intramuscular Q8H PRN Massengill, Ovid Curd, MD       metFORMIN (GLUCOPHAGE-XR) 24 hr tablet 500 mg  500 mg Oral Q breakfast Massengill, Nathan, MD   500 mg at 08/24/22 0805   methylphenidate (RITALIN) tablet 10 mg  10 mg  Oral Daily Hill, Jackie Plum, MD   10 mg at 08/24/22 0806   polyethylene glycol (MIRALAX / GLYCOLAX) packet 17 g  17 g Oral Daily Massengill, Ovid Curd, MD   17 g at 08/24/22 0805   sertraline (ZOLOFT) tablet 100 mg  100 mg Oral Daily Massengill, Ovid Curd, MD   100 mg at 08/24/22 0932   vitamin D3 (CHOLECALCIFEROL) tablet 1,000 Units  1,000 Units Oral Daily Massengill, Ovid Curd, MD   1,000 Units at 08/24/22 0806   PTA Medications: Medications Prior to Admission  Medication Sig Dispense Refill Last Dose   ferrous sulfate 325 (65 FE) MG tablet Take 325 mg by mouth daily.      risperiDONE (RISPERDAL) 1 MG tablet Take 1 tablet (1 mg total) by mouth daily.      risperiDONE (RISPERDAL) 2 MG tablet Take 1 tablet (2 mg total) by mouth at bedtime.       Patient Stressors: Health problems   Marital or family conflict   Medication change or noncompliance    Patient Strengths: Average or above average intelligence  Supportive family/friends   Treatment Modalities: Medication Management, Group therapy, Case management,  1 to 1 session with clinician, Psychoeducation, Recreational therapy.   Physician Treatment Plan for Primary  Diagnosis: Schizoaffective disorder, depressive type (HCC) Long Term Goal(s): Improvement in symptoms so as ready for discharge   Short Term Goals: Ability to verbalize feelings will improve Ability to disclose and discuss suicidal ideas Ability to identify and develop effective coping behaviors will improve Compliance with prescribed medications will improve  Medication Management: Evaluate patient's response, side effects, and tolerance of medication regimen.  Therapeutic Interventions: 1 to 1 sessions, Unit Group sessions and Medication administration.  Evaluation of Outcomes: Progressing  Physician Treatment Plan for Secondary Diagnosis: Principal Problem:   Schizoaffective disorder, depressive type (HCC) Active Problems:   GAD (generalized anxiety disorder)    Vitamin D deficiency   Sialorrhea  Long Term Goal(s): Improvement in symptoms so as ready for discharge   Short Term Goals: Ability to verbalize feelings will improve Ability to disclose and discuss suicidal ideas Ability to identify and develop effective coping behaviors will improve Compliance with prescribed medications will improve     Medication Management: Evaluate patient's response, side effects, and tolerance of medication regimen.  Therapeutic Interventions: 1 to 1 sessions, Unit Group sessions and Medication administration.  Evaluation of Outcomes: Progressing   RN Treatment Plan for Primary Diagnosis: Schizoaffective disorder, depressive type (HCC) Long Term Goal(s): Knowledge of disease and therapeutic regimen to maintain health will improve  Short Term Goals: Ability to remain free from injury will improve, Ability to participate in decision making will improve, Ability to verbalize feelings will improve, Ability to disclose and discuss suicidal ideas, and Ability to identify and develop effective coping behaviors will improve  Medication Management: RN will administer medications as ordered by provider, will assess and evaluate patient's response and provide education to patient for prescribed medication. RN will report any adverse and/or side effects to prescribing provider.  Therapeutic Interventions: 1 on 1 counseling sessions, Psychoeducation, Medication administration, Evaluate responses to treatment, Monitor vital signs and CBGs as ordered, Perform/monitor CIWA, COWS, AIMS and Fall Risk screenings as ordered, Perform wound care treatments as ordered.  Evaluation of Outcomes: Progressing   LCSW Treatment Plan for Primary Diagnosis: Schizoaffective disorder, depressive type (HCC) Long Term Goal(s): Safe transition to appropriate next level of care at discharge, Engage patient in therapeutic group addressing interpersonal concerns.  Short Term Goals: Engage patient  in aftercare planning with referrals and resources, Increase social support, Increase emotional regulation, Facilitate acceptance of mental health diagnosis and concerns, Identify triggers associated with mental health/substance abuse issues, and Increase skills for wellness and recovery  Therapeutic Interventions: Assess for all discharge needs, 1 to 1 time with Social worker, Explore available resources and support systems, Assess for adequacy in community support network, Educate family and significant other(s) on suicide prevention, Complete Psychosocial Assessment, Interpersonal group therapy.  Evaluation of Outcomes: Progressing  Progress in Treatment: Attending groups: Yes. Participating in groups: Yes. Taking medication as prescribed: Yes. Toleration medication: Yes. Family/Significant other contact made: Yes, individual(s) contacted:  Abigail Teall [phone] (Mother) Patient understands diagnosis: Yes. Discussing patient identified problems/goals with staff: Yes. Medical problems stabilized or resolved: Yes. Denies suicidal/homicidal ideation: Yes. Issues/concerns per patient self-inventory: Yes. Other: none   New problem(s) identified: No, Describe:  none   New Short Term/Long Term Goal(s): Patient to work towards detox, elimination of symptoms of psychosis, medication management for mood stabilization; elimination of SI thoughts; development of comprehensive mental wellness/sobriety plan.   Patient Goals:  No additional goals identified at this time. Patient to continue to work towards original goals identified in initial treatment team meeting. CSW will remain available  to patient should they voice additional treatment goals.    Discharge Plan or Barriers: Patient lacks adequate housing at discharge; incapable of being discharged to shelter due to limited capacity. CSW to continue pursuing options and funding.    Reason for Continuation of Hospitalization: Other; describe  psychotic features    Estimated Length of Stay: TBD    Last 3 [place] Suicide Severity Risk Score: Flowsheet Row Ad[facility] (Current) [facility] from 05/18/2022 in [facility] Visit from 04/14/2022 in [facility]  C-SSRS RISK CATEGORY No Risk High Risk No Risk       Last PHQ 2/9 Scores:    04/14/2022   10:46 AM 02/24/2022   11:12 AM 01/13/2022    9:14 AM  Depression screen PHQ 2/9  Decreased Interest 0 0 0  Down, Depressed, Hopeless 0 0 0  PHQ - 2 Score 0 0 0    Scribe for Treatment Team: Aram Beecham, Theresia Majors 08/24/2022 10:44 AM

## 2022-08-24 NOTE — Progress Notes (Signed)
ADULT GRIEF GROUP NOTE:  Spiritual care group on grief and loss facilitated by chaplain Jerene Pitch MDiv, [facility]  Group Goal:  Support / Education around grief and loss Members engage in facilitated group support and psycho-social education.  Group Description:  Following introductions and group rules, group members engaged in facilitated group dialog and support around topic of loss, with particular support around experiences of loss in their lives. Group Identified types of loss (relationships / self / things) and identified patterns, circumstances, and changes that precipitate losses. Reflected on thoughts / feelings around loss, normalized grief responses, and recognized variety in grief experience.   Group noted Worden's four tasks of grief in discussion.  Group drew on Adlerian / Rogerian, narrative, MI, Patient Progress:  [place] was present throughout group.  Did not engage in group discussion.

## 2022-08-24 NOTE — Group Note (Signed)
Occupational Therapy Group Note  Group Topic:Coping Skills  Group Date: 08/24/2022 Start Time: 1400 End Time: 1440 Facilitators: Brantley Stage, OT   Group Description: Group encouraged increased engagement and participation through discussion and activity focused on "Coping Ahead." Patients were split up into teams and selected a card from a stack of positive coping strategies. Patients were instructed to act out/charade the coping skill for other peers to guess and receive points for their team. Discussion followed with a focus on identifying additional positive coping strategies and patients shared how they were going to cope ahead over the weekend while continuing hospitalization stay.  Therapeutic Goal(s): Identify positive vs negative coping strategies. Identify coping skills to be used during hospitalization vs coping skills outside of hospital/at home Increase participation in therapeutic group environment and promote engagement in treatment   Participation Level: Minimal   Participation Quality: Minimal Cues   Behavior: Calm and Cooperative   Speech/Thought Process: Barely audible   Affect/Mood: Appropriate   Insight: Limited   Judgement: Limited   Individualization: pt was passively engaged in their participation of group discussion/activity. New skills were identified  Modes of Intervention: Discussion and Education  Patient Response to Interventions:  Attentive   Plan: Continue to engage patient in OT groups 2 - 3x/week.  08/24/2022  Brantley Stage, OT Cornell Barman, OT

## 2022-08-24 NOTE — Progress Notes (Signed)
   08/24/22 2105  Psych Admission Type (Psych Patients Only)  Admission Status Voluntary  Psychosocial Assessment  Patient Complaints None  Eye Contact Brief  Facial Expression Other (Comment) (smiles, brightens with interactions)  Affect Anxious;Appropriate to circumstance  Speech Logical/coherent;Soft  Interaction Cautious;Forwards little;Minimal;Childlike  Motor Activity Other (Comment) (steady)  Appearance/Hygiene Unremarkable  Behavior Characteristics Cooperative;Appropriate to situation;Anxious;Calm  Mood Pleasant  Thought Process  Coherency WDL  Content WDL  Delusions Paranoid  Perception WDL  Hallucination None reported or observed  Judgment Limited  Confusion None  Danger to Self  Current suicidal ideation? Denies  Self-Injurious Behavior No self-injurious ideation or behavior indicators observed or expressed   Agreement Not to Harm Self Yes  Description of Agreement verbally contracts for safety  Danger to Others  Danger to Others None reported or observed
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
## 2022-08-24 NOTE — Progress Notes (Signed)
Adult Psychoeducational Group Note  Date:  08/24/2022 Time:  10:09 AM  Group Topic/Focus:  Goals Group:   The focus of this group is to help patients establish daily goals to achieve during treatment and discuss how the patient can incorporate goal setting into their daily lives to aide in recovery.  Participation Level:  Active  Participation Quality:  Appropriate  Affect:  Appropriate  Cognitive:  Appropriate  Insight: Appropriate  Engagement in Group:  Engaged  Modes of Intervention:  Discussion  Additional Comments:   Patient attended morning orientation group and participated.  Leilanni Halvorson W Fabio Wah 2/99/3716, 10:09 AM

## 2022-08-24 NOTE — BHH Counselor (Signed)
CSW spoke with Cheryl Adams (Guardian Ad Litem) [phone] who states that the MDE still needs to be scheduled and that the DSS Guardian, Mrs. Devoria Albe will be the one who needs to schedule the appointment with [facility] prior to the next court date on 09/29/2022.  CSW attempted to contact [name] on her cellphone [phone] but received no answer.  CSW left a voicemail asking for a return call on 08/24/2022 at 2:42pm.  CSW also attempted the office number [phone] but also received a voicemail at that number as well.  CSW left another voicemail at 3:06pm asking for a return call to discuss the MDE for the Pt.

## 2022-08-24 NOTE — Group Note (Signed)
LCSW Group Therapy Note   Group Date: 08/24/2022 Start Time: 1300 End Time: 1400  Type of Therapy and Topic:  Group Therapy:  Attachment Styles    Participation Level:  Active  Description of Group: This group addressed Attachment Styles that individual develop throughout there life. Patients shared about objects and people that they were attached to as children. Patients reflected how various relationship during their early development have influenced their behaviors in current relationships. Patients were provided worksheets and encouraged to read over the different ways they can begin to recognize their attachment styles and start to work towards a more secure and stable attachment with themselves and others. Therapeutic Goals Patient will discuss the 4 attachment styles Patient will demonstrate understanding of how these attachment styles can affect them and the people around them. Patient will verbalize how they believe these attachment styles affected them as both a child and adult. Patients will discuss the potential coping skills and ways that they can begin to work on a more secure attachment style.    Summary of Patient Progress:  The Pt attended group and remained there the entire time. The Pt accepted all worksheets that were provided and followed along with them throughout the group session.  The Pt was appropriate with their peers and was able to demonstrate understanding of the topic being discussed. The Pt was given a chance to ask questions and share their thoughts and feelings about the topic.    Therapeutic Modalities Cognitive Behavioral Therapy Motivational Interviewing  Darleen Crocker, [place] 08/24/2022  2:16 PM

## 2022-08-24 NOTE — Progress Notes (Signed)
Pt attended wrap-up group, but did not share anything. She still reports paranoia towards her family, but she will not elaborate any further. She denies any AVH. She denies feeling dizzy, lightheaded, or constipated. She reports that she did have a bowel movement today. Reports that her drooling has improved and is "not much." No visible drool noticed on her clothing. She has been calm and cooperative with care. Q 15 minute safety checks continue. [name] safety has been maintained.    08/23/22 2128  Psych Admission Type (Psych Patients Only)  Admission Status Voluntary  Psychosocial Assessment  Patient Complaints Nervousness  Eye Contact Brief;Avoids  Facial Expression Fixed smile  Affect Anxious;Appropriate to circumstance  Speech Logical/coherent;Soft  Interaction Childlike;Cautious;Forwards little  Motor Activity Other (Comment) (steady)  Appearance/Hygiene Improved  Behavior Characteristics Cooperative;Appropriate to situation;Anxious  Mood Anxious;Pleasant  Thought Process  Coherency WDL  Content WDL  Delusions Paranoid  Perception WDL  Hallucination None reported or observed  Judgment Limited  Confusion None  Danger to Self  Current suicidal ideation? Denies  Self-Injurious Behavior No self-injurious ideation or behavior indicators observed or expressed   Agreement Not to Harm Self Yes  Description of Agreement verbally contracts for safety  Danger to Others  Danger to Others None reported or observed

## 2022-08-24 NOTE — BHH Group Notes (Signed)
Pt did attend AA 

## 2022-08-24 NOTE — NC FL2 (Signed)
[place] LEVEL OF CARE SCREENING TOOL     IDENTIFICATION  Patient Name: Cheryl Adams Birthdate: July 25, 1991 Sex: female Admission Date (Current Location): 05/19/2022  [facility] and [place] Number:  Herbalist and Address:  [facility],  [place], [place]      Provider Number: [id number]  Attending Physician Name and Address:  Janine Limbo, MD  Relative Name and Phone Number:  Dortha, Neighbors [phone]    Current Level of Care: Hospital Recommended Level of Care: [facility] Prior Approval Number:    Date Approved/Denied:   PASRR Number:    Discharge Plan: Other (Comment) (Group Home)    Current Diagnoses: Patient Active Problem List   Diagnosis Date Noted   Sialorrhea 08/19/2022   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/06/2022   Vitamin D deficiency 05/24/2022   Schizoaffective disorder, depressive type ([place]) 05/20/2022   Aggressive behavior    Major depressive disorder, recurrent episode, moderate with anxious distress ([place]) 05/17/2021   GAD (generalized anxiety disorder) 05/17/2021   MDD (major depressive disorder), recurrent, with catatonic features ([place]) 04/28/2021   MDD (major depressive disorder), single episode with catatonic features 04/27/2021   Gastroesophageal reflux disease without esophagitis 02/03/2019   Pharyngoesophageal dysphagia 02/03/2019   Globus pharyngeus 07/17/2016   Laryngopharyngeal reflux (LPR) 07/17/2016   Odynophagia 07/07/2016   Iron deficiency anemia 02/26/2015   S/P tonsillectomy and adenoidectomy 02/26/2015   Healthcare maintenance 02/26/2015    Orientation RESPIRATION BLADDER Height & Weight     Self, Time, Situation, Place  Normal Continent Weight: 108.4 kg Height:  5\' 5"  (165.1 cm)  BEHAVIORAL SYMPTOMS/MOOD NEUROLOGICAL BOWEL NUTRITION STATUS      Continent    AMBULATORY STATUS COMMUNICATION OF NEEDS Skin   Independent Verbally Normal                        Personal Care Assistance Level of Assistance  Bathing Bathing Assistance: Limited assistance (Needs prompting)         Functional Limitations Info  Sight Sight Info: Impaired (Wears Glasses)        SPECIAL CARE FACTORS FREQUENCY  Blood pressure Blood Pressure Frequency: Takes maintaince medication                  Contractures Contractures Info: Not present    Additional Factors Info  Psychotropic     Psychotropic Info: Medications for Schizophrenia         Current Medications (08/24/2022):  This is the current hospital active medication list Current Facility-Administered Medications  Medication Dose Route Frequency Provider Last Rate Last Admin   acetaminophen (TYLENOL) tablet 500 mg  500 mg Oral Q6H PRN Massengill, Nathan, MD       amLODipine (NORVASC) tablet 10 mg  10 mg Oral Daily Massengill, Nathan, MD   10 mg at 08/24/22 0806   antiseptic oral rinse (BIOTENE) solution 15 mL  15 mL Mouth Rinse 5 X Daily PRN Harlow Asa, MD       atenolol (TENORMIN) tablet 25 mg  25 mg Oral Daily Massengill, Nathan, MD   25 mg at 08/24/22 0806   atropine 1 % ophthalmic solution 1 drop  1 drop Sublingual TID Janine Limbo, MD   1 drop at 08/24/22 0805   cloZAPine (CLOZARIL) tablet 200 mg  200 mg Oral QHS Nkwenti, Doris, NP   200 mg at 08/23/22 2128   docusate sodium (COLACE) capsule 100 mg  100 mg  Oral BID Phineas Inches, MD   100 mg at 08/24/22 6606   ferrous sulfate tablet 325 mg  325 mg Oral Daily Princess Bruins, DO   325 mg at 08/24/22 3016   hydrOXYzine (ATARAX) tablet 25 mg  25 mg Oral TID PRN Phineas Inches, MD   25 mg at 06/17/22 2027   OLANZapine zydis (ZYPREXA) disintegrating tablet 5 mg  5 mg Oral Q8H PRN Massengill, Harrold Donath, MD   5 mg at 05/24/22 2154   And   LORazepam (ATIVAN) tablet 1 mg  1 mg Oral Q8H PRN Massengill, Harrold Donath, MD   1 mg at 07/03/22 2150   And   ziprasidone (GEODON) injection 20 mg  20 mg Intramuscular Q8H PRN Massengill, Harrold Donath,  MD       metFORMIN (GLUCOPHAGE-XR) 24 hr tablet 500 mg  500 mg Oral Q breakfast Massengill, Nathan, MD   500 mg at 08/24/22 0805   methylphenidate (RITALIN) tablet 10 mg  10 mg Oral Daily Roselle Locus, MD   10 mg at 08/24/22 0806   polyethylene glycol (MIRALAX / GLYCOLAX) packet 17 g  17 g Oral Daily Massengill, Harrold Donath, MD   17 g at 08/24/22 0805   sertraline (ZOLOFT) tablet 100 mg  100 mg Oral Daily Massengill, Harrold Donath, MD   100 mg at 08/24/22 0109   vitamin D3 (CHOLECALCIFEROL) tablet 1,000 Units  1,000 Units Oral Daily Phineas Inches, MD   1,000 Units at 08/24/22 0806     Discharge Medications: Please see discharge summary for a list of discharge medications.  Relevant Imaging Results:  Relevant Lab Results:   Additional Information    Aram Beecham, LCSWA

## 2022-08-25 DIAGNOSIS — F251 Schizoaffective disorder, depressive type: Secondary | ICD-10-CM | POA: Diagnosis not present
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
## 2022-08-25 NOTE — Progress Notes (Signed)
   08/25/22 1036  Psych Admission Type (Psych Patients Only)  Admission Status Voluntary  Psychosocial Assessment  Patient Complaints None  Eye Contact Brief  Facial Expression Flat  Affect Appropriate to circumstance  Speech Logical/coherent  Interaction Forwards little  Motor Activity Other (Comment) (WNL)  Appearance/Hygiene Unremarkable  Behavior Characteristics Cooperative  Mood Pleasant  Thought Process  Coherency WDL  Content WDL  Delusions None reported or observed  Perception WDL  Hallucination None reported or observed  Judgment Limited  Confusion None  Danger to Self  Current suicidal ideation? Denies  Self-Injurious Behavior No self-injurious ideation or behavior indicators observed or expressed   Agreement Not to Harm Self Yes  Description of Agreement verbally contracts for safety  Danger to Others  Danger to Others None reported or observed

## 2022-08-25 NOTE — Group Note (Signed)
Recreation Therapy Group Note   Group Topic:Animal Assisted Therapy   Group Date: 08/25/2022 Start Time: 1430 End Time: 1515 Facilitators: Aldine Grainger-McCall, LRT,CTRS Location: [address] Day[address]   Animal-Assisted Activity (AAA) Program Checklist/Progress Notes Patient Eligibility Criteria Checklist & Daily Group note for Rec Tx Intervention  AAA/T Program Assumption of Risk Form signed by Patient/ or Parent Legal Guardian Yes  Patient understands his/her participation is voluntary Yes   Affect/Mood: N/A   Participation Level: Did not attend    Clinical Observations/Individualized Feedback:     Plan: Continue to engage patient in RT group sessions 2-3x/week.   Court Gracia-McCall, LRT,CTRS 08/25/2022 3:53 PM

## 2022-08-25 NOTE — BHH Counselor (Signed)
CSW spoke with [facility] [phone] who states that [facility] has declined completing the MDE virtually and is requesting that the Pt be transported there for the appointment.  Mr. Marvel Plan is requesting that if the Pt cannot come to [facility] from the hospital then he would like the hospital to complete son form of "Competency testing".  He states that he needs a doctor to state that the Pt is either "Competent or Incompetent".  CSW informed Mr. Marvel Plan that this was not something that is done that this [facility] facility.  He states that he "I usually write a letter to a doctor at a hospital and they put their recommendation on it.  I have never had this type of problem before getting a doctor to state if a person is competent or incompetent".  CSW again stated that this was not something this facility would be able to do but that she would speak with her supervisor and other Administration to see what can be done.  He states that he has also requested medical records from the [facility] Records Department and states that he has not received them yet.   CSW attempted to contact Devoria Albe at [phone] (10:24pm and 11:30am) and at [phone] (10:25am). CSW received no answer.  CSW left a voicemail asking for a return call to discuss the MDE.

## 2022-08-25 NOTE — Plan of Care (Signed)
  Problem: Activity: Goal: Interest or engagement in activities will improve Outcome: Progressing   Problem: Coping: Goal: Ability to verbalize frustrations and anger appropriately will improve Outcome: Progressing   Problem: Safety: Goal: Periods of time without injury will increase Outcome: Progressing   

## 2022-08-25 NOTE — Progress Notes (Signed)
The patient rated her day as a 4 out of 10. Her positive event for the day is that the meals were good. Her coping skill will be breathing exercises.

## 2022-08-25 NOTE — Group Note (Signed)
Date:  08/25/2022 Time:  1:24 PM  Group Topic/Focus:   Orientation:   The focus of this group is to educate the patient on the purpose and policies of crisis stabilization and provide a format to answer questions about their admission.  The group details unit policies and expectations of patients while admitted.   Participation Level:  Minimal  Participation Quality:  Inattentive  Affect:  Appropriate  Cognitive:  Lacking  Insight: Limited  Engagement in Group:  Lacking  Modes of Intervention:  Discussion  Additional Comments:     Jerrye Beavers 08/25/2022, 1:24 PM

## 2022-08-26 DIAGNOSIS — F251 Schizoaffective disorder, depressive type: Secondary | ICD-10-CM | POA: Diagnosis not present

## 2022-08-26 NOTE — Progress Notes (Signed)
   08/25/22 2000  Psychosocial Assessment  Patient Complaints Depression;Anxiety (Rates anxiety a  4 # /Depression 4#)  Eye Contact Brief  Facial Expression Flat  Affect Anxious;Depressed  Speech Logical/coherent  Interaction Poor;Minimal  Motor Activity Slow  Appearance/Hygiene Unremarkable  Behavior Characteristics Cooperative  Mood Depressed;Pleasant  Thought Process  Coherency WDL  Content WDL  Delusions None reported or observed  Perception WDL  Hallucination None reported or observed  Judgment Limited  Confusion None  Danger to Self  Current suicidal ideation? Denies  Danger to Others  Danger to Others None reported or observed

## 2022-08-26 NOTE — Plan of Care (Signed)
  Problem: Education: Goal: Verbalization of understanding the information provided will improve Outcome: Progressing   Problem: Health Behavior/Discharge Planning: Goal: Compliance with treatment plan for underlying cause of condition will improve Outcome: Progressing   

## 2022-08-26 NOTE — Progress Notes (Signed)
Pt denies SI/HI/AVH. Pt reports 4/10 depression and 4/10 anxiety. RN provided support and encouragement to patient. Pt given scheduled medications as prescribed. Q15 min checks verified for safety. RN will continue to monitor pt's progress and provide assistance as indicated. Pt is safe on the unit. Will continue to monitor.   08/26/22 1110  Psychosocial Assessment  Patient Complaints Depression;Anxiety  Eye Contact Brief  Facial Expression Flat;Sad  Affect Depressed  Speech Logical/coherent  Interaction Minimal  Motor Activity Slow  Appearance/Hygiene Unremarkable  Behavior Characteristics Cooperative;Appropriate to situation  Mood Depressed;Sad  Thought Process  Coherency WDL  Content WDL  Delusions None reported or observed  Perception WDL  Hallucination None reported or observed  Judgment Limited  Confusion None  Danger to Self  Current suicidal ideation? Denies  Danger to Others  Danger to Others None reported or observed

## 2022-08-26 NOTE — Group Note (Signed)
Recreation Therapy Group Note   Group Topic:Team Building  Group Date: 08/26/2022 Start Time: 0935 End Time: 1010 Facilitators: Taveon Enyeart-McCall, LRT,CTRS Location: [address] Day[address]   Goal Area(s) Addresses:  Patient will effectively work with peer towards shared goal.  Patient will identify skills used to make activity successful.  Patient will identify how skills used during activity can be used to reach post d/c goals.   Group Description: Straw Bridge. In teams of 3-5, patients were given 15 plastic drinking straws and an equal length of masking tape. Using the materials provided, patients were instructed to build a free standing bridge-like structure to suspend an everyday item (ex: puzzle box) off of the floor or table surface. All materials were required to be used by the team in their design. LRT facilitated post-activity discussion reviewing team process. Patients were encouraged to reflect how the skills used in this activity can be generalized to daily life post discharge.   Affect/Mood: Appropriate   Participation Level: Engaged   Participation Quality: Independent   Behavior: Appropriate   Speech/Thought Process: Focused   Insight: Good   Judgement: Good   Modes of Intervention: STEM Activity   Patient Response to Interventions:  Engaged   Education Outcome:  Acknowledges education and In group clarification offered    Clinical Observations/Individualized Feedback: Pt was quiet but engaged.  Pt took the lead in constructing the bridge for her group.  Pt ended up working mostly alone due to peers being called out of group to meet with providers.     Plan: Continue to engage patient in RT group sessions 2-3x/week.   Stormie Ventola-McCall, LRT,CTRS 08/26/2022 12:34 PM

## 2022-08-26 NOTE — BHH Counselor (Signed)
CSW spoke with the [name] DSS Guardian, Devoria Albe [phone] and informed her that the Pt has been approved by the hospital to discharge into the custody of [name] to attend the MDE at [facility] and can return to the hospital on the same day to be readmitted until her 09/29/2022 court date.  During the MDE the Pt will need to remain in the custody of the DSS Guardian, Devoria Albe.  Mrs. Ouida Sills called the CSW back and stated that she has reached out to the [facility] and is waiting for them to schedule the MDE for the Pt.  She states that she will call the CSW once a date has been confirmed. Mrs. Ouida Sills states that she has no current concerns about transporting the Pt in the car to and from the MDE.  Mrs. Ouida Sills thanked the CSW for getting approval for the Pt to leave the hospital to attend the MDE.  CSW will follow-up with [name] next week on the date for the MDE and about the pending Medicaid application.

## 2022-08-26 NOTE — Group Note (Signed)
Recreation Therapy Group Note   Group Topic:Other  Group Date: 08/26/2022 Start Time: 4481 End Time: 1430 Facilitators: Argie Applegate-McCall, LRT,CTRS Location: 300 Hall Dayroom   Activity Description/Intervention: Therapeutic Drumming. Patients, with peers and staff, were given the opportunity to engage in a leader facilitated Silt with staff from the [employer], in partnership with [employer].    Affect/Mood: Appropriate   Participation Level: Engaged   Participation Quality: Independent   Behavior: Appropriate   Speech/Thought Process: Focused   Insight: Good   Judgement: Good   Modes of Intervention: Music   Patient Response to Interventions:  Engaged   Education Outcome:  Acknowledges education and In group clarification offered    Clinical Observations/Individualized Feedback: Pt was quiet but appeared to be bright and was engaged.  Pt was focused throughout drumming group.     Plan: Continue to engage patient in RT group sessions 2-3x/week.   Kaheem Halleck-McCall, LRT,CTRS  08/26/2022 4:01 PM

## 2022-08-26 NOTE — Progress Notes (Signed)
   08/26/22 2215  Psych Admission Type (Psych Patients Only)  Admission Status Voluntary  Psychosocial Assessment  Patient Complaints Anxiety;Depression  Eye Contact Brief  Facial Expression Flat;Sad  Affect Depressed  Speech Logical/coherent  Interaction Childlike;Cautious;Guarded  Motor Activity Slow  Appearance/Hygiene Disheveled  Behavior Characteristics Cooperative  Mood Depressed;Sad  Aggressive Behavior  Effect No apparent injury  Thought Process  Coherency WDL  Content WDL  Delusions WDL  Perception WDL  Hallucination None reported or observed  Judgment WDL  Confusion None  Danger to Self  Current suicidal ideation? Denies  Danger to Others  Danger to Others None reported or observed
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
## 2022-08-26 NOTE — Group Note (Signed)
LCSW Group Therapy Note   Group Date: 08/26/2022 Start Time: 1300 End Time: 1400  Type of Therapy and Topic:  Group Therapy - Healthy vs Unhealthy Coping Skills  Participation Level:  Active   Description of Group The focus of this group was to determine what unhealthy coping techniques typically are used by group members and what healthy coping techniques would be helpful in coping with various problems. Patients were guided in becoming aware of the differences between healthy and unhealthy coping techniques. Patients were asked to identify 2-3 healthy coping skills they would like to learn to use more effectively.  Therapeutic Goals Patients learned that coping is what human beings do all day long to deal with various situations in their lives Patients defined and discussed healthy vs unhealthy coping techniques Patients identified their preferred coping techniques and identified whether these were healthy or unhealthy Patients determined 2-3 healthy coping skills they would like to become more familiar with and use more often. Patients provided support and ideas to each other   Summary of Patient Progress:  During group, the Pt expressed that positive affirmations is a coping skill that helps them. Patient proved open to input from peers and feedback from [place]. Patient demonstrated insight into the subject matter, was respectful of peers, and participated throughout the entire session.   Therapeutic Modalities Cognitive Behavioral Therapy Motivational Interviewing  Darleen Crocker, [place] 08/26/2022  1:54 PM

## 2022-08-27 DIAGNOSIS — F251 Schizoaffective disorder, depressive type: Secondary | ICD-10-CM | POA: Diagnosis not present

## 2022-08-27 NOTE — Plan of Care (Signed)
  Problem: Education: Goal: Knowledge of [facility] Education information/materials will improve Outcome: Progressing Goal: Emotional status will improve Outcome: Progressing Goal: Mental status will improve Outcome: Progressing Goal: Verbalization of understanding the information provided will improve Outcome: Progressing   Problem: Activity: Goal: Interest or engagement in activities will improve Outcome: Progressing Goal: Sleeping patterns will improve Outcome: Progressing   Problem: Coping: Goal: Ability to verbalize frustrations and anger appropriately will improve Outcome: Progressing Goal: Ability to demonstrate self-control will improve Outcome: Progressing   Problem: Health Behavior/Discharge Planning: Goal: Identification of resources available to assist in meeting health care needs will improve Outcome: Progressing Goal: Compliance with treatment plan for underlying cause of condition will improve Outcome: Progressing   Problem: Physical Regulation: Goal: Ability to maintain clinical measurements within normal limits will improve Outcome: Progressing   Problem: Safety: Goal: Periods of time without injury will increase Outcome: Progressing   Problem: Activity: Goal: Interest or engagement in leisure activities will improve Outcome: Progressing Goal: Imbalance in normal sleep/wake cycle will improve Outcome: Progressing   Problem: Coping: Goal: Coping ability will improve Outcome: Progressing Goal: Will verbalize feelings Outcome: Progressing   Problem: Coping: Goal: Ability to identify and develop effective coping behavior will improve Outcome: Progressing   Problem: Education: Goal: Will be free of psychotic symptoms Outcome: Progressing Goal: Knowledge of the prescribed therapeutic regimen will improve Outcome: Progressing   Problem: Coping: Goal: Coping ability will improve Outcome: Progressing Goal: Will verbalize feelings Outcome:  Progressing   Problem: Nutritional: Goal: Ability to achieve adequate nutritional intake will improve Outcome: Progressing   Problem: Role Relationship: Goal: Ability to communicate needs accurately will improve Outcome: Progressing Goal: Ability to interact with others will improve Outcome: Progressing   Problem: Safety: Goal: Ability to redirect hostility and anger into socially appropriate behaviors will improve Outcome: Progressing Goal: Ability to remain free from injury will improve Outcome: Progressing
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
## 2022-08-27 NOTE — Progress Notes (Signed)
   08/27/22 0530  Sleep  Number of Hours 6.75

## 2022-08-27 NOTE — Group Note (Signed)
Date:  08/27/2022 Time:  5:05 PM  Group Topic/Focus:  Wellness Toolbox:   The focus of this group is to discuss various aspects of wellness, balancing those aspects and exploring ways to increase the ability to experience wellness.  Patients will create a wellness toolbox for use upon discharge.    Participation Level:  None  Participation Quality:  Inattentive  Affect:  Flat  Cognitive:  Lacking  Insight: Lacking  Engagement in Group:  Lacking  Modes of Intervention:  Education  Additional Comments:     Jerrye Beavers 08/27/2022, 5:05 PM

## 2022-08-27 NOTE — Progress Notes (Signed)
Ritalin pill was dropped in computer keyboard had to pull another out of pyxis, witnessed by [employer]. RN could not remove pill that fell in keyboard to waste. Pt remains safe on Q15 min checks and contracts for safety.

## 2022-08-27 NOTE — Group Note (Unsigned)
Date:  08/27/2022 Time:  11:48 AM  Group Topic/Focus:  Orientation:   The focus of this group is to educate the patient on the purpose and policies of crisis stabilization and provide a format to answer questions about their admission.  The group details unit policies and expectations of patients while admitted.     Participation Level:  {BHH PARTICIPATION HTXHF:41423}  Participation Quality:  {BHH PARTICIPATION QUALITY:22265}  Affect:  {BHH AFFECT:22266}  Cognitive:  {BHH COGNITIVE:22267}  Insight: {BHH Insight2:20797}  Engagement in Group:  {BHH ENGAGEMENT IN TRVUY:23343}  Modes of Intervention:  {BHH MODES OF INTERVENTION:22269}  Additional Comments:  ***  Jerrye Beavers 08/27/2022, 11:48 AM

## 2022-08-27 NOTE — BHH Counselor (Signed)
CSW spoke with Mrs. Devoria Albe who states that the Pt has been scheduled for the MDE with [facility] Sessions on October 12th, 2023 at 8:30am.  The Pt will need to be ready to discharge from [facility] at 7:30am to the custody of her DSS Guardian, Devoria Albe.  She will return to [facility] and be readmitted on the same day.

## 2022-08-27 NOTE — Progress Notes (Signed)
Patient appears pleasant. Patient denies SI/HI/AVH. Pt reports good sleep and appetite.  Patient complied with morning medication with no reported side effects. Patient remains safe on Q69min checks and contracts for safety.     08/27/22 0900  Psych Admission Type (Psych Patients Only)  Admission Status Voluntary  Psychosocial Assessment  Patient Complaints Depression;Anxiety  Eye Contact Brief  Facial Expression Flat  Affect Depressed  Speech Logical/coherent  Interaction Guarded  Motor Activity Slow  Appearance/Hygiene Unremarkable  Behavior Characteristics Cooperative  Mood Depressed  Aggressive Behavior  Effect No apparent injury  Thought Process  Coherency WDL  Content WDL  Delusions None reported or observed  Perception WDL  Hallucination None reported or observed  Judgment WDL  Confusion None  Danger to Self  Current suicidal ideation? Denies  Danger to Others  Danger to Others None reported or observed

## 2022-08-27 NOTE — Progress Notes (Signed)
The patient rated her day as a 4 out of 10. Her positive event for the day is that she went outside for fresh air.

## 2022-08-27 NOTE — Progress Notes (Signed)
   08/27/22 2015  Psych Admission Type (Psych Patients Only)  Admission Status Voluntary  Psychosocial Assessment  Patient Complaints Depression  Eye Contact Brief  Facial Expression Flat;Sad  Affect Depressed  Speech Logical/coherent  Interaction Childlike;Cautious;Guarded  Motor Activity Slow  Appearance/Hygiene Disheveled  Behavior Characteristics Cooperative  Mood Depressed  Aggressive Behavior  Effect No apparent injury  Thought Process  Coherency WDL  Content WDL  Delusions WDL  Perception WDL  Hallucination None reported or observed  Judgment WDL  Confusion None  Danger to Self  Current suicidal ideation? Denies  Danger to Others  Danger to Others None reported or observed

## 2022-08-28 ENCOUNTER — Encounter (HOSPITAL_COMMUNITY): Payer: Self-pay

## 2022-08-28 DIAGNOSIS — F251 Schizoaffective disorder, depressive type: Secondary | ICD-10-CM | POA: Diagnosis not present

## 2022-08-28 MED ORDER — ATENOLOL 12.5 MG HALF TABLET
12.5000 mg | ORAL_TABLET | Freq: Every day | ORAL | Status: DC
  Administered 2022-08-30 – 2022-09-01 (×3): 12.5 mg via ORAL
  Filled 2022-08-28 (×5): qty 1

## 2022-08-28 NOTE — BH IP Treatment Plan (Signed)
Interdisciplinary Treatment and Diagnostic Plan Update  08/28/2022 Time of Session: 0830 Cheryl Adams MRN: [id number]  Principal Diagnosis: Schizoaffective disorder, depressive type [facility])  Secondary Diagnoses: Principal Problem:   Schizoaffective disorder, depressive type (HCC) Active Problems:   GAD (generalized anxiety disorder)   Vitamin D deficiency   Sialorrhea   Current Medications:  Current Facility-Administered Medications  Medication Dose Route Frequency Provider Last Rate Last Admin   acetaminophen (TYLENOL) tablet 500 mg  500 mg Oral Q6H PRN Massengill, Harrold Donath, MD       amLODipine (NORVASC) tablet 10 mg  10 mg Oral Daily Massengill, Harrold Donath, MD   10 mg at 08/28/22 6599   antiseptic oral rinse (BIOTENE) solution 15 mL  15 mL Mouth Rinse 5 X Daily PRN Comer Locket, MD       [START ON 08/29/2022] atenolol (TENORMIN) tablet 12.5 mg  12.5 mg Oral Daily Massengill, Nathan, MD       atropine 1 % ophthalmic solution 1 drop  1 drop Sublingual TID Phineas Inches, MD   1 drop at 08/28/22 0837   cloZAPine (CLOZARIL) tablet 200 mg  200 mg Oral QHS Nkwenti, Doris, NP   200 mg at 08/27/22 2108   docusate sodium (COLACE) capsule 100 mg  100 mg Oral BID Massengill, Harrold Donath, MD   100 mg at 08/28/22 3570   ferrous sulfate tablet 325 mg  325 mg Oral Daily Princess Bruins, DO   325 mg at 08/28/22 1779   hydrOXYzine (ATARAX) tablet 25 mg  25 mg Oral TID PRN Phineas Inches, MD   25 mg at 06/17/22 2027   OLANZapine zydis (ZYPREXA) disintegrating tablet 5 mg  5 mg Oral Q8H PRN Massengill, Harrold Donath, MD   5 mg at 05/24/22 2154   And   LORazepam (ATIVAN) tablet 1 mg  1 mg Oral Q8H PRN Massengill, Harrold Donath, MD   1 mg at 07/03/22 2150   And   ziprasidone (GEODON) injection 20 mg  20 mg Intramuscular Q8H PRN Massengill, Harrold Donath, MD       metFORMIN (GLUCOPHAGE-XR) 24 hr tablet 500 mg  500 mg Oral Q breakfast Massengill, Nathan, MD   500 mg at 08/28/22 0836   methylphenidate (RITALIN) tablet 10 mg  10  mg Oral Daily Hill, Shelbie Hutching, MD   10 mg at 08/28/22 0836   polyethylene glycol (MIRALAX / GLYCOLAX) packet 17 g  17 g Oral Daily Massengill, Harrold Donath, MD   17 g at 08/28/22 0836   sertraline (ZOLOFT) tablet 100 mg  100 mg Oral Daily Massengill, Harrold Donath, MD   100 mg at 08/28/22 3903   vitamin D3 (CHOLECALCIFEROL) tablet 1,000 Units  1,000 Units Oral Daily Massengill, Harrold Donath, MD   1,000 Units at 08/28/22 0092   PTA Medications: Medications Prior to Admission  Medication Sig Dispense Refill Last Dose   ferrous sulfate 325 (65 FE) MG tablet Take 325 mg by mouth daily.      risperiDONE (RISPERDAL) 1 MG tablet Take 1 tablet (1 mg total) by mouth daily.      risperiDONE (RISPERDAL) 2 MG tablet Take 1 tablet (2 mg total) by mouth at bedtime.       Patient Stressors: Health problems   Marital or family conflict   Medication change or noncompliance    Patient Strengths: Average or above average intelligence  Supportive family/friends   Treatment Modalities: Medication Management, Group therapy, Case management,  1 to 1 session with clinician, Psychoeducation, Recreational therapy.   Physician Treatment Plan for Primary Diagnosis:  Schizoaffective disorder, depressive type (HCC) Long Term Goal(s): Improvement in symptoms so as ready for discharge   Short Term Goals: Ability to verbalize feelings will improve Ability to disclose and discuss suicidal ideas Ability to identify and develop effective coping behaviors will improve Compliance with prescribed medications will improve  Medication Management: Evaluate patient's response, side effects, and tolerance of medication regimen.  Therapeutic Interventions: 1 to 1 sessions, Unit Group sessions and Medication administration.  Evaluation of Outcomes: Progressing  Physician Treatment Plan for Secondary Diagnosis: Principal Problem:   Schizoaffective disorder, depressive type (HCC) Active Problems:   GAD (generalized anxiety disorder)    Vitamin D deficiency   Sialorrhea  Long Term Goal(s): Improvement in symptoms so as ready for discharge   Short Term Goals: Ability to verbalize feelings will improve Ability to disclose and discuss suicidal ideas Ability to identify and develop effective coping behaviors will improve Compliance with prescribed medications will improve     Medication Management: Evaluate patient's response, side effects, and tolerance of medication regimen.  Therapeutic Interventions: 1 to 1 sessions, Unit Group sessions and Medication administration.  Evaluation of Outcomes: Progressing   RN Treatment Plan for Primary Diagnosis: Schizoaffective disorder, depressive type (HCC) Long Term Goal(s): Knowledge of disease and therapeutic regimen to maintain health will improve  Short Term Goals: Ability to remain free from injury will improve, Ability to verbalize frustration and anger appropriately will improve, Ability to demonstrate self-control, Ability to participate in decision making will improve, Ability to verbalize feelings will improve, Ability to disclose and discuss suicidal ideas, Ability to identify and develop effective coping behaviors will improve, and Compliance with prescribed medications will improve  Medication Management: RN will administer medications as ordered by provider, will assess and evaluate patient's response and provide education to patient for prescribed medication. RN will report any adverse and/or side effects to prescribing provider.  Therapeutic Interventions: 1 on 1 counseling sessions, Psychoeducation, Medication administration, Evaluate responses to treatment, Monitor vital signs and CBGs as ordered, Perform/monitor CIWA, COWS, AIMS and Fall Risk screenings as ordered, Perform wound care treatments as ordered.  Evaluation of Outcomes: Progressing   LCSW Treatment Plan for Primary Diagnosis: Schizoaffective disorder, depressive type (HCC) Long Term Goal(s): Safe  transition to appropriate next level of care at discharge, Engage patient in therapeutic group addressing interpersonal concerns.  Short Term Goals: Engage patient in aftercare planning with referrals and resources, Increase social support, Increase ability to appropriately verbalize feelings, Increase emotional regulation, Facilitate acceptance of mental health diagnosis and concerns, Facilitate patient progression through stages of change regarding substance use diagnoses and concerns, Identify triggers associated with mental health/substance abuse issues, and Increase skills for wellness and recovery  Therapeutic Interventions: Assess for all discharge needs, 1 to 1 time with Social worker, Explore available resources and support systems, Assess for adequacy in community support network, Educate family and significant other(s) on suicide prevention, Complete Psychosocial Assessment, Interpersonal group therapy.  Evaluation of Outcomes: Progressing   Progress in Treatment: Attending groups: Yes. Participating in groups: Yes. Taking medication as prescribed: Yes. Toleration medication: Yes. Family/Significant other contact made: Yes, individual(s) contacted:   Terri Rorrer [phone] (Mother) Patient understands diagnosis: Yes. Discussing patient identified problems/goals with staff: Yes. Medical problems stabilized or resolved: Yes. Denies suicidal/homicidal ideation: Yes. Issues/concerns per patient self-inventory: Yes. Other: none  New problem(s) identified: No, Describe:  none  New Short Term/Long Term Goal(s): Patient to work towards elimination of symptoms of psychosis, medication management for mood stabilization; development of comprehensive mental  wellness plan  Patient Goals:  No additional goals identified at this time. Patient to continue to work towards original goals identified in initial treatment team meeting. CSW will remain available to patient should they voice  additional treatment goals.   Discharge Plan or Barriers: Patient continues to be a placement issue. CSW pursuing multiple options for discharge.   Reason for Continuation of Hospitalization: Other; describe boarding  Estimated Length of Stay: TBD   Last [place] Suicide Severity Risk Score: [place] Admission (Current) from 05/19/2022 in [place] [facility] ED from 05/18/2022 in [facility] Office Visit from 04/14/2022 in [place] No Risk High Risk No Risk        Scribe for Treatment Team: Larose Kells 08/28/2022 10:34 AM

## 2022-08-28 NOTE — Progress Notes (Signed)
   08/28/22 2015  Psych Admission Type (Psych Patients Only)  Admission Status Voluntary  Psychosocial Assessment  Patient Complaints None  Eye Contact Brief  Facial Expression Flat;Sad  Affect Depressed  Speech Logical/coherent  Interaction Childlike;Cautious;Guarded  Motor Activity Slow  Appearance/Hygiene Disheveled  Behavior Characteristics Cooperative  Mood Preoccupied;Pleasant  Aggressive Behavior  Effect No apparent injury  Thought Process  Coherency WDL  Content WDL  Delusions WDL  Perception WDL  Hallucination None reported or observed  Judgment WDL  Confusion None  Danger to Self  Current suicidal ideation? Denies  Danger to Others  Danger to Others None reported or observed
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
## 2022-08-28 NOTE — Progress Notes (Signed)
   08/28/22 0500  Sleep  Number of Hours 7.25    

## 2022-08-28 NOTE — Group Note (Signed)
Date:  08/28/2022 Time:  7:00 PM  Group Topic/Focus:  Dimensions of Wellness:   The focus of this group is to introduce the topic of wellness and discuss the role each dimension of wellness plays in total health.    Participation Level:  None  Participation Quality:  Resistant  Affect:  Blunted  Cognitive:  Lacking  Insight: Limited  Engagement in Group:  None  Modes of Intervention:  Discussion  Additional Comments:   Patient did not engage, although she set through group.  Jerrye Beavers 08/28/2022, 7:00 PM

## 2022-08-28 NOTE — Group Note (Signed)
Recreation Therapy Group Note   Group Topic:Healthy Decision Making  Group Date: 08/28/2022 Start Time: 0930 End Time: 1000 Facilitators: Deyja Sochacki-McCall, LRT,CTRS Location: [address] Day[address]   Goal Area(s) Addresses:  Patient will effectively work with peer towards shared goal.  Patient will identify factors that guided their decision making.  Patient will pro-socially communicate ideas during group session.   Group Description:  Patients were given a scenario that they were going to be stranded on a deserted [place] for several months before being rescued. Writer tasked them with making a list of 15 things they would choose to bring with them for "survival". The list of items was prioritized most important to least. Each patient would come up with their own list, then work together to create a new list of 15 items while in a group of 3-5 peers. LRT discussed each person's list and how it differed from others. The debrief included discussion of priorities, good decisions versus bad decisions, and how it is important to think before acting so we can make the best decision possible. LRT tied the concept of effective communication among group members to patient's support systems outside of the hospital and its benefit post discharge.   Affect/Mood: Drowsy   Participation Level: None   Participation Quality: None   Behavior: Sleep   Speech/Thought Process: N/A   Insight: N/A   Judgement: N/A   Modes of Intervention: Activity   Patient Response to Interventions:  N/A   Education Outcome:  Acknowledges education and In group clarification offered    Clinical Observations/Individualized Feedback: Pt was slept during group session.    Plan: Continue to engage patient in RT group sessions 2-3x/week.   Cheryl Adams, LRT,CTRS 08/28/2022 1:10 PM

## 2022-08-28 NOTE — Group Note (Signed)
LCSW Group Therapy Note   Group Date: 08/28/2022 Start Time: 1300 End Time: 1400  Type of Therapy and Topic:  Group Therapy:  Stress Management   Participation Level:  Active    Description of Group:  Patients in this group were introduced to the idea of stress and encouraged to discuss negative and positive ways to manage stress. Patients discussed specific stressors that they have in their life right now and the physical signs and symptoms associated with that stress.  Patient encouraged to come up with positive changes to assist with the stress upon discharge in order to prevent future hospitalizations.   They also worked as a group on developing a specific plan for several patients to deal with stressors through boundary-setting, psychoeducation and self-care techniques   Therapeutic Goals:               1)  To discuss the positive and negative impacts of stress             2)  identify signs and symptoms of stress             3)  generate ideas for stress management             4)  offer mutual support to others regarding stress management             5)  Developing plans for ways to manage specific stressors upon discharge               Summary of Patient Progress: The Pt attended group and remained there the entire time.  The Pt accepted all worksheets and participated in the group discussion.  The Pt was appropriate with their peers and was able to share examples of stress in their own life.  The Pt was able to find new ways of managing their stress and having a different perspective about things that cause them stress.    Therapeutic Modalities:   Motivational Interviewing Brief Solution-Focused Therapy  Darleen Crocker, Latanya Presser 08/28/2022  1:55 PM

## 2022-08-29 DIAGNOSIS — F251 Schizoaffective disorder, depressive type: Secondary | ICD-10-CM | POA: Diagnosis not present

## 2022-08-29 NOTE — Progress Notes (Signed)
   08/29/22 0500  Sleep  Number of Hours 7

## 2022-08-29 NOTE — Progress Notes (Signed)
   08/29/22 0900  Psych Admission Type (Psych Patients Only)  Admission Status Voluntary  Psychosocial Assessment  Patient Complaints None  Eye Contact Brief  Facial Expression Fixed smile  Affect Appropriate to circumstance  Speech Logical/coherent  Interaction Childlike;Cautious  Motor Activity Slow  Appearance/Hygiene Layered clothes  Behavior Characteristics Cooperative  Mood Pleasant  Aggressive Behavior  Effect No apparent injury  Thought Process  Coherency WDL  Content WDL  Delusions WDL  Perception WDL  Hallucination None reported or observed  Judgment WDL  Confusion None  Danger to Self  Current suicidal ideation? Denies  Self-Injurious Behavior No self-injurious ideation or behavior indicators observed or expressed   Agreement Not to Harm Self Yes  Description of Agreement Verbal  Danger to Others  Danger to Others None reported or observed

## 2022-08-29 NOTE — Group Note (Signed)
LCSW Group Therapy Note  08/29/2022    10:00-11:00am   Type of Therapy and Topic:  Group Therapy: Early Messages Received About Anger  Participation Level:  Minimal   Description of Group:   In this group, patients shared and discussed the early messages received in their lives about anger through parental or other adult modeling, teaching, repression, punishment, violence, and more.  Participants identified how those childhood lessons influence even now how they usually or often react when angered.  The group discussed that anger is a secondary emotion and what may be the underlying emotional themes that come out through anger outbursts or that are ignored through anger suppression.    Therapeutic Goals: Patients will identify one or more childhood message about anger that they received and how it was taught to them. Patients will discuss how these childhood experiences have influenced and continue to influence their own expression or repression of anger even today. Patients will explore possible primary emotions that tend to fuel their secondary emotion of anger. Patients will learn that anger itself is normal and cannot be eliminated, and that healthier coping skills can assist with resolving conflict rather than worsening situations.  Summary of Patient Progress:  The patient did not wish to share during group, but attended and listened to others process.  Therapeutic Modalities:   Cognitive Behavioral Therapy Motivation [place], [place] 08/29/2022 11:37 AM  08/29/2022  11:37 AM
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
## 2022-08-29 NOTE — [facility] Group Notes (Signed)
[facility] Group Notes:  (Nursing/MHT/Case Management/Adjunct)  Date:  08/29/2022  Time:  10:21 AM  Type of Therapy:   Goals/ Orientation   Participation Level:  Minimal  Participation Quality:  Drowsy  Affect:   sleeping   Cognitive:   n/a  Insight:  None  Engagement in Group:      Modes of Intervention:  Education  Summary of Progress/Problems:pt. Atttend group but was sleeping through group.   Bertram Savin 08/29/2022, 10:21 AM

## 2022-08-29 NOTE — [facility] Group Notes (Signed)
[facility] Group Notes:  (Nursing/MHT/Case Management/Adjunct)  Date:  08/29/2022  Time:  10:25 PM  Type of Therapy:  Psychoeducational Skills  Participation Level:  Active  Participation Quality:  Appropriate and Sharing  Affect:  Appropriate  Cognitive:  Alert, Appropriate, and Oriented  Insight:  Appropriate and Good  Engagement in Group:  Engaged  Modes of Intervention:  Discussion  Summary of Progress/Problems: Pt. Shared how the meals on today was great and it allow her to have a great day today. She is looking to have a better day on the following day.    Cheryl Adams [place] 08/29/2022, 10:25 PM

## 2022-08-29 NOTE — Progress Notes (Signed)
   08/29/22 2137  Psych Admission Type (Psych Patients Only)  Admission Status Voluntary  Psychosocial Assessment  Patient Complaints None  Eye Contact Brief  Facial Expression Fixed smile  Affect Appropriate to circumstance  Speech Logical/coherent;Soft  Interaction Childlike  Motor Activity Slow  Appearance/Hygiene Layered clothes  Behavior Characteristics Appropriate to situation  Mood Pleasant  Thought Process  Coherency WDL  Content WDL  Delusions None reported or observed  Perception WDL  Hallucination None reported or observed  Judgment WDL  Confusion None  Danger to Self  Current suicidal ideation? Denies  Self-Injurious Behavior No self-injurious ideation or behavior indicators observed or expressed   Agreement Not to Harm Self Yes  Description of Agreement verbal  Danger to Others  Danger to Others None reported or observed

## 2022-08-29 NOTE — Group Note (Signed)
Date:  08/29/2022 Time:  4:08 PM  Group Topic/Focus:  Anger Management  Additional Comments:  Pt was provided with anger management materials and encouraged to come to staff with any questions.   Cheryl Adams 08/29/2022, 4:08 PM

## 2022-08-30 DIAGNOSIS — F251 Schizoaffective disorder, depressive type: Secondary | ICD-10-CM | POA: Diagnosis not present

## 2022-08-30 NOTE — Progress Notes (Signed)
[facility] Group Notes:  (Nursing/MHT/Case Management/Adjunct)  Date:  08/30/2022  Time:  2000  Type of Therapy:   wrap up group  Participation Level:  Active  Participation Quality:  Appropriate, Attentive, Sharing, and Supportive  Affect:  Not Congruent  Cognitive:  Alert  Insight:  Lacking  Engagement in Group:  Engaged  Modes of Intervention:  Clarification, Education, and Support  Summary of Progress/Problems: Positive thinking and positive change were discussed.   Winfield Rast S 08/30/2022, 9:07 PM

## 2022-08-30 NOTE — Group Note (Signed)
[facility] LCSW Group Therapy Note   Group Date: 08/30/2022 Start Time: 1300 End Time: 1400   Type of Therapy and Topic: Group Therapy: Avoiding Self-Sabotaging and Enabling Behaviors  Participation Level: Minimal  Description of Group:  In this group, patients will learn how to identify obstacles, self-sabotaging and enabling behaviors, as well as: what are they, why do we do them and what needs these behaviors meet. Discuss unhealthy relationships and how to have positive healthy boundaries with those that sabotage and enable. Explore aspects of self-sabotage and enabling in yourself and how to limit these self-destructive behaviors in everyday life.   Therapeutic Goals: 1. Patient will identify one obstacle that relates to self-sabotage and enabling behaviors 2. Patient will identify one personal self-sabotaging or enabling behavior they did prior to admission 3. Patient will state a plan to change the above identified behavior 4. Patient will demonstrate ability to communicate their needs through discussion and/or role play.    Summary of Patient Progress: Patient was present for the entirety of group session. Patient participated in opening and closing remarks. However, patient did not contribute at all to the topic of discussion despite encouraged participation.    Therapeutic Modalities:  Cognitive Behavioral Therapy Person-Centered Therapy Motivational Interviewing    Iram Astorino W Dwight Adamczak, LCSWA 

## 2022-08-30 NOTE — Progress Notes (Signed)
   08/30/22 2210  Psych Admission Type (Psych Patients Only)  Admission Status Voluntary  Psychosocial Assessment  Patient Complaints None  Eye Contact Brief  Facial Expression Flat  Affect Appropriate to circumstance  Speech Logical/coherent  Interaction Childlike  Motor Activity Slow  Appearance/Hygiene Poor hygiene  Behavior Characteristics Appropriate to situation  Mood Pleasant  Thought Process  Coherency WDL  Content WDL  Delusions None reported or observed  Perception WDL  Hallucination None reported or observed  Judgment Limited  Confusion None  Danger to Self  Current suicidal ideation? Denies  Self-Injurious Behavior No self-injurious ideation or behavior indicators observed or expressed   Agreement Not to Harm Self Yes  Description of Agreement verbal  Danger to Others  Danger to Others None reported or observed

## 2022-08-30 NOTE — Plan of Care (Signed)
  Problem: Education: Goal: Emotional status will improve Outcome: Progressing Goal: Mental status will improve Outcome: Progressing   

## 2022-08-30 NOTE — Progress Notes (Signed)
   08/30/22 0900  Charting Type  Charting Type Shift assessment  Neurological  Neuro (WDL) WDL  HEENT  HEENT (WDL) WDL  Respiratory  Respiratory (WDL) WDL  Cardiac  Cardiac (WDL) WDL  Vascular  Vascular (WDL) WDL  Integumentary  Integumentary (WDL) WDL  Braden Scale (Ages 8 and up)  Sensory Perceptions 4  Moisture 4  Activity 4  Mobility 4  Nutrition 3  Friction and Shear 3  Braden Scale Score 22  Musculoskeletal  Musculoskeletal (WDL) WDL  Gastrointestinal  Gastrointestinal (WDL) WDL  GU Assessment  Genitourinary (WDL) WDL  Neurological  Level of Consciousness Alert    

## 2022-08-30 NOTE — [facility] Group Notes (Signed)
Patient attended group therapy activity called "Now Future Wall". Activity included things that are currently going on in the patient's life, what they want their futures to look like, and what walls or barriers are getting in the way. On the back side, patient listed coping skills to help them when they are faced with the "walls". Patient identified "going to the mall" as one of their future goals.
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
## 2022-08-31 DIAGNOSIS — F251 Schizoaffective disorder, depressive type: Secondary | ICD-10-CM | POA: Diagnosis not present

## 2022-08-31 LAB — CBC WITH DIFFERENTIAL/PLATELET
Abs Immature Granulocytes: 0.06 10*3/uL (ref 0.00–0.07)
Basophils Absolute: 0 10*3/uL (ref 0.0–0.1)
Basophils Relative: 1 %
Eosinophils Absolute: 0.2 10*3/uL (ref 0.0–0.5)
Eosinophils Relative: 4 %
HCT: 39.3 % (ref 36.0–46.0)
Hemoglobin: 12.2 g/dL (ref 12.0–15.0)
Immature Granulocytes: 1 %
Lymphocytes Relative: 30 %
Lymphs Abs: 1.6 10*3/uL (ref 0.7–4.0)
MCH: 26.1 pg (ref 26.0–34.0)
MCHC: 31 g/dL (ref 30.0–36.0)
MCV: 84 fL (ref 80.0–100.0)
Monocytes Absolute: 0.4 10*3/uL (ref 0.1–1.0)
Monocytes Relative: 7 %
Neutro Abs: 3 10*3/uL (ref 1.7–7.7)
Neutrophils Relative %: 57 %
Platelets: 252 10*3/uL (ref 150–400)
RBC: 4.68 MIL/uL (ref 3.87–5.11)
RDW: 14.6 % (ref 11.5–15.5)
WBC: 5.3 10*3/uL (ref 4.0–10.5)
nRBC: 0 % (ref 0.0–0.2)
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
## 2022-08-31 NOTE — Group Note (Unsigned)
Date:  08/31/2022 Time:  9:49 AM  Group Topic/Focus:  Goals Group:   The focus of this group is to help patients establish daily goals to achieve during treatment and discuss how the patient can incorporate goal setting into their daily lives to aide in recovery. Orientation:   The focus of this group is to educate the patient on the purpose and policies of crisis stabilization and provide a format to answer questions about their admission.  The group details unit policies and expectations of patients while admitted.     Participation Level:  {BHH PARTICIPATION XNATF:57322}  Participation Quality:  {BHH PARTICIPATION QUALITY:22265}  Affect:  {BHH AFFECT:22266}  Cognitive:  {BHH COGNITIVE:22267}  Insight: {BHH Insight2:20797}  Engagement in Group:  {BHH ENGAGEMENT IN GURKY:70623}  Modes of Intervention:  {BHH MODES OF INTERVENTION:22269}  Additional Comments:  ***  Garvin Fila 08/31/2022, 9:49 AM

## 2022-08-31 NOTE — Group Note (Unsigned)
Date:  08/31/2022 Time:  10:05 AM  Group Topic/Focus:  Goals Group:   The focus of this group is to help patients establish daily goals to achieve during treatment and discuss how the patient can incorporate goal setting into their daily lives to aide in recovery.     Participation Level:  {BHH PARTICIPATION LEVEL:22264}  Participation Quality:  {BHH PARTICIPATION QUALITY:22265}  Affect:  {BHH AFFECT:22266}  Cognitive:  {BHH COGNITIVE:22267}  Insight: {BHH Insight2:20797}  Engagement in Group:  {BHH ENGAGEMENT IN GROUP:22268}  Modes of Intervention:  {BHH MODES OF INTERVENTION:22269}  Additional Comments:  ***  [name] [name] 08/31/2022, 10:05 AM  

## 2022-08-31 NOTE — Progress Notes (Signed)
   08/31/22 2030  Psych Admission Type (Psych Patients Only)  Admission Status Voluntary  Psychosocial Assessment  Patient Complaints Anxiety  Eye Contact Brief  Facial Expression Flat  Affect Appropriate to circumstance  Speech Logical/coherent  Interaction Minimal  Motor Activity Slow  Appearance/Hygiene Poor hygiene  Behavior Characteristics Cooperative  Mood Preoccupied;Pleasant  Aggressive Behavior  Effect No apparent injury  Thought Process  Coherency WDL  Content WDL  Delusions WDL  Perception WDL  Hallucination None reported or observed  Judgment WDL  Confusion None  Danger to Self  Current suicidal ideation? Denies  Danger to Others  Danger to Others None reported or observed

## 2022-08-31 NOTE — Group Note (Signed)
Recreation Therapy Group Note   Group Topic:Stress Management  Group Date: 08/31/2022 Start Time: 0935 End Time: 0955 Facilitators: Nilton Lave-McCall, LRT,CTRS Location: [address]   Goal Area(s) Addresses:  Patient will identify positive stress management techniques. Patient will identify benefits of using stress management post d/c.   Group Description:  Meditation.  LRT played a meditation for patients from the Calm app that focused on strengthening resilience during tough situations to create peace moving forward.  Patients were to listen and follow along as meditation played to fully engage in the concept being offered.  LRT and patients discussed other areas to engage in stress management such as Youtube, scripts, apps and the Internet.     Affect/Mood: Appropriate   Participation Level: N/A   Participation Quality: Independent   Behavior: Sleep   Speech/Thought Process: None   Insight: None   Judgement: None   Modes of Intervention: App   Patient Response to Interventions:  None   Education Outcome:  Acknowledges education and In group clarification offered    Clinical Observations/Individualized Feedback: Pt was asleep during group session.    Plan: Continue to engage patient in RT group sessions 2-3x/week.   Demichael Traum-McCall, LRT,CTRS 08/31/2022 12:07 PM

## 2022-08-31 NOTE — Progress Notes (Signed)
D: Pt on unit, attending groups and compliant with medication. No drooling noted. Pt denies complaints. A: Medications administered per md order. Support and encouragement offered. Q 15 minute checks ongoing. R: Pt denies si/hi/self harm thoughts as well as a/v hallucinations.

## 2022-08-31 NOTE — Group Note (Signed)
LCSW Group Therapy Note   Group Date: 08/31/2022 Start Time: 1300 End Time: 1400  Type of Therapy and Topic:  Group Therapy:  Stress Management   Participation Level:  Active    Description of Group:  Patients in this group were introduced to the idea of stress and encouraged to discuss negative and positive ways to manage stress. Patients discussed specific stressors that they have in their life right now and the physical signs and symptoms associated with that stress.  Patient encouraged to come up with positive changes to assist with the stress upon discharge in order to prevent future hospitalizations.   They also worked as a group on developing a specific plan for several patients to deal with stressors through [place], psychoeducation and self care techniques   Therapeutic Goals:               1)  To discuss the positive and negative impacts of stress             2)  identify signs and symptoms of stress             3)  generate ideas for stress management             4)  offer mutual support to others regarding stress management             5)  Developing plans for ways to manage specific stressors upon discharge               Summary of Patient Progress: The Pt attended group and remained there the entire time.  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  The Pt accepted all worksheets that were provided, was appropriate with their peers, and was open to the discussion.    Therapeutic Modalities:   Motivational Interviewing Brief Solution-Focused Therapy  Darleen Crocker, [place] 08/31/2022  2:07 PM

## 2022-09-01 DIAGNOSIS — F251 Schizoaffective disorder, depressive type: Secondary | ICD-10-CM | POA: Diagnosis not present

## 2022-09-01 LAB — CLOZAPINE (CLOZARIL)
Clozapine Lvl: 231 ng/mL — ABNORMAL LOW (ref 350–600)
NorClozapine: 64 ng/mL
Total(Cloz+Norcloz): 295 ng/mL

## 2022-09-01 NOTE — Group Note (Unsigned)
Date:  09/01/2022 Time:  11:13 AM  Group Topic/Focus:  Goals Group:   The focus of this group is to help patients establish daily goals to achieve during treatment and discuss how the patient can incorporate goal setting into their daily lives to aide in recovery. Orientation:   The focus of this group is to educate the patient on the purpose and policies of crisis stabilization and provide a format to answer questions about their admission.  The group details unit policies and expectations of patients while admitted.     Participation Level:  {BHH PARTICIPATION HYHOO:87579}  Participation Quality:  {BHH PARTICIPATION QUALITY:22265}  Affect:  {BHH AFFECT:22266}  Cognitive:  {BHH COGNITIVE:22267}  Insight: {BHH Insight2:20797}  Engagement in Group:  {BHH ENGAGEMENT IN JKQAS:60156}  Modes of Intervention:  {BHH MODES OF INTERVENTION:22269}  Additional Comments:  ***  Garvin Fila 09/01/2022, 11:13 AM

## 2022-09-01 NOTE — Progress Notes (Signed)
   09/01/22 0530  Sleep  Number of Hours 6.5    

## 2022-09-01 NOTE — Progress Notes (Signed)
Patient did not attend group.
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
## 2022-09-01 NOTE — Progress Notes (Signed)
   09/01/22 2130  Psych Admission Type (Psych Patients Only)  Admission Status Voluntary  Psychosocial Assessment  Patient Complaints None  Eye Contact Brief  Facial Expression Flat  Affect Appropriate to circumstance  Speech Logical/coherent  Interaction Minimal  Motor Activity Slow  Appearance/Hygiene Poor hygiene  Behavior Characteristics Cooperative  Mood Pleasant  Aggressive Behavior  Effect No apparent injury  Thought Process  Coherency WDL  Content WDL  Delusions WDL  Perception WDL  Hallucination None reported or observed  Judgment WDL  Confusion None  Danger to Self  Current suicidal ideation? Denies  Danger to Others  Danger to Others None reported or observed

## 2022-09-01 NOTE — Progress Notes (Signed)
   09/01/22 0800  Psych Admission Type (Psych Patients Only)  Admission Status Voluntary  Psychosocial Assessment  Patient Complaints None  Eye Contact Brief  Facial Expression Flat  Affect Appropriate to circumstance  Speech Logical/coherent  Interaction Cautious;Minimal  Motor Activity Slow  Appearance/Hygiene Poor hygiene  Behavior Characteristics Cooperative  Mood Pleasant;Preoccupied  Thought Process  Coherency WDL  Content WDL  Delusions None reported or observed  Perception WDL  Hallucination None reported or observed  Judgment WDL  Confusion None  Danger to Self  Current suicidal ideation? Denies  Self-Injurious Behavior No self-injurious ideation or behavior indicators observed or expressed   Description of Agreement Verbal  Danger to Others  Danger to Others None reported or observed

## 2022-09-01 NOTE — [facility] Group Notes (Addendum)
Spiritual care group on grief and loss facilitated by chaplain Janne Napoleon, [facility]   Group Goal:   Support / Education around grief and loss   Members engage in facilitated group support and psycho-social education.   Group Description:   Following introductions and group rules, group members engaged in facilitated group dialog and support around topic of loss, with particular support around experiences of loss in their lives. Group Identified types of loss (relationships / self / things) and identified patterns, circumstances, and changes that precipitate losses. Reflected on thoughts / feelings around loss, normalized grief responses, and recognized variety in grief experience. Group noted Worden's four tasks of grief in discussion.   Group drew on Adlerian / Rogerian, narrative, MI,   Patient Progress: Cheryl Adams attended group and participated and engaged in the conversation.  Her comments showed insight into the topic.  [address], [place] Pager, [phone]

## 2022-09-02 ENCOUNTER — Encounter (HOSPITAL_COMMUNITY): Payer: Self-pay

## 2022-09-02 DIAGNOSIS — F251 Schizoaffective disorder, depressive type: Secondary | ICD-10-CM | POA: Diagnosis not present

## 2022-09-02 NOTE — Progress Notes (Signed)
Pt out of room and on unit throughout the day. Pt quiet, sits to self however does attend groups. Pt denies si/hi/self harm thoughts as well as a/v hallucinations. Q 15 minute checks ongoing for safety.

## 2022-09-02 NOTE — Group Note (Signed)
LCSW Group Therapy Note   Group Date: 09/02/2022 Start Time: 1300 End Time: 1400  Type of Therapy and Topic: Group Therapy: Anger Management   Participation Level:  Active  Description of Group: In this group, patients will learn helpful strategies and techniques to manage anger, express anger in alternative ways, change hostile attitudes, and prevent aggressive acts, such as verbal abuse and violence. This group will be process-oriented and educational, with patients participating in exploration of their own experiences as well as giving and receiving support and challenge from other group members.  Therapeutic Goals: Patient will learn to manage anger. Patient will learn to stop violence or the threat of violence. Patient will learn to develop self control over thoughts and actions. Patient will receive support and feedback from others  Summary of Patient Progress: The Pt came to group and remained there the entire time.  The Pt accepted all worksheets and participated openly in the group discussion.  The Pt was appropriate with peers and discussed various ways they can cope with anger.    Therapeutic Modalities: Cognitive Behavioral Therapy Solution Focused Therapy Motivational Interviewing  Yaniah Thiemann M Neylan Koroma, LCSWA 09/02/2022  2:04 PM    

## 2022-09-02 NOTE — Progress Notes (Signed)
Patient did attend the evening speaker NA meeting.  

## 2022-09-02 NOTE — Progress Notes (Signed)
   09/02/22 2100  Psych Admission Type (Psych Patients Only)  Admission Status Voluntary  Psychosocial Assessment  Patient Complaints None  Eye Contact Brief  Facial Expression Flat  Affect Appropriate to circumstance  Speech Logical/coherent  Interaction Minimal  Motor Activity Slow  Appearance/Hygiene Poor hygiene  Behavior Characteristics Cooperative  Mood Pleasant  Aggressive Behavior  Effect No apparent injury  Thought Process  Coherency WDL  Content WDL  Delusions WDL  Perception WDL  Hallucination None reported or observed  Judgment WDL  Confusion None  Danger to Self  Current suicidal ideation? Denies  Danger to Others  Danger to Others None reported or observed

## 2022-09-02 NOTE — [facility] Group Notes (Signed)
Adult Goals Group Note  Date:  09/02/2022 Time:  2:04 PM  Group Topic/Focus:  Goals Group:   The focus of this group is to help patients establish daily goals to achieve during treatment and discuss how the patient can incorporate goal setting into their daily lives to aide in recovery.  Participation Level:  Active  Participation Quality:  Appropriate  Affect:  Appropriate  Cognitive:  Appropriate  Insight: Appropriate  Engagement in Group:  Engaged  Modes of Intervention:  Education  Kern Reap 09/02/2022, 2:04 PM
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
## 2022-09-02 NOTE — Progress Notes (Signed)
   09/02/22 0530  Sleep  Number of Hours 7.25    

## 2022-09-02 NOTE — BH IP Treatment Plan (Signed)
Interdisciplinary Treatment and Diagnostic Plan Update  09/02/2022 Time of Session: 0830 Cheryl Adams MRN: [id number]  Principal Diagnosis: Schizoaffective disorder, depressive type [facility])  Secondary Diagnoses: Principal Problem:   Schizoaffective disorder, depressive type (HCC) Active Problems:   GAD (generalized anxiety disorder)   Vitamin D deficiency   Sialorrhea   Current Medications:  Current Facility-Administered Medications  Medication Dose Route Frequency Provider Last Rate Last Admin   acetaminophen (TYLENOL) tablet 500 mg  500 mg Oral Q6H PRN Massengill, Harrold Donath, MD       amLODipine (NORVASC) tablet 10 mg  10 mg Oral Daily Massengill, Nathan, MD   10 mg at 09/02/22 0751   antiseptic oral rinse (BIOTENE) solution 15 mL  15 mL Mouth Rinse 5 X Daily PRN Comer Locket, MD       atropine 1 % ophthalmic solution 1 drop  1 drop Sublingual TID Phineas Inches, MD   1 drop at 09/02/22 0753   cloZAPine (CLOZARIL) tablet 200 mg  200 mg Oral QHS Nkwenti, Doris, NP   200 mg at 09/01/22 2107   docusate sodium (COLACE) capsule 100 mg  100 mg Oral BID Massengill, Harrold Donath, MD   100 mg at 09/02/22 0753   ferrous sulfate tablet 325 mg  325 mg Oral Daily Princess Bruins, DO   325 mg at 09/02/22 5462   hydrOXYzine (ATARAX) tablet 25 mg  25 mg Oral TID PRN Phineas Inches, MD   25 mg at 06/17/22 2027   OLANZapine zydis (ZYPREXA) disintegrating tablet 5 mg  5 mg Oral Q8H PRN Massengill, Harrold Donath, MD   5 mg at 05/24/22 2154   And   LORazepam (ATIVAN) tablet 1 mg  1 mg Oral Q8H PRN Massengill, Harrold Donath, MD   1 mg at 07/03/22 2150   And   ziprasidone (GEODON) injection 20 mg  20 mg Intramuscular Q8H PRN Massengill, Harrold Donath, MD       metFORMIN (GLUCOPHAGE-XR) 24 hr tablet 500 mg  500 mg Oral Q breakfast Massengill, Nathan, MD   500 mg at 09/02/22 0751   methylphenidate (RITALIN) tablet 10 mg  10 mg Oral Daily Hill, Shelbie Hutching, MD   10 mg at 09/02/22 0751   polyethylene glycol (MIRALAX /  GLYCOLAX) packet 17 g  17 g Oral Daily Massengill, Harrold Donath, MD   17 g at 09/02/22 0751   sertraline (ZOLOFT) tablet 100 mg  100 mg Oral Daily Massengill, Harrold Donath, MD   100 mg at 09/02/22 7035   vitamin D3 (CHOLECALCIFEROL) tablet 1,000 Units  1,000 Units Oral Daily Massengill, Harrold Donath, MD   1,000 Units at 09/02/22 0751   PTA Medications: Medications Prior to Admission  Medication Sig Dispense Refill Last Dose   ferrous sulfate 325 (65 FE) MG tablet Take 325 mg by mouth daily.      risperiDONE (RISPERDAL) 1 MG tablet Take 1 tablet (1 mg total) by mouth daily.      risperiDONE (RISPERDAL) 2 MG tablet Take 1 tablet (2 mg total) by mouth at bedtime.       Patient Stressors: Health problems   Marital or family conflict   Medication change or noncompliance    Patient Strengths: Average or above average intelligence  Supportive family/friends   Treatment Modalities: Medication Management, Group therapy, Case management,  1 to 1 session with clinician, Psychoeducation, Recreational therapy.   Physician Treatment Plan for Primary Diagnosis: Schizoaffective disorder, depressive type (HCC) Long Term Goal(s): Improvement in symptoms so as ready for discharge   Short Term Goals: Ability  to verbalize feelings will improve Ability to disclose and discuss suicidal ideas Ability to identify and develop effective coping behaviors will improve Compliance with prescribed medications will improve  Medication Management: Evaluate patient's response, side effects, and tolerance of medication regimen.  Therapeutic Interventions: 1 to 1 sessions, Unit Group sessions and Medication administration.  Evaluation of Outcomes: Progressing  Physician Treatment Plan for Secondary Diagnosis: Principal Problem:   Schizoaffective disorder, depressive type ([place]) Active Problems:   GAD (generalized anxiety disorder)   Vitamin D deficiency   Sialorrhea  Long Term Goal(s): Improvement in symptoms so as ready for  discharge   Short Term Goals: Ability to verbalize feelings will improve Ability to disclose and discuss suicidal ideas Ability to identify and develop effective coping behaviors will improve Compliance with prescribed medications will improve     Medication Management: Evaluate patient's response, side effects, and tolerance of medication regimen.  Therapeutic Interventions: 1 to 1 sessions, Unit Group sessions and Medication administration.  Evaluation of Outcomes: Progressing   RN Treatment Plan for Primary Diagnosis: Schizoaffective disorder, depressive type ([place]) Long Term Goal(s): Knowledge of disease and therapeutic regimen to maintain health will improve  Short Term Goals: Ability to remain free from injury will improve, Ability to verbalize frustration and anger appropriately will improve, Ability to demonstrate self-control, Ability to participate in decision making will improve, Ability to verbalize feelings will improve, Ability to disclose and discuss suicidal ideas, Ability to identify and develop effective coping behaviors will improve, and Compliance with prescribed medications will improve  Medication Management: RN will administer medications as ordered by provider, will assess and evaluate patient's response and provide education to patient for prescribed medication. RN will report any adverse and/or side effects to prescribing provider.  Therapeutic Interventions: 1 on 1 counseling sessions, Psychoeducation, Medication administration, Evaluate responses to treatment, Monitor vital signs and CBGs as ordered, Perform/monitor CIWA, COWS, AIMS and Fall Risk screenings as ordered, Perform wound care treatments as ordered.  Evaluation of Outcomes: Progressing   LCSW Treatment Plan for Primary Diagnosis: Schizoaffective disorder, depressive type ([place]) Long Term Goal(s): Safe transition to appropriate next level of care at discharge, Engage patient in therapeutic group  addressing interpersonal concerns.  Short Term Goals: Engage patient in aftercare planning with referrals and resources, Increase social support, Increase ability to appropriately verbalize feelings, Increase emotional regulation, Facilitate acceptance of mental health diagnosis and concerns, Facilitate patient progression through stages of change regarding substance use diagnoses and concerns, Identify triggers associated with mental health/substance abuse issues, and Increase skills for wellness and recovery  Therapeutic Interventions: Assess for all discharge needs, 1 to 1 time with Social worker, Explore available resources and support systems, Assess for adequacy in community support network, Educate family and significant other(s) on suicide prevention, Complete Psychosocial Assessment, Interpersonal group therapy.  Evaluation of Outcomes: Progressing   Progress in Treatment: Attending groups: Yes. Participating in groups: Yes. Taking medication as prescribed: Yes. Toleration medication: Yes. Family/Significant other contact made: Yes, individual(s) contacted:   Cheryl Adams [phone] (Mother) Patient understands diagnosis: Yes. Discussing patient identified problems/goals with staff: Yes. Medical problems stabilized or resolved: Yes. Denies suicidal/homicidal ideation: Yes. Issues/concerns per patient self-inventory: Yes. Other: none  New problem(s) identified: No, Describe:  none  New Short Term/Long Term Goal(s): Patient to work towards elimination of symptoms of psychosis, medication management for mood stabilization; elimination of SI thoughts; development of comprehensive mental wellness plan.  Patient Goals: No additional goals identified at this time. Patient to continue to work towards  original goals identified in initial treatment team meeting. CSW will remain available to patient should they voice additional treatment goals.   Discharge Plan or Barriers: No  psychosocial barriers identified at this time, patient to return to place of residence when appropriate for discharge.   Reason for Continuation of Hospitalization: Other; describe boarding  Estimated Length of Stay: TBD, patient is boarding   Scribe for Treatment Team: Almedia Balls 09/02/2022 12:05 PM

## 2022-09-02 NOTE — Group Note (Signed)
Recreation Therapy Group Note   Group Topic:Team Building  Group Date: 09/02/2022 Start Time: 0930 End Time: 0959 Facilitators: Milburn Freeney-McCall, LRT,CTRS Location: [address]   Goal Area(s) Addresses:  Patient will effectively work with peer towards shared goal.  Patient will identify skill used to make activity successful.  Patient will identify how skills used during activity can be used to reach post d/c goals.    Group Description: Patient(s) were given a set of solo cups, a rubber band, and some tied strings. The objective is to build a pyramid with the cups by only using the rubber band and string to move the cups. After the activity the patient(s) are LRT debriefed and discussed what strategies worked, what didn't, and what lessons they can take from the activity and use in life post discharge.    Affect/Mood: Appropriate   Participation Level: Engaged   Participation Quality: Independent   Behavior: Appropriate   Speech/Thought Process: Focused   Insight: Good   Judgement: Good   Modes of Intervention: Cooperative Play   Patient Response to Interventions:  Engaged   Education Outcome:  Acknowledges education and In group clarification offered    Clinical Observations/Individualized Feedback: Pt was engaged and worked well with peers in completing the activity.  Pt was quiet but made suggestions when needed.  Pt was appropriate during group session.    Plan: Continue to engage patient in RT group sessions 2-3x/week.   Javaria Knapke-McCall, LRT,CTRS 09/02/2022 12:12 PM

## 2022-09-03 DIAGNOSIS — F251 Schizoaffective disorder, depressive type: Secondary | ICD-10-CM | POA: Diagnosis not present

## 2022-09-03 MED ORDER — METHYLPHENIDATE HCL ER (LA) 10 MG PO CP24
10.0000 mg | ORAL_CAPSULE | Freq: Every day | ORAL | Status: DC
  Administered 2022-09-04 – 2022-09-07 (×4): 10 mg via ORAL
  Filled 2022-09-03 (×4): qty 1

## 2022-09-03 MED ORDER — ATENOLOL 12.5 MG HALF TABLET
12.5000 mg | ORAL_TABLET | Freq: Every day | ORAL | Status: DC
  Administered 2022-09-03 – 2022-09-10 (×8): 12.5 mg via ORAL
  Filled 2022-09-03 (×9): qty 1

## 2022-09-03 NOTE — Group Note (Signed)
Date:  09/03/2022 Time:  11:34 AM  Group Topic/Focus:  Orientation:   The focus of this group is to educate the patient on the purpose and policies of crisis stabilization and provide a format to answer questions about their admission.  The group details unit policies and expectations of patients while admitted.    Participation Level:  Minimal  Participation Quality:  Resistant  Affect:  Blunted  Cognitive:  Lacking  Insight: Limited  Engagement in Group:  Lacking  Modes of Intervention:  Discussion  Additional Comments:     Jerrye Beavers 09/03/2022, 11:34 AM

## 2022-09-03 NOTE — Progress Notes (Signed)
Adult Psychoeducational Group Note  Date:  09/03/2022 Time:  8:41 PM  Group Topic/Focus:  Wrap-Up Group:   The focus of this group is to help patients review their daily goal of treatment and discuss progress on daily workbooks.  Participation Level:  Active  Participation Quality:  Appropriate  Affect:  Appropriate  Cognitive:  Appropriate  Insight: Appropriate  Engagement in Group:  Engaged  Modes of Intervention:  Education and Exploration  Additional Comments:  Patient attended and participated in  group  tonight. She reports that since she has been here she have learnt different ways to cope.  [facility] 09/03/2022, 8:41 PM
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
## 2022-09-03 NOTE — Plan of Care (Signed)
Patient alert, oriented and able to make needs known. Pt has been sitting in the hallway today with no acute distress. Denies any physical complaints and denies any pain. She reports she slept fair last night, she has a fair amount of energy and concentration today. She rates her depression a 4/10, hopelessness 4/10 and anxiety 4/10. Denies SI/HI/AVH. Will continue to provide verbal encouragement and redirection as needed.    Problem: Education: Goal: Knowledge of [facility] Education information/materials will improve Outcome: Progressing Goal: Emotional status will improve Outcome: Progressing Goal: Mental status will improve Outcome: Progressing Goal: Verbalization of understanding the information provided will improve Outcome: Progressing   Problem: Health Behavior/Discharge Planning: Goal: Identification of resources available to assist in meeting health care needs will improve Outcome: Progressing Goal: Compliance with treatment plan for underlying cause of condition will improve Outcome: Progressing

## 2022-09-03 NOTE — Group Note (Signed)
Date:  09/03/2022 Time:  2:54 PM  Group Topic/Focus:  Dimensions of Wellness:   The focus of this group is to introduce the topic of wellness and discuss the role each dimension of wellness plays in total health.    Participation Level:  None  Participation Quality:  Resistant  Affect:  Blunted  Cognitive:  Lacking  Insight: Limited  Engagement in Group:  Poor  Modes of Intervention:  Discussion  Additional Comments:     Jerrye Beavers 09/03/2022, 2:54 PM

## 2022-09-04 DIAGNOSIS — F251 Schizoaffective disorder, depressive type: Secondary | ICD-10-CM | POA: Diagnosis not present

## 2022-09-04 NOTE — Plan of Care (Signed)
Patient alert, verbal and able to make needs known. Pt reports fair sleep last night. Denies any physical pain today. Reports her appetite has been fair the last 24 hours. Rates her depression 4/10, hopelessness 4/10 and anxiety 4/10. Denies SI/HI/AVH. Contracts for safety. Takes medication as prescribed.     Problem: Education: Goal: Knowledge of [facility] Education information/materials will improve Outcome: Progressing Goal: Emotional status will improve Outcome: Progressing Goal: Mental status will improve Outcome: Progressing Goal: Verbalization of understanding the information provided will improve Outcome: Progressing   Problem: Activity: Goal: Interest or engagement in activities will improve Outcome: Progressing Goal: Sleeping patterns will improve Outcome: Progressing
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
## 2022-09-04 NOTE — B[facility] Group Notes (Signed)
Adult Goals Group Note  Date:  09/04/2022 Time:  9:52 AM  Group Topic/Focus:  Goals Group:   The focus of this group is to help patients establish daily goals to achieve during treatment and discuss how the patient can incorporate goal setting into their daily lives to aide in recovery.  Participation Level:  Active  Participation Quality:  Appropriate  Affect:  Appropriate  Cognitive:  Appropriate  Insight: Appropriate  Engagement in Group:  Engaged  Modes of Intervention:  Education  Katherina Right 09/04/2022, 9:52 AM

## 2022-09-04 NOTE — Group Note (Signed)
Recreation Therapy Group Note   Group Topic:Team Building  Group Date: 09/04/2022 Start Time: 0930 End Time: 1000 Facilitators: Filip Luten-McCall, LRT,CTRS Location: [address] Dayroom   Goal Area(s) Addresses:  Patient will effectively work with peer towards shared goal.  Patient will identify skills used to make activity successful.  Patient will identify how skills used during activity can be used to reach post d/c goals.   Group Description: Landing Pad. In teams of 3-5, patients were given 12 plastic drinking straws and an equal length of masking tape. Using the materials provided, patients were asked to build a landing pad to catch a golf ball dropped from approximately 5 feet in the air. All materials were required to be used by the team in their design. LRT facilitated post-activity discussion.   Affect/Mood: Appropriate   Participation Level: Engaged   Participation Quality: Independent   Behavior: Appropriate   Speech/Thought Process: Focused   Insight: Good   Judgement: Good   Modes of Intervention: STEM Activity   Patient Response to Interventions:  Engaged   Education Outcome:  Acknowledges education and In group clarification offered    Clinical Observations/Individualized Feedback: Pt was engaged and worked well with peers in completing activity.    Plan: Continue to engage patient in RT group sessions 2-3x/week.   Isiaih Hollenbach-McCall, LRT,CTRS 09/04/2022 12:15 PM

## 2022-09-04 NOTE — Progress Notes (Signed)
   09/04/22 2000  Psych Admission Type (Psych Patients Only)  Admission Status Voluntary  Psychosocial Assessment  Patient Complaints None  Eye Contact Brief  Facial Expression Sad  Affect Appropriate to circumstance  Speech Logical/coherent  Interaction Childlike;Minimal  Motor Activity Slow  Appearance/Hygiene Poor hygiene  Behavior Characteristics Cooperative  Mood Preoccupied  Aggressive Behavior  Effect No apparent injury  Thought Process  Coherency WDL  Content WDL  Delusions WDL  Perception WDL  Hallucination None reported or observed  Judgment WDL  Confusion WDL  Danger to Self  Current suicidal ideation? Denies  Danger to Others  Danger to Others None reported or observed

## 2022-09-04 NOTE — Group Note (Signed)
LCSW Group Therapy Note   Group Date: 09/04/2022 Start Time: 1300 End Time: 1400  Type of Therapy: Group Therapy: Boundaries  Participation Level:  Minimal  Description of Group: This group will address the use of boundaries in their personal lives. Patients will explore why boundaries are important, the difference between healthy and unhealthy boundaries, and negative and positive outcomes of different boundaries and will look at how boundaries can be crossed.  Patients will be encouraged to identify current boundaries in their own lives and identify what kind of boundary is being set. Facilitators will guide patients in utilizing problem-solving interventions to address and correct types boundaries being used and to address when no boundary is being used. Understanding and applying boundaries will be explored and addressed for obtaining and maintaining a balanced life. Patients will be encouraged to explore ways to assertively make their boundaries and needs known to significant others in their lives, using other group members and facilitator for role play, support, and feedback.   Therapeutic Goals: 1. Patient will identify areas in their life where setting clear boundaries could be used to improve their life.  2. Patient will identify signs/triggers that a boundary is not being respected. 3. Patient will identify two ways to set boundaries in order to achieve balance in their lives: 4. Patient will demonstrate ability to communicate their needs and set boundaries through discussion and/or role plays  Summary of Progress/Problems: The Pt attended group and remained there the entire time but the Pt slept during most of the group session.  Darleen Crocker, LCSWA 09/04/2022  2:02 PM

## 2022-09-04 NOTE — Progress Notes (Signed)
   09/04/22 0400  Psych Admission Type (Psych Patients Only)  Admission Status Voluntary  Psychosocial Assessment  Patient Complaints None  Eye Contact Brief  Facial Expression Flat  Affect Appropriate to circumstance  Speech Logical/coherent  Interaction Minimal  Motor Activity Slow  Appearance/Hygiene Poor hygiene  Behavior Characteristics Cooperative  Mood Pleasant  Aggressive Behavior  Effect No apparent injury  Thought Process  Coherency WDL  Content WDL  Delusions None reported or observed  Perception WDL  Hallucination None reported or observed  Judgment WDL  Confusion None  Danger to Self  Current suicidal ideation? Denies  Danger to Others  Danger to Others None reported or observed

## 2022-09-04 NOTE — Progress Notes (Signed)
   09/04/22 0631  Sleep  Number of Hours 4.5

## 2022-09-05 DIAGNOSIS — F251 Schizoaffective disorder, depressive type: Secondary | ICD-10-CM | POA: Diagnosis not present

## 2022-09-05 NOTE — Group Note (Signed)
LCSW Group Therapy Note  09/05/2022    10:00-11:00am   Type of Therapy and Topic:  Group Therapy: Early Messages Received About Anger  Participation Level:  None   Description of Group:   In this group, patients shared and discussed the early messages received in their lives about anger through parental or other adult modeling, teaching, repression, punishment, violence, and more.  Participants identified how those childhood lessons influence even now how they usually or often react when angered.  The group discussed that anger is a secondary emotion and what may be the underlying emotional themes that come out through anger outbursts or that are ignored through anger suppression.    Therapeutic Goals: Patients will identify one or more childhood message about anger that they received and how it was taught to them. Patients will discuss how these childhood experiences have influenced and continue to influence their own expression or repression of anger even today. Patients will explore possible primary emotions that tend to fuel their secondary emotion of anger. Patients will learn that anger itself is normal and cannot be eliminated, and that healthier coping skills can assist with resolving conflict rather than worsening situations.  Summary of Patient Progress:  The patient attended but did not speak or participate.  Therapeutic Modalities:   Cognitive Behavioral Therapy Motivation Interviewing  [place], [place] 09/05/2022 10:52 AM
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
## 2022-09-05 NOTE — B[facility] Group Notes (Signed)
.  Psychoeducational Group Note    Date:  09/05/2022 Time: 1300-1400    Purpose of Group: . The group focus' on teaching patients on how to identify their needs and their Life Skills:  A group where two lists are made. What people need and what are things that we do that are unhealthy. The lists are developed by the patients and it is explained that we often do the actions that are not healthy to get our list of needs met.  Goal:: to develop the coping skills needed to get their needs met  Participation Level:  Active  Participation Quality:  limited  Affect:  Appropriate  Cognitive:  Oriented  Insight: Improving  Engagement in Group:  Engaged  Modes of Intervention:  Activity, Discussion, Education, and Support  Additional Comments:  Rates her energy at a 4. Was turned towards the group and did participate.  Paulino Rily

## 2022-09-05 NOTE — Progress Notes (Signed)
Adult Psychoeducational Group Note  Date:  09/05/2022 Time:  8:48 PM  Group Topic/Focus:  Wrap-Up Group:   The focus of this group is to help patients review their daily goal of treatment and discuss progress on daily workbooks.  Participation Level:  Active  Participation Quality:  Appropriate  Affect:  Appropriate  Cognitive:  Appropriate  Insight: Appropriate  Engagement in Group:  Engaged  Modes of Intervention:  Education and Exploration  Additional Comments:  Patient attended and participated in group tonight. She reports that today she learn exercise to help her with anxiety.  Salley Scarlet [facility] 09/05/2022, 8:48 PM

## 2022-09-05 NOTE — Progress Notes (Signed)
D. Pt has been visible in the milieu, attending groups, and  pleasant during interactions. Per pt's self inventory,  pt rated her depression,hopelessness and anxiety all 4's. Pt currently denies SI/HI and AVH  A. Labs and vitals monitored. Pt compliant with meds. Pt supported emotionally and encouraged to express concerns and ask questions.   R. Pt remains safe with 15 minute checks. Will continue POC.

## 2022-09-05 NOTE — Progress Notes (Signed)
   09/05/22 2212  Psych Admission Type (Psych Patients Only)  Admission Status Voluntary  Psychosocial Assessment  Patient Complaints Anxiety (4/10)  Eye Contact Brief  Facial Expression Flat  Affect Appropriate to circumstance  Speech Logical/coherent  Interaction Childlike  Motor Activity Other (Comment) (WNL)  Appearance/Hygiene Poor hygiene  Behavior Characteristics Cooperative  Mood Anxious;Pleasant  Thought Process  Coherency WDL  Content WDL  Delusions None reported or observed  Perception WDL  Judgment Impaired  Confusion None  Danger to Self  Current suicidal ideation? Denies  Danger to Others  Danger to Others None reported or observed   Progress note   D: Pt seen in day[facility]. Pt denies SI, HI, AVH. Pt rates pain  0/10. Pt rates anxiety  4/10 and depression  0/10. Pt states that sometimes she gets anxious for no reason. Pt says she practices deep breathing when she gets anxious. Pt encouraged to continue this practice. Pt reports good sleep, appetite. Pt has been attending groups and seen in milieu. Pt did received clothing items tonight but refused to take them. Said she didn't want them and they could be thrown away. Belongings placed in pt locker in search room. Pt is managing drooling side effect from Clozaril with atropine drops. No other complaints noted at this time.  A: Pt provided support and encouragement. Pt given scheduled medication as prescribed. PRNs as appropriate. Q15 min checks for safety.   R: Pt safe on the unit. Will continue to monitor.

## 2022-09-05 NOTE — Progress Notes (Signed)
   09/05/22 0500  Sleep  Number of Hours 7

## 2022-09-05 NOTE — [facility] Group Notes (Signed)
Psychoeducational Group Note  Date: 09/05/22 Time: 0900-1000    Goal Setting   Purpose of Group: This group helps to provide patients with the steps of setting a goal that is specific, measurable, attainable, realistic and time specific. A discussion on how we keep ourselves stuck with negative self talk. Homework given for Patients to write 30 positive attributes about themselves.    Participation Level:  Active  Participation Quality:  Appropriate  Affect:  Appropriate  Cognitive:  Appropriate  Insight:  Improving  Engagement in Group:  Engaged  Additional Comments:  Rates her energy at a 4/10. This energy number has been the same since she attended her first group. This time patient sat with her back to eme and the rest of the group.   Paulino Rily

## 2022-09-06 DIAGNOSIS — F251 Schizoaffective disorder, depressive type: Secondary | ICD-10-CM | POA: Diagnosis not present

## 2022-09-06 NOTE — Progress Notes (Signed)
Adult Psychoeducational Group Note  Date:  09/06/2022 Time:  8:57 PM  Group Topic/Focus:  Wrap-Up Group:   The focus of this group is to help patients review their daily goal of treatment and discuss progress on daily workbooks.  Participation Level:  Active  Participation Quality:  Appropriate  Affect:  Appropriate  Cognitive:  Appropriate  Insight: Appropriate  Engagement in Group:  Engaged  Modes of Intervention:  Education and Exploration  Additional Comments:  Patient attended and participated in group tonight. He reports that he likes that he it persistant.  Salley Scarlet [facility] 09/06/2022, 8:57 PM

## 2022-09-06 NOTE — Progress Notes (Signed)
D. Pt has been calm and cooperative, and has been visible in the milieu  minimal participation with peers. Pt observed attending all groups today. Pt currently denies SI/HI and AVH and does not appear to be responding to internal stimuli.  A. Labs and vitals monitored. Pt compliant with meds Pt supported emotionally and encouraged to express concerns and ask questions.   R. Pt remains safe with 15 minute checks. Will continue POC.

## 2022-09-06 NOTE — [facility] Group Notes (Signed)
Psychoeducational Group Patients were given interactive activity in which they were asked to identify 3 signs they were not compensating well in regards to their mental health. Patients were then asked to recognize three things that fuel them, and help them cope with their mental illness. 

## 2022-09-06 NOTE — Plan of Care (Signed)
  Problem: Education: Goal: Emotional status will improve Outcome: Progressing Goal: Mental status will improve Outcome: Progressing   

## 2022-09-06 NOTE — Group Note (Signed)
LCSW Group Therapy Note   Group Date: 04/15/2022 Start Time: 1000 End Time: 1100   Type of Therapy and Topic:  Group Therapy: Wise Mind  Participation Level:  Minimal  Description of Group: Group members were presented with topic about wise mind, reasonable mind, and emotional mind.  Group members asked to identify situations were they used their wise mind, reasonable mind and emotional mind.  Members asked to identify how behaviors affected them after using parts of their mind and identified alternative behaviors they can use when they are in crisis.    Therapeutic Goals:  1. Patients will identify consequences of using emotional mind and rational mind.  2. Patients will engage in discussion on how they cope when they are in crisis 3.  Patients will discuss coping mechanisms to engage their wise mind     Summary of Patient Progress:    Patient participated appropriately in group.  She had fair insight into the group topic.  Patient states that she was going to cope with her emotional mind by incorporating more coping mechanisms such as walking and taking deep breaths.   Therapeutic Modalities:  DBT Solution focused therapy    Chadwin Fury E Leondre Taul, LCSW

## 2022-09-06 NOTE — [facility] Group Notes (Signed)
Adult Psychoeducational Group  Date:  09/07/2022 Time:  1300-1400  Group Topic/Focus: Continuation of the group from Saturday. Looking at the lists that were created and talking about what needs to be done with the homework of 30 positives about themselves.                                     Talking about taking their power back and helping themselves to develop a positive self esteem.      Participation Quality:  remains quiet   Affect:  Appropriate  Cognitive:  Oriented  Insight: lacking  Engagement in Group:  Engaged  Modes of Intervention:  Activity, Discussion, Education, and Support  Additional Comments:  Rates her energy at a 4/10.   Paulino Rily

## 2022-09-06 NOTE — [facility] Group Notes (Signed)
Adult Psychoeducational Group Note Date:  09/06/2022 Time:  1100-1130 Group Topic/Focus: PROGRESSIVE RELAXATION. A group where deep breathing is taught and tensing and relaxation muscle groups is used. Imagery is used as well.  Pts are asked to imagine 3 pillars that hold them up when they are not able to hold themselves up and to share that with the group.   Participation Level:  Active  Participation Quality:  Appropriate  Affect:  Appropriate  Cognitive:  Approprate  Insight: Improving  Engagement in Group:  Engaged  Modes of Intervention:  deep breathing, Imagery. Discussion  Additional Comments:  Pt attending the group. Rates her energy at a 4/10. States what holds her up is music, exercise and positive affirmations   Participation Level: Paulino Rily

## 2022-09-06 NOTE — Progress Notes (Signed)
   09/06/22 0531  Sleep  Number of Hours 6.75
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
## 2022-09-07 ENCOUNTER — Encounter (HOSPITAL_COMMUNITY): Payer: Self-pay

## 2022-09-07 DIAGNOSIS — F251 Schizoaffective disorder, depressive type: Secondary | ICD-10-CM | POA: Diagnosis not present

## 2022-09-07 MED ORDER — METHYLPHENIDATE HCL ER (LA) 10 MG PO CP24
20.0000 mg | ORAL_CAPSULE | Freq: Every day | ORAL | Status: DC
  Administered 2022-09-08: 20 mg via ORAL
  Filled 2022-09-07: qty 2

## 2022-09-07 NOTE — BHH Group Notes (Signed)
  Spiritual care group on grief and loss facilitated by chaplain Janne Napoleon, [facility]   Group Goal:   Support / Education around grief and loss   Members engage in facilitated group support and psycho-social education.   Group Description:   Following introductions and group rules, group members engaged in facilitated group dialog and support around topic of loss, with particular support around experiences of loss in their lives. Group Identified types of loss (relationships / self / things) and identified patterns, circumstances, and changes that precipitate losses. Reflected on thoughts / feelings around loss, normalized grief responses, and recognized variety in grief experience. Group noted Worden's four tasks of grief in discussion.   Group drew on Adlerian / Rogerian, narrative, MI,   Patient Progress: Cheryl Adams attended group, but did not participate. Her eyes were closed  for the duration of the group time.   [address] ,[place] Pager, [phone]

## 2022-09-07 NOTE — Plan of Care (Signed)
  Problem: Education: Goal: Emotional status will improve Outcome: Progressing Goal: Mental status will improve Outcome: Progressing   

## 2022-09-07 NOTE — Group Note (Signed)
LCSW Group Therapy Note   Group Date: 09/07/2022 Start Time: 1300 End Time: 1400  Type of Therapy and Topic: Group Therapy: Anger Management   Participation Level:  Active  Description of Group: In this group, patients will learn helpful strategies and techniques to manage anger, express anger in alternative ways, change hostile attitudes, and prevent aggressive acts, such as verbal abuse and violence. This group will be process-oriented and educational, with patients participating in exploration of their own experiences as well as giving and receiving support and challenge from other group members.  Therapeutic Goals: Patient will learn to manage anger. Patient will learn to stop violence or the threat of violence. Patient will learn to develop self-control over thoughts and actions. Patient will receive support and feedback from others  Summary of Patient Progress:  The Pt attended group and remained there the entire time.  The Pt participated and shared experiences where they became angry about something and was able to change their thoughts and mood prior to it escalating.  The Pt was able to determine coping skills that would be beneficial in the future and shared their thoughts and ideas openly with their peers.    Therapeutic Modalities: Cognitive Behavioral Therapy Solution Focused Therapy Motivational Interviewing  Carneshia Raker M Holbert Caples, LCSWA 09/07/2022  1:52 PM    

## 2022-09-07 NOTE — Progress Notes (Signed)
Adult Psychoeducational Group Note  Date:  09/07/2022 Time:  9:15 PM  Group Topic/Focus:  AA Group  Participation Level:  Active  Participation Quality:  Appropriate and Attentive  Affect:  Appropriate  Cognitive:  Alert and Appropriate  Insight: Appropriate  Engagement in Group:  Engaged  Modes of Intervention:  Discussion and Support  Additional Comments:   Pt attended and actively engaged in the AA Group.  Cheryl Adams 09/07/2022, 9:15 PM

## 2022-09-07 NOTE — Progress Notes (Signed)
Patient resting quietly in bed with eyes closed, Respirations equal and unlabored, skin warm and dry, NAD. Routine safety checks conducted according to facility protocol. Will continue to monitor for safety. 

## 2022-09-07 NOTE — Progress Notes (Signed)
   09/07/22 1945  Psych Admission Type (Psych Patients Only)  Admission Status Voluntary  Psychosocial Assessment  Patient Complaints Anxiety  Eye Contact Avertive  Facial Expression Flat  Affect Appropriate to circumstance  Speech Logical/coherent  Interaction Childlike  Motor Activity Other (Comment) (wnl)  Appearance/Hygiene Poor hygiene  Behavior Characteristics Cooperative;Appropriate to situation  Mood Pleasant  Thought Process  Coherency WDL  Content WDL  Delusions None reported or observed  Perception WDL  Hallucination None reported or observed  Judgment Impaired  Confusion None  Danger to Self  Current suicidal ideation? Denies  Danger to Others  Danger to Others None reported or observed   Progress note   D: Pt seen in alcove. Pt denies SI, HI, AVH. Pt rates pain  0/10. Pt rates anxiety  4/10 and depression  0/10. Pt states that she used her breathing exercises today when she was anxious. Pt reports group attendance, good appetite and sleep. Pt denies any other complaints at this time.  A: Pt provided support and encouragement. Pt given scheduled medication as prescribed. PRNs as appropriate. Q15 min checks for safety.   R: Pt safe on the unit. Will continue to monitor.

## 2022-09-07 NOTE — Progress Notes (Signed)
   09/07/22 0628  Sleep  Number of Hours 7.5

## 2022-09-07 NOTE — Progress Notes (Signed)
Recreation Therapy Notes  Date: 10.9.23 Time: 0930 Location: [address] Dayroom  Group Topic: Stress Management   Goal Area(s) Addresses:  Patient will actively participate in stress management techniques presented during session.  Patient will successfully identify benefit of practicing stress management post d/c.   Intervention: Relaxation exercise with ambient sound and script   Activity: Guided Imagery. LRT provided education, instruction, and demonstration on practice of visualization via guided imagery. Patient was asked to participate in the technique introduced during session. LRT debriefed including topics of mindfulness, stress management and specific scenarios each patient could use these techniques. Patients were given suggestions of ways to access scripts post d/c and encouraged to explore Youtube and other apps available on smartphones, tablets, and computers.  Education:  Stress Management, Discharge Planning.   Education Outcome: Acknowledges education  Clinical Observations/Feedback: Patient did not attend group.    Cheryl Adams, LRT,CTRS        Cheryl Adams 09/07/2022 12:31 PM

## 2022-09-07 NOTE — Progress Notes (Signed)
Pt denied SI/HI/AVH this morning. Pt rated her depression and anxiety a 4/10. Pt has been calm and cooperative throughout the day. Pt given scheduled medications as prescribed. Q15 min checks verified for safety. Pt is safe on the unit.    09/07/22 1004  Psych Admission Type (Psych Patients Only)  Admission Status Voluntary  Psychosocial Assessment  Patient Complaints None  Eye Contact Fair  Facial Expression Sad;Flat  Affect Appropriate to circumstance  Speech Logical/coherent  Interaction Minimal;Childlike  Motor Activity Other (Comment) (WDL)  Appearance/Hygiene Poor hygiene  Behavior Characteristics Cooperative  Mood Pleasant  Thought Process  Coherency WDL  Content WDL  Delusions None reported or observed  Perception WDL  Hallucination None reported or observed  Judgment Impaired  Confusion None  Danger to Self  Current suicidal ideation? Denies  Description of Agreement Verbal  Danger to Others  Danger to Others None reported or observed

## 2022-09-07 NOTE — Group Note (Signed)
Occupational Therapy Group Note  Group Topic:Coping Skills  Group Date: 09/07/2022 Start Time: 1400 End Time: 1440 Facilitators: Brantley Stage, OT   Group Description: Group encouraged increased engagement and participation through discussion and activity focused on "Coping Ahead." Patients were split up into teams and selected a card from a stack of positive coping strategies. Patients were instructed to act out/charade the coping skill for other peers to guess and receive points for their team. Discussion followed with a focus on identifying additional positive coping strategies and patients shared how they were going to cope ahead over the weekend while continuing hospitalization stay.  Therapeutic Goal(s): Identify positive vs negative coping strategies. Identify coping skills to be used during hospitalization vs coping skills outside of hospital/at home Increase participation in therapeutic group environment and promote engagement in treatment   Participation Level: Minimal   Participation Quality: Independent   Behavior: Calm   Speech/Thought Process: Barely audible   Affect/Mood: Appropriate   Insight: Limited   Judgement: Limited   Individualization: pt was passively engaged in their participation of group discussion/activity. New skills identified  Modes of Intervention: Discussion and Education  Patient Response to Interventions:  Attentive   Plan: Continue to engage patient in OT groups 2 - 3x/week.  09/07/2022  Brantley Stage, OT Cornell Barman, OT
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
## 2022-09-07 NOTE — BH IP Treatment Plan (Signed)
Interdisciplinary Treatment and Diagnostic Plan Update  09/07/2022 Time of Session: 1000 Cheryl Adams MRN: [id number]  Principal Diagnosis: Schizoaffective disorder, depressive type [facility])  Secondary Diagnoses: Principal Problem:   Schizoaffective disorder, depressive type (HCC) Active Problems:   GAD (generalized anxiety disorder)   Vitamin D deficiency   Sialorrhea   Current Medications:  Current Facility-Administered Medications  Medication Dose Route Frequency Provider Last Rate Last Admin   acetaminophen (TYLENOL) tablet 500 mg  500 mg Oral Q6H PRN Massengill, Harrold Donath, MD       amLODipine (NORVASC) tablet 10 mg  10 mg Oral Daily Massengill, Nathan, MD   10 mg at 09/07/22 0758   antiseptic oral rinse (BIOTENE) solution 15 mL  15 mL Mouth Rinse 5 X Daily PRN Comer Locket, MD       atenolol (TENORMIN) tablet 12.5 mg  12.5 mg Oral Daily Massengill, Nathan, MD   12.5 mg at 09/07/22 0758   atropine 1 % ophthalmic solution 1 drop  1 drop Sublingual TID Phineas Inches, MD   1 drop at 09/07/22 1153   cloZAPine (CLOZARIL) tablet 200 mg  200 mg Oral QHS Nkwenti, Doris, NP   200 mg at 09/06/22 2101   docusate sodium (COLACE) capsule 100 mg  100 mg Oral BID Massengill, Harrold Donath, MD   100 mg at 09/07/22 0758   ferrous sulfate tablet 325 mg  325 mg Oral Daily Princess Bruins, DO   325 mg at 09/07/22 0758   hydrOXYzine (ATARAX) tablet 25 mg  25 mg Oral TID PRN Phineas Inches, MD   25 mg at 06/17/22 2027   OLANZapine zydis (ZYPREXA) disintegrating tablet 5 mg  5 mg Oral Q8H PRN Massengill, Harrold Donath, MD   5 mg at 05/24/22 2154   And   LORazepam (ATIVAN) tablet 1 mg  1 mg Oral Q8H PRN Massengill, Harrold Donath, MD   1 mg at 07/03/22 2150   And   ziprasidone (GEODON) injection 20 mg  20 mg Intramuscular Q8H PRN Massengill, Harrold Donath, MD       metFORMIN (GLUCOPHAGE-XR) 24 hr tablet 500 mg  500 mg Oral Q breakfast Massengill, Nathan, MD   500 mg at 09/07/22 0758   [START ON 09/08/2022] methylphenidate  (RITALIN LA) 24 hr capsule 20 mg  20 mg Oral Daily Massengill, Nathan, MD       polyethylene glycol (MIRALAX / GLYCOLAX) packet 17 g  17 g Oral Daily Massengill, Nathan, MD   17 g at 09/07/22 0800   sertraline (ZOLOFT) tablet 100 mg  100 mg Oral Daily Massengill, Harrold Donath, MD   100 mg at 09/07/22 8921   vitamin D3 (CHOLECALCIFEROL) tablet 1,000 Units  1,000 Units Oral Daily Massengill, Harrold Donath, MD   1,000 Units at 09/07/22 0758   PTA Medications: Medications Prior to Admission  Medication Sig Dispense Refill Last Dose   ferrous sulfate 325 (65 FE) MG tablet Take 325 mg by mouth daily.      risperiDONE (RISPERDAL) 1 MG tablet Take 1 tablet (1 mg total) by mouth daily.      risperiDONE (RISPERDAL) 2 MG tablet Take 1 tablet (2 mg total) by mouth at bedtime.       Patient Stressors: Health problems   Marital or family conflict   Medication change or noncompliance    Patient Strengths: Average or above average intelligence  Supportive family/friends   Treatment Modalities: Medication Management, Group therapy, Case management,  1 to 1 session with clinician, Psychoeducation, Recreational therapy.   Physician Treatment Plan for  Primary Diagnosis: Schizoaffective disorder, depressive type (HCC) Long Term Goal(s): Improvement in symptoms so as ready for discharge   Short Term Goals: Ability to verbalize feelings will improve Ability to disclose and discuss suicidal ideas Ability to identify and develop effective coping behaviors will improve Compliance with prescribed medications will improve  Medication Management: Evaluate patient's response, side effects, and tolerance of medication regimen.  Therapeutic Interventions: 1 to 1 sessions, Unit Group sessions and Medication administration.  Evaluation of Outcomes: Adequate for Discharge  Physician Treatment Plan for Secondary Diagnosis: Principal Problem:   Schizoaffective disorder, depressive type (HCC) Active Problems:   GAD  (generalized anxiety disorder)   Vitamin D deficiency   Sialorrhea  Long Term Goal(s): Improvement in symptoms so as ready for discharge   Short Term Goals: Ability to verbalize feelings will improve Ability to disclose and discuss suicidal ideas Ability to identify and develop effective coping behaviors will improve Compliance with prescribed medications will improve     Medication Management: Evaluate patient's response, side effects, and tolerance of medication regimen.  Therapeutic Interventions: 1 to 1 sessions, Unit Group sessions and Medication administration.  Evaluation of Outcomes: Adequate for Discharge   RN Treatment Plan for Primary Diagnosis: Schizoaffective disorder, depressive type (HCC) Long Term Goal(s): Knowledge of disease and therapeutic regimen to maintain health will improve  Short Term Goals: Ability to remain free from injury will improve, Ability to verbalize frustration and anger appropriately will improve, Ability to demonstrate self-control, Ability to participate in decision making will improve, Ability to verbalize feelings will improve, Ability to disclose and discuss suicidal ideas, Ability to identify and develop effective coping behaviors will improve, and Compliance with prescribed medications will improve  Medication Management: RN will administer medications as ordered by provider, will assess and evaluate patient's response and provide education to patient for prescribed medication. RN will report any adverse and/or side effects to prescribing provider.  Therapeutic Interventions: 1 on 1 counseling sessions, Psychoeducation, Medication administration, Evaluate responses to treatment, Monitor vital signs and CBGs as ordered, Perform/monitor CIWA, COWS, AIMS and Fall Risk screenings as ordered, Perform wound care treatments as ordered.  Evaluation of Outcomes: Adequate for Discharge   LCSW Treatment Plan for Primary Diagnosis: Schizoaffective  disorder, depressive type (HCC) Long Term Goal(s): Safe transition to appropriate next level of care at discharge, Engage patient in therapeutic group addressing interpersonal concerns.  Short Term Goals: Engage patient in aftercare planning with referrals and resources, Increase social support, Increase ability to appropriately verbalize feelings, Increase emotional regulation, Facilitate acceptance of mental health diagnosis and concerns, Facilitate patient progression through stages of change regarding substance use diagnoses and concerns, and Identify triggers associated with mental health/substance abuse issues  Therapeutic Interventions: Assess for all discharge needs, 1 to 1 time with Social worker, Explore available resources and support systems, Assess for adequacy in community support network, Educate family and significant other(s) on suicide prevention, Complete Psychosocial Assessment, Interpersonal group therapy.  Evaluation of Outcomes: Adequate for Discharge   Progress in Treatment: Attending groups: Yes. Participating in groups: Yes. Taking medication as prescribed: Yes. Toleration medication: Yes. Family/Significant other contact made: Yes, individual(s) contacted:  Sharion Dove, mother Patient understands diagnosis: Yes. Discussing patient identified problems/goals with staff: Yes. Medical problems stabilized or resolved: Yes. Denies suicidal/homicidal ideation: Yes. Issues/concerns per patient self-inventory: Yes. Other: none  New problem(s) identified: No, Describe:  none  New Short Term/Long Term Goal(s): Patient to work towards  elimination of symptoms of psychosis, medication management for mood stabilization; elimination of  SI thoughts; development of comprehensive mental wellness plan.  Patient Goals:  No additional goals identified at this time. Patient to continue to work towards original goals identified in initial treatment team meeting. CSW will remain  available to patient should they voice additional treatment goals.   Discharge Plan or Barriers: Patient continues to lack adequate housing and supervision at discharge. CSW team currently working with [facility] in order to secure placement.   Reason for Continuation of Hospitalization: [facility] is boarding awaiting placement.  Estimated Length of Stay: TBD   Scribe for Treatment Team: Larose Kells 09/07/2022 3:26 PM

## 2022-09-08 DIAGNOSIS — F251 Schizoaffective disorder, depressive type: Secondary | ICD-10-CM | POA: Diagnosis not present

## 2022-09-08 LAB — CBC WITH DIFFERENTIAL/PLATELET
Abs Immature Granulocytes: 0.06 10*3/uL (ref 0.00–0.07)
Basophils Absolute: 0 10*3/uL (ref 0.0–0.1)
Basophils Relative: 1 %
Eosinophils Absolute: 0.2 10*3/uL (ref 0.0–0.5)
Eosinophils Relative: 3 %
HCT: 39.7 % (ref 36.0–46.0)
Hemoglobin: 12.4 g/dL (ref 12.0–15.0)
Immature Granulocytes: 1 %
Lymphocytes Relative: 25 %
Lymphs Abs: 1.4 10*3/uL (ref 0.7–4.0)
MCH: 26 pg (ref 26.0–34.0)
MCHC: 31.2 g/dL (ref 30.0–36.0)
MCV: 83.2 fL (ref 80.0–100.0)
Monocytes Absolute: 0.4 10*3/uL (ref 0.1–1.0)
Monocytes Relative: 8 %
Neutro Abs: 3.5 10*3/uL (ref 1.7–7.7)
Neutrophils Relative %: 62 %
Platelets: 264 10*3/uL (ref 150–400)
RBC: 4.77 MIL/uL (ref 3.87–5.11)
RDW: 14.6 % (ref 11.5–15.5)
WBC: 5.6 10*3/uL (ref 4.0–10.5)
nRBC: 0 % (ref 0.0–0.2)

## 2022-09-08 MED ORDER — METHYLPHENIDATE HCL 10 MG PO TABS
10.0000 mg | ORAL_TABLET | Freq: Every day | ORAL | Status: DC
  Administered 2022-09-09 – 2022-09-10 (×2): 10 mg via ORAL
  Filled 2022-09-08 (×2): qty 1

## 2022-09-08 NOTE — Progress Notes (Signed)
   09/08/22 0542  Sleep  Number of Hours 6.5

## 2022-09-08 NOTE — Progress Notes (Signed)
   09/08/22 1600  Psych Admission Type (Psych Patients Only)  Admission Status Voluntary  Psychosocial Assessment  Patient Complaints None  Eye Contact Avertive  Facial Expression Flat  Affect Appropriate to circumstance  Speech Logical/coherent  Interaction Minimal;Childlike  Motor Activity Other (Comment)  Appearance/Hygiene Poor hygiene  Behavior Characteristics Cooperative  Mood Pleasant  Thought Process  Coherency WDL  Content WDL  Delusions None reported or observed  Perception WDL  Hallucination None reported or observed  Judgment Impaired  Confusion None  Danger to Self  Current suicidal ideation? Denies  Danger to Others  Danger to Others None reported or observed
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
## 2022-09-08 NOTE — Progress Notes (Signed)
The patient rated her day as a 4 out of 10 because she had a "decent" day. Her positive event for the day was going outside for fresh air.

## 2022-09-09 DIAGNOSIS — F251 Schizoaffective disorder, depressive type: Secondary | ICD-10-CM | POA: Diagnosis not present

## 2022-09-09 MED ORDER — AMLODIPINE BESYLATE 10 MG PO TABS: 10.0000 mg | ORAL_TABLET | Freq: Every day | ORAL | Status: DC

## 2022-09-09 MED ORDER — CLOZAPINE 200 MG PO TABS: 200.0000 mg | ORAL_TABLET | Freq: Every day | ORAL | Status: DC

## 2022-09-09 MED ORDER — ATROPINE SULFATE 1 % OP SOLN: 1.0000 [drp] | Freq: Three times a day (TID) | OPHTHALMIC | 12 refills | Status: DC

## 2022-09-09 MED ORDER — BIOTENE DRY MOUTH MT LIQD: 15.0000 mL | Freq: Every day | OROMUCOSAL | Status: DC | PRN

## 2022-09-09 MED ORDER — METHYLPHENIDATE HCL 10 MG PO TABS: 10.0000 mg | ORAL_TABLET | Freq: Every day | ORAL | 0 refills | Status: DC

## 2022-09-09 MED ORDER — POLYETHYLENE GLYCOL 3350 17 G PO PACK: 17.0000 g | PACK | Freq: Every day | ORAL | 0 refills | Status: DC

## 2022-09-09 MED ORDER — ATENOLOL 25 MG PO TABS: 12.5000 mg | ORAL_TABLET | Freq: Every day | ORAL | Status: DC

## 2022-09-09 MED ORDER — VITAMIN D3 25 MCG PO TABS: 1000.0000 [IU] | ORAL_TABLET | Freq: Every day | ORAL | Status: DC

## 2022-09-09 MED ORDER — METFORMIN HCL ER 500 MG PO TB24: 500.0000 mg | ORAL_TABLET | Freq: Every day | ORAL | Status: DC

## 2022-09-09 MED ORDER — SERTRALINE HCL 100 MG PO TABS: 100.0000 mg | ORAL_TABLET | Freq: Every day | ORAL | Status: DC

## 2022-09-09 MED ORDER — DOCUSATE SODIUM 100 MG PO CAPS: 100.0000 mg | ORAL_CAPSULE | Freq: Two times a day (BID) | ORAL | 0 refills | Status: DC

## 2022-09-09 NOTE — [facility] Group Notes (Signed)
Adult Psychoeducational Group Note  Date:  09/09/2022 Time:  10:54 AM  Group Topic/Focus:  Goals Group:   The focus of this group is to help patients establish daily goals to achieve during treatment and discuss how the patient can incorporate goal setting into their daily lives to aide in recovery.  Participation Level:  Active  Participation Quality:  Appropriate  Affect:  Appropriate  Cognitive:  Appropriate  Insight: Appropriate  Engagement in Group:  Engaged  Modes of Intervention:  Education  Kern Reap 09/09/2022, 10:54 AM
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
## 2022-09-09 NOTE — Progress Notes (Signed)
The patient attended the evening N.A.meeting and was appropriate.  

## 2022-09-09 NOTE — Progress Notes (Signed)
   09/08/22 2115  Psych Admission Type (Psych Patients Only)  Admission Status Voluntary  Psychosocial Assessment  Patient Complaints None  Eye Contact Fair  Facial Expression Flat  Affect Appropriate to circumstance  Speech Logical/coherent  Interaction Minimal  Motor Activity Other (Comment) (WDL)  Appearance/Hygiene Improved  Behavior Characteristics Cooperative  Mood Pleasant  Thought Process  Coherency WDL  Content WDL  Delusions None reported or observed  Perception WDL  Hallucination None reported or observed  Judgment Impaired  Confusion None  Danger to Self  Current suicidal ideation? Denies  Danger to Others  Danger to Others None reported or observed

## 2022-09-09 NOTE — Progress Notes (Signed)
PsychoEducational Group. Patients were given education on the power of positive re-framing. Patients were given interactive exercise in which they were asked to use positive reframing in ordinary situations.  Patients were then read poem of the power of thoughts by the Dali Lama and asked to reflect.  Pt attended and was appropriate. 

## 2022-09-09 NOTE — Progress Notes (Signed)
D:  Patient's self inventory sheet, patient has fair sleep, sleep medicine is helpful.  Fair appetite, normal energy level, good concentration.  Rated depression, hopeless and anxiety 4.  Denied withdrawals.  Denied SI.  Denied physical problems.  Denied physical pain.  No discharge plans. A:  Medications administered per MD orders.  Emotional support and encouragement given patient. R:  Denied SI and HI, contracts for safety.  Denied A/V hallucinations.  Safety maintained with 15 minute checks.
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
## 2022-09-09 NOTE — Progress Notes (Signed)
Pt during courtyard time tripped over pavement while walking. Pt landed on knee and braced herself with both hands. Pt returned to unit early and assessed by this Probation officer. No bleeding noted, small scratch to hands but pt denies any pain. Primary RN notified as well as AC.

## 2022-09-09 NOTE — BHH Counselor (Signed)
09/09/2022 at 10:44am: CSW received a voicemail from Mrs. Devoria Albe with [facility] who wanted to confirm that the Pt would be ready at 7:30am to be picked up for her MDE appointment. CSW returned [name]'s phone call but received the voicemail.  CSW left [name] a voicemail confirming that the Pt will be ready at 7:30am.  CSW will place a discharge note in the [name] chart tonight.  Nursing and the Doctors have been informed of this information.

## 2022-09-09 NOTE — Plan of Care (Signed)
Nurse discussed anxiety, depression and coping skills with patient.  

## 2022-09-09 NOTE — Progress Notes (Signed)
  [facility] Adult Case Management Discharge Plan :  Will you be returning to the same living situation after discharge:  Yes,  Will be returning to hospital after MDE on 09/10/2022.  Will be readmitted. At discharge, do you have transportation home?: Yes,  DSS Legal Guardian  Do you have the ability to pay for your medications: Yes,  DSS Legal Guardian   Release of information consent forms completed and in the chart;  Patient's signature needed at discharge.  Patient to Follow up at:  Follow-up [facility] Schedule an appointment as soon as possible for a visit.   Why: Please call to personally reschedule/schedule an appointment with this provider Candee Furbish) for medication management services, per provider's request. Contact information: [place] [address] [phone]         [facility] Follow up.   Why: A referral for [facility] Services has been made for you.  Please contact this agency as soon as poosible after discharge. Contact information: Address: [facility] [employer], [address], [place], [place] [address]  Phone: [phone]        [facility] Follow up.   Why: A referral has been made for you.  Please contact this agency as soon as possible after discharge. Contact information: Address: [address], [place], [place] [address]  Phone: [phone]        [facility] Follow up.   Specialty: Behavioral Health Contact information: [place] [place] [phone]                Next level of care provider has access to [place] and Suicide Prevention discussed: Yes,  with patient and DSS Legal Guardian     Has patient been referred to the Quitline?: N/A patient is not a smoker  Patient has been referred for addiction treatment: [place] [place] 09/09/2022, 3:34 PM

## 2022-09-09 NOTE — Group Note (Signed)
LCSW Group Therapy Note   Group Date: 09/09/2022 Start Time: 1300 End Time: 1400  Type of Therapy and Topic: Group Therapy: Control  Participation Level: Active  Description of Group: In this group patients will discuss what is out of their control, what is somewhat in their control, and what is within their control.  They will be encouraged to explore what issues they can control and what issues are out of their control within their daily lives. They will be guided to discuss their thoughts, feelings, and behaviors related to these issues. The group will process together ways to better control things that are well within our own control and how to notice and accept the things that are not within our control. This group will be process-oriented, with patients participating in exploration of their own experiences as well as giving and receiving support and challenge from other group members.  During this group 2 worksheets will be provided to each patient to follow along and fill out.   Therapeutic Goals: 1. Patient will identify what is within their control and what is not within their control. 2. Patient will identify their thoughts and feelings about having control over their own lives. 3. Patient will identify their thoughts and feelings about not having control over everything in their lives.. 4. Patient will identify ways that they can have more control over their own lives. 5. Patient will identify areas were they can allow others to help them or provide assistance.  Summary of Patient Progress: The Pt attended group and remained there the entire time. The Pt accepted all worksheets and discussed the topic openly and appropriately with their peers.  The Pt was able to discuss what they have control of or do not have control of, the choices that they can make, and the possible consequences that may arise from their choices.  The Pt was able to identify possible anxiety about these things  and how they can manage that anxiety as well.   Cheryl Adams M Lucyann Romano, LCSWA 09/09/2022  1:59 PM    
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
## 2022-09-10 ENCOUNTER — Inpatient Hospital Stay (HOSPITAL_COMMUNITY)
Admission: AD | Admit: 2022-09-10 | Discharge: 2022-10-13 | DRG: 885 | Disposition: A | Discharge status: Home / Self Care | Payer: Medicaid Other | Source: Intra-hospital | Attending: Psychiatry | Admitting: Psychiatry

## 2022-09-10 ENCOUNTER — Encounter (HOSPITAL_COMMUNITY): Payer: Self-pay | Admitting: Psychiatry

## 2022-09-10 ENCOUNTER — Other Ambulatory Visit: Payer: Self-pay

## 2022-09-10 DIAGNOSIS — D509 Iron deficiency anemia, unspecified: Secondary | ICD-10-CM | POA: Diagnosis present

## 2022-09-10 DIAGNOSIS — F259 Schizoaffective disorder, unspecified: Secondary | ICD-10-CM | POA: Diagnosis not present

## 2022-09-10 DIAGNOSIS — I10 Essential (primary) hypertension: Secondary | ICD-10-CM | POA: Diagnosis present

## 2022-09-10 DIAGNOSIS — Z79899 Other long term (current) drug therapy: Secondary | ICD-10-CM

## 2022-09-10 DIAGNOSIS — B379 Candidiasis, unspecified: Secondary | ICD-10-CM | POA: Diagnosis present

## 2022-09-10 DIAGNOSIS — F250 Schizoaffective disorder, bipolar type: Secondary | ICD-10-CM | POA: Diagnosis not present

## 2022-09-10 DIAGNOSIS — F251 Schizoaffective disorder, depressive type: Principal | ICD-10-CM | POA: Diagnosis present

## 2022-09-10 DIAGNOSIS — K5900 Constipation, unspecified: Secondary | ICD-10-CM | POA: Diagnosis present

## 2022-09-10 DIAGNOSIS — R000 Tachycardia, unspecified: Secondary | ICD-10-CM | POA: Diagnosis present

## 2022-09-10 DIAGNOSIS — Z20822 Contact with and (suspected) exposure to covid-19: Secondary | ICD-10-CM | POA: Diagnosis present

## 2022-09-10 DIAGNOSIS — F22 Delusional disorders: Secondary | ICD-10-CM | POA: Diagnosis present

## 2022-09-10 DIAGNOSIS — E559 Vitamin D deficiency, unspecified: Secondary | ICD-10-CM | POA: Diagnosis present

## 2022-09-10 DIAGNOSIS — G4733 Obstructive sleep apnea (adult) (pediatric): Principal | ICD-10-CM

## 2022-09-10 DIAGNOSIS — Z7984 Long term (current) use of oral hypoglycemic drugs: Secondary | ICD-10-CM | POA: Diagnosis not present

## 2022-09-10 DIAGNOSIS — F411 Generalized anxiety disorder: Secondary | ICD-10-CM | POA: Diagnosis present

## 2022-09-10 DIAGNOSIS — K117 Disturbances of salivary secretion: Secondary | ICD-10-CM | POA: Diagnosis present

## 2022-09-10 DIAGNOSIS — Z818 Family history of other mental and behavioral disorders: Secondary | ICD-10-CM | POA: Diagnosis not present

## 2022-09-10 MED ORDER — AMLODIPINE BESYLATE 10 MG PO TABS
10.0000 mg | ORAL_TABLET | Freq: Every day | ORAL | Status: DC
  Administered 2022-09-11 – 2022-10-13 (×33): 10 mg via ORAL
  Filled 2022-09-10 (×36): qty 1

## 2022-09-10 MED ORDER — CLOZAPINE 100 MG PO TABS
200.0000 mg | ORAL_TABLET | Freq: Every day | ORAL | Status: DC
  Administered 2022-09-10 – 2022-09-23 (×14): 200 mg via ORAL
  Filled 2022-09-10 (×17): qty 2

## 2022-09-10 MED ORDER — METFORMIN HCL ER 500 MG PO TB24
500.0000 mg | ORAL_TABLET | Freq: Every day | ORAL | Status: DC
  Administered 2022-09-11 – 2022-10-13 (×33): 500 mg via ORAL
  Filled 2022-09-10 (×35): qty 1

## 2022-09-10 MED ORDER — ATROPINE SULFATE 1 % OP SOLN
1.0000 [drp] | Freq: Three times a day (TID) | OPHTHALMIC | Status: DC
  Administered 2022-09-11 – 2022-10-13 (×96): 1 [drp] via SUBLINGUAL
  Filled 2022-09-10 (×3): qty 2

## 2022-09-10 MED ORDER — BIOTENE DRY MOUTH MT LIQD: 15.0000 mL | Freq: Every day | OROMUCOSAL | Status: DC | PRN

## 2022-09-10 MED ORDER — ACETAMINOPHEN 325 MG PO TABS: 650.0000 mg | ORAL_TABLET | Freq: Four times a day (QID) | ORAL | Status: DC | PRN

## 2022-09-10 MED ORDER — FERROUS SULFATE 325 (65 FE) MG PO TABS
325.0000 mg | ORAL_TABLET | Freq: Every day | ORAL | Status: DC
  Administered 2022-09-11 – 2022-10-13 (×33): 325 mg via ORAL
  Filled 2022-09-10 (×39): qty 1

## 2022-09-10 MED ORDER — ATENOLOL 12.5 MG HALF TABLET
12.5000 mg | ORAL_TABLET | Freq: Every day | ORAL | Status: DC
  Administered 2022-09-11 – 2022-10-13 (×33): 12.5 mg via ORAL
  Filled 2022-09-10 (×35): qty 1

## 2022-09-10 MED ORDER — ALUM & MAG HYDROXIDE-SIMETH 200-200-20 MG/5ML PO SUSP: 30.0000 mL | ORAL | Status: DC | PRN

## 2022-09-10 MED ORDER — MAGNESIUM HYDROXIDE 400 MG/5ML PO SUSP: 30.0000 mL | Freq: Every day | ORAL | Status: DC | PRN

## 2022-09-10 MED ORDER — VITAMIN D3 25 MCG PO TABS
1000.0000 [IU] | ORAL_TABLET | Freq: Every day | ORAL | Status: DC
  Administered 2022-09-11 – 2022-10-13 (×33): 1000 [IU] via ORAL
  Filled 2022-09-10 (×35): qty 1

## 2022-09-10 MED ORDER — DOCUSATE SODIUM 100 MG PO CAPS
100.0000 mg | ORAL_CAPSULE | Freq: Two times a day (BID) | ORAL | Status: DC
  Administered 2022-09-10 – 2022-10-13 (×65): 100 mg via ORAL
  Filled 2022-09-10 (×72): qty 1

## 2022-09-10 MED ORDER — POLYETHYLENE GLYCOL 3350 17 G PO PACK
17.0000 g | PACK | Freq: Every day | ORAL | Status: DC
  Administered 2022-09-11 – 2022-10-13 (×33): 17 g via ORAL
  Filled 2022-09-10 (×35): qty 1

## 2022-09-10 MED ORDER — SERTRALINE HCL 100 MG PO TABS
100.0000 mg | ORAL_TABLET | Freq: Every day | ORAL | Status: DC
  Administered 2022-09-11 – 2022-09-30 (×20): 100 mg via ORAL
  Filled 2022-09-10 (×22): qty 1

## 2022-09-10 MED ORDER — METHYLPHENIDATE HCL 10 MG PO TABS
10.0000 mg | ORAL_TABLET | Freq: Every day | ORAL | Status: DC
  Administered 2022-09-11 – 2022-09-27 (×17): 10 mg via ORAL
  Filled 2022-09-10 (×17): qty 1

## 2022-09-10 NOTE — Progress Notes (Signed)
   09/10/22 0730  Psych Admission Type (Psych Patients Only)  Admission Status Voluntary  Psychosocial Assessment  Patient Complaints Anxiety  Eye Contact Fair  Facial Expression Anxious  Affect Appropriate to circumstance  Speech Logical/coherent  Interaction Childlike  Motor Activity Other (Comment) (steady gait)  Appearance/Hygiene Improved  Behavior Characteristics Cooperative;Anxious  Mood Anxious;Pleasant  Thought Process  Coherency WDL  Content WDL  Delusions None reported or observed  Perception WDL  Hallucination None reported or observed  Judgment Impaired  Confusion None  Danger to Self  Current suicidal ideation? Denies  Danger to Others  Danger to Others None reported or observed

## 2022-09-10 NOTE — Progress Notes (Signed)
Patient returned to unit, and walked down to the dining room with peers for lunch.

## 2022-09-10 NOTE — Progress Notes (Signed)
The patient rated her day as a 4 out of 10 since she went outside and exercised. Her coping skill is to use breathing exercises.

## 2022-09-10 NOTE — Progress Notes (Signed)
Pt discharged to lobby in care of Gracy Racer (DSS, legal guardian). Pt will be readmitted following MDE exam. . Pt was stable and appreciative at that time. Belongings remained in locker per legal guardian's request.  Patient alert and oriented- denied SI/HI and A/VH. Pt given opportunity to express concerns and ask questions.

## 2022-09-10 NOTE — Progress Notes (Signed)
   09/10/22 1300  Psych Admission Type (Psych Patients Only)  Admission Status Voluntary  Psychosocial Assessment  Patient Complaints Anxiety  Eye Contact Fair  Facial Expression Anxious  Affect Appropriate to circumstance  Speech Logical/coherent  Interaction Minimal  Motor Activity Other (Comment) (steady gait)  Appearance/Hygiene Improved  Behavior Characteristics Cooperative;Anxious  Mood Sad  Thought Process  Coherency WDL  Content WDL  Delusions None reported or observed  Perception WDL  Hallucination None reported or observed  Judgment Impaired  Confusion None  Danger to Self  Current suicidal ideation? Denies  Danger to Others  Danger to Others None reported or observed

## 2022-09-11 ENCOUNTER — Encounter (HOSPITAL_COMMUNITY): Payer: Self-pay

## 2022-09-11 DIAGNOSIS — F251 Schizoaffective disorder, depressive type: Secondary | ICD-10-CM | POA: Diagnosis not present

## 2022-09-11 NOTE — Progress Notes (Signed)
   09/11/22 0000  Psych Admission Type (Psych Patients Only)  Admission Status Voluntary  Psychosocial Assessment  Patient Complaints Anxiety;Depression  Eye Contact Fair  Facial Expression Anxious  Affect Appropriate to circumstance  Speech Logical/coherent;Soft  Interaction Childlike  Motor Activity Slow;Other (Comment) (Steady gait)  Appearance/Hygiene Unremarkable  Behavior Characteristics Cooperative;Anxious  Mood Anxious;Depressed  Thought Process  Coherency WDL  Content WDL  Delusions None reported or observed  Perception WDL  Hallucination None reported or observed  Judgment Impaired  Confusion None  Danger to Self  Current suicidal ideation? Denies  Danger to Others  Danger to Others None reported or observed   Patient alert and oriented presenting with an appropriate affect to circumstance and anxious and  depressed mood. Patient denies SI, HI, AVH, and pain and rates anxiety and depression as a 4/10 each. Scheduled medications administered to patient, per provider orders. Support and encouragement provided. Routine safety checks conducted every 15 minutes. Patient contracts for safety at this time and remains safe on the unit.

## 2022-09-11 NOTE — BHH Group Notes (Signed)
Adult Psychoeducational Group Note  Date:  09/11/2022 Time:  2:46 PM  Group Topic/Focus:  Wellness Toolbox:   The focus of this group is to discuss various aspects of wellness, balancing those aspects and exploring ways to increase the ability to experience wellness.  Patients will create a wellness toolbox for use upon discharge.  Participation Level:  Active  Participation Quality:  Appropriate  Affect:  Appropriate  Cognitive:  Alert  Insight: Appropriate  Engagement in Group:  Engaged  Modes of Intervention:  Activity  Additional Comments:  Patient attended and participated in the relaxation group activity.  Annie Sable 09/11/2022, 2:46 PM

## 2022-09-11 NOTE — Group Note (Signed)
LCSW Group Therapy Note   Group Date: 09/11/2022 Start Time: 1300 End Time: 1400  Type of Therapy and Topic:  Group Therapy - Healthy vs Unhealthy Coping Skills  Participation Level:  Active   Description of Group The focus of this group was to determine what unhealthy coping techniques typically are used by group members and what healthy coping techniques would be helpful in coping with various problems. Patients were guided in becoming aware of the differences between healthy and unhealthy coping techniques. Patients were asked to identify 2-3 healthy coping skills they would like to learn to use more effectively.  Therapeutic Goals Patients learned that coping is what human beings do all day long to deal with various situations in their lives Patients defined and discussed healthy vs unhealthy coping techniques Patients identified their preferred coping techniques and identified whether these were healthy or unhealthy Patients determined 2-3 healthy coping skills they would like to become more familiar with and use more often. Patients provided support and ideas to each other   Summary of Patient Progress:  The Pt attended group and remained there the entire time.  The Pt accepted all worksheets and materials that were distributed.  The Pt was appropriate with peers and was able to demonstrate understanding of the situation. The Pt was able to identify both positive and negative coping skills that they have used in the past and will choose to use in the future.    Therapeutic Modalities Cognitive Behavioral Therapy Motivational Interviewing  Darleen Crocker, [place] 09/11/2022  2:12 PM

## 2022-09-11 NOTE — Progress Notes (Signed)
Adult Psychoeducational Group Note  Date:  09/11/2022 Time:  9:34 PM  Group Topic/Focus:  AA Group  Participation Level:  Active  Participation Quality:  Attentive  Affect:  Appropriate  Cognitive:  Appropriate  Insight: Appropriate  Engagement in Group:  Engaged  Modes of Intervention:  Discussion and Support  Additional Comments:    Pt attended and was attentive during the [place] group.  Cheryl Adams 09/11/2022, 9:34 PM

## 2022-09-11 NOTE — Group Note (Signed)
Date:  09/11/2022 Time:  9:38 AM  Group Topic/Focus:  Orientation:   The focus of this group is to educate the patient on the purpose and policies of crisis stabilization and provide a format to answer questions about their admission.  The group details unit policies and expectations of patients while admitted.    Participation Level:  None  Participation Quality:      Affect:  Blunted  Cognitive:      Insight: Limited  Engagement in Group:  Poor  Modes of Intervention:  Discussion  Additional Comments:     Jerrye Beavers 09/11/2022, 9:38 AM

## 2022-09-11 NOTE — Group Note (Signed)
Recreation Therapy Group Note   Group Topic:Team Building  Group Date: 09/11/2022 Start Time: 0930 End Time: 0955 Facilitators: Shacola Schussler-McCall, LRT,CTRS Location: [address] Dayroom   Goal Area(s) Addresses:  Patient will effectively work with peer towards shared goal.  Patient will identify skills used to make activity successful.  Patient will identify how skills used during activity can be applied to reach post d/c goals.   Group Description: [employer]. In teams of 5-6, patients were given 12 craft pipe cleaners. Using the materials provided, patients were instructed to compete again the opposing team(s) to build the tallest free-standing structure from floor level. The activity was timed; difficulty increased by Probation officer as Pharmacist, hospital continued.  Systematically resources were removed with additional directions for example, placing one arm behind their back, working in silence, and shape stipulations. LRT facilitated post-activity discussion reviewing team processes and necessary communication skills involved in completion. Patients were encouraged to reflect how the skills utilized, or not utilized, in this activity can be incorporated to positively impact support systems post discharge.   Affect/Mood: Sleep   Participation Level: N/A     Clinical Observations/Individualized Feedback: Pt was sleep during group session.   Plan: Continue to engage patient in RT group sessions 2-3x/week.   Oshay Stranahan-McCall, LRT,CTRS 09/11/2022 11:35 AM

## 2022-09-11 NOTE — Progress Notes (Signed)
   09/11/22 1200  Psych Admission Type (Psych Patients Only)  Admission Status Voluntary  Psychosocial Assessment  Patient Complaints Depression  Eye Contact Fair  Facial Expression Fixed smile  Affect Appropriate to circumstance  Speech Logical/coherent  Interaction Childlike  Motor Activity Slow  Appearance/Hygiene Unremarkable  Behavior Characteristics Cooperative  Mood Depressed  Thought Process  Coherency WDL  Content WDL  Delusions None reported or observed  Perception WDL  Hallucination None reported or observed  Judgment Impaired  Confusion None  Danger to Self  Current suicidal ideation? Denies  Danger to Others  Danger to Others None reported or observed

## 2022-09-11 NOTE — BH IP Treatment Plan (Signed)
Interdisciplinary Treatment and Diagnostic Plan Update  09/11/2022 Time of Session: [place] MRN: [id number]  Principal Diagnosis: Schizoaffective disorder [facility])  Secondary Diagnoses: Principal Problem:   Schizoaffective disorder ([place]) Active Problems:   GAD (generalized anxiety disorder)   Vitamin D deficiency   Current Medications:  Current Facility-Administered Medications  Medication Dose Route Frequency Provider Last Rate Last Admin   acetaminophen (TYLENOL) tablet 650 mg  650 mg Oral Q6H PRN Massengill, Ovid Curd, MD       alum & mag hydroxide-simeth (MAALOX/MYLANTA) 200-200-20 MG/5ML suspension 30 mL  30 mL Oral Q4H PRN Massengill, Nathan, MD       amLODipine (NORVASC) tablet 10 mg  10 mg Oral Daily Massengill, Nathan, MD   10 mg at 09/11/22 [id number]   antiseptic oral rinse (BIOTENE) solution 15 mL  15 mL Mouth Rinse 5 X Daily PRN Massengill, Ovid Curd, MD       atenolol (TENORMIN) tablet 12.5 mg  12.5 mg Oral Daily Massengill, Nathan, MD   12.5 mg at 09/11/22 0824   atropine 1 % ophthalmic solution 1 drop  1 drop Sublingual TID Janine Limbo, MD   1 drop at 09/11/22 1152   cloZAPine (CLOZARIL) tablet 200 mg  200 mg Oral QHS Massengill, Ovid Curd, MD   200 mg at 09/10/22 2120   docusate sodium (COLACE) capsule 100 mg  100 mg Oral BID Massengill, Ovid Curd, MD   100 mg at 09/11/22 [id number]   ferrous sulfate tablet 325 mg  325 mg Oral Daily Massengill, Ovid Curd, MD   325 mg at 09/11/22 [id number]   magnesium hydroxide (MILK OF MAGNESIA) suspension 30 mL  30 mL Oral Daily PRN Massengill, Ovid Curd, MD       metFORMIN (GLUCOPHAGE-XR) 24 hr tablet 500 mg  500 mg Oral Q breakfast Massengill, Ovid Curd, MD   500 mg at 09/11/22 [id number]   methylphenidate (RITALIN) tablet 10 mg  10 mg Oral Daily Massengill, Ovid Curd, MD   10 mg at 09/11/22 [id number]   polyethylene glycol (MIRALAX / GLYCOLAX) packet 17 g  17 g Oral Daily Massengill, Ovid Curd, MD   17 g at 09/11/22 [id number]   sertraline (ZOLOFT) tablet 100 mg  100 mg Oral  Daily Massengill, Ovid Curd, MD   100 mg at 09/11/22 [id number]   vitamin D3 (CHOLECALCIFEROL) tablet 1,000 Units  1,000 Units Oral Daily Massengill, Ovid Curd, MD   1,000 Units at 09/11/22 [id number]   PTA Medications: Medications Prior to Admission  Medication Sig Dispense Refill Last Dose   amLODipine (NORVASC) 10 MG tablet Take 1 tablet (10 mg total) by mouth daily.      antiseptic oral rinse (BIOTENE) LIQD 15 mLs by Mouth Rinse route 5 (five) times daily as needed for dry mouth.      atenolol (TENORMIN) 25 MG tablet Take 0.5 tablets (12.5 mg total) by mouth daily.      atropine 1 % ophthalmic solution Place 1 drop under the tongue 3 (three) times daily. 2 mL 12    cloZAPine (CLOZARIL) 200 MG tablet Take 1 tablet (200 mg total) by mouth at bedtime.      docusate sodium (COLACE) 100 MG capsule Take 1 capsule (100 mg total) by mouth 2 (two) times daily. 10 capsule 0    ferrous sulfate 325 (65 FE) MG tablet Take 325 mg by mouth daily.      metFORMIN (GLUCOPHAGE-XR) 500 MG 24 hr tablet Take 1 tablet (500 mg total) by mouth daily with breakfast.      methylphenidate (RITALIN) 10 MG  tablet Take 1 tablet (10 mg total) by mouth daily.  0    polyethylene glycol (MIRALAX / GLYCOLAX) 17 g packet Take 17 g by mouth daily. 14 each 0    sertraline (ZOLOFT) 100 MG tablet Take 1 tablet (100 mg total) by mouth daily.      vitamin D3 (CHOLECALCIFEROL) 25 MCG tablet Take 1 tablet (1,000 Units total) by mouth daily. 60 tablet      Patient Stressors:    Patient Strengths:    Treatment Modalities: Medication Management, Group therapy, Case management,  1 to 1 session with clinician, Psychoeducation, Recreational therapy.   Physician Treatment Plan for Primary Diagnosis: Schizoaffective disorder ([place]) Long Term Goal(s): Improvement in symptoms so as ready for discharge   Short Term Goals: Ability to identify changes in lifestyle to reduce recurrence of condition will improve Ability to verbalize feelings will  improve Ability to disclose and discuss suicidal ideas Ability to demonstrate self-control will improve Ability to identify and develop effective coping behaviors will improve Ability to maintain clinical measurements within normal limits will improve Compliance with prescribed medications will improve Ability to identify triggers associated with substance abuse/mental health issues will improve  Medication Management: Evaluate patient's response, side effects, and tolerance of medication regimen.  Therapeutic Interventions: 1 to 1 sessions, Unit Group sessions and Medication administration.  Evaluation of Outcomes: Progressing  Physician Treatment Plan for Secondary Diagnosis: Principal Problem:   Schizoaffective disorder ([place]) Active Problems:   GAD (generalized anxiety disorder)   Vitamin D deficiency  Long Term Goal(s): Improvement in symptoms so as ready for discharge   Short Term Goals: Ability to identify changes in lifestyle to reduce recurrence of condition will improve Ability to verbalize feelings will improve Ability to disclose and discuss suicidal ideas Ability to demonstrate self-control will improve Ability to identify and develop effective coping behaviors will improve Ability to maintain clinical measurements within normal limits will improve Compliance with prescribed medications will improve Ability to identify triggers associated with substance abuse/mental health issues will improve     Medication Management: Evaluate patient's response, side effects, and tolerance of medication regimen.  Therapeutic Interventions: 1 to 1 sessions, Unit Group sessions and Medication administration.  Evaluation of Outcomes: Progressing   RN Treatment Plan for Primary Diagnosis: Schizoaffective disorder ([place]) Long Term Goal(s): Knowledge of disease and therapeutic regimen to maintain health will improve  Short Term Goals: Ability to remain free from injury will improve,  Ability to verbalize frustration and anger appropriately will improve, Ability to demonstrate self-control, Ability to participate in decision making will improve, Ability to verbalize feelings will improve, Ability to disclose and discuss suicidal ideas, Ability to identify and develop effective coping behaviors will improve, and Compliance with prescribed medications will improve  Medication Management: RN will administer medications as ordered by provider, will assess and evaluate patient's response and provide education to patient for prescribed medication. RN will report any adverse and/or side effects to prescribing provider.  Therapeutic Interventions: 1 on 1 counseling sessions, Psychoeducation, Medication administration, Evaluate responses to treatment, Monitor vital signs and CBGs as ordered, Perform/monitor CIWA, COWS, AIMS and Fall Risk screenings as ordered, Perform wound care treatments as ordered.  Evaluation of Outcomes: Progressing   LCSW Treatment Plan for Primary Diagnosis: Schizoaffective disorder ([place]) Long Term Goal(s): Safe transition to appropriate next level of care at discharge, Engage patient in therapeutic group addressing interpersonal concerns.  Short Term Goals: Engage patient in aftercare planning with referrals and resources, Increase social support, Increase ability to  appropriately verbalize feelings, Increase emotional regulation, Facilitate acceptance of mental health diagnosis and concerns, Facilitate patient progression through stages of change regarding substance use diagnoses and concerns, Identify triggers associated with mental health/substance abuse issues, and Increase skills for wellness and recovery  Therapeutic Interventions: Assess for all discharge needs, 1 to 1 time with Social worker, Explore available resources and support systems, Assess for adequacy in community support network, Educate family and significant other(s) on suicide prevention,  Complete Psychosocial Assessment, Interpersonal group therapy.  Evaluation of Outcomes: Progressing   Progress in Treatment: Attending groups: Yes. Participating in groups: Yes. Taking medication as prescribed: Yes. Toleration medication: Yes. Family/Significant other contact made: Yes, individual(s) contacted:  Tobie Lords, mother  Patient understands diagnosis: Yes. Discussing patient identified problems/goals with staff: Yes. Medical problems stabilized or resolved: Yes. Denies suicidal/homicidal ideation: Yes. Issues/concerns per patient self-inventory: Yes. Other: none  New problem(s) identified: No, Describe:  none  New Short Term/Long Term Goal(s): Patient to work towards elimination of symptoms of psychosis, medication management for mood stabilization;  development of comprehensive mental wellness plan.  Patient Goals:  Patient states their goal for treatment is to "everything is alright for right now."  Discharge Plan or Barriers: Patient continues to be a placement issue.   Reason for Continuation of Hospitalization: Patient is boarding   Estimated Length of Stay: TBD    Last [place] Suicide Severity Risk Score: [place] Admission (Discharged) from 05/19/2022 in [place] [facility] ED from 05/18/2022 in [facility] Office Visit from 04/14/2022 in [place] No Risk High Risk No Risk       Last PHQ 2/9 Scores:    04/14/2022   10:46 AM 02/24/2022   11:12 AM 01/13/2022    9:14 AM  Depression screen PHQ 2/9  Decreased Interest 0 0 0  Down, Depressed, Hopeless 0 0 0  PHQ - 2 Score 0 0 0    Scribe for Treatment Team: Larose Kells 09/11/2022 3:08 PM

## 2022-09-11 NOTE — BHH Suicide Risk Assessment (Signed)
[facility] Admission Suicide Risk Assessment   Nursing information obtained from:    Demographic factors:    Current Mental Status:    Loss Factors:    Historical Factors:    Risk Reduction Factors:     Total Time spent with patient: 30 minutes Principal Problem: Schizoaffective disorder ([place]) Diagnosis:  Principal Problem:   Schizoaffective disorder ([place]) Active Problems:   GAD (generalized anxiety disorder)   Vitamin D deficiency  Subjective Data:  Cheryl Adams is a 31 year old African-American female with prior diagnoses of schizoaffective d/o depressive type and GAD who initially presented to the [facility] health urgent care [facility]) accompanied by her mother with complaints of paranoia. Pt was initially admittedon 05-19-2022, for many months, and treated for depression and paranoia with multiple medication and ultimately had partial response to clozapine. DSS was granted legal gaurdianship. On 09-10-2022, pt was d/c for MDE evaluation per court. On 09-10-2022, the pt was  readmitted to the psych unit after the evaluation.   Patient denies any SI or HI.  Patient has poor insight and judgment due to the severity of psychiatric illness that impairs her ability to care for herself or be safe outside of the hospital.   Continued Clinical Symptoms:    The "Alcohol Use Disorders Identification Test", Guidelines for Use in Primary Care, Second Edition.  World Pharmacologist [facility]). Score between 0-7:  no or low risk or alcohol related problems. Score between 8-15:  moderate risk of alcohol related problems. Score between 16-19:  high risk of alcohol related problems. Score 20 or above:  warrants further diagnostic evaluation for alcohol dependence and treatment.   CLINICAL FACTORS:   Schizophrenia:   Paranoid or undifferentiated type   Psychiatric Specialty Exam:  Presentation  General Appearance:  Casual  Eye Contact: Good  Speech: Clear and Coherent; Normal  Rate  Speech Volume: Decreased  Handedness: Right   Mood and Affect  Mood: Euthymic  Affect: Restricted   Thought Process  Thought Processes: Linear  Descriptions of Associations:Intact  Orientation:Full (Time, Place and Person)  Thought Content:Paranoid Ideation  History of Schizophrenia/Schizoaffective disorder:Yes  Duration of Psychotic Symptoms:Greater than six months  Hallucinations:Hallucinations: None  Ideas of Reference:Paranoia  Suicidal Thoughts:Suicidal Thoughts: No  Homicidal Thoughts:Homicidal Thoughts: No   Sensorium  Memory: Immediate Good; Recent Good; Remote Good  Judgment: Impaired  Insight: Lacking   Executive Functions  Concentration: Fair  Attention Span: Fair  Recall: Poor  Fund of Knowledge: Poor  Language: Fair   Psychomotor Activity  Psychomotor Activity: Psychomotor Activity: Normal   Assets  Assets: Communication Skills; Physical Health; Resilience   Sleep  Sleep: Sleep: Good    Physical Exam: Physical Exam ROS Blood pressure (!) 120/103, pulse (!) 104, temperature 99.1 F (37.3 C), temperature source Oral, SpO2 (!) 89 %. There is no height or weight on file to calculate BMI.   COGNITIVE FEATURES THAT CONTRIBUTE TO RISK:  Thought constriction (tunnel vision)    SUICIDE RISK:   Minimal: No identifiable suicidal ideation.  Patients presenting with no risk factors but with morbid ruminations; may be classified as minimal risk based on the severity of the depressive symptoms  PLAN OF CARE: See H&P for assessment, diagnosis list and plan.  I certify that inpatient services furnished can reasonably be expected to improve the patient's condition.   Christoper Allegra, MD 09/11/2022, 12:30 PM

## 2022-09-11 NOTE — H&P (Addendum)
Psychiatric Admission Assessment Adult  Patient Identification: Cheryl Adams MRN:  [id number] Date of Evaluation:  09/11/2022 Chief Complaint:  Schizoaffective disorder (HCC) [F25.9] Principal Diagnosis: Schizoaffective disorder (HCC) Diagnosis:  Principal Problem:   Schizoaffective disorder (HCC) Active Problems:   GAD (generalized anxiety disorder)   Vitamin D deficiency  History of Present Illness:  Cheryl Adams is a 31 year old African-American female with prior diagnoses of schizoaffective d/o depressive type and GAD who [place] [facility] accompanied by her mother with complaints of paranoia. Pt was initially admittedon 05-19-2022, for many months, and treated for depression and paranoia with multiple medication and ultimately had partial response to clozapine. DSS was granted legal gaurdianship. On 09-10-2022, pt was d/c for MDE evaluation per court. On 09-10-2022, the pt was  readmitted to the psych unit after the evaluation.   Current psychiatric medications: - Continue Clozaril 200 qhs - for psychosis.  - Continue Ritalin 10 mg qam - for daytime sedation 2/2 clozapine and negative symptoms of schizophrenia. - Continue Zoloft 100 mg daily for depression  On assessment today, the patient reports that she is still paranoid about her family, and believes that her family would harm her if she were to return home.  She denies having any paranoia about other patients or staff on the unit.  She reports that her mood is okay and not depressed.  Reports sleep is okay.  Reports appetite is okay.  Concentration is okay.  Denies any SI or HI.  Denies AH or VH.  Denies other psychotic symptoms outside of paranoid delusions about her family.  Reports that anxiety is at a low level.  Denies any panic attacks.  Denies any manic symptoms at this time or in the past. She reports last bowel movement was yesterday.  Reports that drooling is less.   Otherwise denies having side effects to currently prescribed psychiatric medications.  Review of symptoms, specific for clozapine: Malaise/Sedation: Denies Chest pain: Denies Shortness of breath: Denies Exertional capacity: WNL Tachycardia: Denies Cough: Denies Sore Throat: Denies Fever: Denies Orthostatic hypotension (dizziness with standing): denies Hypersalivation:  "it's better"   Constipation: Denies (last bm reported to be today)  Symptoms of GERD: Denies Nausea: Denies Nocturnal enuresis: Denies   09-10-2022 -patient was discharged for MDE. She was readmitted on the same day.  08-27-22: MDE is scheduled to [facility] administered [facility] on 09[facility]10-22. [facility]. Pt planned to be dc and readmitted for this.    08-04-22: Per Social Work- Emergent legal guardianship hearing occurred today. Per SW, DSS was awarded emergent guardianship and has requested [name] remain hospitalized at least until her next hearing on 09/29/22.   07-24-22: Family meeting occurred on 07-24-22 at 3 PM, with patient, mother, and patient's uncle, Child psychotherapistsocial worker, Psychologist, occupationalmedical student, and Dr. Judie PetitM.  Patient was extremely paranoid, refused to look at family members.  Refused to hug mother and uncle.  Patient asked to leave the family being due to feeling uncomfortable and scared before the meeting was over.  The patient made it very clear that she did not want to return home to live with any family member, instead she would prefer to go to a shelter or group home.  We discussed process for getting the patient into a group home, after the patient asked to leave.  We encouraged the patient to stay to participate in the discussions but she would not.  The steps are as follows: Obtain legal  guardianship by the mother, apply for disability Medicaid, once disability and Medicaid are approved and in place, group home placement can be applied for.  We also went over history of symptoms.  It appears, as reflected in the  medical record the paranoia started around 2022, but is worse and severely over the past few months.        Total Time spent with patient: 30 minutes  Past Psychiatric History:  MDD, GAD, ultimately diagnosed with schizoaffective disorder depressive type.   Is the patient at risk to self? No.  Has the patient been a risk to self in the past 6 months? Yes.    Has the patient been a risk to self within the distant past? No.  Is the patient a risk to others? No.  Has the patient been a risk to others in the past 6 months? No.  Has the patient been a risk to others within the distant past? Yes.     [place] Scale:  Flowsheet Row Admission (Discharged) from 05/19/2022 in [facility] ED from 05/18/2022 in [facility] Office Visit from 04/14/2022 in [facility]  C-SSRS RISK CATEGORY No Risk High Risk No Risk        Prior Inpatient Therapy:   Prior Outpatient Therapy:    Alcohol Screening:   Substance Abuse History in the last 12 months:  No. Consequences of Substance Abuse: NA Previous Psychotropic Medications: No  Psychological Evaluations: No  Past Medical History:  Past Medical History:  Diagnosis Date   Anemia    Chronic tonsillitis 10/2014   Cough 11/09/2014   Difficulty swallowing pills    No psychiatric disorder found after evaluation 04/14/2022    Past Surgical History:  Procedure Laterality Date   TONSILLECTOMY AND ADENOIDECTOMY Bilateral 11/14/2014   Procedure: BILATERAL TONSILLECTOMY AND ADENOIDECTOMY;  Surgeon: Flo Shanks, MD;  Location: [facility];  Service: ENT;  Laterality: Bilateral;   TYMPANOSTOMY TUBE PLACEMENT     Family History: History reviewed. No pertinent family history. Family Psychiatric  History:  Schizophrenia- Maternal grandfather.   Tobacco Screening:   Social History:  Social History   Substance and Sexual Activity  Alcohol Use No      Social History   Substance and Sexual Activity  Drug Use No    Additional Social History:                           Allergies:  No Known Allergies Lab Results: No results found for this or any previous visit (from the past 48 hour(s)).  Blood Alcohol level:  Lab Results  Component Value Date   ETH <10 10/16/2021   ETH <10 04/24/2021    Metabolic Disorder Labs:  Lab Results  Component Value Date   HGBA1C 5.2 05/18/2022   MPG 102.54 05/18/2022   MPG 120 04/29/2021   Lab Results  Component Value Date   PROLACTIN 18.5 06/18/2022   PROLACTIN 58.3 (H) 06/06/2022   Lab Results  Component Value Date   CHOL 217 (H) 05/18/2022   TRIG 31 05/18/2022   HDL 71 05/18/2022   CHOLHDL 3.1 05/18/2022   VLDL 6 05/18/2022   LDLCALC 140 (H) 05/18/2022   LDLCALC 94 05/02/2021    Current Medications: Current Facility-Administered Medications  Medication Dose Route Frequency Provider Last Rate Last Admin   acetaminophen (TYLENOL) tablet 650 mg  650 mg Oral Q6H PRN Keely Drennan, Harrold Donath,  MD       alum & mag hydroxide-simeth (MAALOX/MYLANTA) 200-200-20 MG/5ML suspension 30 mL  30 mL Oral Q4H PRN Brooklee Michelin, Harrold Donath, MD       amLODipine (NORVASC) tablet 10 mg  10 mg Oral Daily Ahuva Poynor, MD   10 mg at 09/11/22 9381   antiseptic oral rinse (BIOTENE) solution 15 mL  15 mL Mouth Rinse 5 X Daily PRN Randall Colden, Harrold Donath, MD       atenolol (TENORMIN) tablet 12.5 mg  12.5 mg Oral Daily Frank Pilger, MD   12.5 mg at 09/11/22 0824   atropine 1 % ophthalmic solution 1 drop  1 drop Sublingual TID Phineas Inches, MD   1 drop at 09/11/22 1152   cloZAPine (CLOZARIL) tablet 200 mg  200 mg Oral QHS Corry Storie, Harrold Donath, MD   200 mg at 09/10/22 2120   docusate sodium (COLACE) capsule 100 mg  100 mg Oral BID Daysi Boggan, Harrold Donath, MD   100 mg at 09/11/22 0175   ferrous sulfate tablet 325 mg  325 mg Oral Daily Yamili Lichtenwalner, Harrold Donath, MD   325 mg at 09/11/22 1025   magnesium hydroxide  (MILK OF MAGNESIA) suspension 30 mL  30 mL Oral Daily PRN Torre Schaumburg, Harrold Donath, MD       metFORMIN (GLUCOPHAGE-XR) 24 hr tablet 500 mg  500 mg Oral Q breakfast [facility], Harrold Donath, MD   500 mg at 09/11/22 8527   methylphenidate (RITALIN) tablet 10 mg  10 mg Oral Daily Aasha Dina, Harrold Donath, MD   10 mg at 09/11/22 7824   polyethylene glycol (MIRALAX / GLYCOLAX) packet 17 g  17 g Oral Daily Kairah Leoni, Harrold Donath, MD   17 g at 09/11/22 2353   sertraline (ZOLOFT) tablet 100 mg  100 mg Oral Daily Ania Levay, Harrold Donath, MD   100 mg at 09/11/22 6144   vitamin D3 (CHOLECALCIFEROL) tablet 1,000 Units  1,000 Units Oral Daily Tessy Pawelski, Harrold Donath, MD   1,000 Units at 09/11/22 3154   PTA Medications: Medications Prior to Admission  Medication Sig Dispense Refill Last Dose   amLODipine (NORVASC) 10 MG tablet Take 1 tablet (10 mg total) by mouth daily.      antiseptic oral rinse (BIOTENE) LIQD 15 mLs by Mouth Rinse route 5 (five) times daily as needed for dry mouth.      atenolol (TENORMIN) 25 MG tablet Take 0.5 tablets (12.5 mg total) by mouth daily.      atropine 1 % ophthalmic solution Place 1 drop under the tongue 3 (three) times daily. 2 mL 12    cloZAPine (CLOZARIL) 200 MG tablet Take 1 tablet (200 mg total) by mouth at bedtime.      docusate sodium (COLACE) 100 MG capsule Take 1 capsule (100 mg total) by mouth 2 (two) times daily. 10 capsule 0    ferrous sulfate 325 (65 FE) MG tablet Take 325 mg by mouth daily.      metFORMIN (GLUCOPHAGE-XR) 500 MG 24 hr tablet Take 1 tablet (500 mg total) by mouth daily with breakfast.      methylphenidate (RITALIN) 10 MG tablet Take 1 tablet (10 mg total) by mouth daily.  0    polyethylene glycol (MIRALAX / GLYCOLAX) 17 g packet Take 17 g by mouth daily. 14 each 0    sertraline (ZOLOFT) 100 MG tablet Take 1 tablet (100 mg total) by mouth daily.      vitamin D3 (CHOLECALCIFEROL) 25 MCG tablet Take 1 tablet (1,000 Units total) by mouth daily. 60 tablet       Musculoskeletal: Strength &  Muscle Tone: within normal limits Gait & Station: normal Patient leans: N/A            Psychiatric Specialty Exam:  Presentation  General Appearance:  Casual  Eye Contact: Good  Speech: Clear and Coherent; Normal Rate  Speech Volume: Decreased  Handedness: Right   Mood and Affect  Mood: Euthymic  Affect: Restricted   Thought Process  Thought Processes: Linear  Duration of Psychotic Symptoms: Greater than six months  Past Diagnosis of Schizophrenia or Psychoactive disorder: Yes  Descriptions of Associations:Intact  Orientation:Full (Time, Place and Person)  Thought Content:Paranoid Ideation  Hallucinations:Hallucinations: None  Ideas of Reference:Paranoia  Suicidal Thoughts:Suicidal Thoughts: No  Homicidal Thoughts:Homicidal Thoughts: No   Sensorium  Memory: Immediate Good; Recent Good; Remote Good  Judgment: Impaired  Insight: Lacking   Executive Functions  Concentration: Fair  Attention Span: Fair  Recall: Poor  Fund of Knowledge: Poor  Language: Fair   Psychomotor Activity  Psychomotor Activity:Psychomotor Activity: Normal   Assets  Assets: Communication Skills; Physical Health; Resilience   Sleep  Sleep:Sleep: Good    Physical Exam: Physical Exam Vitals reviewed.  Pulmonary:     Effort: Pulmonary effort is normal.  Neurological:     Mental Status: She is alert.     Motor: No weakness.     Gait: Gait normal.    Review of Systems  Neurological:  Negative for dizziness, tingling, tremors and headaches.  Psychiatric/Behavioral:  Negative for depression, hallucinations, substance abuse and suicidal ideas. The patient is not nervous/anxious and does not have insomnia.    Blood pressure (!) 120/103, pulse (!) 104, temperature 99.1 F (37.3 C), temperature source Oral, SpO2 (!) 89 %. There is no height or weight on file to calculate BMI.  Treatment Plan  Summary: Daily contact with patient to assess and evaluate symptoms and progress in treatment  Continue inpatient hospitalization.    Diagnoses Schizoaffective disorder, depressive type Insomnia (resolved)  GAD  Vitamin D deficiency - resolved with replacement    PLAN Safety and Monitoring: Voluntary admission to inpatient psychiatric unit for safety, stabilization and treatment Daily contact with patient to assess and evaluate symptoms and progress in treatment Patient's discussed in multi-disciplinary team meeting Observation Level : q15 minute checks Vital signs: q12 hours Precautions: Safety   2. Medications - Continue Clozaril 200 qhs - for psychosis.  - Continue atropine drops 1% 1 drop tid for sialorrhea.  - Continue Ritalin 10 mg qam - for daytime sedation 2/2 clozapine and negative symptoms of schizophrenia. - Continue Zoloft 100 mg daily for depression - Continue Norvasc 10 mg daily for hypertension - Continue atenolol 12.5 mg once daily - for tachycardia 2/2 clozapine  - Continue Colace 100 mg 2 twice daily for constipation - Continue Miralax daily for constipation - Continue Ensure TID for nutritional support - Continue Ferrous Sulfate 325 mg daily for iron replacement - Continue Atarax 25 mg TID PRN for anxiety - Continue Metformin 500 mg XL for weight gain prophylaxis  - Previously discontinued 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  -Previously d/c geodon, caplyta, thorazine, zyprexa, risperdal due to no improvement (also concern for cheeking during previous antipsychotic medication trials)  - Previously DISCONTINUED klonopin for anxiety associated with paranoid thoughts.  - Completed - Diflucan 150 mg po daily for yeast infection. - Completed - Megace for AUB      Behavior Plan: -patient would benefit from a regular daily structure similar to that she might expect. A [facility] staff has discussed with patient that it  is expected of all adults things like hygiene, clothes washing,  keeping up their living area. Per RN patient requires prompting. Encourage patient to shower every other day, wash own clothing, and clean own room regularly for improved transition to independence.    Other PRNS -Continue Tylenol 650 mg every 6 hours PRN for mild pain -Continue Maalox 30 mg every 4 hrs PRN for indigestion -Continue Milk of Magnesia as needed every 6 hrs for constipation   Discharge Planning: Social work and case management to assist with discharge planning and identification of hospital follow-up needs prior to discharge Estimated LOS: dc not before 09-29-22 per court Discharge Concerns: Need to establish a safety plan; Medication compliance and effectiveness Discharge Goals: Return home with outpatient referrals for mental health follow-up including medication management/psychotherapy     Observation Level/Precautions:  15 minute checks  Laboratory: See above, weekly CBC-D for clozapine rems  Psychotherapy:    Medications:    Consultations:    Discharge Concerns:    Estimated LOS: Discharge no earlier than October 31  Other:     Physician Treatment Plan for Primary Diagnosis: Schizoaffective disorder [facility])  Physician Treatment Plan for Secondary Diagnosis: Principal Problem:   Schizoaffective disorder ([place]) Active Problems:   GAD (generalized anxiety disorder)   Vitamin D deficiency  Long Term Goal(s): Improvement in symptoms so as ready for discharge  Short Term Goals: Ability to identify changes in lifestyle to reduce recurrence of condition will improve, Ability to verbalize feelings will improve, Ability to disclose and discuss suicidal ideas, Ability to demonstrate self-control will improve, Ability to identify and develop effective coping behaviors will improve, Ability to maintain clinical measurements within normal limits will improve, Compliance with prescribed medications will improve, and Ability to identify triggers associated with substance  abuse/mental health issues will improve  I certify that inpatient services furnished can reasonably be expected to improve the patient's condition.    Christoper Allegra, MD 10/13/202312:19 PM   Total Time Spent in Direct Patient Care:  I personally spent 45 minutes on the unit in direct patient care. The direct patient care time included face-to-face time with the patient, reviewing the patient's chart, communicating with other professionals, and coordinating care. Greater than 50% of this time was spent in counseling or coordinating care with the patient regarding goals of hospitalization, psycho-education, and discharge planning needs.   Janine Limbo, MD Psychiatrist

## 2022-09-12 DIAGNOSIS — F251 Schizoaffective disorder, depressive type: Secondary | ICD-10-CM | POA: Diagnosis not present

## 2022-09-12 NOTE — [facility] Group Notes (Signed)
Adult Psychoeducational Group Note  Date:  09/12/2022 Time:  9:26 PM  Group Topic/Focus:  Wrap-Up Group:   The focus of this group is to help patients review their daily goal of treatment and discuss progress on daily workbooks.  Participation Level:  Active  Participation Quality:  Appropriate  Affect:  Appropriate  Cognitive:  Appropriate  Insight: Appropriate  Engagement in Group:  Engaged  Modes of Intervention:  Discussion and Support  Additional Comments:  Pt attended and engaged in wrap up group. Pt goal was to focus on meditation and getting through the day. Pt rated her day a 4/10.   Cheryl Adams 09/12/2022, 9:26 PM

## 2022-09-12 NOTE — Progress Notes (Signed)
[facility] MD Progress Note  09/12/2022 2:46 PM Cheryl Adams  MRN:  [id number]  Principal Problem: Schizoaffective disorder (HCC) Diagnosis: Principal Problem:   Schizoaffective disorder (HCC) Active Problems:   GAD (generalized anxiety disorder)   Vitamin D deficiency  Cheryl Adams is a 31 year old African-American female with prior diagnoses of schizoaffective d/o depressive type and GAD who initially presented to the [facility] urgent care [facility]) accompanied by her mother with complaints of paranoia. Pt was initially admittedon 05-19-2022, for many months, and treated for depression and paranoia with multiple medication and ultimately had partial response to clozapine. DSS was granted legal gaurdianship. On 09-10-2022, pt was d/c for MDE evaluation per court. On 09-10-2022, the pt was  readmitted to the psych unit after the evaluation.   Interval History Patient was seen today for re-evaluation.  Nursing reports no events overnight. The patient has no issues with performing ADLs.  Patient has been medication compliant.    Patient was seen and interviewed by attending psychiatrist. Chart reviewed. Patient discussed during treatment team rounds.  Subjective:  On assessment patient reports feeling "I am good". She reports good mood, denies feeling depressed, anxious, panicky. She reports good sleep and appetite. She denies auditory or visual hallucinations. She continues reports feeling paranoid about her family, and believes that her family would harm her if she returns home. She denies thoughts or plans of hurting self or others. She reports no side effects from medications he is getting here. She denies any physical complaints.  Labs: no new results for review.     Past Psychiatric History: see H&P   Past Medical History:  Past Medical History:  Diagnosis Date   Anemia    Chronic tonsillitis 10/2014   Cough 11/09/2014   Difficulty swallowing pills    No psychiatric  disorder found after evaluation 04/14/2022    Past Surgical History:  Procedure Laterality Date   TONSILLECTOMY AND ADENOIDECTOMY Bilateral 11/14/2014   Procedure: BILATERAL TONSILLECTOMY AND ADENOIDECTOMY;  Surgeon: Flo Shanks, MD;  Location: [facility];  Service: ENT;  Laterality: Bilateral;   TYMPANOSTOMY TUBE PLACEMENT     Family History: History reviewed. No pertinent family history. Family Psychiatric  History: see H&*P  Social History:  Social History   Substance and Sexual Activity  Alcohol Use No     Social History   Substance and Sexual Activity  Drug Use No    Social History   Socioeconomic History   Marital status: Single    Spouse name: Not on file   Number of children: Not on file   Years of education: Not on file   Highest education level: Not on file  Occupational History   Not on file  Tobacco Use   Smoking status: Never   Smokeless tobacco: Never  Substance and Sexual Activity   Alcohol use: No   Drug use: No   Sexual activity: Not on file  Other Topics Concern   Not on file  Social History Narrative   Not on file   Social Determinants of Health   Financial Resource Strain: Not on file  Food Insecurity: Unknown (09/10/2022)   Hunger Vital Sign    Worried About Running Out of Food in the Last Year: Patient refused    Ran Out of Food in the Last Year: Patient refused  Transportation Needs: Unknown (09/10/2022)   PRAPARE - Administrator, Civil Service (Medical): Patient refused    Lack of Transportation (Non-Medical): Patient refused  Physical Activity: Not on file  Stress: Not on file  Social Connections: Not on file   Additional Social History:                         Sleep: Good  Appetite:  Good  Current Medications: Current Facility-Administered Medications  Medication Dose Route Frequency Provider Last Rate Last Admin   acetaminophen (TYLENOL) tablet 650 mg  650 mg Oral Q6H PRN Massengill,  Nathan, MD       alum & mag hydroxide-simeth (MAALOX/MYLANTA) 200-200-20 MG/5ML suspension 30 mL  30 mL Oral Q4H PRN Massengill, Nathan, MD       amLODipine (NORVASC) tablet 10 mg  10 mg Oral Daily Massengill, Nathan, MD   10 mg at 09/12/22 4098   antiseptic oral rinse (BIOTENE) solution 15 mL  15 mL Mouth Rinse 5 X Daily PRN Massengill, Ovid Curd, MD       atenolol (TENORMIN) tablet 12.5 mg  12.5 mg Oral Daily Massengill, Nathan, MD   12.5 mg at 09/12/22 1191   atropine 1 % ophthalmic solution 1 drop  1 drop Sublingual TID Massengill, Ovid Curd, MD   1 drop at 09/12/22 1300   cloZAPine (CLOZARIL) tablet 200 mg  200 mg Oral QHS Massengill, Ovid Curd, MD   200 mg at 09/11/22 2125   docusate sodium (COLACE) capsule 100 mg  100 mg Oral BID Massengill, Ovid Curd, MD   100 mg at 09/12/22 4782   ferrous sulfate tablet 325 mg  325 mg Oral Daily Massengill, Ovid Curd, MD   325 mg at 09/12/22 9562   magnesium hydroxide (MILK OF MAGNESIA) suspension 30 mL  30 mL Oral Daily PRN Massengill, Ovid Curd, MD       metFORMIN (GLUCOPHAGE-XR) 24 hr tablet 500 mg  500 mg Oral Q breakfast Massengill, Ovid Curd, MD   500 mg at 09/12/22 1308   methylphenidate (RITALIN) tablet 10 mg  10 mg Oral Daily Massengill, Ovid Curd, MD   10 mg at 09/12/22 6578   polyethylene glycol (MIRALAX / GLYCOLAX) packet 17 g  17 g Oral Daily Massengill, Ovid Curd, MD   17 g at 09/12/22 0813   sertraline (ZOLOFT) tablet 100 mg  100 mg Oral Daily Massengill, Ovid Curd, MD   100 mg at 09/12/22 4696   vitamin D3 (CHOLECALCIFEROL) tablet 1,000 Units  1,000 Units Oral Daily Janine Limbo, MD   1,000 Units at 09/12/22 2952    Lab Results: No results found for this or any previous visit (from the past 48 hour(s)).  Blood Alcohol level:  Lab Results  Component Value Date   ETH <10 10/16/2021   ETH <10 84/13/2440    Metabolic Disorder Labs: Lab Results  Component Value Date   HGBA1C 5.2 05/18/2022   MPG 102.54 05/18/2022   MPG 120 04/29/2021   Lab Results   Component Value Date   PROLACTIN 18.5 06/18/2022   PROLACTIN 58.3 (H) 06/06/2022   Lab Results  Component Value Date   CHOL 217 (H) 05/18/2022   TRIG 31 05/18/2022   HDL 71 05/18/2022   CHOLHDL 3.1 05/18/2022   VLDL 6 05/18/2022   LDLCALC 140 (H) 05/18/2022   LDLCALC 94 05/02/2021    Physical Findings: AIMS: Facial and Oral Movements Muscles of Facial Expression: None, normal Lips and Perioral Area: None, normal Jaw: None, normal Tongue: None, normal,Extremity Movements Upper (arms, wrists, hands, fingers): None, normal Lower (legs, knees, ankles, toes): None, normal, Trunk Movements Neck, shoulders, hips: None, normal, Overall Severity Severity of abnormal  movements (highest score from questions above): None, normal Incapacitation due to abnormal movements: None, normal Patient's awareness of abnormal movements (rate only patient's report): No Awareness, Dental Status Current problems with teeth and/or dentures?: No Does patient usually wear dentures?: No  CIWA:    COWS:     Musculoskeletal: Strength & Muscle Tone: within normal limits Gait & Station: normal Patient leans: N/A  Psychiatric Specialty Exam:   Appearance:  AAF, appearing stated age,  wearing appropriate to the situation hospital clothes, with fair grooming and hygiene. Normal level of alertness and appropriate facial expression.  Attitude/Behavior: calm, cooperative, engaging with appropriate eye contact.  Motor: WNL; dyskinesias not evident. Gait appears in full range.  Speech: spontaneous, clear, coherent, normal comprehension.  Mood: euthymic, " okay ".  Affect: restricted.  Thought process: patient appears coherent,  concrete but linear with questions  Thought content: expresses paranoid delusions; patient denies suicidal thoughts, denies homicidal thoughts.  Thought perception: patient denies auditory and visual hallucinations. Did not appear internally stimulated.  Cognition: patient is  alert and oriented in self, place.  Insight: lacking  Judgement: impaired   Physical Exam: Physical Exam ROS Blood pressure 131/79, pulse 93, temperature 99 F (37.2 C), temperature source Oral, resp. rate 16, SpO2 100 %. There is no height or weight on file to calculate BMI.   Treatment Plan Summary: Daily contact with patient to assess and evaluate symptoms and progress in treatment and Medication management  Patient is a 31 year old female with the above-stated past psychiatric history who is seen in follow-up.  Chart reviewed. Patient discussed with nursing. Patient remains paranoid. No other concerns.  Diagnoses/ Active problems: Schizoaffective disorder, depressive type Insomnia (resolved)  GAD  Vitamin D deficiency - resolved with replacement    PLAN Safety and Monitoring: Voluntary admission to inpatient psychiatric unit for safety, stabilization and treatment Daily contact with patient to assess and evaluate symptoms and progress in treatment Patient's discussed in multi-disciplinary team meeting Observation Level : q15 minute checks Vital signs: q12 hours Precautions: Safety   2. Medications - Continue Clozaril 200 qhs - for psychosis.  - Continue atropine drops 1% 1 drop tid for sialorrhea.  - Continue Ritalin 10 mg qam - for daytime sedation 2/2 clozapine and negative symptoms of schizophrenia. - Continue Zoloft 100 mg daily for depression - Continue Norvasc 10 mg daily for hypertension - Continue atenolol 12.5 mg once daily - for tachycardia 2/2 clozapine  - Continue Colace 100 mg 2 twice daily for constipation - Continue Miralax daily for constipation - Continue Ensure TID for nutritional support - Continue Ferrous Sulfate 325 mg daily for iron replacement - Continue Atarax 25 mg TID PRN for anxiety - Continue Metformin 500 mg XL for weight gain prophylaxis  - Previously discontinued 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  -Previously d/c geodon, caplyta, thorazine,  zyprexa, risperdal due to no improvement (also concern for cheeking during previous antipsychotic medication trials)  - Previously DISCONTINUED klonopin for anxiety associated with paranoid thoughts.  - Completed - Diflucan 150 mg po daily for yeast infection. - Completed - Megace for AUB      Behavior Plan: -patient would benefit from a regular daily structure similar to that she might expect. A [facility] staff has discussed with patient that it is expected of all adults things like hygiene, clothes washing, keeping up their living area. Per RN patient requires prompting. Encourage patient to shower every other day, wash own clothing, and clean own room regularly for improved transition to independence.  Other PRNS -Continue Tylenol 650 mg every 6 hours PRN for mild pain -Continue Maalox 30 mg every 4 hrs PRN for indigestion -Continue Milk of Magnesia as needed every 6 hrs for constipation   Discharge Planning: Social work and case management to assist with discharge planning and identification of hospital follow-up needs prior to discharge Estimated LOS: dc not before 09-29-22 per court Discharge Concerns: Need to establish a safety plan; Medication compliance and effectiveness Discharge Goals: Return home with outpatient referrals for mental health follow-up including medication management/psychotherapy   Total Time Spent in Direct Patient Care:  I personally spent 35 minutes on the unit in direct patient care. The direct patient care time included face-to-face time with the patient, reviewing the patient's chart, communicating with other professionals, and coordinating care. Greater than 50% of this time was spent in counseling or coordinating care with the patient regarding goals of hospitalization, psycho-education, and discharge planning needs.    Thalia Party, MD 09/12/2022, 2:46 PM

## 2022-09-12 NOTE — Progress Notes (Addendum)
D. Pt has been calm and cooperative, minimal, forwards little during interactions, but has been pleasant. Pt has been visible in the milieu, observed attending groups. Per pt's self inventory, pt rated her depression,hopelessness and anxiety all 4's Pt currently denies SI/HI and AVH  A. Labs and vitals monitored. Pt has been compliant with medications- and denies side effects.  Pt supported emotionally and encouraged to express concerns and ask questions.   R. Pt remains safe with 15 minute checks. Will continue POC.

## 2022-09-12 NOTE — Progress Notes (Signed)
   09/11/22 2300  Psych Admission Type (Psych Patients Only)  Admission Status Voluntary  Psychosocial Assessment  Patient Complaints Depression  Eye Contact Fair  Facial Expression Flat  Affect Flat  Speech Soft  Interaction Minimal  Motor Activity Slow  Appearance/Hygiene Unremarkable  Behavior Characteristics Cooperative  Mood Depressed  Thought Process  Coherency WDL  Content WDL  Delusions None reported or observed  Perception WDL  Hallucination None reported or observed  Judgment Impaired  Confusion None  Danger to Self  Current suicidal ideation? Denies  Self-Injurious Behavior No self-injurious ideation or behavior indicators observed or expressed   Agreement Not to Harm Self Yes  Description of Agreement Verbal  Danger to Others  Danger to Others None reported or observed

## 2022-09-12 NOTE — Group Note (Signed)
LCSW Group Therapy Note  09/12/2022      Topic:  Anger Healthy and Unhealthy Coping Skills  Participation Level:  None  Description of Group:   In this group, patients identified their own common triggers and typical reactions then analyzed how these reactions are possibly beneficial and possibly unhelpful.  Focus was placed on examining whether typical coping skills are healthy or unhealthy.  Therapeutic Goals: Patients will share situations that commonly incite their anger and how they typically respond Patients will identify how their coping skills work for them and/or against them Patients will explore possible alternative coping skills Patients will learn that anger itself is normal and that healthier reactions can assist with resolving conflict rather than worsening situations  Summary of Patient Progress:  The patient shared that her frequent cause of anger is somebody being rude.  Choice of coping skill is often to walk away.  She went to sleep leaning against the wall and did not participate in the discussion about whether this was a healthy or unhealthy coping technique.  Therapeutic Modalities:   Cognitive Behavioral Therapy Processing  Maretta Los, LCSW

## 2022-09-13 DIAGNOSIS — F251 Schizoaffective disorder, depressive type: Secondary | ICD-10-CM | POA: Diagnosis not present

## 2022-09-13 NOTE — Progress Notes (Signed)
[facility] MD Progress Note  09/13/2022 8:01 AM Cheryl Adams  MRN:  [id number]  Principal Problem: Schizoaffective disorder ([place]) Diagnosis: Principal Problem:   Schizoaffective disorder ([place]) Active Problems:   GAD (generalized anxiety disorder)   Vitamin D deficiency   Reason for Admission:  Cheryl Adams is a 31 year old African-American female with prior diagnoses of schizoaffective d/o depressive type and GAD who initially presented to the [facility] health urgent care [facility]) accompanied by her mother with complaints of paranoia. Pt was initially admittedon 05-19-2022, for many months, and treated for depression and paranoia with multiple medication and ultimately had partial response to clozapine. DSS was granted legal gaurdianship. On 09-10-2022, pt was d/c for MDE evaluation per court. On 09-10-2022, the pt was  readmitted to the psych unit after the evaluation.  (admitted on 09/10/2022, total  LOS: 3 days )  Chart Review from last 24 hours:  The patient's chart was reviewed and nursing notes were reviewed. The patient's case was discussed in multidisciplinary team meeting.   - Patient received her scheduled medications. - Patient received the following PRN medications: none  Information Obtained Today During Patient Interview: The patient was seen and evaluated on the unit. On assessment today the patient reports good sleep and good appetite.  Patient says she is planning to go to group home or shelter when she leaves [facility], as her home is not a safe place to go to.  I encouraged the patient to continue attending groups and she was amenable to this suggestion.  The patient experiences no hallucinations. The patient describes persecutory delusions, specifically her family being out to harm her ; she denies thought insertion, denies thought withdrawal, denies thought interruption, and denies thought broadcasting.  Patient denies active suicidal intent and denies passive  suicidal ideation; she denies homicidal intent.  Patient denies any side-effects attributable to medications. Patient denies any somatic complaints  Past Psychiatric History:  Past Surgical History:  Procedure Laterality Date   TONSILLECTOMY AND ADENOIDECTOMY Bilateral 11/14/2014   Procedure: BILATERAL TONSILLECTOMY AND ADENOIDECTOMY;  Surgeon: Jodi Marble, MD;  Location: [place];  Service: ENT;  Laterality: Bilateral;   TYMPANOSTOMY TUBE PLACEMENT     Past Medical History:  Past Medical History:  Diagnosis Date   Anemia    Chronic tonsillitis 10/2014   Cough 11/09/2014   Difficulty swallowing pills    No psychiatric disorder found after evaluation 04/14/2022   Family History: History reviewed. No pertinent family history. Family Psychiatric History: per HPI Social History: per HPI  Current Medications: Current Facility-Administered Medications  Medication Dose Route Frequency Provider Last Rate Last Admin   acetaminophen (TYLENOL) tablet 650 mg  650 mg Oral Q6H PRN Massengill, Nathan, MD       alum & mag hydroxide-simeth (MAALOX/MYLANTA) 200-200-20 MG/5ML suspension 30 mL  30 mL Oral Q4H PRN Massengill, Nathan, MD       amLODipine (NORVASC) tablet 10 mg  10 mg Oral Daily Massengill, Nathan, MD   10 mg at 09/12/22 7035   antiseptic oral rinse (BIOTENE) solution 15 mL  15 mL Mouth Rinse 5 X Daily PRN Massengill, Ovid Curd, MD       atenolol (TENORMIN) tablet 12.5 mg  12.5 mg Oral Daily Massengill, Nathan, MD   12.5 mg at 09/12/22 0812   atropine 1 % ophthalmic solution 1 drop  1 drop Sublingual TID Janine Limbo, MD   1 drop at 09/12/22 1838   cloZAPine (CLOZARIL) tablet 200 mg  200 mg Oral QHS  Janine Limbo, MD   200 mg at 09/12/22 2126   docusate sodium (COLACE) capsule 100 mg  100 mg Oral BID Massengill, Ovid Curd, MD   100 mg at 09/12/22 [id number]   ferrous sulfate tablet 325 mg  325 mg Oral Daily Massengill, Ovid Curd, MD   325 mg at 09/12/22 [id number]   magnesium  hydroxide (MILK OF MAGNESIA) suspension 30 mL  30 mL Oral Daily PRN Massengill, Ovid Curd, MD       metFORMIN (GLUCOPHAGE-XR) 24 hr tablet 500 mg  500 mg Oral Q breakfast Massengill, Ovid Curd, MD   500 mg at 09/12/22 [id number]   methylphenidate (RITALIN) tablet 10 mg  10 mg Oral Daily Massengill, Ovid Curd, MD   10 mg at 09/12/22 [id number]   polyethylene glycol (MIRALAX / GLYCOLAX) packet 17 g  17 g Oral Daily Massengill, Ovid Curd, MD   17 g at 09/12/22 0813   sertraline (ZOLOFT) tablet 100 mg  100 mg Oral Daily Massengill, Ovid Curd, MD   100 mg at 09/12/22 [id number]   vitamin D3 (CHOLECALCIFEROL) tablet 1,000 Units  1,000 Units Oral Daily Janine Limbo, MD   1,000 Units at 09/12/22 [id number]    Lab Results: No results found for this or any previous visit (from the past 48 hour(s)).  Blood Alcohol level:  Lab Results  Component Value Date   ETH <10 10/16/2021   ETH <10 0000000    Metabolic Labs: Lab Results  Component Value Date   HGBA1C 5.2 05/18/2022   MPG 102.54 05/18/2022   MPG 120 04/29/2021   Lab Results  Component Value Date   PROLACTIN 18.5 06/18/2022   PROLACTIN 58.3 (H) 06/06/2022   Lab Results  Component Value Date   CHOL 217 (H) 05/18/2022   TRIG 31 05/18/2022   HDL 71 05/18/2022   CHOLHDL 3.1 05/18/2022   VLDL 6 05/18/2022   LDLCALC 140 (H) 05/18/2022   LDLCALC 94 05/02/2021    Sleep:No data recorded  Physical Findings: AIMS: No  CIWA:    COWS:     Mental Status Exam:  Appearance and Grooming: Patient is casually dressed in t-shirt and joggers . The patient has no noticeable scent or odor.  Behavior: The patient appears in no acute distress, and during the interview, was calm, focused, required minimal redirection, and behaving appropriately to scenario; she was able to follow commands and compliant to requests and made minimal eye contact.  Attitude: Patient was cooperative and open during the interview.  Motor activity: The patient's movement speed was normal; her  gait was normal. There was no notable abnormal facial movements and no notable abnormal extremity movements.  Speech: The patient's speech was clear, fluent, with good articulation, and with appropriately placed inflections. The volume of her speech was normal and normal in quantity. The rate was normal with a normal rhythm. Responses were normal in latency. There were no abnormal patterns in speech.  Mood: "I'm okay"  Affect: Patient's affect is euthymic with broad range and even fluctuations; her affect is congruent with her stated mood.  Thought Content The patient experiences no hallucinations. The patient describes persecutory delusions, specifically her family being out to harm her; she denies thought insertion, denies thought withdrawal, denies thought interruption, and denies thought broadcasting.  Patient denies active suicidal intent and denies passive suicidal ideation; she denies homicidal intent.  Thought Process The patient's thought process is linear and goal-directed.  Insight The patient demonstrates fair insight, as evidenced by awareness of her mental health conditions.  Judgement The patient demonstrates  fair judgement, as evidenced by adhering to medication regimen and actively participating in group therapy.   Physical Exam Vitals and nursing note reviewed.  Constitutional:      Appearance: Normal appearance.  HENT:     Head: Normocephalic and atraumatic.  Pulmonary:     Effort: Pulmonary effort is normal.  Neurological:     General: No focal deficit present.     Mental Status: She is alert. Mental status is at baseline.    Review of Systems  Constitutional: Negative.   Respiratory: Negative.    Cardiovascular: Negative.   Gastrointestinal: Negative.   Genitourinary: Negative.     Blood pressure 109/79, pulse (!) 113, temperature 98.3 F (36.8 C), temperature source Oral, resp. rate 20, SpO2 99 %. There is no height or weight on file to calculate  BMI.  Assets  Assets: Armed forces logistics/support/administrative officer; Physical Health; Resilience   Physical Exam: Constitutional:      Appearance: the patient is not toxic-appearing.  Pulmonary:     Effort: Pulmonary effort is normal.  Neurological:     General: No focal deficit present.     Mental Status: the patient is alert and oriented to person, place, and time.   Review of Systems  Respiratory:  Negative for shortness of breath.   Cardiovascular:  Negative for chest pain.  Gastrointestinal:  Negative for abdominal pain, constipation, diarrhea, nausea and vomiting.  Neurological:  Negative for headaches.   Blood pressure 109/79, pulse (!) 113, temperature 98.3 F (36.8 C), temperature source Oral, resp. rate 20, SpO2 99 %. There is no height or weight on file to calculate BMI.  Treatment Plan Summary: Daily contact with patient to assess and evaluate symptoms and progress in treatment and Medication management  Diagnoses / Active Problems: Schizoaffective disorder ([place]) Principal Problem:   Schizoaffective disorder ([place]) Active Problems:   GAD (generalized anxiety disorder)   Vitamin D deficiency   PLAN: Safety and Monitoring:  -- Involuntary admission to inpatient psychiatric unit for safety, stabilization and treatment  -- Daily contact with patient to assess and evaluate symptoms and progress in treatment  -- Patient's case to be discussed in multi-disciplinary team meeting  -- Observation Level : q15 minute checks  -- Vital signs:  q12 hours  -- Precautions: suicide, elopement, and assault  2. Medications:   -- Continue Clozaril 200 qhs - for psychosis.  -- Continue atropine drops 1% 1 drop tid for sialorrhea.  -- Continue Ritalin 10 mg qam - for daytime sedation 2/2 clozapine and negative symptoms of schizophrenia. -- Continue Zoloft 100 mg daily for depression -- Continue Norvasc 10 mg daily for hypertension -- Continue atenolol 12.5 mg once daily - for tachycardia 2/2 clozapine   -- Continue Colace 100 mg 2 twice daily for constipation -- Continue Miralax daily for constipation -- Continue Ensure TID for nutritional support -- Continue Ferrous Sulfate 325 mg daily for iron replacement -- Continue Atarax 25 mg TID PRN for anxiety -- Continue Metformin 500 mg XL for weight gain prophylaxis   -- Previously discontinued 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  -- Previously d/c geodon, caplyta, thorazine, zyprexa, risperdal due to no improvement (also concern for cheeking during previous antipsychotic medication trials)  -- Previously DISCONTINUED klonopin for anxiety associated with paranoid thoughts.  -- Completed - Diflucan 150 mg po daily for yeast infection. -- Completed - Megace for AUB   The risks/benefits/side-effects/alternatives to the above medication were discussed in detail with the patient and time was given for questions. The patient  consents to medication trial. FDA black box warnings, if present, were discussed.  The patient is agreeable with the medication plan, as above. We will monitor the patient's response to pharmacologic treatment, and adjust medications as necessary.  3. Routine and other pertinent labs:             -- Metabolic profile:  BMI: There is no height or weight on file to calculate BMI.  Prolactin: Lab Results  Component Value Date   PROLACTIN 18.5 06/18/2022   PROLACTIN 58.3 (H) 06/06/2022    Lipid Panel: Lab Results  Component Value Date   CHOL 217 (H) 05/18/2022   TRIG 31 05/18/2022   HDL 71 05/18/2022   CHOLHDL 3.1 05/18/2022   VLDL 6 05/18/2022   LDLCALC 140 (H) 05/18/2022   LDLCALC 94 05/02/2021    HbgA1c: Hgb A1c MFr Bld (%)  Date Value  05/18/2022 5.2    TSH: TSH (uIU/mL)  Date Value  06/18/2022 0.964  02/26/2015 1.352    EKG monitoring: QTc: 450 (8/28)  4. Group Therapy:  -- Encouraged patient to participate in unit milieu and in scheduled group therapies   -- Short Term Goals: Ability to identify changes in  lifestyle to reduce recurrence of condition will improve, Ability to verbalize feelings will improve, Ability to demonstrate self-control will improve, Ability to identify and develop effective coping behaviors will improve, Ability to maintain clinical measurements within normal limits will improve, and Ability to identify triggers associated with substance abuse/mental health issues will improve  -- Long Term Goals: Improvement in symptoms so as ready for discharge -- Patient is encouraged to participate in group therapy while admitted to the psychiatric unit. -- We will address other chronic and acute stressors, which contributed to the patient's Schizoaffective disorder ([place]) in order to reduce the risk of self-harm at discharge.  5. Discharge Planning:   -- Social work and case management to assist with discharge planning and identification of hospital follow-up needs prior to discharge  -- Estimated LOS: dc not before 09-29-22 per court  -- Discharge Concerns: Need to establish a safety plan; Medication compliance and effectiveness  -- Discharge Goals: Return home with outpatient referrals for mental health follow-up including medication management/psychotherapy    Total Time Spent in Direct Patient Care:  I personally spent 60 minutes on the unit in direct patient care. The direct patient care time included face-to-face time with the patient, reviewing the patient's chart, communicating with other professionals, and coordinating care. Greater than 50% of this time was spent in counseling or coordinating care with the patient regarding goals of hospitalization, psycho-education, and discharge planning needs.   I certify that inpatient services furnished can reasonably be expected to improve the patient's condition.    I discussed my assessment, planned testing and intervention for the patient with Dr. Danella Sensing who agrees with my formulated course of action.  Camelia Phenes, MD, PGY-1 09/13/2022,  8:01 AM

## 2022-09-13 NOTE — [facility] Group Notes (Signed)
PsychoEducational Group- Patients were given education on recognizing signs of mental health decompensation. Patients were asked to reflect and recognize signs/symptoms they noticed prior to coming to the hospital, and healthy coping mechanisms to utilize to help mental well being.  Patient participated and was appropriate.  

## 2022-09-13 NOTE — [facility] Group Notes (Signed)
[place] Group Notes:  (Nursing/MHT/Case Management/Adjunct)  Date:  09/13/2022  Time:  9:38 AM  Type of Therapy:   Discussion  Participation Level:  Active  Participation Quality:  Appropriate  Affect:  Appropriate  Cognitive:  Appropriate  Insight:  Appropriate  Engagement in Group:  Engaged  Modes of Intervention:  Discussion, Orientation, and Socialization  Summary of Progress/Problems:  Cheryl Adams 09/13/2022, 9:38 AM

## 2022-09-13 NOTE — Progress Notes (Signed)
Adult Psychoeducational Group Note  Date:  09/13/2022 Time:  8:45 PM  Group Topic/Focus:  Wrap-Up Group:   The focus of this group is to help patients review their daily goal of treatment and discuss progress on daily workbooks.  Participation Level:  Minimal  Participation Quality:  Appropriate  Affect:  Appropriate  Cognitive:  Appropriate  Insight: Appropriate  Engagement in Group:  Engaged  Modes of Intervention:  Education and Exploration  Additional Comments:  Patient attended and participated in group tonight. She reports that while she has been here she learnt about problem solving, She like that she is resourceful  Cheryl Adams 09/13/2022, 8:45 PM

## 2022-09-13 NOTE — Group Note (Signed)
LCSW Group Therapy Note  09/13/2022      Type of Therapy and Topic:  Group Therapy: Gratitude  Participation Level:  None   Description of Group:   In this group, patients shared and discussed the importance of acknowledging the elements in their lives for which they are grateful and how this can positively impact their mood.  The group discussed how bringing the positive elements of their lives to the forefront of their minds can help with recovery from any illness, physical or mental.  An exercise was done as a group in which a list was made of gratitude items in order to encourage participants to consider other potential positives in their lives.  Therapeutic Goals: Patients will identify one or more item for which they are grateful in each of 6 categories:  people, experience, thing, place, skill, and other. Patients will discuss how it is possible to seek out gratitude in even bad situations. Patients will explore other possible items of gratitude that they could remember.   Summary of Patient Progress:  The patient was in the room for the entirety of the group, but she appeared to be asleep, could not be aroused when asked to participate, was just left to sleep.   Therapeutic Modalities:   Solution-Focused Therapy Activity  Berlin Hun Grossman-Orr, LCSW .

## 2022-09-13 NOTE — Progress Notes (Signed)
   09/13/22 1100  Psych Admission Type (Psych Patients Only)  Admission Status Voluntary  Psychosocial Assessment  Patient Complaints None  Eye Contact Fair  Facial Expression Fixed smile  Affect Appropriate to circumstance  Speech Soft  Interaction Childlike  Motor Activity Other (Comment) (steady gait)  Appearance/Hygiene Unremarkable  Behavior Characteristics Cooperative  Mood Anxious;Pleasant  Thought Process  Coherency WDL  Content WDL  Delusions None reported or observed  Perception WDL  Hallucination None reported or observed  Judgment Impaired  Confusion None  Danger to Self  Current suicidal ideation? Denies  Self-Injurious Behavior No self-injurious ideation or behavior indicators observed or expressed   Agreement Not to Harm Self Yes  Description of Agreement verbal contract for safety  Danger to Others  Danger to Others None reported or observed

## 2022-09-14 NOTE — [facility] Group Notes (Signed)
Pt attended AA meeting.  

## 2022-09-14 NOTE — Progress Notes (Signed)
   09/14/22 0938  Psych Admission Type (Psych Patients Only)  Admission Status Voluntary  Psychosocial Assessment  Patient Complaints None  Eye Contact Fair  Facial Expression Fixed smile  Affect Appropriate to circumstance  Speech Soft  Interaction Childlike  Motor Activity Slow  Appearance/Hygiene Unremarkable  Behavior Characteristics Cooperative  Mood Pleasant  Thought Process  Coherency WDL  Content WDL  Delusions None reported or observed  Perception WDL  Hallucination None reported or observed  Judgment Impaired  Confusion None  Danger to Self  Current suicidal ideation? Denies  Danger to Others  Danger to Others None reported or observed   Dar Note: Patient presents with a calm affect and mood.  Denies suicidal thoughts, auditory and visual hallucinations.  Attended group with minimal participation.  Routine safety checks maintained.  Patient is safe on and off the unit.

## 2022-09-14 NOTE — Group Note (Signed)
Occupational Therapy Group Note  Group Topic:Communication  Group Date: 09/14/2022 Start Time: 1400 End Time: 1430 Facilitators: Brantley Stage, OT   Group Description: Group encouraged increased engagement and participation through discussion focused on communication styles. Patients were educated on the different styles of communication including passive, aggressive, assertive, and passive-aggressive communication. Group members shared and reflected on which styles they most often find themselves communicating in and brainstormed strategies on how to transition and practice a more assertive approach. Further discussion explored how to use assertiveness skills and strategies to further advocate and ask questions as it relates to their treatment plan and mental health.   Therapeutic Goal(s): Identify practical strategies to improve communication skills  Identify how to use assertive communication skills to address individual needs and wants   Participation Level: Active   Participation Quality: Independent   Behavior: Appropriate   Speech/Thought Process: Coherent   Affect/Mood: Appropriate   Insight: Fair   Judgement: Fair   Individualization: pt was engaged in their participation of group discussion/activity. New skills identified  Modes of Intervention: Discussion and Education  Patient Response to Interventions:  Attentive   Plan: Continue to engage patient in OT groups 2 - 3x/week.  09/14/2022  Brantley Stage, OT Cornell Barman, OT

## 2022-09-14 NOTE — [facility] Group Notes (Signed)
Spiritual care group on grief and loss facilitated by chaplain Janne Napoleon, [facility]   Group Goal:   Support / Education around grief and loss   Members engage in facilitated group support and psycho-social education.   Group Description:   Following introductions and group rules, group members engaged in facilitated group dialog and support around topic of loss, with particular support around experiences of loss in their lives. Group Identified types of loss (relationships / self / things) and identified patterns, circumstances, and changes that precipitate losses. Reflected on thoughts / feelings around loss, normalized grief responses, and recognized variety in grief experience. Group noted Worden's four tasks of grief in discussion.   Group drew on Adlerian / Rogerian, narrative, MI,   Patient Progress: Berdina attended group, but did not participate.

## 2022-09-14 NOTE — Progress Notes (Signed)
[facility] MD Progress Note  09/14/2022 7:46 AM JAYSA KISE  MRN:  [id number]  Principal Problem: Schizoaffective disorder (HCC) Diagnosis: Principal Problem:   Schizoaffective disorder (HCC) Active Problems:   GAD (generalized anxiety disorder)   Vitamin D deficiency   Reason for Admission:  Jandi Swiger is a 31 year old African-American female with prior diagnoses of schizoaffective d/o depressive type and GAD who initially presented to the [facility] urgent care [facility]) accompanied by her mother with complaints of paranoia. Pt was initially admittedon 05-19-2022, for many months, and treated for depression and paranoia with multiple medication and ultimately had partial response to clozapine. DSS was granted legal gaurdianship. On 09-10-2022, pt was d/c for MDE evaluation per court. On 09-10-2022, the pt was  readmitted to the psych unit after the evaluation (admitted on 09/10/2022, total  LOS: 4 days )  Chart Review from last 24 hours:  The patient's chart was reviewed and nursing notes were reviewed. The patient's case was discussed in multidisciplinary team meeting.   - Patient received all scheduled medications - Patient did not receive any PRN medications  Information Obtained Today During Patient Interview: The patient was seen and evaluated on the unit. On assessment today the patient reports feeling "fine." Patient did not engage spontaneously in conversation, but did respond to directed questions, mainly yes / no questions and single response answers.  Patient endorses good sleep, good appetite  The patient experiences no hallucinations. The patient describes no delusional thoughts and that her family is out to harm her ; she denies thought insertion, denies thought withdrawal, denies thought interruption, and denies thought broadcasting.  Patient denies active suicidal intent and denies passive suicidal ideation; she denies homicidal intent.  Patient denies  any side-effects attributable to medications. Patient denies any somatic complaints  Past Psychiatric History:  Past Surgical History:  Procedure Laterality Date   TONSILLECTOMY AND ADENOIDECTOMY Bilateral 11/14/2014   Procedure: BILATERAL TONSILLECTOMY AND ADENOIDECTOMY;  Surgeon: Flo Shanks, MD;  Location: [facility];  Service: ENT;  Laterality: Bilateral;   TYMPANOSTOMY TUBE PLACEMENT     Past Medical History:  Past Medical History:  Diagnosis Date   Anemia    Chronic tonsillitis 10/2014   Cough 11/09/2014   Difficulty swallowing pills    No psychiatric disorder found after evaluation 04/14/2022   Family History: History reviewed. No pertinent family history. Family Psychiatric History: per HPI Social History: per HPI  Current Medications: Current Facility-Administered Medications  Medication Dose Route Frequency Provider Last Rate Last Admin   acetaminophen (TYLENOL) tablet 650 mg  650 mg Oral Q6H PRN Massengill, Nathan, MD       alum & mag hydroxide-simeth (MAALOX/MYLANTA) 200-200-20 MG/5ML suspension 30 mL  30 mL Oral Q4H PRN Massengill, Nathan, MD       amLODipine (NORVASC) tablet 10 mg  10 mg Oral Daily Massengill, Nathan, MD   10 mg at 09/13/22 0813   antiseptic oral rinse (BIOTENE) solution 15 mL  15 mL Mouth Rinse 5 X Daily PRN Massengill, Harrold Donath, MD       atenolol (TENORMIN) tablet 12.5 mg  12.5 mg Oral Daily Massengill, Nathan, MD   12.5 mg at 09/13/22 9767   atropine 1 % ophthalmic solution 1 drop  1 drop Sublingual TID Phineas Inches, MD   1 drop at 09/13/22 1818   cloZAPine (CLOZARIL) tablet 200 mg  200 mg Oral QHS Massengill, Harrold Donath, MD   200 mg at 09/13/22 2110   docusate sodium (COLACE) capsule 100  mg  100 mg Oral BID Massengill, Ovid Curd, MD   100 mg at 09/13/22 1817   ferrous sulfate tablet 325 mg  325 mg Oral Daily Massengill, Ovid Curd, MD   325 mg at 09/13/22 1610   magnesium hydroxide (MILK OF MAGNESIA) suspension 30 mL  30 mL Oral Daily PRN  Massengill, Ovid Curd, MD       metFORMIN (GLUCOPHAGE-XR) 24 hr tablet 500 mg  500 mg Oral Q breakfast Massengill, Ovid Curd, MD   500 mg at 09/13/22 9604   methylphenidate (RITALIN) tablet 10 mg  10 mg Oral Daily Massengill, Ovid Curd, MD   10 mg at 09/13/22 5409   polyethylene glycol (MIRALAX / GLYCOLAX) packet 17 g  17 g Oral Daily Massengill, Ovid Curd, MD   17 g at 09/13/22 0811   sertraline (ZOLOFT) tablet 100 mg  100 mg Oral Daily Massengill, Ovid Curd, MD   100 mg at 09/13/22 8119   vitamin D3 (CHOLECALCIFEROL) tablet 1,000 Units  1,000 Units Oral Daily Janine Limbo, MD   1,000 Units at 09/13/22 1478    Lab Results: No results found for this or any previous visit (from the past 48 hour(s)).  Blood Alcohol level:  Lab Results  Component Value Date   ETH <10 10/16/2021   ETH <10 29/56/2130    Metabolic Labs: Lab Results  Component Value Date   HGBA1C 5.2 05/18/2022   MPG 102.54 05/18/2022   MPG 120 04/29/2021   Lab Results  Component Value Date   PROLACTIN 18.5 06/18/2022   PROLACTIN 58.3 (H) 06/06/2022   Lab Results  Component Value Date   CHOL 217 (H) 05/18/2022   TRIG 31 05/18/2022   HDL 71 05/18/2022   CHOLHDL 3.1 05/18/2022   VLDL 6 05/18/2022   LDLCALC 140 (H) 05/18/2022   LDLCALC 94 05/02/2021    Sleep:No data recorded  Physical Findings: AIMS: No  CIWA:    COWS:     Mental Status Exam:  Appearance and Grooming: Patient is casually dressed in hospital gown over long sleeve shirt and joggers . The patient has no noticeable scent or odor.  Behavior: The patient appears in no acute distress, and during the interview, was calm, focused, required minimal redirection, and behaving appropriately to scenario; she was able to follow commands and compliant to requests and made minimal eye contact.  Attitude: Patient was cooperative during the interview.  Motor activity: The patient's movement speed was normal; her gait was normal. There was no notable abnormal  facial movements and no notable abnormal extremity movements.  Speech: The patient's speech was clear, fluent, with good articulation, and with appropriately placed inflections. The volume of her speech was normal and normal in quantity. The rate was normal with a normal rhythm. Responses were normal in latency. There were no abnormal patterns in speech.  Mood: "I feel fine"  Affect: Patient's affect is euthymic with broad range and even fluctuations; her affect is congruent with her stated mood.  Thought Content The patient experiences no hallucinations. The patient describes persecutory delusions, specifically that her family is out to harm her ; she denies thought insertion, denies thought withdrawal, denies thought interruption, and denies thought broadcasting.  Patient denies active suicidal intent and denies passive suicidal ideation; she denies homicidal intent.  Thought Process The patient's thought process is linear, is goal-directed, and is linear and goal-directed.  Insight The patient demonstrates fair insight, as evidenced by understanding of mental health condition/s and ability to identify adaptive and maladaptive coping strategies.  Judgement The  patient demonstrates fair judgement, as evidenced by unwillingness to voluntarily seek help / treatment, adhering to medication regimen, and minimally engaging with staff / other patients.   Physical Exam Vitals and nursing note reviewed.  Constitutional:      Appearance: Normal appearance.  HENT:     Head: Normocephalic and atraumatic.  Pulmonary:     Effort: Pulmonary effort is normal.  Neurological:     General: No focal deficit present.     Mental Status: She is alert. Mental status is at baseline.    Review of Systems  Constitutional: Negative.   Respiratory: Negative.    Cardiovascular: Negative.   Gastrointestinal: Negative.   Genitourinary: Negative.     Blood pressure 115/85, pulse (!) 105, temperature  98.3 F (36.8 C), temperature source Oral, resp. rate 20, SpO2 100 %. There is no height or weight on file to calculate BMI.  Assets  Assets: Manufacturing systems engineer; Physical Health; Resilience   Physical Exam: Constitutional:      Appearance: the patient is not toxic-appearing.  Pulmonary:     Effort: Pulmonary effort is normal.  Neurological:     General: No focal deficit present.     Mental Status: the patient is alert and oriented to person, place, and time.   Review of Systems  Respiratory:  Negative for shortness of breath.   Cardiovascular:  Negative for chest pain.  Gastrointestinal:  Negative for abdominal pain, constipation, diarrhea, nausea and vomiting.  Neurological:  Negative for headaches.   Blood pressure 115/85, pulse (!) 105, temperature 98.3 F (36.8 C), temperature source Oral, resp. rate 20, SpO2 100 %. There is no height or weight on file to calculate BMI.  Treatment Plan Summary: Daily contact with patient to assess and evaluate symptoms and progress in treatment and Medication management  Diagnoses / Active Problems: Schizoaffective disorder (HCC) Principal Problem:   Schizoaffective disorder (HCC) Active Problems:   GAD (generalized anxiety disorder)   Vitamin D deficiency   PLAN: Safety and Monitoring:  -- Involuntary admission to inpatient psychiatric unit for safety, stabilization and treatment  -- Daily contact with patient to assess and evaluate symptoms and progress in treatment  -- Patient's case to be discussed in multi-disciplinary team meeting  -- Observation Level : q15 minute checks  -- Vital signs:  q12 hours  -- Precautions: suicide, elopement, and assault  2. Medications:              -- Continue Clozaril 200 qhs - for psychosis.  -- Continue atropine drops 1% 1 drop tid for sialorrhea.  -- Continue Ritalin 10 mg qam - for daytime sedation 2/2 clozapine and negative symptoms of schizophrenia. -- Continue Zoloft 100 mg daily for  depression -- Continue Norvasc 10 mg daily for hypertension -- Continue atenolol 12.5 mg once daily - for tachycardia 2/2 clozapine  -- Continue Colace 100 mg 2 twice daily for constipation -- Continue Miralax daily for constipation -- Continue Ensure TID for nutritional support -- Continue Ferrous Sulfate 325 mg daily for iron replacement -- Continue Atarax 25 mg TID PRN for anxiety -- Continue Metformin 500 mg XL for weight gain prophylaxis               -- Previously discontinued 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  -- Previously d/c geodon, caplyta, thorazine, zyprexa, risperdal due to no improvement (also concern for cheeking during previous antipsychotic medication trials)  -- Previously DISCONTINUED klonopin for anxiety associated with paranoid thoughts.  -- Completed - Diflucan 150 mg  po daily for yeast infection. -- Completed - Megace for AUB   The risks/benefits/side-effects/alternatives to the above medication were discussed in detail with the patient and time was given for questions. The patient consents to medication trial. FDA black box warnings, if present, were discussed.  The patient is agreeable with the medication plan, as above. We will monitor the patient's response to pharmacologic treatment, and adjust medications as necessary.  3. Routine and other pertinent labs:             -- Metabolic profile:  BMI: There is no height or weight on file to calculate BMI.  Prolactin: Lab Results  Component Value Date   PROLACTIN 18.5 06/18/2022   PROLACTIN 58.3 (H) 06/06/2022    Lipid Panel: Lab Results  Component Value Date   CHOL 217 (H) 05/18/2022   TRIG 31 05/18/2022   HDL 71 05/18/2022   CHOLHDL 3.1 05/18/2022   VLDL 6 05/18/2022   LDLCALC 140 (H) 05/18/2022   LDLCALC 94 05/02/2021    HbgA1c: Hgb A1c MFr Bld (%)  Date Value  05/18/2022 5.2    TSH: TSH (uIU/mL)  Date Value  06/18/2022 0.964  02/26/2015 1.352    EKG monitoring: QTc: 450 (8/28)  4. Group  Therapy:             -- Encouraged patient to participate in unit milieu and in scheduled group therapies              -- Short Term Goals: Ability to identify changes in lifestyle to reduce recurrence of condition will improve, Ability to verbalize feelings will improve, Ability to demonstrate self-control will improve, Ability to identify and develop effective coping behaviors will improve, Ability to maintain clinical measurements within normal limits will improve, and Ability to identify triggers associated with substance abuse/mental health issues will improve             -- Long Term Goals: Improvement in symptoms so as ready for discharge -- Patient is encouraged to participate in group therapy while admitted to the psychiatric unit. -- We will address other chronic and acute stressors, which contributed to the patient's Schizoaffective disorder (HCC) in order to reduce the risk of self-harm at discharge.  5. Discharge Planning:   -- Social work and case management to assist with discharge planning and identification of hospital follow-up needs prior to discharge            -- Estimated LOS: dc not before 09-29-22 per court  -- Discharge Concerns: Need to establish a safety plan; Medication compliance and effectiveness  -- Discharge Goals: Return home with outpatient referrals for mental health follow-up including medication management/psychotherapy    Total Time Spent in Direct Patient Care:  I personally spent 45 minutes on the unit in direct patient care. The direct patient care time included face-to-face time with the patient, reviewing the patient's chart, communicating with other professionals, and coordinating care. Greater than 50% of this time was spent in counseling or coordinating care with the patient regarding goals of hospitalization, psycho-education, and discharge planning needs.   I certify that inpatient services furnished can reasonably be expected to improve the patient's  condition.    I discussed my assessment, planned testing and intervention for the patient with Dr. Octavia Bruckner who agrees with my formulated course of action.  Augusto Gamble, MD, PGY-1 09/14/2022, 7:46 AM

## 2022-09-14 NOTE — Group Note (Signed)
LCSW Group Therapy Note  Group Date: 09/14/2022 Start Time: 1300 End Time: 1400   Type of Therapy and Topic:  Group Therapy - Healthy vs Unhealthy Coping Skills  Participation Level:  Active   Description of Group The focus of this group was to determine what unhealthy coping techniques typically are used by group members and what healthy coping techniques would be helpful in coping with various problems. Patients were guided in becoming aware of the differences between healthy and unhealthy coping techniques. Patients were asked to identify 2-3 healthy coping skills they would like to learn to use more effectively.  Therapeutic Goals Patients learned that coping is what human beings do all day long to deal with various situations in their lives Patients defined and discussed healthy vs unhealthy coping techniques Patients identified their preferred coping techniques and identified whether these were healthy or unhealthy Patients determined 2-3 healthy coping skills they would like to become more familiar with and use more often. Patients provided support and ideas to each other   Summary of Patient Progress: Patient proved open to input from peers and feedback from [place]. Patient demonstrated appropriate insight into the subject matter, was respectful of peers, and participated throughout the entire session.   Therapeutic Modalities Cognitive Behavioral Therapy Motivational Interviewing  Windle Guard, LCSW 09/14/2022  2:24 PM

## 2022-09-14 NOTE — Group Note (Signed)
Recreation Therapy Group Note   Group Topic:Problem Solving  Group Date: 09/14/2022 Start Time: 0930 End Time: 1012 Facilitators: Uva Runkel-McCall, LRT,CTRS Location: [address]   Goal Area(s) Addresses:  Patient will effectively work with peer towards shared goal.  Patient will identify skills used to make activity successful.  Patient will identify how skills used during activity can be used to reach post d/c goals.   Group Description: Straw Bridge. In teams of 3-5, patients were given 15 plastic drinking straws and an equal length of masking tape. Using the materials provided, patients were instructed to build a free standing bridge-like structure to suspend an everyday item (ex: puzzle box) off of the floor or table surface. All materials were required to be used by the team in their design. LRT facilitated post-activity discussion reviewing team process. Patients were encouraged to reflect how the skills used in this activity can be generalized to daily life post discharge.    Affect/Mood: Appropriate   Participation Level: Engaged   Participation Quality: Independent   Behavior: Appropriate   Speech/Thought Process: Focused   Insight: Good   Judgement: Good   Modes of Intervention: STEM Activity   Patient Response to Interventions:  Engaged   Education Outcome:  Acknowledges education and In group clarification offered    Clinical Observations/Individualized Feedback: Pt was appropriate and worked well with peers in completing activity.     Plan: Continue to engage patient in RT group sessions 2-3x/week.   Ellis Mehaffey-McCall, LRT,CTRS 09/14/2022 1:21 PM

## 2022-09-14 NOTE — Group Note (Unsigned)
LCSW Group Therapy Note  Group Date: 09/14/2022 Start Time: 1300 End Time: 1400   Type of Therapy and Topic:  Group Therapy - How To Cope with Nervousness about Discharge   Participation Level:  {[facility] PARTICIPATION LEVEL:22264}   Description of Group This process group involved identification of patients' feelings about discharge. Some of them are scheduled to be discharged soon, while others are new admissions, but each of them was asked to share thoughts and feelings surrounding discharge from the hospital. One common theme was that they are excited at the prospect of going home, while another was that many of them are apprehensive about sharing why they were hospitalized. Patients were given the opportunity to discuss these feelings with their peers in preparation for discharge.  Therapeutic Goals  1. Patient will identify their overall feelings about pending discharge. 2. Patient will think about how they might proactively address issues that they believe will once again arise once they get home (i.e. with parents). 3. Patients will participate in discussion about having hope for change.   Summary of Patient Progress:  *** was very active throughout the session. *** demonstrated *** insight into the subject matter, and proved open to input from peers and feedback from CSW. *** was respectful of peers and participated throughout the entire session.   Therapeutic Modalities Cognitive Behavioral Therapy   Karan Inclan W Brook Geraci, LCSWA 09/14/2022  1:06 PM   

## 2022-09-15 DIAGNOSIS — F251 Schizoaffective disorder, depressive type: Secondary | ICD-10-CM | POA: Diagnosis not present

## 2022-09-15 LAB — CBC WITH DIFFERENTIAL/PLATELET
Abs Immature Granulocytes: 0.04 10*3/uL (ref 0.00–0.07)
Basophils Absolute: 0 10*3/uL (ref 0.0–0.1)
Basophils Relative: 1 %
Eosinophils Absolute: 0.2 10*3/uL (ref 0.0–0.5)
Eosinophils Relative: 5 %
HCT: 39.6 % (ref 36.0–46.0)
Hemoglobin: 12.6 g/dL (ref 12.0–15.0)
Immature Granulocytes: 1 %
Lymphocytes Relative: 35 %
Lymphs Abs: 1.4 10*3/uL (ref 0.7–4.0)
MCH: 26.6 pg (ref 26.0–34.0)
MCHC: 31.8 g/dL (ref 30.0–36.0)
MCV: 83.5 fL (ref 80.0–100.0)
Monocytes Absolute: 0.3 10*3/uL (ref 0.1–1.0)
Monocytes Relative: 7 %
Neutro Abs: 2.1 10*3/uL (ref 1.7–7.7)
Neutrophils Relative %: 51 %
Platelets: 240 10*3/uL (ref 150–400)
RBC: 4.74 MIL/uL (ref 3.87–5.11)
RDW: 14.2 % (ref 11.5–15.5)
WBC: 4.1 10*3/uL (ref 4.0–10.5)
nRBC: 0 % (ref 0.0–0.2)

## 2022-09-15 NOTE — Progress Notes (Signed)
Pt presents with pleasant mood, affect blunted. Inayah forwards little but denies any acute concerns, depression and states she is '' fine. '' She is quiet, but has been visible attending programming and interacting appropriately with peers and staff on the unit. No acute physical concerns. Pt compliant with am medications.  Denies any SI or HI or A/ V Hallucinations. Chantella did complete her self inventory and rates her dperession, hopelessness and anxiety consistently on self inventory sheet at 4/10 on scale, 10 being worst, 0 being none on scale. She reports fair sleep and normal energy levels.  Pt did not indicate goal for today or any questions or things to help her meet her goal.  Per treatment team, pt placement awaiting.

## 2022-09-15 NOTE — Progress Notes (Signed)
TOC leadership team met with Adult Protective Services (APS) from [facility] regarding placement status.  Two facilities have requested the patient's FL-2 ([place] and [facility]) which were provided this morning.  There are 6 call backs pending from other agencies at this time.  The patient will require a TB skin test in preparation for any possible placement.  All information was communicated to [place] at [facility].  Eustaquio Boyden

## 2022-09-15 NOTE — BHH Group Notes (Signed)
Pt attended and contributed to group 

## 2022-09-15 NOTE — Progress Notes (Signed)
   09/15/22 0200  Psych Admission Type (Psych Patients Only)  Admission Status Voluntary  Psychosocial Assessment  Patient Complaints None  Eye Contact Fair  Facial Expression Fixed smile  Affect Appropriate to circumstance  Speech Soft  Interaction Childlike  Motor Activity Slow  Appearance/Hygiene Unremarkable  Behavior Characteristics Cooperative  Mood Pleasant  Thought Process  Coherency WDL  Content WDL  Delusions None reported or observed  Perception WDL  Hallucination None reported or observed  Judgment Impaired  Confusion None  Danger to Self  Current suicidal ideation? Denies  Danger to Others  Danger to Others None reported or observed

## 2022-09-15 NOTE — Group Note (Signed)
Recreation Therapy Group Note   Group Topic:Animal Assisted Therapy   Group Date: 09/15/2022 Start Time: 1430 End Time: 1512 Facilitators: Lesean Woolverton-McCall, LRT,CTRS Location: [address]   Animal-Assisted Activity (AAA) Program Checklist/Progress Notes Patient Eligibility Criteria Checklist & Daily Group note for Rec Tx Intervention  AAA/T Program Assumption of Risk Form signed by Patient/ or Parent Legal Guardian Yes  Patient understands his/her participation is voluntary Yes   Affect/Mood: N/A   Participation Level: Did not attend    Clinical Observations/Individualized Feedback:     Plan: Continue to engage patient in RT group sessions 2-3x/week.   Olympia Adelsberger-McCall, LRT,CTRS 09/15/2022 3:52 PM

## 2022-09-15 NOTE — Progress Notes (Signed)
[facility] MD Progress Note  09/15/2022 4:15 PM EDGAR CORRIGAN  MRN:  [id number]  Principal Problem: Schizoaffective disorder (HCC) Diagnosis: Principal Problem:   Schizoaffective disorder (HCC) Active Problems:   GAD (generalized anxiety disorder)   Vitamin D deficiency   Reason for Admission:  Cheryl Adams is a 31 year old African-American female with prior diagnoses of schizoaffective d/o depressive type and GAD who initially presented to the [facility] health urgent care [facility]) accompanied by her mother with complaints of paranoia. Pt was initially admittedon 05-19-2022, for many months, and treated for depression and paranoia with multiple medication and ultimately had partial response to clozapine. DSS was granted legal gaurdianship. On 09-10-2022, pt was d/c for MDE evaluation per court. On 09-10-2022, the pt was  readmitted to the psych unit after the evaluation (admitted on 09/10/2022, total  LOS: 5 days )  Chart Review from last 24 hours:  The patient's chart was reviewed and nursing notes were reviewed. The patient's case was discussed in multidisciplinary team meeting.   - Patient received all scheduled medications - Patient did not receive any PRN medications  Information Obtained Today During Patient Interview: The patient was seen and evaluated on the unit. On assessment today the patient continues to report feeling "fine." Patient again did not engage spontaneously in conversation, but did respond to directed questions, mainly yes / no questions and single response answers. I asked the patient if there was something she would like to talk about and she said, "uhm, not really."  Patient endorses good sleep, good appetite  The patient experiences no hallucinations. The patient describes no delusional thoughts and that her family is out to harm her ; she denies thought insertion, denies thought withdrawal, denies thought interruption, and denies thought  broadcasting.  Patient denies active suicidal intent and denies passive suicidal ideation; she denies homicidal intent.  Patient denies any side-effects attributable to medications. Patient denies any somatic complaints  Past Psychiatric History:  Past Surgical History:  Procedure Laterality Date   TONSILLECTOMY AND ADENOIDECTOMY Bilateral 11/14/2014   Procedure: BILATERAL TONSILLECTOMY AND ADENOIDECTOMY;  Surgeon: Flo Shanks, MD;  Location: [facility];  Service: ENT;  Laterality: Bilateral;   TYMPANOSTOMY TUBE PLACEMENT     Past Medical History:  Past Medical History:  Diagnosis Date   Anemia    Chronic tonsillitis 10/2014   Cough 11/09/2014   Difficulty swallowing pills    No psychiatric disorder found after evaluation 04/14/2022   Family History: History reviewed. No pertinent family history. Family Psychiatric History: per HPI Social History: per HPI  Current Medications: Current Facility-Administered Medications  Medication Dose Route Frequency Provider Last Rate Last Admin   acetaminophen (TYLENOL) tablet 650 mg  650 mg Oral Q6H PRN Massengill, Nathan, MD       alum & mag hydroxide-simeth (MAALOX/MYLANTA) 200-200-20 MG/5ML suspension 30 mL  30 mL Oral Q4H PRN Massengill, Nathan, MD       amLODipine (NORVASC) tablet 10 mg  10 mg Oral Daily Massengill, Nathan, MD   10 mg at 09/15/22 2694   antiseptic oral rinse (BIOTENE) solution 15 mL  15 mL Mouth Rinse 5 X Daily PRN Massengill, Harrold Donath, MD       atenolol (TENORMIN) tablet 12.5 mg  12.5 mg Oral Daily Massengill, Nathan, MD   12.5 mg at 09/15/22 0833   atropine 1 % ophthalmic solution 1 drop  1 drop Sublingual TID Phineas Inches, MD   1 drop at 09/15/22 1140   cloZAPine (CLOZARIL) tablet 200  mg  200 mg Oral QHS Massengill, Harrold DonathNathan, MD   200 mg at 09/14/22 2124   docusate sodium (COLACE) capsule 100 mg  100 mg Oral BID Phineas InchesMassengill, Nathan, MD   100 mg at 09/15/22 [id number]   ferrous sulfate tablet 325 mg  325 mg  Oral Daily Massengill, Harrold DonathNathan, MD   325 mg at 09/15/22 [id number]   magnesium hydroxide (MILK OF MAGNESIA) suspension 30 mL  30 mL Oral Daily PRN Massengill, Harrold DonathNathan, MD       metFORMIN (GLUCOPHAGE-XR) 24 hr tablet 500 mg  500 mg Oral Q breakfast Massengill, Harrold DonathNathan, MD   500 mg at 09/15/22 [id number]   methylphenidate (RITALIN) tablet 10 mg  10 mg Oral Daily Massengill, Harrold DonathNathan, MD   10 mg at 09/15/22 [id number]   polyethylene glycol (MIRALAX / GLYCOLAX) packet 17 g  17 g Oral Daily Massengill, Harrold DonathNathan, MD   17 g at 09/15/22 0836   sertraline (ZOLOFT) tablet 100 mg  100 mg Oral Daily Massengill, Harrold DonathNathan, MD   100 mg at 09/15/22 [id number]   vitamin D3 (CHOLECALCIFEROL) tablet 1,000 Units  1,000 Units Oral Daily Massengill, Harrold DonathNathan, MD   1,000 Units at 09/15/22 [id number]    Lab Results:  Results for orders placed or performed during the hospital encounter of 09/10/22 (from the past 48 hour(s))  CBC with Differential/Platelet     Status: None   Collection Time: 09/15/22  7:09 AM  Result Value Ref Range   WBC 4.1 4.0 - 10.5 K/uL   RBC 4.74 3.87 - 5.11 MIL/uL   Hemoglobin 12.6 12.0 - 15.0 g/dL   HCT 21.339.6 08.636.0 - 57.846.0 %   MCV 83.5 80.0 - 100.0 fL   MCH 26.6 26.0 - 34.0 pg   MCHC 31.8 30.0 - 36.0 g/dL   RDW 46.914.2 62.911.5 - 52.815.5 %   Platelets 240 150 - 400 K/uL   nRBC 0.0 0.0 - 0.2 %   Neutrophils Relative % 51 %   Neutro Abs 2.1 1.7 - 7.7 K/uL   Lymphocytes Relative 35 %   Lymphs Abs 1.4 0.7 - 4.0 K/uL   Monocytes Relative 7 %   Monocytes Absolute 0.3 0.1 - 1.0 K/uL   Eosinophils Relative 5 %   Eosinophils Absolute 0.2 0.0 - 0.5 K/uL   Basophils Relative 1 %   Basophils Absolute 0.0 0.0 - 0.1 K/uL   Immature Granulocytes 1 %   Abs Immature Granulocytes 0.04 0.00 - 0.07 K/uL    Comment: Performed at [facility], [address] [place], [place] [id number]    Blood Alcohol level:  Lab Results  Component Value Date   [facility] 10/16/2021   ETH <10 04/24/2021    Metabolic Labs: Lab Results  Component  Value Date   HGBA1C 5.2 05/18/2022   MPG 102.54 05/18/2022   MPG 120 04/29/2021   Lab Results  Component Value Date   PROLACTIN 18.5 06/18/2022   PROLACTIN 58.3 (H) 06/06/2022   Lab Results  Component Value Date   CHOL 217 (H) 05/18/2022   TRIG 31 05/18/2022   HDL 71 05/18/2022   CHOLHDL 3.1 05/18/2022   VLDL 6 05/18/2022   LDLCALC 140 (H) 05/18/2022   LDLCALC 94 05/02/2021    Sleep:No data recorded  Physical Findings: AIMS: No  CIWA:    COWS:     Mental Status Exam:  Appearance and Grooming: Patient is casually dressed in hospital gown . The patient has no noticeable scent or odor.  Behavior: The patient appears  in no acute distress, and during the interview, was calm, focused, required minimal redirection, and behaving appropriately to scenario; she was able to follow commands and compliant to requests and made minimal eye contact.  Attitude: Patient was cooperative and open during the interview.  Motor activity: The patient's movement speed was normal; her gait was normal. There was no notable abnormal facial movements and no notable abnormal extremity movements.  Speech: The patient's speech was clear, fluent, with good articulation, and with appropriately placed inflections. The volume of her speech was normal and sparse in quantity. The rate was normal with a normal rhythm. Responses were normal in latency. There were no abnormal patterns in speech.  Mood: "I feel fine"  Affect: Patient's affect is constricted with restricted range and even fluctuations; her affect is congruent with her stated mood. -------------------------------------------------------------------------------------------------------------------------  Thought Content The patient experiences no hallucinations. The patient describes no delusional thoughts; she denies thought insertion, denies thought withdrawal, denies thought interruption, and denies thought broadcasting.  Patient denies  active suicidal intent and denies passive suicidal ideation; she denies homicidal intent.  Thought Process The patient's thought process is linear and is concrete, responding mostly to yes/no questions, not making spontaneous conversation, and giving short answers to questions .  Insight The patient demonstrates poor insight, as evidenced by lacking understanding of mental health condition/s, inability to identify trigger/s causing mental health decompensation, and inability to identify adaptive and maladaptive coping strategies.  Judgement The patient demonstrates fair judgement, as evidenced by adhering to medication regimen, actively participating in group therapy, and minimally engaging with staff / other patients.  Physical Exam Vitals and nursing note reviewed.  Constitutional:      Appearance: Normal appearance.  HENT:     Head: Normocephalic and atraumatic.  Pulmonary:     Effort: Pulmonary effort is normal.  Neurological:     General: No focal deficit present.     Mental Status: She is alert. Mental status is at baseline.    Review of Systems  Constitutional: Negative.   Respiratory: Negative.    Cardiovascular: Negative.   Genitourinary: Negative.    Targeted review of symptoms, specific for clozapine: Malaise/Sedation: denies Chest pain: denies Shortness of breath: denies Exertional capacity: denies Tachycardia: denies Cough: denies Sore Throat: denies Fever: denies Orthostatic hypotension (dizziness with standing): denies Hypersalivation: denies Constipation: denies Nausea: denies Nocturnal enuresis: denies  Symptoms of GERD -Acid reflux: denies -Burning sensation in chest, usually after eating: denies -Trouble swallowing: denies -Sensation of lump in throat: denies -Regurgitation (backwash) of food or sour liquid: denies   Blood pressure 120/81, pulse (!) 108, temperature 98.4 F (36.9 C), temperature source Oral, resp. rate 20, SpO2 100 %. There is  no height or weight on file to calculate BMI.  Assets  Assets: Armed forces logistics/support/administrative officer; Physical Health; Resilience   Physical Exam: Constitutional:      Appearance: the patient is not toxic-appearing.  Pulmonary:     Effort: Pulmonary effort is normal.  Neurological:     General: No focal deficit present.     Mental Status: the patient is alert and oriented to person, place, and time.   Review of Systems  Respiratory:  Negative for shortness of breath.   Cardiovascular:  Negative for chest pain.  Gastrointestinal:  Negative for abdominal pain, constipation, diarrhea, nausea and vomiting.  Neurological:  Negative for headaches.   Blood pressure 120/81, pulse (!) 108, temperature 98.4 F (36.9 C), temperature source Oral, resp. rate 20, SpO2 100 %. There is  no height or weight on file to calculate BMI.  Treatment Plan Summary: Daily contact with patient to assess and evaluate symptoms and progress in treatment and Medication management  Diagnoses / Active Problems: Schizoaffective disorder (HCC) Principal Problem:   Schizoaffective disorder (HCC) Active Problems:   GAD (generalized anxiety disorder)   Vitamin D deficiency   PLAN: Safety and Monitoring:  -- Voluntary admission to inpatient psychiatric unit for safety, stabilization and treatment  -- Daily contact with patient to assess and evaluate symptoms and progress in treatment  -- Patient's case to be discussed in multi-disciplinary team meeting  -- Observation Level : q15 minute checks  -- Vital signs:  q12 hours  -- Precautions: suicide, elopement, and assault  2. Medications:              -- Continue Clozaril 200 qhs - for psychosis, CBC w/ differential wnl (10/17) -- Continue atropine drops 1% 1 drop tid for sialorrhea.  -- Continue Ritalin 10 mg qam - for daytime sedation 2/2 clozapine and negative symptoms of schizophrenia. -- Continue Zoloft 100 mg daily for depression -- Continue Norvasc 10 mg daily for  hypertension -- Continue atenolol 12.5 mg once daily - for tachycardia 2/2 clozapine  -- Continue Colace 100 mg 2 twice daily for constipation -- Continue Miralax daily for constipation -- Continue Ensure TID for nutritional support -- Continue Ferrous Sulfate 325 mg daily for iron replacement -- Continue Atarax 25 mg TID PRN for anxiety -- Continue Metformin 500 mg XL for weight gain prophylaxis               -- Previously discontinued 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  -- Previously d/c geodon, caplyta, thorazine, zyprexa, risperdal due to no improvement (also concern for cheeking during previous antipsychotic medication trials)  -- Previously DISCONTINUED klonopin for anxiety associated with paranoid thoughts.  -- Completed - Diflucan 150 mg po daily for yeast infection. -- Completed - Megace for AUB   The risks/benefits/side-effects/alternatives to the above medication were discussed in detail with the patient and time was given for questions. The patient consents to medication trial. FDA black box warnings, if present, were discussed.  The patient is agreeable with the medication plan, as above. We will monitor the patient's response to pharmacologic treatment, and adjust medications as necessary.  3. Routine and other pertinent labs:             -- Metabolic profile:  BMI: There is no height or weight on file to calculate BMI.  CBC    Component Value Date/Time   WBC 4.1 09/15/2022 0709   RBC 4.74 09/15/2022 0709   HGB 12.6 09/15/2022 0709   HGB 13.6 02/07/2020 1549   HCT 39.6 09/15/2022 0709   HCT 42.7 02/07/2020 1549   PLT 240 09/15/2022 0709   PLT 273 02/07/2020 1549   MCV 83.5 09/15/2022 0709   MCV 81 02/07/2020 1549   MCH 26.6 09/15/2022 0709   MCHC 31.8 09/15/2022 0709   RDW 14.2 09/15/2022 0709   RDW 13.4 02/07/2020 1549   LYMPHSABS 1.4 09/15/2022 0709   LYMPHSABS 1.9 12/05/2019 1530   MONOABS 0.3 09/15/2022 0709   EOSABS 0.2 09/15/2022 0709   EOSABS 0.3 12/05/2019 1530    BASOSABS 0.0 09/15/2022 0709   BASOSABS 0.0 12/05/2019 1530   Prolactin: Lab Results  Component Value Date   PROLACTIN 18.5 06/18/2022   PROLACTIN 58.3 (H) 06/06/2022    Lipid Panel: Lab Results  Component Value Date   CHOL 217 (  H) 05/18/2022   TRIG 31 05/18/2022   HDL 71 05/18/2022   CHOLHDL 3.1 05/18/2022   VLDL 6 05/18/2022   LDLCALC 140 (H) 05/18/2022   LDLCALC 94 05/02/2021    HbgA1c: Hgb A1c MFr Bld (%)  Date Value  05/18/2022 5.2    TSH: TSH (uIU/mL)  Date Value  06/18/2022 0.964  02/26/2015 1.352    EKG monitoring: QTc: 450 (8/28)  4. Group Therapy:             -- Encouraged patient to participate in unit milieu and in scheduled group therapies              -- Short Term Goals: Ability to identify changes in lifestyle to reduce recurrence of condition will improve, Ability to verbalize feelings will improve, Ability to demonstrate self-control will improve, Ability to identify and develop effective coping behaviors will improve, Ability to maintain clinical measurements within normal limits will improve, and Ability to identify triggers associated with substance abuse/mental health issues will improve             -- Long Term Goals: Improvement in symptoms so as ready for discharge -- Patient is encouraged to participate in group therapy while admitted to the psychiatric unit. -- We will address other chronic and acute stressors, which contributed to the patient's Schizoaffective disorder (HCC) in order to reduce the risk of self-harm at discharge.  5. Discharge Planning:   -- Social work and case management to assist with discharge planning and identification of hospital follow-up needs prior to discharge            -- Estimated LOS: dc not before 09-29-22 per court  -- Discharge Concerns: Need to establish a safety plan; Medication compliance and effectiveness  -- Discharge Goals: Return home with outpatient referrals for mental health follow-up including  medication management/psychotherapy    Total Time Spent in Direct Patient Care:  I personally spent 45 minutes on the unit in direct patient care. The direct patient care time included face-to-face time with the patient, reviewing the patient's chart, communicating with other professionals, and coordinating care. Greater than 50% of this time was spent in counseling or coordinating care with the patient regarding goals of hospitalization, psycho-education, and discharge planning needs.   I certify that inpatient services furnished can reasonably be expected to improve the patient's condition.    I discussed my assessment, planned testing and intervention for the patient with Dr. Octavia Bruckner who agrees with my formulated course of action.  Augusto Gamble, MD, PGY-1 09/15/2022, 4:15 PM

## 2022-09-16 ENCOUNTER — Encounter (HOSPITAL_COMMUNITY): Payer: Self-pay

## 2022-09-16 DIAGNOSIS — F251 Schizoaffective disorder, depressive type: Secondary | ICD-10-CM | POA: Diagnosis not present

## 2022-09-16 NOTE — Group Note (Signed)
LCSW Group Therapy Note   Group Date: 09/16/2022 Start Time: 1300 End Time: 1400  Type of Therapy and Topic:  Group Therapy:  Strengths Exploration   Participation Level: Active  Description of Group: This group allows individuals to explore their strengths, learn to use strengths in new ways to improve well-being. Strengths-based interventions involve identifying strengths, understanding how they are used, and learning new ways to apply them. Individuals will identify their strengths, and then explore their roles in different areas of life (relationships, professional life, and personal fulfillment). Individuals will think about ways in which they currently use their strengths, along with new ways they could begin using them.    Therapeutic Goals Patient will verbalize two of their strengths Patient will identify how their strengths are currently used Patient will identify two new ways to apply their strengths  Patients will create a plan to apply their strengths in their daily lives     Summary of Patient Progress:  The Pt attended group and remained there the entire time.  The Pt accepted all worksheets and was engaged in the group discussion.  The Pt stated that their strengths are: intelligent, resourceful, creative.  The Pt was appropriate with peers throughout the session.       Therapeutic Modalities Cognitive Behavioral Therapy Motivational Interviewing  Darleen Crocker, [place] 09/16/2022  1:57 PM

## 2022-09-16 NOTE — Progress Notes (Signed)
Pt presents with pleasant mood, affect blunted again today. Cheryl Adams forwards little but denies any acute concerns, depression and states she is '' fine. '' She is quiet, but has been visible attending programming and interacting appropriately with peers and staff on the unit. She is approached this am early, sitting in alcove with peers. No acute physical concerns. Pt compliant with am medications.  Denies any SI or HI or A/ V Hallucinations. Cheryl Adams did not complete her self inventory today. Pt continues to be able to make her needs known, and is not in any acute distress.  Per treatment team, pt continues to be placement issue. Pt is safe, will con't to monitor.

## 2022-09-16 NOTE — [facility] Group Notes (Signed)
Adult Psychoeducational Group Note  Date:  09/16/2022 Time:  1:21 PM  Group Topic/Focus:  Goals Group:   The focus of this group is to help patients establish daily goals to achieve during treatment and discuss how the patient can incorporate goal setting into their daily lives to aide in recovery.  Participation Level:  Active  Participation Quality:  Appropriate  Affect:  Appropriate  Cognitive:  Appropriate  Insight: Appropriate  Engagement in Group:  Engaged  Modes of Intervention:  Education  Additional Comments:  Pt goal today is to meditate and go to groups.  Sanika Brosious, Georgiann Mccoy 09/16/2022, 1:21 PM

## 2022-09-16 NOTE — Group Note (Signed)
Recreation Therapy Group Note   Group Topic:Stress Management  Group Date: 09/16/2022 Start Time: 0930 End Time: 0950 Facilitators: Coolidge Gossard-McCall, LRT,CTRS Location: [address] Dayroom   Goal Area(s) Addresses:  Patient will identify positive stress management techniques. Patient will identify benefits of using stress management post d/c.  Group Description:  Meditation.  LRT played a meditation that focused on having a mindful morning.  Patients were to listen and follow along as meditation played to fully engage in the group session.  Patients were to relax and get as comfortable as possible to get the full experience of the meditation.   Affect/Mood: Sleep   Participation Level: None   Participation Quality: N/A   Behavior: Sleep   Speech/Thought Process: N/A   Insight: N/A   Judgement: N/A   Modes of Intervention: Meditation   Patient Response to Interventions:  Sleep   Education Outcome:  In group clarification offered    Clinical Observations/Individualized Feedback: Pt was sleep during group session.    Plan: Continue to engage patient in RT group sessions 2-3x/week.   Sharlot Sturkey-McCall, LRT,CTRS 09/16/2022 12:27 PM

## 2022-09-16 NOTE — BH IP Treatment Plan (Signed)
Interdisciplinary Treatment and Diagnostic Plan Update  09/16/2022 Time of Session: 0830 Cheryl Adams MRN: [id number]  Principal Diagnosis: Schizoaffective disorder [facility])  Secondary Diagnoses: Principal Problem:   Schizoaffective disorder (HCC) Active Problems:   GAD (generalized anxiety disorder)   Vitamin D deficiency   Current Medications:  Current Facility-Administered Medications  Medication Dose Route Frequency Provider Last Rate Last Admin   acetaminophen (TYLENOL) tablet 650 mg  650 mg Oral Q6H PRN Massengill, Harrold Donath, MD       alum & mag hydroxide-simeth (MAALOX/MYLANTA) 200-200-20 MG/5ML suspension 30 mL  30 mL Oral Q4H PRN Massengill, Nathan, MD       amLODipine (NORVASC) tablet 10 mg  10 mg Oral Daily Massengill, Nathan, MD   10 mg at 09/16/22 9758   antiseptic oral rinse (BIOTENE) solution 15 mL  15 mL Mouth Rinse 5 X Daily PRN Massengill, Harrold Donath, MD       atenolol (TENORMIN) tablet 12.5 mg  12.5 mg Oral Daily Massengill, Nathan, MD   12.5 mg at 09/16/22 0742   atropine 1 % ophthalmic solution 1 drop  1 drop Sublingual TID Phineas Inches, MD   1 drop at 09/16/22 1144   cloZAPine (CLOZARIL) tablet 200 mg  200 mg Oral QHS Massengill, Harrold Donath, MD   200 mg at 09/15/22 2111   docusate sodium (COLACE) capsule 100 mg  100 mg Oral BID Massengill, Harrold Donath, MD   100 mg at 09/16/22 8325   ferrous sulfate tablet 325 mg  325 mg Oral Daily Massengill, Harrold Donath, MD   325 mg at 09/16/22 4982   magnesium hydroxide (MILK OF MAGNESIA) suspension 30 mL  30 mL Oral Daily PRN Massengill, Harrold Donath, MD       metFORMIN (GLUCOPHAGE-XR) 24 hr tablet 500 mg  500 mg Oral Q breakfast Massengill, Nathan, MD   500 mg at 09/16/22 0743   methylphenidate (RITALIN) tablet 10 mg  10 mg Oral Daily Massengill, Harrold Donath, MD   10 mg at 09/16/22 0742   polyethylene glycol (MIRALAX / GLYCOLAX) packet 17 g  17 g Oral Daily Massengill, Harrold Donath, MD   17 g at 09/16/22 0743   sertraline (ZOLOFT) tablet 100 mg  100 mg Oral  Daily Massengill, Harrold Donath, MD   100 mg at 09/16/22 6415   vitamin D3 (CHOLECALCIFEROL) tablet 1,000 Units  1,000 Units Oral Daily Massengill, Harrold Donath, MD   1,000 Units at 09/16/22 8309   PTA Medications: Medications Prior to Admission  Medication Sig Dispense Refill Last Dose   amLODipine (NORVASC) 10 MG tablet Take 1 tablet (10 mg total) by mouth daily.      antiseptic oral rinse (BIOTENE) LIQD 15 mLs by Mouth Rinse route 5 (five) times daily as needed for dry mouth.      atenolol (TENORMIN) 25 MG tablet Take 0.5 tablets (12.5 mg total) by mouth daily.      atropine 1 % ophthalmic solution Place 1 drop under the tongue 3 (three) times daily. 2 mL 12    cloZAPine (CLOZARIL) 200 MG tablet Take 1 tablet (200 mg total) by mouth at bedtime.      docusate sodium (COLACE) 100 MG capsule Take 1 capsule (100 mg total) by mouth 2 (two) times daily. 10 capsule 0    ferrous sulfate 325 (65 FE) MG tablet Take 325 mg by mouth daily.      metFORMIN (GLUCOPHAGE-XR) 500 MG 24 hr tablet Take 1 tablet (500 mg total) by mouth daily with breakfast.      methylphenidate (RITALIN) 10 MG  tablet Take 1 tablet (10 mg total) by mouth daily.  0    polyethylene glycol (MIRALAX / GLYCOLAX) 17 g packet Take 17 g by mouth daily. 14 each 0    sertraline (ZOLOFT) 100 MG tablet Take 1 tablet (100 mg total) by mouth daily.      vitamin D3 (CHOLECALCIFEROL) 25 MCG tablet Take 1 tablet (1,000 Units total) by mouth daily. 60 tablet      Patient Stressors:    Patient Strengths:    Treatment Modalities: Medication Management, Group therapy, Case management,  1 to 1 session with clinician, Psychoeducation, Recreational therapy.   Physician Treatment Plan for Primary Diagnosis: Schizoaffective disorder ([name]) Long Term Goal(s): Improvement in symptoms so as ready for discharge   Short Term Goals: Ability to identify changes in lifestyle to reduce recurrence of condition will improve Ability to verbalize feelings will  improve Ability to demonstrate self-control will improve Ability to identify and develop effective coping behaviors will improve Ability to maintain clinical measurements within normal limits will improve Ability to identify triggers associated with substance abuse/mental health issues will improve  Medication Management: Evaluate patient's response, side effects, and tolerance of medication regimen.  Therapeutic Interventions: 1 to 1 sessions, Unit Group sessions and Medication administration.  Evaluation of Outcomes: Adequate for Discharge  Physician Treatment Plan for Secondary Diagnosis: Principal Problem:   Schizoaffective disorder ([name]) Active Problems:   GAD (generalized anxiety disorder)   Vitamin D deficiency  Long Term Goal(s): Improvement in symptoms so as ready for discharge   Short Term Goals: Ability to identify changes in lifestyle to reduce recurrence of condition will improve Ability to verbalize feelings will improve Ability to demonstrate self-control will improve Ability to identify and develop effective coping behaviors will improve Ability to maintain clinical measurements within normal limits will improve Ability to identify triggers associated with substance abuse/mental health issues will improve     Medication Management: Evaluate patient's response, side effects, and tolerance of medication regimen.  Therapeutic Interventions: 1 to 1 sessions, Unit Group sessions and Medication administration.  Evaluation of Outcomes: Adequate for Discharge   RN Treatment Plan for Primary Diagnosis: Schizoaffective disorder ([name]) Long Term Goal(s): Knowledge of disease and therapeutic regimen to maintain health will improve  Short Term Goals: Ability to remain free from injury will improve, Ability to verbalize frustration and anger appropriately will improve, Ability to demonstrate self-control, Ability to participate in decision making will improve, Ability to  verbalize feelings will improve, Ability to disclose and discuss suicidal ideas, Ability to identify and develop effective coping behaviors will improve, and Compliance with prescribed medications will improve  Medication Management: RN will administer medications as ordered by provider, will assess and evaluate patient's response and provide education to patient for prescribed medication. RN will report any adverse and/or side effects to prescribing provider.  Therapeutic Interventions: 1 on 1 counseling sessions, Psychoeducation, Medication administration, Evaluate responses to treatment, Monitor vital signs and CBGs as ordered, Perform/monitor CIWA, COWS, AIMS and Fall Risk screenings as ordered, Perform wound care treatments as ordered.  Evaluation of Outcomes: Adequate for Discharge   LCSW Treatment Plan for Primary Diagnosis: Schizoaffective disorder [facility]) Long Term Goal(s): Safe transition to appropriate next level of care at discharge, Engage patient in therapeutic group addressing interpersonal concerns.  Short Term Goals: Engage patient in aftercare planning with referrals and resources, Increase social support, Increase ability to appropriately verbalize feelings, Increase emotional regulation, Facilitate acceptance of mental health diagnosis and concerns, Facilitate patient progression through stages of  change regarding substance use diagnoses and concerns, Identify triggers associated with mental health/substance abuse issues, and Increase skills for wellness and recovery  Therapeutic Interventions: Assess for all discharge needs, 1 to 1 time with Social worker, Explore available resources and support systems, Assess for adequacy in community support network, Educate family and significant other(s) on suicide prevention, Complete Psychosocial Assessment, Interpersonal group therapy.  Evaluation of Outcomes: Adequate for Discharge   Progress in Treatment: Attending groups:  Yes. Participating in groups: Yes. Taking medication as prescribed: Yes. Toleration medication: Yes. Family/Significant other contact made: Yes, individual(s) contacted:  Tobie Lords, mother  Patient understands diagnosis: Yes. Discussing patient identified problems/goals with staff: Yes. Medical problems stabilized or resolved: Yes. Denies suicidal/homicidal ideation: Yes. Issues/concerns per patient self-inventory: Yes. Other: none  New problem(s) identified: No, Describe:  none  New Short Term/Long Term Goal(s): Patient to work towards development of comprehensive mental wellness plan.  Patient Goals:  No additional goals identified at this time. Patient to continue to work towards original goals identified in initial treatment team meeting. CSW will remain available to patient should they voice additional treatment goals.   Discharge Plan or Barriers: Patient continues to board lacking adequate placement.   Reason for Continuation of Hospitalization: N/A patient is boarding   Estimated Length of Stay: TBD   Last [place] Suicide Severity Risk Score: [place] Admission (Discharged) from 05/19/2022 in [place] [facility] ED from 05/18/2022 in [facility] Office Visit from 04/14/2022 in [place] No Risk High Risk No Risk       Last PHQ 2/9 Scores:    04/14/2022   10:46 AM 02/24/2022   11:12 AM 01/13/2022    9:14 AM  Depression screen PHQ 2/9  Decreased Interest 0 0 0  Down, Depressed, Hopeless 0 0 0  PHQ - 2 Score 0 0 0    Scribe for Treatment Team: Larose Kells 09/16/2022 1:23 PM

## 2022-09-16 NOTE — [facility] Group Notes (Signed)
Patients were given education on signs and symptoms of behavioral dysregulation. Patients were then asked to identify using reflection signs they were not coping well, and identify healthy support systems.  Patient participated and attended group.  

## 2022-09-16 NOTE — [facility] Group Notes (Signed)
Patient attended NA meeting.  

## 2022-09-16 NOTE — Progress Notes (Signed)
[facility] MD Progress Note  09/16/2022 6:36 AM OTA RACER  MRN:  [id number]  Principal Problem: Schizoaffective disorder ([place]) Diagnosis: Principal Problem:   Schizoaffective disorder ([place]) Active Problems:   GAD (generalized anxiety disorder)   Vitamin D deficiency   Reason for Admission:  Cheryl Adams is a 31 year old African-American female with prior diagnoses of schizoaffective d/o depressive type and GAD who initially presented to the [facility] health urgent care [facility]) accompanied by her mother with complaints of paranoia. Pt was initially admittedon 05-19-2022, for many months, and treated for depression and paranoia with multiple medication and ultimately had partial response to clozapine. DSS was granted legal gaurdianship. On 09-10-2022, pt was d/c for MDE evaluation per court. On 09-10-2022, the pt was  readmitted to the psych unit after the evaluation (admitted on 09/10/2022, total  LOS: 6 days )  Chart Review from last 24 hours:  The patient's chart was reviewed and nursing notes were reviewed. The patient's case was discussed in multidisciplinary team meeting.   - Patient received all scheduled medications - Patient did not receive any PRN medications  Information Obtained Today During Patient Interview: The patient was seen and evaluated on the unit. On assessment today the patient is at baseline and continues to not actively engage in spontaneous conversation. She responds appropriately to yes/no questions and gives short answers.  Patient endorses good sleep, good appetite.  The patient experiences no hallucinations. The patient describes no delusional thoughts; she denies thought insertion, denies thought withdrawal, denies thought interruption, and denies thought broadcasting.  Patient denies active suicidal intent and denies passive suicidal ideation; she denies homicidal intent.  Patient denies any side-effects attributable to medications. Patient denies  any somatic complaints but notes that her sialorrhea is "improved."  Past Psychiatric History:  Past Surgical History:  Procedure Laterality Date   TONSILLECTOMY AND ADENOIDECTOMY Bilateral 11/14/2014   Procedure: BILATERAL TONSILLECTOMY AND ADENOIDECTOMY;  Surgeon: Jodi Marble, MD;  Location: [place];  Service: ENT;  Laterality: Bilateral;   TYMPANOSTOMY TUBE PLACEMENT     Past Medical History:  Past Medical History:  Diagnosis Date   Anemia    Chronic tonsillitis 10/2014   Cough 11/09/2014   Difficulty swallowing pills    No psychiatric disorder found after evaluation 04/14/2022   Family History: History reviewed. No pertinent family history. Family Psychiatric  History: Schizophrenia- Maternal grandfather.  Social History: Marital status: Single Are you sexually active?: No What is your sexual orientation?: Heterosexual Has your sexual activity been affected by drugs, alcohol, medication, or emotional stress?: No Does patient have children?: No   Current Medications: Current Facility-Administered Medications  Medication Dose Route Frequency Provider Last Rate Last Admin   acetaminophen (TYLENOL) tablet 650 mg  650 mg Oral Q6H PRN Massengill, Nathan, MD       alum & mag hydroxide-simeth (MAALOX/MYLANTA) 200-200-20 MG/5ML suspension 30 mL  30 mL Oral Q4H PRN Massengill, Nathan, MD       amLODipine (NORVASC) tablet 10 mg  10 mg Oral Daily Massengill, Nathan, MD   10 mg at 09/15/22 [id number]   antiseptic oral rinse (BIOTENE) solution 15 mL  15 mL Mouth Rinse 5 X Daily PRN Massengill, Ovid Curd, MD       atenolol (TENORMIN) tablet 12.5 mg  12.5 mg Oral Daily Massengill, Nathan, MD   12.5 mg at 09/15/22 0833   atropine 1 % ophthalmic solution 1 drop  1 drop Sublingual TID Janine Limbo, MD   1 drop at 09/15/22 1617  cloZAPine (CLOZARIL) tablet 200 mg  200 mg Oral QHS Massengill, Ovid Curd, MD   200 mg at 09/15/22 2111   docusate sodium (COLACE) capsule 100 mg  100 mg  Oral BID Janine Limbo, MD   100 mg at 09/15/22 1617   ferrous sulfate tablet 325 mg  325 mg Oral Daily Massengill, Ovid Curd, MD   325 mg at 09/15/22 [id number]   magnesium hydroxide (MILK OF MAGNESIA) suspension 30 mL  30 mL Oral Daily PRN Massengill, Ovid Curd, MD       metFORMIN (GLUCOPHAGE-XR) 24 hr tablet 500 mg  500 mg Oral Q breakfast Massengill, Ovid Curd, MD   500 mg at 09/15/22 [id number]   methylphenidate (RITALIN) tablet 10 mg  10 mg Oral Daily Massengill, Ovid Curd, MD   10 mg at 09/15/22 [id number]   polyethylene glycol (MIRALAX / GLYCOLAX) packet 17 g  17 g Oral Daily Massengill, Ovid Curd, MD   17 g at 09/15/22 0836   sertraline (ZOLOFT) tablet 100 mg  100 mg Oral Daily Massengill, Ovid Curd, MD   100 mg at 09/15/22 [id number]   vitamin D3 (CHOLECALCIFEROL) tablet 1,000 Units  1,000 Units Oral Daily Massengill, Ovid Curd, MD   1,000 Units at 09/15/22 [id number]    Lab Results:  Results for orders placed or performed during the hospital encounter of 09/10/22 (from the past 48 hour(s))  CBC with Differential/Platelet     Status: None   Collection Time: 09/15/22  7:09 AM  Result Value Ref Range   WBC 4.1 4.0 - 10.5 K/uL   RBC 4.74 3.87 - 5.11 MIL/uL   Hemoglobin 12.6 12.0 - 15.0 g/dL   HCT 39.6 36.0 - 46.0 %   MCV 83.5 80.0 - 100.0 fL   MCH 26.6 26.0 - 34.0 pg   MCHC 31.8 30.0 - 36.0 g/dL   RDW 14.2 11.5 - 15.5 %   Platelets 240 150 - 400 K/uL   nRBC 0.0 0.0 - 0.2 %   Neutrophils Relative % 51 %   Neutro Abs 2.1 1.7 - 7.7 K/uL   Lymphocytes Relative 35 %   Lymphs Abs 1.4 0.7 - 4.0 K/uL   Monocytes Relative 7 %   Monocytes Absolute 0.3 0.1 - 1.0 K/uL   Eosinophils Relative 5 %   Eosinophils Absolute 0.2 0.0 - 0.5 K/uL   Basophils Relative 1 %   Basophils Absolute 0.0 0.0 - 0.1 K/uL   Immature Granulocytes 1 %   Abs Immature Granulocytes 0.04 0.00 - 0.07 K/uL    Comment: Performed at [facility], [place] [address], [place], [place] [address]    Blood Alcohol level:  Lab Results  Component  Value Date   [facility] <10 10/16/2021   ETH <10 0000000    Metabolic Labs: Lab Results  Component Value Date   HGBA1C 5.2 05/18/2022   MPG 102.54 05/18/2022   MPG 120 04/29/2021   Lab Results  Component Value Date   PROLACTIN 18.5 06/18/2022   PROLACTIN 58.3 (H) 06/06/2022   Lab Results  Component Value Date   CHOL 217 (H) 05/18/2022   TRIG 31 05/18/2022   HDL 71 05/18/2022   CHOLHDL 3.1 05/18/2022   VLDL 6 05/18/2022   LDLCALC 140 (H) 05/18/2022   LDLCALC 94 05/02/2021    Sleep:No data recorded  Physical Findings: AIMS: No  CIWA:    COWS:     Mental Status Exam:  Appearance and Grooming: Patient is casually dressed in gown over long sleeve blouse and leggings . The patient has no  noticeable scent or odor.  Behavior: The patient appears in no acute distress, and during the interview, was calm, focused, required minimal redirection, and behaving appropriately to scenario; she was able to follow commands and compliant to requests and made good eye contact.  Attitude: Patient was cooperative during the interview.  Motor activity: The patient's movement speed was normal; her gait was normal. There was no notable abnormal facial movements and no notable abnormal extremity movements.  Patient does not appear to be responding to external stimuli.  Speech: The patient's speech was clear, fluent, with good articulation, and with appropriately placed inflections. The volume of her speech was soft and normal in quantity. The rate was normal with a normal rhythm. Responses were normal in latency. There were no abnormal patterns in speech.  Mood: "I feel fine, okay"  Affect: Patient's affect is euthymic and constricted with restricted range and even fluctuations; her affect is congruent with her stated mood. -------------------------------------------------------------------------------------------------------------------------  Thought Content The patient experiences  no hallucinations. The patient describes no delusional thoughts; she denies thought insertion, denies thought withdrawal, denies thought interruption, and denies thought broadcasting.  Patient denies active suicidal intent and denies passive suicidal ideation; she denies homicidal intent.  Thought Process The patient's thought process is linear and is concrete, not engaging in spontaneous conversation though responding to yes/no questions and short answers.  Insight The patient demonstrates poor insight, as evidenced by lacking understanding of mental health condition/s, inability to identify trigger/s causing mental health decompensation, and inability to identify adaptive and maladaptive coping strategies.  Judgement The patient demonstrates fair judgement, as evidenced by adhering to medication regimen and minimally engaging with staff / other patients.   Assets  Assets: Armed forces logistics/support/administrative officer; Physical Health; Resilience   Physical Exam: Constitutional:      Appearance: the patient is not toxic-appearing.  Pulmonary:     Effort: Pulmonary effort is normal.  Neurological:     General: No focal deficit present.     Mental Status: the patient is alert and oriented to person, place, and time.   Review of Systems  Respiratory:  Negative for shortness of breath.   Cardiovascular:  Negative for chest pain.  Gastrointestinal:  Negative for abdominal pain, constipation, diarrhea, nausea and vomiting.  Neurological:  Negative for headaches.   Blood pressure 118/76, pulse 99, temperature 98.4 F (36.9 C), temperature source Oral, resp. rate 20, SpO2 100 %. There is no height or weight on file to calculate BMI.  Treatment Plan Summary: Daily contact with patient to assess and evaluate symptoms and progress in treatment and Medication management  Diagnoses / Active Problems: Schizoaffective disorder ([place]) Principal Problem:   Schizoaffective disorder ([place]) Active Problems:   GAD  (generalized anxiety disorder)   Vitamin D deficiency   PLAN: Safety and Monitoring:  -- Voluntary admission to inpatient psychiatric unit for safety, stabilization and treatment  -- Daily contact with patient to assess and evaluate symptoms and progress in treatment  -- Patient's case to be discussed in multi-disciplinary team meeting  -- Observation Level : q15 minute checks  -- Vital signs:  q12 hours  -- Precautions: suicide, elopement, and assault  2. Medications:              -- Continue Clozaril 200 qhs - for psychosis, CBC w/ differential wnl (10/17)  Targeted review of symptoms, specific for clozapine: Malaise/Sedation: denies Chest pain: denies Shortness of breath: denies Exertional capacity: denies Tachycardia: denies Cough: denies Sore Throat: denies Fever: denies Orthostatic hypotension (  dizziness with standing): denies Hypersalivation: "improved" Constipation: denies Nausea: denies Nocturnal enuresis: denies  Symptoms of GERD -Acid reflux: denies -Burning sensation in chest, usually after eating: denies -Trouble swallowing: denies -Sensation of lump in throat: denies -Regurgitation (backwash) of food or sour liquid: denies  -- Continue atropine drops 1% 1 drop tid for sialorrhea.  -- Continue Ritalin 10 mg qam - for daytime sedation 2/2 clozapine and negative symptoms of schizophrenia. -- Continue Zoloft 100 mg daily for depression -- Continue Norvasc 10 mg daily for hypertension -- Continue atenolol 12.5 mg once daily - for tachycardia 2/2 clozapine  -- Continue Colace 100 mg 2 twice daily for constipation -- Continue Miralax daily for constipation -- Continue Ensure TID for nutritional support -- Continue Ferrous Sulfate 325 mg daily for iron replacement -- Continue Atarax 25 mg TID PRN for anxiety -- Continue Metformin 500 mg XL for weight gain prophylaxis               -- Previously discontinued 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  -- Previously d/c geodon,  caplyta, thorazine, zyprexa, risperdal due to no improvement (also concern for cheeking during previous antipsychotic medication trials)  -- Previously DISCONTINUED klonopin for anxiety associated with paranoid thoughts.  -- Completed - Diflucan 150 mg po daily for yeast infection. -- Completed - Megace for AUB   The risks/benefits/side-effects/alternatives to the above medication were discussed in detail with the patient and time was given for questions. The patient consents to medication trial. FDA black box warnings, if present, were discussed.  The patient is agreeable with the medication plan, as above. We will monitor the patient's response to pharmacologic treatment, and adjust medications as necessary.  3. Routine and other pertinent labs:             -- Metabolic profile:  BMI: There is no height or weight on file to calculate BMI.  CBC    Component Value Date/Time   WBC 4.1 09/15/2022 0709   RBC 4.74 09/15/2022 0709   HGB 12.6 09/15/2022 0709   HGB 13.6 02/07/2020 1549   HCT 39.6 09/15/2022 0709   HCT 42.7 02/07/2020 1549   PLT 240 09/15/2022 0709   PLT 273 02/07/2020 1549   MCV 83.5 09/15/2022 0709   MCV 81 02/07/2020 1549   MCH 26.6 09/15/2022 0709   MCHC 31.8 09/15/2022 0709   RDW 14.2 09/15/2022 0709   RDW 13.4 02/07/2020 1549   LYMPHSABS 1.4 09/15/2022 0709   LYMPHSABS 1.9 12/05/2019 1530   MONOABS 0.3 09/15/2022 0709   EOSABS 0.2 09/15/2022 0709   EOSABS 0.3 12/05/2019 1530   BASOSABS 0.0 09/15/2022 0709   BASOSABS 0.0 12/05/2019 1530   Prolactin: Lab Results  Component Value Date   PROLACTIN 18.5 06/18/2022   PROLACTIN 58.3 (H) 06/06/2022    Lipid Panel: Lab Results  Component Value Date   CHOL 217 (H) 05/18/2022   TRIG 31 05/18/2022   HDL 71 05/18/2022   CHOLHDL 3.1 05/18/2022   VLDL 6 05/18/2022   LDLCALC 140 (H) 05/18/2022   LDLCALC 94 05/02/2021    HbgA1c: Hgb A1c MFr Bld (%)  Date Value  05/18/2022 5.2    TSH: TSH (uIU/mL)  Date  Value  06/18/2022 0.964  02/26/2015 1.352    EKG monitoring: QTc: 450 (8/28)  Lab Results  Component Value Date   NEUTROABS 2.1 09/15/2022   4. Group Therapy:             -- Encouraged patient to participate in unit  milieu and in scheduled group therapies              -- Short Term Goals: Ability to identify changes in lifestyle to reduce recurrence of condition will improve, Ability to verbalize feelings will improve, Ability to demonstrate self-control will improve, Ability to identify and develop effective coping behaviors will improve, Ability to maintain clinical measurements within normal limits will improve, and Ability to identify triggers associated with substance abuse/mental health issues will improve             -- Long Term Goals: Improvement in symptoms so as ready for discharge -- Patient is encouraged to participate in group therapy while admitted to the psychiatric unit. -- We will address other chronic and acute stressors, which contributed to the patient's Schizoaffective disorder ([place]) in order to reduce the risk of self-harm at discharge.  5. Discharge Planning:   -- Social work and case management to assist with discharge planning and identification of hospital follow-up needs prior to discharge            -- Estimated LOS: dc not before 09-29-22 per court  -- Discharge Concerns: Need to establish a safety plan; Medication compliance and effectiveness  -- Discharge Goals: Return home with outpatient referrals for mental health follow-up including medication management/psychotherapy    Total Time Spent in Direct Patient Care:  I personally spent 45 minutes on the unit in direct patient care. The direct patient care time included face-to-face time with the patient, reviewing the patient's chart, communicating with other professionals, and coordinating care. Greater than 50% of this time was spent in counseling or coordinating care with the patient regarding goals of  hospitalization, psycho-education, and discharge planning needs.   I certify that inpatient services furnished can reasonably be expected to improve the patient's condition.    I discussed my assessment, planned testing and intervention for the patient with Dr. Mamie Levers who agrees with my formulated course of action.  Camelia Phenes, MD, PGY-1 09/16/2022, 6:36 AM

## 2022-09-17 DIAGNOSIS — F251 Schizoaffective disorder, depressive type: Secondary | ICD-10-CM | POA: Diagnosis not present

## 2022-09-17 NOTE — Progress Notes (Signed)
   09/17/22 0000  Psych Admission Type (Psych Patients Only)  Admission Status Voluntary  Psychosocial Assessment  Patient Complaints None  Eye Contact Avoids  Facial Expression Flat  Speech Soft  Interaction Childlike  Motor Activity Slow  Appearance/Hygiene Improved  Behavior Characteristics Cooperative  Mood Ambivalent  Thought Process  Coherency WDL  Content WDL  Delusions None reported or observed  Perception WDL  Hallucination None reported or observed  Judgment WDL  Confusion WDL  Danger to Self  Current suicidal ideation? Denies  Agreement Not to Harm Self Yes  Description of Agreement verbal  Danger to Others  Danger to Others None reported or observed

## 2022-09-17 NOTE — Progress Notes (Addendum)
[facility]  09/17/2022 7:02 AM [name] R Ruffini  MRN:  [id number]  Principal Problem: Schizoaffective disorder (HCC) Diagnosis: Principal Problem:   Schizoaffective disorder (HCC) Active Problems:   GAD (generalized anxiety disorder)   Vitamin D deficiency   Reason for Admission:  Cheryl Adams is a 31 year old African-American female with prior diagnoses of schizoaffective d/o depressive type and GAD who [place] [facility] accompanied by her mother with complaints of paranoia. Pt was initially admittedon 05-19-2022, for many months, and treated for depression and paranoia with multiple medication and ultimately had partial response to clozapine. DSS was granted legal gaurdianship. On 09-10-2022, pt was d/c for MDE evaluation per court. On 09-10-2022, the pt was  readmitted to the psych unit after the evaluation (admitted on 09/10/2022, total  LOS: 7 days )  Chart Review from last 24 hours:  The patient's chart was reviewed and nursing notes were reviewed. The patient's case was discussed in multidisciplinary team meeting.   - Patient received all scheduled medications - Patient did not receive any PRN medications  Information Obtained Today During Patient Interview: The patient was seen and evaluated on the unit. On assessment today the patient is at baseline and continues to not actively engage in spontaneous conversation. She responds appropriately to yes/no questions and gives short answers.  Patient endorses good sleep, good appetite.  The patient experiences no hallucinations. The patient does not spontaneously discuss delusional thoughts, but when prompted, expresses persecutory delusions about family members, stating "I don't feel safe around them" - likely fixed; she denies thought insertion, denies thought withdrawal, denies thought interruption, and denies thought broadcasting.  Patient denies active suicidal  intent and denies passive suicidal ideation; she denies homicidal intent.  Patient denies any side-effects attributable to medications. Patient denies any somatic complaints, but notes that today she feels "a little tired."  Also continues to note that her sialorrhea is "improved."  Past Psychiatric History:  Past Surgical History:  Procedure Laterality Date   TONSILLECTOMY AND ADENOIDECTOMY Bilateral 11/14/2014   Procedure: BILATERAL TONSILLECTOMY AND ADENOIDECTOMY;  Surgeon: Flo ShanksKarol Wolicki, MD;  Location: [facility];  Service: ENT;  Laterality: Bilateral;   TYMPANOSTOMY TUBE PLACEMENT     Past Medical History:  Past Medical History:  Diagnosis Date   Anemia    Chronic tonsillitis 10/2014   Cough 11/09/2014   Difficulty swallowing pills    No psychiatric disorder found after evaluation 04/14/2022   Family History: History reviewed. No pertinent family history. Family Psychiatric  History: Schizophrenia- Maternal grandfather.  Social History: Marital status: Single Are you sexually active?: No What is your sexual orientation?: Heterosexual Has your sexual activity been affected by drugs, alcohol, medication, or emotional stress?: No Does patient have children?: No   Current Medications: Current Facility-Administered Medications  Medication Dose Route Frequency Provider Last Rate Last Admin   acetaminophen (TYLENOL) tablet 650 mg  650 mg Oral Q6H PRN Massengill, Nathan, MD       alum & mag hydroxide-simeth (MAALOX/MYLANTA) 200-200-20 MG/5ML suspension 30 mL  30 mL Oral Q4H PRN Massengill, Nathan, MD       amLODipine (NORVASC) tablet 10 mg  10 mg Oral Daily Massengill, Nathan, MD   10 mg at 09/16/22 0742   antiseptic oral rinse (BIOTENE) solution 15 mL  15 mL Mouth Rinse 5 X Daily PRN Massengill, Harrold DonathNathan, MD       atenolol (TENORMIN) tablet 12.5 mg  12.5 mg Oral Daily Massengill,  Harrold Donath, MD   12.5 mg at 09/16/22 0742   atropine 1 % ophthalmic solution 1 drop  1 drop  Sublingual TID Phineas Inches, MD   1 drop at 09/16/22 1623   cloZAPine (CLOZARIL) tablet 200 mg  200 mg Oral QHS Massengill, Harrold Donath, MD   200 mg at 09/16/22 2133   docusate sodium (COLACE) capsule 100 mg  100 mg Oral BID Phineas Inches, MD   100 mg at 09/16/22 1623   ferrous sulfate tablet 325 mg  325 mg Oral Daily Massengill, Harrold Donath, MD   325 mg at 09/16/22 3810   magnesium hydroxide (MILK OF MAGNESIA) suspension 30 mL  30 mL Oral Daily PRN Massengill, Harrold Donath, MD       metFORMIN (GLUCOPHAGE-XR) 24 hr tablet 500 mg  500 mg Oral Q breakfast Massengill, Harrold Donath, MD   500 mg at 09/16/22 0743   methylphenidate (RITALIN) tablet 10 mg  10 mg Oral Daily Massengill, Harrold Donath, MD   10 mg at 09/16/22 0742   polyethylene glycol (MIRALAX / GLYCOLAX) packet 17 g  17 g Oral Daily Massengill, Harrold Donath, MD   17 g at 09/16/22 0743   sertraline (ZOLOFT) tablet 100 mg  100 mg Oral Daily Massengill, Harrold Donath, MD   100 mg at 09/16/22 1751   vitamin D3 (CHOLECALCIFEROL) tablet 1,000 Units  1,000 Units Oral Daily Massengill, Harrold Donath, MD   1,000 Units at 09/16/22 0258    Lab Results:  Results for orders placed or performed during the hospital encounter of 09/10/22 (from the past 48 hour(s))  CBC with Differential/Platelet     Status: None   Collection Time: 09/15/22  7:09 AM  Result Value Ref Range   WBC 4.1 4.0 - 10.5 K/uL   RBC 4.74 3.87 - 5.11 MIL/uL   Hemoglobin 12.6 12.0 - 15.0 g/dL   HCT 52.7 78.2 - 42.3 %   MCV 83.5 80.0 - 100.0 fL   MCH 26.6 26.0 - 34.0 pg   MCHC 31.8 30.0 - 36.0 g/dL   RDW 53.6 14.4 - 31.5 %   Platelets 240 150 - 400 K/uL   nRBC 0.0 0.0 - 0.2 %   Neutrophils Relative % 51 %   Neutro Abs 2.1 1.7 - 7.7 K/uL   Lymphocytes Relative 35 %   Lymphs Abs 1.4 0.7 - 4.0 K/uL   Monocytes Relative 7 %   Monocytes Absolute 0.3 0.1 - 1.0 K/uL   Eosinophils Relative 5 %   Eosinophils Absolute 0.2 0.0 - 0.5 K/uL   Basophils Relative 1 %   Basophils Absolute 0.0 0.0 - 0.1 K/uL   Immature  Granulocytes 1 %   Abs Immature Granulocytes 0.04 0.00 - 0.07 K/uL    Comment: Performed at [facility], [address], [place], [place] [address]    Blood Alcohol level:  Lab Results  Component Value Date   [facility] <10 10/16/2021   ETH <10 04/24/2021    Metabolic Labs: Lab Results  Component Value Date   HGBA1C 5.2 05/18/2022   MPG 102.54 05/18/2022   MPG 120 04/29/2021   Lab Results  Component Value Date   PROLACTIN 18.5 06/18/2022   PROLACTIN 58.3 (H) 06/06/2022   Lab Results  Component Value Date   CHOL 217 (H) 05/18/2022   TRIG 31 05/18/2022   HDL 71 05/18/2022   CHOLHDL 3.1 05/18/2022   VLDL 6 05/18/2022   LDLCALC 140 (H) 05/18/2022   LDLCALC 94 05/02/2021    Sleep:No data recorded  Physical Findings: AIMS:  No  CIWA:    COWS:     Mental Status Exam:  Appearance and Grooming: Patient is casually dressed in gown over long sleeve blouse and leggings . The patient has no noticeable scent or odor.  Behavior: The patient appears in no acute distress, and during the interview, was calm, focused, required minimal redirection, and behaving appropriately to scenario; she was able to follow commands and compliant to requests and made good eye contact.  Attitude: Patient was cooperative during the interview.  Motor activity: The patient's movement speed was normal; her gait was normal. There was no notable abnormal facial movements and no notable abnormal extremity movements.  Patient does not appear to be responding to external stimuli.  Speech: The patient's speech was clear, fluent, with good articulation, and with appropriately placed inflections. The volume of her speech was soft and normal in quantity. The rate was normal with a normal rhythm. Responses were normal in latency. There were no abnormal patterns in speech.  Mood: "A little tired"  Affect: Patient's affect is euthymic and constricted with restricted range and even  fluctuations; her affect is congruent with her stated mood. -------------------------------------------------------------------------------------------------------------------------  Thought Content The patient experiences no hallucinations. The patient does not spontaneously discuss delusional thoughts, but when prompted, expresses persecutory delusions about family members, stating "I don't feel safe around them" - likely fixed; she denies thought insertion, denies thought withdrawal, denies thought interruption, and denies thought broadcasting.  Patient denies active suicidal intent and denies passive suicidal ideation; she denies homicidal intent.  Thought Process The patient's thought process is linear and is concrete, not engaging in spontaneous conversation though responding to yes/no questions and short answers.  Insight The patient at the time of interview demonstrates poor insight, as evidenced by lacking understanding of mental health condition/s, inability to identify trigger/s causing mental health decompensation, and inability to identify adaptive and maladaptive coping strategies.  Judgement The patient over the past 24 hours demonstrates fair judgement, as evidenced by adhering to medication regimen and minimally engaging with staff / other patients.  Assets  Assets: Manufacturing systems engineer; Physical Health; Resilience   Physical Exam: Constitutional:      Appearance: the patient is not toxic-appearing.  Pulmonary:     Effort: Pulmonary effort is normal.  Neurological:     General: No focal deficit present.     Mental Status: the patient is alert and oriented to person, place, and time.   Review of Systems  Respiratory:  Negative for shortness of breath.   Cardiovascular:  Negative for chest pain.  Gastrointestinal:  Negative for abdominal pain, constipation, diarrhea, nausea and vomiting.  Neurological:  Negative for headaches.   Blood pressure 120/73, pulse 92,  temperature 98 F (36.7 C), temperature source Oral, resp. rate 20, SpO2 100 %. There is no height or weight on file to calculate BMI.  Treatment Plan Summary: Daily contact with patient to assess and evaluate symptoms and progress in treatment and Medication management  Diagnoses / Active Problems: Schizoaffective disorder (HCC) Principal Problem:   Schizoaffective disorder (HCC) Active Problems:   GAD (generalized anxiety disorder)   Vitamin D deficiency   PLAN: Safety and Monitoring:  -- Voluntary admission to inpatient psychiatric unit for safety, stabilization and treatment  -- Daily contact with patient to assess and evaluate symptoms and progress in treatment  -- Patient's case to be discussed in multi-disciplinary team meeting  -- Observation Level : q15 minute checks  -- Vital signs:  q12 hours  -- Precautions:  suicide, elopement, and assault  2. Medications:              -- Continue Clozaril 200 qhs - for psychosis, CBC w/ differential wnl (10/17)  Targeted review of symptoms, specific for clozapine: Malaise/Sedation: endorses, "a little tired" Chest pain: denies Shortness of breath: denies Exertional capacity: denies Tachycardia: denies Cough: denies Sore Throat: denies Fever: denies Orthostatic hypotension (dizziness with standing): denies Hypersalivation: "improved" Constipation: denies Nausea: denies Nocturnal enuresis: denies  Symptoms of GERD -Acid reflux: denies -Burning sensation in chest, usually after eating: denies -Trouble swallowing: denies -Sensation of lump in throat: denies -Regurgitation (backwash) of food or sour liquid: denies  -- Continue atropine drops 1% 1 drop tid for sialorrhea.  -- Continue Ritalin 10 mg qam - for daytime sedation 2/2 clozapine and negative symptoms of schizophrenia. -- Continue Zoloft 100 mg daily for depression -- Continue Norvasc 10 mg daily for hypertension -- Continue atenolol 12.5 mg once daily - for  tachycardia 2/2 clozapine  -- Continue Colace 100 mg 2 twice daily for constipation -- Continue Miralax daily for constipation -- Continue Ensure TID for nutritional support -- Continue Ferrous Sulfate 325 mg daily for iron replacement -- Continue Atarax 25 mg TID PRN for anxiety -- Continue Metformin 500 mg XL for weight gain prophylaxis               -- Previously discontinued 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  -- Previously discontinued geodon, caplyta, thorazine, zyprexa, risperdal due to no improvement (also concern for cheeking during previous antipsychotic medication trials)  -- Previously discontinued klonopin for anxiety associated with paranoid thoughts.  -- Completed - Diflucan 150 mg po daily for yeast infection. -- Completed - Megace for AUB   The risks/benefits/side-effects/alternatives to the above medication were discussed in detail with the patient and time was given for questions. The patient consents to medication trial. FDA black box warnings, if present, were discussed.  The patient is agreeable with the medication plan, as above. We will monitor the patient's response to pharmacologic treatment, and adjust medications as necessary.  3. Routine and other pertinent labs:             -- Metabolic profile:  BMI: There is no height or weight on file to calculate BMI.  CBC    Component Value Date/Time   WBC 4.1 09/15/2022 0709   RBC 4.74 09/15/2022 0709   HGB 12.6 09/15/2022 0709   HGB 13.6 02/07/2020 1549   HCT 39.6 09/15/2022 0709   HCT 42.7 02/07/2020 1549   PLT 240 09/15/2022 0709   PLT 273 02/07/2020 1549   MCV 83.5 09/15/2022 0709   MCV 81 02/07/2020 1549   MCH 26.6 09/15/2022 0709   MCHC 31.8 09/15/2022 0709   RDW 14.2 09/15/2022 0709   RDW 13.4 02/07/2020 1549   LYMPHSABS 1.4 09/15/2022 0709   LYMPHSABS 1.9 12/05/2019 1530   MONOABS 0.3 09/15/2022 0709   EOSABS 0.2 09/15/2022 0709   EOSABS 0.3 12/05/2019 1530   BASOSABS 0.0 09/15/2022 0709   BASOSABS 0.0  12/05/2019 1530   Prolactin: Lab Results  Component Value Date   PROLACTIN 18.5 06/18/2022   PROLACTIN 58.3 (H) 06/06/2022    Lipid Panel: Lab Results  Component Value Date   CHOL 217 (H) 05/18/2022   TRIG 31 05/18/2022   HDL 71 05/18/2022   CHOLHDL 3.1 05/18/2022   VLDL 6 05/18/2022   LDLCALC 140 (H) 05/18/2022   LDLCALC 94 05/02/2021    HbgA1c: Hgb A1c MFr  Bld (%)  Date Value  05/18/2022 5.2    TSH: TSH (uIU/mL)  Date Value  06/18/2022 0.964  02/26/2015 1.352    EKG monitoring: QTc: 450 (8/28)  Lab Results  Component Value Date   NEUTROABS 2.1 09/15/2022   4. Group Therapy:             -- Encouraged patient to participate in unit milieu and in scheduled group therapies              -- Short Term Goals: Ability to identify changes in lifestyle to reduce recurrence of condition will improve, Ability to verbalize feelings will improve, Ability to demonstrate self-control will improve, Ability to identify and develop effective coping behaviors will improve, Ability to maintain clinical measurements within normal limits will improve, and Ability to identify triggers associated with substance abuse/mental health issues will improve             -- Long Term Goals: Improvement in symptoms so as ready for discharge -- Patient is encouraged to participate in group therapy while admitted to the psychiatric unit. -- We will address other chronic and acute stressors, which contributed to the patient's Schizoaffective disorder (HCC) in order to reduce the risk of self-harm at discharge.  5. Discharge Planning:   -- Social work and case management to assist with discharge planning and identification of hospital follow-up needs prior to discharge            -- Estimated LOS: dc not before 09-29-22 per court  -- Discharge Concerns: Need to establish a safety plan; Medication compliance and effectiveness  -- Discharge Goals: Return home with outpatient referrals for mental health  follow-up including medication management/psychotherapy  I certify that inpatient services furnished can reasonably be expected to improve the patient's condition.    I discussed my assessment, planned testing and intervention for the patient with Dr. Octavia Bruckner who agrees with my formulated course of action.  Augusto Gamble, MD, PGY-1 09/17/2022, 7:02 AM

## 2022-09-17 NOTE — Progress Notes (Signed)
[place] Group Notes:  (Nursing/MHT/Case Management/Adjunct)  Date:  09/17/2022  Time:  2000  Type of Therapy:   wrap up group  Participation Level:  Active  Participation Quality:  Appropriate, Attentive, Sharing, and Supportive  Affect:  Flat  Cognitive:  Alert  Insight:  Limited  Engagement in Group:  Engaged  Modes of Intervention:  Clarification, Education, and Support  Summary of Progress/Problems: Positive thinking and positive change were discussed.   Shellia Cleverly 09/17/2022, 8:58 PM

## 2022-09-17 NOTE — Progress Notes (Signed)
   09/17/22 0000  Psych Admission Type (Psych Patients Only)  Admission Status Voluntary  Psychosocial Assessment  Patient Complaints None  Eye Contact Avoids  Facial Expression Flat  Speech Soft  Interaction Childlike  Motor Activity Slow  Appearance/Hygiene Improved  Behavior Characteristics Cooperative  Mood Ambivalent  Thought Process  Coherency WDL  Content WDL  Delusions None reported or observed  Perception WDL  Hallucination None reported or observed  Judgment WDL  Confusion WDL  Danger to Self  Current suicidal ideation? Denies  Agreement Not to Harm Self Yes  Description of Agreement verbal  Danger to Others  Danger to Others None reported or observed    

## 2022-09-17 NOTE — Progress Notes (Signed)
   09/17/22 0628  Sleep  Number of Hours 5.25

## 2022-09-18 DIAGNOSIS — F251 Schizoaffective disorder, depressive type: Secondary | ICD-10-CM | POA: Diagnosis not present

## 2022-09-18 NOTE — Group Note (Signed)
[facility] LCSW Group Therapy Note    Group Date: 09/18/2022 Start Time: 1300 End Time: 1400  Type of Therapy and Topic:  Group Therapy:  Overcoming Obstacles  Participation Level:  [facility] PARTICIPATION LEVEL: Minimal  Mood: Blunted   Description of Group:   In this group patients will be encouraged to explore what they see as obstacles to their own wellness and recovery. They will be guided to discuss their thoughts, feelings, and behaviors related to these obstacles. The group will process together ways to cope with barriers, with attention given to specific choices patients can make. Each patient will be challenged to identify changes they are motivated to make in order to overcome their obstacles. This group will be process-oriented, with patients participating in exploration of their own experiences as well as giving and receiving support and challenge from other group members.  Therapeutic Goals: 1. Patient will identify personal and current obstacles as they relate to admission. 2. Patient will identify barriers that currently interfere with their wellness or overcoming obstacles.  3. Patient will identify feelings, thought process and behaviors related to these barriers. 4. Patient will identify two changes they are willing to make to overcome these obstacles:    Summary of Patient Progress Patient was present for the entirety of group session. Patient participated in opening and closing remarks. However, patient did not contribute at all to the topic of discussion despite encouraged participation.    Therapeutic Modalities:   Cognitive Behavioral Therapy Solution Focused Therapy Motivational Interviewing Relapse Prevention Therapy   Durenda Hurt, [place]

## 2022-09-18 NOTE — Progress Notes (Signed)
Pt on unit, attended groups, quiet on unit and minimally social. Pt remains medication compliant. Chews medications. Denies si/hi/self harm thoughts as well as a/v hallucinations.

## 2022-09-18 NOTE — Progress Notes (Signed)
[facility] MD Progress Note  09/18/2022 7:17 AM Cheryl Adams  MRN:  [id number]  Principal Problem: Schizoaffective disorder ([place]) Diagnosis: Principal Problem:   Schizoaffective disorder ([place]) Active Problems:   GAD (generalized anxiety disorder)   Vitamin D deficiency   Reason for Admission:  Cheryl Adams is a 31 year old African-American female with prior diagnoses of schizoaffective d/o depressive type and GAD who initially presented to the [facility] health urgent care [facility]) accompanied by her mother with complaints of paranoia. Pt was initially admittedon 05-19-2022, for many months, and treated for depression and paranoia with multiple medication and ultimately had partial response to clozapine. DSS was granted legal gaurdianship. On 09-10-2022, pt was d/c for MDE evaluation per court. On 09-10-2022, the pt was  readmitted to the psych unit after the evaluation (admitted on 09/10/2022, total  LOS: 8 days )  Chart Review from last 24 hours:  The patient's chart was reviewed and nursing notes were reviewed. The patient's case was discussed in multidisciplinary team meeting.   - Patient received all scheduled medications - Patient did not receive any PRN medications  Information Obtained Today During Patient Interview: The patient was seen and evaluated on the unit. On assessment today the patient is at baseline and interview was largely unchanged from yesterday. She responds appropriately to yes/no questions and gives short answers.  Patient endorses good sleep, good appetite.  The patient experiences no hallucinations. The patient does not spontaneously discuss delusional thoughts, but when prompted, expresses persecutory delusions about family members, stating "it's not safe" - likely fixed; she denies thought insertion, denies thought withdrawal, denies thought interruption, and denies thought broadcasting.  Patient denies active suicidal intent and denies passive suicidal  ideation; she denies homicidal intent.  Patient denies any side-effects attributable to medications. Patient denies any somatic complaints, continues to note feeling "a little tired."  Also continues to note that her sialorrhea is "improved."  Past Psychiatric History:  Past Surgical History:  Procedure Laterality Date   TONSILLECTOMY AND ADENOIDECTOMY Bilateral 11/14/2014   Procedure: BILATERAL TONSILLECTOMY AND ADENOIDECTOMY;  Surgeon: Jodi Marble, MD;  Location: [place];  Service: ENT;  Laterality: Bilateral;   TYMPANOSTOMY TUBE PLACEMENT     Past Medical History:  Past Medical History:  Diagnosis Date   Anemia    Chronic tonsillitis 10/2014   Cough 11/09/2014   Difficulty swallowing pills    No psychiatric disorder found after evaluation 04/14/2022   Family History: History reviewed. No pertinent family history. Family Psychiatric  History: Schizophrenia- Maternal grandfather.  Social History: Marital status: Single Are you sexually active?: No What is your sexual orientation?: Heterosexual Has your sexual activity been affected by drugs, alcohol, medication, or emotional stress?: No Does patient have children?: No   Current Medications: Current Facility-Administered Medications  Medication Dose Route Frequency Provider Last Rate Last Admin   acetaminophen (TYLENOL) tablet 650 mg  650 mg Oral Q6H PRN Massengill, Nathan, MD       alum & mag hydroxide-simeth (MAALOX/MYLANTA) 200-200-20 MG/5ML suspension 30 mL  30 mL Oral Q4H PRN Massengill, Nathan, MD       amLODipine (NORVASC) tablet 10 mg  10 mg Oral Daily Massengill, Nathan, MD   10 mg at 09/17/22 [id number]   antiseptic oral rinse (BIOTENE) solution 15 mL  15 mL Mouth Rinse 5 X Daily PRN Massengill, Ovid Curd, MD       atenolol (TENORMIN) tablet 12.5 mg  12.5 mg Oral Daily Massengill, Nathan, MD   12.5 mg at  09/17/22 0811   atropine 1 % ophthalmic solution 1 drop  1 drop Sublingual TID Janine Limbo, MD   1  drop at 09/17/22 1723   cloZAPine (CLOZARIL) tablet 200 mg  200 mg Oral QHS Massengill, Ovid Curd, MD   200 mg at 09/17/22 2115   docusate sodium (COLACE) capsule 100 mg  100 mg Oral BID Massengill, Ovid Curd, MD   100 mg at 09/17/22 1723   ferrous sulfate tablet 325 mg  325 mg Oral Daily Massengill, Ovid Curd, MD   325 mg at 09/17/22 [id number]   magnesium hydroxide (MILK OF MAGNESIA) suspension 30 mL  30 mL Oral Daily PRN Massengill, Ovid Curd, MD       metFORMIN (GLUCOPHAGE-XR) 24 hr tablet 500 mg  500 mg Oral Q breakfast Massengill, Ovid Curd, MD   500 mg at 09/17/22 [id number]   methylphenidate (RITALIN) tablet 10 mg  10 mg Oral Daily Massengill, Ovid Curd, MD   10 mg at 09/17/22 [id number]   polyethylene glycol (MIRALAX / GLYCOLAX) packet 17 g  17 g Oral Daily Massengill, Ovid Curd, MD   17 g at 09/17/22 0810   sertraline (ZOLOFT) tablet 100 mg  100 mg Oral Daily Massengill, Ovid Curd, MD   100 mg at 09/17/22 0810   vitamin D3 (CHOLECALCIFEROL) tablet 1,000 Units  1,000 Units Oral Daily Janine Limbo, MD   1,000 Units at 09/17/22 [id number]   Lab Results:  No results found for this or any previous visit (from the past 25 hour(s)).  Blood Alcohol level:  Lab Results  Component Value Date   ETH <10 10/16/2021   ETH <10 0000000   Metabolic Labs: Lab Results  Component Value Date   HGBA1C 5.2 05/18/2022   MPG 102.54 05/18/2022   MPG 120 04/29/2021   Lab Results  Component Value Date   PROLACTIN 18.5 06/18/2022   PROLACTIN 58.3 (H) 06/06/2022   Lab Results  Component Value Date   CHOL 217 (H) 05/18/2022   TRIG 31 05/18/2022   HDL 71 05/18/2022   CHOLHDL 3.1 05/18/2022   VLDL 6 05/18/2022   LDLCALC 140 (H) 05/18/2022   LDLCALC 94 05/02/2021   Sleep:No data recorded  Physical Findings: AIMS: No  CIWA:    COWS:     Mental Status Exam:  Appearance and Grooming: Patient is casually dressed in gown over long sleeve blouse and leggings . The patient has no noticeable scent or odor.  Behavior: The patient  appears in no acute distress, and during the interview, was calm, focused, required minimal redirection, and behaving appropriately to scenario; she was able to follow commands and compliant to requests and made good eye contact.  Attitude: Patient was cooperative during the interview.  Motor activity: The patient's movement speed was normal; her gait was normal. There was no notable abnormal facial movements and no notable abnormal extremity movements.  Patient does not appear to be responding to external stimuli.  Speech: The patient's speech was clear, fluent, with good articulation, and with appropriately placed inflections. The volume of her speech was soft and normal in quantity. The rate was normal with a normal rhythm. Responses were normal in latency. There were no abnormal patterns in speech.  Mood: "I feel fair"  Affect: Patient's affect is euthymic and constricted with restricted range and even fluctuations; her affect is congruent with her stated mood. -------------------------------------------------------------------------------------------------------------------------  Thought Content The patient experiences no hallucinations. The patient does not spontaneously discuss delusional thoughts, but when prompted, expresses persecutory delusions about family members, stating "it's not safe" -  likely fixed; she denies thought insertion, denies thought withdrawal, denies thought interruption, and denies thought broadcasting.  Patient denies active suicidal intent and denies passive suicidal ideation; she denies homicidal intent.  Thought Process The patient's thought process is linear and is concrete, not engaging in spontaneous conversation though responding to yes/no questions and short answers.  Insight The patient at the time of interview demonstrates poor insight, as evidenced by lacking understanding of mental health condition/s, inability to identify trigger/s causing  mental health decompensation, and inability to identify adaptive and maladaptive coping strategies.  Judgement The patient over the past 24 hours demonstrates fair judgement, as evidenced by adhering to medication regimen and minimally engaging with staff / other patients.  Assets  Assets: Armed forces logistics/support/administrative officer; Physical Health; Resilience  Physical Exam: Constitutional:      Appearance: the patient is not toxic-appearing.  Pulmonary:     Effort: Pulmonary effort is normal.  Neurological:     General: No focal deficit present.     Mental Status: the patient is alert and oriented to person, place, and time.   Review of Systems  Respiratory:  Negative for shortness of breath.   Cardiovascular:  Negative for chest pain.  Gastrointestinal:  Negative for abdominal pain, constipation, diarrhea, nausea and vomiting.  Neurological:  Negative for headaches.   Blood pressure 122/84, pulse 74, temperature 98.5 F (36.9 C), temperature source Oral, resp. rate 20, SpO2 100 %. There is no height or weight on file to calculate BMI.  Treatment Plan Summary: Daily contact with patient to assess and evaluate symptoms and progress in treatment and Medication management  Diagnoses / Active Problems: Schizoaffective disorder ([place]) Principal Problem:   Schizoaffective disorder ([place]) Active Problems:   GAD (generalized anxiety disorder)   Vitamin D deficiency   PLAN: Safety and Monitoring:  -- Voluntary admission to inpatient psychiatric unit for safety, stabilization and treatment  -- Daily contact with patient to assess and evaluate symptoms and progress in treatment  -- Patient's case to be discussed in multi-disciplinary team meeting  -- Observation Level : q15 minute checks  -- Vital signs:  q12 hours  -- Precautions: suicide, elopement, and assault  2. Medications:              -- Continue Clozaril 200 qhs - for psychosis, CBC w/ differential wnl (10/17)  Targeted review of symptoms,  specific for clozapine: Malaise/Sedation: endorses, "a little tired" Chest pain: denies Shortness of breath: denies Exertional capacity: denies Tachycardia: denies Cough: denies Sore Throat: denies Fever: denies Orthostatic hypotension (dizziness with standing): denies Hypersalivation: "improved" Constipation: denies Nausea: denies Nocturnal enuresis: denies  Symptoms of GERD -Acid reflux: denies -Burning sensation in chest, usually after eating: denies -Trouble swallowing: denies -Sensation of lump in throat: denies -Regurgitation (backwash) of food or sour liquid: denies  -- Continue atropine drops 1% 1 drop tid for sialorrhea.  -- Continue Ritalin 10 mg qam - for daytime sedation 2/2 clozapine and negative symptoms of schizophrenia. -- Continue Zoloft 100 mg daily for depression -- Continue Norvasc 10 mg daily for hypertension -- Continue atenolol 12.5 mg once daily - for tachycardia 2/2 clozapine  -- Continue Colace 100 mg 2 twice daily for constipation -- Continue Miralax daily for constipation -- Continue Ensure TID for nutritional support -- Continue Ferrous Sulfate 325 mg daily for iron replacement -- Continue Atarax 25 mg TID PRN for anxiety -- Continue Metformin 500 mg XL for weight gain prophylaxis               --  Previously discontinued 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  -- Previously discontinued geodon, caplyta, thorazine, zyprexa, risperdal due to no improvement (also concern for cheeking during previous antipsychotic medication trials)  -- Previously discontinued klonopin for anxiety associated with paranoid thoughts.  -- Completed - Diflucan 150 mg po daily for yeast infection. -- Completed - Megace for AUB   The risks/benefits/side-effects/alternatives to the above medication were discussed in detail with the patient and time was given for questions. The patient consents to medication trial. FDA black box warnings, if present, were discussed.  The patient is  agreeable with the medication plan, as above. We will monitor the patient's response to pharmacologic treatment, and adjust medications as necessary.  3. Routine and other pertinent labs:             -- Metabolic profile:  BMI: There is no height or weight on file to calculate BMI.  CBC    Component Value Date/Time   WBC 4.1 09/15/2022 0709   RBC 4.74 09/15/2022 0709   HGB 12.6 09/15/2022 0709   HGB 13.6 02/07/2020 1549   HCT 39.6 09/15/2022 0709   HCT 42.7 02/07/2020 1549   PLT 240 09/15/2022 0709   PLT 273 02/07/2020 1549   MCV 83.5 09/15/2022 0709   MCV 81 02/07/2020 1549   MCH 26.6 09/15/2022 0709   MCHC 31.8 09/15/2022 0709   RDW 14.2 09/15/2022 0709   RDW 13.4 02/07/2020 1549   LYMPHSABS 1.4 09/15/2022 0709   LYMPHSABS 1.9 12/05/2019 1530   MONOABS 0.3 09/15/2022 0709   EOSABS 0.2 09/15/2022 0709   EOSABS 0.3 12/05/2019 1530   BASOSABS 0.0 09/15/2022 0709   BASOSABS 0.0 12/05/2019 1530   Prolactin: Lab Results  Component Value Date   PROLACTIN 18.5 06/18/2022   PROLACTIN 58.3 (H) 06/06/2022    Lipid Panel: Lab Results  Component Value Date   CHOL 217 (H) 05/18/2022   TRIG 31 05/18/2022   HDL 71 05/18/2022   CHOLHDL 3.1 05/18/2022   VLDL 6 05/18/2022   LDLCALC 140 (H) 05/18/2022   LDLCALC 94 05/02/2021    HbgA1c: Hgb A1c MFr Bld (%)  Date Value  05/18/2022 5.2    TSH: TSH (uIU/mL)  Date Value  06/18/2022 0.964  02/26/2015 1.352    EKG monitoring: QTc: 450 (8/28)  Lab Results  Component Value Date   NEUTROABS 2.1 09/15/2022   4. Group Therapy:             -- Encouraged patient to participate in unit milieu and in scheduled group therapies              -- Short Term Goals: Ability to identify changes in lifestyle to reduce recurrence of condition will improve, Ability to verbalize feelings will improve, Ability to demonstrate self-control will improve, Ability to identify and develop effective coping behaviors will improve, Ability to  maintain clinical measurements within normal limits will improve, and Ability to identify triggers associated with substance abuse/mental health issues will improve             -- Long Term Goals: Improvement in symptoms so as ready for discharge -- Patient is encouraged to participate in group therapy while admitted to the psychiatric unit. -- We will address other chronic and acute stressors, which contributed to the patient's Schizoaffective disorder ([place]) in order to reduce the risk of self-harm at discharge.  5. Discharge Planning:   -- Social work and case management to assist with discharge planning and identification of hospital follow-up needs  prior to discharge            -- Estimated LOS: dc not before 09-29-22 per court  -- Discharge Concerns: Need to establish a safety plan; Medication compliance and effectiveness  -- Discharge Goals: Return home with outpatient referrals for mental health follow-up including medication management/psychotherapy  I certify that inpatient services furnished can reasonably be expected to improve the patient's condition.    I discussed my assessment, planned testing and intervention for the patient with Dr. Mamie Levers who agrees with my formulated course of action.  Camelia Phenes, MD, PGY-1 09/18/2022, 7:17 AM

## 2022-09-18 NOTE — Progress Notes (Signed)

## 2022-09-18 NOTE — Progress Notes (Signed)
TOC Director met with DSS program manager Robert Lee to discuss the County's efforts to identify placement for Cheryl Adams. The County is seeking approval for paying privately for a facility in [place], [place]. ([facility]) There is a bed pending. DSS social worker will attempt to coordinate a patient interview today with TOC team social worker. 

## 2022-09-19 DIAGNOSIS — F251 Schizoaffective disorder, depressive type: Secondary | ICD-10-CM | POA: Diagnosis not present

## 2022-09-19 NOTE — Progress Notes (Signed)
[facility] MD Progress Note  09/19/2022 7:24 AM Cheryl Adams  MRN:  [id number]  Principal Problem: Schizoaffective disorder (HCC) Diagnosis: Principal Problem:   Schizoaffective disorder (HCC) Active Problems:   GAD (generalized anxiety disorder)   Vitamin D deficiency   Reason for Admission:  Cheryl Adams is a 31 year old African-American female with prior diagnoses of schizoaffective d/o depressive type and GAD who initially presented to the [facility] health urgent care [facility]) accompanied by her mother with complaints of paranoia. Pt was initially admittedon 05-19-2022, for many months, and treated for depression and paranoia with multiple medication and ultimately had partial response to clozapine. DSS was granted legal gaurdianship. On 09-10-2022, pt was d/c for MDE evaluation per court. On 09-10-2022, the pt was  readmitted to the psych unit after the evaluation (admitted on 09/10/2022, total  LOS: 9 days )  Chart Review from last 24 hours:  The patient's chart was reviewed and nursing notes were reviewed. The patient's case was discussed in multidisciplinary team meeting.   - Patient received all scheduled medications - Patient did not receive any PRN medications  Information Obtained Today During Patient Interview: The patient was seen and evaluated on the unit. On assessment today the patient is at baseline and interview was largely unchanged from yesterday or the prior days. She responds appropriately to yes/no questions and gives short answers.  Patient endorses good sleep, good appetite.  The patient experiences no hallucinations. The patient does not spontaneously discuss delusional thoughts, but when prompted, expresses persecutory delusions about family members, stating "it's not safe" - likely fixed; she denies thought insertion, denies thought withdrawal, denies thought interruption, and denies thought broadcasting.  Patient denies active suicidal intent and denies  passive suicidal ideation; she denies homicidal intent.  Patient denies any side-effects attributable to medications. Patient denies any somatic complaints, denies feeling tired.  Also continues to note that her sialorrhea is "improved."  Past Psychiatric History:  Past Surgical History:  Procedure Laterality Date   TONSILLECTOMY AND ADENOIDECTOMY Bilateral 11/14/2014   Procedure: BILATERAL TONSILLECTOMY AND ADENOIDECTOMY;  Surgeon: Flo Shanks, MD;  Location: [facility];  Service: ENT;  Laterality: Bilateral;   TYMPANOSTOMY TUBE PLACEMENT     Past Medical History:  Past Medical History:  Diagnosis Date   Anemia    Chronic tonsillitis 10/2014   Cough 11/09/2014   Difficulty swallowing pills    No psychiatric disorder found after evaluation 04/14/2022   Family History: History reviewed. No pertinent family history. Family Psychiatric  History: Schizophrenia- Maternal grandfather.  Social History: Marital status: Single Are you sexually active?: No What is your sexual orientation?: Heterosexual Has your sexual activity been affected by drugs, alcohol, medication, or emotional stress?: No Does patient have children?: No   Current Medications: Current Facility-Administered Medications  Medication Dose Route Frequency Provider Last Rate Last Admin   acetaminophen (TYLENOL) tablet 650 mg  650 mg Oral Q6H PRN Massengill, Nathan, MD       alum & mag hydroxide-simeth (MAALOX/MYLANTA) 200-200-20 MG/5ML suspension 30 mL  30 mL Oral Q4H PRN Massengill, Nathan, MD       amLODipine (NORVASC) tablet 10 mg  10 mg Oral Daily Massengill, Nathan, MD   10 mg at 09/18/22 0755   antiseptic oral rinse (BIOTENE) solution 15 mL  15 mL Mouth Rinse 5 X Daily PRN Massengill, Harrold Donath, MD       atenolol (TENORMIN) tablet 12.5 mg  12.5 mg Oral Daily Massengill, Nathan, MD   12.5 mg at  09/18/22 0755   atropine 1 % ophthalmic solution 1 drop  1 drop Sublingual TID Phineas Inches, MD   1 drop  at 09/18/22 1800   cloZAPine (CLOZARIL) tablet 200 mg  200 mg Oral QHS Massengill, Harrold Donath, MD   200 mg at 09/18/22 2118   docusate sodium (COLACE) capsule 100 mg  100 mg Oral BID Massengill, Harrold Donath, MD   100 mg at 09/18/22 1800   ferrous sulfate tablet 325 mg  325 mg Oral Daily Massengill, Harrold Donath, MD   325 mg at 09/18/22 0755   magnesium hydroxide (MILK OF MAGNESIA) suspension 30 mL  30 mL Oral Daily PRN Massengill, Harrold Donath, MD       metFORMIN (GLUCOPHAGE-XR) 24 hr tablet 500 mg  500 mg Oral Q breakfast Massengill, Harrold Donath, MD   500 mg at 09/18/22 0755   methylphenidate (RITALIN) tablet 10 mg  10 mg Oral Daily Massengill, Harrold Donath, MD   10 mg at 09/18/22 0755   polyethylene glycol (MIRALAX / GLYCOLAX) packet 17 g  17 g Oral Daily Massengill, Harrold Donath, MD   17 g at 09/18/22 0754   sertraline (ZOLOFT) tablet 100 mg  100 mg Oral Daily Massengill, Harrold Donath, MD   100 mg at 09/18/22 9622   vitamin D3 (CHOLECALCIFEROL) tablet 1,000 Units  1,000 Units Oral Daily Phineas Inches, MD   1,000 Units at 09/18/22 0755   Lab Results:  No results found for this or any previous visit (from the past 48 hour(s)).  Blood Alcohol level:  Lab Results  Component Value Date   ETH <10 10/16/2021   ETH <10 04/24/2021   Metabolic Labs: Lab Results  Component Value Date   HGBA1C 5.2 05/18/2022   MPG 102.54 05/18/2022   MPG 120 04/29/2021   Lab Results  Component Value Date   PROLACTIN 18.5 06/18/2022   PROLACTIN 58.3 (H) 06/06/2022   Lab Results  Component Value Date   CHOL 217 (H) 05/18/2022   TRIG 31 05/18/2022   HDL 71 05/18/2022   CHOLHDL 3.1 05/18/2022   VLDL 6 05/18/2022   LDLCALC 140 (H) 05/18/2022   LDLCALC 94 05/02/2021   Sleep:No data recorded  Physical Findings: AIMS: No  CIWA:    COWS:     Mental Status Exam:  Appearance and Grooming: Patient is casually dressed in gown over long sleeve blouse and leggings . The patient has no noticeable scent or odor.  Behavior: The patient  appears in no acute distress, and during the interview, was calm, focused, required minimal redirection, and behaving appropriately to scenario; she was able to follow commands and compliant to requests and made good eye contact.  Attitude: Patient was cooperative during the interview.  Motor activity: The patient's movement speed was normal; her gait was normal. There was no notable abnormal facial movements and no notable abnormal extremity movements.  Patient does not appear to be responding to external stimuli.  Speech: The patient's speech was clear, fluent, with good articulation, and with appropriately placed inflections. The volume of her speech was soft and normal in quantity. The rate was normal with a normal rhythm. Responses were normal in latency. There were no abnormal patterns in speech.  Mood: "I feel fair"  Affect: Patient's affect is euthymic and constricted with restricted range and even fluctuations; her affect is congruent with her stated mood. -------------------------------------------------------------------------------------------------------------------------  Thought Content The patient experiences no hallucinations. The patient does not spontaneously discuss delusional thoughts, but when prompted, expresses persecutory delusions about family members, stating "it's not safe" -  likely fixed; she denies thought insertion, denies thought withdrawal, denies thought interruption, and denies thought broadcasting.  Patient denies active suicidal intent and denies passive suicidal ideation; she denies homicidal intent.  Thought Process The patient's thought process is linear and is concrete, not engaging in spontaneous conversation though responding to yes/no questions and short answers.  Insight The patient at the time of interview demonstrates poor insight, as evidenced by lacking understanding of mental health condition/s, inability to identify trigger/s causing  mental health decompensation, and inability to identify adaptive and maladaptive coping strategies.  Judgement The patient over the past 24 hours demonstrates fair judgement, as evidenced by adhering to medication regimen and minimally engaging with staff / other patients.  Assets  Assets: Manufacturing systems engineer; Physical Health; Resilience  Physical Exam: Constitutional:      Appearance: the patient is not toxic-appearing.  Pulmonary:     Effort: Pulmonary effort is normal.  Neurological:     General: No focal deficit present.     Mental Status: the patient is alert and oriented to person, place, and time.   Review of Systems  Respiratory:  Negative for shortness of breath.   Cardiovascular:  Negative for chest pain.  Gastrointestinal:  Negative for abdominal pain, constipation, diarrhea, nausea and vomiting.  Neurological:  Negative for headaches.   Blood pressure 117/89, pulse 94, temperature 98.5 F (36.9 C), temperature source Oral, resp. rate 20, SpO2 98 %. There is no height or weight on file to calculate BMI.  Treatment Plan Summary: Daily contact with patient to assess and evaluate symptoms and progress in treatment and Medication management  Diagnoses / Active Problems: Schizoaffective disorder (HCC) Principal Problem:   Schizoaffective disorder (HCC) Active Problems:   GAD (generalized anxiety disorder)   Vitamin D deficiency   PLAN: Safety and Monitoring:  -- Voluntary admission to inpatient psychiatric unit for safety, stabilization and treatment  -- Daily contact with patient to assess and evaluate symptoms and progress in treatment  -- Patient's case to be discussed in multi-disciplinary team meeting  -- Observation Level : q15 minute checks  -- Vital signs:  q12 hours  -- Precautions: suicide, elopement, and assault  2. Medications:              -- Continue Clozaril 200 qhs - for psychosis, CBC w/ differential wnl (10/17)  Targeted review of symptoms,  specific for clozapine: Malaise/Sedation: endorses Chest pain: denies Shortness of breath: denies Exertional capacity: denies Tachycardia: denies Cough: denies Sore Throat: denies Fever: denies Orthostatic hypotension (dizziness with standing): denies Hypersalivation: "improved" Constipation: denies Nausea: denies Nocturnal enuresis: denies  Symptoms of GERD -Acid reflux: denies -Burning sensation in chest, usually after eating: denies -Trouble swallowing: denies -Sensation of lump in throat: denies -Regurgitation (backwash) of food or sour liquid: denies  -- Continue atropine drops 1% 1 drop tid for sialorrhea.  -- Continue Ritalin 10 mg qam - for daytime sedation 2/2 clozapine and negative symptoms of schizophrenia. -- Continue Zoloft 100 mg daily for depression -- Continue Norvasc 10 mg daily for hypertension -- Continue atenolol 12.5 mg once daily - for tachycardia 2/2 clozapine  -- Continue Colace 100 mg 2 twice daily for constipation -- Continue Miralax daily for constipation -- Continue Ensure TID for nutritional support -- Continue Ferrous Sulfate 325 mg daily for iron replacement -- Continue Atarax 25 mg TID PRN for anxiety -- Continue Metformin 500 mg XL for weight gain prophylaxis               --  Previously discontinued 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  -- Previously discontinued geodon, caplyta, thorazine, zyprexa, risperdal due to no improvement (also concern for cheeking during previous antipsychotic medication trials)  -- Previously discontinued klonopin for anxiety associated with paranoid thoughts.  -- Completed - Diflucan 150 mg po daily for yeast infection. -- Completed - Megace for AUB   The risks/benefits/side-effects/alternatives to the above medication were discussed in detail with the patient and time was given for questions. The patient consents to medication trial. FDA black box warnings, if present, were discussed.  The patient is agreeable with the  medication plan, as above. We will monitor the patient's response to pharmacologic treatment, and adjust medications as necessary.  3. Routine and other pertinent labs:             -- Metabolic profile:  BMI: There is no height or weight on file to calculate BMI.  CBC    Component Value Date/Time   WBC 4.1 09/15/2022 0709   RBC 4.74 09/15/2022 0709   HGB 12.6 09/15/2022 0709   HGB 13.6 02/07/2020 1549   HCT 39.6 09/15/2022 0709   HCT 42.7 02/07/2020 1549   PLT 240 09/15/2022 0709   PLT 273 02/07/2020 1549   MCV 83.5 09/15/2022 0709   MCV 81 02/07/2020 1549   MCH 26.6 09/15/2022 0709   MCHC 31.8 09/15/2022 0709   RDW 14.2 09/15/2022 0709   RDW 13.4 02/07/2020 1549   LYMPHSABS 1.4 09/15/2022 0709   LYMPHSABS 1.9 12/05/2019 1530   MONOABS 0.3 09/15/2022 0709   EOSABS 0.2 09/15/2022 0709   EOSABS 0.3 12/05/2019 1530   BASOSABS 0.0 09/15/2022 0709   BASOSABS 0.0 12/05/2019 1530   Prolactin: Lab Results  Component Value Date   PROLACTIN 18.5 06/18/2022   PROLACTIN 58.3 (H) 06/06/2022    Lipid Panel: Lab Results  Component Value Date   CHOL 217 (H) 05/18/2022   TRIG 31 05/18/2022   HDL 71 05/18/2022   CHOLHDL 3.1 05/18/2022   VLDL 6 05/18/2022   LDLCALC 140 (H) 05/18/2022   LDLCALC 94 05/02/2021    HbgA1c: Hgb A1c MFr Bld (%)  Date Value  05/18/2022 5.2    TSH: TSH (uIU/mL)  Date Value  06/18/2022 0.964  02/26/2015 1.352    EKG monitoring: QTc: 450 (8/28)  Lab Results  Component Value Date   NEUTROABS 2.1 09/15/2022   4. Group Therapy:             -- Encouraged patient to participate in unit milieu and in scheduled group therapies              -- Short Term Goals: Ability to identify changes in lifestyle to reduce recurrence of condition will improve, Ability to verbalize feelings will improve, Ability to demonstrate self-control will improve, Ability to identify and develop effective coping behaviors will improve, Ability to maintain clinical  measurements within normal limits will improve, and Ability to identify triggers associated with substance abuse/mental health issues will improve             -- Long Term Goals: Improvement in symptoms so as ready for discharge -- Patient is encouraged to participate in group therapy while admitted to the psychiatric unit. -- We will address other chronic and acute stressors, which contributed to the patient's Schizoaffective disorder (HCC) in order to reduce the risk of self-harm at discharge.  5. Discharge Planning:   -- Social work and case management to assist with discharge planning and identification of hospital follow-up needs  prior to discharge            -- Estimated LOS: dc not before 09-29-22 per court  -- Discharge Concerns: Need to establish a safety plan; Medication compliance and effectiveness  -- Discharge Goals: Return home with outpatient referrals for mental health follow-up including medication management/psychotherapy  I certify that inpatient services furnished can reasonably be expected to improve the patient's condition.    I discussed my assessment, planned testing and intervention for the patient with Dr. Winfred Leeds who agrees with my formulated course of action.  Camelia Phenes, MD, PGY-1 09/19/2022, 7:24 AM

## 2022-09-19 NOTE — Progress Notes (Signed)
   09/19/22 2243  Psych Admission Type (Psych Patients Only)  Admission Status Voluntary  Psychosocial Assessment  Patient Complaints None  Eye Contact Fair  Facial Expression Flat (brightens with conversatoin)  Affect Appropriate to circumstance  Speech Logical/coherent;Soft  Interaction Childlike  Motor Activity Other (Comment) (WDL)  Appearance/Hygiene Improved  Behavior Characteristics Appropriate to situation  Mood Pleasant  Thought Process  Coherency WDL  Content WDL  Delusions None reported or observed  Perception WDL  Hallucination None reported or observed  Judgment Limited  Confusion None  Danger to Self  Current suicidal ideation? Denies  Self-Injurious Behavior No self-injurious ideation or behavior indicators observed or expressed   Agreement Not to Harm Self Yes  Description of Agreement verbal  Danger to Others  Danger to Others None reported or observed

## 2022-09-19 NOTE — Progress Notes (Signed)
Pt presents with pleasant mood, affect blunted today. Honesty forwards little but denies any acute concerns, denies depression and states she is '' fine. '' She is quiet, avoid eye contact but has been visible attending programming and interacting appropriately with peers and staff on the unit. She denies any new concerns or questions . No acute physical concerns. Pt compliant with am medications.  Denies any SI or HI or A/ V Hallucinations. Lashay did not complete her self inventory today. Pt continues to be able to make her needs known, and is not in any acute distress. She completed her self inventory , she rates her depression, anxiety and hopelessness at a 4/10 on scale, 10 being worst, 0 being none.  Per treatment team, pt continues to be placement issue, awaiting follow up from social work. Pt is safe, will continue.

## 2022-09-19 NOTE — Progress Notes (Signed)
Adult Psychoeducational Group Note  Date:  09/19/2022 Time:  9:23 PM  Group Topic/Focus:  Wrap-Up Group:   The focus of this group is to help patients review their daily goal of treatment and discuss progress on daily workbooks.  Participation Level:  Active  Participation Quality:  Appropriate  Affect:  Appropriate  Cognitive:  Appropriate  Insight: Appropriate  Engagement in Group:  Engaged  Modes of Intervention:  Education and Exploration  Additional Comments:  Patient attended and participated in group tonight. She report that going outside woas significant to her today. She like that she is resourceful.  Salley [address] Saachi Zale [facility] 09/19/2022, 9:23 PM

## 2022-09-19 NOTE — [facility] Group Notes (Signed)
Adult Psychoeducational Group Note  Date:  09/19/2022 Time:  10:01 AM  Group Topic/Focus:  Goals Group:   The focus of this group is to help patients establish daily goals to achieve during treatment and discuss how the patient can incorporate goal setting into their daily lives to aide in recovery.  Participation Level:  Active  Participation Quality:  Appropriate  Affect:  Appropriate  Cognitive:  Appropriate  Insight: Appropriate  Engagement in Group:  Engaged  Modes of Intervention:  Education  Kern Reap 09/19/2022, 10:01 AM

## 2022-09-19 NOTE — [facility] Group Notes (Signed)
Psychoeducational Group- Patients were given activity in which they were asked to reflect on negative coping skills and barriers they had to developing positive ones. Patients were educated on the impact of vulnerability and how that can impact mental health.  Pt attended group and was appropriate.  

## 2022-09-19 NOTE — Group Note (Signed)
[place] [facility] LCSW Group Therapy Note  09/19/2022  10:00-11:00AM  Type of Therapy and Topic:  Group Therapy:  Grief   Participation Level:  Minimal   Description of Group:  Patients in this group asked to talk about grief during this session.  As the topic was introduced, we talked about grief involving death much of the time, but also that it is the emotion we may experience at the loss of a relationship, job, pet, etc.  Patients were introduced to the well-known [employer] of 5 stages of grief (Denial, Anger, Bargaining, Depression, Acceptance) and the fact that these stages are often not linear and people may go through one or more stages numerous times, although with greater ease each subsequent time.  This led to a discussion about patients' sources of grief and what their experiences have been in going through this process.  Therapeutic Goals: 1)  learn about the 5 stages of grief 2)  share patients' direct experiences in going through these stages  3)  give an opportunity to each patient to say out loud what they would like to say to a person they are currently grieving  4) discuss actions that could possibly help in the grief process such as writing a letter, journaling, listening to music, exercising, saying names out loud, and more   Summary of Patient Progress:  The patient stated that she has no recent grief issues, but when she does have these, the coping skills she uses are music, getting out in nature, and exercise.  She declined to answer every other question.  Therapeutic Modalities:   Motivational Interviewing Processing  Maretta Los

## 2022-09-20 DIAGNOSIS — F251 Schizoaffective disorder, depressive type: Secondary | ICD-10-CM | POA: Diagnosis not present

## 2022-09-20 NOTE — Progress Notes (Signed)
[facility] MD Progress Note  09/20/2022 11:32 AM Cheryl Adams  MRN:  [id number]  Principal Problem: Schizoaffective disorder (HCC) Diagnosis: Principal Problem:   Schizoaffective disorder (HCC) Active Problems:   GAD (generalized anxiety disorder)   Vitamin D deficiency   Reason for Admission:  Cheryl Adams is a 31 year old African-American female with prior diagnoses of schizoaffective d/o depressive type and GAD who initially presented to the [facility] urgent care [facility]) accompanied by her mother with complaints of paranoia. Pt was initially admittedon 05-19-2022, for many months, and treated for depression and paranoia with multiple medication and ultimately had partial response to clozapine. DSS was granted legal gaurdianship. On 09-10-2022, pt was d/c for MDE evaluation per court. On 09-10-2022, the pt was  readmitted to the psych unit after the evaluation (admitted on 09/10/2022, total  LOS: 10 days )  Chart Review from last 24 hours:  The patient's chart was reviewed and nursing notes were reviewed. The patient's case was discussed in multidisciplinary team meeting.   - Patient received all scheduled medications - Patient did not receive any PRN medications  Information Obtained Today During Patient Interview: Upon evaluation this morning patient presents calm and cooperative, reports fair mood in general, reports fair sleep and appetite, spending most of the time in the day room and attending groups with no issues reported, continues to deny feeling depressed or anxious, denies SI HI or AVH denies feeling hopeless or helpless, denies any paranoia or other delusions.  Denies side effect of medication and compliant on the unit with no issues reported.  She presents able to answer questions in a linear yet concrete manner, does not appear responding to stimuli or paranoid or disorganized.  Past Psychiatric History:  Past Surgical History:  Procedure Laterality Date    TONSILLECTOMY AND ADENOIDECTOMY Bilateral 11/14/2014   Procedure: BILATERAL TONSILLECTOMY AND ADENOIDECTOMY;  Surgeon: Flo Shanks, MD;  Location: [facility];  Service: ENT;  Laterality: Bilateral;   TYMPANOSTOMY TUBE PLACEMENT     Past Medical History:  Past Medical History:  Diagnosis Date   Anemia    Chronic tonsillitis 10/2014   Cough 11/09/2014   Difficulty swallowing pills    No psychiatric disorder found after evaluation 04/14/2022   Family History: History reviewed. No pertinent family history. Family Psychiatric  History: Schizophrenia- Maternal grandfather.  Social History: Marital status: Single Are you sexually active?: No What is your sexual orientation?: Heterosexual Has your sexual activity been affected by drugs, alcohol, medication, or emotional stress?: No Does patient have children?: No   Current Medications: Current Facility-Administered Medications  Medication Dose Route Frequency Provider Last Rate Last Admin   acetaminophen (TYLENOL) tablet 650 mg  650 mg Oral Q6H PRN Massengill, Nathan, MD       alum & mag hydroxide-simeth (MAALOX/MYLANTA) 200-200-20 MG/5ML suspension 30 mL  30 mL Oral Q4H PRN Massengill, Nathan, MD       amLODipine (NORVASC) tablet 10 mg  10 mg Oral Daily Massengill, Nathan, MD   10 mg at 09/20/22 2130   antiseptic oral rinse (BIOTENE) solution 15 mL  15 mL Mouth Rinse 5 X Daily PRN Massengill, Harrold Donath, MD       atenolol (TENORMIN) tablet 12.5 mg  12.5 mg Oral Daily Massengill, Nathan, MD   12.5 mg at 09/20/22 0733   atropine 1 % ophthalmic solution 1 drop  1 drop Sublingual TID Phineas Inches, MD   1 drop at 09/20/22 1130   cloZAPine (CLOZARIL) tablet 200 mg  200 mg Oral QHS Massengill, Nathan, MD   200 mg at 09/19/22 2110   docusate sodium (COLACE) capsule 100 mg  100 mg Oral BID Massengill, Ovid Curd, MD   100 mg at 09/20/22 1751   ferrous sulfate tablet 325 mg  325 mg Oral Daily Massengill, Ovid Curd, MD   325 mg at 09/20/22  0734   magnesium hydroxide (MILK OF MAGNESIA) suspension 30 mL  30 mL Oral Daily PRN Massengill, Ovid Curd, MD       metFORMIN (GLUCOPHAGE-XR) 24 hr tablet 500 mg  500 mg Oral Q breakfast Massengill, Ovid Curd, MD   500 mg at 09/20/22 0734   methylphenidate (RITALIN) tablet 10 mg  10 mg Oral Daily Massengill, Ovid Curd, MD   10 mg at 09/20/22 0737   polyethylene glycol (MIRALAX / GLYCOLAX) packet 17 g  17 g Oral Daily Massengill, Ovid Curd, MD   17 g at 09/20/22 0734   sertraline (ZOLOFT) tablet 100 mg  100 mg Oral Daily Massengill, Ovid Curd, MD   100 mg at 09/20/22 0258   vitamin D3 (CHOLECALCIFEROL) tablet 1,000 Units  1,000 Units Oral Daily Janine Limbo, MD   1,000 Units at 09/20/22 5277   Lab Results:  No results found for this or any previous visit (from the past 48 hour(s)).  Blood Alcohol level:  Lab Results  Component Value Date   ETH <10 10/16/2021   ETH <10 82/42/3536   Metabolic Labs: Lab Results  Component Value Date   HGBA1C 5.2 05/18/2022   MPG 102.54 05/18/2022   MPG 120 04/29/2021   Lab Results  Component Value Date   PROLACTIN 18.5 06/18/2022   PROLACTIN 58.3 (H) 06/06/2022   Lab Results  Component Value Date   CHOL 217 (H) 05/18/2022   TRIG 31 05/18/2022   HDL 71 05/18/2022   CHOLHDL 3.1 05/18/2022   VLDL 6 05/18/2022   LDLCALC 140 (H) 05/18/2022   LDLCALC 94 05/02/2021   Sleep:No data recorded  Physical Findings: AIMS: No  CIWA:    COWS:     Mental Status Exam:  Appearance and Grooming: Casually dressed, unkempt with below average hygiene  Behavior: Cooperative and calm Attitude: Patient was cooperative during the interview.  Motor activity: Mild psychomotor retardation noted but at baseline  Patient does not appear to be responding to external stimuli.  Speech: Decreased amount, normal tone and volume, at baseline  Mood: "I feel fair"  Affect: Euthymic/restricted affect at  baseline -------------------------------------------------------------------------------------------------------------------------  Thought Content Denies SI HI or AVH, does not appear responding to stimuli, no paranoia or other delusions noted or observed during evaluation.  Thought Process Linear and goal directed yes concrete  Insight Poor Judgement Fair yet limited, compliant with care in the hospital  Assets  Assets: Communication Skills; Physical Health; Resilience  Physical Exam: Constitutional:      Appearance: the patient is not toxic-appearing.  Pulmonary:     Effort: Pulmonary effort is normal.  Neurological:     General: No focal deficit present.     Mental Status: the patient is alert and oriented to person, place, and time.   Review of Systems  Respiratory:  Negative for shortness of breath.   Cardiovascular:  Negative for chest pain.  Gastrointestinal:  Negative for abdominal pain, constipation, diarrhea, nausea and vomiting.  Neurological:  Negative for headaches.   Blood pressure (!) 130/93, pulse 88, temperature 97.8 F (36.6 C), temperature source Oral, resp. rate 20, SpO2 100 %. There is no height or weight on file to calculate BMI.  Treatment Plan Summary: Daily contact with patient to assess and evaluate symptoms and progress in treatment and Medication management  Diagnoses / Active Problems: Schizoaffective disorder (HCC) Principal Problem:   Schizoaffective disorder (HCC) Active Problems:   GAD (generalized anxiety disorder)   Vitamin D deficiency   PLAN: Safety and Monitoring:  -- Voluntary admission to inpatient psychiatric unit for safety, stabilization and treatment  -- Daily contact with patient to assess and evaluate symptoms and progress in treatment  -- Patient's case to be discussed in multi-disciplinary team meeting  -- Observation Level : q15 minute checks  -- Vital signs:  q12 hours  -- Precautions: suicide, elopement, and  assault  2. Medications:              -- Continue Clozaril 200 qhs - for psychosis, CBC w/ differential wnl (10/17)  Targeted review of symptoms, specific for clozapine: Malaise/Sedation: endorses Chest pain: denies Shortness of breath: denies Exertional capacity: denies Tachycardia: denies Cough: denies Sore Throat: denies Fever: denies Orthostatic hypotension (dizziness with standing): denies Hypersalivation: "improved" Constipation: denies Nausea: denies Nocturnal enuresis: denies  Symptoms of GERD -Acid reflux: denies -Burning sensation in chest, usually after eating: denies -Trouble swallowing: denies -Sensation of lump in throat: denies -Regurgitation (backwash) of food or sour liquid: denies  -- Continue atropine drops 1% 1 drop tid for sialorrhea.  -- Continue Ritalin 10 mg qam - for daytime sedation 2/2 clozapine and negative symptoms of schizophrenia. -- Continue Zoloft 100 mg daily for depression -- Continue Norvasc 10 mg daily for hypertension -- Continue atenolol 12.5 mg once daily - for tachycardia 2/2 clozapine  -- Continue Colace 100 mg 2 twice daily for constipation -- Continue Miralax daily for constipation -- Continue Ensure TID for nutritional support -- Continue Ferrous Sulfate 325 mg daily for iron replacement -- Continue Atarax 25 mg TID PRN for anxiety -- Continue Metformin 500 mg XL for weight gain prophylaxis               -- Previously discontinued 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  -- Previously discontinued geodon, caplyta, thorazine, zyprexa, risperdal due to no improvement (also concern for cheeking during previous antipsychotic medication trials)  -- Previously discontinued klonopin for anxiety associated with paranoid thoughts.  -- Completed - Diflucan 150 mg po daily for yeast infection. -- Completed - Megace for AUB   The risks/benefits/side-effects/alternatives to the above medication were discussed in detail with the patient and time was given  for questions. The patient consents to medication trial. FDA black box warnings, if present, were discussed.  The patient is agreeable with the medication plan, as above. We will monitor the patient's response to pharmacologic treatment, and adjust medications as necessary.  3. Routine and other pertinent labs:             -- Metabolic profile:  BMI: There is no height or weight on file to calculate BMI.  CBC    Component Value Date/Time   WBC 4.1 09/15/2022 0709   RBC 4.74 09/15/2022 0709   HGB 12.6 09/15/2022 0709   HGB 13.6 02/07/2020 1549   HCT 39.6 09/15/2022 0709   HCT 42.7 02/07/2020 1549   PLT 240 09/15/2022 0709   PLT 273 02/07/2020 1549   MCV 83.5 09/15/2022 0709   MCV 81 02/07/2020 1549   MCH 26.6 09/15/2022 0709   MCHC 31.8 09/15/2022 0709   RDW 14.2 09/15/2022 0709   RDW 13.4 02/07/2020 1549   LYMPHSABS 1.4 09/15/2022 0709   LYMPHSABS 1.9  12/05/2019 1530   MONOABS 0.3 09/15/2022 0709   EOSABS 0.2 09/15/2022 0709   EOSABS 0.3 12/05/2019 1530   BASOSABS 0.0 09/15/2022 0709   BASOSABS 0.0 12/05/2019 1530   Prolactin: Lab Results  Component Value Date   PROLACTIN 18.5 06/18/2022   PROLACTIN 58.3 (H) 06/06/2022    Lipid Panel: Lab Results  Component Value Date   CHOL 217 (H) 05/18/2022   TRIG 31 05/18/2022   HDL 71 05/18/2022   CHOLHDL 3.1 05/18/2022   VLDL 6 05/18/2022   LDLCALC 140 (H) 05/18/2022   LDLCALC 94 05/02/2021    HbgA1c: Hgb A1c MFr Bld (%)  Date Value  05/18/2022 5.2    TSH: TSH (uIU/mL)  Date Value  06/18/2022 0.964  02/26/2015 1.352    EKG monitoring: QTc: 450 (8/28)  Lab Results  Component Value Date   NEUTROABS 2.1 09/15/2022   4. Group Therapy:             -- Encouraged patient to participate in unit milieu and in scheduled group therapies              -- Short Term Goals: Ability to identify changes in lifestyle to reduce recurrence of condition will improve, Ability to verbalize feelings will improve, Ability to  demonstrate self-control will improve, Ability to identify and develop effective coping behaviors will improve, Ability to maintain clinical measurements within normal limits will improve, and Ability to identify triggers associated with substance abuse/mental health issues will improve             -- Long Term Goals: Improvement in symptoms so as ready for discharge -- Patient is encouraged to participate in group therapy while admitted to the psychiatric unit. -- We will address other chronic and acute stressors, which contributed to the patient's Schizoaffective disorder (HCC) in order to reduce the risk of self-harm at discharge.  5. Discharge Planning:   -- Social work and case management to assist with discharge planning and identification of hospital follow-up needs prior to discharge            -- Estimated LOS: dc not before 09-29-22 per court  -- Discharge Concerns: Need to establish a safety plan; Medication compliance and effectiveness  -- Discharge Goals: Return home with outpatient referrals for mental health follow-up including medication management/psychotherapy  I certify that inpatient services furnished can reasonably be expected to improve the patient's condition.    Geselle Cardosa Abbott Pao, MD, 09/20/2022, 11:32 AM

## 2022-09-20 NOTE — Group Note (Signed)
LCSW Group Therapy Note  09/20/2022      Type of Therapy and Topic:  Group Therapy: Gratitude  Participation Level:  Minimal   Description of Group:   In this group, patients shared and discussed the importance of acknowledging the elements in their lives for which they are grateful and how this can positively impact their mood.  The group discussed how bringing the positive elements of their lives to the forefront of their minds can help with recovery from any illness, physical or mental.  An exercise was done as a group in which a list was made of gratitude items in order to encourage participants to consider other potential positives in their lives.  Therapeutic Goals: Patients will identify one or more item for which they are grateful in each of 6 categories:  people, experiences, things, places, skills, and other. Patients will discuss how it is possible to seek out gratitude in even bad situations. Patients will explore other possible items of gratitude that they could remember.   Summary of Patient Progress:  The patient shared that she is grateful for being here, getting help, and for her counselor and doctor.  Patient's reaction to the group was neutral.  Therapeutic Modalities:   Solution-Focused Therapy Activity  Berlin Hun Grossman-Orr, LCSW .

## 2022-09-20 NOTE — Progress Notes (Signed)
   09/20/22 2220  Psych Admission Type (Psych Patients Only)  Admission Status Voluntary  Psychosocial Assessment  Patient Complaints Anxiety  Eye Contact Fair  Facial Expression Flat  Affect Appropriate to circumstance  Speech Logical/coherent  Interaction Childlike  Motor Activity Other (Comment) (WDL)  Appearance/Hygiene Unremarkable  Behavior Characteristics Appropriate to situation  Mood Pleasant  Thought Process  Coherency WDL  Content WDL  Delusions None reported or observed  Perception WDL  Hallucination None reported or observed  Judgment Limited  Confusion None  Danger to Self  Current suicidal ideation? Denies  Self-Injurious Behavior No self-injurious ideation or behavior indicators observed or expressed   Agreement Not to Harm Self Yes  Description of Agreement verbal  Danger to Others  Danger to Others None reported or observed

## 2022-09-20 NOTE — Progress Notes (Signed)
Adult Psychoeducational Group Note  Date:  09/20/2022 Time:  9:24 PM  Group Topic/Focus:  Wrap-Up Group:   The focus of this group is to help patients review their daily goal of treatment and discuss progress on daily workbooks.  Participation Level:  Did Not Attend  Participation Quality:   n/a  Affect:   n/a  Cognitive:   n/a  Insight: None  Engagement in Group:   n/a  Modes of Intervention:   n/a  Additional Comments:   Pt did not attend the Wrap Up group.  [name] Cheryl Adams 09/20/2022, 9:24 PM

## 2022-09-20 NOTE — Progress Notes (Signed)
Nurse discussed coping skills, anxiety and coping skills with patient.

## 2022-09-20 NOTE — Plan of Care (Addendum)
D:  Patient's self inventory sheet, patient has fair sleep, sleep medication helpful.  Fair appetite, normal energy level, good concentration.  Rated depression, hopeless and anxiety 4.  Denied withdrawals.  Denied SI.  Denied physical problems.  Denied physical pain.  No discharge plans. A:  Medications administered per MD orders.  Emotional support and encouragement given patient. R:  Denied SI and HI, contracts for safety.  Denied A/V hallucinations.  Safety maintained with 15 minute checks.  Patient stated she slept 7 hrs last night.  Patient stated she feels OK, good.

## 2022-09-21 ENCOUNTER — Encounter (HOSPITAL_COMMUNITY): Payer: Self-pay

## 2022-09-21 DIAGNOSIS — F251 Schizoaffective disorder, depressive type: Secondary | ICD-10-CM | POA: Diagnosis not present

## 2022-09-21 NOTE — [facility] Counselor (Signed)
CSW received a voicemail from the [name] legal guardian, Cheryl Adams [phone] that stated that there was a [place] willing to set up an interview with the Pt.  CSW contacted the group home, [facility], and was informed that it is an independent living home.  Cheryl Adams (counselor at [employer]) states that there is no supervision and that the Pt would have to take all of her own medication from a bubble pack, take care of her own apartment, and be able to maintain health and hygiene on her own.  CSW explained to [place] that the Pt can bathe herself and can clean her own home but may not be able to manage her medications on her own.  CSW also explained that the Pt is on Clozaril and will need labs drawn every week.  CSW stated that ACTT Services could be added for the Pt.  Cheryl Adams stated that the Pt cannot get both ACTT and PSR services at the same time.  Cheryl Adams was agreeable to set up an interview with the Pt.  CSW spoke with Cheryl Adams who stated that the home did not sound appropriate for the Pt.  She states that the Pt will likely need a group home that can assist with her medications.  She states that DSS has contacted several group homes but has not received any calls back about admission.  Cheryl Adams states that she will continue looking for placement for the Pt.

## 2022-09-21 NOTE — Progress Notes (Signed)
   09/21/22 1000  Psych Admission Type (Psych Patients Only)  Admission Status Voluntary  Psychosocial Assessment  Patient Complaints Depression  Eye Contact Fair  Facial Expression Flat  Affect Appropriate to circumstance  Speech Logical/coherent  Interaction Childlike  Motor Activity Other (Comment)  Appearance/Hygiene Body odor  Behavior Characteristics Cooperative  Mood Pleasant  Thought Process  Coherency WDL  Content WDL  Delusions None reported or observed  Perception WDL  Hallucination None reported or observed  Judgment Limited  Confusion None  Danger to Self  Current suicidal ideation? Denies  Danger to Others  Danger to Others None reported or observed

## 2022-09-21 NOTE — BH IP Treatment Plan (Signed)
Interdisciplinary Treatment and Diagnostic Plan Update  09/21/2022 Time of Session: update JAILEE JAQUEZ MRN: [id number]  Principal Diagnosis: Schizoaffective disorder [facility])  Secondary Diagnoses: Principal Problem:   Schizoaffective disorder ([place]) Active Problems:   GAD (generalized anxiety disorder)   Vitamin D deficiency   Current Medications:  Current Facility-Administered Medications  Medication Dose Route Frequency Provider Last Rate Last Admin   acetaminophen (TYLENOL) tablet 650 mg  650 mg Oral Q6H PRN Massengill, Ovid Curd, MD       alum & mag hydroxide-simeth (MAALOX/MYLANTA) 200-200-20 MG/5ML suspension 30 mL  30 mL Oral Q4H PRN Massengill, Nathan, MD       amLODipine (NORVASC) tablet 10 mg  10 mg Oral Daily Massengill, Nathan, MD   10 mg at 09/21/22 0816   antiseptic oral rinse (BIOTENE) solution 15 mL  15 mL Mouth Rinse 5 X Daily PRN Massengill, Ovid Curd, MD       atenolol (TENORMIN) tablet 12.5 mg  12.5 mg Oral Daily Massengill, Nathan, MD   12.5 mg at 09/21/22 0816   atropine 1 % ophthalmic solution 1 drop  1 drop Sublingual TID Janine Limbo, MD   1 drop at 09/21/22 1256   cloZAPine (CLOZARIL) tablet 200 mg  200 mg Oral QHS Massengill, Ovid Curd, MD   200 mg at 09/20/22 2111   docusate sodium (COLACE) capsule 100 mg  100 mg Oral BID Massengill, Ovid Curd, MD   100 mg at 09/21/22 8185   ferrous sulfate tablet 325 mg  325 mg Oral Daily Massengill, Ovid Curd, MD   325 mg at 09/21/22 6314   magnesium hydroxide (MILK OF MAGNESIA) suspension 30 mL  30 mL Oral Daily PRN Massengill, Ovid Curd, MD       metFORMIN (GLUCOPHAGE-XR) 24 hr tablet 500 mg  500 mg Oral Q breakfast Massengill, Nathan, MD   500 mg at 09/21/22 0816   methylphenidate (RITALIN) tablet 10 mg  10 mg Oral Daily Massengill, Ovid Curd, MD   10 mg at 09/21/22 0817   polyethylene glycol (MIRALAX / GLYCOLAX) packet 17 g  17 g Oral Daily Massengill, Ovid Curd, MD   17 g at 09/21/22 0818   sertraline (ZOLOFT) tablet 100 mg  100 mg Oral  Daily Massengill, Ovid Curd, MD   100 mg at 09/21/22 9702   vitamin D3 (CHOLECALCIFEROL) tablet 1,000 Units  1,000 Units Oral Daily Massengill, Ovid Curd, MD   1,000 Units at 09/21/22 0817   PTA Medications: Medications Prior to Admission  Medication Sig Dispense Refill Last Dose   amLODipine (NORVASC) 10 MG tablet Take 1 tablet (10 mg total) by mouth daily.      antiseptic oral rinse (BIOTENE) LIQD 15 mLs by Mouth Rinse route 5 (five) times daily as needed for dry mouth.      atenolol (TENORMIN) 25 MG tablet Take 0.5 tablets (12.5 mg total) by mouth daily.      atropine 1 % ophthalmic solution Place 1 drop under the tongue 3 (three) times daily. 2 mL 12    cloZAPine (CLOZARIL) 200 MG tablet Take 1 tablet (200 mg total) by mouth at bedtime.      docusate sodium (COLACE) 100 MG capsule Take 1 capsule (100 mg total) by mouth 2 (two) times daily. 10 capsule 0    ferrous sulfate 325 (65 FE) MG tablet Take 325 mg by mouth daily.      metFORMIN (GLUCOPHAGE-XR) 500 MG 24 hr tablet Take 1 tablet (500 mg total) by mouth daily with breakfast.      methylphenidate (RITALIN) 10 MG  tablet Take 1 tablet (10 mg total) by mouth daily.  0    polyethylene glycol (MIRALAX / GLYCOLAX) 17 g packet Take 17 g by mouth daily. 14 each 0    sertraline (ZOLOFT) 100 MG tablet Take 1 tablet (100 mg total) by mouth daily.      vitamin D3 (CHOLECALCIFEROL) 25 MCG tablet Take 1 tablet (1,000 Units total) by mouth daily. 60 tablet      Patient Stressors:    Patient Strengths:    Treatment Modalities: Medication Management, Group therapy, Case management,  1 to 1 session with clinician, Psychoeducation, Recreational therapy.   Physician Treatment Plan for Primary Diagnosis: Schizoaffective disorder ([name]) Long Term Goal(s): Improvement in symptoms so as ready for discharge   Short Term Goals: Ability to identify changes in lifestyle to reduce recurrence of condition will improve Ability to verbalize feelings will  improve Ability to demonstrate self-control will improve Ability to identify and develop effective coping behaviors will improve Ability to maintain clinical measurements within normal limits will improve Ability to identify triggers associated with substance abuse/mental health issues will improve  Medication Management: Evaluate patient's response, side effects, and tolerance of medication regimen.  Therapeutic Interventions: 1 to 1 sessions, Unit Group sessions and Medication administration.  Evaluation of Outcomes: Adequate for Discharge  Physician Treatment Plan for Secondary Diagnosis: Principal Problem:   Schizoaffective disorder ([name]) Active Problems:   GAD (generalized anxiety disorder)   Vitamin D deficiency  Long Term Goal(s): Improvement in symptoms so as ready for discharge   Short Term Goals: Ability to identify changes in lifestyle to reduce recurrence of condition will improve Ability to verbalize feelings will improve Ability to demonstrate self-control will improve Ability to identify and develop effective coping behaviors will improve Ability to maintain clinical measurements within normal limits will improve Ability to identify triggers associated with substance abuse/mental health issues will improve     Medication Management: Evaluate patient's response, side effects, and tolerance of medication regimen.  Therapeutic Interventions: 1 to 1 sessions, Unit Group sessions and Medication administration.  Evaluation of Outcomes: Adequate for Discharge   RN Treatment Plan for Primary Diagnosis: Schizoaffective disorder ([name]) Long Term Goal(s): Knowledge of disease and therapeutic regimen to maintain health will improve  Short Term Goals: Ability to remain free from injury will improve, Ability to verbalize frustration and anger appropriately will improve, Ability to demonstrate self-control, Ability to participate in decision making will improve, Ability to  verbalize feelings will improve, Ability to disclose and discuss suicidal ideas, Ability to identify and develop effective coping behaviors will improve, and Compliance with prescribed medications will improve  Medication Management: RN will administer medications as ordered by provider, will assess and evaluate patient's response and provide education to patient for prescribed medication. RN will report any adverse and/or side effects to prescribing provider.  Therapeutic Interventions: 1 on 1 counseling sessions, Psychoeducation, Medication administration, Evaluate responses to treatment, Monitor vital signs and CBGs as ordered, Perform/monitor CIWA, COWS, AIMS and Fall Risk screenings as ordered, Perform wound care treatments as ordered.  Evaluation of Outcomes: Adequate for Discharge   LCSW Treatment Plan for Primary Diagnosis: Schizoaffective disorder [facility]) Long Term Goal(s): Safe transition to appropriate next level of care at discharge, Engage patient in therapeutic group addressing interpersonal concerns.  Short Term Goals: Engage patient in aftercare planning with referrals and resources, Increase social support, Increase ability to appropriately verbalize feelings, Increase emotional regulation, Facilitate acceptance of mental health diagnosis and concerns, Facilitate patient progression through stages of  change regarding substance use diagnoses and concerns, Identify triggers associated with mental health/substance abuse issues, and Increase skills for wellness and recovery  Therapeutic Interventions: Assess for all discharge needs, 1 to 1 time with Social worker, Explore available resources and support systems, Assess for adequacy in community support network, Educate family and significant other(s) on suicide prevention, Complete Psychosocial Assessment, Interpersonal group therapy.  Evaluation of Outcomes: Adequate for Discharge   Progress in Treatment: Attending groups:  Yes. Participating in groups: Yes. Taking medication as prescribed: Yes. Toleration medication: Yes. Family/Significant other contact made: Yes, individual(s) contacted:  mother Alyssaann Donoso [phone] (Mother)  Patient understands diagnosis: No. Discussing patient identified problems/goals with staff: Yes. Medical problems stabilized or resolved: Yes. Denies suicidal/homicidal ideation: Yes. Issues/concerns per patient self-inventory: No.   New problem(s) identified: No, Describe:  none reported   New Short Term/Long Term Goal(s):   medication stabilization, elimination of SI thoughts, development of comprehensive mental wellness plan.    Patient Goals: No additional goals identified at this time. Patient to continue to work towards original goals identified in initial treatment team meeting. CSW will remain available to patient should they voice additional treatment goals.   Discharge Plan or Barriers: Patient currently awaiting placement from [facility] guardian.  Patient has been evaluated for IDD and can not care for herself on her own.  Patient does not have benefits at this time.  Patient has been connected with ACTT services upon discharge.   Reason for Continuation of Hospitalization: Other; describe Placement   Estimated Length of Stay: TBD or Nov 1st  Last [place] Suicide Severity Risk Score: Flowsheet Row Admission (Discharged) from 05/19/2022 in [place] [facility] ED from 05/18/2022 in [facility] Office Visit from 04/14/2022 in [place] No Risk High Risk No Risk       Last PHQ 2/9 Scores:    04/14/2022   10:46 AM 02/24/2022   11:12 AM 01/13/2022    9:14 AM  Depression screen PHQ 2/9  Decreased Interest 0 0 0  Down, Depressed, Hopeless 0 0 0  PHQ - 2 Score 0 0 0    Scribe for Treatment Team: Zachery Conch, LCSW 09/21/2022 3:18 PM

## 2022-09-21 NOTE — Progress Notes (Signed)
[facility] MD Progress Note  09/21/2022 7:09 AM Cheryl Adams  MRN:  [id number]  Principal Problem: Schizoaffective disorder (HCC) Diagnosis: Principal Problem:   Schizoaffective disorder (HCC) Active Problems:   GAD (generalized anxiety disorder)   Vitamin D deficiency   Reason for Admission:  Cheryl Adams is a 31 year old African-American female with prior diagnoses of schizoaffective d/o depressive type and GAD who initially presented to the [facility] health urgent care [facility]) accompanied by her mother with complaints of paranoia. Pt was initially admittedon 05-19-2022, for many months, and treated for depression and paranoia with multiple medication and ultimately had partial response to clozapine. DSS was granted legal gaurdianship. On 09-10-2022, pt was d/c for MDE evaluation per court. On 09-10-2022, the pt was  readmitted to the psych unit after the evaluation (admitted on 09/10/2022, total  LOS: 11 days )  Chart Review from last 24 hours:  The patient's chart was reviewed and nursing notes were reviewed. The patient's case was discussed in multidisciplinary team meeting.   - Patient received all scheduled medications - Patient did not receive any PRN medications  Information Obtained Today During Patient Interview: The patient was seen and evaluated on the unit. On assessment today the patient is at baseline and interview was largely unchanged from yesterday or the prior days. She responds appropriately to yes/no questions and gives short answers.   Patient endorses good sleep, good appetite.   The patient experiences no hallucinations. The patient does not spontaneously discuss delusional thoughts, but when prompted, continues to express persecutory delusions about family members, stating "it's not safe" - likely fixed; she denies thought insertion, denies thought withdrawal, denies thought interruption, and denies thought broadcasting.   Patient denies active suicidal  intent and denies passive suicidal ideation; she denies homicidal intent.   Patient denies any side-effects attributable to medications. Patient denies any somatic complaints, denies feeling tired.  Also continues to note that her sialorrhea is "improved," but says it has not completely disappeared.  Past Psychiatric History:  Past Surgical History:  Procedure Laterality Date   TONSILLECTOMY AND ADENOIDECTOMY Bilateral 11/14/2014   Procedure: BILATERAL TONSILLECTOMY AND ADENOIDECTOMY;  Surgeon: Flo Shanks, MD;  Location: [facility];  Service: ENT;  Laterality: Bilateral;   TYMPANOSTOMY TUBE PLACEMENT     Past Medical History:  Past Medical History:  Diagnosis Date   Anemia    Chronic tonsillitis 10/2014   Cough 11/09/2014   Difficulty swallowing pills    No psychiatric disorder found after evaluation 04/14/2022   Family History: History reviewed. No pertinent family history. Family Psychiatric  History: Schizophrenia- Maternal grandfather.  Social History: Marital status: Single Are you sexually active?: No What is your sexual orientation?: Heterosexual Has your sexual activity been affected by drugs, alcohol, medication, or emotional stress?: No Does patient have children?: No   Current Medications: Current Facility-Administered Medications  Medication Dose Route Frequency Provider Last Rate Last Admin   acetaminophen (TYLENOL) tablet 650 mg  650 mg Oral Q6H PRN Massengill, Nathan, MD       alum & mag hydroxide-simeth (MAALOX/MYLANTA) 200-200-20 MG/5ML suspension 30 mL  30 mL Oral Q4H PRN Massengill, Nathan, MD       amLODipine (NORVASC) tablet 10 mg  10 mg Oral Daily Massengill, Nathan, MD   10 mg at 09/20/22 0733   antiseptic oral rinse (BIOTENE) solution 15 mL  15 mL Mouth Rinse 5 X Daily PRN Massengill, Harrold Donath, MD       atenolol (TENORMIN) tablet 12.5 mg  12.5 mg Oral Daily Massengill, Nathan, MD   12.5 mg at 09/20/22 3299   atropine 1 % ophthalmic solution  1 drop  1 drop Sublingual TID Phineas Inches, MD   1 drop at 09/20/22 1710   cloZAPine (CLOZARIL) tablet 200 mg  200 mg Oral QHS Massengill, Harrold Donath, MD   200 mg at 09/20/22 2111   docusate sodium (COLACE) capsule 100 mg  100 mg Oral BID Massengill, Harrold Donath, MD   100 mg at 09/20/22 1711   ferrous sulfate tablet 325 mg  325 mg Oral Daily Massengill, Harrold Donath, MD   325 mg at 09/20/22 0734   magnesium hydroxide (MILK OF MAGNESIA) suspension 30 mL  30 mL Oral Daily PRN Massengill, Harrold Donath, MD       metFORMIN (GLUCOPHAGE-XR) 24 hr tablet 500 mg  500 mg Oral Q breakfast Massengill, Harrold Donath, MD   500 mg at 09/20/22 0734   methylphenidate (RITALIN) tablet 10 mg  10 mg Oral Daily Massengill, Harrold Donath, MD   10 mg at 09/20/22 0737   polyethylene glycol (MIRALAX / GLYCOLAX) packet 17 g  17 g Oral Daily Massengill, Harrold Donath, MD   17 g at 09/20/22 0734   sertraline (ZOLOFT) tablet 100 mg  100 mg Oral Daily Massengill, Harrold Donath, MD   100 mg at 09/20/22 2426   vitamin D3 (CHOLECALCIFEROL) tablet 1,000 Units  1,000 Units Oral Daily Phineas Inches, MD   1,000 Units at 09/20/22 8341   Lab Results:  No results found for this or any previous visit (from the past 48 hour(s)).  Blood Alcohol level:  Lab Results  Component Value Date   ETH <10 10/16/2021   ETH <10 04/24/2021   Metabolic Labs: Lab Results  Component Value Date   HGBA1C 5.2 05/18/2022   MPG 102.54 05/18/2022   MPG 120 04/29/2021   Lab Results  Component Value Date   PROLACTIN 18.5 06/18/2022   PROLACTIN 58.3 (H) 06/06/2022   Lab Results  Component Value Date   CHOL 217 (H) 05/18/2022   TRIG 31 05/18/2022   HDL 71 05/18/2022   CHOLHDL 3.1 05/18/2022   VLDL 6 05/18/2022   LDLCALC 140 (H) 05/18/2022   LDLCALC 94 05/02/2021   Sleep:No data recorded  Physical Findings: AIMS: No  CIWA:    COWS:     Mental Status Exam:   Appearance and Grooming: Patient is casually dressed in gown over long sleeve blouse and leggings . The patient  has no noticeable scent or odor.   Behavior: The patient appears in no acute distress, and during the interview, was calm, focused, required minimal redirection, and behaving appropriately to scenario; she was able to follow commands and compliant to requests and made good eye contact.   Attitude: Patient was cooperative during the interview.   Motor activity: The patient's movement speed was normal; her gait was normal. There was no notable abnormal facial movements and no notable abnormal extremity movements.   Patient does not appear to be responding to external stimuli.   Speech: The patient's speech was clear, fluent, with good articulation, and with appropriately placed inflections. The volume of her speech was soft and normal in quantity. The rate was normal with a normal rhythm. Responses were normal in latency. There were no abnormal patterns in speech.   Mood: "I feel alright"   Affect: Patient's affect is euthymic and constricted with restricted range and even fluctuations; her affect is congruent with her stated mood. -------------------------------------------------------------------------------------------------------------------------   Thought Content The patient experiences no hallucinations.  The patient does not spontaneously discuss delusional thoughts, but when prompted, continues to express persecutory delusions about family members, stating "it's not safe" - likely fixed; she denies thought insertion, denies thought withdrawal, denies thought interruption, and denies thought broadcasting.   Patient denies active suicidal intent and denies passive suicidal ideation; she denies homicidal intent.   Thought Process The patient's thought process is linear and is concrete, not engaging in spontaneous conversation though responding to yes/no questions and short answers.   Insight The patient at the time of interview demonstrates poor insight, as evidenced by lacking  understanding of mental health condition/s, inability to identify trigger/s causing mental health decompensation, and inability to identify adaptive and maladaptive coping strategies.   Judgement The patient over the past 24 hours demonstrates fair judgement, as evidenced by adhering to medication regimen and minimally engaging with staff / other patients.  Assets  Assets: Manufacturing systems engineer; Physical Health; Resilience  Physical Exam: Constitutional:      Appearance: the patient is not toxic-appearing.  Pulmonary:     Effort: Pulmonary effort is normal.  Neurological:     General: No focal deficit present.     Mental Status: the patient is alert and oriented to person, place, and time.   Review of Systems  Respiratory:  Negative for shortness of breath.   Cardiovascular:  Negative for chest pain.  Gastrointestinal:  Negative for abdominal pain, constipation, diarrhea, nausea and vomiting.  Neurological:  Negative for headaches.   Blood pressure 124/89, pulse (!) 117, temperature 98.4 F (36.9 C), temperature source Oral, resp. rate 17, SpO2 100 %. There is no height or weight on file to calculate BMI.  Treatment Plan Summary: Daily contact with patient to assess and evaluate symptoms and progress in treatment and Medication management  Diagnoses / Active Problems: Schizoaffective disorder (HCC) Principal Problem:   Schizoaffective disorder (HCC) Active Problems:   GAD (generalized anxiety disorder)   Vitamin D deficiency   PLAN: Safety and Monitoring:  -- Voluntary admission to inpatient psychiatric unit for safety, stabilization and treatment  -- Daily contact with patient to assess and evaluate symptoms and progress in treatment  -- Patient's case to be discussed in multi-disciplinary team meeting  -- Observation Level : q15 minute checks  -- Vital signs:  q12 hours  -- Precautions: suicide, elopement, and assault  2. Medications:              -- Continue  Clozaril 200 qhs - for psychosis, CBC w/ differential wnl (10/17)  Targeted review of symptoms, specific for clozapine: Malaise/Sedation: denies Chest pain: denies Shortness of breath: denies Exertional capacity: denies Tachycardia: denies Cough: denies Sore Throat: denies Fever: denies Orthostatic hypotension (dizziness with standing): denies Hypersalivation: "improved" Constipation: denies Nausea: denies Nocturnal enuresis: denies  Symptoms of GERD -Acid reflux: denies -Burning sensation in chest, usually after eating: denies -Trouble swallowing: denies -Sensation of lump in throat: denies -Regurgitation (backwash) of food or sour liquid: denies  -- Continue atropine drops 1% 1 drop tid for sialorrhea.  -- Continue Ritalin 10 mg qam - for daytime sedation 2/2 clozapine and negative symptoms of schizophrenia. -- Continue Zoloft 100 mg daily for depression -- Continue Norvasc 10 mg daily for hypertension -- Continue atenolol 12.5 mg once daily - for tachycardia 2/2 clozapine  -- Continue Colace 100 mg 2 twice daily for constipation -- Continue Miralax daily for constipation -- Continue Ensure TID for nutritional support -- Continue Ferrous Sulfate 325 mg daily for iron replacement -- Continue Atarax  25 mg TID PRN for anxiety -- Continue Metformin 500 mg XL for weight gain prophylaxis               -- Previously discontinued 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  -- Previously discontinued geodon, caplyta, thorazine, zyprexa, risperdal due to no improvement (also concern for cheeking during previous antipsychotic medication trials)  -- Previously discontinued klonopin for anxiety associated with paranoid thoughts.  -- Completed - Diflucan 150 mg po daily for yeast infection. -- Completed - Megace for AUB   The risks/benefits/side-effects/alternatives to the above medication were discussed in detail with the patient and time was given for questions. The patient consents to medication trial.  FDA black box warnings, if present, were discussed.  The patient is agreeable with the medication plan, as above. We will monitor the patient's response to pharmacologic treatment, and adjust medications as necessary.  3. Routine and other pertinent labs:             -- Metabolic profile:  BMI: There is no height or weight on file to calculate BMI.  CBC    Component Value Date/Time   WBC 4.1 09/15/2022 0709   RBC 4.74 09/15/2022 0709   HGB 12.6 09/15/2022 0709   HGB 13.6 02/07/2020 1549   HCT 39.6 09/15/2022 0709   HCT 42.7 02/07/2020 1549   PLT 240 09/15/2022 0709   PLT 273 02/07/2020 1549   MCV 83.5 09/15/2022 0709   MCV 81 02/07/2020 1549   MCH 26.6 09/15/2022 0709   MCHC 31.8 09/15/2022 0709   RDW 14.2 09/15/2022 0709   RDW 13.4 02/07/2020 1549   LYMPHSABS 1.4 09/15/2022 0709   LYMPHSABS 1.9 12/05/2019 1530   MONOABS 0.3 09/15/2022 0709   EOSABS 0.2 09/15/2022 0709   EOSABS 0.3 12/05/2019 1530   BASOSABS 0.0 09/15/2022 0709   BASOSABS 0.0 12/05/2019 1530   Prolactin: Lab Results  Component Value Date   PROLACTIN 18.5 06/18/2022   PROLACTIN 58.3 (H) 06/06/2022    Lipid Panel: Lab Results  Component Value Date   CHOL 217 (H) 05/18/2022   TRIG 31 05/18/2022   HDL 71 05/18/2022   CHOLHDL 3.1 05/18/2022   VLDL 6 05/18/2022   LDLCALC 140 (H) 05/18/2022   LDLCALC 94 05/02/2021    HbgA1c: Hgb A1c MFr Bld (%)  Date Value  05/18/2022 5.2    TSH: TSH (uIU/mL)  Date Value  06/18/2022 0.964  02/26/2015 1.352    EKG monitoring: QTc: 450 (8/28)  Lab Results  Component Value Date   NEUTROABS 2.1 09/15/2022   4. Group Therapy:             -- Encouraged patient to participate in unit milieu and in scheduled group therapies              -- Short Term Goals: Ability to identify changes in lifestyle to reduce recurrence of condition will improve, Ability to verbalize feelings will improve, Ability to demonstrate self-control will improve, Ability to identify  and develop effective coping behaviors will improve, Ability to maintain clinical measurements within normal limits will improve, and Ability to identify triggers associated with substance abuse/mental health issues will improve             -- Long Term Goals: Improvement in symptoms so as ready for discharge -- Patient is encouraged to participate in group therapy while admitted to the psychiatric unit. -- We will address other chronic and acute stressors, which contributed to the patient's Schizoaffective disorder (HCC) in order  to reduce the risk of self-harm at discharge.  5. Discharge Planning:   -- Social work and case management to assist with discharge planning and identification of hospital follow-up needs prior to discharge            -- Estimated LOS: dc not before 09-29-22 per court  -- Discharge Concerns: Need to establish a safety plan; Medication compliance and effectiveness  -- Discharge Goals: Return home with outpatient referrals for mental health follow-up including medication management/psychotherapy  I certify that inpatient services furnished can reasonably be expected to improve the patient's condition.    Camelia Phenes, MD, 09/21/2022, 7:09 AM

## 2022-09-21 NOTE — B[facility] Group Notes (Signed)
Spiritual care group on grief and loss facilitated by chaplain Katy Azazel Franze, BCC  Group Goal:  Support / Education around grief and loss  Members engage in facilitated group support and psycho-social education.  Group Description:  Following introductions and group rules, group members engaged in facilitated group dialog and support around topic of loss, with particular support around experiences of loss in their lives. Group Identified types of loss (relationships / self / things) and identified patterns, circumstances, and changes that precipitate losses. Reflected on thoughts / feelings around loss, normalized grief responses, and recognized variety in grief experience. Group noted Worden's four tasks of grief in discussion.  Group drew on Adlerian / Rogerian, narrative, MI,  Patient Progress: Cheryl Adams attended group but did not participate.  Chaplain Katy Radwan Cowley, Bcc Pager, [phone] 

## 2022-09-21 NOTE — Group Note (Signed)
LCSW Group Therapy Note   Group Date: 09/21/2022 Start Time: 1300 End Time: 1400  Type of Therapy: Group Therapy: Boundaries  Participation: Active  Description of Group: This group will address the use of boundaries in their personal lives. Patients will explore why boundaries are important, the difference between healthy and unhealthy boundaries, and negative and postive outcomes of different boundaries and will look at how boundaries can be crossed.  Patients will be encouraged to identify current boundaries in their own lives and identify what kind of boundary is being set. Facilitators will guide patients in utilizing problem-solving interventions to address and correct types boundaries being used and to address when no boundary is being used. Understanding and applying boundaries will be explored and addressed for obtaining and maintaining a balanced life. Patients will be encouraged to explore ways to assertively make their boundaries and needs known to significant others in their lives, using other group members and facilitator for role play, support, and feedback.   Therapeutic Goals: 1. Patient will identify areas in their life where setting clear boundaries could be used to improve their life.  2. Patient will identify signs/triggers that a boundary is not being respected. 3. Patient will identify two ways to set boundaries in order to achieve balance in their lives: 4. Patient will demonstrate ability to communicate their needs and set boundaries through discussion and/or role plays  Summary of Progress/Problems: The Pt attended group and remained there the entire time.  The Pt accepted all worksheets and participated appropriately with their peers.  The Pt demonstrated understanding of the topic being discussed.  The Pt was able to discuss different types of boundaries and how they can begin to work on those boundaries after discharge.   Darleen Crocker, LCSWA 09/21/2022  2:15 PM

## 2022-09-21 NOTE — Progress Notes (Signed)
Adult Psychoeducational Group Note  Date:  09/21/2022 Time:  9:33 PM  Group Topic/Focus:  AA Group  Participation Level:  None  Participation Quality:   n/a  Affect:  Flat  Cognitive:   n/a  Insight: None  Engagement in Group:  None  Modes of Intervention:  Discussion, Education, and Support  Additional Comments:   Pt attended the [place] group. Pt did not engage or participate in the group. Pt sat with her head down and eyes closed  the majority of the group.  Cheryl Adams 09/21/2022, 9:33 PM

## 2022-09-22 DIAGNOSIS — F251 Schizoaffective disorder, depressive type: Secondary | ICD-10-CM | POA: Diagnosis not present

## 2022-09-22 LAB — CBC WITH DIFFERENTIAL/PLATELET
Abs Immature Granulocytes: 0.03 10*3/uL (ref 0.00–0.07)
Basophils Absolute: 0 10*3/uL (ref 0.0–0.1)
Basophils Relative: 1 %
Eosinophils Absolute: 0.1 10*3/uL (ref 0.0–0.5)
Eosinophils Relative: 2 %
HCT: 41.8 % (ref 36.0–46.0)
Hemoglobin: 13.1 g/dL (ref 12.0–15.0)
Immature Granulocytes: 1 %
Lymphocytes Relative: 29 %
Lymphs Abs: 1.7 10*3/uL (ref 0.7–4.0)
MCH: 26.3 pg (ref 26.0–34.0)
MCHC: 31.3 g/dL (ref 30.0–36.0)
MCV: 83.9 fL (ref 80.0–100.0)
Monocytes Absolute: 0.4 10*3/uL (ref 0.1–1.0)
Monocytes Relative: 6 %
Neutro Abs: 3.7 10*3/uL (ref 1.7–7.7)
Neutrophils Relative %: 61 %
Platelets: 271 10*3/uL (ref 150–400)
RBC: 4.98 MIL/uL (ref 3.87–5.11)
RDW: 14.2 % (ref 11.5–15.5)
WBC: 6 10*3/uL (ref 4.0–10.5)
nRBC: 0 % (ref 0.0–0.2)

## 2022-09-22 NOTE — B[facility] Group Notes (Signed)
[facility] Group Notes:  (Nursing/MHT/Case Management/Adjunct)  Date:  09/22/2022  Time:  10:27 AM  Type of Therapy:  Goals Group: The focus of this group is to help patients establish daily goals to achieve during treatment and discuss how the patient can incorporate goal setting into their daily lives to aide in recovery.  Participation Level:  Active  Participation Quality:  Appropriate  Affect:  Appropriate  Cognitive:  Appropriate  Insight:  Appropriate  Engagement in Group:  Engaged  Modes of Intervention:  Discussion  Summary of Progress/Problems:  Patient attended and participated in goals group today. Patient's goal for today is to focus on mediation today.   Elza Rafter 09/22/2022, 10:27 AM

## 2022-09-22 NOTE — Progress Notes (Signed)
TOC Director and Leadership team is met with [place]  Kevin Fenton this morning. Mr. Truman Hayward shared that the [facility] independent living program had declined the Ms. Gossett for admission due to the patient's inability to manage her own medication. Conversation was made regarding funding support (transportation to [facility] clinic and one on one) to make this placement safe; however per DSS, the facility declined based on the criteria that residents have to be solely responsible.  DSS is now exploring the '[facility] group home in [place], [place], but have to seek approval at the [place] level to pay for room and board at the facility.

## 2022-09-22 NOTE — Progress Notes (Signed)
   09/22/22 0000  Psych Admission Type (Psych Patients Only)  Admission Status Voluntary  Psychosocial Assessment  Patient Complaints Depression  Eye Contact Fair  Facial Expression Flat  Affect Appropriate to circumstance  Speech Logical/coherent  Interaction Childlike;Cautious  Motor Activity Slow  Appearance/Hygiene Body odor  Behavior Characteristics Cooperative  Mood Pleasant  Aggressive Behavior  Effect No apparent injury  Thought Process  Coherency WDL  Content WDL  Delusions WDL  Perception WDL  Hallucination None reported or observed  Judgment Limited  Confusion None  Danger to Self  Current suicidal ideation? Denies  Danger to Others  Danger to Others None reported or observed

## 2022-09-22 NOTE — Progress Notes (Signed)
[facility] BH Supervisor participated on 2x a week DSS call and received patient update. Possible placement in [place] supervisor Kevin Fenton is awaiting on funding determination should patient be accepted into the program in [place], [place] [facility]).

## 2022-09-22 NOTE — Progress Notes (Addendum)
[facility] MD Progress Note  09/22/2022 7:15 AM [name]  MRN:  [id number]  Principal Problem: Schizoaffective disorder (HCC) Diagnosis: Principal Problem:   Schizoaffective disorder (HCC) Active Problems:   GAD (generalized anxiety disorder)   Vitamin D deficiency   Reason for Admission:  Cheryl Adams is a 31 year old African-American female with prior diagnoses of schizoaffective d/o depressive type and GAD who initially presented to the [facility] urgent care [facility] accompanied by her mother with complaints of paranoia. Pt was initially admittedon 05-19-2022, for many months, and treated for depression and paranoia with multiple medication and ultimately had partial response to clozapine. DSS was granted legal gaurdianship. On 09-10-2022, pt was d/c for MDE evaluation per court. On 09-10-2022, the pt was  readmitted to the psych unit after the evaluation (admitted on 09/10/2022, total  LOS: 12 days )  Chart Review from last 24 hours:  The patient's chart was reviewed and nursing notes were reviewed. The patient's case was discussed in multidisciplinary team meeting.   - Patient received all scheduled medications - Patient did not receive any PRN medications  Information Obtained Today During Patient Interview: The patient was seen and evaluated on the unit. On assessment today the patient is at baseline and interview was largely unchanged from yesterday or the prior days. She responds appropriately to yes/no questions and gives short answers.  Patient does not spontaneously endorse persecutory delusions of family members but avoids discussing the topic and alludes to not feeling safe around them.   Patient endorses good sleep, good appetite.   Patient denies any side-effects attributable to medications. Patient denies any somatic complaints, denies feeling tired. Also continues to note that her sialorrhea is "improved," but says it has not completely  disappeared.  Past Psychiatric History:  Past Surgical History:  Procedure Laterality Date   TONSILLECTOMY AND ADENOIDECTOMY Bilateral 11/14/2014   Procedure: BILATERAL TONSILLECTOMY AND ADENOIDECTOMY;  Surgeon: Flo Shanks, MD;  Location: [facility];  Service: ENT;  Laterality: Bilateral;   TYMPANOSTOMY TUBE PLACEMENT     Past Medical History:  Past Medical History:  Diagnosis Date   Anemia    Chronic tonsillitis 10/2014   Cough 11/09/2014   Difficulty swallowing pills    No psychiatric disorder found after evaluation 04/14/2022   Family History: History reviewed. No pertinent family history. Family Psychiatric  History: Schizophrenia- Maternal grandfather.  Social History: Marital status: Single Are you sexually active?: No What is your sexual orientation?: Heterosexual Has your sexual activity been affected by drugs, alcohol, medication, or emotional stress?: No Does patient have children?: No   Current Medications: Current Facility-Administered Medications  Medication Dose Route Frequency Provider Last Rate Last Admin   acetaminophen (TYLENOL) tablet 650 mg  650 mg Oral Q6H PRN Massengill, Nathan, MD       alum & mag hydroxide-simeth (MAALOX/MYLANTA) 200-200-20 MG/5ML suspension 30 mL  30 mL Oral Q4H PRN Massengill, Nathan, MD       amLODipine (NORVASC) tablet 10 mg  10 mg Oral Daily Massengill, Nathan, MD   10 mg at 09/21/22 0816   antiseptic oral rinse (BIOTENE) solution 15 mL  15 mL Mouth Rinse 5 X Daily PRN Massengill, Harrold Donath, MD       atenolol (TENORMIN) tablet 12.5 mg  12.5 mg Oral Daily Massengill, Nathan, MD   12.5 mg at 09/21/22 0816   atropine 1 % ophthalmic solution 1 drop  1 drop Sublingual TID Phineas Inches, MD   1 drop at 09/21/22 1657  cloZAPine (CLOZARIL) tablet 200 mg  200 mg Oral QHS Massengill, Nathan, MD   200 mg at 09/21/22 2152   docusate sodium (COLACE) capsule 100 mg  100 mg Oral BID Massengill, Ovid Curd, MD   100 mg at 09/21/22  1657   ferrous sulfate tablet 325 mg  325 mg Oral Daily Massengill, Ovid Curd, MD   325 mg at 09/21/22 8250   magnesium hydroxide (MILK OF MAGNESIA) suspension 30 mL  30 mL Oral Daily PRN Massengill, Ovid Curd, MD       metFORMIN (GLUCOPHAGE-XR) 24 hr tablet 500 mg  500 mg Oral Q breakfast Massengill, Ovid Curd, MD   500 mg at 09/21/22 0816   methylphenidate (RITALIN) tablet 10 mg  10 mg Oral Daily Massengill, Ovid Curd, MD   10 mg at 09/21/22 0817   polyethylene glycol (MIRALAX / GLYCOLAX) packet 17 g  17 g Oral Daily Massengill, Ovid Curd, MD   17 g at 09/21/22 0818   sertraline (ZOLOFT) tablet 100 mg  100 mg Oral Daily Massengill, Ovid Curd, MD   100 mg at 09/21/22 5397   vitamin D3 (CHOLECALCIFEROL) tablet 1,000 Units  1,000 Units Oral Daily Janine Limbo, MD   1,000 Units at 09/21/22 6734   Lab Results:  No results found for this or any previous visit (from the past 48 hour(s)).  Blood Alcohol level:  Lab Results  Component Value Date   ETH <10 10/16/2021   ETH <10 19/37/9024   Metabolic Labs: Lab Results  Component Value Date   HGBA1C 5.2 05/18/2022   MPG 102.54 05/18/2022   MPG 120 04/29/2021   Lab Results  Component Value Date   PROLACTIN 18.5 06/18/2022   PROLACTIN 58.3 (H) 06/06/2022   Lab Results  Component Value Date   CHOL 217 (H) 05/18/2022   TRIG 31 05/18/2022   HDL 71 05/18/2022   CHOLHDL 3.1 05/18/2022   VLDL 6 05/18/2022   LDLCALC 140 (H) 05/18/2022   LDLCALC 94 05/02/2021   Sleep:No data recorded  Physical Findings: AIMS: No  CIWA:    COWS:     Mental Status Exam:   Appearance and Grooming: Patient is casually dressed in gown over long sleeve blouse and leggings . The patient has no noticeable scent or odor.   Behavior: The patient appears in no acute distress, and during the interview, was calm, focused, required minimal redirection, and behaving appropriately to scenario; she was able to follow commands and compliant to requests and made good eye  contact.   Attitude: Patient was cooperative during the interview.   Motor activity: The patient's movement speed was normal; her gait was normal. There was no notable abnormal facial movements and no notable abnormal extremity movements.   Patient does not appear to be responding to external stimuli.   Speech: The patient's speech was clear, fluent, with good articulation, and with appropriately placed inflections. The volume of her speech was soft and normal in quantity. The rate was normal with a normal rhythm. Responses were normal in latency. There were no abnormal patterns in speech.   Mood: "I feel alright"   Affect: Patient's affect is euthymic and constricted with restricted range and even fluctuations; her affect is congruent with her stated mood. -------------------------------------------------------------------------------------------------------------------------   Thought Content The patient experiences no hallucinations. The patient does not spontaneously discuss delusional thoughts, but when prompted, continues to express persecutory delusions about family members and not feeling safe around them or to even talk to them - likely fixed; she denies thought insertion, denies thought  withdrawal, denies thought interruption, and denies thought broadcasting.   Patient denies active suicidal intent and denies passive suicidal ideation; she denies homicidal intent.   Thought Process The patient's thought process is linear and is concrete, not engaging in spontaneous conversation though responding to yes/no questions and short answers.   Insight The patient at the time of interview demonstrates poor insight, as evidenced by lacking understanding of mental health condition/s, inability to identify trigger/s causing mental health decompensation, and inability to identify adaptive and maladaptive coping strategies.   Judgement The patient over the past 24 hours demonstrates fair  judgement, as evidenced by adhering to medication regimen and minimally engaging with staff / other patients.  Assets  Assets: Manufacturing systems engineer; Physical Health; Resilience  Physical Exam: Constitutional:      Appearance: the patient is not toxic-appearing.  Pulmonary:     Effort: Pulmonary effort is normal.  Neurological:     General: No focal deficit present.     Mental Status: the patient is alert and oriented to person, place, and time.   Review of Systems  Respiratory:  Negative for shortness of breath.   Cardiovascular:  Negative for chest pain.  Gastrointestinal:  Negative for abdominal pain, constipation, diarrhea, nausea and vomiting.  Neurological:  Negative for headaches.   Blood pressure (!) 92/58, pulse 100, temperature 98.4 F (36.9 C), temperature source Oral, resp. rate 18, SpO2 100 %. There is no height or weight on file to calculate BMI.  Treatment Plan Summary: Daily contact with patient to assess and evaluate symptoms and progress in treatment and Medication management  Diagnoses / Active Problems: Schizoaffective disorder (HCC) Principal Problem:   Schizoaffective disorder (HCC) Active Problems:   GAD (generalized anxiety disorder)   Vitamin D deficiency   PLAN: Safety and Monitoring:  -- Voluntary admission to inpatient psychiatric unit for safety, stabilization and treatment  -- Daily contact with patient to assess and evaluate symptoms and progress in treatment  -- Patient's case to be discussed in multi-disciplinary team meeting  -- Observation Level : q15 minute checks  -- Vital signs:  q12 hours  -- Precautions: suicide, elopement, and assault  2. Medications:              -- Continue Clozaril 200 qhs - for psychosis, CBC w/ differential wnl (10/17) and redrawn today (09/22/2022), recheck every 7 days  Targeted review of symptoms, specific for clozapine: Malaise/Sedation: denies Chest pain: denies Shortness of breath:  denies Exertional capacity: denies Tachycardia: denies Cough: denies Sore Throat: denies Fever: denies Orthostatic hypotension (dizziness with standing): denies Hypersalivation: "improved" Constipation: denies Nausea: denies Nocturnal enuresis: denies  Symptoms of GERD -Acid reflux: denies -Burning sensation in chest, usually after eating: denies -Trouble swallowing: denies -Sensation of lump in throat: denies -Regurgitation (backwash) of food or sour liquid: denies  -- Continue atropine drops 1% 1 drop tid for sialorrhea.  -- Continue Ritalin 10 mg qam - for daytime sedation 2/2 clozapine and negative symptoms of schizophrenia. -- Continue Zoloft 100 mg daily for depression -- Continue Norvasc 10 mg daily for hypertension -- Continue atenolol 12.5 mg once daily - for tachycardia 2/2 clozapine  -- Continue Colace 100 mg 2 twice daily for constipation -- Continue Miralax daily for constipation -- Continue Ensure TID for nutritional support -- Continue Ferrous Sulfate 325 mg daily for iron replacement -- Continue Atarax 25 mg TID PRN for anxiety -- Continue Metformin 500 mg XL for weight gain prophylaxis               --  Previously discontinued 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  -- Previously discontinued geodon, caplyta, thorazine, zyprexa, risperdal due to no improvement (also concern for cheeking during previous antipsychotic medication trials)  -- Previously discontinued klonopin for anxiety associated with paranoid thoughts.  -- Completed - Diflucan 150 mg po daily for yeast infection. -- Completed - Megace for AUB   The risks/benefits/side-effects/alternatives to the above medication were discussed in detail with the patient and time was given for questions. The patient consents to medication trial. FDA black box warnings, if present, were discussed.  The patient is agreeable with the medication plan, as above. We will monitor the patient's response to pharmacologic treatment, and  adjust medications as necessary.  3. Routine and other pertinent labs:             -- Metabolic profile:  BMI: There is no height or weight on file to calculate BMI.  CBC    Component Value Date/Time   WBC 4.1 09/15/2022 0709   RBC 4.74 09/15/2022 0709   HGB 12.6 09/15/2022 0709   HGB 13.6 02/07/2020 1549   HCT 39.6 09/15/2022 0709   HCT 42.7 02/07/2020 1549   PLT 240 09/15/2022 0709   PLT 273 02/07/2020 1549   MCV 83.5 09/15/2022 0709   MCV 81 02/07/2020 1549   MCH 26.6 09/15/2022 0709   MCHC 31.8 09/15/2022 0709   RDW 14.2 09/15/2022 0709   RDW 13.4 02/07/2020 1549   LYMPHSABS 1.4 09/15/2022 0709   LYMPHSABS 1.9 12/05/2019 1530   MONOABS 0.3 09/15/2022 0709   EOSABS 0.2 09/15/2022 0709   EOSABS 0.3 12/05/2019 1530   BASOSABS 0.0 09/15/2022 0709   BASOSABS 0.0 12/05/2019 1530   Prolactin: Lab Results  Component Value Date   PROLACTIN 18.5 06/18/2022   PROLACTIN 58.3 (H) 06/06/2022    Lipid Panel: Lab Results  Component Value Date   CHOL 217 (H) 05/18/2022   TRIG 31 05/18/2022   HDL 71 05/18/2022   CHOLHDL 3.1 05/18/2022   VLDL 6 05/18/2022   LDLCALC 140 (H) 05/18/2022   LDLCALC 94 05/02/2021    HbgA1c: Hgb A1c MFr Bld (%)  Date Value  05/18/2022 5.2    TSH: TSH (uIU/mL)  Date Value  06/18/2022 0.964  02/26/2015 1.352    EKG monitoring: QTc: 450 (8/28)  Repeat CBC w/ diff ordered to monitor for neutropenia: results pending  Lab Results  Component Value Date   NEUTROABS 2.1 09/15/2022   4. Group Therapy:             -- Encouraged patient to participate in unit milieu and in scheduled group therapies              -- Short Term Goals: Ability to identify changes in lifestyle to reduce recurrence of condition will improve, Ability to verbalize feelings will improve, Ability to demonstrate self-control will improve, Ability to identify and develop effective coping behaviors will improve, Ability to maintain clinical measurements within normal  limits will improve, and Ability to identify triggers associated with substance abuse/mental health issues will improve             -- Long Term Goals: Improvement in symptoms so as ready for discharge -- Patient is encouraged to participate in group therapy while admitted to the psychiatric unit. -- We will address other chronic and acute stressors, which contributed to the patient's Schizoaffective disorder (HCC) in order to reduce the risk of self-harm at discharge.  5. Discharge Planning:   -- Social work and case  management to assist with discharge planning and identification of hospital follow-up needs prior to discharge            -- Estimated LOS: dc not before 09-29-22 per court  -- Discharge Concerns: Need to establish a safety plan; Medication compliance and effectiveness  -- Discharge Goals: Return home with outpatient referrals for mental health follow-up including medication management/psychotherapy  I certify that inpatient services furnished can reasonably be expected to improve the patient's condition.    Augusto Gambleaniel Shinichi Anguiano, MD, 09/22/2022, 7:15 AM

## 2022-09-22 NOTE — Progress Notes (Signed)
   09/22/22 0800  Psych Admission Type (Psych Patients Only)  Admission Status Voluntary  Psychosocial Assessment  Patient Complaints Depression  Eye Contact Fair  Facial Expression Flat  Affect Appropriate to circumstance  Speech Logical/coherent  Interaction Childlike;Cautious  Motor Activity Slow  Appearance/Hygiene Body odor  Behavior Characteristics Cooperative;Appropriate to situation  Mood Pleasant;Euthymic  Aggressive Behavior  Effect No apparent injury  Thought Process  Coherency WDL  Content WDL  Delusions WDL  Perception WDL  Hallucination None reported or observed  Judgment Limited  Confusion None  Danger to Self  Current suicidal ideation? Denies  Self-Injurious Behavior No self-injurious ideation or behavior indicators observed or expressed   Agreement Not to Harm Self Yes  Description of Agreement Verbal  Danger to Others  Danger to Others None reported or observed

## 2022-09-22 NOTE — Progress Notes (Signed)
Adult Psychoeducational Group Note  Date:  09/22/2022 Time:  9:44 PM  Group Topic/Focus:  Wrap-Up Group:   The focus of this group is to help patients review their daily goal of treatment and discuss progress on daily workbooks.  Participation Level:  Minimal  Participation Quality:  Appropriate  Affect:  Flat  Cognitive:  Appropriate  Insight: Appropriate  Engagement in Group:  Engaged  Modes of Intervention:  Discussion and Support  Additional Comments:   Pt attended and participated in the [place] group. Pt denied SI/HI/AVH. Pt rated her day 4/10. Pt was able to achieve her goal of meditation. Pt reports that she practice meditation 1x today. Pt plans to continue meditating to improve her mood and  daily coping.  [name] Cleave Ternes 09/22/2022, 9:44 PM

## 2022-09-22 NOTE — Plan of Care (Signed)

## 2022-09-23 DIAGNOSIS — F251 Schizoaffective disorder, depressive type: Secondary | ICD-10-CM | POA: Diagnosis not present

## 2022-09-23 NOTE — Progress Notes (Signed)
Adult Psychoeducational Group Note  Date:  09/23/2022 Time:  9:44 PM  Group Topic/Focus:  Wrap-Up Group:   The focus of this group is to help patients review their daily goal of treatment and discuss progress on daily workbooks.  Participation Level:  Active  Participation Quality:  Appropriate  Affect:  Appropriate  Cognitive:  Appropriate  Insight: Appropriate  Engagement in Group:  Engaged  Modes of Intervention:  Education  Additional Comments:  Pt stated she had a good day.  Pt goal was to be able to meditate. Pt met goal.  Elise Benne 09/23/2022, 9:44 PM

## 2022-09-23 NOTE — Plan of Care (Signed)
  Problem: Education: Goal: Emotional status will improve Outcome: Progressing Goal: Mental status will improve Outcome: Progressing   Problem: Activity: Goal: Interest or engagement in activities will improve Outcome: Progressing Goal: Sleeping patterns will improve Outcome: Progressing   

## 2022-09-23 NOTE — Group Note (Signed)
LCSW Group Therapy Note   Group Date: 09/23/2022 Start Time: 1300 End Time: 1400  Type of Therapy and Topic: Group Therapy: Effective Communication   Participation Level: Minimal    Description of Group:  In this group patients will be asked to identify their own styles of communication as well as defining and identifying passive, assertive, and aggressive styles of communication. Participants will identify strategies to communicate in a more assertive manner in an effort to appropriately meet their needs. This group will be process-oriented, with patients participating in exploration of their own experiences as well as giving and receiving support and challenge from other group members.   Therapeutic Goals: 1. Patient will identify their personal communication style. 2. Patient will identify passive, assertive, and aggressive forms of communication. 3. Patient will identify strategies for developing more effective communication to appropriately meet their needs.    Summary of Patient Progress: The Pt attended group and remained there the entire time.  The Pt accepted all worksheets and followed along throughout the group.  The Pt did not participate in the open discussion but was appropriate with their peers.      Therapeutic Modalities:  Communication Skills Solution Focused Therapy Motivational Interviewing  Darleen Crocker, [place] 09/23/2022  2:04 PM

## 2022-09-23 NOTE — Progress Notes (Signed)
Adult Psychoeducational Group Note  Date:  09/23/2022 Time:  10:10 AM  Group Topic/Focus:  Goals Group:   The focus of this group is to help patients establish daily goals to achieve during treatment and discuss how the patient can incorporate goal setting into their daily lives to aide in recovery.  Participation Level:  Active  Participation Quality:  Appropriate  Affect:  Appropriate  Cognitive:  Appropriate  Insight: Appropriate  Engagement in Group:  Engaged  Modes of Intervention:  Discussion  Additional Comments:  Patient attended morning orientation/goals group and participated.   Cheryl Adams 83/66/2947, 10:10 AM

## 2022-09-23 NOTE — [facility] Group Notes (Signed)
Pt attended group 

## 2022-09-23 NOTE — Progress Notes (Addendum)
[facility] MD Progress Note  09/23/2022 7:07 AM ALMINA SCHUL  MRN:  [id number]  Principal Problem: Schizoaffective disorder (HCC) Diagnosis: Principal Problem:   Schizoaffective disorder (HCC) Active Problems:   GAD (generalized anxiety disorder)   Vitamin D deficiency   Reason for Admission:  Levina Boyack is a 31 year old African-American female with prior diagnoses of schizoaffective d/o depressive type and GAD who initially presented to the [facility] health urgent care [facility]) accompanied by her mother with complaints of paranoia. Pt was initially admittedon 05-19-2022, for many months, and treated for depression and paranoia with multiple medication and ultimately had partial response to clozapine. DSS was granted legal gaurdianship. On 09-10-2022, pt was d/c for MDE evaluation per court. On 09-10-2022, the pt was  readmitted to the psych unit after the evaluation (admitted on 09/10/2022, total  LOS: 13 days )  Chart Review from last 24 hours:  The patient's chart was reviewed and nursing notes were reviewed. The patient's case was discussed in multidisciplinary team meeting.   - Patient received all scheduled medications - Patient did not receive any PRN medications  Information Obtained Today During Patient Interview: The patient was seen and evaluated on the unit. On assessment today the patient is at baseline and interview was largely unchanged from yesterday or the prior days. She responds appropriately to yes/no questions and gives short answers.  Patient does not spontaneously endorse persecutory delusions of family members but avoids discussing the topic and alludes to not feeling safe around them.   Patient endorses good sleep, good appetite.   Patient denies any side-effects attributable to medications. Patient denies any somatic complaints, denies feeling tired. Also continues to note that her sialorrhea is "improved," but says it has not completely disappeared.  Today, she also notes feeling "a little tired."  Past Psychiatric History:  Past Surgical History:  Procedure Laterality Date   TONSILLECTOMY AND ADENOIDECTOMY Bilateral 11/14/2014   Procedure: BILATERAL TONSILLECTOMY AND ADENOIDECTOMY;  Surgeon: Flo Shanks, MD;  Location: [facility];  Service: ENT;  Laterality: Bilateral;   TYMPANOSTOMY TUBE PLACEMENT     Past Medical History:  Past Medical History:  Diagnosis Date   Anemia    Chronic tonsillitis 10/2014   Cough 11/09/2014   Difficulty swallowing pills    No psychiatric disorder found after evaluation 04/14/2022   Family History: History reviewed. No pertinent family history. Family Psychiatric  History: Schizophrenia- Maternal grandfather.  Social History: Marital status: Single Are you sexually active?: No What is your sexual orientation?: Heterosexual Has your sexual activity been affected by drugs, alcohol, medication, or emotional stress?: No Does patient have children?: No   Current Medications: Current Facility-Administered Medications  Medication Dose Route Frequency Provider Last Rate Last Admin   acetaminophen (TYLENOL) tablet 650 mg  650 mg Oral Q6H PRN Massengill, Nathan, MD       alum & mag hydroxide-simeth (MAALOX/MYLANTA) 200-200-20 MG/5ML suspension 30 mL  30 mL Oral Q4H PRN Massengill, Nathan, MD       amLODipine (NORVASC) tablet 10 mg  10 mg Oral Daily Massengill, Nathan, MD   10 mg at 09/22/22 0820   antiseptic oral rinse (BIOTENE) solution 15 mL  15 mL Mouth Rinse 5 X Daily PRN Massengill, Harrold Donath, MD       atenolol (TENORMIN) tablet 12.5 mg  12.5 mg Oral Daily Massengill, Nathan, MD   12.5 mg at 09/22/22 0823   atropine 1 % ophthalmic solution 1 drop  1 drop Sublingual TID Phineas Inches, MD  1 drop at 09/22/22 1719   cloZAPine (CLOZARIL) tablet 200 mg  200 mg Oral QHS Massengill, Harrold Donath, MD   200 mg at 09/22/22 2204   docusate sodium (COLACE) capsule 100 mg  100 mg Oral BID Massengill,  Harrold Donath, MD   100 mg at 09/22/22 1719   ferrous sulfate tablet 325 mg  325 mg Oral Daily Massengill, Harrold Donath, MD   325 mg at 09/22/22 0820   magnesium hydroxide (MILK OF MAGNESIA) suspension 30 mL  30 mL Oral Daily PRN Massengill, Harrold Donath, MD       metFORMIN (GLUCOPHAGE-XR) 24 hr tablet 500 mg  500 mg Oral Q breakfast Massengill, Harrold Donath, MD   500 mg at 09/22/22 3086   methylphenidate (RITALIN) tablet 10 mg  10 mg Oral Daily Massengill, Harrold Donath, MD   10 mg at 09/22/22 0819   polyethylene glycol (MIRALAX / GLYCOLAX) packet 17 g  17 g Oral Daily Massengill, Harrold Donath, MD   17 g at 09/22/22 0817   sertraline (ZOLOFT) tablet 100 mg  100 mg Oral Daily Massengill, Harrold Donath, MD   100 mg at 09/22/22 5784   vitamin D3 (CHOLECALCIFEROL) tablet 1,000 Units  1,000 Units Oral Daily Massengill, Harrold Donath, MD   1,000 Units at 09/22/22 6962   Lab Results:  Results for orders placed or performed during the hospital encounter of 09/10/22 (from the past 48 hour(s))  CBC with Differential/Platelet     Status: None   Collection Time: 09/22/22  6:11 PM  Result Value Ref Range   WBC 6.0 4.0 - 10.5 K/uL   RBC 4.98 3.87 - 5.11 MIL/uL   Hemoglobin 13.1 12.0 - 15.0 g/dL   HCT 95.2 84.1 - 32.4 %   MCV 83.9 80.0 - 100.0 fL   MCH 26.3 26.0 - 34.0 pg   MCHC 31.3 30.0 - 36.0 g/dL   RDW 40.1 02.7 - 25.3 %   Platelets 271 150 - 400 K/uL   nRBC 0.0 0.0 - 0.2 %   Neutrophils Relative % 61 %   Neutro Abs 3.7 1.7 - 7.7 K/uL   Lymphocytes Relative 29 %   Lymphs Abs 1.7 0.7 - 4.0 K/uL   Monocytes Relative 6 %   Monocytes Absolute 0.4 0.1 - 1.0 K/uL   Eosinophils Relative 2 %   Eosinophils Absolute 0.1 0.0 - 0.5 K/uL   Basophils Relative 1 %   Basophils Absolute 0.0 0.0 - 0.1 K/uL   Immature Granulocytes 1 %   Abs Immature Granulocytes 0.03 0.00 - 0.07 K/uL    Comment: Performed at [facility], [address], [place], [place] [address]    Blood Alcohol level:  Lab Results  Component Value Date   [facility] <10  10/16/2021   ETH <10 04/24/2021   Metabolic Labs: Lab Results  Component Value Date   HGBA1C 5.2 05/18/2022   MPG 102.54 05/18/2022   MPG 120 04/29/2021   Lab Results  Component Value Date   PROLACTIN 18.5 06/18/2022   PROLACTIN 58.3 (H) 06/06/2022   Lab Results  Component Value Date   CHOL 217 (H) 05/18/2022   TRIG 31 05/18/2022   HDL 71 05/18/2022   CHOLHDL 3.1 05/18/2022   VLDL 6 05/18/2022   LDLCALC 140 (H) 05/18/2022   LDLCALC 94 05/02/2021   Sleep:No data recorded  Physical Findings: AIMS: No  CIWA:    COWS:     Mental Status Exam:   Appearance and Grooming: Patient is casually dressed in gown over long sleeve blouse and leggings .  The patient has no noticeable scent or odor.   Behavior: The patient appears in no acute distress, and during the interview, was calm, focused, required minimal redirection, and behaving appropriately to scenario; she was able to follow commands and compliant to requests and made good eye contact.   Attitude: Patient was cooperative during the interview.   Motor activity: The patient's movement speed was normal; her gait was normal. There was no notable abnormal facial movements and no notable abnormal extremity movements.   Patient does not appear to be responding to external stimuli.   Speech: The patient's speech was clear, fluent, with good articulation, and with appropriately placed inflections. The volume of her speech was soft and normal in quantity. The rate was normal with a normal rhythm. Responses were normal in latency. There were no abnormal patterns in speech.   Mood: "I feel alright"   Affect: Patient's affect is euthymic and constricted with restricted range and even fluctuations; her affect is congruent with her stated mood. -------------------------------------------------------------------------------------------------------------------------   Thought Content The patient experiences no hallucinations.  The patient does not spontaneously discuss delusional thoughts, but when prompted, continues to express persecutory delusions about family members and not feeling safe around them or to even talk to them - likely fixed; she denies thought insertion, denies thought withdrawal, denies thought interruption, and denies thought broadcasting.   Patient denies active suicidal intent and denies passive suicidal ideation; she denies homicidal intent.   Thought Process The patient's thought process is linear and is concrete, not engaging in spontaneous conversation though responding to yes/no questions and short answers.   Insight The patient at the time of interview demonstrates poor insight, as evidenced by lacking understanding of mental health condition/s, inability to identify trigger/s causing mental health decompensation, and inability to identify adaptive and maladaptive coping strategies.   Judgement The patient over the past 24 hours demonstrates fair judgement, as evidenced by adhering to medication regimen and minimally engaging with staff / other patients.  Assets  Assets: Armed forces logistics/support/administrative officer; Physical Health; Resilience  Physical Exam: Constitutional:      Appearance: the patient is not toxic-appearing.  Pulmonary:     Effort: Pulmonary effort is normal.  Neurological:     General: No focal deficit present.     Mental Status: the patient is alert and oriented to person, place, and time.   Review of Systems  Respiratory:  Negative for shortness of breath.   Cardiovascular:  Negative for chest pain.  Gastrointestinal:  Negative for abdominal pain, constipation, diarrhea, nausea and vomiting.  Neurological:  Negative for headaches.   Blood pressure 122/72, pulse 78, temperature 98.4 F (36.9 C), temperature source Oral, resp. rate 18, SpO2 100 %. There is no height or weight on file to calculate BMI.  Treatment Plan Summary: Daily contact with patient to assess and evaluate  symptoms and progress in treatment and Medication management  Diagnoses / Active Problems: Schizoaffective disorder ([place]) Principal Problem:   Schizoaffective disorder ([place]) Active Problems:   GAD (generalized anxiety disorder)   Vitamin D deficiency   PLAN: Safety and Monitoring:  -- Voluntary admission to inpatient psychiatric unit for safety, stabilization and treatment  -- Daily contact with patient to assess and evaluate symptoms and progress in treatment  -- Patient's case to be discussed in multi-disciplinary team meeting  -- Observation Level : q15 minute checks  -- Vital signs:  q12 hours  -- Precautions: suicide, elopement, and assault  2. Medications:              --  Continue Clozaril 200 qhs - for psychosis, ANC wnl (10/24 - REMS updated), recheck every 7 days  Targeted review of symptoms, specific for clozapine: Malaise/Sedation: endorses feeling "a little tired" Chest pain: denies Shortness of breath: denies Exertional capacity: denies Tachycardia: denies Cough: denies Sore Throat: denies Fever: denies Orthostatic hypotension (dizziness with standing): denies Hypersalivation: "improved" Constipation: denies Nausea: denies Nocturnal enuresis: denies  Symptoms of GERD -Acid reflux: denies -Burning sensation in chest, usually after eating: denies -Trouble swallowing: denies -Sensation of lump in throat: denies -Regurgitation (backwash) of food or sour liquid: denies  -- Continue atropine drops 1% 1 drop tid for sialorrhea.  -- Continue Ritalin 10 mg qam - for daytime sedation 2/2 clozapine and negative symptoms of schizophrenia. -- Continue Zoloft 100 mg daily for depression -- Continue Norvasc 10 mg daily for hypertension -- Continue atenolol 12.5 mg once daily - for tachycardia 2/2 clozapine  -- Continue Colace 100 mg 2 twice daily for constipation -- Continue Miralax daily for constipation -- Continue Ensure TID for nutritional support -- Continue  Ferrous Sulfate 325 mg daily for iron replacement -- Continue Atarax 25 mg TID PRN for anxiety -- Continue Metformin 500 mg XL for weight gain prophylaxis               -- Previously discontinued 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  -- Previously discontinued geodon, caplyta, thorazine, zyprexa, risperdal due to no improvement (also concern for cheeking during previous antipsychotic medication trials)  -- Previously discontinued klonopin for anxiety associated with paranoid thoughts.  -- Completed - Diflucan 150 mg po daily for yeast infection. -- Completed - Megace for AUB   The risks/benefits/side-effects/alternatives to the above medication were discussed in detail with the patient and time was given for questions. The patient consents to medication trial. FDA black box warnings, if present, were discussed.  The patient is agreeable with the medication plan, as above. We will monitor the patient's response to pharmacologic treatment, and adjust medications as necessary.  3. Routine and other pertinent labs:             -- Metabolic profile:  BMI: There is no height or weight on file to calculate BMI.  CBC    Component Value Date/Time   WBC 6.0 09/22/2022 1811   RBC 4.98 09/22/2022 1811   HGB 13.1 09/22/2022 1811   HGB 13.6 02/07/2020 1549   HCT 41.8 09/22/2022 1811   HCT 42.7 02/07/2020 1549   PLT 271 09/22/2022 1811   PLT 273 02/07/2020 1549   MCV 83.9 09/22/2022 1811   MCV 81 02/07/2020 1549   MCH 26.3 09/22/2022 1811   MCHC 31.3 09/22/2022 1811   RDW 14.2 09/22/2022 1811   RDW 13.4 02/07/2020 1549   LYMPHSABS 1.7 09/22/2022 1811   LYMPHSABS 1.9 12/05/2019 1530   MONOABS 0.4 09/22/2022 1811   EOSABS 0.1 09/22/2022 1811   EOSABS 0.3 12/05/2019 1530   BASOSABS 0.0 09/22/2022 1811   BASOSABS 0.0 12/05/2019 1530   Prolactin: Lab Results  Component Value Date   PROLACTIN 18.5 06/18/2022   PROLACTIN 58.3 (H) 06/06/2022    Lipid Panel: Lab Results  Component Value Date   CHOL  217 (H) 05/18/2022   TRIG 31 05/18/2022   HDL 71 05/18/2022   CHOLHDL 3.1 05/18/2022   VLDL 6 05/18/2022   LDLCALC 140 (H) 05/18/2022   LDLCALC 94 05/02/2021    HbgA1c: Hgb A1c MFr Bld (%)  Date Value  05/18/2022 5.2    TSH: TSH (uIU/mL)  Date Value  06/18/2022 0.964  02/26/2015 1.352    EKG monitoring: QTc: 450 (8/28)  Lab Results  Component Value Date   NEUTROABS 3.7 09/22/2022   4. Group Therapy:             -- Encouraged patient to participate in unit milieu and in scheduled group therapies              -- Short Term Goals: Ability to identify changes in lifestyle to reduce recurrence of condition will improve, Ability to verbalize feelings will improve, Ability to demonstrate self-control will improve, Ability to identify and develop effective coping behaviors will improve, Ability to maintain clinical measurements within normal limits will improve, and Ability to identify triggers associated with substance abuse/mental health issues will improve             -- Long Term Goals: Improvement in symptoms so as ready for discharge -- Patient is encouraged to participate in group therapy while admitted to the psychiatric unit. -- We will address other chronic and acute stressors, which contributed to the patient's Schizoaffective disorder (HCC) in order to reduce the risk of self-harm at discharge.  5. Discharge Planning:   -- Social work and case management to assist with discharge planning and identification of hospital follow-up needs prior to discharge            -- Estimated LOS: dc not before 09-29-22 per court  -- Discharge Concerns: Need to establish a safety plan; Medication compliance and effectiveness  -- Discharge Goals: Return home with outpatient referrals for mental health follow-up including medication management/psychotherapy  I certify that inpatient services furnished can reasonably be expected to improve the patient's condition.    Augusto Gambleaniel Evaluna Utke,  MD, 09/23/2022, 7:07 AM

## 2022-09-23 NOTE — Progress Notes (Signed)
D- Patient alert and oriented. Patient pleasant and cooperative. Denies SI, HI, AVH, and pain. Patient rates depression as a 4/10 and anxiety as a 4/10.   A-Scheduled medications administered to patient, per MD orders. Support and encouragement provided.  Routine safety checks conducted every 15 minutes.  Patient informed to notify staff with problems or concerns.  R- No adverse drug reactions noted. Patient contracts for safety at this time. Patient compliant with medications and treatment plan. Patient receptive, calm, and cooperative. Patient interacts well with others on the unit.  Patient remains safe at this time.

## 2022-09-23 NOTE — Progress Notes (Signed)
Pt on unit today, attended group. Minimal interaction, does cooperate and participate when prompted. Pt reports adequate sleep and appetite. Denies pain and constipation. Pt denies SI/HI/self harm thoughts as well as a/v hallucinations. Medication compliant. Q 15 minute checks ongoing for safety.

## 2022-09-23 NOTE — Progress Notes (Signed)
   09/23/22 0546  Sleep  Number of Hours 6

## 2022-09-24 ENCOUNTER — Encounter (HOSPITAL_COMMUNITY): Payer: Self-pay | Admitting: Psychiatry

## 2022-09-24 DIAGNOSIS — F251 Schizoaffective disorder, depressive type: Secondary | ICD-10-CM | POA: Diagnosis not present

## 2022-09-24 MED ORDER — CLOZAPINE 25 MG PO TABS
175.0000 mg | ORAL_TABLET | Freq: Every day | ORAL | Status: DC
  Administered 2022-09-24 – 2022-09-26 (×3): 175 mg via ORAL
  Filled 2022-09-24 (×5): qty 3

## 2022-09-24 NOTE — Progress Notes (Signed)
   09/24/22 2015  Psych Admission Type (Psych Patients Only)  Admission Status Voluntary  Psychosocial Assessment  Patient Complaints None  Eye Contact Fair  Facial Expression Flat  Affect Appropriate to circumstance  Speech Logical/coherent  Interaction Childlike;Minimal  Motor Activity Slow  Appearance/Hygiene Disheveled  Behavior Characteristics Cooperative  Mood Pleasant  Aggressive Behavior  Effect No apparent injury  Thought Process  Coherency WDL  Content WDL  Delusions WDL  Perception WDL  Hallucination None reported or observed  Judgment Impaired  Confusion None  Danger to Self  Current suicidal ideation? Denies  Danger to Others  Danger to Others None reported or observed

## 2022-09-24 NOTE — Progress Notes (Signed)
D- Patient alert and oriented. Denies SI, HI, AVH, and pain. Patient rates anxiety and depression at a 4/10 for each. Patient stated that the best part of her day was spending time outdoors.  A- Scheduled medications administered to patient, per MD orders. Support and encouragement provided.  Routine safety checks conducted every 15 minutes.  Patient informed to notify staff with problems or concerns.  R- No adverse drug reactions noted. Patient contracts for safety at this time. Patient compliant with medications and treatment plan. Patient receptive, calm, and cooperative. Patient interacts well with others on the unit.  Patient remains safe at this time.

## 2022-09-24 NOTE — Plan of Care (Signed)
  Problem: Education: Goal: Emotional status will improve Outcome: Progressing Goal: Mental status will improve Outcome: Progressing   

## 2022-09-24 NOTE — Group Note (Signed)
Date:  09/24/2022 Time:  10:56 AM  Group Topic/Focus:  Goals Group:   The focus of this group is to help patients establish daily goals to achieve during treatment and discuss how the patient can incorporate goal setting into their daily lives to aide in recovery.    Participation Level:  Active  Participation Quality:  Appropriate  Affect:  Appropriate  Cognitive:  Appropriate  Insight: Appropriate  Engagement in Group:  Engaged  Modes of Intervention:  Discussion  Additional Comments:     Cheryl Adams 09/24/2022, 10:56 AM

## 2022-09-24 NOTE — Progress Notes (Signed)
   09/24/22 0538  Sleep  Number of Hours 6.75

## 2022-09-24 NOTE — Progress Notes (Addendum)
[facility] MD Progress Note  09/24/2022 7:13 AM Cheryl Adams  MRN:  [id number]  Principal Problem: Schizoaffective disorder ([place]) Diagnosis: Principal Problem:   Schizoaffective disorder ([place]) Active Problems:   GAD (generalized anxiety disorder)   Vitamin D deficiency   Reason for Admission:  Cheryl Adams is a 31 year old African-American female with prior diagnoses of schizoaffective d/o depressive type and GAD who initially presented to the [facility] health urgent care [facility]) accompanied by her mother with complaints of paranoia. Pt was initially admittedon 05-19-2022, for many months, and treated for depression and paranoia with multiple medication and ultimately had partial response to clozapine. DSS was granted legal gaurdianship. On 09-10-2022, pt was d/c for MDE evaluation per court. On 09-10-2022, the pt was  readmitted to the psych unit after the evaluation (admitted on 09/10/2022, total  LOS: 14 days )  Chart Review from last 24 hours:  The patient's chart was reviewed and nursing notes were reviewed. The patient's case was discussed in multidisciplinary team meeting.   - Patient received all scheduled medications - Patient did not receive any PRN medications  Information Obtained Today During Patient Interview: The patient was seen and evaluated on the unit. On assessment today the patient is at baseline and interview was largely unchanged from yesterday or the prior days. She responds appropriately to yes/no questions and gives short answers.  Patient does not spontaneously endorse persecutory delusions of family members but avoids discussing the topic and alludes to not feeling safe around them.   Patient endorses good sleep, good appetite.   Patient denies any side-effects attributable to medications. Patient denies any somatic complaints, denies feeling tired. Also continues to note that her sialorrhea is "improved," but continues to say it has not completely  disappeared. Today, she continues to note feeling "a little tired."  Past Psychiatric History:  Past Surgical History:  Procedure Laterality Date   TONSILLECTOMY AND ADENOIDECTOMY Bilateral 11/14/2014   Procedure: BILATERAL TONSILLECTOMY AND ADENOIDECTOMY;  Surgeon: Jodi Marble, MD;  Location: [place];  Service: ENT;  Laterality: Bilateral;   TYMPANOSTOMY TUBE PLACEMENT     Past Medical History:  Past Medical History:  Diagnosis Date   Anemia    Chronic tonsillitis 10/2014   Cough 11/09/2014   Difficulty swallowing pills    No psychiatric disorder found after evaluation 04/14/2022   Family History: History reviewed. No pertinent family history. Family Psychiatric  History: Schizophrenia- Maternal grandfather.  Social History: Marital status: Single Are you sexually active?: No What is your sexual orientation?: Heterosexual Has your sexual activity been affected by drugs, alcohol, medication, or emotional stress?: No Does patient have children?: No   Current Medications: Current Facility-Administered Medications  Medication Dose Route Frequency Provider Last Rate Last Admin   acetaminophen (TYLENOL) tablet 650 mg  650 mg Oral Q6H PRN Massengill, Nathan, MD       alum & mag hydroxide-simeth (MAALOX/MYLANTA) 200-200-20 MG/5ML suspension 30 mL  30 mL Oral Q4H PRN Massengill, Nathan, MD       amLODipine (NORVASC) tablet 10 mg  10 mg Oral Daily Massengill, Nathan, MD   10 mg at 09/23/22 0818   antiseptic oral rinse (BIOTENE) solution 15 mL  15 mL Mouth Rinse 5 X Daily PRN Massengill, Ovid Curd, MD       atenolol (TENORMIN) tablet 12.5 mg  12.5 mg Oral Daily Massengill, Nathan, MD   12.5 mg at 09/23/22 0818   atropine 1 % ophthalmic solution 1 drop  1 drop Sublingual TID  Janine Limbo, MD   1 drop at 09/23/22 1700   cloZAPine (CLOZARIL) tablet 200 mg  200 mg Oral QHS Massengill, Ovid Curd, MD   200 mg at 09/23/22 2111   docusate sodium (COLACE) capsule 100 mg  100 mg  Oral BID Massengill, Ovid Curd, MD   100 mg at 09/23/22 1700   ferrous sulfate tablet 325 mg  325 mg Oral Daily Massengill, Ovid Curd, MD   325 mg at 09/23/22 0818   magnesium hydroxide (MILK OF MAGNESIA) suspension 30 mL  30 mL Oral Daily PRN Massengill, Ovid Curd, MD       metFORMIN (GLUCOPHAGE-XR) 24 hr tablet 500 mg  500 mg Oral Q breakfast Massengill, Ovid Curd, MD   500 mg at 09/23/22 0818   methylphenidate (RITALIN) tablet 10 mg  10 mg Oral Daily Massengill, Ovid Curd, MD   10 mg at 09/23/22 0818   polyethylene glycol (MIRALAX / GLYCOLAX) packet 17 g  17 g Oral Daily Massengill, Ovid Curd, MD   17 g at 09/23/22 0818   sertraline (ZOLOFT) tablet 100 mg  100 mg Oral Daily Massengill, Ovid Curd, MD   100 mg at 09/23/22 0818   vitamin D3 (CHOLECALCIFEROL) tablet 1,000 Units  1,000 Units Oral Daily Massengill, Ovid Curd, MD   1,000 Units at 09/23/22 0818   Lab Results:  Results for orders placed or performed during the hospital encounter of 09/10/22 (from the past 48 hour(s))  CBC with Differential/Platelet     Status: None   Collection Time: 09/22/22  6:11 PM  Result Value Ref Range   WBC 6.0 4.0 - 10.5 K/uL   RBC 4.98 3.87 - 5.11 MIL/uL   Hemoglobin 13.1 12.0 - 15.0 g/dL   HCT 41.8 36.0 - 46.0 %   MCV 83.9 80.0 - 100.0 fL   MCH 26.3 26.0 - 34.0 pg   MCHC 31.3 30.0 - 36.0 g/dL   RDW 14.2 11.5 - 15.5 %   Platelets 271 150 - 400 K/uL   nRBC 0.0 0.0 - 0.2 %   Neutrophils Relative % 61 %   Neutro Abs 3.7 1.7 - 7.7 K/uL   Lymphocytes Relative 29 %   Lymphs Abs 1.7 0.7 - 4.0 K/uL   Monocytes Relative 6 %   Monocytes Absolute 0.4 0.1 - 1.0 K/uL   Eosinophils Relative 2 %   Eosinophils Absolute 0.1 0.0 - 0.5 K/uL   Basophils Relative 1 %   Basophils Absolute 0.0 0.0 - 0.1 K/uL   Immature Granulocytes 1 %   Abs Immature Granulocytes 0.03 0.00 - 0.07 K/uL    Comment: Performed at [facility], [place] [address], [place], [place] [address]    Blood Alcohol level:  Lab Results  Component  Value Date   [facility] <10 10/16/2021   ETH <10 0000000   Metabolic Labs: Lab Results  Component Value Date   HGBA1C 5.2 05/18/2022   MPG 102.54 05/18/2022   MPG 120 04/29/2021   Lab Results  Component Value Date   PROLACTIN 18.5 06/18/2022   PROLACTIN 58.3 (H) 06/06/2022   Lab Results  Component Value Date   CHOL 217 (H) 05/18/2022   TRIG 31 05/18/2022   HDL 71 05/18/2022   CHOLHDL 3.1 05/18/2022   VLDL 6 05/18/2022   LDLCALC 140 (H) 05/18/2022   LDLCALC 94 05/02/2021   Sleep:No data recorded  Physical Findings: AIMS: No  CIWA:    COWS:     Mental Status Exam:   Appearance and Grooming: Patient is casually dressed in gown over  long sleeve blouse and leggings . The patient has no noticeable scent or odor.   Behavior: The patient appears in no acute distress, and during the interview, was calm, focused, required minimal redirection, and behaving appropriately to scenario; she was able to follow commands and compliant to requests and made good eye contact.   Attitude: Patient was cooperative during the interview.   Motor activity: The patient's movement speed was normal; her gait was normal. There was no notable abnormal facial movements and no notable abnormal extremity movements.   Patient does not appear to be responding to external stimuli.   Speech: The patient's speech was clear, fluent, with good articulation, and with appropriately placed inflections. The volume of her speech was soft and normal in quantity. The rate was normal with a normal rhythm. Responses were normal in latency. There were no abnormal patterns in speech.   Mood: "I feel alright"   Affect: Patient's affect is euthymic and constricted with restricted range and even fluctuations; her affect is congruent with her stated mood. -------------------------------------------------------------------------------------------------------------------------   Thought Content The patient experiences  no hallucinations. The patient does not spontaneously discuss delusional thoughts, but when prompted, continues to express persecutory delusions about family members and not feeling safe around them or to even talk to them - likely fixed; she denies thought insertion, denies thought withdrawal, denies thought interruption, and denies thought broadcasting.   Patient denies active suicidal intent and denies passive suicidal ideation; she denies homicidal intent.   Thought Process The patient's thought process is linear and is concrete, not engaging in spontaneous conversation though responding to yes/no questions and short answers.   Insight The patient at the time of interview demonstrates poor insight, as evidenced by lacking understanding of mental health condition/s, inability to identify trigger/s causing mental health decompensation, and inability to identify adaptive and maladaptive coping strategies.   Judgement The patient over the past 24 hours demonstrates fair judgement, as evidenced by adhering to medication regimen and minimally engaging with staff / other patients.  Assets  Assets: Armed forces logistics/support/administrative officer; Physical Health; Resilience  Physical Exam: Constitutional:      Appearance: the patient is not toxic-appearing.  Pulmonary:     Effort: Pulmonary effort is normal.  Neurological:     General: No focal deficit present.     Mental Status: the patient is alert and oriented to person, place, and time.   Review of Systems  Respiratory:  Negative for shortness of breath.   Cardiovascular:  Negative for chest pain.  Gastrointestinal:  Negative for abdominal pain, constipation, diarrhea, nausea and vomiting.  Neurological:  Negative for headaches.   Blood pressure 116/70, pulse (!) 103, temperature 98.5 F (36.9 C), temperature source Oral, resp. rate 18, SpO2 100 %. There is no height or weight on file to calculate BMI.  Treatment Plan Summary: Daily contact with patient to  assess and evaluate symptoms and progress in treatment and Medication management  Diagnoses / Active Problems: Schizoaffective disorder ([place]) Principal Problem:   Schizoaffective disorder ([place]) Active Problems:   GAD (generalized anxiety disorder)   Vitamin D deficiency   PLAN: Safety and Monitoring:  -- Voluntary admission to inpatient psychiatric unit for safety, stabilization and treatment  -- Daily contact with patient to assess and evaluate symptoms and progress in treatment  -- Patient's case to be discussed in multi-disciplinary team meeting  -- Observation Level : q15 minute checks  -- Vital signs:  q12 hours  -- Precautions: suicide, elopement, and assault  2.  Medications:              -- Decrease clozapine from 200 mg to 175 mg qhs - for psychosis, ANC wnl (10/24 - REMS updated), recheck every 7 days  Targeted review of symptoms, specific for clozapine: Malaise/Sedation: endorses feeling "a little tired" Chest pain: denies Shortness of breath: denies Exertional capacity: denies Tachycardia: denies Cough: denies Sore Throat: denies Fever: denies Orthostatic hypotension (dizziness with standing): denies Hypersalivation: "improved" Constipation: denies Nausea: denies Nocturnal enuresis: denies  Symptoms of GERD -Acid reflux: denies -Burning sensation in chest, usually after eating: denies -Trouble swallowing: denies -Sensation of lump in throat: denies -Regurgitation (backwash) of food or sour liquid: denies  -- Continue atropine drops 1% 1 drop tid for sialorrhea.  -- Continue Ritalin 10 mg qam - for daytime sedation 2/2 clozapine and negative symptoms of schizophrenia. -- Continue Zoloft 100 mg daily for depression -- Continue Norvasc 10 mg daily for hypertension -- Continue atenolol 12.5 mg once daily - for tachycardia 2/2 clozapine  -- Continue Colace 100 mg 2 twice daily for constipation -- Continue Miralax daily for constipation -- Continue Ensure TID  for nutritional support -- Continue Ferrous Sulfate 325 mg daily for iron replacement -- Continue Atarax 25 mg TID PRN for anxiety -- Continue Metformin 500 mg XL for weight gain prophylaxis    -- Consider sleep study referral at d/c for excessive somnolence, snoring, daytime fatigue              -- Previously discontinued 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  -- Previously discontinued geodon, caplyta, thorazine, zyprexa, risperdal due to no improvement (also concern for cheeking during previous antipsychotic medication trials)  -- Previously discontinued klonopin for anxiety associated with paranoid thoughts.  -- Completed - Diflucan 150 mg po daily for yeast infection. -- Completed - Megace for AUB   The risks/benefits/side-effects/alternatives to the above medication were discussed in detail with the patient and time was given for questions. The patient consents to medication trial. FDA black box warnings, if present, were discussed.  The patient is agreeable with the medication plan, as above. We will monitor the patient's response to pharmacologic treatment, and adjust medications as necessary.  3. Routine and other pertinent labs:             -- Metabolic profile:  BMI: There is no height or weight on file to calculate BMI.  CBC    Component Value Date/Time   WBC 6.0 09/22/2022 1811   RBC 4.98 09/22/2022 1811   HGB 13.1 09/22/2022 1811   HGB 13.6 02/07/2020 1549   HCT 41.8 09/22/2022 1811   HCT 42.7 02/07/2020 1549   PLT 271 09/22/2022 1811   PLT 273 02/07/2020 1549   MCV 83.9 09/22/2022 1811   MCV 81 02/07/2020 1549   MCH 26.3 09/22/2022 1811   MCHC 31.3 09/22/2022 1811   RDW 14.2 09/22/2022 1811   RDW 13.4 02/07/2020 1549   LYMPHSABS 1.7 09/22/2022 1811   LYMPHSABS 1.9 12/05/2019 1530   MONOABS 0.4 09/22/2022 1811   EOSABS 0.1 09/22/2022 1811   EOSABS 0.3 12/05/2019 1530   BASOSABS 0.0 09/22/2022 1811   BASOSABS 0.0 12/05/2019 1530   Prolactin: Lab Results  Component  Value Date   PROLACTIN 18.5 06/18/2022   PROLACTIN 58.3 (H) 06/06/2022    Lipid Panel: Lab Results  Component Value Date   CHOL 217 (H) 05/18/2022   TRIG 31 05/18/2022   HDL 71 05/18/2022   CHOLHDL 3.1 05/18/2022   VLDL  6 05/18/2022   LDLCALC 140 (H) 05/18/2022   LDLCALC 94 05/02/2021    HbgA1c: Hgb A1c MFr Bld (%)  Date Value  05/18/2022 5.2    TSH: TSH (uIU/mL)  Date Value  06/18/2022 0.964  02/26/2015 1.352    EKG monitoring: QTc: 450 (8/28)  Lab Results  Component Value Date   NEUTROABS 3.7 09/22/2022   4. Group Therapy:             -- Encouraged patient to participate in unit milieu and in scheduled group therapies              -- Short Term Goals: Ability to identify changes in lifestyle to reduce recurrence of condition will improve, Ability to verbalize feelings will improve, Ability to demonstrate self-control will improve, Ability to identify and develop effective coping behaviors will improve, Ability to maintain clinical measurements within normal limits will improve, and Ability to identify triggers associated with substance abuse/mental health issues will improve             -- Long Term Goals: Improvement in symptoms so as ready for discharge -- Patient is encouraged to participate in group therapy while admitted to the psychiatric unit. -- We will address other chronic and acute stressors, which contributed to the patient's Schizoaffective disorder ([place]) in order to reduce the risk of self-harm at discharge.  5. Discharge Planning:   -- Social work and case management to assist with discharge planning and identification of hospital follow-up needs prior to discharge            -- Estimated LOS: dc not before 09-29-22 per court  -- Discharge Concerns: Need to establish a safety plan; Medication compliance and effectiveness  -- Discharge Goals: Return home with outpatient referrals for mental health follow-up including  medication management/psychotherapy  I certify that inpatient services furnished can reasonably be expected to improve the patient's condition.    Camelia Phenes, MD, 09/24/2022, 7:13 AM

## 2022-09-24 NOTE — BHH Counselor (Signed)
CSW spoke with Devoria Albe ([facility]) who states that the [facility] in [place] is possibly interested in accepting the Pt for placement.  Mrs. Ouida Sills states that the group home is requesting current documentation to show that the Pt is not displaying behavioral concerns.  [name] asked the CSW to fax over the latest progress note for the Pt to her office at [phone].  CSW faxed this information to [name] at 12:03pm today.  [name] provided the group home information as being : [facility] [place] [place], [place], [place] [address].  The owner is Mellody Drown at [phone].  Mrs. Ouida Sills states that she will fax the progress note to the group home and that the group home may want to interview the Pt by phone within the coming days.  CSW will update information as it is provided.

## 2022-09-24 NOTE — Progress Notes (Signed)
D: Pt denied SI/HI/AVH this morning. Pt rated her depression a 4/10, anxiety a 4/10, and feelings of hopelessness a 4/10. Pt has been pleasant, calm, and cooperative throughout the shift.   A: RN provided support and encouragement to patient. Pt given scheduled medications as prescribed. Q15 min checks verified for safety.    R: Patient verbally contracts for safety. Patient compliant with medications and treatment plan. Patient is interacting well on the unit. Pt is safe on the unit.   09/24/22 0914  Psych Admission Type (Psych Patients Only)  Admission Status Voluntary  Psychosocial Assessment  Patient Complaints None  Eye Contact Brief  Facial Expression Flat  Affect Appropriate to circumstance  Speech Logical/coherent;Soft  Interaction Minimal;Childlike  Motor Activity Slow  Appearance/Hygiene Unremarkable  Behavior Characteristics Cooperative;Appropriate to situation  Mood Pleasant  Thought Process  Coherency WDL  Content WDL  Delusions None reported or observed  Perception WDL  Hallucination None reported or observed  Judgment Impaired  Confusion None  Danger to Self  Current suicidal ideation? Denies  Self-Injurious Behavior No self-injurious ideation or behavior indicators observed or expressed   Agreement Not to Harm Self Yes  Description of Agreement Verbal  Danger to Others  Danger to Others None reported or observed

## 2022-09-24 NOTE — Progress Notes (Signed)
Adult Psychoeducational Group Note  Date:  09/24/2022 Time:  8:50 PM  Group Topic/Focus:  Wrap-Up Group:   The focus of this group is to help patients review their daily goal of treatment and discuss progress on daily workbooks.  Participation Level:  Active  Participation Quality:  Appropriate  Affect:  Appropriate  Cognitive:  Appropriate  Insight: Appropriate  Engagement in Group:  Engaged  Modes of Intervention:  Education and Exploration  Additional Comments:  patient attended and participated in group tonight. She reports sthat she learn that exercise and meditation is important.  Cheryl Adams [facility] 09/24/2022, 8:50 PM

## 2022-09-25 DIAGNOSIS — F251 Schizoaffective disorder, depressive type: Secondary | ICD-10-CM | POA: Diagnosis not present

## 2022-09-25 NOTE — Group Note (Signed)
LCSW Group Therapy Note   Group Date: 09/25/2022 Start Time: 1300 End Time: 1400  Type of Therapy and Topic:  Group Therapy:  Feelings About Hospitalization  Participation Level:  Active   Description of Group This process group involved patients discussing their feelings related to being hospitalized, as well as the benefits they see to being in the hospital.  These feelings and benefits were itemized.  The group then brainstormed specific ways in which they could seek those same benefits when they discharge and return home.  Therapeutic Goals Patient will identify and describe positive and negative feelings related to hospitalization Patient will verbalize benefits of hospitalization to themselves personally Patients will brainstorm together ways they can obtain similar benefits in the outpatient setting, identify barriers to wellness and possible solutions  Summary of Patient Progress:  The Pt attended group and remained there the entire time.  The Pt accepted all worksheets and followed along throughout the group.  The Pt was appropriate with peers and open to the discussion.  The Pt was able to demonstrate understanding of the information and was able to find alternative ways to address their thoughts, feelings, and actions once they were return home so that they may possibly have a different outcome in the future.    Therapeutic Modalities Cognitive Behavioral Therapy Motivational Interviewing  Darleen Crocker, [place] 09/25/2022  2:08 PM

## 2022-09-25 NOTE — BHH Counselor (Signed)
CSW received a voicemail from Mrs. Harp at the [facility] (Medicaid Division).  CSW returned the call to Mrs. Harp [phone].  CSW was informed that Mrs. Harp needed the [name] H&P and an updated progress note to complete the [name] Medicaid application.  CSW agreed to send this information to Mrs. Harp by fax at [phone].  Mrs. Harp states that once this information is received she can make her finial decision on Medicaid Benefits.  She states that once the Pt is approved for Medicaid then her benefits will begin immediately.  CSW will also send this updated information to the DSS Director so they can discuss the case with the Medicaid office as well.

## 2022-09-25 NOTE — Progress Notes (Signed)
   09/25/22 0515  Sleep  Number of Hours 7.25

## 2022-09-25 NOTE — Progress Notes (Addendum)
[facility] MD Progress Note  09/25/2022 7:06 AM Cheryl Adams  MRN:  [id number]  Principal Problem: Schizoaffective disorder (HCC) Diagnosis: Principal Problem:   Schizoaffective disorder (HCC) Active Problems:   GAD (generalized anxiety disorder)   Vitamin D deficiency   Reason for Admission:  Cheryl Adams is a 31 year old African-American female with prior diagnoses of schizoaffective d/o depressive type and GAD who initially presented to the [facility] urgent care [facility]) accompanied by her mother with complaints of paranoia. Pt was initially admittedon 05-19-2022, for many months, and treated for depression and paranoia with multiple medication and ultimately had partial response to clozapine. DSS was granted legal gaurdianship. On 09-10-2022, pt was d/c for MDE evaluation per court. On 09-10-2022, the pt was  readmitted to the psych unit after the evaluation (admitted on 09/10/2022, total  LOS: 15 days )  Chart Review from last 24 hours:  The patient's chart was reviewed and nursing notes were reviewed. The patient's case was discussed in multidisciplinary team meeting.   - Patient received all scheduled medications - Patient did not receive any PRN medications  Information Obtained Today During Patient Interview: The patient was seen and evaluated on the unit. On assessment today the patient is at baseline and interview was largely unchanged from yesterday or the prior days. She responds appropriately to yes/no questions and gives short answers.  Patient does not spontaneously endorse persecutory delusions of family members, but avoids discussing the topic and continues to state she does not feel safe around them.  I discussed with the patient that she will need to be evaluated by sleep medicine for her daytime fatigue, somnolence, and snoring. She initially denied these symptoms but expressed understanding of the need to get evaluated.  Per Dr. Sherron Flemings, patient  endorsed feeling more depressed today regarding going to group home - patient did not disclose this to me.   Patient endorses good sleep, good appetite.   Patient denies any side-effects attributable to medications. Patient denies any somatic complaints, denies feeling tired. Also continues to note that her sialorrhea is "improved," but continues to say it has not completely disappeared. Today, she denies feeling tired.  Past Psychiatric History:  Past Surgical History:  Procedure Laterality Date   TONSILLECTOMY AND ADENOIDECTOMY Bilateral 11/14/2014   Procedure: BILATERAL TONSILLECTOMY AND ADENOIDECTOMY;  Surgeon: Flo Shanks, MD;  Location: [facility];  Service: ENT;  Laterality: Bilateral;   TYMPANOSTOMY TUBE PLACEMENT     Past Medical History:  Past Medical History:  Diagnosis Date   Anemia    Chronic tonsillitis 10/2014   Cough 11/09/2014   Difficulty swallowing pills    No psychiatric disorder found after evaluation 04/14/2022   Family History: History reviewed. No pertinent family history. Family Psychiatric  History: Schizophrenia- Maternal grandfather.  Social History: Marital status: Single Are you sexually active?: No What is your sexual orientation?: Heterosexual Has your sexual activity been affected by drugs, alcohol, medication, or emotional stress?: No Does patient have children?: No   Current Medications: Current Facility-Administered Medications  Medication Dose Route Frequency Provider Last Rate Last Admin   acetaminophen (TYLENOL) tablet 650 mg  650 mg Oral Q6H PRN Massengill, Nathan, MD       alum & mag hydroxide-simeth (MAALOX/MYLANTA) 200-200-20 MG/5ML suspension 30 mL  30 mL Oral Q4H PRN Massengill, Nathan, MD       amLODipine (NORVASC) tablet 10 mg  10 mg Oral Daily Massengill, Harrold Donath, MD   10 mg at 09/24/22 [phone]  antiseptic oral rinse (BIOTENE) solution 15 mL  15 mL Mouth Rinse 5 X Daily PRN Massengill, Harrold Donath, MD       atenolol  (TENORMIN) tablet 12.5 mg  12.5 mg Oral Daily Massengill, Nathan, MD   12.5 mg at 09/24/22 0758   atropine 1 % ophthalmic solution 1 drop  1 drop Sublingual TID Phineas Inches, MD   1 drop at 09/24/22 1624   cloZAPine (CLOZARIL) tablet 175 mg  175 mg Oral Rosanne Sack, MD   175 mg at 09/24/22 2136   docusate sodium (COLACE) capsule 100 mg  100 mg Oral BID Massengill, Harrold Donath, MD   100 mg at 09/24/22 1624   ferrous sulfate tablet 325 mg  325 mg Oral Daily Massengill, Harrold Donath, MD   325 mg at 09/24/22 0758   magnesium hydroxide (MILK OF MAGNESIA) suspension 30 mL  30 mL Oral Daily PRN Massengill, Harrold Donath, MD       metFORMIN (GLUCOPHAGE-XR) 24 hr tablet 500 mg  500 mg Oral Q breakfast Massengill, Harrold Donath, MD   500 mg at 09/24/22 0757   methylphenidate (RITALIN) tablet 10 mg  10 mg Oral Daily Massengill, Harrold Donath, MD   10 mg at 09/24/22 0759   polyethylene glycol (MIRALAX / GLYCOLAX) packet 17 g  17 g Oral Daily Massengill, Harrold Donath, MD   17 g at 09/24/22 0758   sertraline (ZOLOFT) tablet 100 mg  100 mg Oral Daily Massengill, Harrold Donath, MD   100 mg at 09/24/22 0757   vitamin D3 (CHOLECALCIFEROL) tablet 1,000 Units  1,000 Units Oral Daily Phineas Inches, MD   1,000 Units at 09/24/22 0757   Lab Results:  No results found for this or any previous visit (from the past 48 hour(s)).   Blood Alcohol level:  Lab Results  Component Value Date   ETH <10 10/16/2021   ETH <10 04/24/2021   Metabolic Labs: Lab Results  Component Value Date   HGBA1C 5.2 05/18/2022   MPG 102.54 05/18/2022   MPG 120 04/29/2021   Lab Results  Component Value Date   PROLACTIN 18.5 06/18/2022   PROLACTIN 58.3 (H) 06/06/2022   Lab Results  Component Value Date   CHOL 217 (H) 05/18/2022   TRIG 31 05/18/2022   HDL 71 05/18/2022   CHOLHDL 3.1 05/18/2022   VLDL 6 05/18/2022   LDLCALC 140 (H) 05/18/2022   LDLCALC 94 05/02/2021   Sleep:No data recorded  Physical Findings: AIMS: No  CIWA:    COWS:     Mental  Status Exam:   Appearance and Grooming: Patient is casually dressed in gown over long sleeve blouse and leggings . The patient has no noticeable scent or odor.   Behavior: The patient appears in no acute distress, and during the interview, was calm, focused, required minimal redirection, and behaving appropriately to scenario; she was able to follow commands and compliant to requests and made good eye contact.   Attitude: Patient was cooperative during the interview.   Motor activity: The patient's movement speed was normal; her gait was normal. There was no notable abnormal facial movements and no notable abnormal extremity movements.   Patient does not appear to be responding to external stimuli.   Speech: The patient's speech was clear, fluent, with good articulation, and with appropriately placed inflections. The volume of her speech was soft and normal in quantity. The rate was normal with a normal rhythm. Responses were normal in latency. There were no abnormal patterns in speech.   Mood: "I feel ok"  Affect: Patient's affect is euthymic and constricted with restricted range and even fluctuations; her affect is congruent with her stated mood. -------------------------------------------------------------------------------------------------------------------------   Thought Content The patient experiences no hallucinations. The patient does not spontaneously discuss delusional thoughts, but when prompted, continues to express persecutory delusions about family members and not feeling safe around them or to even talk to them - likely fixed; she denies thought insertion, denies thought withdrawal, denies thought interruption, and denies thought broadcasting.   Patient denies active suicidal intent and denies passive suicidal ideation; she denies homicidal intent.   Thought Process The patient's thought process is linear and is concrete, not engaging in spontaneous conversation  though responding to yes/no questions and short answers.   Insight The patient at the time of interview demonstrates poor insight, as evidenced by lacking understanding of mental health condition/s, inability to identify trigger/s causing mental health decompensation, and inability to identify adaptive and maladaptive coping strategies.   Judgement The patient over the past 24 hours demonstrates fair judgement, as evidenced by adhering to medication regimen and minimally engaging with staff / other patients.  Assets  Assets: Armed forces logistics/support/administrative officer; Physical Health; Resilience  Physical Exam: Constitutional:      Appearance: the patient is not toxic-appearing.  Pulmonary:     Effort: Pulmonary effort is normal.  Neurological:     General: No focal deficit present.     Mental Status: the patient is alert and oriented to person, place, and time.   Review of Systems  Respiratory:  Negative for shortness of breath.   Cardiovascular:  Negative for chest pain.  Gastrointestinal:  Negative for abdominal pain, constipation, diarrhea, nausea and vomiting.  Neurological:  Negative for headaches.   Blood pressure 116/71, pulse 64, temperature 97.7 F (36.5 C), temperature source Oral, resp. rate 18, SpO2 100 %. There is no height or weight on file to calculate BMI.  Treatment Plan Summary: Daily contact with patient to assess and evaluate symptoms and progress in treatment and Medication management  Diagnoses / Active Problems: Schizoaffective disorder ([place]) Principal Problem:   Schizoaffective disorder ([place]) Active Problems:   GAD (generalized anxiety disorder)   Vitamin D deficiency   PLAN: Safety and Monitoring:  -- Voluntary admission to inpatient psychiatric unit for safety, stabilization and treatment  -- Daily contact with patient to assess and evaluate symptoms and progress in treatment  -- Patient's case to be discussed in multi-disciplinary team meeting  -- Observation  Level : q15 minute checks  -- Vital signs:  q12 hours  -- Precautions: suicide, elopement, and assault  2. Medications:              -- Decrease clozapine from 200 mg to 175 mg qhs - for psychosis, ANC wnl (10/24 - REMS updated), recheck every 7 days  Targeted review of symptoms, specific for clozapine: Malaise/Sedation: denies Chest pain: denies Shortness of breath: denies Exertional capacity: denies Tachycardia: denies Cough: denies Sore Throat: denies Fever: denies Orthostatic hypotension (dizziness with standing): denies Hypersalivation: "improved" Constipation: denies Nausea: denies Nocturnal enuresis: denies  Symptoms of GERD -Acid reflux: denies -Burning sensation in chest, usually after eating: denies -Trouble swallowing: denies -Sensation of lump in throat: denies -Regurgitation (backwash) of food or sour liquid: denies  -- Continue atropine drops 1% 1 drop tid for sialorrhea.  -- Continue Ritalin 10 mg qam - for daytime sedation 2/2 clozapine and negative symptoms of schizophrenia. -- Continue Zoloft 100 mg daily for depression -- Continue Norvasc 10 mg daily for hypertension --  Continue atenolol 12.5 mg once daily - for tachycardia 2/2 clozapine  -- Continue Colace 100 mg 2 twice daily for constipation -- Continue Miralax daily for constipation -- Continue Ensure TID for nutritional support -- Continue Ferrous Sulfate 325 mg daily for iron replacement -- Continue Atarax 25 mg TID PRN for anxiety -- Continue Metformin 500 mg XL for weight gain prophylaxis    -- Referred for sleep study at d/c for excessive somnolence, snoring, daytime fatigue              -- Previously discontinued 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  -- Previously discontinued geodon, caplyta, thorazine, zyprexa, risperdal due to no improvement (also concern for cheeking during previous antipsychotic medication trials)  -- Previously discontinued klonopin for anxiety associated with paranoid thoughts.  --  Completed - Diflucan 150 mg po daily for yeast infection. -- Completed - Megace for AUB   The risks/benefits/side-effects/alternatives to the above medication were discussed in detail with the patient and time was given for questions. The patient consents to medication trial. FDA black box warnings, if present, were discussed.  The patient is agreeable with the medication plan, as above. We will monitor the patient's response to pharmacologic treatment, and adjust medications as necessary.  3. Routine and other pertinent labs:             -- Metabolic profile:  BMI: There is no height or weight on file to calculate BMI.  CBC    Component Value Date/Time   WBC 6.0 09/22/2022 1811   RBC 4.98 09/22/2022 1811   HGB 13.1 09/22/2022 1811   HGB 13.6 02/07/2020 1549   HCT 41.8 09/22/2022 1811   HCT 42.7 02/07/2020 1549   PLT 271 09/22/2022 1811   PLT 273 02/07/2020 1549   MCV 83.9 09/22/2022 1811   MCV 81 02/07/2020 1549   MCH 26.3 09/22/2022 1811   MCHC 31.3 09/22/2022 1811   RDW 14.2 09/22/2022 1811   RDW 13.4 02/07/2020 1549   LYMPHSABS 1.7 09/22/2022 1811   LYMPHSABS 1.9 12/05/2019 1530   MONOABS 0.4 09/22/2022 1811   EOSABS 0.1 09/22/2022 1811   EOSABS 0.3 12/05/2019 1530   BASOSABS 0.0 09/22/2022 1811   BASOSABS 0.0 12/05/2019 1530   Prolactin: Lab Results  Component Value Date   PROLACTIN 18.5 06/18/2022   PROLACTIN 58.3 (H) 06/06/2022    Lipid Panel: Lab Results  Component Value Date   CHOL 217 (H) 05/18/2022   TRIG 31 05/18/2022   HDL 71 05/18/2022   CHOLHDL 3.1 05/18/2022   VLDL 6 05/18/2022   LDLCALC 140 (H) 05/18/2022   LDLCALC 94 05/02/2021    HbgA1c: Hgb A1c MFr Bld (%)  Date Value  05/18/2022 5.2    TSH: TSH (uIU/mL)  Date Value  06/18/2022 0.964  02/26/2015 1.352    EKG monitoring: QTc: 450 (8/28)  Lab Results  Component Value Date   NEUTROABS 3.7 09/22/2022   4. Group Therapy:             -- Encouraged patient to participate in unit  milieu and in scheduled group therapies              -- Short Term Goals: Ability to identify changes in lifestyle to reduce recurrence of condition will improve, Ability to verbalize feelings will improve, Ability to demonstrate self-control will improve, Ability to identify and develop effective coping behaviors will improve, Ability to maintain clinical measurements within normal limits will improve, and Ability to identify triggers associated with substance  abuse/mental health issues will improve             -- Long Term Goals: Improvement in symptoms so as ready for discharge -- Patient is encouraged to participate in group therapy while admitted to the psychiatric unit. -- We will address other chronic and acute stressors, which contributed to the patient's Schizoaffective disorder (HCC) in order to reduce the risk of self-harm at discharge.  5. Discharge Planning:   -- Social work and case management to assist with discharge planning and identification of hospital follow-up needs prior to discharge            -- Estimated LOS: dc not before 09-29-22 per court  -- Discharge Concerns: Need to establish a safety plan; Medication compliance and effectiveness  -- Discharge Goals: Return home with outpatient referrals for mental health follow-up including medication management/psychotherapy  I certify that inpatient services furnished can reasonably be expected to improve the patient's condition.    Augusto Gamble, MD, 09/25/2022, 7:06 AM

## 2022-09-25 NOTE — BHH Counselor (Signed)
CSW spoke with Mrs. Cheryl Adams [phone] who states that the group home declined the Pt stating at firs that it was her "behaviors".  [name] then stated to them that she has a copy of the paperwork that was sent and that the Pt does not have any behavioral challenges.  She states that the group home owner then stated that the Pt "was to young for the facility".  Mrs. Cheryl Adams states that she is going to contact the second owner of the group home and see if she can speak with them about this information and if something different can be arranged.  CSW will update providers as information is received.

## 2022-09-25 NOTE — Progress Notes (Signed)
Patient denies SI, HI, and AVH this shift. Patient has been compliant with medications, attended groups and has had no incidents of behavioral dyscontrol.   Assess patient for safety, offer medications as prescribed, engage patient in 1:1 staff talks.   Patient able to contract for safety. Continue to monitor as planned.

## 2022-09-26 NOTE — Progress Notes (Signed)
   09/25/22 2230  Psych Admission Type (Psych Patients Only)  Admission Status Voluntary  Psychosocial Assessment  Patient Complaints None  Eye Contact Avoids  Facial Expression Flat  Affect Appropriate to circumstance  Speech Logical/coherent  Interaction Childlike  Motor Activity Slow  Appearance/Hygiene Disheveled  Behavior Characteristics Appropriate to situation;Cooperative  Mood Pleasant  Thought Process  Coherency WDL  Content WDL  Delusions None reported or observed  Perception WDL  Hallucination None reported or observed  Judgment Impaired  Confusion None  Danger to Self  Current suicidal ideation? Denies  Danger to Others  Danger to Others None reported or observed

## 2022-09-26 NOTE — Progress Notes (Signed)
[facility] MD Progress Note  09/26/2022 7:20 AM Cheryl Adams  MRN:  [id number]  Principal Problem: Schizoaffective disorder (HCC) Diagnosis: Principal Problem:   Schizoaffective disorder (HCC) Active Problems:   GAD (generalized anxiety disorder)   Vitamin D deficiency   Reason for Admission:  Cheryl Adams is a 31 year old African-American female with prior diagnoses of schizoaffective d/o depressive type and GAD who initially presented to the [facility] urgent care [facility]) accompanied by her mother with complaints of paranoia. Pt was initially admittedon 05-19-2022, for many months, and treated for depression and paranoia with multiple medication and ultimately had partial response to clozapine. DSS was granted legal gaurdianship. On 09-10-2022, pt was d/c for MDE evaluation per court. On 09-10-2022, the pt was  readmitted to the psych unit after the evaluation (admitted on 09/10/2022, total  LOS: 16 days )  Chart Review from last 24 hours:  The patient's chart was reviewed and nursing notes were reviewed. The patient's case was discussed in multidisciplinary team meeting.   - Patient received all scheduled medications - Patient did not receive any PRN medications  Information Obtained Today During Patient Interview: The patient was seen and evaluated on the unit. On assessment today the patient is at baseline and interview was largely unchanged from yesterday or the prior days. She responds appropriately to yes/no questions and gives short answers.  Patient does not spontaneously endorse persecutory delusions of family members, but avoids discussing the topic and continues to state she does not feel safe around them.  Patient tells me she is "a little depressed" when I asked about her mood yesterday with her conversation with Dr. Sherron Flemings. She could not tell me why she was feeling depressed, but did confirm it was related to going to group home.   Patient endorses good  sleep, good appetite.   Patient denies any side-effects attributable to medications. Patient denies any somatic complaints, denies feeling tired. Also continues to note that her sialorrhea is "improved," but continues to say it has not completely disappeared. Today, she endorses feeling tired.  Past Psychiatric History:  Past Surgical History:  Procedure Laterality Date   TONSILLECTOMY AND ADENOIDECTOMY Bilateral 11/14/2014   Procedure: BILATERAL TONSILLECTOMY AND ADENOIDECTOMY;  Surgeon: Flo Shanks, MD;  Location: [facility];  Service: ENT;  Laterality: Bilateral;   TYMPANOSTOMY TUBE PLACEMENT     Past Medical History:  Past Medical History:  Diagnosis Date   Anemia    Chronic tonsillitis 10/2014   Cough 11/09/2014   Difficulty swallowing pills    No psychiatric disorder found after evaluation 04/14/2022   Family History: History reviewed. No pertinent family history. Family Psychiatric  History: Schizophrenia- Maternal grandfather.  Social History: Marital status: Single Are you sexually active?: No What is your sexual orientation?: Heterosexual Has your sexual activity been affected by drugs, alcohol, medication, or emotional stress?: No Does patient have children?: No   Current Medications: Current Facility-Administered Medications  Medication Dose Route Frequency Provider Last Rate Last Admin   acetaminophen (TYLENOL) tablet 650 mg  650 mg Oral Q6H PRN Massengill, Nathan, MD       alum & mag hydroxide-simeth (MAALOX/MYLANTA) 200-200-20 MG/5ML suspension 30 mL  30 mL Oral Q4H PRN Massengill, Nathan, MD       amLODipine (NORVASC) tablet 10 mg  10 mg Oral Daily Massengill, Nathan, MD   10 mg at 09/25/22 0818   antiseptic oral rinse (BIOTENE) solution 15 mL  15 mL Mouth Rinse 5 X Daily PRN Massengill,  Harrold Donath, MD       atenolol (TENORMIN) tablet 12.5 mg  12.5 mg Oral Daily Massengill, Nathan, MD   12.5 mg at 09/25/22 0818   atropine 1 % ophthalmic solution 1 drop   1 drop Sublingual TID Phineas Inches, MD   1 drop at 09/25/22 1645   cloZAPine (CLOZARIL) tablet 175 mg  175 mg Oral Rosanne Sack, MD   175 mg at 09/25/22 2116   docusate sodium (COLACE) capsule 100 mg  100 mg Oral BID Massengill, Harrold Donath, MD   100 mg at 09/25/22 1645   ferrous sulfate tablet 325 mg  325 mg Oral Daily Massengill, Harrold Donath, MD   325 mg at 09/25/22 6144   magnesium hydroxide (MILK OF MAGNESIA) suspension 30 mL  30 mL Oral Daily PRN Massengill, Harrold Donath, MD       metFORMIN (GLUCOPHAGE-XR) 24 hr tablet 500 mg  500 mg Oral Q breakfast Massengill, Harrold Donath, MD   500 mg at 09/25/22 0818   methylphenidate (RITALIN) tablet 10 mg  10 mg Oral Daily Massengill, Harrold Donath, MD   10 mg at 09/25/22 3154   polyethylene glycol (MIRALAX / GLYCOLAX) packet 17 g  17 g Oral Daily Massengill, Harrold Donath, MD   17 g at 09/25/22 0819   sertraline (ZOLOFT) tablet 100 mg  100 mg Oral Daily Massengill, Harrold Donath, MD   100 mg at 09/25/22 0086   vitamin D3 (CHOLECALCIFEROL) tablet 1,000 Units  1,000 Units Oral Daily Phineas Inches, MD   1,000 Units at 09/25/22 7619   Lab Results:  No results found for this or any previous visit (from the past 48 hour(s)).   Blood Alcohol level:  Lab Results  Component Value Date   ETH <10 10/16/2021   ETH <10 04/24/2021   Metabolic Labs: Lab Results  Component Value Date   HGBA1C 5.2 05/18/2022   MPG 102.54 05/18/2022   MPG 120 04/29/2021   Lab Results  Component Value Date   PROLACTIN 18.5 06/18/2022   PROLACTIN 58.3 (H) 06/06/2022   Lab Results  Component Value Date   CHOL 217 (H) 05/18/2022   TRIG 31 05/18/2022   HDL 71 05/18/2022   CHOLHDL 3.1 05/18/2022   VLDL 6 05/18/2022   LDLCALC 140 (H) 05/18/2022   LDLCALC 94 05/02/2021   Sleep:No data recorded  Physical Findings: AIMS: No  CIWA:    COWS:     Mental Status Exam:   Appearance and Grooming: Patient is casually dressed in gown over long sleeve blouse and leggings . The patient has no  noticeable scent or odor.   Behavior: The patient appears in no acute distress, and during the interview, was calm, focused, required minimal redirection, and behaving appropriately to scenario; she was able to follow commands and compliant to requests and made good eye contact.   Attitude: Patient was cooperative during the interview.   Motor activity: The patient's movement speed was normal; her gait was normal. There was no notable abnormal facial movements and no notable abnormal extremity movements.   Patient does not appear to be responding to external stimuli.   Speech: The patient's speech was clear, fluent, with good articulation, and with appropriately placed inflections. The volume of her speech was soft and normal in quantity. The rate was normal with a normal rhythm. Responses were normal in latency. There were no abnormal patterns in speech.   Mood: "A little depressed"   Affect: Patient's affect is dysphoric and constricted with restricted range and even fluctuations; her affect is  congruent with her stated mood. -------------------------------------------------------------------------------------------------------------------------   Thought Content The patient experiences no hallucinations. The patient does not spontaneously discuss delusional thoughts, but when prompted, continues to express persecutory delusions about family members and not feeling safe around them or to even talk to them - likely fixed; she denies thought insertion, denies thought withdrawal, denies thought interruption, and denies thought broadcasting.   Patient denies active suicidal intent and denies passive suicidal ideation; she denies homicidal intent.   Thought Process The patient's thought process is linear and is concrete, not engaging in spontaneous conversation though responding to yes/no questions and short answers.   Insight The patient at the time of interview demonstrates poor  insight, as evidenced by lacking understanding of mental health condition/s, inability to identify trigger/s causing mental health decompensation, and inability to identify adaptive and maladaptive coping strategies.   Judgement The patient over the past 24 hours demonstrates fair judgement, as evidenced by adhering to medication regimen and minimally engaging with staff / other patients.  Assets  Assets: Manufacturing systems engineer; Physical Health; Resilience  Physical Exam: Constitutional:      Appearance: the patient is not toxic-appearing.  Pulmonary:     Effort: Pulmonary effort is normal.  Neurological:     General: No focal deficit present.     Mental Status: the patient is alert and oriented to person, place, and time.   Review of Systems  Respiratory:  Negative for shortness of breath.   Cardiovascular:  Negative for chest pain.  Gastrointestinal:  Negative for abdominal pain, constipation, diarrhea, nausea and vomiting.  Neurological:  Negative for headaches.   Blood pressure 122/74, pulse (!) 103, temperature 98.5 F (36.9 C), temperature source Oral, resp. rate 18, SpO2 100 %. There is no height or weight on file to calculate BMI.  Treatment Plan Summary: Daily contact with patient to assess and evaluate symptoms and progress in treatment and Medication management  Diagnoses / Active Problems: Schizoaffective disorder (HCC) Principal Problem:   Schizoaffective disorder (HCC) Active Problems:   GAD (generalized anxiety disorder)   Vitamin D deficiency  Assessment: Patient is at baseline of fixed persecutory delusions, stable psychiatrically, and is mandated via court order to remain admitted for inpatient psychiatric admission until at least (09/29/22)  PLAN: Safety and Monitoring:  -- Voluntary admission to inpatient psychiatric unit for safety, stabilization and treatment  -- Daily contact with patient to assess and evaluate symptoms and progress in treatment  --  Patient's case to be discussed in multi-disciplinary team meeting  -- Observation Level : q15 minute checks  -- Vital signs:  q12 hours  -- Precautions: suicide, elopement, and assault  2. Medications:              -- Decrease clozapine from 200 mg to 175 mg qhs - for psychosis, ANC wnl (10/24 - REMS updated), recheck every 7 days  Targeted review of symptoms, specific for clozapine: Malaise/Sedation: endorses feeling "a little tired" Chest pain: denies Shortness of breath: denies Exertional capacity: denies Tachycardia: denies Cough: denies Sore Throat: denies Fever: denies Orthostatic hypotension (dizziness with standing): denies Hypersalivation: "improved" Constipation: denies Nausea: denies Nocturnal enuresis: denies  Symptoms of GERD -Acid reflux: denies -Burning sensation in chest, usually after eating: denies -Trouble swallowing: denies -Sensation of lump in throat: denies -Regurgitation (backwash) of food or sour liquid: denies  -- Continue atropine drops 1% 1 drop tid for sialorrhea.  -- Continue Ritalin 10 mg qam - for daytime sedation 2/2 clozapine and negative symptoms of schizophrenia. --  Continue Zoloft 100 mg daily for depression -- Continue Norvasc 10 mg daily for hypertension -- Continue atenolol 12.5 mg once daily - for tachycardia 2/2 clozapine  -- Continue Colace 100 mg 2 twice daily for constipation -- Continue Miralax daily for constipation -- Continue Ensure TID for nutritional support -- Continue Ferrous Sulfate 325 mg daily for iron replacement -- Continue Atarax 25 mg TID PRN for anxiety -- Continue Metformin 500 mg XL for weight gain prophylaxis    -- Referred for sleep study at d/c for excessive somnolence, snoring, daytime fatigue              -- Previously discontinued 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  -- Previously discontinued geodon, caplyta, thorazine, zyprexa, risperdal due to no improvement (also concern for cheeking during previous  antipsychotic medication trials)  -- Previously discontinued klonopin for anxiety associated with paranoid thoughts.  -- Completed - Diflucan 150 mg po daily for yeast infection. -- Completed - Megace for AUB   The risks/benefits/side-effects/alternatives to the above medication were discussed in detail with the patient and time was given for questions. The patient consents to medication trial. FDA black box warnings, if present, were discussed.  The patient is agreeable with the medication plan, as above. We will monitor the patient's response to pharmacologic treatment, and adjust medications as necessary.  3. Routine and other pertinent labs:             -- Metabolic profile:  BMI: There is no height or weight on file to calculate BMI.  CBC    Component Value Date/Time   WBC 6.0 09/22/2022 1811   RBC 4.98 09/22/2022 1811   HGB 13.1 09/22/2022 1811   HGB 13.6 02/07/2020 1549   HCT 41.8 09/22/2022 1811   HCT 42.7 02/07/2020 1549   PLT 271 09/22/2022 1811   PLT 273 02/07/2020 1549   MCV 83.9 09/22/2022 1811   MCV 81 02/07/2020 1549   MCH 26.3 09/22/2022 1811   MCHC 31.3 09/22/2022 1811   RDW 14.2 09/22/2022 1811   RDW 13.4 02/07/2020 1549   LYMPHSABS 1.7 09/22/2022 1811   LYMPHSABS 1.9 12/05/2019 1530   MONOABS 0.4 09/22/2022 1811   EOSABS 0.1 09/22/2022 1811   EOSABS 0.3 12/05/2019 1530   BASOSABS 0.0 09/22/2022 1811   BASOSABS 0.0 12/05/2019 1530   Prolactin: Lab Results  Component Value Date   PROLACTIN 18.5 06/18/2022   PROLACTIN 58.3 (H) 06/06/2022    Lipid Panel: Lab Results  Component Value Date   CHOL 217 (H) 05/18/2022   TRIG 31 05/18/2022   HDL 71 05/18/2022   CHOLHDL 3.1 05/18/2022   VLDL 6 05/18/2022   LDLCALC 140 (H) 05/18/2022   LDLCALC 94 05/02/2021    HbgA1c: Hgb A1c MFr Bld (%)  Date Value  05/18/2022 5.2    TSH: TSH (uIU/mL)  Date Value  06/18/2022 0.964  02/26/2015 1.352    EKG monitoring: QTc: 450 (8/28)  Lab Results   Component Value Date   NEUTROABS 3.7 09/22/2022   4. Group Therapy:             -- Encouraged patient to participate in unit milieu and in scheduled group therapies              -- Short Term Goals: Ability to identify changes in lifestyle to reduce recurrence of condition will improve, Ability to verbalize feelings will improve, Ability to demonstrate self-control will improve, Ability to identify and develop effective coping behaviors will improve, Ability to  maintain clinical measurements within normal limits will improve, and Ability to identify triggers associated with substance abuse/mental health issues will improve             -- Long Term Goals: Improvement in symptoms so as ready for discharge -- Patient is encouraged to participate in group therapy while admitted to the psychiatric unit. -- We will address other chronic and acute stressors, which contributed to the patient's Schizoaffective disorder (HCC) in order to reduce the risk of self-harm at discharge.  5. Discharge Planning:   -- Social work and case management to assist with discharge planning and identification of hospital follow-up needs prior to discharge            -- Estimated LOS: dc not before 09-29-22 per court  -- Discharge Concerns: Need to establish a safety plan; Medication compliance and effectiveness  -- Discharge Goals: Return home with outpatient referrals for mental health follow-up including medication management/psychotherapy  I certify that inpatient services furnished can reasonably be expected to improve the patient's condition.    Augusto Gambleaniel Alorah Mcree, MD, 09/26/2022, 7:20 AM

## 2022-09-26 NOTE — [facility] Group Notes (Signed)
Pt did not attend wrap up group this evening. Pt opted to sit in the hallway.

## 2022-09-26 NOTE — Progress Notes (Signed)
Pt did not participate in wrap-up group despite encouragement being provided. Pt sat isolated in the alcove. Pt appeared depressed. 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 She identifies some of her coping skills as meditating and exercising. She rated her anxiety and depression a 4 on a scale of 0-10 (10 being the worst). Pt was not able to identify any stressors or triggers for her anxiety and depression. Pt was minimal and provided short answers. Pt denies SI/HI and AVH. Active listening, reassurance, and support provided. Q 15 min safety checks continue. [name] safety has been maintained.   09/26/22 2000  Psych Admission Type (Psych Patients Only)  Admission Status Voluntary  Psychosocial Assessment  Patient Complaints Anxiety;Depression  Eye Contact Brief;Avoids  Facial Expression Flat  Affect Depressed;Sad;Appropriate to circumstance  Speech Logical/coherent;Soft;Slow  Interaction Assertive;Childlike  Motor Activity Slow  Appearance/Hygiene Unremarkable  Behavior Characteristics Cooperative;Appropriate to situation  Mood Depressed;Anxious;Pleasant  Thought Process  Coherency WDL  Content WDL  Delusions None reported or observed  Perception WDL  Hallucination None reported or observed  Judgment Limited  Confusion None  Danger to Self  Current suicidal ideation? Denies  Self-Injurious Behavior No self-injurious ideation or behavior indicators observed or expressed   Danger to Others  Danger to Others None reported or observed

## 2022-09-26 NOTE — Group Note (Signed)
LCSW Group Therapy Note   09/26/2022 3:47 PM  Type of Therapy and Topic:  Group Therapy:   Introduction to Cognitive-Behavioral Therapy    Participation Level:  None  Description of Group: Participants were asked to identify their overall emotion at the beginning of group.  Patients were asked to identify thoughts they often have while experiencing those emotions, then the opposite -- thoughts that might lead to these emotions.   We also discussed what behaviors might come out of either those thoughts or feelings.  The model of the relationships between feelings, thoughts, and behaviors was explained and diagrammed on the white board.  The remainder of the meeting was spent processing patients' expressing emotions through the CBT lens.  There was an emphasis on the need to check to see if feelings and/or thoughts are based on accurate information.  We also talked about how to appropriately use positive affirmations.  Patients found they could relate to each other's feelings even though their experiences are quite different.  Therapeutic Goals:   Patient will identify their current emotional state Patient will understand the relationship between thoughts, emotions and triggers.  Patient will state at least one positive affirmation they are willing to use at this time   Summary of Patient Progress:   Patient was in the room during group but was either asleep or drowsy the entire time, did not participate.     Therapeutic Modalities: Cognitive Behavioral Therapy Motivational Interviewing

## 2022-09-26 NOTE — Progress Notes (Signed)
   09/26/22 1100  Psych Admission Type (Psych Patients Only)  Admission Status Voluntary  Psychosocial Assessment  Patient Complaints None  Eye Contact Fair  Facial Expression Flat  Affect Appropriate to circumstance  Speech Logical/coherent  Interaction Childlike  Motor Activity Slow  Appearance/Hygiene Disheveled  Behavior Characteristics Cooperative;Appropriate to situation  Mood Pleasant  Aggressive Behavior  Effect No apparent injury  Thought Process  Coherency WDL  Content WDL  Delusions None reported or observed  Perception WDL  Hallucination None reported or observed  Judgment Impaired  Confusion None  Danger to Self  Current suicidal ideation? Denies  Danger to Others  Danger to Others None reported or observed

## 2022-09-26 NOTE — [facility] Group Notes (Signed)
.  Psychoeducational Group Note    Date:  09/26/2022 Time: 1300-1400    Purpose of Group: . The group focus' on teaching patients on how to identify their needs and their Life Skills:  A group where two lists are made. What people need and what are things that we do that are unhealthy. The lists are developed by the patients and it is explained that we often do the actions that are not healthy to get our list of needs met.  Goal:: to develop the coping skills needed to get their needs met  Participation Level:  Active  Participation Quality:  Appropriate  Affect:  Appropriate  Cognitive:  Oriented  Insight: Improving  Engagement in Group:  Engaged  Modes of Intervention:  Activity, Discussion, Education, and Support  Additional Comments:  Rates her energy at a 4/10. Did participate in the gathering of the needs list.  Paulino Rily

## 2022-09-26 NOTE — [facility] Group Notes (Signed)
Goals Group 09/26/22   Group Focus: affirmation, clarity of thought, and goals/reality orientation Treatment Modality:  Psychoeducation Interventions utilized were assignment, group exercise, and support Purpose: To be able to understand and verbalize the reason for their admission to the hospital. To understand that the medication helps with their chemical imbalance but they also need to work on their choices in life. To be challenged to develop a list of 30 positives about themselves. Also introduce the concept that "feelings" are not reality.  Participation Level:  slept in the group  Participation Quality:  sleeping Affect: flat  Cognitive: unable to assess  Insight:  lacking  Engagement in Group:  not Engaged  Additional Comments:  slept throughout the group.  Paulino Rily

## 2022-09-27 DIAGNOSIS — F259 Schizoaffective disorder, unspecified: Secondary | ICD-10-CM | POA: Diagnosis not present

## 2022-09-27 MED ORDER — CLOZAPINE 25 MG PO TABS
150.0000 mg | ORAL_TABLET | Freq: Every day | ORAL | Status: DC
  Administered 2022-09-27 – 2022-09-30 (×4): 150 mg via ORAL
  Filled 2022-09-27 (×5): qty 2

## 2022-09-27 NOTE — [facility] Group Notes (Signed)
Adult Psychoeducational Group Note Date:  09/27/2022 Time:  1100-1130 Group Topic/Focus: PROGRESSIVE RELAXATION. A group where deep breathing is taught and tensing and relaxation muscle groups is used. Imagery is used as well.  Pts are asked to imagine 3 pillars that hold them up when they are not able to hold themselves up and to share that with the group.   Participation Level:  Did not attend  Participation Level: Cheryl Adams

## 2022-09-27 NOTE — [facility] Group Notes (Signed)
Adult Psychoeducational Group  Date:  09/27/2022 Time:  1300-1400  Group Topic/Focus: Continuation of the group from Saturday. Looking at the lists that were created and talking about what needs to be done with the homework of 30 positives about themselves.                                     Talking about taking their power back and helping themselves to develop a positive self esteem.      Participation Quality: did not attend  Cheryl Adams

## 2022-09-27 NOTE — Progress Notes (Signed)
Pt  sitting in alcove sleeping on unit this morning. Pt did attend group when prompted. Medication compliant. Drooling noted while pt was asleep. Pt endorses feelings of paranoia at times. Pt denies a/v hallucinations as well as SI/HI/seff harm thoughts. Pt reports adequate sleep and appetite, denies pain and constipation. Noted to chew medications. Q 15 minute checks ongoing for safety.

## 2022-09-27 NOTE — Progress Notes (Signed)
[facility] MD Progress Note  09/27/2022 12:45 PM Cheryl Adams  MRN:  [id number]  Principal Problem: Schizoaffective disorder ([place]) Diagnosis: Principal Problem:   Schizoaffective disorder ([place]) Active Problems:   GAD (generalized anxiety disorder)   Vitamin D deficiency   Sialorrhea   Reason for Admission:  Cheryl Adams is a 31 year old African-American female with prior diagnoses of schizoaffective d/o depressive type and GAD who initially presented to the [facility] health urgent care [facility]) accompanied by her mother with complaints of paranoia. Pt was initially admitted on 05-19-2022, for many months, and treated for depression and paranoia with multiple medication and ultimately had partial response to clozapine. DSS was granted legal gaurdianship. On 09-10-2022, pt was d/c for MDE evaluation per court. On 09-10-2022, the pt was  readmitted to the psych unit after the evaluation (admitted on 09/10/2022, total  LOS: 17 days )  Chart Review from last 24 hours:  The patient's chart was reviewed and nursing notes were reviewed. The patient's case was discussed in multidisciplinary team meeting. Patient visible on unit and has attempted to attend group activities intermittently.   - Patient received all scheduled medications - Patient did not receive any PRN medications  Information Obtained Today During Patient Interview: The patient was seen and evaluated on the unit where she was observed sleeping in the alcove. She presents drowsy, calm and cooperative. Overall presentation appears to be at baseline and interview was largely unchanged from yesterday or the prior days with the exception of what appears to be increased drowsiness. She responds appropriately to yes/no questions and gives short answers.  Patient does not spontaneously endorse persecutory delusions of family members, but avoids discussing the topic and continues to state she does not feel safe around them.  Patient  reports being "a little sleepy". Reports poor sleep; 4-5 hours and laying in bed awake the rest of the night. Patient noted to be sleeping throughout the night by staff; however has been documented sleeping in the alcove and dozing off during group sessions. She did not attend any morning groups today. Reports good appetite and mood otherwise. Denies any any suicidal/homicidal ideations, auditory/visual hallucinations; appears logical and linear with no observed responding to any external/internal stimuli. No prn medications administered overnight. Plan remains that patient be discharged to group home.   She is medication compliant and denies any side-effects attributable to medications. Patient denies any somatic complaints, with the exception of feeling tired. no obvious sialorrhea noted, continues to endorse it as an occasional occurrence.   Past Psychiatric History:  Past Surgical History:  Procedure Laterality Date   TONSILLECTOMY AND ADENOIDECTOMY Bilateral 11/14/2014   Procedure: BILATERAL TONSILLECTOMY AND ADENOIDECTOMY;  Surgeon: Jodi Marble, MD;  Location: [facility];  Service: ENT;  Laterality: Bilateral;   TYMPANOSTOMY TUBE PLACEMENT     Past Medical History:  Past Medical History:  Diagnosis Date   Anemia    Chronic tonsillitis 10/2014   Cough 11/09/2014   Difficulty swallowing pills    No psychiatric disorder found after evaluation 04/14/2022   Family History: History reviewed. No pertinent family history. Family Psychiatric  History: Schizophrenia- Maternal grandfather.  Social History: Marital status: Single Are you sexually active?: No What is your sexual orientation?: Heterosexual Has your sexual activity been affected by drugs, alcohol, medication, or emotional stress?: No Does patient have children?: No   Current Medications: Current Facility-Administered Medications  Medication Dose Route Frequency Provider Last Rate Last Admin   acetaminophen  (TYLENOL) tablet 650  mg  650 mg Oral Q6H PRN Massengill, Ovid Curd, MD       alum & mag hydroxide-simeth (MAALOX/MYLANTA) 200-200-20 MG/5ML suspension 30 mL  30 mL Oral Q4H PRN Massengill, Ovid Curd, MD       amLODipine (NORVASC) tablet 10 mg  10 mg Oral Daily Massengill, Nathan, MD   10 mg at 09/27/22 [id number]   antiseptic oral rinse (BIOTENE) solution 15 mL  15 mL Mouth Rinse 5 X Daily PRN Massengill, Ovid Curd, MD       atenolol (TENORMIN) tablet 12.5 mg  12.5 mg Oral Daily Massengill, Nathan, MD   12.5 mg at 09/27/22 0737   atropine 1 % ophthalmic solution 1 drop  1 drop Sublingual TID Janine Limbo, MD   1 drop at 09/27/22 [id number]   cloZAPine (CLOZARIL) tablet 150 mg  150 mg Oral QHS Leevy-Johnson, Alexsys Eskin A, NP       docusate sodium (COLACE) capsule 100 mg  100 mg Oral BID Massengill, Ovid Curd, MD   100 mg at 09/27/22 0736   ferrous sulfate tablet 325 mg  325 mg Oral Daily Massengill, Ovid Curd, MD   325 mg at 09/27/22 0736   magnesium hydroxide (MILK OF MAGNESIA) suspension 30 mL  30 mL Oral Daily PRN Massengill, Ovid Curd, MD       metFORMIN (GLUCOPHAGE-XR) 24 hr tablet 500 mg  500 mg Oral Q breakfast Massengill, Nathan, MD   500 mg at 09/27/22 0737   polyethylene glycol (MIRALAX / GLYCOLAX) packet 17 g  17 g Oral Daily Massengill, Ovid Curd, MD   17 g at 09/27/22 0736   sertraline (ZOLOFT) tablet 100 mg  100 mg Oral Daily Massengill, Ovid Curd, MD   100 mg at 09/27/22 0736   vitamin D3 (CHOLECALCIFEROL) tablet 1,000 Units  1,000 Units Oral Daily Janine Limbo, MD   1,000 Units at 09/27/22 [id number]   Lab Results:  No results found for this or any previous visit (from the past 48 hour(s)).   Blood Alcohol level:  Lab Results  Component Value Date   ETH <10 10/16/2021   ETH <10 0000000   Metabolic Labs: Lab Results  Component Value Date   HGBA1C 5.2 05/18/2022   MPG 102.54 05/18/2022   MPG 120 04/29/2021   Lab Results  Component Value Date   PROLACTIN 18.5 06/18/2022   PROLACTIN 58.3 (H)  06/06/2022   Lab Results  Component Value Date   CHOL 217 (H) 05/18/2022   TRIG 31 05/18/2022   HDL 71 05/18/2022   CHOLHDL 3.1 05/18/2022   VLDL 6 05/18/2022   LDLCALC 140 (H) 05/18/2022   LDLCALC 94 05/02/2021   Sleep:Sleep: Good   Physical Findings: AIMS: No  CIWA:    COWS:     Mental Status Exam:   Appearance and Grooming: Patient is casually dressed in personal clothing. The patient has no noticeable scent or odor.   Behavior: The patient appears in no acute distress, and during the interview, was calm, focused, required minimal redirection, and behaving appropriately to scenario; she was able to follow commands and compliant to requests and made fair eye contact.   Attitude: Patient was cooperative during the interview.   Motor activity: The patient's movement speed was slow, her gait was normal. There was no notable abnormal facial movements and no notable abnormal extremity movements.   Patient does not appear to be responding to external stimuli.   Speech: The patient's speech was low/slow, fluent, with fair articulation, and with appropriately placed inflections. The volume of her speech was  soft and low in quantity. The rate was normal with a normal rhythm. Responses were normal in latency. There were no abnormal patterns in speech.   Mood: "A little depressed"   Affect: Patient's affect is dysphoric and constricted with restricted range and even fluctuations; her affect is congruent with her stated mood. -------------------------------------------------------------------------------------------------------------------------   Thought Content The patient experiences no hallucinations. The patient does not spontaneously discuss delusional thoughts; she denies thought insertion, denies thought withdrawal, denies thought interruption, and denies thought broadcasting.   Patient denies active suicidal intent and denies passive suicidal ideation; she denies  homicidal intent.   Thought Process The patient's thought process is linear and is concrete, not engaging in spontaneous conversation though responding to yes/no questions and short answers.   Insight The patient at the time of interview demonstrates shallow insight, as evidenced by lacking full understanding of mental health condition/s, inability to identify trigger/s causing mental health decompensation, and inability to identify adaptive and maladaptive coping strategies.   Judgement The patient over the past 24 hours demonstrates fair judgement, as evidenced by adhering to medication regimen and minimally engaging with staff / other patients.  Assets  Assets: Physical Health; Resilience  Physical Exam: Constitutional:      Appearance: the patient is not toxic-appearing.  Pulmonary:     Effort: Pulmonary effort is normal.  Neurological:     General: No focal deficit present.     Mental Status: the patient is alert and oriented to person, place, and time.   Review of Systems  Respiratory:  Negative for shortness of breath.   Cardiovascular:  Negative for chest pain.  Gastrointestinal:  Negative for abdominal pain, constipation, diarrhea, nausea and vomiting.  Neurological:  Negative for headaches.   Blood pressure 112/81, pulse (!) 101, temperature 98 F (36.7 C), temperature source Oral, resp. rate 16, SpO2 100 %. There is no height or weight on file to calculate BMI.  Treatment Plan Summary: Daily contact with patient to assess and evaluate symptoms and progress in treatment and Medication management  Diagnoses / Active Problems: Schizoaffective disorder ([place]) Principal Problem:   Schizoaffective disorder ([place]) Active Problems:   GAD (generalized anxiety disorder)   Vitamin D deficiency   Sialorrhea  Assessment: Patient is at baseline of fixed persecutory delusions, stable psychiatrically, and is mandated via court order to remain admitted for inpatient psychiatric  admission until at least (09/29/22)  PLAN: Safety and Monitoring:  -- Voluntary admission to inpatient psychiatric unit for safety, stabilization and treatment  -- Daily contact with patient to assess and evaluate symptoms and progress in treatment  -- Patient's case to be discussed in multi-disciplinary team meeting  -- Observation Level : q15 minute checks  -- Vital signs:  q12 hours  -- Precautions: suicide, elopement, and assault  2. Medications:              -- Decrease clozapine from 175 mg to 150 mg qhs - for psychosis, ANC wnl (10/24 - REMS updated), recheck every 7 days. Labs reordered for 09/27/30 0500  Targeted review of symptoms, specific for clozapine: Malaise/Sedation: endorses feeling "a little tired" Chest pain: denies Shortness of breath: denies Exertional capacity: denies Tachycardia: denies Cough: denies Sore Throat: denies Fever: denies Orthostatic hypotension (dizziness with standing): denies Hypersalivation: "improved" Constipation: denies Nausea: denies Nocturnal enuresis: denies  Symptoms of GERD -Acid reflux: denies -Burning sensation in chest, usually after eating: denies -Trouble swallowing: denies -Sensation of lump in throat: denies -Regurgitation (backwash) of food or sour liquid:  denies  Medications:   - Decrease:    - Clozapine 150 mg: sedation   - Continue:  - atropine drops 1% 1 drop tid: sialorrhea.  - Ritalin 10 mg qam: daytime sedation 2/2  clozapine and negative symptoms of  schizophrenia. - Zoloft 100 mg daily: depression - Norvasc 10 mg daily: hypertension - Atenolol 12.5 mg once daily: tachycardia 2/2  clozapine  - Colace 100 mg 2 twice daily: constipation - Miralax daily: constipation - Ensure TID: nutritional support - Ferrous Sulfate 325 mg daily: iron replacement -Atarax 25 mg TID PRN: anxiety - Metformin 500 mg XL: weight gain prophylaxis    - Stop:    - Continue Ritalin 10 mg qam: daytime  sedation 2/2  --  Referred for sleep study at d/c: excessive somnolence,  snoring, daytime fatigue  Recent medication Trials:   - Recently decreased Clozaril 200 mg to 175 mg             - Previously discontinued:  - 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  - Geodon, Caplyta, Thorazine, Zyprexa, Risperdal  due to no improvement (also concern for  cheeking during previous antipsychotic  medication trials)  - Klonopin for anxiety associated with paranoid  thoughts.  - Completed:  - Diflucan 150 mg po daily for yeast infection. - Megace for AUB   The risks/benefits/side-effects/alternatives to the above medication were discussed in detail with the patient and time was given for questions. The patient consents to medication trial. FDA black box warnings, if present, were discussed.  The patient is agreeable with the medication plan, as above. We will monitor the patient's response to pharmacologic treatment, and adjust medications as necessary.  3. Routine and other pertinent labs:             - Metabolic profile:  - Clozaril level  BMI: There is no height or weight on file to calculate BMI.  CBC    Component Value Date/Time   WBC 6.0 09/22/2022 1811   RBC 4.98 09/22/2022 1811   HGB 13.1 09/22/2022 1811   HGB 13.6 02/07/2020 1549   HCT 41.8 09/22/2022 1811   HCT 42.7 02/07/2020 1549   PLT 271 09/22/2022 1811   PLT 273 02/07/2020 1549   MCV 83.9 09/22/2022 1811   MCV 81 02/07/2020 1549   MCH 26.3 09/22/2022 1811   MCHC 31.3 09/22/2022 1811   RDW 14.2 09/22/2022 1811   RDW 13.4 02/07/2020 1549   LYMPHSABS 1.7 09/22/2022 1811   LYMPHSABS 1.9 12/05/2019 1530   MONOABS 0.4 09/22/2022 1811   EOSABS 0.1 09/22/2022 1811   EOSABS 0.3 12/05/2019 1530   BASOSABS 0.0 09/22/2022 1811   BASOSABS 0.0 12/05/2019 1530   Prolactin: Lab Results  Component Value Date   PROLACTIN 18.5 06/18/2022   PROLACTIN 58.3 (H) 06/06/2022    Lipid Panel: Lab Results  Component Value Date   CHOL 217 (H) 05/18/2022   TRIG 31  05/18/2022   HDL 71 05/18/2022   CHOLHDL 3.1 05/18/2022   VLDL 6 05/18/2022   LDLCALC 140 (H) 05/18/2022   LDLCALC 94 05/02/2021    HbgA1c: Hgb A1c MFr Bld (%)  Date Value  05/18/2022 5.2    TSH: TSH (uIU/mL)  Date Value  06/18/2022 0.964  02/26/2015 1.352    EKG monitoring: QTc: 450 (8/28)  Lab Results  Component Value Date   NEUTROABS 3.7 09/22/2022   4. Group Therapy:             -- Encouraged patient to participate  in unit milieu and in  scheduled group therapies              -- Short Term Goals: Ability to identify changes in lifestyle to  reduce recurrence of condition will improve, Ability to  verbalize feelings will improve, Ability to demonstrate self-control will improve, Ability to identify and develop effective coping behaviors will improve, Ability to maintain clinical measurements within normal limits will improve, and Ability to identify triggers associated with substance abuse/mental health issues will improve             -- Long Term Goals: Improvement in symptoms so as ready  for discharge -- Patient is encouraged to participate in group therapy while  admitted to the psychiatric unit. -- We will address other chronic and acute stressors, which  contributed to the patient's Schizoaffective disorder ([place]) in order to reduce the risk of self-harm at discharge.  5. Discharge Planning:   -- Social work and case management to assist with  discharge planning and identification of hospital follow-up  needs prior to discharge            -- Estimated LOS: dc not before 09-29-22 per court  -- Discharge Concerns: Need to establish a safety plan;  Medication compliance and effectiveness  -- Discharge Goals: Return home with outpatient referrals for  mental health follow-up including medication  management/psychotherapy  I certify that inpatient services furnished can reasonably be expected to improve the patient's condition.    Inda Merlin,  NP, 09/27/2022, 12:45 PM Patient ID: Cheryl Adams, female   DOB: 10-27-1991, 31 y.o.   MRN: [id number]

## 2022-09-27 NOTE — Group Note (Signed)
[facility] LCSW Group Therapy Note  Date/Time:  09/27/2022  10:00AM-11:00AM  Type of Therapy and Topic:  Group Therapy:  Music's Effect on Depression and Anxiety  Participation Level:  Did Not Attend   Description of Group: In this process group, members discussed what types of music trigger them to worsening mental health symptoms and what they can do about this in the future.  For instance, we discussed what to do when riding in a friend's car and a song harmful to the patient starts playing.  We also discussed how music can be used as a tool to help with wellness and recovery in various ways including managing depression and anxiety as well as encouraging healthy sleep habits and avoiding relapse into old negative behaviors such as drinking, self-harming, or staying in bed all day.  A variety of songs were played as examples.  Discussion was elicited which showed the group to be in agreement that the songs were quite relatable and have the potential to help them outside of the hospital setting.  Therapeutic Goals: Patients will be introduced to a variety of songs that can relate to self-image and self-love in a helpful way. Patients will explore the impact of different pieces of music on their feelings, i.e. uplifting, triggering, etc. Patients will discuss how to use this self-knowledge to assist in recovery.  Summary of Patient Progress:  Patient was invited to group, did not attend.   Therapeutic Modalities: Solution Focused Brief Therapy Activity   Lynne Takemoto Grossman-Orr, LCSW    

## 2022-09-27 NOTE — Progress Notes (Signed)
Refuse to participate wrap up group

## 2022-09-28 ENCOUNTER — Encounter (HOSPITAL_COMMUNITY): Payer: Self-pay

## 2022-09-28 DIAGNOSIS — F251 Schizoaffective disorder, depressive type: Secondary | ICD-10-CM | POA: Diagnosis not present

## 2022-09-28 NOTE — [facility] Counselor (Addendum)
CSW contacted Mrs. Cheryl Adams [phone] who states that she does not have a link to the court proceedings for tomorrow 09/29/2022 but will send it to me once she receives it.  She states that the court hearing will occur tomorrow, 09/29/2022 at 11:00am.   CSW spoke with Mr. Cheryl Adams [phone] who came by to visit with the Pt today.  He spoke with the Pt about the court date and asked the Pt questions to make sure she understand the reason for the court proceedings and if she has any additional thoughts on who should be her guardian.  The Pt states that she would like Mrs. Cheryl Adams to remain as her guardian and she also states that she is anxious about when she will be able to leave the hospital and where she will go after discharge.  Mr. Cheryl Adams states that he will be advocating for Cheryl Adams to remain the legal guardian.  He also states that he will send CSW the link to the court proceedings to the CSW once he receives them.

## 2022-09-28 NOTE — Group Note (Signed)
Occupational Therapy Group Note  Group Topic: Sleep Hygiene  Group Date: 09/28/2022 Start Time: 1400 End Time: 1440 Facilitators: Brantley Stage, OT   Group Description: Group encouraged increased participation and engagement through topic focused on sleep hygiene. Patients reflected on the quality of sleep they typically receive and identified areas that need improvement. Group was given background information on sleep and sleep hygiene, including common sleep disorders. Group members also received information on how to improve one's sleep and introduced a sleep diary as a tool that can be utilized to track sleep quality over a length of time. Group session ended with patients identifying one or more strategies they could utilize or implement into their sleep routine in order to improve overall sleep quality.        Therapeutic Goal(s):  Identify one or more strategies to improve overall sleep hygiene  Identify one or more areas of sleep that are negatively impacted (sleep too much, too little, etc)     Participation Level: Minimal   Participation Quality: Minimal Cues   Behavior: Calm   Speech/Thought Process: Barely audible   Affect/Mood: Appropriate   Insight: Limited   Judgement: Limited   Individualization: pt was passively engaged in their participation of group discussion/activity. New skills identified  Modes of Intervention: Discussion  Patient Response to Interventions:  Attentive   Plan: Continue to engage patient in OT groups 2 - 3x/week.  09/28/2022  Brantley Stage, OT   Cornell Barman, OT

## 2022-09-28 NOTE — [facility] Group Notes (Signed)
  Spiritual care group on grief and loss facilitated by chaplain Janne Napoleon, [facility]  Group Goal:  Support / Education around grief and loss  Members engage in facilitated group support and psycho-social education.  Group Description:  Following introductions and group rules, group members engaged in facilitated group dialog and support around topic of loss, with particular support around experiences of loss in their lives. Group Identified types of loss (relationships / self / things) and identified patterns, circumstances, and changes that precipitate losses. Reflected on thoughts / feelings around loss, normalized grief responses, and recognized variety in grief experience. Group noted Worden's four tasks of grief in discussion.  Group drew on Adlerian / Rogerian, narrative, MI,  Patient Progress: Cheryl Adams attended group but did not participate.

## 2022-09-28 NOTE — Progress Notes (Signed)
   09/28/22 2125  Psych Admission Type (Psych Patients Only)  Admission Status Voluntary  Psychosocial Assessment  Patient Complaints None  Eye Contact Brief  Facial Expression Flat  Affect Appropriate to circumstance  Speech Soft;Logical/coherent  Interaction Childlike  Motor Activity Slow  Appearance/Hygiene Unremarkable  Behavior Characteristics Appropriate to situation;Cooperative  Mood Pleasant  Thought Process  Coherency WDL  Content WDL  Delusions None reported or observed  Perception WDL  Hallucination None reported or observed  Judgment Impaired  Confusion None  Danger to Self  Current suicidal ideation? Denies  Agreement Not to Harm Self Yes  Description of Agreement Verbal  Danger to Others  Danger to Others None reported or observed   Patient alert and oriented. Presenting appropriate to circumstance with pleasant mood. Patient denies SI, HI, AVH, and pain. Scheduled medications administered to patient, per provider orders. Support and encouragement provided. Routine safety checks conducted every 15 minutes.  Patient contracts for safety and remains safe on the unit.

## 2022-09-28 NOTE — BH IP Treatment Plan (Signed)
Interdisciplinary Treatment and Diagnostic Plan Update  09/28/2022 Time of Session: 0830 Cheryl Adams MRN: [id number]  Principal Diagnosis: Schizoaffective disorder [facility])  Secondary Diagnoses: Principal Problem:   Schizoaffective disorder (HCC) Active Problems:   GAD (generalized anxiety disorder)   Vitamin D deficiency   Sialorrhea   Current Medications:  Current Facility-Administered Medications  Medication Dose Route Frequency Provider Last Rate Last Admin   acetaminophen (TYLENOL) tablet 650 mg  650 mg Oral Q6H PRN Massengill, Harrold Donath, MD       alum & mag hydroxide-simeth (MAALOX/MYLANTA) 200-200-20 MG/5ML suspension 30 mL  30 mL Oral Q4H PRN Massengill, Harrold Donath, MD       amLODipine (NORVASC) tablet 10 mg  10 mg Oral Daily Massengill, Harrold Donath, MD   10 mg at 09/28/22 5361   antiseptic oral rinse (BIOTENE) solution 15 mL  15 mL Mouth Rinse 5 X Daily PRN Massengill, Harrold Donath, MD       atenolol (TENORMIN) tablet 12.5 mg  12.5 mg Oral Daily Massengill, Nathan, MD   12.5 mg at 09/28/22 4431   atropine 1 % ophthalmic solution 1 drop  1 drop Sublingual TID Phineas Inches, MD   1 drop at 09/28/22 5400   cloZAPine (CLOZARIL) tablet 150 mg  150 mg Oral QHS Leevy-Johnson, Brooke A, NP   150 mg at 09/27/22 2132   docusate sodium (COLACE) capsule 100 mg  100 mg Oral BID Massengill, Harrold Donath, MD   100 mg at 09/28/22 8676   ferrous sulfate tablet 325 mg  325 mg Oral Daily Massengill, Harrold Donath, MD   325 mg at 09/28/22 1950   magnesium hydroxide (MILK OF MAGNESIA) suspension 30 mL  30 mL Oral Daily PRN Massengill, Harrold Donath, MD       metFORMIN (GLUCOPHAGE-XR) 24 hr tablet 500 mg  500 mg Oral Q breakfast Massengill, Nathan, MD   500 mg at 09/28/22 0806   polyethylene glycol (MIRALAX / GLYCOLAX) packet 17 g  17 g Oral Daily Massengill, Harrold Donath, MD   17 g at 09/28/22 0806   sertraline (ZOLOFT) tablet 100 mg  100 mg Oral Daily Massengill, Harrold Donath, MD   100 mg at 09/28/22 9326   vitamin D3 (CHOLECALCIFEROL)  tablet 1,000 Units  1,000 Units Oral Daily Massengill, Harrold Donath, MD   1,000 Units at 09/28/22 0805   PTA Medications: Medications Prior to Admission  Medication Sig Dispense Refill Last Dose   amLODipine (NORVASC) 10 MG tablet Take 1 tablet (10 mg total) by mouth daily.      antiseptic oral rinse (BIOTENE) LIQD 15 mLs by Mouth Rinse route 5 (five) times daily as needed for dry mouth.      atenolol (TENORMIN) 25 MG tablet Take 0.5 tablets (12.5 mg total) by mouth daily.      atropine 1 % ophthalmic solution Place 1 drop under the tongue 3 (three) times daily. 2 mL 12    cloZAPine (CLOZARIL) 200 MG tablet Take 1 tablet (200 mg total) by mouth at bedtime.      docusate sodium (COLACE) 100 MG capsule Take 1 capsule (100 mg total) by mouth 2 (two) times daily. 10 capsule 0    ferrous sulfate 325 (65 FE) MG tablet Take 325 mg by mouth daily.      metFORMIN (GLUCOPHAGE-XR) 500 MG 24 hr tablet Take 1 tablet (500 mg total) by mouth daily with breakfast.      methylphenidate (RITALIN) 10 MG tablet Take 1 tablet (10 mg total) by mouth daily.  0    polyethylene glycol (MIRALAX /  GLYCOLAX) 17 g packet Take 17 g by mouth daily. 14 each 0    sertraline (ZOLOFT) 100 MG tablet Take 1 tablet (100 mg total) by mouth daily.      vitamin D3 (CHOLECALCIFEROL) 25 MCG tablet Take 1 tablet (1,000 Units total) by mouth daily. 60 tablet      Patient Stressors:    Patient Strengths:    Treatment Modalities: Medication Management, Group therapy, Case management,  1 to 1 session with clinician, Psychoeducation, Recreational therapy.   Physician Treatment Plan for Primary Diagnosis: Schizoaffective disorder (HCC) Long Term Goal(s): Improvement in symptoms so as ready for discharge   Short Term Goals: Ability to identify changes in lifestyle to reduce recurrence of condition will improve Ability to verbalize feelings will improve Ability to demonstrate self-control will improve Ability to identify and develop  effective coping behaviors will improve Ability to maintain clinical measurements within normal limits will improve Ability to identify triggers associated with substance abuse/mental health issues will improve  Medication Management: Evaluate patient's response, side effects, and tolerance of medication regimen.  Therapeutic Interventions: 1 to 1 sessions, Unit Group sessions and Medication administration.  Evaluation of Outcomes: Progressing  Physician Treatment Plan for Secondary Diagnosis: Principal Problem:   Schizoaffective disorder (HCC) Active Problems:   GAD (generalized anxiety disorder)   Vitamin D deficiency   Sialorrhea  Long Term Goal(s): Improvement in symptoms so as ready for discharge   Short Term Goals: Ability to identify changes in lifestyle to reduce recurrence of condition will improve Ability to verbalize feelings will improve Ability to demonstrate self-control will improve Ability to identify and develop effective coping behaviors will improve Ability to maintain clinical measurements within normal limits will improve Ability to identify triggers associated with substance abuse/mental health issues will improve     Medication Management: Evaluate patient's response, side effects, and tolerance of medication regimen.  Therapeutic Interventions: 1 to 1 sessions, Unit Group sessions and Medication administration.  Evaluation of Outcomes: Progressing   RN Treatment Plan for Primary Diagnosis: Schizoaffective disorder (HCC) Long Term Goal(s): Knowledge of disease and therapeutic regimen to maintain health will improve  Short Term Goals: Ability to remain free from injury will improve, Ability to verbalize frustration and anger appropriately will improve, Ability to demonstrate self-control, Ability to participate in decision making will improve, Ability to verbalize feelings will improve, Ability to disclose and discuss suicidal ideas, Ability to identify and  develop effective coping behaviors will improve, and Compliance with prescribed medications will improve  Medication Management: RN will administer medications as ordered by provider, will assess and evaluate patient's response and provide education to patient for prescribed medication. RN will report any adverse and/or side effects to prescribing provider.  Therapeutic Interventions: 1 on 1 counseling sessions, Psychoeducation, Medication administration, Evaluate responses to treatment, Monitor vital signs and CBGs as ordered, Perform/monitor CIWA, COWS, AIMS and Fall Risk screenings as ordered, Perform wound care treatments as ordered.  Evaluation of Outcomes: Progressing   LCSW Treatment Plan for Primary Diagnosis: Schizoaffective disorder (HCC) Long Term Goal(s): Safe transition to appropriate next level of care at discharge, Engage patient in therapeutic group addressing interpersonal concerns.  Short Term Goals: Engage patient in aftercare planning with referrals and resources, Increase social support, Increase ability to appropriately verbalize feelings, Increase emotional regulation, Facilitate acceptance of mental health diagnosis and concerns, Facilitate patient progression through stages of change regarding substance use diagnoses and concerns, Identify triggers associated with mental health/substance abuse issues, and Increase skills for wellness and recovery  Therapeutic Interventions: Assess for all discharge needs, 1 to 1 time with Education officer, museum, Explore available resources and support systems, Assess for adequacy in community support network, Educate family and significant other(s) on suicide prevention, Complete Psychosocial Assessment, Interpersonal group therapy.  Evaluation of Outcomes: Progressing   Progress in Treatment: Attending groups: Yes. Participating in groups: Yes. Taking medication as prescribed: Yes. Toleration medication: Yes. Family/Significant other contact  made: Yes, individual(s) contacted:  Tobie Lords, mother  Patient understands diagnosis: Yes. Discussing patient identified problems/goals with staff: Yes. Medical problems stabilized or resolved: Yes. Denies suicidal/homicidal ideation: Yes. Issues/concerns per patient self-inventory: Yes. Other: none  New problem(s) identified: No, Describe:  none  New Short Term/Long Term Goal(s): Patient to work towards , elimination of symptoms of psychosis, medication management for mood stabilization; development of comprehensive mental wellness plan.  Patient Goals: No additional goals identified at this time. Patient to continue to work towards original goals identified in initial treatment team meeting. CSW will remain available to patient should they voice additional treatment goals.    Discharge Plan or Barriers: Patient lacks adequate housing/supervision. Plan is for patient to be placed in group home, currently awaiting funding.   Reason for Continuation of Hospitalization: Other; describe patient is boarding  Estimated Length of Stay: TBD  Last [place] Suicide Severity Risk Score: [place] Admission (Discharged) from 05/19/2022 in [facility] ED from 05/18/2022 in [facility] Office Visit from 04/14/2022 in [place] No Risk High Risk No Risk       Last PHQ 2/9 Scores:    04/14/2022   10:46 AM 02/24/2022   11:12 AM 01/13/2022    9:14 AM  Depression screen PHQ 2/9  Decreased Interest 0 0 0  Down, Depressed, Hopeless 0 0 0  PHQ - 2 Score 0 0 0    Scribe for Treatment Team: Larose Kells 09/28/2022 9:14 AM

## 2022-09-28 NOTE — Progress Notes (Signed)
Pt presents with pleasant mood, affect blunted. Cheryl Adams denies any acute concerns, she is quiet but has been interactive on the unit, visible in the dayroom and no inappropriate behaviors noted. She has been compliant with medications. Pt did complete self inventory and rates her depression , anxiety and hopelessness all at 4/10 on scale, 10 being worst 0 being none.  Patient denies any SI or HI. Pt remains with placement issues and per treatment team will have meeting tomorrow with court.  Patient is safe, will con't to monitor.

## 2022-09-28 NOTE — Group Note (Signed)
LCSW Group Therapy Note   Group Date: 09/28/2022 Start Time: 1300 End Time: 1400  Type of Therapy and Topic:  Group Therapy:  How to Apologize    Participation Level:  Active  Description of Group: This group addressed various ways to apologize to other individuals.  The Pt was provided with a worksheet and was asked to discuss a time in their life where they had to apologize to someone else.  The group analyzed taking responsibility for our individual actions, how apologizing can be done effectively, and what thoughts and feelings may be held when we have done something to someone else, as well as how we feel when we are taking action to apologize to them.   Therapeutic Goals Patient will discuss what an apology is. Patient will demonstrate understanding of how an apology is formed. Patient will demonstrate how they can take responsibility for themselves. Patients will discuss potential ways they can use this discussion to help them navigate an apology with people they are close with.    Summary of Patient Progress:  The Pt attended group and remained there the entire time. The Pt accepted all worksheets that were provided and followed along with them throughout the group session.  The Pt was appropriate with their peers and was able to demonstrate understanding of the topic being discussed. The Pt was given a chance to ask questions and share their thoughts and feelings about the topic.    Therapeutic Modalities Cognitive Behavioral Therapy Motivational Interviewing  Joanann Mies M Loyce Flaming, LCSWA 09/28/2022  2:05 PM    

## 2022-09-28 NOTE — Group Note (Signed)
Recreation Therapy Group Note   Group Topic:Stress Management  Group Date: 09/28/2022 Start Time: 0930 End Time: 0945 Facilitators: Alonza Knisley-McCall, LRT,CTRS Location: [address] Dayroom   Goal Area(s) Addresses:  Patient will identify positive stress management techniques. Patient will identify benefits of using stress management post d/c.   Group Description: Meditation.  LRT played a meditation that focused getting renewed for a new day.  It also reinforced the value of positive self talk and keeping a positive attitude.  Patients were to sit comfortable and focus on the meditation as it played to fully engage, relax and clear their minds of any distractions.    Affect/Mood: Appropriate   Participation Level: Engaged   Participation Quality: Independent   Behavior: Appropriate   Speech/Thought Process: Focused   Insight: Good   Judgement: Good   Modes of Intervention: Meditation   Patient Response to Interventions:  Engaged   Education Outcome:  Acknowledges education and In group clarification offered    Clinical Observations/Individualized Feedback: Pt attended and participated in group session.     Plan: Continue to engage patient in RT group sessions 2-3x/week.   Cheryl Adams, LRT,CTRS 09/28/2022 12:34 PM

## 2022-09-28 NOTE — [facility] Group Notes (Signed)
Adult Psychoeducational Group Note  Date:  09/28/2022 Time:  9:22 AM  Group Topic/Focus:  Goals Group:   The focus of this group is to help patients establish daily goals to achieve during treatment and discuss how the patient can incorporate goal setting into their daily lives to aide in recovery.  Participation Level:  Did Not Attend    Dub Mikes 09/28/2022, 9:22 AM

## 2022-09-28 NOTE — Progress Notes (Signed)
[facility] MD Progress Note  09/28/2022 7:01 AM GLORIAN MCDONELL  MRN:  [id number]  Principal Problem: Schizoaffective disorder ([place]) Diagnosis: Principal Problem:   Schizoaffective disorder ([place]) Active Problems:   GAD (generalized anxiety disorder)   Vitamin D deficiency   Sialorrhea   Reason for Admission:  Neomia Herbel is a 31 year old African-American female with prior diagnoses of schizoaffective d/o depressive type and GAD who initially presented to the [facility] health urgent care [facility]) accompanied by her mother with complaints of paranoia. Pt was initially admittedon 05-19-2022, for many months, and treated for depression and paranoia with multiple medication and ultimately had partial response to clozapine. DSS was granted legal gaurdianship. On 09-10-2022, pt was d/c for MDE evaluation per court. On 09-10-2022, the pt was  readmitted to the psych unit after the evaluation (admitted on 09/10/2022, total  LOS: 18 days )  Chart Review from last 24 hours:  The patient's chart was reviewed and nursing notes were reviewed. The patient's case was discussed in multidisciplinary team meeting.   - Patient received all scheduled medications - Patient did not receive any PRN medications  Information Obtained Today During Patient Interview: The patient was seen and evaluated on the unit. On assessment today the patient is at baseline and interview was largely unchanged from yesterday or the prior days. She responds appropriately to yes/no questions and gives short answers.  Patient does not spontaneously endorse persecutory delusions of family members, but avoids discussing the topic and continues to state she does not feel safe around them.  She endorses feeling depressed when prompted to think about her planned discharge in the coming days, but does not elaborate.  Patient endorses good sleep, good appetite.   Patient denies any side-effects attributable to medications. Patient  denies any somatic complaints, denies feeling tired. Also continues to note that her sialorrhea is "improved," but continues to say it has not completely disappeared. Today, she endorses feeling "a little tired" again.  Past Psychiatric History:  Past Surgical History:  Procedure Laterality Date   TONSILLECTOMY AND ADENOIDECTOMY Bilateral 11/14/2014   Procedure: BILATERAL TONSILLECTOMY AND ADENOIDECTOMY;  Surgeon: Jodi Marble, MD;  Location: [place];  Service: ENT;  Laterality: Bilateral;   TYMPANOSTOMY TUBE PLACEMENT     Past Medical History:  Past Medical History:  Diagnosis Date   Anemia    Chronic tonsillitis 10/2014   Cough 11/09/2014   Difficulty swallowing pills    No psychiatric disorder found after evaluation 04/14/2022   Family History: History reviewed. No pertinent family history. Family Psychiatric  History: Schizophrenia- Maternal grandfather.  Social History: Marital status: Single Are you sexually active?: No What is your sexual orientation?: Heterosexual Has your sexual activity been affected by drugs, alcohol, medication, or emotional stress?: No Does patient have children?: No   Current Medications: Current Facility-Administered Medications  Medication Dose Route Frequency Provider Last Rate Last Admin   acetaminophen (TYLENOL) tablet 650 mg  650 mg Oral Q6H PRN Massengill, Nathan, MD       alum & mag hydroxide-simeth (MAALOX/MYLANTA) 200-200-20 MG/5ML suspension 30 mL  30 mL Oral Q4H PRN Massengill, Nathan, MD       amLODipine (NORVASC) tablet 10 mg  10 mg Oral Daily Massengill, Nathan, MD   10 mg at 09/27/22 0737   antiseptic oral rinse (BIOTENE) solution 15 mL  15 mL Mouth Rinse 5 X Daily PRN Massengill, Ovid Curd, MD       atenolol (TENORMIN) tablet 12.5 mg  12.5 mg Oral  Daily Massengill, Harrold Donath, MD   12.5 mg at 09/27/22 0737   atropine 1 % ophthalmic solution 1 drop  1 drop Sublingual TID Phineas Inches, MD   1 drop at 09/27/22 1655    cloZAPine (CLOZARIL) tablet 150 mg  150 mg Oral QHS Leevy-Johnson, Brooke A, NP   150 mg at 09/27/22 2132   docusate sodium (COLACE) capsule 100 mg  100 mg Oral BID Massengill, Harrold Donath, MD   100 mg at 09/27/22 1655   ferrous sulfate tablet 325 mg  325 mg Oral Daily Massengill, Harrold Donath, MD   325 mg at 09/27/22 0736   magnesium hydroxide (MILK OF MAGNESIA) suspension 30 mL  30 mL Oral Daily PRN Massengill, Harrold Donath, MD       metFORMIN (GLUCOPHAGE-XR) 24 hr tablet 500 mg  500 mg Oral Q breakfast Massengill, Nathan, MD   500 mg at 09/27/22 0737   polyethylene glycol (MIRALAX / GLYCOLAX) packet 17 g  17 g Oral Daily Massengill, Harrold Donath, MD   17 g at 09/27/22 0736   sertraline (ZOLOFT) tablet 100 mg  100 mg Oral Daily Massengill, Harrold Donath, MD   100 mg at 09/27/22 0736   vitamin D3 (CHOLECALCIFEROL) tablet 1,000 Units  1,000 Units Oral Daily Phineas Inches, MD   1,000 Units at 09/27/22 4098   Lab Results:  No results found for this or any previous visit (from the past 48 hour(s)).   Blood Alcohol level:  Lab Results  Component Value Date   ETH <10 10/16/2021   ETH <10 04/24/2021   Metabolic Labs: Lab Results  Component Value Date   HGBA1C 5.2 05/18/2022   MPG 102.54 05/18/2022   MPG 120 04/29/2021   Lab Results  Component Value Date   PROLACTIN 18.5 06/18/2022   PROLACTIN 58.3 (H) 06/06/2022   Lab Results  Component Value Date   CHOL 217 (H) 05/18/2022   TRIG 31 05/18/2022   HDL 71 05/18/2022   CHOLHDL 3.1 05/18/2022   VLDL 6 05/18/2022   LDLCALC 140 (H) 05/18/2022   LDLCALC 94 05/02/2021   Sleep:Sleep: Good   Physical Findings: AIMS: No  CIWA:    COWS:     Mental Status Exam:   Appearance and Grooming: Patient is casually dressed in gown over long sleeve blouse and leggings . The patient has no noticeable scent or odor.   Behavior: The patient appears in no acute distress, and during the interview, was calm, focused, required minimal redirection, and behaving  appropriately to scenario; she was able to follow commands and compliant to requests and made good eye contact.   Attitude: Patient was cooperative during the interview.   Motor activity: The patient's movement speed was normal; her gait was normal. There was no notable abnormal facial movements and no notable abnormal extremity movements.   Patient does not appear to be responding to external stimuli.   Speech: The patient's speech was clear, fluent, with good articulation, and with appropriately placed inflections. The volume of her speech was soft and normal in quantity. The rate was normal with a normal rhythm. Responses were normal in latency. There were no abnormal patterns in speech.   Mood: "Depressed"   Affect: Patient's affect is dysphoric and constricted with restricted range and even fluctuations; her affect is congruent with her stated mood. -------------------------------------------------------------------------------------------------------------------------   Thought Content The patient experiences no hallucinations. The patient does not spontaneously discuss delusional thoughts, but when prompted, continues to express persecutory delusions about family members and not feeling safe around them  or to even talk to them - likely fixed; she denies thought insertion, denies thought withdrawal, denies thought interruption, and denies thought broadcasting.   Patient denies active suicidal intent and denies passive suicidal ideation; she denies homicidal intent.   Thought Process The patient's thought process is linear and is concrete, not engaging in spontaneous conversation though responding to yes/no questions and short answers.   Insight The patient at the time of interview demonstrates poor insight, as evidenced by lacking understanding of mental health condition/s, inability to identify trigger/s causing mental health decompensation, and inability to identify adaptive and  maladaptive coping strategies.   Judgement The patient over the past 24 hours demonstrates fair judgement, as evidenced by adhering to medication regimen and minimally engaging with staff / other patients.  Assets  Assets: Physical Health; Resilience  Physical Exam: Constitutional:      Appearance: the patient is not toxic-appearing.  Pulmonary:     Effort: Pulmonary effort is normal.  Neurological:     General: No focal deficit present.     Mental Status: the patient is alert and oriented to person, place, and time.   Review of Systems  Respiratory:  Negative for shortness of breath.   Cardiovascular:  Negative for chest pain.  Gastrointestinal:  Negative for abdominal pain, constipation, diarrhea, nausea and vomiting.  Neurological:  Negative for headaches.   Blood pressure 122/62, pulse (!) 107, temperature 98.9 F (37.2 C), temperature source Oral, resp. rate 17, SpO2 100 %. There is no height or weight on file to calculate BMI.  Treatment Plan Summary: Daily contact with patient to assess and evaluate symptoms and progress in treatment and Medication management  Diagnoses / Active Problems: Schizoaffective disorder (HCC) Principal Problem:   Schizoaffective disorder (HCC) Active Problems:   GAD (generalized anxiety disorder)   Vitamin D deficiency   Sialorrhea  Assessment: Patient is at baseline of fixed persecutory delusions, stable psychiatrically, and is mandated via court order to remain admitted for inpatient psychiatric admission until at least (09/29/22)  PLAN: Safety and Monitoring:  -- Voluntary admission to inpatient psychiatric unit for safety, stabilization and treatment  -- Daily contact with patient to assess and evaluate symptoms and progress in treatment  -- Patient's case to be discussed in multi-disciplinary team meeting  -- Observation Level : q15 minute checks  -- Vital signs:  q12 hours  -- Precautions: suicide, elopement, and assault  2.  Medications:              -- Continue clozapine 150 mg qhs - for psychosis, ANC wnl (10/24 - REMS updated), recheck every 7 days  Targeted review of symptoms, specific for clozapine: Malaise/Sedation: endorses feeling "a little tired" Chest pain: denies Shortness of breath: denies Exertional capacity: denies Tachycardia: denies Cough: denies Sore Throat: denies Fever: denies Orthostatic hypotension (dizziness with standing): denies Hypersalivation: "improved" Constipation: denies Nausea: denies Nocturnal enuresis: denies  Symptoms of GERD -Acid reflux: denies -Burning sensation in chest, usually after eating: denies -Trouble swallowing: denies -Sensation of lump in throat: denies -Regurgitation (backwash) of food or sour liquid: denies  -- Continue atropine drops 1% 1 drop tid for sialorrhea.  -- Continue Ritalin 10 mg qam - for daytime sedation 2/2 clozapine and negative symptoms of schizophrenia. -- Continue Zoloft 100 mg daily for depression -- Continue Norvasc 10 mg daily for hypertension -- Continue atenolol 12.5 mg once daily - for tachycardia 2/2 clozapine  -- Continue Colace 100 mg 2 twice daily for constipation -- Continue Miralax daily  for constipation -- Continue Ensure TID for nutritional support -- Continue Ferrous Sulfate 325 mg daily for iron replacement -- Continue Atarax 25 mg TID PRN for anxiety -- Continue Metformin 500 mg XL for weight gain prophylaxis    -- Referred for sleep study at d/c for excessive somnolence, snoring, daytime fatigue              -- Previously discontinued 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  -- Previously discontinued geodon, caplyta, thorazine, zyprexa, risperdal due to no improvement (also concern for cheeking during previous antipsychotic medication trials)  -- Previously discontinued klonopin for anxiety associated with paranoid thoughts.  -- Completed - Diflucan 150 mg po daily for yeast infection. -- Completed - Megace for AUB   The  risks/benefits/side-effects/alternatives to the above medication were discussed in detail with the patient and time was given for questions. The patient consents to medication trial. FDA black box warnings, if present, were discussed.  The patient is agreeable with the medication plan, as above. We will monitor the patient's response to pharmacologic treatment, and adjust medications as necessary.  3. Routine and other pertinent labs:             -- Metabolic profile:  BMI: There is no height or weight on file to calculate BMI.  CBC    Component Value Date/Time   WBC 6.0 09/22/2022 1811   RBC 4.98 09/22/2022 1811   HGB 13.1 09/22/2022 1811   HGB 13.6 02/07/2020 1549   HCT 41.8 09/22/2022 1811   HCT 42.7 02/07/2020 1549   PLT 271 09/22/2022 1811   PLT 273 02/07/2020 1549   MCV 83.9 09/22/2022 1811   MCV 81 02/07/2020 1549   MCH 26.3 09/22/2022 1811   MCHC 31.3 09/22/2022 1811   RDW 14.2 09/22/2022 1811   RDW 13.4 02/07/2020 1549   LYMPHSABS 1.7 09/22/2022 1811   LYMPHSABS 1.9 12/05/2019 1530   MONOABS 0.4 09/22/2022 1811   EOSABS 0.1 09/22/2022 1811   EOSABS 0.3 12/05/2019 1530   BASOSABS 0.0 09/22/2022 1811   BASOSABS 0.0 12/05/2019 1530   Prolactin: Lab Results  Component Value Date   PROLACTIN 18.5 06/18/2022   PROLACTIN 58.3 (H) 06/06/2022    Lipid Panel: Lab Results  Component Value Date   CHOL 217 (H) 05/18/2022   TRIG 31 05/18/2022   HDL 71 05/18/2022   CHOLHDL 3.1 05/18/2022   VLDL 6 05/18/2022   LDLCALC 140 (H) 05/18/2022   LDLCALC 94 05/02/2021    HbgA1c: Hgb A1c MFr Bld (%)  Date Value  05/18/2022 5.2    TSH: TSH (uIU/mL)  Date Value  06/18/2022 0.964  02/26/2015 1.352    EKG monitoring: QTc: 450 (8/28)  Lab Results  Component Value Date   NEUTROABS 3.7 09/22/2022   Repeat CBC w/ differential ordered for tomorrow (10/31) for ANC monitoring  4. Group Therapy:             -- Encouraged patient to participate in unit milieu and in  scheduled group therapies              -- Short Term Goals: Ability to identify changes in lifestyle to reduce recurrence of condition will improve, Ability to verbalize feelings will improve, Ability to demonstrate self-control will improve, Ability to identify and develop effective coping behaviors will improve, Ability to maintain clinical measurements within normal limits will improve, and Ability to identify triggers associated with substance abuse/mental health issues will improve             --  Long Term Goals: Improvement in symptoms so as ready for discharge -- Patient is encouraged to participate in group therapy while admitted to the psychiatric unit. -- We will address other chronic and acute stressors, which contributed to the patient's Schizoaffective disorder (HCC) in order to reduce the risk of self-harm at discharge.  5. Discharge Planning:   -- Social work and case management to assist with discharge planning and identification of hospital follow-up needs prior to discharge            -- Estimated LOS: dc not before 09-29-22 per court  -- Discharge Concerns: Need to establish a safety plan; Medication compliance and effectiveness  -- Discharge Goals: Return home with outpatient referrals for mental health follow-up including medication management/psychotherapy  I certify that inpatient services furnished can reasonably be expected to improve the patient's condition.    I discussed my assessment, planned testing and intervention for the patient with Dr. Sherron Flemings who agrees with my formulated course of action.   Augusto Gamble, MD, 09/28/2022, 7:01 AM

## 2022-09-29 DIAGNOSIS — F251 Schizoaffective disorder, depressive type: Secondary | ICD-10-CM | POA: Diagnosis not present

## 2022-09-29 LAB — CBC WITH DIFFERENTIAL/PLATELET
Abs Immature Granulocytes: 0.02 10*3/uL (ref 0.00–0.07)
Basophils Absolute: 0 10*3/uL (ref 0.0–0.1)
Basophils Relative: 0 %
Eosinophils Absolute: 0.2 10*3/uL (ref 0.0–0.5)
Eosinophils Relative: 3 %
HCT: 39.6 % (ref 36.0–46.0)
Hemoglobin: 12.2 g/dL (ref 12.0–15.0)
Immature Granulocytes: 0 %
Lymphocytes Relative: 29 %
Lymphs Abs: 1.3 10*3/uL (ref 0.7–4.0)
MCH: 25.6 pg — ABNORMAL LOW (ref 26.0–34.0)
MCHC: 30.8 g/dL (ref 30.0–36.0)
MCV: 83.2 fL (ref 80.0–100.0)
Monocytes Absolute: 0.3 10*3/uL (ref 0.1–1.0)
Monocytes Relative: 7 %
Neutro Abs: 2.7 10*3/uL (ref 1.7–7.7)
Neutrophils Relative %: 61 %
Platelets: 236 10*3/uL (ref 150–400)
RBC: 4.76 MIL/uL (ref 3.87–5.11)
RDW: 14.2 % (ref 11.5–15.5)
WBC: 4.6 10*3/uL (ref 4.0–10.5)
nRBC: 0 % (ref 0.0–0.2)

## 2022-09-29 LAB — CLOZAPINE (CLOZARIL)
Clozapine Lvl: 129 ng/mL — ABNORMAL LOW (ref 350–600)
NorClozapine: 69 ng/mL
Total(Cloz+Norcloz): 198 ng/mL

## 2022-09-29 NOTE — Group Note (Signed)
Recreation Therapy Group Note   Group Topic:Animal Assisted Therapy   Group Date: 09/29/2022 Start Time: 1430 End Time: 1515 Facilitators: Jaquelynn Wanamaker-McCall, LRT,CTRS Location: [address] Dayroom   Animal-Assisted Activity (AAA) Program Checklist/Progress Notes Patient Eligibility Criteria Checklist & Daily Group note for Rec Tx Intervention  AAA/T Program Assumption of Risk Form signed by Patient/ or Parent Legal Guardian Yes  Patient understands his/her participation is voluntary Yes   Affect/Mood: N/A   Participation Level: Did not attend    Clinical Observations/Individualized Feedback:     Plan: Continue to engage patient in RT group sessions 2-3x/week.   Aryon Nham-McCall, LRT,CTRS 09/29/2022 3:36 PM

## 2022-09-29 NOTE — Progress Notes (Addendum)
D:  Patient denied SI and HI, contracts for safety.  Denied A/V hallucinations.  Denied pain. A:  Medications administered per MD orders.  Emotional support and encouragement given patient. R:  Safety maintained with 15 minute checks.  Patient stated she slept good last night.

## 2022-09-29 NOTE — Progress Notes (Signed)
[facility] MD Progress Note  09/29/2022 6:58 AM Cheryl Adams  MRN:  [id number]  Principal Problem: Schizoaffective disorder ([place]) Diagnosis: Principal Problem:   Schizoaffective disorder ([place]) Active Problems:   GAD (generalized anxiety disorder)   Vitamin D deficiency   Sialorrhea   Reason for Admission:  Cheryl Adams is a 31 year old African-American female with prior diagnoses of schizoaffective d/o depressive type and GAD who initially presented to the [facility] health urgent care [facility]) accompanied by her mother with complaints of paranoia. Pt was initially admittedon 05-19-2022, for many months, and treated for depression and paranoia with multiple medication and ultimately had partial response to clozapine. DSS was granted legal gaurdianship. On 09-10-2022, pt was d/c for MDE evaluation per court. On 09-10-2022, the pt was  readmitted to the psych unit after the evaluation (admitted on 09/10/2022, total  LOS: 19 days )  Chart Review from last 24 hours:  The patient's chart was reviewed and nursing notes were reviewed. The patient's case was discussed in multidisciplinary team meeting.   - Patient received all scheduled medications - Patient did not receive any PRN medications  Information Obtained Today During Patient Interview: The patient was seen and evaluated on the unit. On assessment today the patient is at baseline and interview was largely unchanged from yesterday or the prior days. She responds appropriately to yes/no questions and gives short answers.  Patient does not spontaneously endorse persecutory delusions of family members, but avoids discussing the topic and continues to state she does not feel safe around them.  She continues to endorse feeling depressed when prompted to think about her planned discharge in the coming days, but does not elaborate further and says, "I do not know."  Patient endorses fair sleep, good appetite.   Patient denies any  side-effects attributable to medications. Patient denies any somatic complaints, denies feeling tired. Also today notes that her sialorrhea is "significantly improved," but continues to say it has not completely disappeared. Today, she continues to endorse feeling "a little tired."  Past Psychiatric History:  Past Surgical History:  Procedure Laterality Date   TONSILLECTOMY AND ADENOIDECTOMY Bilateral 11/14/2014   Procedure: BILATERAL TONSILLECTOMY AND ADENOIDECTOMY;  Surgeon: Jodi Marble, MD;  Location: [place];  Service: ENT;  Laterality: Bilateral;   TYMPANOSTOMY TUBE PLACEMENT     Past Medical History:  Past Medical History:  Diagnosis Date   Anemia    Chronic tonsillitis 10/2014   Cough 11/09/2014   Difficulty swallowing pills    No psychiatric disorder found after evaluation 04/14/2022   Family History: History reviewed. No pertinent family history. Family Psychiatric  History: Schizophrenia- Maternal grandfather.  Social History: Marital status: Single Are you sexually active?: No What is your sexual orientation?: Heterosexual Has your sexual activity been affected by drugs, alcohol, medication, or emotional stress?: No Does patient have children?: No   Current Medications: Current Facility-Administered Medications  Medication Dose Route Frequency Provider Last Rate Last Admin   acetaminophen (TYLENOL) tablet 650 mg  650 mg Oral Q6H PRN Massengill, Nathan, MD       alum & mag hydroxide-simeth (MAALOX/MYLANTA) 200-200-20 MG/5ML suspension 30 mL  30 mL Oral Q4H PRN Massengill, Nathan, MD       amLODipine (NORVASC) tablet 10 mg  10 mg Oral Daily Massengill, Nathan, MD   10 mg at 09/28/22 0806   antiseptic oral rinse (BIOTENE) solution 15 mL  15 mL Mouth Rinse 5 X Daily PRN Janine Limbo, MD  atenolol (TENORMIN) tablet 12.5 mg  12.5 mg Oral Daily Massengill, Nathan, MD   12.5 mg at 09/28/22 9509   atropine 1 % ophthalmic solution 1 drop  1 drop  Sublingual TID Janine Limbo, MD   1 drop at 09/28/22 1630   cloZAPine (CLOZARIL) tablet 150 mg  150 mg Oral QHS Leevy-Johnson, Brooke A, NP   150 mg at 09/28/22 2126   docusate sodium (COLACE) capsule 100 mg  100 mg Oral BID Massengill, Ovid Curd, MD   100 mg at 09/28/22 1621   ferrous sulfate tablet 325 mg  325 mg Oral Daily Massengill, Ovid Curd, MD   325 mg at 09/28/22 3267   magnesium hydroxide (MILK OF MAGNESIA) suspension 30 mL  30 mL Oral Daily PRN Massengill, Ovid Curd, MD       metFORMIN (GLUCOPHAGE-XR) 24 hr tablet 500 mg  500 mg Oral Q breakfast Massengill, Nathan, MD   500 mg at 09/28/22 1245   polyethylene glycol (MIRALAX / GLYCOLAX) packet 17 g  17 g Oral Daily Massengill, Ovid Curd, MD   17 g at 09/28/22 0806   sertraline (ZOLOFT) tablet 100 mg  100 mg Oral Daily Massengill, Ovid Curd, MD   100 mg at 09/28/22 8099   vitamin D3 (CHOLECALCIFEROL) tablet 1,000 Units  1,000 Units Oral Daily Massengill, Ovid Curd, MD   1,000 Units at 09/28/22 0805   Lab Results:  No results found for this or any previous visit (from the past 29 hour(s)).   Blood Alcohol level:  Lab Results  Component Value Date   ETH <10 10/16/2021   ETH <10 83/38/2505   Metabolic Labs: Lab Results  Component Value Date   HGBA1C 5.2 05/18/2022   MPG 102.54 05/18/2022   MPG 120 04/29/2021   Lab Results  Component Value Date   PROLACTIN 18.5 06/18/2022   PROLACTIN 58.3 (H) 06/06/2022   Lab Results  Component Value Date   CHOL 217 (H) 05/18/2022   TRIG 31 05/18/2022   HDL 71 05/18/2022   CHOLHDL 3.1 05/18/2022   VLDL 6 05/18/2022   LDLCALC 140 (H) 05/18/2022   LDLCALC 94 05/02/2021   Sleep:No data recorded   Physical Findings: AIMS: No  CIWA:    COWS:     Mental Status Exam:   Appearance and Grooming: Patient is casually dressed in gown over long sleeve blouse and leggings . The patient has no noticeable scent or odor.   Behavior: The patient appears in no acute distress, and during the interview,  was calm, focused, required minimal redirection, and behaving appropriately to scenario; she was able to follow commands and compliant to requests and made good eye contact.   Attitude: Patient was cooperative during the interview.   Motor activity: The patient's movement speed was normal; her gait was normal. There was no notable abnormal facial movements and no notable abnormal extremity movements.   Patient does not appear to be responding to external stimuli.   Speech: The patient's speech was clear, fluent, with good articulation, and with appropriately placed inflections. The volume of her speech was soft and normal in quantity. The rate was normal with a normal rhythm. Responses were normal in latency. There were no abnormal patterns in speech.   Mood: "Depressed, a little tired"   Affect: Patient's affect is dysphoric and constricted with restricted range and even fluctuations; her affect is congruent with her stated mood. -------------------------------------------------------------------------------------------------------------------------   Thought Content The patient experiences no hallucinations. The patient does not spontaneously discuss delusional thoughts, but when prompted, continues  to express persecutory delusions about family members and not feeling safe around them or to even talk to them - likely fixed; she denies thought insertion, denies thought withdrawal, denies thought interruption, and denies thought broadcasting.   Patient denies active suicidal intent and denies passive suicidal ideation; she denies homicidal intent.   Thought Process The patient's thought process is linear and is concrete, not engaging in spontaneous conversation though responding to yes/no questions and short answers.   Insight The patient at the time of interview demonstrates poor insight, as evidenced by lacking understanding of mental health condition/s, inability to identify trigger/s  causing mental health decompensation, and inability to identify adaptive and maladaptive coping strategies.   Judgement The patient over the past 24 hours demonstrates fair judgement, as evidenced by adhering to medication regimen and minimally engaging with staff / other patients.  Assets  Assets: Physical Health; Resilience  Physical Exam: Constitutional:      Appearance: the patient is not toxic-appearing.  Pulmonary:     Effort: Pulmonary effort is normal.  Neurological:     General: No focal deficit present.     Mental Status: the patient is alert and oriented to person, place, and time.   Review of Systems  Respiratory:  Negative for shortness of breath.   Cardiovascular:  Negative for chest pain.  Gastrointestinal:  Negative for abdominal pain, constipation, diarrhea, nausea and vomiting.  Neurological:  Negative for headaches.   Blood pressure 117/68, pulse 100, temperature 98.9 F (37.2 C), temperature source Oral, resp. rate 17, SpO2 100 %. There is no height or weight on file to calculate BMI.  Treatment Plan Summary: Daily contact with patient to assess and evaluate symptoms and progress in treatment and Medication management  Diagnoses / Active Problems: Schizoaffective disorder ([place]) Principal Problem:   Schizoaffective disorder ([place]) Active Problems:   GAD (generalized anxiety disorder)   Vitamin D deficiency   Sialorrhea  Assessment: Patient is at baseline of fixed persecutory delusions, stable psychiatrically, and is mandated via court order to remain admitted for inpatient psychiatric admission until at least (09/29/22)  PLAN: Safety and Monitoring:  -- Voluntary admission to inpatient psychiatric unit for safety, stabilization and treatment  -- Daily contact with patient to assess and evaluate symptoms and progress in treatment  -- Patient's case to be discussed in multi-disciplinary team meeting  -- Observation Level : q15 minute checks  -- Vital  signs:  q12 hours  -- Precautions: suicide, elopement, and assault  2. Medications:              -- Continue clozapine 150 mg qhs - for psychosis, ANC wnl (10/31 - REMS updated to 2700), recheck every 7 days  Targeted review of symptoms, specific for clozapine: Malaise/Sedation: endorses feeling "a little tired" Chest pain: denies Shortness of breath: denies Exertional capacity: denies Tachycardia: denies Cough: denies Sore Throat: denies Fever: denies Orthostatic hypotension (dizziness with standing): denies Hypersalivation: "improved" Constipation: denies Nausea: denies Nocturnal enuresis: denies  Symptoms of GERD -Acid reflux: denies -Burning sensation in chest, usually after eating: denies -Trouble swallowing: denies -Sensation of lump in throat: denies -Regurgitation (backwash) of food or sour liquid: denies  -- Continue atropine drops 1% 1 drop tid for sialorrhea.  -- Continue Ritalin 10 mg qam - for daytime sedation 2/2 clozapine and negative symptoms of schizophrenia. -- Continue Zoloft 100 mg daily for depression -- Continue Norvasc 10 mg daily for hypertension -- Continue atenolol 12.5 mg once daily - for tachycardia 2/2 clozapine  --  Continue Colace 100 mg 2 twice daily for constipation -- Continue Miralax daily for constipation -- Continue Ensure TID for nutritional support -- Continue Ferrous Sulfate 325 mg daily for iron replacement -- Continue Atarax 25 mg TID PRN for anxiety -- Continue Metformin 500 mg XL for weight gain prophylaxis    -- Referred for sleep study at d/c for excessive somnolence, snoring, daytime fatigue              -- Previously discontinued 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  -- Previously discontinued geodon, caplyta, thorazine, zyprexa, risperdal due to no improvement (also concern for cheeking during previous antipsychotic medication trials)  -- Previously discontinued klonopin for anxiety associated with paranoid thoughts.  -- Completed -  Diflucan 150 mg po daily for yeast infection. -- Completed - Megace for AUB   The risks/benefits/side-effects/alternatives to the above medication were discussed in detail with the patient and time was given for questions. The patient consents to medication trial. FDA black box warnings, if present, were discussed.  The patient is agreeable with the medication plan, as above. We will monitor the patient's response to pharmacologic treatment, and adjust medications as necessary.  3. Routine and other pertinent labs:             -- Metabolic profile:  BMI: There is no height or weight on file to calculate BMI.  CBC    Component Value Date/Time   WBC 6.0 09/22/2022 1811   RBC 4.98 09/22/2022 1811   HGB 13.1 09/22/2022 1811   HGB 13.6 02/07/2020 1549   HCT 41.8 09/22/2022 1811   HCT 42.7 02/07/2020 1549   PLT 271 09/22/2022 1811   PLT 273 02/07/2020 1549   MCV 83.9 09/22/2022 1811   MCV 81 02/07/2020 1549   MCH 26.3 09/22/2022 1811   MCHC 31.3 09/22/2022 1811   RDW 14.2 09/22/2022 1811   RDW 13.4 02/07/2020 1549   LYMPHSABS 1.7 09/22/2022 1811   LYMPHSABS 1.9 12/05/2019 1530   MONOABS 0.4 09/22/2022 1811   EOSABS 0.1 09/22/2022 1811   EOSABS 0.3 12/05/2019 1530   BASOSABS 0.0 09/22/2022 1811   BASOSABS 0.0 12/05/2019 1530   Prolactin: Lab Results  Component Value Date   PROLACTIN 18.5 06/18/2022   PROLACTIN 58.3 (H) 06/06/2022    Lipid Panel: Lab Results  Component Value Date   CHOL 217 (H) 05/18/2022   TRIG 31 05/18/2022   HDL 71 05/18/2022   CHOLHDL 3.1 05/18/2022   VLDL 6 05/18/2022   LDLCALC 140 (H) 05/18/2022   LDLCALC 94 05/02/2021    HbgA1c: Hgb A1c MFr Bld (%)  Date Value  05/18/2022 5.2    TSH: TSH (uIU/mL)  Date Value  06/18/2022 0.964  02/26/2015 1.352    EKG monitoring: QTc: 450 (8/28)  Lab Results  Component Value Date   NEUTROABS 3.7 09/22/2022   Repeat CBC w/ differential 2700 (10/31)  4. Group Therapy:             -- Encouraged  patient to participate in unit milieu and in scheduled group therapies              -- Short Term Goals: Ability to identify changes in lifestyle to reduce recurrence of condition will improve, Ability to verbalize feelings will improve, Ability to demonstrate self-control will improve, Ability to identify and develop effective coping behaviors will improve, Ability to maintain clinical measurements within normal limits will improve, and Ability to identify triggers associated with substance abuse/mental health issues will improve             --  Long Term Goals: Improvement in symptoms so as ready for discharge -- Patient is encouraged to participate in group therapy while admitted to the psychiatric unit. -- We will address other chronic and acute stressors, which contributed to the patient's Schizoaffective disorder ([place]) in order to reduce the risk of self-harm at discharge.  5. Discharge Planning:   -- Social work and case management to assist with discharge planning and identification of hospital follow-up needs prior to discharge            -- Estimated LOS: dc not before 09-29-22 per court  -- Discharge Concerns: Need to establish a safety plan; Medication compliance and effectiveness  -- Discharge Goals: Return home with outpatient referrals for mental health follow-up including medication management/psychotherapy  I certify that inpatient services furnished can reasonably be expected to improve the patient's condition.    I discussed my assessment, planned testing and intervention for the patient with Dr. Caswell Corwin who agrees with my formulated course of action.   Camelia Phenes, MD, 09/29/2022, 6:58 AM

## 2022-09-29 NOTE — Progress Notes (Signed)
[facility] MD Progress Note  09/30/2022 10:48 AM Cheryl Adams  MRN:  [id number]  Principal Problem: Schizoaffective disorder (HCC) Diagnosis: Principal Problem:   Schizoaffective disorder (HCC) Active Problems:   GAD (generalized anxiety disorder)   Vitamin D deficiency   Sialorrhea  Reason for Admission:  Cheryl Adams is a 31 year old African-American female with prior psychiatric diagnoses of schizoaffective d/o depressive type and GAD who initially presented to the [facility] health urgent care [facility]) accompanied by her mother with complaints of paranoia. She was previously admitted to [facility] from 6/20-10/10/2022 and treated for schizoaffective disorder, depressive type with multiple medication trials (geodon, haldol, caplyta, thorazine, zyprexa, risperdal) and ultimately had partial response to clozapine. DSS was granted legal gaurdianship. On 09/10/2022, pt was d/c for competency evaluation per court. On 09/10/22, patient was readmitted to the psych unit after the evaluation. Admitted on 09/10/2022, total  LOS: 20 days   Chart Review from last 24 hours:  The patient's chart was reviewed and nursing notes were reviewed. The patient's case was discussed in multidisciplinary team meeting.   - Overnight events to report per chart review: Patient still being considered at group home. Did not attent RT group. Inattentive and lethargic during wrap up group.  - Patient received all scheduled medications - Patient did not receive any PRN medications  Per social worker at team meeting, patient appeared to be responding to internal stimuli during court meeting as evidenced by intermittently nodding her head when no one was speaking to her.   Information Obtained Today During Patient Interview: The patient was seen and evaluated on the unit. On assessment today the patient reports she is doing fair. Asked about court hearing, she reports it was "fine." She continues to endorse somnolence, worse  earlier in the day. Asked if ritalin before helped, she denies. She denies auditory or visual hallucinations. When asked if she is paranoid that people are going to hurt her she says "some." She is not willing to elaborate on who. She endorses feeling safe in the hospital. She denies SI/HI. She reported sialorrhea has improved, she does not notice when she does have sialorrhea.    Patient endorses fair sleep; endorses good appetite.  Patient endorses somnolence.   Past Psychiatric History: MDD, GAD, ultimately diagnosed with schizoaffective disorder depressive type  Past Medical History:  Past Medical History:  Diagnosis Date   Anemia    Chronic tonsillitis 10/2014   Cough 11/09/2014   Difficulty swallowing pills    No psychiatric disorder found after evaluation 04/14/2022   Family History: History reviewed. No pertinent family history. Family Psychiatric History: Schizophrenia- Maternal grandfather.  Social History: No alcohol or drug use   Current Medications: Current Facility-Administered Medications  Medication Dose Route Frequency Provider Last Rate Last Admin   acetaminophen (TYLENOL) tablet 650 mg  650 mg Oral Q6H PRN Massengill, Nathan, MD       alum & mag hydroxide-simeth (MAALOX/MYLANTA) 200-200-20 MG/5ML suspension 30 mL  30 mL Oral Q4H PRN Massengill, Nathan, MD       amLODipine (NORVASC) tablet 10 mg  10 mg Oral Daily Massengill, Nathan, MD   10 mg at 09/30/22 6440   antiseptic oral rinse (BIOTENE) solution 15 mL  15 mL Mouth Rinse 5 X Daily PRN Massengill, Harrold Donath, MD       atenolol (TENORMIN) tablet 12.5 mg  12.5 mg Oral Daily Massengill, Nathan, MD   12.5 mg at 09/30/22 0822   atropine 1 % ophthalmic solution 1 drop  1  drop Sublingual TID Phineas InchesMassengill, Nathan, MD   1 drop at 09/30/22 0820   cloZAPine (CLOZARIL) tablet 150 mg  150 mg Oral QHS Leevy-Johnson, Brooke A, NP   150 mg at 09/29/22 2135   docusate sodium (COLACE) capsule 100 mg  100 mg Oral BID Phineas InchesMassengill, Nathan,  MD   100 mg at 09/30/22 [id number]   ferrous sulfate tablet 325 mg  325 mg Oral Daily Massengill, Harrold DonathNathan, MD   325 mg at 09/30/22 0825   magnesium hydroxide (MILK OF MAGNESIA) suspension 30 mL  30 mL Oral Daily PRN Massengill, Harrold DonathNathan, MD       metFORMIN (GLUCOPHAGE-XR) 24 hr tablet 500 mg  500 mg Oral Q breakfast Massengill, Harrold DonathNathan, MD   500 mg at 09/30/22 [id number]   modafinil (PROVIGIL) tablet 100 mg  100 mg Oral Daily Massengill, Harrold DonathNathan, MD   100 mg at 09/30/22 1031   polyethylene glycol (MIRALAX / GLYCOLAX) packet 17 g  17 g Oral Daily Massengill, Harrold DonathNathan, MD   17 g at 09/30/22 0821   [START ON 10/01/2022] sertraline (ZOLOFT) tablet 100 mg  100 mg Oral QHS Karie Fetchhien, Eyleen Rawlinson, MD       vitamin D3 (CHOLECALCIFEROL) tablet 1,000 Units  1,000 Units Oral Daily Massengill, Harrold DonathNathan, MD   1,000 Units at 09/30/22 [id number]    Lab Results:  Results for orders placed or performed during the hospital encounter of 09/10/22 (from the past 48 hour(s))  CBC with Differential/Platelet     Status: Abnormal   Collection Time: 09/29/22  6:47 AM  Result Value Ref Range   WBC 4.6 4.0 - 10.5 K/uL   RBC 4.76 3.87 - 5.11 MIL/uL   Hemoglobin 12.2 12.0 - 15.0 g/dL   HCT 86.539.6 78.436.0 - 69.646.0 %   MCV 83.2 80.0 - 100.0 fL   MCH 25.6 (L) 26.0 - 34.0 pg   MCHC 30.8 30.0 - 36.0 g/dL   RDW 29.514.2 28.411.5 - 13.215.5 %   Platelets 236 150 - 400 K/uL   nRBC 0.0 0.0 - 0.2 %   Neutrophils Relative % 61 %   Neutro Abs 2.7 1.7 - 7.7 K/uL   Lymphocytes Relative 29 %   Lymphs Abs 1.3 0.7 - 4.0 K/uL   Monocytes Relative 7 %   Monocytes Absolute 0.3 0.1 - 1.0 K/uL   Eosinophils Relative 3 %   Eosinophils Absolute 0.2 0.0 - 0.5 K/uL   Basophils Relative 0 %   Basophils Absolute 0.0 0.0 - 0.1 K/uL   Immature Granulocytes 0 %   Abs Immature Granulocytes 0.02 0.00 - 0.07 K/uL    Comment: Performed at [facility], [address] [place], [place] [id number]    Blood Alcohol level:  Lab Results  Component Value Date   [facility]  [facility]   ETH <10 04/24/2021    Metabolic Labs: Lab Results  Component Value Date   HGBA1C 5.2 05/18/2022   MPG 102.54 05/18/2022   MPG 120 04/29/2021   Lab Results  Component Value Date   PROLACTIN 18.5 06/18/2022   PROLACTIN 58.3 (H) 06/06/2022   Lab Results  Component Value Date   CHOL 217 (H) 05/18/2022   TRIG 31 05/18/2022   HDL 71 05/18/2022   CHOLHDL 3.1 05/18/2022   VLDL 6 05/18/2022   LDLCALC 140 (H) 05/18/2022   LDLCALC 94 05/02/2021    Sleep: 7.5 hours   Physical Findings: AIMS: No cogwheeling or stiffness noted on exam.   Mental Status Exam:  Appearance and Grooming: Patient  is  dressed in scrubs and wearing her glasses . Her hair is up in braids on her head. The patient has no noticeable scent or odor. No sialorrhea was noted.   Behavior: The patient appears in no acute distress, and during the interview, was calm; she was compliant to requests and made minimal eye contact.   The patient did not appear internally or externally preoccupied.  Attitude: Patient's attitude towards the interviewer was guarded, as evidenced by unwillingness to disclose who she was paranoid about .  Motor activity: The patient's movement speed was normal; her gait was not observed during encounter. There was no notable abnormal facial movements and no notable abnormal extremity movements.  Speech: The patient's speech was clear, fluent, and with good articulation. The volume of her speech was soft and sparse in quantity. The rate was normal with a normal rhythm. Responses were normal in latency. There were no abnormal patterns in speech. She had minimal speech.   Mood: "Fine"  Affect: Patient's affect is flat with restricted range and even fluctuations; her affect is congruent with her stated mood. -------------------------------------------------------------------------------------------------------------------------  Thought Content The patient experiences no  hallucinations. The patient describes no delusional thoughts; she denies thought insertion, denies thought withdrawal, denies thought interruption, and denies thought broadcasting. She endorses paranoia but does not go into detail re her paranoia.   Patient at the time of interview denies active suicidal intent and denies passive suicidal ideation; she denies homicidal intent.  Thought Process The patient's thought process is concrete, as evidenced by yes, no, and one-word answers to questions . No spontaneity of speech.   Insight The patient at the time of interview demonstrates poor insight, as evidenced by understanding of mental health condition/s, ability to identify trigger/s causing mental health decompensation, and lacking understanding of mental health condition/s.  Judgement The patient over the past 24 hours demonstrates poor judgement, as evidenced by minimally engaging with staff / other patients.  Physical Exam Constitutional:      Appearance: the patient is not toxic-appearing.  Pulmonary:     Effort: Pulmonary effort is normal.  Neurological:     General: No focal deficit present.     Mental Status: the patient is alert and oriented to person and place.  Review of Systems  Respiratory:  Negative for shortness of breath.   Cardiovascular:  Negative for chest pain.  Gastrointestinal:  Negative for abdominal pain, constipation, diarrhea, nausea and vomiting. Positive for sialorrhea.  Neurological:  Negative for headaches.   Targeted review of symptoms, specific for clozapine: Malaise/Sedation: lethargic  Chest pain: denies Shortness of breath: denies Exertional capacity: denies Tachycardia: slight elevation noted on vitals Cough: denies Sore Throat: denies Fever: not noted on vitals  Orthostatic hypotension (dizziness with standing): denies Hypersalivation: "improved" Constipation: denies. Last BM was yesterday.  Nausea: denies Nocturnal enuresis: denies    Symptoms of GERD -Acid reflux: denies -Burning sensation in chest, usually after eating: denies -Trouble swallowing: denies -Sensation of lump in throat: denies -Regurgitation (backwash) of food or sour liquid: denies  Blood pressure 104/82, pulse (!) 102, temperature 98.9 F (37.2 C), temperature source Oral, resp. rate 20, SpO2 100 %. There is no height or weight on file to calculate BMI.  Assets  Assets: Physical Health; Resilience   Treatment Plan Summary: Daily contact with patient to assess and evaluate symptoms and progress in treatment and Medication management  Diagnoses / Active Problems: Schizoaffective disorder ([place]) Principal Problem:   Schizoaffective disorder ([place]) Active Problems:  GAD (generalized anxiety disorder)   Vitamin D deficiency   Sialorrhea  Assessment Kamiryn Bezanson is a 31 year old African-American female with prior psychiatric diagnoses of schizoaffective d/o depressive type and GAD who was mandated via court order to remain admitted for inpatient psychiatric admission until at least (09/29/22). Currently awaiting placement at group home. Now with increased paranoia on lower dose of clozaril as well as continued sedation which is likely side effect of clozaril.   PLAN: Safety and Monitoring:  -- Voluntary admission to inpatient psychiatric unit for safety, stabilization and treatment  -- Daily contact with patient to assess and evaluate symptoms and progress in treatment  -- Patient's case to be discussed in multi-disciplinary team meeting  -- Observation Level : q15 minute checks  -- Vital signs:  q12 hours  -- Precautions: suicide, elopement, and assault  2. Medications:  #Schizoaffective disorder, bipolar type Child psychotherapist noted possibly responding to internal stimuli during hearing yesterday. She continues to endorse sedation as well after discontinuation of ritalin over the weekend. Clozapine has been decreased in the setting of daytime  sedation. Most recent trough level subtherapeutic (129) and currently below target level. Will continue to monitor for now but will consider increasing to 175-200mg  tomorrow given worsening psychosis. Will add modafinil and change zoloft to nighttime given ongoing sedation. -- Continue clozapine 150 mg qhs - for psychosis, ANC wnl (10/31). Next check 11/7.  -- Continue atropine drops 1% 1 drop tid for sialorrhea.  -- START modafinil 100mg  qam - for daytime sedation 2/2 clozapine and negative symptoms of schizophrenia. -- CHANGE Zoloft to 100 mg QHS for depression -- Would repeat EKG if tachycardia persists   #HTN -- Continue Norvasc 10 mg daily for hypertension  #Tachycardia HR slightly elevated this AM. She denies palpitations.  - Continue atenolol 12.5 mg daily - for tachycardia 2/2 clozapine  - Continue to monitor, low threshold to obtain EKG   #Constipation Most recent BM yesterday.  -- Continue Colace 100 mg 2 twice daily for constipation -- Continue Miralax daily for constipation  #Nutritional needs  -- Continue Ensure TID for nutritional support  #Iron deficiency  -- Continue Ferrous Sulfate 325 mg daily for iron replacement  #Weight gain Ppx -- Continue Metformin 500 mg XL for weight gain prophylaxis  #Anxiety -- Continue Atarax 25 mg TID PRN for anxiety  #Hyperlipidemia  Lipid panel in 04/2021 with elevated total cholesterol (217) and LDL (140). Given her young age, calculated lifetime ASCVD risk is 39%. Would continue to recommend lifestyle interventions for now but would continue to monitor her lipid panel. Low threshold to start statin given high side effect burden of weight gain and dyslipidemia on clozapine.  -would obtain repeat lipid panel on follow-up  The risks/benefits/side-effects/alternatives to the above medication were discussed in detail with the patient and time was given for questions. The patient consents to medication trial. FDA black box warnings, if  present, were discussed.  The patient is agreeable with the medication plan, as above. We will monitor the patient's response to pharmacologic treatment, and adjust medications as necessary.  3. Routine and other pertinent labs:             -- Metabolic profile:  BMI: There is no height or weight on file to calculate BMI.  Prolactin: Lab Results  Component Value Date   PROLACTIN 18.5 06/18/2022   PROLACTIN 58.3 (H) 06/06/2022    Lipid Panel: Lab Results  Component Value Date   CHOL 217 (H) 05/18/2022  TRIG 31 05/18/2022   HDL 71 05/18/2022   CHOLHDL 3.1 05/18/2022   VLDL 6 05/18/2022   LDLCALC 140 (H) 05/18/2022   LDLCALC 94 05/02/2021    HbgA1c: Hgb A1c MFr Bld (%)  Date Value  05/18/2022 5.2    TSH: TSH (uIU/mL)  Date Value  06/18/2022 0.964  02/26/2015 1.352    EKG monitoring: QTc: 450 (8/28)  4. Group Therapy:  -- Encouraged patient to participate in unit milieu and in scheduled group therapies   -- Short Term Goals: Ability to identify changes in lifestyle to reduce recurrence of condition will improve, Ability to verbalize feelings will improve, Ability to demonstrate self-control will improve, Ability to identify and develop effective coping behaviors will improve, and Ability to maintain clinical measurements within normal limits will improve  -- Long Term Goals: Improvement in symptoms so as ready for discharge -- Patient is encouraged to participate in group therapy while admitted to the psychiatric unit. -- We will address other chronic and acute stressors, which contributed to the patient's Schizoaffective disorder (HCC) in order to reduce the risk of self-harm at discharge.  5. Discharge Planning:   -- Social work and case management to assist with discharge planning and identification of hospital follow-up needs prior to discharge  -- Estimated LOS: 5-7 days  -- Discharge Concerns: Need to establish a safety plan; Medication compliance and  effectiveness  -- Discharge Goals: Return home with outpatient referrals for mental health follow-up including medication management/psychotherapy  I certify that inpatient services furnished can reasonably be expected to improve the patient's condition.    I discussed my assessment, planned testing and intervention for the patient with Dr. Sherron Flemings who agrees with my formulated course of action.  Karie Fetch, MD, PGY-1 09/30/2022, 10:48 AM

## 2022-09-29 NOTE — Plan of Care (Signed)
Nurse discussed coping skills with patient.  

## 2022-09-29 NOTE — Progress Notes (Signed)
Adult Psychoeducational Group Note  Date:  09/29/2022 Time:  9:35 PM  Group Topic/Focus:  Wrap-Up Group:   The focus of this group is to help patients review their daily goal of treatment and discuss progress on daily workbooks.  Participation Level:  Minimal  Participation Quality:  Drowsy and Inattentive  Affect:  Lethargic  Cognitive:  Lacking  Insight: Lacking  Engagement in Group:  Lacking  Modes of Intervention:  Discussion, Education, and Support  Additional Comments:   Pt attended the Wrap  Up group. Pt denied SI/HI/AVH. Pt was inattentive and lethargic during group. Pt sat with her head down and eyes closed most of the group. Writer conducted a wellness check. Pt assured she was ok just sleepy. Pt rated her day 4/10. Pt reported no current goals. Pt shared she started exercising to improve coping.  [name] Charese Abundis 09/29/2022, 9:35 PM

## 2022-09-29 NOTE — Progress Notes (Signed)
DSS Supervisor, Kevin Fenton shared on a call this morning that patient is still being considered at one of the [facility] in [place] a decision soon.

## 2022-09-29 NOTE — BHH Counselor (Signed)
CSW and the Pt attended Guardianship Court through webex at 11:00am today.  The [name] Guardian Ad Litem Jamas Lav), Legal Guardian through DSS Devoria Albe), and mother Jerome Otter) were also present for the hearing.  The [place] Reeks found the Pt to be incompetent at this time and awarded guardianship to [place].  CSW spoke with the Pt after court to see how she felt about the court ruling.  The Pt stated "I just want to have somewhere safe to live and be able to leave the hospital".  CSW assured the Pt that her legal guardian and the hospital staff are working on that goal for her.  During court Mrs. Ouida Sills states that they have not been able to secure a group home at this time and are continuing to work on getting Medicaid and Social Security for the Pt.  CSW will provide updates as they are received.

## 2022-09-29 NOTE — Progress Notes (Signed)
   09/29/22 2130  Psych Admission Type (Psych Patients Only)  Admission Status Voluntary  Psychosocial Assessment  Patient Complaints Anxiety;Sadness  Eye Contact Brief  Facial Expression Flat  Affect Appropriate to circumstance  Speech Soft;Logical/coherent  Interaction Childlike  Motor Activity Slow  Appearance/Hygiene Unremarkable  Behavior Characteristics Appropriate to situation  Mood Anxious  Thought Process  Coherency WDL  Content WDL  Delusions None reported or observed  Perception WDL  Hallucination None reported or observed  Judgment Impaired  Confusion None  Danger to Self  Current suicidal ideation? Denies  Agreement Not to Harm Self Yes  Description of Agreement Verbal contract for safety   Patient alert and oriented. Presenting with an anxious mood and sadness. Patient denies SI, HI, AVH, and pain. Scheduled medication administered to patient, per provider orders. Support and encouragement provided. Routine safety checks conducted every 15 minutes. Patient contracts for safety and remains safe on the unit.

## 2022-09-29 NOTE — Hospital Course (Addendum)
Cheryl Adams is a 31 year old African-American female with prior psychiatric diagnoses of schizoaffective d/o depressive type and GAD who initially presented to the [facility] health urgent care [facility]) accompanied by her mother with complaints of paranoia. She was previously admitted to [facility] from 6/20-10/10/2022 and treated for schizoaffective disorder, depressive type with multiple medication trials (geodon, haldol, caplyta, thorazine, zyprexa, risperdal) and ultimately had partial response to clozapine. DSS was granted legal gaurdianship. On 09/10/2022, pt was d/c for competency evaluation per court. On 09/10/22, patient was readmitted to the psych unit after the evaluation.   #Schizoaffective disorder, depressive type  -Previously discontinued 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  -Previously discontinued geodon, caplyta, thorazine, zyprexa, risperdal due to no improvement (also concern for cheeking during previous antipsychotic medication trials)  -Previously discontinued klonopin for anxiety associated with paranoid thoughts. -On re-admission, she was continued on clozaril 200mg  QHS for psychosis. She continued to have paranoid delusional thoughts that her family was out to harm her. ANC was monitored q 7 days and wnl (10/17, 10/24, 10/31). 10/29 Clozaril level was subtherapeutic (129). She was monitored for BM. Clozapine was decreased to 175mg  QHS to address ongoing sialorrhea and daytime fatigue. Clozapine was then decreased again to 150mg  due to excessive daytime sedation. She tolerated lower dose of clozapine but did not report it was significantly helpful for daytime sedation. Given increased paranoia with lower dose of clozaril, she was increased back to 200mg  QHS.  -She was initially on ritalin 10mg  qAM for daytime sedation 2/2 clozapine. This was discontinued due to concern for cardiac side effect and patient report that it did not help with alertness.  -She was started on modafinil to address  continued sedation even with decreased dose of clozaril.  -She was initially on zoloft 100mg  daily for depression.  -Guardianship court hearing was on 10/31.   -zoloft to 125mg  QAM for MDD and GAD  -Modafinil was discontinued due to worsening psychosis  -Clozaril increased to 250mg  QHS   #Sialorrhea Side effect of clozapine. She was continued on atropine drops TID.  #HTN She was continued on norvasc 10mg  daily  #Iron deficiency  She was continued on ferrous sulfate 325mg  daily   #Tachycardia  Suspected to be side effect of clozapine. She was continued on atenolol 12.5mg  once daily  #Constipation She was continued on miralax and colace   #Metabolic syndrome ppx She was continued on metformin 500mg  XL.   #Hyperlipidemia  Lipid panel in 04/2021 with elevated total cholesterol (217) and LDL (140). Given her young age, calculated lifetime ASCVD risk is 39%. Would continue to recommend lifestyle interventions for now but would continue to monitor her lipid panel. Low threshold to start statin given high side effect burden of weight gain and dyslipidemia on clozapine.  -would obtain repeat lipid panel on follow-up  #Yeast infection  She completed a course of diflucan.   #AUB  She completed a course of megace.     [ ]  Discharge:   -- Referred for sleep study for excessive somnolence, snoring, daytime fatigue

## 2022-09-30 DIAGNOSIS — F251 Schizoaffective disorder, depressive type: Secondary | ICD-10-CM | POA: Diagnosis not present

## 2022-09-30 MED ORDER — MODAFINIL 100 MG PO TABS
100.0000 mg | ORAL_TABLET | Freq: Every day | ORAL | Status: DC
  Administered 2022-09-30 – 2022-10-05 (×6): 100 mg via ORAL
  Filled 2022-09-30 (×6): qty 1

## 2022-09-30 MED ORDER — SERTRALINE HCL 100 MG PO TABS
100.0000 mg | ORAL_TABLET | Freq: Every day | ORAL | Status: DC
  Administered 2022-10-01 – 2022-10-04 (×4): 100 mg via ORAL
  Filled 2022-09-30 (×8): qty 1

## 2022-09-30 NOTE — Progress Notes (Signed)
[place] Group Notes:  (Nursing/MHT/Case Management/Adjunct)  Date:  09/30/2022  Time:  2000  Type of Therapy:   wrap up group  Participation Level:  Active  Participation Quality:  Appropriate, Attentive, Sharing, and Supportive  Affect:  {BHH AFFECT:22266}  Cognitive:  {BHH COGNITIVE:22267}  Insight:  {BHH Insight2:20797}  Engagement in Group:  {BHH ENGAGEMENT IN EFEOF:12197}  Modes of Intervention:  {BHH MODES OF INTERVENTION:22269}  Summary of Progress/Problems:  Shellia Cleverly 09/30/2022, 9:12 PM

## 2022-09-30 NOTE — Plan of Care (Signed)
  Problem: Coping: Goal: Ability to verbalize frustrations and anger appropriately will improve Outcome: Progressing   Problem: Coping: Goal: Ability to demonstrate self-control will improve Outcome: Progressing   Problem: Safety: Goal: Periods of time without injury will increase Outcome: Progressing   Problem: Health Behavior/Discharge Planning: Goal: Compliance with therapeutic regimen will improve Outcome: Progressing

## 2022-09-30 NOTE — Progress Notes (Signed)
Adult Psychoeducational Group Note  Date:  09/30/2022 Time:  11:33 AM  Group Topic/Focus:  Orientation:   The focus of this group is to educate the patient on the purpose and policies of crisis stabilization and provide a format to answer questions about their admission.  The group details unit policies and expectations of patients while admitted.  Participation Level:  Active  Participation Quality:  Appropriate  Affect:  Appropriate  Cognitive:  Appropriate  Insight: Appropriate  Engagement in Group:  Engaged  Modes of Intervention:  Discussion  Additional Comments:  Patient  attended morning orientation group and participated.  Kiegan Macaraeg W Willistine Ferrall 70/05/2375, 11:33 AM

## 2022-09-30 NOTE — Group Note (Signed)
LCSW Group Therapy Note   Group Date: 09/30/2022 Start Time: 1300 End Time: 1400  Type of Therapy: Group Therapy: Boundaries  Participation Level: Active  Description of Group: This group will address the use of boundaries in their personal lives. Patients will explore why boundaries are important, the difference between healthy and unhealthy boundaries, and negative and positive outcomes of different boundaries and will look at how boundaries can be crossed.  Patients will be encouraged to identify current boundaries in their own lives and identify what kind of boundary is being set. Facilitators will guide patients in utilizing problem-solving interventions to address and correct types boundaries being used and to address when no boundary is being used. Understanding and applying boundaries will be explored and addressed for obtaining and maintaining a balanced life. Patients will be encouraged to explore ways to assertively make their boundaries and needs known to significant others in their lives, using other group members and facilitator for role play, support, and feedback.   Therapeutic Goals: 1. Patient will identify areas in their life where setting clear boundaries could be used to improve their life.  2. Patient will identify signs/triggers that a boundary is not being respected. 3. Patient will identify two ways to set boundaries in order to achieve balance in their lives: 4. Patient will demonstrate ability to communicate their needs and set boundaries through discussion and/or role plays   Summary of Progress/Problems: The Pt attended group and remained there the entire time.  The Pt accepted all worksheets and was open to discussion with their peers.  The Pt was appropriate and was able to identify areas in their life where they have had difficulties with their boundaries and ways that they can improve their boundaries moving forward.    Shahmeer Bunn M Paydon Carll, LCSWA 09/30/2022  2:17 PM     

## 2022-09-30 NOTE — Group Note (Signed)
Recreation Therapy Group Note   Group Topic:Personal Development  Group Date: 09/30/2022 Start Time: 4132 End Time: 1435 Facilitators: Evelina Lore-McCall, LRT,CTRS Location: [address] Dayroom   Activity Description/Intervention: Therapeutic Drumming. Patients with peers and staff were given the opportunity to engage in a leader facilitated [place] with staff from the [employer], in partnership with [employer]. Nurse, adult and trained [employer], Devin Going leading with LRT observing and documenting intervention and pt response. This evidenced-based practice targets 7 areas of health and wellbeing in the human experience including: stress-reduction, exercise, self-expression, camaraderie/support, nurturing, spirituality, and music-making (leisure).   Goal Area(s) Addresses:  Patient will engage in pro-social way in music group.  Patient will follow directions of drum leader on the first prompt. Patient will demonstrate no behavioral issues during group.  Patient will identify if a reduction in stress level occurs as a result of participation in therapeutic drum circle.     Affect/Mood: Anxious   Participation Level: Engaged   Participation Quality: Independent   Behavior: Appropriate   Speech/Thought Process: Focused    Clinical Observations/Individualized Feedback:  Patient actively engaged in therapeutic drumming exercise and discussions. Pt was appropriate with peers, staff, and musical equipment for duration of programming.   Plan: Continue to engage patient in RT group sessions 2-3x/week.   Yuepheng Schaller-McCall, LRT,CTRS 09/30/2022 3:42 PM

## 2022-09-30 NOTE — Progress Notes (Signed)
   09/30/22 1007  Psych Admission Type (Psych Patients Only)  Admission Status Voluntary  Psychosocial Assessment  Patient Complaints None  Eye Contact Brief  Facial Expression Flat  Affect Appropriate to circumstance  Speech Soft;Logical/coherent  Interaction Childlike  Motor Activity Slow  Appearance/Hygiene Unremarkable  Behavior Characteristics Appropriate to situation  Mood Pleasant  Thought Process  Coherency WDL  Content WDL  Delusions None reported or observed  Perception WDL  Hallucination None reported or observed  Judgment Impaired  Confusion None  Danger to Self  Current suicidal ideation? Denies  Self-Injurious Behavior No self-injurious ideation or behavior indicators observed or expressed   Agreement Not to Harm Self Yes  Description of Agreement verbal  Danger to Others  Danger to Others None reported or observed

## 2022-09-30 NOTE — Progress Notes (Signed)
   09/30/22 2121  Psych Admission Type (Psych Patients Only)  Admission Status Voluntary  Psychosocial Assessment  Patient Complaints Anxiety;Sadness  Eye Contact Brief  Facial Expression Flat  Affect Appropriate to circumstance  Speech Soft;Logical/coherent  Interaction Childlike  Motor Activity Slow  Appearance/Hygiene Unremarkable  Behavior Characteristics Appropriate to situation  Mood Pleasant  Thought Process  Coherency WDL  Content WDL  Delusions None reported or observed  Perception WDL  Hallucination None reported or observed  Judgment Impaired  Confusion None  Danger to Self  Current suicidal ideation? Denies  Agreement Not to Harm Self Yes  Description of Agreement Verbal   Patient alert and oriented. Presenting appropriate to circumstance with a pleasant mood. Patient denies SI, HI, AVH, and pain. Patient replies "some" when asked about anxious thoughts and feelings of sadness. Scheduled medications administered to patient, per provider orders. Support and encouragement provided. Routine safety checks conducted every 15 minutes. Patient calm, receptive and cooperative. Patient contracts for safety and remains safe on the unit.

## 2022-10-01 DIAGNOSIS — F251 Schizoaffective disorder, depressive type: Secondary | ICD-10-CM | POA: Diagnosis not present

## 2022-10-01 MED ORDER — CLOZAPINE 25 MG PO TABS
175.0000 mg | ORAL_TABLET | Freq: Every day | ORAL | Status: DC
  Administered 2022-10-01: 175 mg via ORAL
  Filled 2022-10-01 (×3): qty 3

## 2022-10-01 NOTE — Progress Notes (Signed)
D:  Patient denied SI and HI, contracts for safety.  Denied A/V hallucinations.  Denied pain. A:  Medications administered per MD orders.  Emotional support and encouragement given patient. R:  Safety maintained with 15 minutes checks.

## 2022-10-01 NOTE — Progress Notes (Signed)
   10/01/22 2000  Psych Admission Type (Psych Patients Only)  Admission Status Voluntary  Psychosocial Assessment  Patient Complaints Anxiety  Eye Contact Brief  Facial Expression Flat  Affect Appropriate to circumstance  Speech Logical/coherent  Interaction Childlike  Motor Activity Slow  Appearance/Hygiene Unremarkable  Behavior Characteristics Appropriate to situation;Cooperative  Mood Pleasant  Thought Process  Coherency WDL  Content WDL  Delusions None reported or observed  Perception WDL  Hallucination None reported or observed  Judgment Impaired  Confusion None  Danger to Self  Current suicidal ideation? Denies  Agreement Not to Harm Self Yes  Description of Agreement Verbally contracted for safety  Danger to Others  Danger to Others None reported or observed

## 2022-10-01 NOTE — Plan of Care (Signed)
Nurse discussed coping skills with patient.  

## 2022-10-01 NOTE — Progress Notes (Signed)
[facility] MD Resident Progress Note  10/01/2022 2:27 PM [name]  MRN:  [id number] Principal Problem: Schizoaffective disorder [facility]) Diagnosis: Principal Problem:   Schizoaffective disorder ([place]) Active Problems:   GAD (generalized anxiety disorder)   Vitamin D deficiency   Sialorrhea  Reason for admission   Cheryl Adams is a 31 year old African-American female with prior psychiatric diagnoses of schizoaffective d/o depressive type and GAD who initially presented to the [facility] health urgent care [facility]) accompanied by her mother with complaints of paranoia. She was previously admitted to [facility] from 6/20-10/10/2022 and treated for schizoaffective disorder, depressive type with multiple medication trials (geodon, haldol, caplyta, thorazine, zyprexa, risperdal) and ultimately had partial response to clozapine. DSS was granted legal gaurdianship. On 09/10/2022, pt was d/c for competency evaluation per court. On 09/10/22, patient was readmitted to the psych unit after the evaluation (admitted on 09/10/2022, total  LOS: 21 days )  Chart review from last 24 hours   The patient's chart was reviewed and nursing notes were reviewed. The patient's case was discussed in multidisciplinary team meeting.  - Overnight events per chart review: Continued anxious thoughts and feelings of sadness. Attended all groups.  - Patient received all scheduled meds  - Patient did not receive any PRN meds   Information obtained during interview   The patient was seen and evaluated on the unit. On assessment today, patient endorses good sleep; endorses good appetite. Patient reports improved sialorrhea from clozapine side effects. She reports last BM yesterday. Patient denies somatic symptoms. She reports paranoia "sometimes" while she's in the hospital and outside the hospital. She is unable to specify who. She reports that she did feel like she had less paranoia prior. She "feels OK" about guardianship. She  "wants to feel safe and secure" which she feels in the hospital. She is nervous about leaving the hospital.    Review of Systems  Respiratory:  Negative for shortness of breath.   Cardiovascular:  Negative for chest pain.  Gastrointestinal:  Negative for abdominal pain, constipation, diarrhea, nausea and vomiting.  Neurological:  Negative for headaches.   Targeted review of symptoms, specific for clozapine: Malaise/Sedation: improved Chest pain: denies Shortness of breath: denies Exertional capacity: denies Tachycardia: none noted on vitals Cough: denies Sore Throat: denies Fever: none noted on vitals  Orthostatic hypotension (dizziness with standing): denies Hypersalivation: better Constipation: denies. Last BM was yesterday.  Nausea: denies Nocturnal enuresis: denies   Symptoms of GERD -Acid reflux: denies -Burning sensation in chest, usually after eating: denies -Trouble swallowing: denies -Sensation of lump in throat: denies -Regurgitation (backwash) of food or sour liquid: denies  Objective   Blood pressure 118/75, pulse 100, temperature 99.4 F (37.4 C), temperature source Oral, resp. rate 20, SpO2 100 %. There is no height or weight on file to calculate BMI.  Sleep: 7.75 hours  Current Medications: Amlodipine 10mg  daily  Atenolol 12.5 daily Clozaril 150 QHS  Colace 100 BID  Iron 325 Daily  Metformin 500 daily  Modafinil 100mg  daily  Miralax 100mg  daily  Zoloft 100mg  QHS  Vitamin D3    Physical Exam Constitutional:      Appearance: the patient is not toxic-appearing.  Pulmonary:     Effort: Pulmonary effort is normal.  Neurological:     General: No focal deficit present.     Mental Status: the patient is alert and oriented to person, building, and time. She is not oriented to city.   AIMS:  Facial and Oral Movements Muscles of Facial  Expression: None, normal Lips and Perioral Area: None, normal Jaw: None, normal Tongue: None, normal,Extremity  Movements Upper (arms, wrists, hands, fingers): None, normal Lower (legs, knees, ankles, toes): None, normal Trunk Movements: None, normal  Neck, shoulders, hips: None, normal Overall Severity Severity of abnormal movements (highest score from questions above): None, normal Incapacitation due to abnormal movements: None, normal Patient's awareness of abnormal movements (rate only patient's report): No Awareness, Dental Status Current problems with teeth and/or dentures?: No Does patient usually wear dentures?: No   No stiffness, cogwheeling, or tremors noted on exam.   Mental Status Exam   Appearance and Grooming: Patient is dressed in scrubs and her hair appears uncombed. She is wearing glasses. Initially patient head leaned against the wall but as interview progressed, patient sat upright. The patient has no noticeable scent or odor. Motor activity: The patient's movement speed was normal; her gait was not observed during encounter. There was no notable abnormal facial movements and no notable abnormal extremity movements. Behavior: The patient appears in no acute distress, and during the interview, was calm, focused, required minimal redirection, and behaving appropriately to scenario; she was able to follow commands and compliant to requests and made minimal eye contact. The patient appeared internally preoccupied, as evidenced by minimal eye contact and occasionally glancing when discussing paranoia . Attitude: Patient's attitude towards the interviewer was cooperative and open. Speech: The patient's speech was fluent and with good articulation. The volume of her speech was soft and sparse in quantity. The rate was slow with a normal rhythm. Responses were delayed. There were no abnormal patterns in speech. Mood: "Fine, tired but here" Affect: Patient's affect is flat with restricted range and even fluctuations; her affect is congruent with her stated  mood. -------------------------------------------------------------------------------- Thought Content The patient experiences no hallucinations. The patient describes paranoid delusions, specifically that someone will hurt her but she is unable to specify who; she denies thought insertion, denies thought withdrawal, denies thought interruption, and denies thought broadcasting. Patient at the time of interview denies active suicidal intent and denies passive suicidal ideation; she denies homicidal intent. Thought Process The patient's thought process is linear and is goal-directed. Insight The patient at the time of interview demonstrates poor insight, as evidenced by lacking understanding of mental health condition/s, inability to identify trigger/s causing mental health decompensation, and inability to identify adaptive and maladaptive coping strategies. Judgement The patient over the past 24 hours demonstrates poor judgement, as evidenced by minimally engaging with staff / other patients.  Assets: Physical Health; Resilience  Treatment Plan Summary: Daily contact with patient to assess and evaluate symptoms and progress in treatment and Medication management Diagnoses / Active Problems: Schizoaffective disorder ([place]) Principal Problem:   Schizoaffective disorder ([place]) Active Problems:   GAD (generalized anxiety disorder)   Vitamin D deficiency   Sialorrhea  Summary   Cheryl Adams is a 31 year old African-American female with prior psychiatric diagnoses of schizoaffective d/o depressive type and GAD who is currently under DSS guardianship. Currently awaiting placement at group home. Now with increased paranoia on lower dose of clozaril as well as continued sedation vs. Depression. Suspect sedation is likely side effect of clozaril but also court decision could be contributing.   Plan  Safety and Monitoring: -- Voluntary admission to inpatient psychiatric unit for safety, stabilization  and treatment -- Daily contact with patient to assess and evaluate symptoms and progress in treatment -- Patient's case to be discussed in multi-disciplinary team meeting -- Observation Level : q15 minute checks --  Vital signs:  q12 hours -- Precautions: suicide, elopement, and assault  2. Medications:  -- #Schizoaffective disorder, bipolar type Education officer, museum noted possibly responding to internal stimuli during hearing yesterday. She also continues to endorse paranoia this AM. She reports improved wakefulness with initiation of modafinil and switching zoloft to nighttime. Clozapine initially decreased in setting of daytime sedation. Most recent trough level subtherapeutic (129) and currently below target level. Plan to increase to 175 today given ongoing psychosis. Tachycardia improved this AM.  -- INCREASE clozapine 175mg  qhs - for psychosis, ANC wnl (10/31). Next check 11/7.  -- Continue atropine drops 1% 1 drop tid for sialorrhea.  -- Continue modafinil 100mg  qam - for daytime sedation 2/2 clozapine and negative symptoms of schizophrenia. -- Continue Zoloft to 100 mg QHS for depression -- low threshold to repeat EKG    #HTN -- Continue Norvasc 10 mg daily for hypertension   #Tachycardia HR slightly elevated this AM. She denies palpitations.  - Continue atenolol 12.5 mg daily - for tachycardia 2/2 clozapine  - Continue to monitor, low threshold to obtain EKG    #Constipation Most recent BM yesterday.  -- Continue Colace 100 mg 2 twice daily for constipation -- Continue Miralax daily for constipation   #Nutritional needs  -- Continue Ensure TID for nutritional support   #Iron deficiency  -- Continue Ferrous Sulfate 325 mg daily for iron replacement   #Weight gain Ppx -- Continue Metformin 500 mg XL for weight gain prophylaxis   #Anxiety -- Continue Atarax 25 mg TID PRN for anxiety  No tobacco use -- Patient does not need nicotine replacement  The  risks/benefits/side-effects/alternatives to the above medication were discussed in detail with the patient and time was given for questions. The patient consents to medication trial. FDA black box warnings, if present, were discussed.  The patient is agreeable with the medication plan, as above. We will monitor the patient's response to pharmacologic treatment, and adjust medications as necessary.  3. Routine and other pertinent labs: EKG monitoring: QTc: 450 (8/28)  Lab Results:     Latest Ref Rng & Units 09/29/2022    6:47 AM 09/22/2022    6:11 PM 09/15/2022    7:09 AM  CBC  WBC 4.0 - 10.5 K/uL 4.6  6.0  4.1   Hemoglobin 12.0 - 15.0 g/dL 12.2  13.1  12.6   Hematocrit 36.0 - 46.0 % 39.6  41.8  39.6   Platelets 150 - 400 K/uL 236  271  240       Latest Ref Rng & Units 06/18/2022    6:27 PM 05/31/2022    6:42 AM 05/29/2022    6:22 PM  BMP  Glucose 70 - 99 mg/dL 117  101  124   BUN 6 - 20 mg/dL 11  6  9    Creatinine 0.44 - 1.00 mg/dL 0.70  0.82  0.79   Sodium 135 - 145 mmol/L 141  137  140   Potassium 3.5 - 5.1 mmol/L 3.5  3.9  3.9   Chloride 98 - 111 mmol/L 109  103  106   CO2 22 - 32 mmol/L 26  28  26    Calcium 8.9 - 10.3 mg/dL 9.3  8.9  8.9    Blood Alcohol level:  Lab Results  Component Value Date   ETH <10 10/16/2021   ETH <10 04/24/2021   Prolactin: Lab Results  Component Value Date   PROLACTIN 18.5 06/18/2022   PROLACTIN 58.3 (H) 06/06/2022   Lipid  Panel: Lab Results  Component Value Date   CHOL 217 (H) 05/18/2022   TRIG 31 05/18/2022   HDL 71 05/18/2022   CHOLHDL 3.1 05/18/2022   VLDL 6 05/18/2022   LDLCALC 140 (H) 05/18/2022   LDLCALC 94 05/02/2021   HbgA1c: Lab Results  Component Value Date   HGBA1C 5.2 05/18/2022   TSH: Lab Results  Component Value Date   TSH 0.964 06/18/2022    4. Group Therapy: -- Encouraged patient to participate in unit milieu and in scheduled group therapies  -- Short Term Goals: Ability to identify changes in lifestyle  to reduce recurrence of condition will improve, Ability to verbalize feelings will improve, Ability to disclose and discuss suicidal ideas, and Ability to identify and develop effective coping behaviors will improve -- Long Term Goals: Improvement in symptoms so as ready for discharge -- Patient is encouraged to participate in group therapy while admitted to the psychiatric unit. -- We will address other chronic and acute stressors, which contributed to the patient's Schizoaffective disorder ([place]) in order to reduce the risk of self-harm at discharge.  5. Discharge Planning:  -- Social work and case management to assist with discharge planning and identification of hospital follow-up needs prior to discharge -- Estimated LOS: 5-7 days -- Discharge Concerns: Need to establish a safety plan; Medication compliance and effectiveness -- Discharge Goals: Return home with outpatient referrals for mental health follow-up including medication management/psychotherapy  I certify that inpatient services furnished can reasonably be expected to improve the patient's condition.   I discussed my assessment, planned testing and intervention for the patient with Dr. Caswell Corwin who agrees with my formulated course of action.  Rolanda Lundborg, MD, PGY-1 10/01/2022, 2:27 PM

## 2022-10-02 ENCOUNTER — Encounter (HOSPITAL_COMMUNITY): Payer: Self-pay

## 2022-10-02 DIAGNOSIS — F251 Schizoaffective disorder, depressive type: Secondary | ICD-10-CM | POA: Diagnosis not present

## 2022-10-02 MED ORDER — CLOZAPINE 100 MG PO TABS
200.0000 mg | ORAL_TABLET | Freq: Every day | ORAL | Status: DC
  Administered 2022-10-02 – 2022-10-04 (×3): 200 mg via ORAL
  Filled 2022-10-02 (×4): qty 2

## 2022-10-02 NOTE — Group Note (Signed)
Recreation Therapy Group Note   Group Topic:Team Building  Group Date: 10/02/2022 Start Time: 0930 End Time: 0950 Facilitators: Pepper Wyndham-McCall, LRT,CTRS Location: [address] Dayroom   Goal Area(s) Addresses:  Patient will effectively work with peer towards shared goal.  Patient will identify skills used to make activity successful.  Patient will identify how skills used during activity can be applied to reach post d/c goals.   Group Description: [employer]. In teams of 5-6, patients were given 15 craft pipe cleaners. Using the materials provided, patients were instructed to compete again the opposing team(s) to build the tallest free-standing structure from floor level. The activity was timed; difficulty increased by Probation officer as Pharmacist, hospital continued.  Systematically resources were removed with additional directions for example, placing one arm behind their back, working in silence, and shape stipulations. LRT facilitated post-activity discussion reviewing team processes and necessary communication skills involved in completion. Patients were encouraged to reflect how the skills utilized, or not utilized, in this activity can be incorporated to positively impact support systems post discharge.   Affect/Mood: Flat   Participation Level: None   Participation Quality: Independent   Behavior: Observant   Speech/Thought Process: None   Insight: None   Judgement: None   Modes of Intervention: STEM Activity   Patient Response to Interventions:  Attentive   Education Outcome:  Acknowledges education and In group clarification offered    Clinical Observations/Individualized Feedback: Pt sat and observed peers.  Pt did not stay for whole group session.    Plan: Continue to engage patient in RT group sessions 2-3x/week.   Thaddius Manes-McCall, LRT,CTRS  10/02/2022 12:01 PM

## 2022-10-02 NOTE — Progress Notes (Signed)
Chaplain engaged Cheryl Adams in a conversation.  She was tearful and sitting by herself.  Chaplain provided supportive presence and inquired about her tears.  Chaplain asked specifically about the recent court case and Kamiyah became more tearful and shared how difficult that had been to watch.  She said that she usually helps herself feel better by affirmations.  Chaplain asked Jewels which affirmations usually help and we repeated the affirmations together.  [address] [place] Pager, [phone]

## 2022-10-02 NOTE — BH IP Treatment Plan (Signed)
Interdisciplinary Treatment and Diagnostic Plan Update  10/02/2022 Time of Session: 0830 Cheryl Adams MRN: [id number]  Principal Diagnosis: Schizoaffective disorder [facility])  Secondary Diagnoses: Principal Problem:   Schizoaffective disorder ([place]) Active Problems:   GAD (generalized anxiety disorder)   Vitamin D deficiency   Sialorrhea   Current Medications:  Current Facility-Administered Medications  Medication Dose Route Frequency Provider Last Rate Last Admin   acetaminophen (TYLENOL) tablet 650 mg  650 mg Oral Q6H PRN Massengill, Ovid Curd, MD       alum & mag hydroxide-simeth (MAALOX/MYLANTA) 200-200-20 MG/5ML suspension 30 mL  30 mL Oral Q4H PRN Massengill, Nathan, MD       amLODipine (NORVASC) tablet 10 mg  10 mg Oral Daily Massengill, Nathan, MD   10 mg at 10/02/22 5400   antiseptic oral rinse (BIOTENE) solution 15 mL  15 mL Mouth Rinse 5 X Daily PRN Massengill, Ovid Curd, MD       atenolol (TENORMIN) tablet 12.5 mg  12.5 mg Oral Daily Massengill, Nathan, MD   12.5 mg at 10/02/22 8676   atropine 1 % ophthalmic solution 1 drop  1 drop Sublingual TID Janine Limbo, MD   1 drop at 10/02/22 0834   cloZAPine (CLOZARIL) tablet 175 mg  175 mg Oral QHS Massengill, Ovid Curd, MD   175 mg at 10/01/22 2122   docusate sodium (COLACE) capsule 100 mg  100 mg Oral BID Massengill, Ovid Curd, MD   100 mg at 10/02/22 0831   ferrous sulfate tablet 325 mg  325 mg Oral Daily Massengill, Ovid Curd, MD   325 mg at 10/02/22 1950   magnesium hydroxide (MILK OF MAGNESIA) suspension 30 mL  30 mL Oral Daily PRN Massengill, Ovid Curd, MD       metFORMIN (GLUCOPHAGE-XR) 24 hr tablet 500 mg  500 mg Oral Q breakfast Massengill, Nathan, MD   500 mg at 10/02/22 0832   modafinil (PROVIGIL) tablet 100 mg  100 mg Oral Daily Massengill, Ovid Curd, MD   100 mg at 10/02/22 0833   polyethylene glycol (MIRALAX / GLYCOLAX) packet 17 g  17 g Oral Daily Massengill, Ovid Curd, MD   17 g at 10/02/22 0831   sertraline (ZOLOFT) tablet 100 mg  100  mg Oral QHS Rolanda Lundborg, MD   100 mg at 10/01/22 2122   vitamin D3 (CHOLECALCIFEROL) tablet 1,000 Units  1,000 Units Oral Daily Massengill, Ovid Curd, MD   1,000 Units at 10/02/22 0831   PTA Medications: Medications Prior to Admission  Medication Sig Dispense Refill Last Dose   amLODipine (NORVASC) 10 MG tablet Take 1 tablet (10 mg total) by mouth daily.      antiseptic oral rinse (BIOTENE) LIQD 15 mLs by Mouth Rinse route 5 (five) times daily as needed for dry mouth.      atenolol (TENORMIN) 25 MG tablet Take 0.5 tablets (12.5 mg total) by mouth daily.      atropine 1 % ophthalmic solution Place 1 drop under the tongue 3 (three) times daily. 2 mL 12    cloZAPine (CLOZARIL) 200 MG tablet Take 1 tablet (200 mg total) by mouth at bedtime.      docusate sodium (COLACE) 100 MG capsule Take 1 capsule (100 mg total) by mouth 2 (two) times daily. 10 capsule 0    ferrous sulfate 325 (65 FE) MG tablet Take 325 mg by mouth daily.      metFORMIN (GLUCOPHAGE-XR) 500 MG 24 hr tablet Take 1 tablet (500 mg total) by mouth daily with breakfast.      methylphenidate (  RITALIN) 10 MG tablet Take 1 tablet (10 mg total) by mouth daily.  0    polyethylene glycol (MIRALAX / GLYCOLAX) 17 g packet Take 17 g by mouth daily. 14 each 0    sertraline (ZOLOFT) 100 MG tablet Take 1 tablet (100 mg total) by mouth daily.      vitamin D3 (CHOLECALCIFEROL) 25 MCG tablet Take 1 tablet (1,000 Units total) by mouth daily. 60 tablet      Patient Stressors:    Patient Strengths:    Treatment Modalities: Medication Management, Group therapy, Case management,  1 to 1 session with clinician, Psychoeducation, Recreational therapy.   Physician Treatment Plan for Primary Diagnosis: Schizoaffective disorder (HCC) Long Term Goal(s): Improvement in symptoms so as ready for discharge   Short Term Goals: Ability to identify changes in lifestyle to reduce recurrence of condition will improve Ability to verbalize feelings will  improve Ability to disclose and discuss suicidal ideas Ability to identify and develop effective coping behaviors will improve  Medication Management: Evaluate patient's response, side effects, and tolerance of medication regimen.  Therapeutic Interventions: 1 to 1 sessions, Unit Group sessions and Medication administration.  Evaluation of Outcomes: Progressing  Physician Treatment Plan for Secondary Diagnosis: Principal Problem:   Schizoaffective disorder (HCC) Active Problems:   GAD (generalized anxiety disorder)   Vitamin D deficiency   Sialorrhea  Long Term Goal(s): Improvement in symptoms so as ready for discharge   Short Term Goals: Ability to identify changes in lifestyle to reduce recurrence of condition will improve Ability to verbalize feelings will improve Ability to disclose and discuss suicidal ideas Ability to identify and develop effective coping behaviors will improve     Medication Management: Evaluate patient's response, side effects, and tolerance of medication regimen.  Therapeutic Interventions: 1 to 1 sessions, Unit Group sessions and Medication administration.  Evaluation of Outcomes: Progressing   RN Treatment Plan for Primary Diagnosis: Schizoaffective disorder (HCC) Long Term Goal(s): Knowledge of disease and therapeutic regimen to maintain health will improve  Short Term Goals: Ability to remain free from injury will improve, Ability to verbalize frustration and anger appropriately will improve, Ability to demonstrate self-control, Ability to participate in decision making will improve, Ability to verbalize feelings will improve, Ability to disclose and discuss suicidal ideas, Ability to identify and develop effective coping behaviors will improve, and Compliance with prescribed medications will improve  Medication Management: RN will administer medications as ordered by provider, will assess and evaluate patient's response and provide education to  patient for prescribed medication. RN will report any adverse and/or side effects to prescribing provider.  Therapeutic Interventions: 1 on 1 counseling sessions, Psychoeducation, Medication administration, Evaluate responses to treatment, Monitor vital signs and CBGs as ordered, Perform/monitor CIWA, COWS, AIMS and Fall Risk screenings as ordered, Perform wound care treatments as ordered.  Evaluation of Outcomes: Progressing   LCSW Treatment Plan for Primary Diagnosis: Schizoaffective disorder (HCC) Long Term Goal(s): Safe transition to appropriate next level of care at discharge, Engage patient in therapeutic group addressing interpersonal concerns.  Short Term Goals: Engage patient in aftercare planning with referrals and resources, Increase social support, Increase ability to appropriately verbalize feelings, Increase emotional regulation, Facilitate acceptance of mental health diagnosis and concerns, Facilitate patient progression through stages of change regarding substance use diagnoses and concerns, Identify triggers associated with mental health/substance abuse issues, and Increase skills for wellness and recovery  Therapeutic Interventions: Assess for all discharge needs, 1 to 1 time with Social worker, Explore available resources and support  systems, Assess for adequacy in community support network, Educate family and significant other(s) on suicide prevention, Complete Psychosocial Assessment, Interpersonal group therapy.  Evaluation of Outcomes: Progressing   Progress in Treatment: Attending groups: Yes. Participating in groups: Yes. Taking medication as prescribed: Yes. Toleration medication: Yes. Family/Significant other contact made: Yes, individual(s) contacted:  Sherlean Foot, mother  Patient understands diagnosis: No. Discussing patient identified problems/goals with staff: Yes. Medical problems stabilized or resolved: Yes. Denies suicidal/homicidal ideation:  Yes. Issues/concerns per patient self-inventory: Yes. Other: none  New problem(s) identified: No, Describe:  none  New Short Term/Long Term Goal(s): Patient to work towards elimination of symptoms of psychosis, medication management for mood stabilization; development of comprehensive mental wellness plan.  Patient Goals: No additional goals identified at this time. Patient to continue to work towards original goals identified in initial treatment team meeting. CSW will remain available to patient should they voice additional treatment goals.   Discharge Plan or Barriers: Patient continues to lack adequate placement/supervision at discharge.   Reason for Continuation of Hospitalization: Other; describe Patient is boarding   Estimated Length of Stay: TBD   Last 3 [place] Suicide Severity Risk Score: Flowsheet Row [facility] ([facility] from 05/18/2022 in [facility] Office Visit from 04/14/2022 in [facility]  C-SSRS RISK CATEGORY No Risk High Risk No Risk      Almedia Balls 10/02/2022 9:19 AM

## 2022-10-02 NOTE — Progress Notes (Signed)
[facility] MD Resident Progress Note  10/02/2022 3:56 PM Cheryl Adams  MRN:  [id number] Principal Problem: Schizoaffective disorder ([place]) Diagnosis: Principal Problem:   Schizoaffective disorder ([place]) Active Problems:   GAD (generalized anxiety disorder)   Vitamin D deficiency   Sialorrhea  Reason for admission   Cheryl Adams is a 31 year old African-American female with prior psychiatric diagnoses of schizoaffective d/o depressive type and GAD who initially presented to the [facility] health urgent care [facility]) accompanied by her mother with complaints of paranoia. She was previously admitted to [facility] from 6/20-10/10/2022 and treated for schizoaffective disorder, depressive type with multiple medication trials (geodon, haldol, caplyta, thorazine, zyprexa, risperdal) and ultimately had partial response to clozapine. DSS was granted legal gaurdianship. On 09/10/2022, pt was d/c for competency evaluation per court. On 09/10/22, patient was readmitted to the psych unit after the evaluation. (admitted on 09/10/2022, total  LOS: 22 days )  Chart review from last 24 hours   The patient's chart was reviewed and nursing notes were reviewed. The patient's case was discussed in multidisciplinary team meeting.  - Overnight events per chart review: continues to be anxious. Nurse discussed coping skills with patient.  - Patient took all scheduled meds  - Patient did not take any PRN meds   Information obtained during interview   The patient was seen and evaluated on the unit. On assessment today the patient reports good sleep and appetite. She reports initially difficulty falling asleep but she was able to stay asleep. Discussed that she can ask for trazodone if she needs it.  She denies any side effects to the medications.  She reports that her last bowel movement was yesterday.  When discussing her mood, she rates her anxiety as a 4 out of 10, states it is around the same.  She reports improvement in  her energy.  She continues to endorse paranoia with people out there, she is unable to specify who.  Today, she denies paranoia with people in the hospital.  She denies suicidal ideation or homicidal ideation.  Review of Systems  Respiratory:  Negative for shortness of breath.   Cardiovascular:  Negative for chest pain.  Gastrointestinal:  Negative for abdominal pain, constipation, diarrhea, nausea and vomiting.  Neurological:  Negative for headaches.   Objective   Blood pressure 119/81, pulse (!) 115, temperature 98.8 F (37.1 C), temperature source Oral, resp. rate 20, SpO2 99 %. There is no height or weight on file to calculate BMI. Sleep: Not charted Current Facility-Administered Medications  Medication Dose Route Frequency Provider Last Rate Last Admin   acetaminophen (TYLENOL) tablet 650 mg  650 mg Oral Q6H PRN Massengill, Nathan, MD       alum & mag hydroxide-simeth (MAALOX/MYLANTA) 200-200-20 MG/5ML suspension 30 mL  30 mL Oral Q4H PRN Massengill, Nathan, MD       amLODipine (NORVASC) tablet 10 mg  10 mg Oral Daily Massengill, Nathan, MD   10 mg at 10/02/22 2778   antiseptic oral rinse (BIOTENE) solution 15 mL  15 mL Mouth Rinse 5 X Daily PRN Massengill, Ovid Curd, MD       atenolol (TENORMIN) tablet 12.5 mg  12.5 mg Oral Daily Massengill, Nathan, MD   12.5 mg at 10/02/22 0832   atropine 1 % ophthalmic solution 1 drop  1 drop Sublingual TID Janine Limbo, MD   1 drop at 10/02/22 0834   cloZAPine (CLOZARIL) tablet 200 mg  200 mg Oral QHS Rolanda Lundborg, MD  docusate sodium (COLACE) capsule 100 mg  100 mg Oral BID Massengill, Harrold Donath, MD   100 mg at 10/02/22 0831   ferrous sulfate tablet 325 mg  325 mg Oral Daily Massengill, Nathan, MD   325 mg at 10/02/22 3976   magnesium hydroxide (MILK OF MAGNESIA) suspension 30 mL  30 mL Oral Daily PRN Massengill, Harrold Donath, MD       metFORMIN (GLUCOPHAGE-XR) 24 hr tablet 500 mg  500 mg Oral Q breakfast Massengill, Nathan, MD   500 mg at  10/02/22 7341   modafinil (PROVIGIL) tablet 100 mg  100 mg Oral Daily Massengill, Harrold Donath, MD   100 mg at 10/02/22 9379   polyethylene glycol (MIRALAX / GLYCOLAX) packet 17 g  17 g Oral Daily Massengill, Harrold Donath, MD   17 g at 10/02/22 0831   sertraline (ZOLOFT) tablet 100 mg  100 mg Oral QHS Karie Fetch, MD   100 mg at 10/01/22 2122   vitamin D3 (CHOLECALCIFEROL) tablet 1,000 Units  1,000 Units Oral Daily Phineas Inches, MD   1,000 Units at 10/02/22 0831   Physical Exam Constitutional:      Appearance: the patient is not toxic-appearing.  Pulmonary:     Effort: Pulmonary effort is normal.  Neurological:     General: No focal deficit present.     Mental Status: the patient is alert and oriented to person, place, and time.  AIMS:  Facial and Oral Movements Muscles of Facial Expression: None, normal Lips and Perioral Area: None, normal Jaw: None, normal Tongue: None, normal,Extremity Movements Upper (arms, wrists, hands, fingers): None, normal Lower (legs, knees, ankles, toes): None, normal Trunk Movements: None, normal  Neck, shoulders, hips: None, normal Overall Severity Severity of abnormal movements (highest score from questions above): None, normal Incapacitation due to abnormal movements: None, normal Patient's awareness of abnormal movements (rate only patient's report): No Awareness, Dental Status Current problems with teeth and/or dentures?: No Does patient usually wear dentures?: No   No stiffness, cogwheeling, or tremors noted on exam.   Mental Status Exam   Appearance and Grooming: Patient is casually dressed in a black V-neck quarter length shirt and jeans . The patient has no noticeable scent or odor. Motor activity: The patient's movement speed was normal; her gait was normal. There was no notable abnormal facial movements and no notable abnormal extremity movements. Behavior: The patient appears in no acute distress, and during the interview, was calm,  focused, required minimal redirection, and behaving appropriately to scenario; she was able to follow commands and compliant to requests and made minimal eye contact. The patient appeared internally preoccupied, as evidenced by minimal eye contact . Attitude: Patient's attitude towards the interviewer was cooperative and open. Speech: The patient's speech was clear, fluent, and with good articulation. The volume of her speech was soft and sparse in quantity. The rate was normal with a normal rhythm. Responses were delayed. There were no abnormal patterns in speech. Mood: "Alright" Affect: Patient's affect is flat with restricted range and even fluctuations; her affect is congruent with her stated mood.  She appears depressed. ------------------------------------------------------------------------------------------------------------------------- Thought Content The patient experiences no hallucinations. The patient describes  unspecified paranoid delusions ; she denies thought insertion, denies thought withdrawal, denies thought interruption, and denies thought broadcasting. Patient at the time of interview denies active suicidal intent and denies passive suicidal ideation; she denies homicidal intent. Thought Process The patient's thought process is linear and is concrete, with poverty of thought and content . Insight The patient at the  time of interview demonstrates poor insight, as evidenced by lacking understanding of mental health condition/s, inability to identify trigger/s causing mental health decompensation, and inability to identify adaptive and maladaptive coping strategies. Judgement The patient over the past 24 hours demonstrates poor judgement, as evidenced by minimally engaging with staff / other patients.  Assets: Physical Health; Resilience  Treatment Plan Summary: Daily contact with patient to assess and evaluate symptoms and progress in treatment and Medication  management  Summary   Diagnoses / Active Problems: Schizoaffective disorder (HCC) Principal Problem:   Schizoaffective disorder (HCC) Active Problems:   GAD (generalized anxiety disorder)   Vitamin D deficiency   Sialorrhea  Cheryl Adams is a 31 year old African-American female with prior psychiatric diagnoses of schizoaffective d/o depressive type and GAD who is currently under DSS guardianship.  She continues to have ongoing paranoia and appears to be depressed.  We will first target her ongoing paranoia by up titrating her clozapine.  Plan  Safety and Monitoring: -- Voluntary admission to inpatient psychiatric unit for safety, stabilization and treatment -- Daily contact with patient to assess and evaluate symptoms and progress in treatment -- Patient's case to be discussed in multi-disciplinary team meeting -- Observation Level : q15 minute checks -- Vital signs:  q12 hours -- Precautions: suicide, elopement, and assault  2. Medications:  -- #Schizoaffective disorder, bipolar type Given continued paranoia and subtherapeutic dose of Clazuril, we will increase her clozapine today. Will monitor for side effects. -- INCREASE clozapine 175mg  qhs - for psychosis, ANC wnl (10/31). Next check 11/7.  -- Continue atropine drops 1% 1 drop tid for sialorrhea.  -- Continue modafinil 100mg  qam - for daytime sedation 2/2 clozapine and negative symptoms of schizophrenia. -- Continue Zoloft to 100 mg QHS for depression -- low threshold to repeat EKG    #HTN -- Continue Norvasc 10 mg daily for hypertension   #Tachycardia HR slightly elevated this AM. She denies palpitations.  - Continue atenolol 12.5 mg daily - for tachycardia 2/2 clozapine  - Continue to monitor, low threshold to obtain EKG    #Constipation Most recent BM yesterday.  -- Continue Colace 100 mg 2 twice daily for constipation -- Continue Miralax daily for constipation   #Nutritional needs  -- Continue Ensure TID for  nutritional support   #Iron deficiency  -- Continue Ferrous Sulfate 325 mg daily for iron replacement   #Weight gain Ppx -- Continue Metformin 500 mg XL for weight gain prophylaxis   #Anxiety -- Continue Atarax 25 mg TID PRN for anxiety   No tobacco use -- Patient does not need nicotine replacement   The risks/benefits/side-effects/alternatives to the above medication were discussed in detail with the patient and time was given for questions. The patient consents to medication trial. FDA black box warnings, if present, were discussed.   The patient is agreeable with the medication plan, as above. We will monitor the patient's response to pharmacologic treatment, and adjust medications as necessary.  3. Routine and other pertinent labs: EKG monitoring: QTc: 450 (8/28)   Lab Results:     Latest Ref Rng & Units 09/29/2022    6:47 AM 09/22/2022    6:11 PM 09/15/2022    7:09 AM  CBC  WBC 4.0 - 10.5 K/uL 4.6  6.0  4.1   Hemoglobin 12.0 - 15.0 g/dL 09/24/2022  09/17/2022  09.3   Hematocrit 36.0 - 46.0 % 39.6  41.8  39.6   Platelets 150 - 400 K/uL 236  271  240  Latest Ref Rng & Units 06/18/2022    6:27 PM 05/31/2022    6:42 AM 05/29/2022    6:22 PM  BMP  Glucose 70 - 99 mg/dL 099  833  825   BUN 6 - 20 mg/dL 11  6  9    Creatinine 0.44 - 1.00 mg/dL  0.53  9.76   Sodium 135 - 145 mmol/L 141  137  140   Potassium 3.5 - 5.1 mmol/L 3.5  3.9  3.9   Chloride 98 - 111 mmol/L 109  103  106   CO2 22 - 32 mmol/L 26  28  26    Calcium 8.9 - 10.3 mg/dL 9.3  8.9  8.9    Blood Alcohol level:  Lab Results  Component Value Date   ETH <10 10/16/2021   ETH <10 04/24/2021   Prolactin: Lab Results  Component Value Date   PROLACTIN 18.5 06/18/2022   PROLACTIN 58.3 (H) 06/06/2022   Lipid Panel: Lab Results  Component Value Date   CHOL 217 (H) 05/18/2022   TRIG 31 05/18/2022   HDL 71 05/18/2022   CHOLHDL 3.1 05/18/2022   VLDL 6 05/18/2022   LDLCALC 140 (H) 05/18/2022   LDLCALC 94  05/02/2021   HbgA1c: Lab Results  Component Value Date   HGBA1C 5.2 05/18/2022   TSH: Lab Results  Component Value Date   TSH 0.964 06/18/2022    4. Group Therapy: -- Encouraged patient to participate in unit milieu and in scheduled group therapies  -- Short Term Goals: Ability to identify changes in lifestyle to reduce recurrence of condition will improve, Ability to verbalize feelings will improve, Ability to identify and develop effective coping behaviors will improve, and Ability to identify triggers associated with substance abuse/mental health issues will improve -- Long Term Goals: Improvement in symptoms so as ready for discharge -- Patient is encouraged to participate in group therapy while admitted to the psychiatric unit. -- We will address other chronic and acute stressors, which contributed to the patient's Schizoaffective disorder (HCC) in order to reduce the risk of self-harm at discharge.  5. Discharge Planning:  -- Social work and case management to assist with discharge planning and identification of hospital follow-up needs prior to discharge -- Estimated LOS: 5-7 days -- Discharge Concerns: Need to establish a safety plan; Medication compliance and effectiveness -- Discharge Goals: Return home with outpatient referrals for mental health follow-up including medication management/psychotherapy  I certify that inpatient services furnished can reasonably be expected to improve the patient's condition.   I discussed my assessment, planned testing and intervention for the patient with Dr. 05/20/2022 who agrees with my formulated course of action.  06/20/2022, MD, PGY-1 10/02/2022, 3:56 PM

## 2022-10-02 NOTE — Group Note (Signed)
LCSW Group Therapy Note   Group Date: 10/02/2022 Start Time: 1300 End Time: 1400  Type of Therapy: Group Therapy: Setting Boundaries  Participation Level: Active  Description of Group: This group will address the use of boundaries in their personal lives. Patients will explore why boundaries are important, the difference between healthy and unhealthy boundaries, and negative and positive outcomes of different boundaries and will look at how boundaries can be crossed.  Patients will be encouraged to identify current boundaries in their own lives and identify what kind of boundary is being set. Facilitators will guide patients in utilizing problem-solving interventions to address and correct types boundaries being used and to address when no boundary is being used. Understanding and applying boundaries will be explored and addressed for obtaining and maintaining a balanced life. Patients will be encouraged to explore ways to assertively make their boundaries and needs known to significant others in their lives, using other group members and facilitator for role play, support, and feedback.   Therapeutic Goals: 1. Patient will identify areas in their life where setting clear boundaries could be used to improve their life.  2. Patient will identify signs/triggers that a boundary is not being respected. 3. Patient will identify two ways to set boundaries in order to achieve balance in their lives: 4. Patient will demonstrate ability to communicate their needs and set boundaries through discussion and/or role plays   Summary of Progress/Problems: The Pt attended group and remained there the entire time.  The Pt accepted all worksheets and participated in the discussion.  The Pt was able to identify ways to set boundaries and was able to tie information from Wednesday group into the discussion today.  The Pt was appropriate with their peers and demonstrated a working knowledge of what boundaries are and  how to set them with others after discharge.   Darleen Crocker, LCSWA 10/02/2022  2:05 PM

## 2022-10-03 MED ORDER — TRAZODONE HCL 50 MG PO TABS
50.0000 mg | ORAL_TABLET | Freq: Every evening | ORAL | Status: DC | PRN
  Administered 2022-10-08 – 2022-10-11 (×4): 50 mg via ORAL
  Filled 2022-10-03 (×4): qty 1

## 2022-10-03 NOTE — Progress Notes (Signed)
   10/02/22 2200  Psych Admission Type (Psych Patients Only)  Admission Status Voluntary  Psychosocial Assessment  Patient Complaints Anxiety  Eye Contact Avertive  Facial Expression Flat  Affect Appropriate to circumstance  Speech Logical/coherent  Interaction Assertive;Cautious  Motor Activity Slow  Appearance/Hygiene Unremarkable  Behavior Characteristics Cooperative;Appropriate to situation  Mood Pleasant  Thought Process  Coherency WDL  Content WDL  Delusions None reported or observed  Perception WDL  Hallucination None reported or observed  Judgment Impaired  Confusion None  Danger to Self  Current suicidal ideation? Denies  Agreement Not to Harm Self Yes  Description of Agreement verbal  Danger to Others  Danger to Others None reported or observed

## 2022-10-03 NOTE — Progress Notes (Signed)
   10/03/22 1700  Psych Admission Type (Psych Patients Only)  Admission Status Voluntary  Psychosocial Assessment  Patient Complaints Anxiety;Depression  Eye Contact Brief  Facial Expression Flat  Affect Appropriate to circumstance  Speech Logical/coherent  Interaction Childlike;Minimal  Motor Activity Slow  Appearance/Hygiene Unremarkable  Behavior Characteristics Cooperative;Appropriate to situation  Mood Pleasant  Thought Process  Coherency WDL  Content WDL  Delusions None reported or observed  Perception WDL  Hallucination None reported or observed  Judgment Poor  Confusion None  Danger to Self  Current suicidal ideation? Denies  Agreement Not to Harm Self Yes  Description of Agreement verbal contract  Danger to Others  Danger to Others None reported or observed

## 2022-10-03 NOTE — Progress Notes (Signed)
[place] Group Notes:  (Nursing/MHT/Case Management/Adjunct)  Date:  10/03/2022  Time:  2015  Type of Therapy:   wrap up group  Participation Level:  Active  Participation Quality:  Appropriate, Attentive, Sharing, and Supportive  Affect:  {BHH AFFECT:22266}  Cognitive:  {BHH COGNITIVE:22267}  Insight:  {BHH Insight2:20797}  Engagement in Group:  {BHH ENGAGEMENT IN CHYIF:02774}  Modes of Intervention:  {BHH MODES OF INTERVENTION:22269}  Summary of Progress/Problems:  Shellia Cleverly 10/03/2022, 9:13 PM

## 2022-10-03 NOTE — Group Note (Signed)
LCSW Group Therapy Note 10/03/2022  10:00-11:00am  Type of Therapy and Topic:  Group Therapy: Anger   Participation Level:  Minimal   Description of Group: In this group, patients identified a recent time they became angry and how they reacted.  They analyzed how their reaction was possibly beneficial and how it was possibly unhelpful.  They learned how to recognize the physical responses they have to anger-provoking situations that can alert them to the possibility that they are becoming angry.  They learned the difference between healthy coping skills that work and keep working versus unhealthy coping skills that work initially then start hurting. They were taught that anger is a secondary emotion fueled by an underlying feeling.  They explored what their own underlying emotions were during the incident they discussed in group.  Therapeutic Goals: Patients will remember their last incident of anger and how they felt emotionally and physically. Patients will identify how their behavior at that time worked for them, as well as how it worked against them. Patients will be able to identify their reaction as healthy or unhealthy, and identify possible reactions that would have been the opposite Patients will learn that anger itself is a secondary emotion and will think about their primary emotion at the time of their last incident of anger  Summary of Patient Progress:  The patient shared that her most recent time of anger was yesterday and said she was trying to deal with negative thoughts.  Her coping skill used was walking.  Therapeutic Modalities:   Cognitive Behavioral Therapy  Maretta Los, LCSW 10/03/2022  6:19 PM

## 2022-10-03 NOTE — BHH Group Notes (Signed)
.  Psychoeducational Group Note    Date:  114/2023 Time: 1300-1400    Purpose of Group: . The group focus' on teaching patients on how to identify their needs and their Life Skills:  A group where two lists are made. What people need and what are things that we do that are unhealthy. The lists are developed by the patients and it is explained that we often do the actions that are not healthy to get our list of needs met.  Goal:: to develop the coping skills needed to get their needs met  Participation Level:  Active  Participation Quality:  Appropriate  Affect:  Appropriate  Cognitive:  Oriented  Insight: Improving  Engagement in Group:  Engaged  Modes of Intervention:  Activity, Discussion, Education, and Support  Additional Comments:  Rates her energy at a 4/10. Listened. Did not participated verbally in the group, but paid attention to all that was said.  Cheryl Adams

## 2022-10-03 NOTE — BHH Group Notes (Signed)
Goals Group 10/03/22   Group Focus: affirmation, clarity of thought, and goals/reality orientation Treatment Modality:  Psychoeducation Interventions utilized were assignment, group exercise, and support Purpose: To be able to understand and verbalize the reason for their admission to the hospital. To understand that the medication helps with their chemical imbalance but they also need to work on their choices in life. To be challenged to develop a list of 30 positives about themselves. Also introduce the concept that "feelings" are not reality.  Participation Level:  Active  Participation Quality:  Appropriate  Affect:  Appropriate  Cognitive:  Appropriate  Insight:  Improving  Engagement in Group:  Engaged  Additional Comments: Rates her energy at a 4/10. States that this is her home now. "I live here".  Paulino Rily

## 2022-10-03 NOTE — Progress Notes (Signed)
[facility] MD Resident Progress Note  10/03/2022 6:05 AM Cheryl Adams  MRN:  [id number] Principal Problem: Schizoaffective disorder ([place]) Diagnosis: Principal Problem:   Schizoaffective disorder ([place]) Active Problems:   GAD (generalized anxiety disorder)   Vitamin D deficiency   Sialorrhea  Reason for admission   Cheryl Adams is a 31 year old African-American female with prior psychiatric diagnoses of schizoaffective d/o depressive type and GAD who initially presented to the [facility] health urgent care [facility]) accompanied by her mother with complaints of paranoia. She was previously admitted to [facility] from 6/20-10/10/2022 and treated for schizoaffective disorder, depressive type with multiple medication trials (geodon, haldol, caplyta, thorazine, zyprexa, risperdal) and ultimately had partial response to clozapine. DSS was granted legal gaurdianship. On 09/10/2022, pt was d/c for competency evaluation per court. On 09/10/22, patient was readmitted to the psych unit after the evaluation (admitted on 09/10/2022, total  LOS: 23 days )  Chart review from last 24 hours   The patient's chart was reviewed and nursing notes were reviewed. The patient's case was discussed in multidisciplinary team meeting.  - Overnight events per chart review: sat and observed peers during RT group. Attended group therapy. Sat with Chaplain and discussed court case.  Per nursing report in rounds, she seemed to have more energy. - Patient received all scheduled meds  - Patient did not receive any PRN meds   Information obtained during interview   The patient was seen and evaluated on the unit. On assessment today the patient reports she is doing "alright."  When asked what makes it all right, she replies "less tired." She reports sleeping around 5 hours.  She reports good appetite.  She reports her sialorrhea is "better."  She reports some trouble with falling asleep.  She described her mood as "relaxed", reports it  is better compared to yesterday.  She denies visual and auditory hallucinations.  When asked about people hurting her outside of the hospital she replies "maybe."  When asked to specify who she responds "I do not know."  She reports that this paranoia is same as yesterday.  She denies SI/HI.  She reports her depression is "same."  She reports ongoing negative thoughts about not feeling good enough.  Review of Systems  Respiratory:  Negative for shortness of breath.   Cardiovascular:  Negative for chest pain.  Gastrointestinal:  Negative for abdominal pain, constipation, diarrhea, nausea and vomiting.  Neurological:  Negative for headaches.   Targeted review of symptoms, specific for clozapine: Malaise/Sedation: "Less tired" Chest pain: denies Shortness of breath: denies Exertional capacity: denies Tachycardia: noted on vitals  Cough: denies Sore Throat: denies Fever: none noted on vitals  Orthostatic hypotension (dizziness with standing): denies Hypersalivation: "better" Constipation: denies. .  Nausea: denies Nocturnal enuresis: denies   Symptoms of GERD -Acid reflux: denies -Burning sensation in chest, usually after eating: denies -Trouble swallowing: denies -Sensation of lump in throat: denies -Regurgitation (backwash) of food or sour liquid: denies  Objective   Blood pressure 130/76, pulse (!) 101, temperature 98.8 F (37.1 C), temperature source Oral, resp. rate 20, SpO2 100 %. There is no height or weight on file to calculate BMI. Sleep: 6.75 hours Current Medications: Current Facility-Administered Medications  Medication Dose Route Frequency Provider Last Rate Last Admin   acetaminophen (TYLENOL) tablet 650 mg  650 mg Oral Q6H PRN Massengill, Nathan, MD       alum & mag hydroxide-simeth (MAALOX/MYLANTA) 200-200-20 MG/5ML suspension 30 mL  30 mL Oral Q4H PRN Massengill, Ovid Curd,  MD       amLODipine (NORVASC) tablet 10 mg  10 mg Oral Daily Massengill, Nathan, MD   10 mg at  10/02/22 0160   antiseptic oral rinse (BIOTENE) solution 15 mL  15 mL Mouth Rinse 5 X Daily PRN Massengill, Ovid Curd, MD       atenolol (TENORMIN) tablet 12.5 mg  12.5 mg Oral Daily Massengill, Nathan, MD   12.5 mg at 10/02/22 1093   atropine 1 % ophthalmic solution 1 drop  1 drop Sublingual TID Janine Limbo, MD   1 drop at 10/02/22 1703   cloZAPine (CLOZARIL) tablet 200 mg  200 mg Oral QHS Rolanda Lundborg, MD   200 mg at 10/02/22 2110   docusate sodium (COLACE) capsule 100 mg  100 mg Oral BID Massengill, Ovid Curd, MD   100 mg at 10/02/22 1702   ferrous sulfate tablet 325 mg  325 mg Oral Daily Massengill, Ovid Curd, MD   325 mg at 10/02/22 2355   magnesium hydroxide (MILK OF MAGNESIA) suspension 30 mL  30 mL Oral Daily PRN Massengill, Ovid Curd, MD       metFORMIN (GLUCOPHAGE-XR) 24 hr tablet 500 mg  500 mg Oral Q breakfast Massengill, Ovid Curd, MD   500 mg at 10/02/22 0832   modafinil (PROVIGIL) tablet 100 mg  100 mg Oral Daily Massengill, Ovid Curd, MD   100 mg at 10/02/22 7322   polyethylene glycol (MIRALAX / GLYCOLAX) packet 17 g  17 g Oral Daily Massengill, Ovid Curd, MD   17 g at 10/02/22 0831   sertraline (ZOLOFT) tablet 100 mg  100 mg Oral QHS Rolanda Lundborg, MD   100 mg at 10/02/22 2112   vitamin D3 (CHOLECALCIFEROL) tablet 1,000 Units  1,000 Units Oral Daily Janine Limbo, MD   1,000 Units at 10/02/22 0831   Physical Exam Constitutional:      Appearance: the patient is not toxic-appearing.  Pulmonary:     Effort: Pulmonary effort is normal.  Neurological:     General: No focal deficit present.     Mental Status: the patient is alert and oriented to person, place, time.  She is not oriented to city.  AIMS:  Facial and Oral Movements Muscles of Facial Expression: None, normal Lips and Perioral Area: None, normal Jaw: None, normal Tongue: None, normal, Extremity Movements Upper (arms, wrists, hands, fingers): None, normal Lower (legs, knees, ankles, toes): None, normal Trunk  Movements: None, normal  Neck, shoulders, hips: None, normal Overall Severity Severity of abnormal movements (highest score from questions above): None, normal Incapacitation due to abnormal movements: None, normal Patient's awareness of abnormal movements (rate only patient's report): No Awareness, Dental Status Current problems with teeth and/or dentures?: No Does patient usually wear dentures?: No   No stiffness, cogwheeling, or tremors noted on exam.    Mental Status Exam   Appearance and Grooming: Patient is casually dressed in scrubs . The patient has no noticeable scent or odor. Motor activity: The patient's movement speed was normal; her gait was normal. There was no notable abnormal facial movements and no notable abnormal extremity movements. Behavior: The patient appears in no acute distress, and during the interview, was calm, focused, required minimal redirection, and behaving appropriately to scenario; she was able to follow commands and compliant to requests and made minimal eye contact. The patient did not appear internally or externally preoccupied. Attitude: Patient's attitude towards the interviewer was cooperative and open. Speech: The patient's speech was clear, fluent, and with appropriately placed inflections. The volume of her speech  was soft and sparse in quantity. The rate was normal with a normal rhythm. Responses were delayed. There were no abnormal patterns in speech. Mood: "Alright" Affect: Patient's affect is flat with restricted range and even fluctuations; her affect is congruent with her stated mood. ------------------------------------------------------------------------------------------------------------------------- Thought Content The patient experiences no hallucinations. The patient describes persecutory delusions, specifically she endorses that "maybe "there are people who will harm her when she leaves the hospital but is unable to specify who. ;  she denies thought insertion, denies thought withdrawal, denies thought interruption, and denies thought broadcasting. Patient at the time of interview denies active suicidal intent and denies passive suicidal ideation; she denies homicidal intent. Thought Process The patient's thought process is concrete, with poverty of thought and content . Insight The patient at the time of interview demonstrates poor insight, as evidenced by lacking understanding of mental health condition/s and inability to identify trigger/s causing mental health decompensation. Judgement The patient over the past 24 hours demonstrates fair judgement, as evidenced by adhering to medication regimen and passively participating in group therapy.  Assets: Physical Health; Resilience  Treatment Plan Summary: Daily contact with patient to assess and evaluate symptoms and progress in treatment and Medication management Summary   Diagnoses / Active Problems: Schizoaffective disorder ([place]) Principal Problem:   Schizoaffective disorder ([place]) Active Problems:   GAD (generalized anxiety disorder)   Vitamin D deficiency   Sialorrhea  Cheryl Adams is a 31 year old African-American female with prior psychiatric diagnoses of schizoaffective d/o depressive type and GAD who is currently under DSS guardianship.  She continues to have ongoing paranoia and continues to have depressed mood. We will first target her ongoing paranoia by up titrating her clozapine and monitor her on the increased dose of clozapine prior to increasing her modafinil or Zoloft.  Plan  Safety and Monitoring: -- Voluntary admission to inpatient psychiatric unit for safety, stabilization and treatment -- Daily contact with patient to assess and evaluate symptoms and progress in treatment -- Patient's case to be discussed in multi-disciplinary team meeting -- Observation Level : q15 minute checks -- Vital signs:  q12 hours -- Precautions: suicide, elopement,  and assault  2. Medications:  #Schizoaffective disorder, bipolar type Given that she just increased Clozaril yesterday, we will monitor her mood on the current dose prior to increasing modafinil or Zoloft.  Per pharmacy, concern that additional increase in SSRI could cause emotional blunting.  -- Continue clozapine 200mg  qhs - for psychosis, ANC wnl (10/31). Next check 11/7.  -- Continue atropine drops 1% 1 drop tid for sialorrhea.  -- Continue modafinil 100mg  qam - for daytime sedation 2/2 clozapine and negative symptoms of schizophrenia. -- Continue Zoloft 100 mg QHS for depression -- low threshold to repeat EKG    #HTN -- Continue Norvasc 10 mg daily for hypertension   #Tachycardia HR slightly elevated this AM.  Suspect side effect of clozapine. She denies palpitations.  Will consider increasing atenolol to 25 mg if tachycardia persists - Continue atenolol 12.5 mg daily - for tachycardia 2/2 clozapine  - Continue to monitor, low threshold to obtain EKG    #Constipation Has been having regular bowel movements daily -Continue Colace 100 mg 2 twice daily for constipation -Continue Miralax daily for constipation   #Nutritional needs  -- Continue Ensure TID for nutritional support   #Iron deficiency  -- Continue Ferrous Sulfate 325 mg daily for iron replacement   #Weight gain Ppx -- Continue Metformin 500 mg XL for weight gain prophylaxis   #  Anxiety -- Continue Atarax 25 mg TID PRN for anxiety   No tobacco use -- Patient does not need nicotine replacement  The risks/benefits/side-effects/alternatives to the above medication were discussed in detail with the patient and time was given for questions. The patient consents to medication trial. FDA black box warnings, if present, were discussed.  The patient is agreeable with the medication plan, as above. We will monitor the patient's response to pharmacologic treatment, and adjust medications as necessary.  3. Routine and other  pertinent labs: EKG monitoring: QTc:  450 (8/28)  Lab Results:     Latest Ref Rng & Units 09/29/2022    6:47 AM 09/22/2022    6:11 PM 09/15/2022    7:09 AM  CBC  WBC 4.0 - 10.5 K/uL 4.6  6.0  4.1   Hemoglobin 12.0 - 15.0 g/dL 12.2  13.1  12.6   Hematocrit 36.0 - 46.0 % 39.6  41.8  39.6   Platelets 150 - 400 K/uL 236  271  240       Latest Ref Rng & Units 06/18/2022    6:27 PM 05/31/2022    6:42 AM 05/29/2022    6:22 PM  BMP  Glucose 70 - 99 mg/dL 117  101  124   BUN 6 - 20 mg/dL 11  6  9    Creatinine 0.44 - 1.00 mg/dL 0.70  0.82  0.79   Sodium 135 - 145 mmol/L 141  137  140   Potassium 3.5 - 5.1 mmol/L 3.5  3.9  3.9   Chloride 98 - 111 mmol/L 109  103  106   CO2 22 - 32 mmol/L 26  28  26    Calcium 8.9 - 10.3 mg/dL 9.3  8.9  8.9    Blood Alcohol level:  Lab Results  Component Value Date   ETH <10 10/16/2021   ETH <10 04/24/2021   Prolactin: Lab Results  Component Value Date   PROLACTIN 18.5 06/18/2022   PROLACTIN 58.3 (H) 06/06/2022   Lipid Panel: Lab Results  Component Value Date   CHOL 217 (H) 05/18/2022   TRIG 31 05/18/2022   HDL 71 05/18/2022   CHOLHDL 3.1 05/18/2022   VLDL 6 05/18/2022   LDLCALC 140 (H) 05/18/2022   LDLCALC 94 05/02/2021   HbgA1c: Lab Results  Component Value Date   HGBA1C 5.2 05/18/2022   TSH: Lab Results  Component Value Date   TSH 0.964 06/18/2022    4. Group Therapy: -- Encouraged patient to participate in unit milieu and in scheduled group therapies  -- Short Term Goals: Ability to identify changes in lifestyle to reduce recurrence of condition will improve, Ability to verbalize feelings will improve, Ability to identify and develop effective coping behaviors will improve, and Ability to identify triggers associated with substance abuse/mental health issues will improve -- Long Term Goals: Improvement in symptoms so as ready for discharge -- Patient is encouraged to participate in group therapy while admitted to the psychiatric  unit. -- We will address other chronic and acute stressors, which contributed to the patient's Schizoaffective disorder ([place]) in order to reduce the risk of self-harm at discharge.   5. Discharge Planning:  -- Social work and case management to assist with discharge planning and identification of hospital follow-up needs prior to discharge -- Estimated LOS: 5-7 days -- Discharge Concerns: Need to establish a safety plan; Medication compliance and effectiveness -- Discharge Goals: Return home with outpatient referrals for mental health follow-up including medication management/psychotherapy  I certify that inpatient services furnished can reasonably be expected to improve the patient's condition.   I discussed my assessment, planned testing and intervention for the patient with Dr. Winfred Leeds who agrees with my formulated course of action.  Rolanda Lundborg, MD, PGY-1 10/03/2022, 6:05 AM

## 2022-10-04 MED ORDER — WHITE PETROLATUM EX OINT
TOPICAL_OINTMENT | CUTANEOUS | Status: AC
  Administered 2022-10-04: 1
  Filled 2022-10-04: qty 5

## 2022-10-04 NOTE — Progress Notes (Signed)
Patient was approached by another nurse that observed her with her pants down urinating in a cup in the alcove in front of the nursing station. When asked why she did it she replied " I don't know". Support given and she was informed to go to her room and shower so that her clothes can be washed. Will continue to monitor patients behavior.

## 2022-10-04 NOTE — Progress Notes (Signed)
   10/04/22 1000  Psych Admission Type (Psych Patients Only)  Admission Status Voluntary  Psychosocial Assessment  Patient Complaints Anxiety;Depression  Eye Contact Brief  Facial Expression Flat  Affect Appropriate to circumstance  Speech Logical/coherent  Interaction Childlike;Guarded;Minimal  Motor Activity Slow  Appearance/Hygiene Unremarkable  Behavior Characteristics Cooperative;Appropriate to situation  Mood Depressed;Pleasant  Thought Process  Coherency WDL  Content WDL  Delusions None reported or observed  Perception WDL  Hallucination None reported or observed  Judgment Poor  Confusion None  Danger to Self  Current suicidal ideation? Denies  Agreement Not to Harm Self Yes  Description of Agreement verbal contract  Danger to Others  Danger to Others None reported or observed

## 2022-10-04 NOTE — [facility] Group Notes (Signed)
Adult Psychoeducational Group  Date:  10/04/2022 Time:  1300-1400  Group Topic/Focus: Continuation of the group from Saturday. Looking at the lists that were created and talking about what needs to be done with the homework of 30 positives about themselves.                                     Talking about taking their power back and helping themselves to develop a positive self esteem.      Participation Quality:  Appropriate  Affect:  Appropriate  Cognitive:  Oriented  Insight: Improving  Engagement in Group:  Engaged  Modes of Intervention:  Activity, Discussion, Education, and Support  Additional Comments:  Rates energy at a 4/10. Participated fully in the group.  Paulino Rily

## 2022-10-04 NOTE — Group Note (Signed)
[facility] LCSW Group Therapy Note  Date/Time:  10/04/2022  10:00AM-11:00AM  Type of Therapy and Topic:  Group Therapy:  Music's Effect on Depression and Anxiety  Participation Level:  None   Description of Group: In this process group, members discussed what types of music trigger them to worsening mental health symptoms and what they can do about this in the future.  For instance, we discussed what to do when riding in a friend's car and a song harmful to the patient starts playing.  We also discussed how music can be used as a tool to help with wellness and recovery in various ways including managing depression and anxiety as well as encouraging healthy sleep habits and avoiding relapse into old negative behaviors such as drinking, self-harming, or staying in bed all day.  A variety of songs were played as examples.  Discussion was elicited which showed the group to be in agreement that the songs were quite relatable and have the potential to help them outside of the hospital setting.  Therapeutic Goals: Patients will be introduced to a variety of songs that can relate to self-image and self-love in a helpful way. Patients will explore the impact of different pieces of music on their feelings, i.e. uplifting, triggering, etc. Patients will discuss how to use this self-knowledge to assist in recovery.  Summary of Patient Progress:  At the beginning of group, patient expressed that music from the 90s is upsetting to her.  She slept through the entirety of group.  Therapeutic Modalities: Solution Focused Brief Therapy Activity   Selmer Dominion, LCSW

## 2022-10-04 NOTE — [facility] Group Notes (Signed)
Adult Psychoeducational Group Note Date:  10/04/2022 Time:  0900-1000 Group Topic/Focus: PROGRESSIVE RELAXATION. A group where deep breathing is taught and tensing and relaxation muscle groups is used. Imagery is used as well.  Pts are asked to imagine 3 pillars that hold them up when they are not able to hold themselves up and to share that with the group.   Participation Level:  Active  Participation Quality:  Appropriate  Affect:  Appropriate  Cognitive:  Approprate  Insight: Improving  Engagement in Group:  Engaged  Modes of Intervention:  deep breathing, Imagery. Discussion  Additional Comments:  Rates her energy at a 4/10. States music, positive affirmations and her doctors    : Paulino Rily

## 2022-10-04 NOTE — Progress Notes (Signed)
[facility] MD Resident Progress Note  10/04/2022 10:34 AM Cheryl Adams  MRN:  [id number] Principal Problem: Schizoaffective disorder ([place]) Diagnosis: Principal Problem:   Schizoaffective disorder ([place]) Active Problems:   GAD (generalized anxiety disorder)   Vitamin D deficiency   Sialorrhea  Reason for admission   Cheryl Adams is a 31 year old African-American female with prior psychiatric diagnoses of schizoaffective d/o depressive type and GAD who initially presented to the [employer] [facility] health urgent care [facility]) accompanied by her mother with complaints of paranoia. She was previously admitted to [facility] from 6/20-10/10/2022 and treated for schizoaffective disorder, depressive type with multiple medication trials (geodon, haldol, caplyta, thorazine, zyprexa, risperdal) and ultimately had partial response to clozapine. DSS was granted legal gaurdianship. On 09/10/2022, pt was d/c for competency evaluation per court. On 09/10/22, patient was readmitted to the psych unit after the evaluation (admitted on 09/10/2022, total  LOS: 24 days )  Chart review from last 24 hours   The patient's chart was reviewed and nursing notes were reviewed. The patient's case was discussed in multidisciplinary team meeting.  - Overnight events per chart review: sat and observed peers during RT group. Attended group therapy. Sat with Chaplain and discussed court case.  Per nursing report in rounds, continues to present to guarded and paranoid with decreased energy. - Patient received all scheduled meds  - Patient did not receive any PRN meds   Information obtained during interview   The patient was seen and evaluated on the unit.  Patient primarily sitting in the dayroom sleep leaning her head on the wall with drooling noted but when awakened follows direction and walks with the provider to interview place she reports feeling okay and denies SI HI or AVH when asking her if she feels paranoid or somebody is trying to  harm her she responds in a low tone voice "can you skip this question" then when asking if she feels scared or fearful she nods her head yes but does not answer any further when asked her from what, she was reassured regarding being safe in the hospital and staff helpful for her, she presents with flat affect decreased amount tone and volume of speech.  Most of her presentation seem to be her baseline except for her paranoia which seem to have worsened recently.  She does deny side effect to medications and agrees that drooling is improved in general except when she is asleep.  She reports fair sleep and appetite and denies any problems otherwise.  Review of Systems  9 Other systems reviewed negative  Targeted review of symptoms, specific for clozapine: Malaise/Sedation: "Less tired" Chest pain: denies Shortness of breath: denies Exertional capacity: denies Tachycardia: noted on vitals  Cough: denies Sore Throat: denies Fever: none noted on vitals  Orthostatic hypotension (dizziness with standing): denies Hypersalivation: "better" Constipation: denies. .  Nausea: denies Nocturnal enuresis: denies   Symptoms of GERD -Acid reflux: denies -Burning sensation in chest, usually after eating: denies -Trouble swallowing: denies -Sensation of lump in throat: denies -Regurgitation (backwash) of food or sour liquid: denies  Objective   Blood pressure 125/88, pulse (!) 107, temperature 98.7 F (37.1 C), temperature source Oral, resp. rate 16, SpO2 99 %. There is no height or weight on file to calculate BMI. Sleep: 6.75 hours Current Medications: Current Facility-Administered Medications  Medication Dose Route Frequency Provider Last Rate Last Admin   acetaminophen (TYLENOL) tablet 650 mg  650 mg Oral Q6H PRN Janine Limbo, MD  alum & mag hydroxide-simeth (MAALOX/MYLANTA) 200-200-20 MG/5ML suspension 30 mL  30 mL Oral Q4H PRN Massengill, Ovid Curd, MD       amLODipine (NORVASC) tablet  10 mg  10 mg Oral Daily Massengill, Nathan, MD   10 mg at 10/04/22 [id number]   antiseptic oral rinse (BIOTENE) solution 15 mL  15 mL Mouth Rinse 5 X Daily PRN Massengill, Ovid Curd, MD       atenolol (TENORMIN) tablet 12.5 mg  12.5 mg Oral Daily Massengill, Nathan, MD   12.5 mg at 10/04/22 [id number]   atropine 1 % ophthalmic solution 1 drop  1 drop Sublingual TID Janine Limbo, MD   1 drop at 10/04/22 0817   cloZAPine (CLOZARIL) tablet 200 mg  200 mg Oral QHS Rolanda Lundborg, MD   200 mg at 10/03/22 2108   docusate sodium (COLACE) capsule 100 mg  100 mg Oral BID Massengill, Ovid Curd, MD   100 mg at 10/04/22 [id number]   ferrous sulfate tablet 325 mg  325 mg Oral Daily Massengill, Ovid Curd, MD   325 mg at 10/04/22 [id number]   magnesium hydroxide (MILK OF MAGNESIA) suspension 30 mL  30 mL Oral Daily PRN Massengill, Ovid Curd, MD       metFORMIN (GLUCOPHAGE-XR) 24 hr tablet 500 mg  500 mg Oral Q breakfast Massengill, Nathan, MD   500 mg at 10/04/22 0813   modafinil (PROVIGIL) tablet 100 mg  100 mg Oral Daily Massengill, Ovid Curd, MD   100 mg at 10/04/22 [id number]   polyethylene glycol (MIRALAX / GLYCOLAX) packet 17 g  17 g Oral Daily Massengill, Ovid Curd, MD   17 g at 10/04/22 0811   sertraline (ZOLOFT) tablet 100 mg  100 mg Oral QHS Rolanda Lundborg, MD   100 mg at 10/03/22 2111   traZODone (DESYREL) tablet 50 mg  50 mg Oral QHS PRN Rolanda Lundborg, MD       vitamin D3 (CHOLECALCIFEROL) tablet 1,000 Units  1,000 Units Oral Daily Janine Limbo, MD   1,000 Units at 10/04/22 [id number]   Physical Exam Constitutional:      Appearance: the patient is not toxic-appearing.  Pulmonary:     Effort: Pulmonary effort is normal.  Neurological:     General: No focal deficit present.     Mental Status: the patient is alert and oriented to person, place, time.  She is not oriented to city.  AIMS:  Facial and Oral Movements Muscles of Facial Expression: None, normal Lips and Perioral Area: None, normal Jaw: None, normal Tongue: None,  normal, Extremity Movements Upper (arms, wrists, hands, fingers): None, normal Lower (legs, knees, ankles, toes): None, normal Trunk Movements: None, normal  Neck, shoulders, hips: None, normal Overall Severity Severity of abnormal movements (highest score from questions above): None, normal Incapacitation due to abnormal movements: None, normal Patient's awareness of abnormal movements (rate only patient's report): No Awareness, Dental Status Current problems with teeth and/or dentures?: No Does patient usually wear dentures?: No   No stiffness, cogwheeling, or tremors noted on exam.    Mental Status Exam   Appearance and Grooming: Below average hygiene, disheveled Motor activity: Mild psychomotor retardation noted, at baseline Behavior: Calm and cooperative but guarded Attitude: Cooperative and responding to questions but guarded in general Speech: Delayed with decreased amount but at baseline Mood: "Okay" Affect: Flat at baseline ------------------------------------------------------------------------------------------------------------------------- Thought Content SI HI or AVH, presents paranoid suspicious and guarded Thought Process Linear yet concrete Insight Limited Judgement Limited  Assets: Physical Health; Resilience  Treatment Plan Summary: Daily contact with patient  to assess and evaluate symptoms and progress in treatment and Medication management Summary   Diagnoses / Active Problems: Schizoaffective disorder (HCC) Principal Problem:   Schizoaffective disorder (HCC) Active Problems:   GAD (generalized anxiety disorder)   Vitamin D deficiency   Sialorrhea  Cheryl Adams is a 31 year old African-American female with prior psychiatric diagnoses of schizoaffective d/o depressive type and GAD who is currently under DSS guardianship.  She continues to have ongoing paranoia and continues to have depressed mood. We will first target her ongoing paranoia by up  titrating her clozapine and monitor her on the increased dose of clozapine prior to increasing her modafinil or Zoloft.  Plan  Safety and Monitoring: -- Voluntary admission to inpatient psychiatric unit for safety, stabilization and treatment -- Daily contact with patient to assess and evaluate symptoms and progress in treatment -- Patient's case to be discussed in multi-disciplinary team meeting -- Observation Level : q15 minute checks -- Vital signs:  q12 hours -- Precautions: suicide, elopement, and assault  2. Medications:  #Schizoaffective disorder, bipolar type Given that she just increased Clozaril yesterday, we will monitor her mood on the current dose prior to increasing modafinil or Zoloft.  We will monitor the next few days and consider titrating modafinil to 200 mg to help with energy and motivation or titrating Zoloft to 150 mg to help with depression. -- Continue clozapine 200mg  qhs - for psychosis, ANC wnl (10/31). Next check 11/7.  -- Continue atropine drops 1% 1 drop tid for sialorrhea.  -- Continue modafinil 100mg  qam - for daytime sedation 2/2 clozapine and negative symptoms of schizophrenia. -- Continue Zoloft 100 mg QHS for depression -- low threshold to repeat EKG    #HTN -- Continue Norvasc 10 mg daily for hypertension   #Tachycardia HR slightly elevated this AM.  Suspect side effect of clozapine. She denies palpitations.  Will consider increasing atenolol to 25 mg if tachycardia persists - Continue atenolol 12.5 mg daily - for tachycardia 2/2 clozapine  - Continue to monitor, low threshold to obtain EKG    #Constipation Has been having regular bowel movements daily -Continue Colace 100 mg 2 twice daily for constipation -Continue Miralax daily for constipation   #Nutritional needs  -- Continue Ensure TID for nutritional support   #Iron deficiency  -- Continue Ferrous Sulfate 325 mg daily for iron replacement   #Weight gain Ppx -- Continue Metformin 500  mg XL for weight gain prophylaxis   #Anxiety -- Continue Atarax 25 mg TID PRN for anxiety   No tobacco use -- Patient does not need nicotine replacement  The risks/benefits/side-effects/alternatives to the above medication were discussed in detail with the patient and time was given for questions. The patient consents to medication trial. FDA black box warnings, if present, were discussed.  The patient is agreeable with the medication plan, as above. We will monitor the patient's response to pharmacologic treatment, and adjust medications as necessary.  3. Routine and other pertinent labs: EKG monitoring: QTc:  450 (8/28)  Lab Results:     Latest Ref Rng & Units 09/29/2022    6:47 AM 09/22/2022    6:11 PM 09/15/2022    7:09 AM  CBC  WBC 4.0 - 10.5 K/uL 4.6  6.0  4.1   Hemoglobin 12.0 - 15.0 g/dL 09.6  28.3  66.2   Hematocrit 36.0 - 46.0 % 39.6  41.8  39.6   Platelets 150 - 400 K/uL 236  271  240  Latest Ref Rng & Units 06/18/2022    6:27 PM 05/31/2022    6:42 AM 05/29/2022    6:22 PM  BMP  Glucose 70 - 99 mg/dL 117  101  124   BUN 6 - 20 mg/dL 11  6  9    Creatinine 0.44 - 1.00 mg/dL 0.70  0.82  0.79   Sodium 135 - 145 mmol/L 141  137  140   Potassium 3.5 - 5.1 mmol/L 3.5  3.9  3.9   Chloride 98 - 111 mmol/L 109  103  106   CO2 22 - 32 mmol/L 26  28  26    Calcium 8.9 - 10.3 mg/dL 9.3  8.9  8.9    Blood Alcohol level:  Lab Results  Component Value Date   ETH <10 10/16/2021   ETH <10 04/24/2021   Prolactin: Lab Results  Component Value Date   PROLACTIN 18.5 06/18/2022   PROLACTIN 58.3 (H) 06/06/2022   Lipid Panel: Lab Results  Component Value Date   CHOL 217 (H) 05/18/2022   TRIG 31 05/18/2022   HDL 71 05/18/2022   CHOLHDL 3.1 05/18/2022   VLDL 6 05/18/2022   LDLCALC 140 (H) 05/18/2022   LDLCALC 94 05/02/2021   HbgA1c: Lab Results  Component Value Date   HGBA1C 5.2 05/18/2022   TSH: Lab Results  Component Value Date   TSH 0.964 06/18/2022    4.  Group Therapy: -- Encouraged patient to participate in unit milieu and in scheduled group therapies  -- Short Term Goals: Ability to identify changes in lifestyle to reduce recurrence of condition will improve, Ability to verbalize feelings will improve, Ability to identify and develop effective coping behaviors will improve, and Ability to identify triggers associated with substance abuse/mental health issues will improve -- Long Term Goals: Improvement in symptoms so as ready for discharge -- Patient is encouraged to participate in group therapy while admitted to the psychiatric unit. -- We will address other chronic and acute stressors, which contributed to the patient's Schizoaffective disorder ([place]) in order to reduce the risk of self-harm at discharge.   5. Discharge Planning:  -- Social work and case management to assist with discharge planning and identification of hospital follow-up needs prior to discharge -- Estimated LOS: 5-7 days -- Discharge Concerns: Need to establish a safety plan; Medication compliance and effectiveness -- Discharge Goals: Return home with outpatient referrals for mental health follow-up including medication management/psychotherapy   I certify that inpatient services furnished can reasonably be expected to improve the patient's condition.     Zyaire Dumas Winfred Leeds, MD, 10/04/2022, 10:34 AM

## 2022-10-04 NOTE — Progress Notes (Signed)
Psychoeducational Group Note  Date:  10/04/2022 Time:  2015  Group Topic/Focus:  Wrap up group  Participation Level: Did Not Attend  Participation Quality:  Not Applicable  Affect:  Not Applicable  Cognitive:  Not Applicable  Insight:  Not Applicable  Engagement in Group: Not Applicable  Additional Comments:  Did not attend.   Shellia Cleverly 10/04/2022, 9:43 PM

## 2022-10-04 NOTE — Progress Notes (Signed)
   10/04/22 0000  Psych Admission Type (Psych Patients Only)  Admission Status Voluntary  Psychosocial Assessment  Patient Complaints Anxiety;Depression  Eye Contact Darting  Facial Expression Flat  Affect Appropriate to circumstance  Speech Logical/coherent  Interaction Minimal;Guarded  Motor Activity Slow  Appearance/Hygiene Unremarkable  Behavior Characteristics Appropriate to situation  Mood Pleasant  Thought Process  Coherency WDL  Content WDL  Delusions None reported or observed  Perception WDL  Hallucination None reported or observed  Judgment Poor  Confusion None  Danger to Self  Current suicidal ideation? Denies  Agreement Not to Harm Self Yes  Description of Agreement verbal  Danger to Others  Danger to Others None reported or observed

## 2022-10-04 NOTE — Progress Notes (Signed)
   10/04/22 2200  Psych Admission Type (Psych Patients Only)  Admission Status Voluntary  Psychosocial Assessment  Patient Complaints None  Eye Contact Avoids  Facial Expression Flat  Affect Appropriate to circumstance  Speech Soft  Interaction Childlike  Motor Activity Slow  Appearance/Hygiene Unremarkable  Behavior Characteristics Cooperative;Appropriate to situation  Mood Pleasant;Preoccupied  Thought Process  Coherency WDL  Content WDL  Delusions WDL  Perception WDL  Hallucination None reported or observed  Judgment Poor  Confusion None  Danger to Self  Current suicidal ideation? Denies

## 2022-10-05 MED ORDER — SERTRALINE HCL 100 MG PO TABS
100.0000 mg | ORAL_TABLET | Freq: Every day | ORAL | Status: DC
  Administered 2022-10-06 – 2022-10-07 (×2): 100 mg via ORAL
  Filled 2022-10-05 (×4): qty 1

## 2022-10-05 MED ORDER — CLOZAPINE 25 MG PO TABS
225.0000 mg | ORAL_TABLET | Freq: Every day | ORAL | Status: DC
  Administered 2022-10-05: 225 mg via ORAL
  Filled 2022-10-05 (×3): qty 1

## 2022-10-05 NOTE — Progress Notes (Signed)
Adult Psychoeducational Group Note  Date:  10/05/2022 Time:  8:41 PM  Group Topic/Focus:  Wrap-Up Group:   The focus of this group is to help patients review their daily goal of treatment and discuss progress on daily workbooks.  Participation Level:  Active  Participation Quality:  Appropriate  Affect:  Appropriate  Cognitive:  Appropriate  Insight: Appropriate  Engagement in Group:  Engaged  Modes of Intervention:  Discussion and Support  Additional Comments:   Pt attended and participated in the [place] group. Pt rated her day 7/10. Pt smiled and laughed as she  shared that she was happy to be back on the 500 hall. Pt reports some progress toward improving her health by exercising and walking. Pt reports using meditation, walking and deep breathing to improve coping.  Cheryl Adams 10/05/2022, 8:41 PM

## 2022-10-05 NOTE — Group Note (Signed)
Recreation Therapy Group Note   Group Topic:Stress Management  Group Date: 10/05/2022 Start Time: 0930 End Time: 0945 Facilitators: Rosena Bartle-McCall, LRT,CTRS Location: [address] Dayroom   Goal Area(s) Addresses:  Patient will actively participate in stress management techniques presented during session.  Patient will successfully identify benefit of practicing stress management post d/c.    Group Description: Guided Imagery. LRT provided education, instruction, and demonstration on practice of visualization via guided imagery. Patient was asked to participate in the technique introduced during session. LRT debriefed including topics of mindfulness, stress management and specific scenarios each patient could use these techniques. Patients were given suggestions of ways to access scripts post d/c and encouraged to explore Youtube and other apps available on smartphones, tablets, and computers.   Affect/Mood: Flat   Participation Level: Active   Participation Quality: Independent   Behavior: Reserved   Speech/Thought Process: Barely audible    Insight: Fair   Judgement: Fair    Modes of Intervention: Script, [place]   Patient Response to Interventions:  Engaged   Education Outcome:  Acknowledges education and In group clarification offered    Clinical Observations/Individualized Feedback: Pt attended and participated in group session.     Plan: Continue to engage patient in RT group sessions 2-3x/week.   Toniesha Zellner-McCall, LRT,CTRS 10/05/2022 11:56 AM

## 2022-10-05 NOTE — [facility] Group Notes (Signed)
Spiritual care group on grief and loss facilitated by chaplain Janne Napoleon, [facility]  Group Goal:  Support / Education around grief and loss  Members engage in facilitated group support and psycho-social education.  Group Description:  Following introductions and group rules, group members engaged in facilitated group dialog and support around topic of loss, with particular support around experiences of loss in their lives. Group Identified types of loss (relationships / self / things) and identified patterns, circumstances, and changes that precipitate losses. Reflected on thoughts / feelings around loss, normalized grief responses, and recognized variety in grief experience. Group noted Worden's four tasks of grief in discussion.  Group drew on Adlerian / Rogerian, narrative, MI,  Patient Progress: Cheryl Adams attended group but did not participate.  [address] [place] Pager, [phone]

## 2022-10-05 NOTE — [facility] Counselor (Signed)
CSW spoke with Devoria Albe [phone] (Legal Guardian) who states that the group home that had originally denied the Pt has reconsidered and would like to interview with the Pt on 10/06/2022 at 10:00am.  CSW agreed to have the Pt meet with the group home owners through [employer] on 10/06/2022 at 10:00am.  CSW will update the providers with any information as it is received.

## 2022-10-05 NOTE — Group Note (Signed)
[facility] LCSW Group Therapy Note    Group Date: 10/05/2022 Start Time: 1300 End Time: 1400  Type of Therapy and Topic:  Group Therapy:  Overcoming Obstacles  Participation Level:  [facility] PARTICIPATION LEVEL: Minimal    Description of Group:   In this group patients will be encouraged to explore what they see as obstacles to their own wellness and recovery. They will be guided to discuss their thoughts, feelings, and behaviors related to these obstacles. The group will process together ways to cope with barriers, with attention given to specific choices patients can make. Each patient will be challenged to identify changes they are motivated to make in order to overcome their obstacles. This group will be process-oriented, with patients participating in exploration of their own experiences as well as giving and receiving support and challenge from other group members.  Therapeutic Goals: 1. Patient will identify personal and current obstacles as they relate to admission. 2. Patient will identify barriers that currently interfere with their wellness or overcoming obstacles.  3. Patient will identify feelings, thought process and behaviors related to these barriers. 4. Patient will identify two changes they are willing to make to overcome these obstacles:    Summary of Patient Progress Patient during group had minimal insight when asking her a question. Patient did participate in group by giving an example of a change she wanted to make.  Patient on into the group did not have much to say when asking her a question.   Therapeutic Modalities:   Cognitive Behavioral Therapy Solution Focused Therapy Motivational Interviewing Relapse Prevention Therapy   Sherre Lain, LCSWA

## 2022-10-05 NOTE — Progress Notes (Addendum)
[facility] MD Progress Note  10/05/2022 4:55 PM Cheryl Adams  MRN:  [id number]  Subjective:    Cheryl Adams is a 31 year old African-American female with prior psychiatric diagnoses of schizoaffective d/o depressive type and GAD who initially presented to the [employer] [facility] health urgent care [facility]) accompanied by her mother with complaints of paranoia. She was previously admitted to [facility] from 6/20-10/10/2022 and treated for schizoaffective disorder, depressive type with multiple medication trials (geodon, haldol, caplyta, thorazine, zyprexa, risperdal) and ultimately had partial response to clozapine. DSS was granted legal gaurdianship. On 09/10/2022, pt was d/c for competency evaluation per court. On 09/10/22, patient was readmitted to the psych unit after the evaluation.  Pt is more paranoid today, feeling unsafe on unit and that others on the unit could be threatening to harm her. Pt reports also feeling more anxious and more depressed. Also has odd behaviors per nursing such as urinating in a cup and leaving it in the common area on the unit.  Reports sleep is okay and appetite is okay. Concentration is poorer.  Denying SI and HI. Denying AH, VH.   Pt agreeable with move to 500 hall due to increased paranoia and closer monitoring of medication adherence and cheeking.  Targeted review of symptoms, specific for clozapine: Malaise/Sedation: has some daytime fatigue Chest pain: denies Shortness of breath: denies Exertional capacity: denies Tachycardia: noted on vitals  Cough: denies Sore Throat: denies Fever: none noted on vitals  Orthostatic hypotension (dizziness with standing): denies Hypersalivation: at low level Constipation: denies  Nausea: denies Nocturnal enuresis: denies    Principal Problem: Schizoaffective disorder (HCC) Diagnosis: Principal Problem:   Schizoaffective disorder (HCC) Active Problems:   GAD (generalized anxiety disorder)   Vitamin D deficiency    Sialorrhea  Total Time spent with patient: 20 minutes  Past Psychiatric History:  MDD, GAD, ultimately diagnosed with schizoaffective disorder depressive type.    Past Medical History:  Past Medical History:  Diagnosis Date   Anemia    Chronic tonsillitis 10/2014   Cough 11/09/2014   Difficulty swallowing pills    No psychiatric disorder found after evaluation 04/14/2022    Past Surgical History:  Procedure Laterality Date   TONSILLECTOMY AND ADENOIDECTOMY Bilateral 11/14/2014   Procedure: BILATERAL TONSILLECTOMY AND ADENOIDECTOMY;  Surgeon: Jodi Marble, MD;  Location: [place];  Service: ENT;  Laterality: Bilateral;   TYMPANOSTOMY TUBE PLACEMENT     Family History: History reviewed. No pertinent family history.  Family Psychiatric  History:  Schizophrenia- Maternal grandfather.    Social History:  Social History   Substance and Sexual Activity  Alcohol Use No     Social History   Substance and Sexual Activity  Drug Use No    Social History   Socioeconomic History   Marital status: Single    Spouse name: Not on file   Number of children: Not on file   Years of education: Not on file   Highest education level: Not on file  Occupational History   Not on file  Tobacco Use   Smoking status: Never   Smokeless tobacco: Never  Substance and Sexual Activity   Alcohol use: No   Drug use: No   Sexual activity: Not on file  Other Topics Concern   Not on file  Social History Narrative   Not on file   Social Determinants of Health   Financial Resource Strain: Not on file  Food Insecurity: Unknown (09/10/2022)   Hunger Vital Sign    Worried  About Running Out of Food in the Last Year: Patient refused    Ran Out of Food in the Last Year: Patient refused  Transportation Needs: Unknown (09/10/2022)   PRAPARE - Administrator, Civil Service (Medical): Patient refused    Lack of Transportation (Non-Medical): Patient refused  Physical  Activity: Not on file  Stress: Not on file  Social Connections: Not on file   Additional Social History:                         Sleep: Fair  Appetite:  Fair  Current Medications: Current Facility-Administered Medications  Medication Dose Route Frequency Provider Last Rate Last Admin   acetaminophen (TYLENOL) tablet 650 mg  650 mg Oral Q6H PRN Patriece Archbold, Harrold Donath, MD       alum & mag hydroxide-simeth (MAALOX/MYLANTA) 200-200-20 MG/5ML suspension 30 mL  30 mL Oral Q4H PRN Kaelani Kendrick, MD       amLODipine (NORVASC) tablet 10 mg  10 mg Oral Daily Martez Weiand, MD   10 mg at 10/05/22 0755   antiseptic oral rinse (BIOTENE) solution 15 mL  15 mL Mouth Rinse 5 X Daily PRN Dequan Kindred, Harrold Donath, MD       atenolol (TENORMIN) tablet 12.5 mg  12.5 mg Oral Daily Cyd Hostler, MD   12.5 mg at 10/05/22 0755   atropine 1 % ophthalmic solution 1 drop  1 drop Sublingual TID Idil Maslanka, Harrold Donath, MD   1 drop at 10/05/22 1639   cloZAPine (CLOZARIL) tablet 225 mg  225 mg Oral QHS Salvador Bigbee, MD       docusate sodium (COLACE) capsule 100 mg  100 mg Oral BID Omeed Osuna, Harrold Donath, MD   100 mg at 10/05/22 1639   ferrous sulfate tablet 325 mg  325 mg Oral Daily Marris Frontera, Harrold Donath, MD   325 mg at 10/05/22 0755   magnesium hydroxide (MILK OF MAGNESIA) suspension 30 mL  30 mL Oral Daily PRN Ailie Gage, Harrold Donath, MD       metFORMIN (GLUCOPHAGE-XR) 24 hr tablet 500 mg  500 mg Oral Q breakfast Armari Fussell, MD   500 mg at 10/05/22 0757   polyethylene glycol (MIRALAX / GLYCOLAX) packet 17 g  17 g Oral Daily Brisha Mccabe, Harrold Donath, MD   17 g at 10/05/22 0754   [START ON 10/06/2022] sertraline (ZOLOFT) tablet 100 mg  100 mg Oral Daily Ludy Messamore, MD       traZODone (DESYREL) tablet 50 mg  50 mg Oral QHS PRN Karie Fetch, MD       vitamin D3 (CHOLECALCIFEROL) tablet 1,000 Units  1,000 Units Oral Daily Phineas Inches, MD   1,000 Units at 10/05/22 0755    Lab Results: No results  found for this or any previous visit (from the past 48 hour(s)).  Blood Alcohol level:  Lab Results  Component Value Date   ETH <10 10/16/2021   ETH <10 04/24/2021    Metabolic Disorder Labs: Lab Results  Component Value Date   HGBA1C 5.2 05/18/2022   MPG 102.54 05/18/2022   MPG 120 04/29/2021   Lab Results  Component Value Date   PROLACTIN 18.5 06/18/2022   PROLACTIN 58.3 (H) 06/06/2022   Lab Results  Component Value Date   CHOL 217 (H) 05/18/2022   TRIG 31 05/18/2022   HDL 71 05/18/2022   CHOLHDL 3.1 05/18/2022   VLDL 6 05/18/2022   LDLCALC 140 (H) 05/18/2022   LDLCALC 94 05/02/2021    Physical Findings: AIMS: Facial  and Oral Movements Muscles of Facial Expression: None, normal Lips and Perioral Area: None, normal Jaw: None, normal Tongue: None, normal,Extremity Movements Upper (arms, wrists, hands, fingers): None, normal Lower (legs, knees, ankles, toes): None, normal, Trunk Movements Neck, shoulders, hips: None, normal, Overall Severity Severity of abnormal movements (highest score from questions above): None, normal Incapacitation due to abnormal movements: None, normal Patient's awareness of abnormal movements (rate only patient's report): No Awareness, Dental Status Current problems with teeth and/or dentures?: No Does patient usually wear dentures?: No  CIWA:    COWS:     Musculoskeletal: Strength & Muscle Tone: within normal limits Gait & Station: normal Patient leans: N/A  Psychiatric Specialty Exam:  Presentation  General Appearance:  Casual  Eye Contact: Poor  Speech: Slow  Speech Volume: Decreased  Handedness: Right   Mood and Affect  Mood: Depressed; Anxious  Affect: Flat; Tearful   Thought Process  Thought Processes: Linear  Descriptions of Associations:Intact  Orientation:Full (Time, Place and Person)  Thought Content:Paranoid Ideation  History of Schizophrenia/Schizoaffective disorder:Yes  Duration of  Psychotic Symptoms:Greater than six months  Hallucinations:Hallucinations: None  Ideas of Reference:Paranoia; Delusions  Suicidal Thoughts:Suicidal Thoughts: No  Homicidal Thoughts:Homicidal Thoughts: No   Sensorium  Memory: Immediate Good; Recent Good; Remote Good  Judgment: Fair  Insight: Fair   Chartered certified accountant: Fair  Attention Span: Fair  Recall: [employer] of Knowledge: Fair  Language: Fair   Psychomotor Activity  Psychomotor Activity: Psychomotor Activity: Normal   Assets  Assets: Physical Health; Resilience   Sleep  Sleep: Sleep: Fair    Physical Exam: Physical Exam Vitals reviewed.    Review of Systems  Psychiatric/Behavioral:  Positive for depression. Negative for hallucinations, substance abuse and suicidal ideas. The patient is nervous/anxious. The patient does not have insomnia.    Blood pressure 128/86, pulse 82, temperature 98.9 F (37.2 C), temperature source Oral, resp. rate 16, SpO2 100 %. There is no height or weight on file to calculate BMI.   Treatment Plan Summary: Daily contact with patient to assess and evaluate symptoms and progress in treatment  Assessment: Diagnoses / Active Problems:   Schizoaffective disorder (HCC)   GAD (generalized anxiety disorder)   Vitamin D deficiency   Sialorrhea  Plan  Safety and Monitoring: -- Voluntary admission to inpatient psychiatric unit for safety, stabilization and treatment -- Daily contact with patient to assess and evaluate symptoms and progress in treatment -- Patient's case to be discussed in multi-disciplinary team meeting -- Observation Level : q15 minute checks -- Vital signs:  q12 hours -- Precautions: suicide, elopement, and assault   2. Medications:  #Schizoaffective disorder, bipolar type -- Increase clozapine from 200mg  to 225 mg qhs - for psychosis. Cbc-d and clozapine level ordered for 11-7. Concern that paranoia is worse due to lowered  clozapine dose but also concern for cheeking (pt has done this multiple times in the past).  -- Continue atropine drops 1% 1 drop tid for sialorrhea.  -- Discontinue modafinil 100mg  qam - due to worsening psychosis -- Change  Zoloft 100 mg from at bedtime to in the morning - concern pt is cheeking clozapine at night, and other meds she also gets at night, pt is more anxious depressed and tearful    #HTN -- Continue Norvasc 10 mg daily for hypertension   #Tachycardia HR slightly elevated this AM.  Suspect side effect of clozapine. She denies palpitations.  Will consider increasing atenolol to 25 mg if tachycardia persists - Continue atenolol  12.5 mg daily - for tachycardia 2/2 clozapine  - Continue to monitor, low threshold to obtain EKG    #Constipation Has been having regular bowel movements daily -Continue Colace 100 mg 2 twice daily for constipation -Continue Miralax daily for constipation   #Nutritional needs  -- Continue Ensure TID for nutritional support   #Iron deficiency  -- Continue Ferrous Sulfate 325 mg daily for iron replacement   #Weight gain Ppx -- Continue Metformin 500 mg XL for weight gain prophylaxis   #Anxiety -- Continue Atarax 25 mg TID PRN for anxiety   No tobacco use -- Patient does not need nicotine replacement     The patient is agreeable with the medication plan, as above. We will monitor the patient's response to pharmacologic treatment, and adjust medications as necessary.   Cristy Hilts, MD 10/05/2022, 4:55 PM  Total Time Spent in Direct Patient Care:  I personally spent 35 minutes on the unit in direct patient care. The direct patient care time included face-to-face time with the patient, reviewing the patient's chart, communicating with other professionals, and coordinating care. Greater than 50% of this time was spent in counseling or coordinating care with the patient regarding goals of hospitalization, psycho-education, and discharge  planning needs.   Phineas Inches, MD Psychiatrist

## 2022-10-05 NOTE — Progress Notes (Signed)
Pt sitting by self on floor during free time today. Pt is attending groups with encouragement. Pt pleasant on approach. Pt eye contact remains minimal. Pt reports sleep was decreased last night. Some paranoia noted. Q 15 minute checks ongoing for safety.

## 2022-10-05 NOTE — Progress Notes (Signed)
Patient was approached by this RN, patient was pleasant , cooperative, and able to answer questions. Denies any incontinence incidents. Patient states "I feel like my day has been a 7/10." Patient alert and oriented. Denies SI/Hi at this time. Patient alert and oriented at this time. Patient verbalizes her understanding of safety measures. No complaints at this time.

## 2022-10-06 LAB — SARS CORONAVIRUS 2 BY RT PCR: SARS Coronavirus 2 by RT PCR: NEGATIVE

## 2022-10-06 MED ORDER — TUBERCULIN PPD 5 UNIT/0.1ML ID SOLN
5.0000 [IU] | Freq: Once | INTRADERMAL | Status: AC
  Administered 2022-10-06: 5 [IU] via INTRADERMAL
  Filled 2022-10-06: qty 0.1

## 2022-10-06 MED ORDER — CLOZAPINE 25 MG PO TABS
50.0000 mg | ORAL_TABLET | Freq: Every day | ORAL | Status: DC
  Administered 2022-10-06 – 2022-10-09 (×4): 50 mg via ORAL
  Filled 2022-10-06 (×5): qty 2

## 2022-10-06 NOTE — Group Note (Signed)
Recreation Therapy Group Note   Group Topic:Self-Esteem  Group Date: 10/06/2022 Start Time: 1000 End Time: 3664 Facilitators: Shanteria Laye-McCall, LRT,CTRS Location: [address] Dayroom   Goal Area(s) Addresses:  Patient will successfully identify positive attributes about themselves.  Patient will identify healthy ways to increase self-esteem. Patient will acknowledge benefit(s) of improved self-esteem.   Group Description:  Wellsite geologist.  LRT and patients discussed how the views of others impact how we feel about ourselves.  Patients also discussed the importance of having positive thoughts about one's self.  LRT then gave patients a blank outline of a picture frame.  Patients were to take the picture frame and create a picture of themselves using positive images and language. In addition, to the blank picture frames, patients were given markers to create their vision.  LRT also played music for patients as they worked on [employer]   Affect/Mood: Flat and Sleepy   Participation Level: Minimal   Participation Quality: Independent   Behavior: Cooperative and Reserved   Speech/Thought Process: Coherent and Rational   Insight: Fair   Judgement: Fair    Modes of Intervention: Art and Music   Patient Response to Interventions:  Receptive   Education Outcome:  Acknowledges education and In group clarification offered    Clinical Observations/Individualized Feedback: Pt was quiet but engaged when prompted.  Pt appeared drowsy for duration of group.  Pt drew a picture of herself with glasses and a curly puff hair style.  Pt also described herself as resourceful, empathic and creative.     Plan: Continue to engage patient in RT group sessions 2-3x/week.   Daimon Kean-McCall, LRT,CTRS 10/06/2022 12:03 PM

## 2022-10-06 NOTE — Progress Notes (Signed)
Tuberculin 0.1 ml/5 units placed inside left forearm at 1603. To be read in 48-72 hrs.

## 2022-10-06 NOTE — Progress Notes (Signed)
The focus of this group is to help patients review their daily goal of treatment and discuss progress on daily workbooks.  Pt attended the evening group but responded minimally to discussion prompts from the [place]. Pt shared that today was a good day on the unit, the highlight of which was getting to go outside to the courtyard.  On the subject of staying well upon discharge, Aydan mentioned wanting to focus on maintaining good personal hygiene and also meditating.  Pt rated her day a 4 out of 10 and she appeared distracted during wrap-up except when directly engaged.

## 2022-10-06 NOTE — BHH Counselor (Addendum)
CSW had a meeting with Cheryl Adams (legal guardian), Cheryl Adams (Supervisor), Cheryl Adams (DSS Placement Coordinator), Cheryl Adams and Cheryl Adams (Owner and QP of group home).  This meeting was held at 12:00pm by [place].  The Pt was present with CSW for this meeting.  Cheryl Adams states that their facility hold 6 individuals.  She states that these individuals go to a day program Monday through Friday.  She states that the other 5 individuals (3 women and 2 men) are older and that all the women are in their 18's.  She states that they are located in the [place] area and have a 24 hour staff person who also administers medications daily.  She states that the facility has been "co-ed" for 2 months.  She states that the facility will be moving to another facility in a few months and that the Pt will share a bedroom with another woman.  The Pt was asked if she had any questions or concerns and she stated "no".  The Pt was asked how she feels about leaving the hospital.  She states "I am nervous and not sure that I ever want to leave the hospital".  The Pt states that she feels safe in the hospital.  CSW explained to the Pt that she cannot live in the hospital and that there will be things that she can enjoy at the group home such as having her own belongings and getting to eat the foods that she likes.  The Pt is open to going to the group home at this time.  The group home states that they will need an updated FL2, 30 days of medications or a 30 day prescription, a discharge summary, a TB test, and a Covid test.  They stated that would let the legal guardian, Cheryl Adams know today if they are interested in accepting her to the facility.    CSW contacted Cheryl Adams at 3:00pm and she states that they are working on [employer] and funding right now. CSW asked Cheryl Adams if the hospital should start working on the other items requested.  She stated "yes and once funding is worked out we will work on getting her to the facility".   CSW will follow-up with Cheryl Adams on Thursday 10/08/2022 to gather any additional information.  CSW will inform the providers so they can order the Covid test and TB test.

## 2022-10-06 NOTE — Progress Notes (Signed)
   10/06/22 1100  Psych Admission Type (Psych Patients Only)  Admission Status Voluntary  Psychosocial Assessment  Patient Complaints None  Eye Contact Brief  Facial Expression Flat  Affect Appropriate to circumstance  Speech Soft  Interaction Childlike  Motor Activity Slow  Appearance/Hygiene Unremarkable  Behavior Characteristics Cooperative  Mood Pleasant  Thought Process  Coherency WDL  Content Paranoia  Delusions None reported or observed  Perception WDL  Hallucination None reported or observed  Judgment Poor  Confusion WDL  Danger to Self  Current suicidal ideation? Denies  Agreement Not to Harm Self Yes  Description of Agreement Verbal  Danger to Others  Danger to Others None reported or observed

## 2022-10-06 NOTE — Progress Notes (Signed)
   10/06/22 0533  Sleep  Number of Hours 7.25

## 2022-10-06 NOTE — Progress Notes (Addendum)
[facility] MD Progress Note  10/06/2022 2:29 PM Cheryl Adams  MRN:  [id number]  Subjective:    Cheryl Adams is a 31 year old African-American female with prior psychiatric diagnoses of schizoaffective d/o depressive type and GAD who initially presented to the [facility] urgent care [facility]) accompanied by her mother with complaints of paranoia. She was previously admitted to [facility] from 6/20-10/10/2022 and treated for schizoaffective disorder, depressive type with multiple medication trials (geodon, haldol, caplyta, thorazine, zyprexa, risperdal) and ultimately had partial response to clozapine. DSS was granted legal gaurdianship. On 09/10/2022, pt was d/c for competency evaluation per court. On 09/10/22, patient was readmitted to the psych unit after the evaluation.  Pt is still paranoid today, without significant change from yesterday, feeling unsafe on unit and that others on the unit could be threatening to harm her. Pt reports that  anxiety and depression are about the same in severity, but she is not tearful during interview today which is an improvement. Reports sleep is okay and appetite is okay. Concentration is worse over the last 1 week.  Denying SI and HI. Denying AH, VH.     Targeted review of symptoms, specific for clozapine: Malaise/Sedation: has some daytime fatigue Chest pain: denies Shortness of breath: denies Exertional capacity: denies Tachycardia: noted on vitals  Cough: denies Sore Throat: denies Fever: none noted on vitals  Orthostatic hypotension (dizziness with standing): denies Hypersalivation: at low level Constipation: denies  Nausea: denies Nocturnal enuresis: denies    Principal Problem: Schizoaffective disorder (HCC) Diagnosis: Principal Problem:   Schizoaffective disorder (HCC) Active Problems:   GAD (generalized anxiety disorder)   Vitamin D deficiency   Sialorrhea  Total Time spent with patient: 20 minutes  Past Psychiatric History:   MDD, GAD, ultimately diagnosed with schizoaffective disorder depressive type.    Past Medical History:  Past Medical History:  Diagnosis Date   Anemia    Chronic tonsillitis 10/2014   Cough 11/09/2014   Difficulty swallowing pills    No psychiatric disorder found after evaluation 04/14/2022    Past Surgical History:  Procedure Laterality Date   TONSILLECTOMY AND ADENOIDECTOMY Bilateral 11/14/2014   Procedure: BILATERAL TONSILLECTOMY AND ADENOIDECTOMY;  Surgeon: Flo Shanks, MD;  Location: [name];  Service: ENT;  Laterality: Bilateral;   TYMPANOSTOMY TUBE PLACEMENT     Family History: History reviewed. No pertinent family history.  Family Psychiatric  History:  Schizophrenia- Maternal grandfather.    Social History:  Social History   Substance and Sexual Activity  Alcohol Use No     Social History   Substance and Sexual Activity  Drug Use No    Social History   Socioeconomic History   Marital status: Single    Spouse name: Not on file   Number of children: Not on file   Years of education: Not on file   Highest education level: Not on file  Occupational History   Not on file  Tobacco Use   Smoking status: Never   Smokeless tobacco: Never  Substance and Sexual Activity   Alcohol use: No   Drug use: No   Sexual activity: Not on file  Other Topics Concern   Not on file  Social History Narrative   Not on file   Social Determinants of Health   Financial Resource Strain: Not on file  Food Insecurity: Unknown (09/10/2022)   Hunger Vital Sign    Worried About Running Out of Food in the Last Year: Patient refused    Ran  Out of Food in the Last Year: Patient refused  Transportation Needs: Unknown (09/10/2022)   PRAPARE - Administrator, Civil Service (Medical): Patient refused    Lack of Transportation (Non-Medical): Patient refused  Physical Activity: Not on file  Stress: Not on file  Social Connections: Not on file    Additional Social History:                         Sleep: Fair  Appetite:  Fair  Current Medications: Current Facility-Administered Medications  Medication Dose Route Frequency Provider Last Rate Last Admin   acetaminophen (TYLENOL) tablet 650 mg  650 mg Oral Q6H PRN Deaundra Dupriest, Harrold Donath, MD       alum & mag hydroxide-simeth (MAALOX/MYLANTA) 200-200-20 MG/5ML suspension 30 mL  30 mL Oral Q4H PRN Rossie Bretado, MD       amLODipine (NORVASC) tablet 10 mg  10 mg Oral Daily Tamieka Rancourt, MD   10 mg at 10/06/22 0746   antiseptic oral rinse (BIOTENE) solution 15 mL  15 mL Mouth Rinse 5 X Daily PRN Vikash Nest, Harrold Donath, MD       atenolol (TENORMIN) tablet 12.5 mg  12.5 mg Oral Daily Kelee Cunningham, MD   12.5 mg at 10/06/22 0746   atropine 1 % ophthalmic solution 1 drop  1 drop Sublingual TID Phineas Inches, MD   1 drop at 10/06/22 1200   cloZAPine (CLOZARIL) tablet 225 mg  225 mg Oral QHS Destine Ambroise, Harrold Donath, MD   225 mg at 10/05/22 2046   docusate sodium (COLACE) capsule 100 mg  100 mg Oral BID Milus Fritze, Harrold Donath, MD   100 mg at 10/06/22 0746   ferrous sulfate tablet 325 mg  325 mg Oral Daily Jaymz Traywick, Harrold Donath, MD   325 mg at 10/06/22 0746   magnesium hydroxide (MILK OF MAGNESIA) suspension 30 mL  30 mL Oral Daily PRN Elener Custodio, Harrold Donath, MD       metFORMIN (GLUCOPHAGE-XR) 24 hr tablet 500 mg  500 mg Oral Q breakfast Kortnee Bas, MD   500 mg at 10/06/22 0746   polyethylene glycol (MIRALAX / GLYCOLAX) packet 17 g  17 g Oral Daily Noam Franzen, Harrold Donath, MD   17 g at 10/06/22 0745   sertraline (ZOLOFT) tablet 100 mg  100 mg Oral Daily Gino Garrabrant, Harrold Donath, MD   100 mg at 10/06/22 0746   traZODone (DESYREL) tablet 50 mg  50 mg Oral QHS PRN Karie Fetch, MD       vitamin D3 (CHOLECALCIFEROL) tablet 1,000 Units  1,000 Units Oral Daily Phineas Inches, MD   1,000 Units at 10/06/22 0746    Lab Results: No results found for this or any previous visit (from the past 48  hour(s)).  Blood Alcohol level:  Lab Results  Component Value Date   ETH <10 10/16/2021   ETH <10 04/24/2021    Metabolic Disorder Labs: Lab Results  Component Value Date   HGBA1C 5.2 05/18/2022   MPG 102.54 05/18/2022   MPG 120 04/29/2021   Lab Results  Component Value Date   PROLACTIN 18.5 06/18/2022   PROLACTIN 58.3 (H) 06/06/2022   Lab Results  Component Value Date   CHOL 217 (H) 05/18/2022   TRIG 31 05/18/2022   HDL 71 05/18/2022   CHOLHDL 3.1 05/18/2022   VLDL 6 05/18/2022   LDLCALC 140 (H) 05/18/2022   LDLCALC 94 05/02/2021    Physical Findings: AIMS: Facial and Oral Movements Muscles of Facial Expression: None, normal Lips and Perioral  Area: None, normal Jaw: None, normal Tongue: None, normal,Extremity Movements Upper (arms, wrists, hands, fingers): None, normal Lower (legs, knees, ankles, toes): None, normal, Trunk Movements Neck, shoulders, hips: None, normal, Overall Severity Severity of abnormal movements (highest score from questions above): None, normal Incapacitation due to abnormal movements: None, normal Patient's awareness of abnormal movements (rate only patient's report): No Awareness, Dental Status Current problems with teeth and/or dentures?: No Does patient usually wear dentures?: No  CIWA:    COWS:     Musculoskeletal: Strength & Muscle Tone: within normal limits Gait & Station: normal Patient leans: N/A  Psychiatric Specialty Exam:  Presentation  General Appearance:  Casual  Eye Contact: Poor  Speech: Slow  Speech Volume: Decreased  Handedness: Right   Mood and Affect  Mood: Depressed; Anxious  Affect: Flat   Thought Process  Thought Processes: Linear  Descriptions of Associations:Intact  Orientation:Full (Time, Place and Person)  Thought Content:Paranoid Ideation  History of Schizophrenia/Schizoaffective disorder:Yes  Duration of Psychotic Symptoms:Greater than six  months  Hallucinations:Hallucinations: None  Ideas of Reference:Paranoia; Delusions  Suicidal Thoughts:Suicidal Thoughts: No  Homicidal Thoughts:Homicidal Thoughts: No   Sensorium  Memory: Immediate Good; Recent Good; Remote Good  Judgment: Fair  Insight: Fair   Chartered certified accountant: Fair  Attention Span: Fair  Recall: [employer] of Knowledge: Fair  Language: Fair   Psychomotor Activity  Psychomotor Activity: Psychomotor Activity: Normal   Assets  Assets: Physical Health; Resilience   Sleep  Sleep: Sleep: Fair    Physical Exam: Physical Exam Vitals reviewed.    Review of Systems  Psychiatric/Behavioral:  Positive for depression. Negative for hallucinations, substance abuse and suicidal ideas. The patient is nervous/anxious. The patient does not have insomnia.    Blood pressure 112/60, pulse (!) 109, temperature 98.7 F (37.1 C), temperature source Oral, resp. rate 16, SpO2 100 %. There is no height or weight on file to calculate BMI.   Treatment Plan Summary: Daily contact with patient to assess and evaluate symptoms and progress in treatment  Assessment: Diagnoses / Active Problems:   Schizoaffective disorder (HCC)   GAD (generalized anxiety disorder)   Vitamin D deficiency   Sialorrhea  Plan  Safety and Monitoring: -- Voluntary admission to inpatient psychiatric unit for safety, stabilization and treatment -- Daily contact with patient to assess and evaluate symptoms and progress in treatment -- Patient's case to be discussed in multi-disciplinary team meeting -- Observation Level : q15 minute checks -- Vital signs:  q12 hours -- Precautions: suicide, elopement, and assault   2. Medications:  #Schizoaffective disorder, bipolar type -- Increase clozapine from 225 mg to 250 mg qhs - for psychosis.  Cbc-d and clozapine level ordered for 11-7 (not drawn) reordered for 11-8. Concern that paranoia is worse due to  lowered clozapine dose but also concern for cheeking (pt has done this multiple times in the past).  -- Continue atropine drops 1% 1 drop tid for sialorrhea.  -- Previously discontinued modafinil 100mg  qam - due to worsening psychosis -- Continue Zoloft 100 mg in the morning for mdd and gad    #HTN -- Continue Norvasc 10 mg daily for hypertension   #Tachycardia - Continue atenolol 12.5 mg daily - for tachycardia 2/2 clozapine  - Continue to monitor, low threshold to obtain EKG    #Constipation -Continue Colace 100 mg 2 twice daily for constipation -Continue Miralax daily for constipation   #Nutritional needs  -- Continue Ensure TID for nutritional support   #Iron deficiency  --  Continue Ferrous Sulfate 325 mg daily for iron replacement   #Weight gain Ppx -- Continue Metformin 500 mg XL for weight gain prophylaxis   #Anxiety -- Continue Atarax 25 mg TID PRN for anxiety   No tobacco use -- Patient does not need nicotine replacement   The patient is agreeable with the medication plan, as above. We will monitor the patient's response to pharmacologic treatment, and adjust medications as necessary.    Christoper Allegra, MD 10/06/2022, 2:29 PM  Total Time Spent in Direct Patient Care:  I personally spent 25 minutes on the unit in direct patient care. The direct patient care time included face-to-face time with the patient, reviewing the patient's chart, communicating with other professionals, and coordinating care. Greater than 50% of this time was spent in counseling or coordinating care with the patient regarding goals of hospitalization, psycho-education, and discharge planning needs.   Janine Limbo, MD Psychiatrist

## 2022-10-06 NOTE — Progress Notes (Addendum)
   10/06/22 1935  Psych Admission Type (Psych Patients Only)  Admission Status Voluntary  Psychosocial Assessment  Patient Complaints Anxiety  Eye Contact Brief  Facial Expression Flat  Affect Appropriate to circumstance;Sad  Speech Logical/coherent;Soft  Interaction Childlike  Motor Activity Slow  Appearance/Hygiene Unremarkable  Behavior Characteristics Cooperative  Mood Pleasant  Thought Process  Coherency WDL  Content WDL  Delusions None reported or observed  Perception WDL  Hallucination None reported or observed  Judgment Poor  Confusion WDL  Danger to Self  Current suicidal ideation? Denies  Agreement Not to Harm Self Yes  Description of Agreement Verbally contracts for safety.  Danger to Others  Danger to Others None reported or observed   Patient alert and oriented. Presenting appropriate to circumstance with sadness. Patient denies SI, HI, AVH, and pain. Patient states they "have a little bit of anxiety and sadness". Patient reports showering and having a good appetite. Scheduled clozaril administered per MAR. Patient interacted in dayroom on unit. Patient receptive, calm, and cooperative. Safety checks conducted every 15 minutes. Patient contracts for safety and remains safe on the unit.

## 2022-10-07 ENCOUNTER — Encounter (HOSPITAL_COMMUNITY): Payer: Self-pay

## 2022-10-07 LAB — COMPREHENSIVE METABOLIC PANEL
ALT: 13 U/L (ref 0–44)
AST: 16 U/L (ref 15–41)
Albumin: 3.8 g/dL (ref 3.5–5.0)
Alkaline Phosphatase: 65 U/L (ref 38–126)
Anion gap: 7 (ref 5–15)
BUN: 12 mg/dL (ref 6–20)
CO2: 26 mmol/L (ref 22–32)
Calcium: 8.9 mg/dL (ref 8.9–10.3)
Chloride: 109 mmol/L (ref 98–111)
Creatinine, Ser: 0.7 mg/dL (ref 0.44–1.00)
GFR, Estimated: >60 mL/min (ref >60)
Glucose, Bld: 89 mg/dL (ref 70–99)
Potassium: 3.5 mmol/L (ref 3.5–5.1)
Sodium: 142 mmol/L (ref 135–145)
Total Bilirubin: 0.8 mg/dL (ref 0.3–1.2)
Total Protein: 7.3 g/dL (ref 6.5–8.1)

## 2022-10-07 LAB — CBC WITH DIFFERENTIAL/PLATELET
Abs Immature Granulocytes: 0.02 10*3/uL (ref 0.00–0.07)
Basophils Absolute: 0 10*3/uL (ref 0.0–0.1)
Basophils Relative: 1 %
Eosinophils Absolute: 0.1 10*3/uL (ref 0.0–0.5)
Eosinophils Relative: 3 %
HCT: 40.3 % (ref 36.0–46.0)
Hemoglobin: 12.5 g/dL (ref 12.0–15.0)
Immature Granulocytes: 1 %
Lymphocytes Relative: 35 %
Lymphs Abs: 1.3 10*3/uL (ref 0.7–4.0)
MCH: 25.8 pg — ABNORMAL LOW (ref 26.0–34.0)
MCHC: 31 g/dL (ref 30.0–36.0)
MCV: 83.3 fL (ref 80.0–100.0)
Monocytes Absolute: 0.3 10*3/uL (ref 0.1–1.0)
Monocytes Relative: 9 %
Neutro Abs: 2 10*3/uL (ref 1.7–7.7)
Neutrophils Relative %: 51 %
Platelets: 246 10*3/uL (ref 150–400)
RBC: 4.84 MIL/uL (ref 3.87–5.11)
RDW: 14.3 % (ref 11.5–15.5)
WBC: 3.9 10*3/uL — ABNORMAL LOW (ref 4.0–10.5)
nRBC: 0 % (ref 0.0–0.2)

## 2022-10-07 MED ORDER — SERTRALINE HCL 25 MG PO TABS
125.0000 mg | ORAL_TABLET | Freq: Every day | ORAL | Status: DC
  Administered 2022-10-08 – 2022-10-12 (×5): 125 mg via ORAL
  Filled 2022-10-07 (×7): qty 1

## 2022-10-07 NOTE — Group Note (Signed)
Recreation Therapy Group Note   Group Topic:Team Building  Group Date: 10/07/2022 Start Time: 1000 End Time: 1025 Facilitators: Faten Frieson-McCall, LRT,CTRS Location: [address] Dayroom   Goal Area(s) Addresses:  Patient will effectively work with peer towards shared goal.  Patient will identify skills used to make activity successful.  Patient will identify how skills used during activity can be used to reach post d/c goals.   Group Description: Landing Pad. In teams of 3-5, patients were given 12 plastic drinking straws and an equal length of masking tape. Using the materials provided, patients were asked to build a landing pad to catch a golf ball dropped from approximately 5 feet in the air. All materials were required to be used by the team in their design. LRT facilitated post-activity discussion.   Affect/Mood: Flat   Participation Level: Engaged   Participation Quality: Independent   Behavior: Appropriate   Speech/Thought Process: Focused   Insight: Fair   Judgement: Fair    Modes of Intervention: STEM Activity   Patient Response to Interventions:  Engaged   Education Outcome:  Acknowledges education and In group clarification offered    Clinical Observations/Individualized Feedback: Pt was quiet but worked well with peer in completing activity.  Pt would demonstrate how things should look as a opposed to saying them.      Plan: Continue to engage patient in RT group sessions 2-3x/week.   Helina Hullum-McCall, LRT,CTRS 10/07/2022 11:33 AM

## 2022-10-07 NOTE — Group Note (Signed)
Type of Therapy and Topic: Group Therapy: Gratitude  Participation: Active  Description of Group: The purpose behind this group is to get people thinking about things for which  they can be grateful. If continued over time, they might begin to spontaneously look for things and  situations for which to be grateful. Gratitude is related to a "wide variety of forms of wellbeing , whereas "negative attributions" can adversely affect relationships.  Several studies have shown that interventions to  increase gratitude can impact areas such as overall life satisfaction, decreased negative affect, increased happiness, the ability to provide emotional support to others, and decreased worrying.   Therapeutic Goals: Patient will learn activities that focus on gratitude in their daily lives. Patient will share gratitude in their daily lives. Patient will learn to develop healthy habits and positive thinking techniques. Patient will receive support and feedback from others  Therapeutic Modalities: Cognitive Behavioral Therapy Solution Focused Therapy Motivational Interviewing   Kamrin Spath, LCSW, LCAS Clincal Social Worker  [facility]   

## 2022-10-07 NOTE — BH IP Treatment Plan (Signed)
Interdisciplinary Treatment and Diagnostic Plan Update  10/07/2022 Time of Session: 0830  Cheryl Adams MRN: [id number]  Principal Diagnosis: Schizoaffective disorder [facility])  Secondary Diagnoses: Principal Problem:   Schizoaffective disorder (HCC) Active Problems:   GAD (generalized anxiety disorder)   Vitamin D deficiency   Sialorrhea   Current Medications:  Current Facility-Administered Medications  Medication Dose Route Frequency Provider Last Rate Last Admin   acetaminophen (TYLENOL) tablet 650 mg  650 mg Oral Q6H PRN Massengill, Harrold Donath, MD       alum & mag hydroxide-simeth (MAALOX/MYLANTA) 200-200-20 MG/5ML suspension 30 mL  30 mL Oral Q4H PRN Massengill, Nathan, MD       amLODipine (NORVASC) tablet 10 mg  10 mg Oral Daily Massengill, Nathan, MD   10 mg at 10/07/22 9937   antiseptic oral rinse (BIOTENE) solution 15 mL  15 mL Mouth Rinse 5 X Daily PRN Massengill, Harrold Donath, MD       atenolol (TENORMIN) tablet 12.5 mg  12.5 mg Oral Daily Massengill, Nathan, MD   12.5 mg at 10/07/22 0811   atropine 1 % ophthalmic solution 1 drop  1 drop Sublingual TID Phineas Inches, MD   1 drop at 10/07/22 1622   cloZAPine (CLOZARIL) tablet 50 mg  50 mg Oral QHS Massengill, Harrold Donath, MD   50 mg at 10/07/22 2053   docusate sodium (COLACE) capsule 100 mg  100 mg Oral BID Massengill, Harrold Donath, MD   100 mg at 10/07/22 1622   ferrous sulfate tablet 325 mg  325 mg Oral Daily Massengill, Harrold Donath, MD   325 mg at 10/07/22 1696   magnesium hydroxide (MILK OF MAGNESIA) suspension 30 mL  30 mL Oral Daily PRN Massengill, Harrold Donath, MD       metFORMIN (GLUCOPHAGE-XR) 24 hr tablet 500 mg  500 mg Oral Q breakfast Massengill, Nathan, MD   500 mg at 10/07/22 0813   polyethylene glycol (MIRALAX / GLYCOLAX) packet 17 g  17 g Oral Daily Massengill, Harrold Donath, MD   17 g at 10/07/22 0812   [START ON 10/08/2022] sertraline (ZOLOFT) tablet 125 mg  125 mg Oral Daily Massengill, Nathan, MD       traZODone (DESYREL) tablet 50 mg  50 mg  Oral QHS PRN Karie Fetch, MD       tuberculin injection 5 Units  5 Units Intradermal Once Phineas Inches, MD   5 Units at 10/06/22 1603   vitamin D3 (CHOLECALCIFEROL) tablet 1,000 Units  1,000 Units Oral Daily Massengill, Harrold Donath, MD   1,000 Units at 10/07/22 7893   PTA Medications: Medications Prior to Admission  Medication Sig Dispense Refill Last Dose   amLODipine (NORVASC) 10 MG tablet Take 1 tablet (10 mg total) by mouth daily.      antiseptic oral rinse (BIOTENE) LIQD 15 mLs by Mouth Rinse route 5 (five) times daily as needed for dry mouth.      atenolol (TENORMIN) 25 MG tablet Take 0.5 tablets (12.5 mg total) by mouth daily.      atropine 1 % ophthalmic solution Place 1 drop under the tongue 3 (three) times daily. 2 mL 12    cloZAPine (CLOZARIL) 200 MG tablet Take 1 tablet (200 mg total) by mouth at bedtime.      docusate sodium (COLACE) 100 MG capsule Take 1 capsule (100 mg total) by mouth 2 (two) times daily. 10 capsule 0    ferrous sulfate 325 (65 FE) MG tablet Take 325 mg by mouth daily.      metFORMIN (GLUCOPHAGE-XR) 500 MG  24 hr tablet Take 1 tablet (500 mg total) by mouth daily with breakfast.      methylphenidate (RITALIN) 10 MG tablet Take 1 tablet (10 mg total) by mouth daily.  0    polyethylene glycol (MIRALAX / GLYCOLAX) 17 g packet Take 17 g by mouth daily. 14 each 0    sertraline (ZOLOFT) 100 MG tablet Take 1 tablet (100 mg total) by mouth daily.      vitamin D3 (CHOLECALCIFEROL) 25 MCG tablet Take 1 tablet (1,000 Units total) by mouth daily. 60 tablet      Patient Stressors:    Patient Strengths:    Treatment Modalities: Medication Management, Group therapy, Case management,  1 to 1 session with clinician, Psychoeducation, Recreational therapy.   Physician Treatment Plan for Primary Diagnosis: Schizoaffective disorder ([place]) Long Term Goal(s): Improvement in symptoms so as ready for discharge   Short Term Goals: Ability to identify changes in lifestyle to  reduce recurrence of condition will improve Ability to verbalize feelings will improve Ability to identify and develop effective coping behaviors will improve Ability to identify triggers associated with substance abuse/mental health issues will improve  Medication Management: Evaluate patient's response, side effects, and tolerance of medication regimen.  Therapeutic Interventions: 1 to 1 sessions, Unit Group sessions and Medication administration.  Evaluation of Outcomes: Progressing  Physician Treatment Plan for Secondary Diagnosis: Principal Problem:   Schizoaffective disorder ([place]) Active Problems:   GAD (generalized anxiety disorder)   Vitamin D deficiency   Sialorrhea  Long Term Goal(s): Improvement in symptoms so as ready for discharge   Short Term Goals: Ability to identify changes in lifestyle to reduce recurrence of condition will improve Ability to verbalize feelings will improve Ability to identify and develop effective coping behaviors will improve Ability to identify triggers associated with substance abuse/mental health issues will improve     Medication Management: Evaluate patient's response, side effects, and tolerance of medication regimen.  Therapeutic Interventions: 1 to 1 sessions, Unit Group sessions and Medication administration.  Evaluation of Outcomes: Progressing   RN Treatment Plan for Primary Diagnosis: Schizoaffective disorder ([place]) Long Term Goal(s): Knowledge of disease and therapeutic regimen to maintain health will improve  Short Term Goals: Ability to remain free from injury will improve, Ability to verbalize frustration and anger appropriately will improve, Ability to demonstrate self-control, Ability to participate in decision making will improve, Ability to verbalize feelings will improve, Ability to disclose and discuss suicidal ideas, Ability to identify and develop effective coping behaviors will improve, and Compliance with prescribed  medications will improve  Medication Management: RN will administer medications as ordered by provider, will assess and evaluate patient's response and provide education to patient for prescribed medication. RN will report any adverse and/or side effects to prescribing provider.  Therapeutic Interventions: 1 on 1 counseling sessions, Psychoeducation, Medication administration, Evaluate responses to treatment, Monitor vital signs and CBGs as ordered, Perform/monitor CIWA, COWS, AIMS and Fall Risk screenings as ordered, Perform wound care treatments as ordered.  Evaluation of Outcomes: Progressing   LCSW Treatment Plan for Primary Diagnosis: Schizoaffective disorder ([place]) Long Term Goal(s): Safe transition to appropriate next level of care at discharge, Engage patient in therapeutic group addressing interpersonal concerns.  Short Term Goals: Engage patient in aftercare planning with referrals and resources, Increase social support, Increase ability to appropriately verbalize feelings, Increase emotional regulation, Facilitate acceptance of mental health diagnosis and concerns, Facilitate patient progression through stages of change regarding substance use diagnoses and concerns, Identify triggers associated with mental  health/substance abuse issues, and Increase skills for wellness and recovery  Therapeutic Interventions: Assess for all discharge needs, 1 to 1 time with Social worker, Explore available resources and support systems, Assess for adequacy in community support network, Educate family and significant other(s) on suicide prevention, Complete Psychosocial Assessment, Interpersonal group therapy.  Evaluation of Outcomes: Progressing   Progress in Treatment: Attending groups: Yes. Participating in groups: No. Taking medication as prescribed: Yes. Toleration medication: Yes. Family/Significant other contact made: Yes, individual(s) contacted:  Tobie Lords, mother  Patient understands  diagnosis: No. Discussing patient identified problems/goals with staff: Yes. Medical problems stabilized or resolved: Yes. Denies suicidal/homicidal ideation: Yes. Issues/concerns per patient self-inventory: Yes. Other: none  New problem(s) identified: No, Describe:  none  New Short Term/Long Term Goal(s): Patient to work towards elimination of symptoms of psychosis, medication management for mood stabilization, development of comprehensive mental wellness plan.  Patient Goals:  No additional goals identified at this time. Patient to continue to work towards original goals identified in initial treatment team meeting. CSW will remain available to patient should they voice additional treatment goals.   Discharge Plan or Barriers: Patient lacks adequate housing and supervision at discharge.   Reason for Continuation of Hospitalization: Patient is boarding   Estimated Length of Stay:TBD  Last [place] Suicide Severity Risk Score: Flowsheet Row Admission (Discharged) from 05/19/2022 in [facility] ED from 05/18/2022 in [facility] Office Visit from 04/14/2022 in [place] No Risk High Risk No Risk       Last PHQ 2/9 Scores:    04/14/2022   10:46 AM 02/24/2022   11:12 AM 01/13/2022    9:14 AM  Depression screen PHQ 2/9  Decreased Interest 0 0 0  Down, Depressed, Hopeless 0 0 0  PHQ - 2 Score 0 0 0    Scribe for Treatment Team: Larose Kells 10/07/2022 11:19 PM

## 2022-10-07 NOTE — Progress Notes (Signed)
Adult Psychoeducational Group Note  Date:  10/07/2022 Time:  8:44 PM  Group Topic/Focus:  Wrap-Up Group:   The focus of this group is to help patients review their daily goal of treatment and discuss progress on daily workbooks.  Participation Level:  Active  Participation Quality:  Appropriate  Affect:  Appropriate  Cognitive:  Appropriate  Insight: Appropriate  Engagement in Group:  Engaged  Modes of Intervention:  Discussion, Education, and Support  Additional Comments:    Pt attended and participated in the Wrap Up group. Pt rated her day a 4/10. "I been depression and have anxiety". Pt shared that she was struggling with negative thoughts in her "head". Pt receptive to the encouragement of writing her thoughts, practicing identifying and replacing negative thoughts with positive thoughts. Pt reports using daily walking, exercising and meditation to improve her mood and coping.  Cheryl Adams 10/07/2022, 8:44 PM

## 2022-10-07 NOTE — Progress Notes (Signed)
[facility] MD Progress Note  10/07/2022 4:56 PM Cheryl Adams  MRN:  [id number]  Subjective:    Cheryl Adams is a 31 year old African-American female with prior psychiatric diagnoses of schizoaffective d/o depressive type and GAD who initially presented to the [facility] health urgent care [facility]) accompanied by her mother with complaints of paranoia. She was previously admitted to [facility] from 6/20-10/10/2022 and treated for schizoaffective disorder, depressive type with multiple medication trials (geodon, haldol, caplyta, thorazine, zyprexa, risperdal) and ultimately had partial response to clozapine. DSS was granted legal gaurdianship. On 09/10/2022, pt was d/c for competency evaluation per court. On 09/10/22, patient was readmitted to the psych unit after the evaluation.  Pt is slightly less paranoid and less anxious today. Reports still feeling depressed attributed to departing the unit soon (dc to group home next week?). Reports feeling that others could harm her on the unit, off and on, less frequent and less intense compared to yesterday.  Not tearful during interview.  Reports sleep is okay and appetite is okay.  Denying SI and HI. Denying AH, VH.   We discussed her lab work, low wbcs and decreased anc (2000 which is decreased but not at 1500 or less). Will monitor with repeat cbc-d and symptom monitoring for now.     Targeted review of symptoms, specific for clozapine: Malaise/Sedation: has some daytime fatigue Chest pain: denies Shortness of breath: denies Exertional capacity: denies Tachycardia: noted on vitals  Cough: denies Sore Throat: denies Fever: denies Orthostatic hypotension (dizziness with standing): denies Hypersalivation: at low level Constipation: denies  Nausea: denies Nocturnal enuresis: denies    Principal Problem: Schizoaffective disorder (HCC) Diagnosis: Principal Problem:   Schizoaffective disorder (HCC) Active Problems:   GAD (generalized anxiety  disorder)   Vitamin D deficiency   Sialorrhea  Total Time spent with patient: 20 minutes  Past Psychiatric History:  MDD, GAD, ultimately diagnosed with schizoaffective disorder depressive type.    Past Medical History:  Past Medical History:  Diagnosis Date   Anemia    Chronic tonsillitis 10/2014   Cough 11/09/2014   Difficulty swallowing pills    No psychiatric disorder found after evaluation 04/14/2022    Past Surgical History:  Procedure Laterality Date   TONSILLECTOMY AND ADENOIDECTOMY Bilateral 11/14/2014   Procedure: BILATERAL TONSILLECTOMY AND ADENOIDECTOMY;  Surgeon: Flo Shanks, MD;  Location: [facility];  Service: ENT;  Laterality: Bilateral;   TYMPANOSTOMY TUBE PLACEMENT     Family History: History reviewed. No pertinent family history.  Family Psychiatric  History:  Schizophrenia- Maternal grandfather.    Social History:  Social History   Substance and Sexual Activity  Alcohol Use No     Social History   Substance and Sexual Activity  Drug Use No    Social History   Socioeconomic History   Marital status: Single    Spouse name: Not on file   Number of children: Not on file   Years of education: Not on file   Highest education level: Not on file  Occupational History   Not on file  Tobacco Use   Smoking status: Never   Smokeless tobacco: Never  Substance and Sexual Activity   Alcohol use: No   Drug use: No   Sexual activity: Not on file  Other Topics Concern   Not on file  Social History Narrative   Not on file   Social Determinants of Health   Financial Resource Strain: Not on file  Food Insecurity: Unknown (09/10/2022)   Hunger  Vital Sign    Worried About Programme researcher, broadcasting/film/videounning Out of Food in the Last Year: Patient refused    Ran Out of Food in the Last Year: Patient refused  Transportation Needs: Unknown (09/10/2022)   PRAPARE - Administrator, Civil ServiceTransportation    Lack of Transportation (Medical): Patient refused    Lack of Transportation  (Non-Medical): Patient refused  Physical Activity: Not on file  Stress: Not on file  Social Connections: Not on file   Additional Social History:                         Sleep: Fair  Appetite:  Fair  Current Medications: Current Facility-Administered Medications  Medication Dose Route Frequency Provider Last Rate Last Admin   acetaminophen (TYLENOL) tablet 650 mg  650 mg Oral Q6H PRN Luisangel Wainright, Harrold DonathNathan, MD       alum & mag hydroxide-simeth (MAALOX/MYLANTA) 200-200-20 MG/5ML suspension 30 mL  30 mL Oral Q4H PRN Kymberley Raz, MD       amLODipine (NORVASC) tablet 10 mg  10 mg Oral Daily Mellie Buccellato, MD   10 mg at 10/07/22 [id number]   antiseptic oral rinse (BIOTENE) solution 15 mL  15 mL Mouth Rinse 5 X Daily PRN Briunna Leicht, Harrold DonathNathan, MD       atenolol (TENORMIN) tablet 12.5 mg  12.5 mg Oral Daily Damaya Channing, MD   12.5 mg at 10/07/22 0811   atropine 1 % ophthalmic solution 1 drop  1 drop Sublingual TID Phineas InchesMassengill, Tiburcio Linder, MD   1 drop at 10/07/22 1622   cloZAPine (CLOZARIL) tablet 50 mg  50 mg Oral QHS Dorcus Riga, Harrold DonathNathan, MD   50 mg at 10/06/22 2104   docusate sodium (COLACE) capsule 100 mg  100 mg Oral BID Emileo Semel, Harrold DonathNathan, MD   100 mg at 10/07/22 1622   ferrous sulfate tablet 325 mg  325 mg Oral Daily Raylyn Carton, Harrold DonathNathan, MD   325 mg at 10/07/22 [id number]   magnesium hydroxide (MILK OF MAGNESIA) suspension 30 mL  30 mL Oral Daily PRN Laurice Iglesia, Harrold DonathNathan, MD       metFORMIN (GLUCOPHAGE-XR) 24 hr tablet 500 mg  500 mg Oral Q breakfast Anastasiya Gowin, MD   500 mg at 10/07/22 0813   polyethylene glycol (MIRALAX / GLYCOLAX) packet 17 g  17 g Oral Daily Enzo Treu, Harrold DonathNathan, MD   17 g at 10/07/22 0812   [START ON 10/08/2022] sertraline (ZOLOFT) tablet 125 mg  125 mg Oral Daily Angelisse Riso, MD       traZODone (DESYREL) tablet 50 mg  50 mg Oral QHS PRN Karie Fetchhien, Stephanie, MD       tuberculin injection 5 Units  5 Units Intradermal Once Phineas InchesMassengill, Johnpaul Gillentine, MD   5 Units at  10/06/22 1603   vitamin D3 (CHOLECALCIFEROL) tablet 1,000 Units  1,000 Units Oral Daily Maliaka Brasington, Harrold DonathNathan, MD   1,000 Units at 10/07/22 [id number]    Lab Results:  Results for orders placed or performed during the hospital encounter of 09/10/22 (from the past 48 hour(s))  SARS Coronavirus 2 by RT PCR (hospital order, performed in [facility] hospital lab) *cepheid single result test* Anterior Nasal Swab     Status: None   Collection Time: 10/06/22  4:08 PM   Specimen: Anterior Nasal Swab  Result Value Ref Range   SARS Coronavirus 2 by RT PCR NEGATIVE NEGATIVE    Comment: (NOTE) SARS-CoV-2 target nucleic acids are NOT DETECTED.  The SARS-CoV-2 RNA is generally detectable in upper and lower respiratory specimens during the acute  phase of infection. The lowest concentration of SARS-CoV-2 viral copies this assay can detect is 250 copies / mL. A negative result does not preclude SARS-CoV-2 infection and should not be used as the sole basis for treatment or other patient management decisions.  A negative result may occur with improper specimen collection / handling, submission of specimen other than nasopharyngeal swab, presence of viral mutation(s) within the areas targeted by this assay, and inadequate number of viral copies (<250 copies / mL). A negative result must be combined with clinical observations, patient history, and epidemiological information.  Fact Sheet for Patients:   RoadLapTop.co.za  Fact Sheet for Healthcare Providers: http://kim-miller.com/  This test is not yet approved or  cleared by the [place] FDA and has been authorized for detection and/or diagnosis of SARS-CoV-2 by FDA under an Emergency Use Authorization (EUA).  This EUA will remain in effect (meaning this test can be used) for the duration of the COVID-19 declaration under Section 564(b)(1) of the Act, 21 U.S.C. section 360bbb-3(b)(1), unless the authorization  is terminated or revoked sooner.  Performed at [facility], [address], [place], [place] [address]   CBC with Differential/Platelet     Status: Abnormal   Collection Time: 10/07/22  6:38 AM  Result Value Ref Range   WBC 3.9 (L) 4.0 - 10.5 K/uL   RBC 4.84 3.87 - 5.11 MIL/uL   Hemoglobin 12.5 12.0 - 15.0 g/dL   HCT 35.4 65.6 - 81.2 %   MCV 83.3 80.0 - 100.0 fL   MCH 25.8 (L) 26.0 - 34.0 pg   MCHC 31.0 30.0 - 36.0 g/dL   RDW 75.1 70.0 - 17.4 %   Platelets 246 150 - 400 K/uL   nRBC 0.0 0.0 - 0.2 %   Neutrophils Relative % 51 %   Neutro Abs 2.0 1.7 - 7.7 K/uL   Lymphocytes Relative 35 %   Lymphs Abs 1.3 0.7 - 4.0 K/uL   Monocytes Relative 9 %   Monocytes Absolute 0.3 0.1 - 1.0 K/uL   Eosinophils Relative 3 %   Eosinophils Absolute 0.1 0.0 - 0.5 K/uL   Basophils Relative 1 %   Basophils Absolute 0.0 0.0 - 0.1 K/uL   Immature Granulocytes 1 %   Abs Immature Granulocytes 0.02 0.00 - 0.07 K/uL    Comment: Performed at [facility], [address], [place], [place] [address]  Comprehensive metabolic panel     Status: None   Collection Time: 10/07/22  6:38 AM  Result Value Ref Range   Sodium 142 135 - 145 mmol/L   Potassium 3.5 3.5 - 5.1 mmol/L   Chloride 109 98 - 111 mmol/L   CO2 26 22 - 32 mmol/L   Glucose, Bld 89 70 - 99 mg/dL    Comment: Glucose reference range applies only to samples taken after fasting for at least 8 hours.   BUN 12 6 - 20 mg/dL   Creatinine, Ser 7.59 0.44 - 1.00 mg/dL   Calcium 8.9 8.9 - 16.3 mg/dL   Total Protein 7.3 6.5 - 8.1 g/dL   Albumin 3.8 3.5 - 5.0 g/dL   AST 16 15 - 41 U/L   ALT 13 0 - 44 U/L   Alkaline Phosphatase 65 38 - 126 U/L   Total Bilirubin 0.8 0.3 - 1.2 mg/dL   GFR, Estimated >84 >66 mL/min    Comment: (NOTE) Calculated using the CKD-EPI Creatinine Equation (2021)    Anion gap 7 5 - 15  Comment: Performed at [facility], [address], [place], [place] [address]     Blood Alcohol level:  Lab Results  Component Value Date   [facility] <10 10/16/2021   ETH <10 04/24/2021    Metabolic Disorder Labs: Lab Results  Component Value Date   HGBA1C 5.2 05/18/2022   MPG 102.54 05/18/2022   MPG 120 04/29/2021   Lab Results  Component Value Date   PROLACTIN 18.5 06/18/2022   PROLACTIN 58.3 (H) 06/06/2022   Lab Results  Component Value Date   CHOL 217 (H) 05/18/2022   TRIG 31 05/18/2022   HDL 71 05/18/2022   CHOLHDL 3.1 05/18/2022   VLDL 6 05/18/2022   LDLCALC 140 (H) 05/18/2022   LDLCALC 94 05/02/2021    Physical Findings: AIMS: Facial and Oral Movements Muscles of Facial Expression: None, normal Lips and Perioral Area: None, normal Jaw: None, normal Tongue: None, normal,Extremity Movements Upper (arms, wrists, hands, fingers): None, normal Lower (legs, knees, ankles, toes): None, normal, Trunk Movements Neck, shoulders, hips: None, normal, Overall Severity Severity of abnormal movements (highest score from questions above): None, normal Incapacitation due to abnormal movements: None, normal Patient's awareness of abnormal movements (rate only patient's report): No Awareness, Dental Status Current problems with teeth and/or dentures?: No Does patient usually wear dentures?: No  CIWA:    COWS:     Musculoskeletal: Strength & Muscle Tone: within normal limits Gait & Station: normal Patient leans: N/A  Psychiatric Specialty Exam:  Presentation  General Appearance:  Casual  Eye Contact: Poor  Speech: Slow  Speech Volume: Decreased  Handedness: Right   Mood and Affect  Mood: Depressed; Anxious  Affect: Flat   Thought Process  Thought Processes: Linear  Descriptions of Associations:Intact  Orientation:Full (Time, Place and Person)  Thought Content:Paranoid Ideation  History of Schizophrenia/Schizoaffective disorder:Yes  Duration of Psychotic Symptoms:Greater than six months  Hallucinations:No data  recorded Denies ah, vh  Ideas of Reference:Paranoia; Delusions  Suicidal Thoughts:No data recorded Denies  Homicidal Thoughts:No data recorded Denies    Sensorium  Memory: Immediate Good; Recent Good; Remote Good  Judgment: Fair  Insight: Fair   Chartered certified accountant: Fair  Attention Span: Fair  Recall: [employer] of Knowledge: Fair  Language: Fair   Psychomotor Activity  Psychomotor Activity: No data recorded nml  Assets  Assets: Physical Health; Resilience   Sleep  Sleep: No data recorded nml   Physical Exam: Physical Exam Vitals reviewed.    Review of Systems  Psychiatric/Behavioral:  Positive for depression. Negative for hallucinations, substance abuse and suicidal ideas. The patient is nervous/anxious. The patient does not have insomnia.    Blood pressure 128/84, pulse 81, temperature 97.7 F (36.5 C), temperature source Oral, resp. rate 18, SpO2 100 %. There is no height or weight on file to calculate BMI.   Treatment Plan Summary: Daily contact with patient to assess and evaluate symptoms and progress in treatment  Assessment: Diagnoses / Active Problems:   Schizoaffective disorder (HCC)   GAD (generalized anxiety disorder)   Vitamin D deficiency   Sialorrhea  Plan  Safety and Monitoring: -- Voluntary admission to inpatient psychiatric unit for safety, stabilization and treatment -- Daily contact with patient to assess and evaluate symptoms and progress in treatment -- Patient's case to be discussed in multi-disciplinary team meeting -- Observation Level : q15 minute checks -- Vital signs:  q12 hours -- Precautions: suicide, elopement, and assault   2. Medications:  #Schizoaffective disorder, bipolar type -- Continue  clozapine 250 mg qhs - for psychosis. ANC 2000 today. Entered into rems. Will repeat cbc-d tomorrow, given low wbc and decreasing anc today.  -- Continue atropine drops 1% 1 drop tid for  sialorrhea.  -- Increase Zoloft to 125 mg in the morning for mdd and gad    #HTN -- Continue Norvasc 10 mg daily for hypertension   #Tachycardia - Continue atenolol 12.5 mg daily - for tachycardia 2/2 clozapine  - Continue to monitor, low threshold to obtain EKG    #Constipation -Continue Colace 100 mg 2 twice daily for constipation -Continue Miralax daily for constipation   #Nutritional needs  -- Continue Ensure TID for nutritional support   #Iron deficiency  -- Continue Ferrous Sulfate 325 mg daily for iron replacement   #Weight gain Ppx -- Continue Metformin 500 mg XL for weight gain prophylaxis   #Anxiety -- Continue Atarax 25 mg TID PRN for anxiety   No tobacco use -- Patient does not need nicotine replacement   The patient is agreeable with the medication plan, as above. We will monitor the patient's response to pharmacologic treatment, and adjust medications as necessary.    Cristy Hilts, MD 10/07/2022, 4:56 PM  Total Time Spent in Direct Patient Care:  I personally spent 35 minutes on the unit in direct patient care. The direct patient care time included face-to-face time with the patient, reviewing the patient's chart, communicating with other professionals, and coordinating care. Greater than 50% of this time was spent in counseling or coordinating care with the patient regarding goals of hospitalization, psycho-education, and discharge planning needs.   Phineas Inches, MD Psychiatrist

## 2022-10-07 NOTE — Progress Notes (Signed)
   10/07/22 0540  Sleep  Number of Hours 7

## 2022-10-08 LAB — CBC WITH DIFFERENTIAL/PLATELET
Abs Immature Granulocytes: 0.02 10*3/uL (ref 0.00–0.07)
Basophils Absolute: 0 10*3/uL (ref 0.0–0.1)
Basophils Relative: 1 %
Eosinophils Absolute: 0.1 10*3/uL (ref 0.0–0.5)
Eosinophils Relative: 3 %
HCT: 40.6 % (ref 36.0–46.0)
Hemoglobin: 12.3 g/dL (ref 12.0–15.0)
Immature Granulocytes: 1 %
Lymphocytes Relative: 31 %
Lymphs Abs: 1.2 10*3/uL (ref 0.7–4.0)
MCH: 25.4 pg — ABNORMAL LOW (ref 26.0–34.0)
MCHC: 30.3 g/dL (ref 30.0–36.0)
MCV: 83.9 fL (ref 80.0–100.0)
Monocytes Absolute: 0.3 10*3/uL (ref 0.1–1.0)
Monocytes Relative: 8 %
Neutro Abs: 2.2 10*3/uL (ref 1.7–7.7)
Neutrophils Relative %: 56 %
Platelets: 243 10*3/uL (ref 150–400)
RBC: 4.84 MIL/uL (ref 3.87–5.11)
RDW: 14.2 % (ref 11.5–15.5)
WBC: 3.9 10*3/uL — ABNORMAL LOW (ref 4.0–10.5)
nRBC: 0 % (ref 0.0–0.2)

## 2022-10-08 LAB — CLOZAPINE (CLOZARIL)
Clozapine Lvl: 133 ng/mL — ABNORMAL LOW (ref 350–600)
NorClozapine: 44 ng/mL
Total(Cloz+Norcloz): 177 ng/mL

## 2022-10-08 NOTE — Progress Notes (Signed)
Adult Psychoeducational Group Note  Date:  10/08/2022 Time:  8:23 PM  Group Topic/Focus:  Wrap-Up Group:   The focus of this group is to help patients review their daily goal of treatment and discuss progress on daily workbooks.  Participation Level:  Active  Participation Quality:  Appropriate  Affect:  Appropriate  Cognitive:  Appropriate  Insight: Appropriate  Engagement in Group:  Engaged  Modes of Intervention:  Discussion and Support  Additional Comments:    Pt attended and participated in the [place] group. Pt rated her day a 4/10. Pt was able to meet her goal of exercising. "I walked and danced today, I met my goal". Pt reported using music, dance, outside time today to improve her mood  and coping. Pt had no questions or concerns today.  [name] Shrita Thien 10/08/2022, 8:23 PM

## 2022-10-08 NOTE — Progress Notes (Signed)
   10/07/22 2055  Psych Admission Type (Psych Patients Only)  Admission Status Voluntary  Psychosocial Assessment  Patient Complaints None  Eye Contact Brief  Facial Expression Flat  Affect Flat  Speech Logical/coherent;Soft  Interaction Childlike  Motor Activity Slow  Appearance/Hygiene Improved  Behavior Characteristics Appropriate to situation  Mood Pleasant  Thought Process  Coherency WDL  Content WDL  Delusions None reported or observed  Perception WDL  Hallucination None reported or observed  Judgment Poor  Confusion None  Danger to Self  Current suicidal ideation? Denies  Agreement Not to Harm Self Yes  Description of Agreement Verbal Contract  Danger to Others  Danger to Others None reported or observed

## 2022-10-08 NOTE — Progress Notes (Signed)
PPD read. 81mm. Left forearm.

## 2022-10-08 NOTE — Progress Notes (Signed)
   10/08/22 0653  Sleep  Number of Hours 7.5

## 2022-10-08 NOTE — NC FL2 (Signed)
[place] MEDICAID FL2 LEVEL OF CARE SCREENING TOOL     IDENTIFICATION  Patient Name: Cheryl Adams Birthdate: 1991/06/29 Sex: female Admission Date (Current Location): 09/10/2022  [facility] and [place] Number:  Producer, television/film/video and Address:  [facility],  [address] 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   Vitamin D deficiency 05/24/2022   Schizoaffective disorder, depressive type (HCC) 05/20/2022   Aggressive behavior    Major depressive disorder, recurrent episode, moderate with anxious distress (HCC) 05/17/2021   GAD (generalized anxiety disorder) 05/17/2021   MDD (major depressive disorder), recurrent, with catatonic features (HCC) 04/28/2021   MDD (major depressive disorder), single episode with catatonic features 04/27/2021   Gastroesophageal reflux disease without esophagitis 02/03/2019   Pharyngoesophageal dysphagia 02/03/2019   Globus pharyngeus 07/17/2016   Laryngopharyngeal reflux (LPR) 07/17/2016   Odynophagia 07/07/2016   Iron deficiency anemia 02/26/2015   S/P tonsillectomy and adenoidectomy 02/26/2015   Healthcare maintenance 02/26/2015    Orientation RESPIRATION BLADDER Height & Weight     Self, Time, Situation, Place  Normal Continent Weight:   Height:     BEHAVIORAL SYMPTOMS/MOOD NEUROLOGICAL BOWEL NUTRITION STATUS      Continent    AMBULATORY STATUS  COMMUNICATION OF NEEDS Skin   Independent Verbally Normal                       Personal Care Assistance Level of Assistance  Bathing (Prompting) Bathing Assistance: Limited assistance         Functional Limitations Info  Sight (Wears glasses) Sight Info: Impaired (Wears glasses)        SPECIAL CARE FACTORS FREQUENCY  Blood pressure Blood Pressure Frequency: Takes maintaince medication                  Contractures Contractures Info: Not present    Additional Factors Info  Psychotropic     Psychotropic Info: Clozapine for Schizophrenia         Current Medications (10/08/2022):  This is the current hospital active medication list Current Facility-Administered Medications  Medication Dose Route Frequency Provider Last Rate Last Admin   acetaminophen (TYLENOL) tablet 650 mg  650 mg Oral Q6H PRN Massengill, Nathan, MD       alum & mag hydroxide-simeth (MAALOX/MYLANTA) 200-200-20 MG/5ML suspension 30 mL  30 mL Oral Q4H PRN Massengill, Nathan, MD       amLODipine (NORVASC) tablet 10 mg  10 mg Oral Daily Massengill, Nathan, MD   10 mg at 10/08/22 0750   antiseptic oral rinse (BIOTENE) solution 15 mL  15 mL Mouth Rinse 5 X Daily PRN Massengill, Harrold Donath, MD       atenolol (TENORMIN) tablet 12.5 mg  12.5 mg Oral Daily Massengill, Nathan, MD   12.5 mg at 10/08/22 0753   atropine 1 % ophthalmic solution 1 drop  1 drop Sublingual TID Phineas Inches, MD  1 drop at 10/08/22 0753   cloZAPine (CLOZARIL) tablet 50 mg  50 mg Oral QHS Massengill, Harrold Donath, MD   50 mg at 10/07/22 2053   docusate sodium (COLACE) capsule 100 mg  100 mg Oral BID Massengill, Harrold Donath, MD   100 mg at 10/08/22 0750   ferrous sulfate tablet 325 mg  325 mg Oral Daily Massengill, Harrold Donath, MD   325 mg at 10/08/22 4259   magnesium hydroxide (MILK OF MAGNESIA) suspension 30 mL  30 mL Oral Daily PRN Massengill, Harrold Donath, MD       metFORMIN (GLUCOPHAGE-XR) 24 hr tablet 500 mg  500 mg Oral Q breakfast Massengill,  Nathan, MD   500 mg at 10/08/22 0751   polyethylene glycol (MIRALAX / GLYCOLAX) packet 17 g  17 g Oral Daily Massengill, Harrold Donath, MD   17 g at 10/08/22 0749   sertraline (ZOLOFT) tablet 125 mg  125 mg Oral Daily Massengill, Harrold Donath, MD   125 mg at 10/08/22 0752   traZODone (DESYREL) tablet 50 mg  50 mg Oral QHS PRN Karie Fetch, MD       tuberculin injection 5 Units  5 Units Intradermal Once Phineas Inches, MD   5 Units at 10/06/22 1603   vitamin D3 (CHOLECALCIFEROL) tablet 1,000 Units  1,000 Units Oral Daily Phineas Inches, MD   1,000 Units at 10/08/22 0750     Discharge Medications: Please see discharge summary for a list of discharge medications.  Relevant Imaging Results:  Relevant Lab Results:   Additional Information    Aram Beecham, LCSWA

## 2022-10-08 NOTE — Progress Notes (Signed)
[facility] MD Progress Note  10/08/2022 2:56 PM Cheryl Adams  MRN:  [id number]  Subjective:    Cheryl Adams is a 31 year old African-American female with prior psychiatric diagnoses of schizoaffective d/o depressive type and GAD who initially presented to the [facility] urgent care [facility]) accompanied by her mother with complaints of paranoia. She was previously admitted to [facility] from 6/20-10/10/2022 and treated for schizoaffective disorder, depressive type with multiple medication trials (geodon, haldol, caplyta, thorazine, zyprexa, risperdal) and ultimately had partial response to clozapine. DSS was granted legal gaurdianship. On 09/10/2022, pt was d/c for competency evaluation per court. On 09/10/22, patient was readmitted to the psych unit after the evaluation.  Pt is slightly less paranoid and less anxious today. Reports still feeling depressed attributed to departing the unit soon (dc to group home next week?). Reports feeling that others could harm her on the unit, off and on, less frequent and less intense compared to days prior. Not tearful during interview, which is an improvement.  Reports sleep is okay and appetite is okay.  Denying SI and HI. Denying AH, VH.   We discussed that ANC is slightly up and will recheck in 2 days from now.     Targeted review of symptoms, specific for clozapine: Malaise/Sedation: has some daytime fatigue Chest pain: denies Shortness of breath: denies Exertional capacity: denies Tachycardia: noted on vitals  Cough: denies Sore Throat: denies Fever: denies Orthostatic hypotension (dizziness with standing): denies Hypersalivation: at low level Constipation: denies  Nausea: denies Nocturnal enuresis: denies    Principal Problem: Schizoaffective disorder (HCC) Diagnosis: Principal Problem:   Schizoaffective disorder (HCC) Active Problems:   GAD (generalized anxiety disorder)   Vitamin D deficiency   Sialorrhea  Total Time spent  with patient: 20 minutes  Past Psychiatric History:  MDD, GAD, ultimately diagnosed with schizoaffective disorder depressive type.    Past Medical History:  Past Medical History:  Diagnosis Date   Anemia    Chronic tonsillitis 10/2014   Cough 11/09/2014   Difficulty swallowing pills    No psychiatric disorder found after evaluation 04/14/2022    Past Surgical History:  Procedure Laterality Date   TONSILLECTOMY AND ADENOIDECTOMY Bilateral 11/14/2014   Procedure: BILATERAL TONSILLECTOMY AND ADENOIDECTOMY;  Surgeon: Flo Shanks, MD;  Location: [facility];  Service: ENT;  Laterality: Bilateral;   TYMPANOSTOMY TUBE PLACEMENT     Family History: History reviewed. No pertinent family history.  Family Psychiatric  History:  Schizophrenia- Maternal grandfather.    Social History:  Social History   Substance and Sexual Activity  Alcohol Use No     Social History   Substance and Sexual Activity  Drug Use No    Social History   Socioeconomic History   Marital status: Single    Spouse name: Not on file   Number of children: Not on file   Years of education: Not on file   Highest education level: Not on file  Occupational History   Not on file  Tobacco Use   Smoking status: Never   Smokeless tobacco: Never  Substance and Sexual Activity   Alcohol use: No   Drug use: No   Sexual activity: Not on file  Other Topics Concern   Not on file  Social History Narrative   Not on file   Social Determinants of Health   Financial Resource Strain: Not on file  Food Insecurity: Unknown (09/10/2022)   Hunger Vital Sign    Worried About Running Out of Food  in the Last Year: Patient refused    Ran Out of Food in the Last Year: Patient refused  Transportation Needs: Unknown (09/10/2022)   PRAPARE - Administrator, Civil Service (Medical): Patient refused    Lack of Transportation (Non-Medical): Patient refused  Physical Activity: Not on file  Stress:  Not on file  Social Connections: Not on file   Additional Social History:                         Sleep: Fair  Appetite:  Fair  Current Medications: Current Facility-Administered Medications  Medication Dose Route Frequency Provider Last Rate Last Admin   acetaminophen (TYLENOL) tablet 650 mg  650 mg Oral Q6H PRN Jerron Niblack, Harrold Donath, MD       alum & mag hydroxide-simeth (MAALOX/MYLANTA) 200-200-20 MG/5ML suspension 30 mL  30 mL Oral Q4H PRN Jensen Cheramie, MD       amLODipine (NORVASC) tablet 10 mg  10 mg Oral Daily Keefe Zawistowski, MD   10 mg at 10/08/22 0750   antiseptic oral rinse (BIOTENE) solution 15 mL  15 mL Mouth Rinse 5 X Daily PRN Zhyon Antenucci, Harrold Donath, MD       atenolol (TENORMIN) tablet 12.5 mg  12.5 mg Oral Daily Yuvin Bussiere, MD   12.5 mg at 10/08/22 0753   atropine 1 % ophthalmic solution 1 drop  1 drop Sublingual TID Phineas Inches, MD   1 drop at 10/08/22 0753   cloZAPine (CLOZARIL) tablet 50 mg  50 mg Oral QHS Christianjames Soule, Harrold Donath, MD   50 mg at 10/07/22 2053   docusate sodium (COLACE) capsule 100 mg  100 mg Oral BID Casson Catena, Harrold Donath, MD   100 mg at 10/08/22 0750   ferrous sulfate tablet 325 mg  325 mg Oral Daily Renesme Kerrigan, Harrold Donath, MD   325 mg at 10/08/22 3785   magnesium hydroxide (MILK OF MAGNESIA) suspension 30 mL  30 mL Oral Daily PRN Yomayra Tate, Harrold Donath, MD       metFORMIN (GLUCOPHAGE-XR) 24 hr tablet 500 mg  500 mg Oral Q breakfast Joette Schmoker, MD   500 mg at 10/08/22 0751   polyethylene glycol (MIRALAX / GLYCOLAX) packet 17 g  17 g Oral Daily Syaire Saber, Harrold Donath, MD   17 g at 10/08/22 0749   sertraline (ZOLOFT) tablet 125 mg  125 mg Oral Daily Carl Butner, Harrold Donath, MD   125 mg at 10/08/22 0752   traZODone (DESYREL) tablet 50 mg  50 mg Oral QHS PRN Karie Fetch, MD       tuberculin injection 5 Units  5 Units Intradermal Once Phineas Inches, MD   5 Units at 10/06/22 1603   vitamin D3 (CHOLECALCIFEROL) tablet 1,000 Units  1,000  Units Oral Daily Lee-Anne Flicker, Harrold Donath, MD   1,000 Units at 10/08/22 0750    Lab Results:  Results for orders placed or performed during the hospital encounter of 09/10/22 (from the past 48 hour(s))  SARS Coronavirus 2 by RT PCR (hospital order, performed in [facility] hospital lab) *cepheid single result test* Anterior Nasal Swab     Status: None   Collection Time: 10/06/22  4:08 PM   Specimen: Anterior Nasal Swab  Result Value Ref Range   SARS Coronavirus 2 by RT PCR NEGATIVE NEGATIVE    Comment: (NOTE) SARS-CoV-2 target nucleic acids are NOT DETECTED.  The SARS-CoV-2 RNA is generally detectable in upper and lower respiratory specimens during the acute phase of infection. The lowest concentration of SARS-CoV-2 viral copies this  assay can detect is 250 copies / mL. A negative result does not preclude SARS-CoV-2 infection and should not be used as the sole basis for treatment or other patient management decisions.  A negative result may occur with improper specimen collection / handling, submission of specimen other than nasopharyngeal swab, presence of viral mutation(s) within the areas targeted by this assay, and inadequate number of viral copies (<250 copies / mL). A negative result must be combined with clinical observations, patient history, and epidemiological information.  Fact Sheet for Patients:   RoadLapTop.co.zahttps://www.fda.gov/media/158405/download  Fact Sheet for Healthcare Providers: http://kim-miller.com/https://www.fda.gov/media/158404/download  This test is not yet approved or  cleared by the [place] FDA and has been authorized for detection and/or diagnosis of SARS-CoV-2 by FDA under an Emergency Use Authorization (EUA).  This EUA will remain in effect (meaning this test can be used) for the duration of the COVID-19 declaration under Section 564(b)(1) of the Act, 21 U.S.C. section 360bbb-3(b)(1), unless the authorization is terminated or revoked sooner.  Performed at [facility], [address] [place], [place] [address]   CBC with Differential/Platelet     Status: Abnormal   Collection Time: 10/07/22  6:38 AM  Result Value Ref Range   WBC 3.9 (L) 4.0 - 10.5 K/uL   RBC 4.84 3.87 - 5.11 MIL/uL   Hemoglobin 12.5 12.0 - 15.0 g/dL   HCT 19.140.3 47.836.0 - 29.546.0 %   MCV 83.3 80.0 - 100.0 fL   MCH 25.8 (L) 26.0 - 34.0 pg   MCHC 31.0 30.0 - 36.0 g/dL   RDW 62.114.3 30.811.5 - 65.715.5 %   Platelets 246 150 - 400 K/uL   nRBC 0.0 0.0 - 0.2 %   Neutrophils Relative % 51 %   Neutro Abs 2.0 1.7 - 7.7 K/uL   Lymphocytes Relative 35 %   Lymphs Abs 1.3 0.7 - 4.0 K/uL   Monocytes Relative 9 %   Monocytes Absolute 0.3 0.1 - 1.0 K/uL   Eosinophils Relative 3 %   Eosinophils Absolute 0.1 0.0 - 0.5 K/uL   Basophils Relative 1 %   Basophils Absolute 0.0 0.0 - 0.1 K/uL   Immature Granulocytes 1 %   Abs Immature Granulocytes 0.02 0.00 - 0.07 K/uL    Comment: Performed at [facility], [address] [place], [place] [address]  Comprehensive metabolic panel     Status: None   Collection Time: 10/07/22  6:38 AM  Result Value Ref Range   Sodium 142 135 - 145 mmol/L   Potassium 3.5 3.5 - 5.1 mmol/L   Chloride 109 98 - 111 mmol/L   CO2 26 22 - 32 mmol/L   Glucose, Bld 89 70 - 99 mg/dL    Comment: Glucose reference range applies only to samples taken after fasting for at least 8 hours.   BUN 12 6 - 20 mg/dL   Creatinine, Ser 2.950.70 0.44 - 1.00 mg/dL   Calcium 8.9 8.9 - 28.410.3 mg/dL   Total Protein 7.3 6.5 - 8.1 g/dL   Albumin 3.8 3.5 - 5.0 g/dL   AST 16 15 - 41 U/L   ALT 13 0 - 44 U/L   Alkaline Phosphatase 65 38 - 126 U/L   Total Bilirubin 0.8 0.3 - 1.2 mg/dL   GFR, Estimated >13>60 >24>60 mL/min    Comment: (NOTE) Calculated using the CKD-EPI Creatinine Equation (2021)    Anion gap 7 5 - 15    Comment: Performed at [facility], [address] [place], [place] [address]  CBC with Differential/Platelet     Status: Abnormal   Collection Time:  10/08/22  6:30 AM  Result Value Ref Range   WBC 3.9 (L) 4.0 - 10.5 K/uL   RBC 4.84 3.87 - 5.11 MIL/uL   Hemoglobin 12.3 12.0 - 15.0 g/dL   HCT 75.1 02.5 - 85.2 %   MCV 83.9 80.0 - 100.0 fL   MCH 25.4 (L) 26.0 - 34.0 pg   MCHC 30.3 30.0 - 36.0 g/dL   RDW 77.8 24.2 - 35.3 %   Platelets 243 150 - 400 K/uL   nRBC 0.0 0.0 - 0.2 %   Neutrophils Relative % 56 %   Neutro Abs 2.2 1.7 - 7.7 K/uL   Lymphocytes Relative 31 %   Lymphs Abs 1.2 0.7 - 4.0 K/uL   Monocytes Relative 8 %   Monocytes Absolute 0.3 0.1 - 1.0 K/uL   Eosinophils Relative 3 %   Eosinophils Absolute 0.1 0.0 - 0.5 K/uL   Basophils Relative 1 %   Basophils Absolute 0.0 0.0 - 0.1 K/uL   Immature Granulocytes 1 %   Abs Immature Granulocytes 0.02 0.00 - 0.07 K/uL    Comment: Performed at [facility], [address], [place], [place] [address]    Blood Alcohol level:  Lab Results  Component Value Date   [facility] <10 10/16/2021   ETH <10 04/24/2021    Metabolic Disorder Labs: Lab Results  Component Value Date   HGBA1C 5.2 05/18/2022   MPG 102.54 05/18/2022   MPG 120 04/29/2021   Lab Results  Component Value Date   PROLACTIN 18.5 06/18/2022   PROLACTIN 58.3 (H) 06/06/2022   Lab Results  Component Value Date   CHOL 217 (H) 05/18/2022   TRIG 31 05/18/2022   HDL 71 05/18/2022   CHOLHDL 3.1 05/18/2022   VLDL 6 05/18/2022   LDLCALC 140 (H) 05/18/2022   LDLCALC 94 05/02/2021    Physical Findings: AIMS: Facial and Oral Movements Muscles of Facial Expression: None, normal Lips and Perioral Area: None, normal Jaw: None, normal Tongue: None, normal,Extremity Movements Upper (arms, wrists, hands, fingers): None, normal Lower (legs, knees, ankles, toes): None, normal, Trunk Movements Neck, shoulders, hips: None, normal, Overall Severity Severity of abnormal movements (highest score from questions above): None, normal Incapacitation due to abnormal movements: None, normal Patient's awareness of  abnormal movements (rate only patient's report): No Awareness, Dental Status Current problems with teeth and/or dentures?: No Does patient usually wear dentures?: No  CIWA:    COWS:     Musculoskeletal: Strength & Muscle Tone: within normal limits Gait & Station: normal Patient leans: N/A  Psychiatric Specialty Exam:  Presentation  General Appearance:  Casual  Eye Contact: Poor  Speech: Slow  Speech Volume: Decreased  Handedness: Right   Mood and Affect  Mood: Depressed; Anxious  Affect: Flat   Thought Process  Thought Processes: Linear  Descriptions of Associations:Intact  Orientation:Full (Time, Place and Person)  Thought Content:Paranoid Ideation  History of Schizophrenia/Schizoaffective disorder:Yes  Duration of Psychotic Symptoms:Greater than six months  Hallucinations:No data recorded Denies ah, vh  Ideas of Reference:Paranoia; Delusions  Suicidal Thoughts:No data recorded Denies  Homicidal Thoughts:No data recorded Denies    Sensorium  Memory: Immediate Good; Recent Good; Remote Good  Judgment: Fair  Insight: Fair   Chartered certified accountant: Fair  Attention Span: Fair  Recall: [employer] of Knowledge: Fair  Language: Fair   Psychomotor Activity  Psychomotor Activity:  No data recorded nml  Assets  Assets: Physical Health; Resilience   Sleep  Sleep: No data recorded nml   Physical Exam: Physical Exam Vitals reviewed.    Review of Systems  Psychiatric/Behavioral:  Positive for depression. Negative for hallucinations, substance abuse and suicidal ideas. The patient is nervous/anxious. The patient does not have insomnia.    Blood pressure (!) 111/59, pulse 89, temperature 98.2 F (36.8 C), temperature source Oral, resp. rate 18, SpO2 100 %. There is no height or weight on file to calculate BMI.   Treatment Plan Summary: Daily contact with patient to assess and evaluate symptoms and  progress in treatment  Assessment: Diagnoses / Active Problems:   Schizoaffective disorder (HCC)   GAD (generalized anxiety disorder)   Vitamin D deficiency   Sialorrhea  Plan  Safety and Monitoring: -- Voluntary admission to inpatient psychiatric unit for safety, stabilization and treatment -- Daily contact with patient to assess and evaluate symptoms and progress in treatment -- Patient's case to be discussed in multi-disciplinary team meeting -- Observation Level : q15 minute checks -- Vital signs:  q12 hours -- Precautions: suicide, elopement, and assault   2. Medications:  #Schizoaffective disorder, bipolar type -- Continue clozapine 250 mg qhs - for psychosis. ANC 2200 today. Recheck cbc-d on Saturday (already ordered).  -- Continue atropine drops 1% 1 drop tid for sialorrhea.  -- Continue Zoloft to 125 mg in the morning for mdd and gad    #HTN -- Continue Norvasc 10 mg daily for hypertension   #Tachycardia - Continue atenolol 12.5 mg daily - for tachycardia 2/2 clozapine  - Continue to monitor, low threshold to obtain EKG    #Constipation -Continue Colace 100 mg 2 twice daily for constipation -Continue Miralax daily for constipation   #Nutritional needs  -- Continue Ensure TID for nutritional support   #Iron deficiency  -- Continue Ferrous Sulfate 325 mg daily for iron replacement   #Weight gain Ppx -- Continue Metformin 500 mg XL for weight gain prophylaxis   #Anxiety -- Continue Atarax 25 mg TID PRN for anxiety   No tobacco use -- Patient does not need nicotine replacement   The patient is agreeable with the medication plan, as above. We will monitor the patient's response to pharmacologic treatment, and adjust medications as necessary.    Cristy Hilts, MD 10/08/2022, 2:56 PM  Total Time Spent in Direct Patient Care:  I personally spent 25 minutes on the unit in direct patient care. The direct patient care time included face-to-face time with  the patient, reviewing the patient's chart, communicating with other professionals, and coordinating care. Greater than 50% of this time was spent in counseling or coordinating care with the patient regarding goals of hospitalization, psycho-education, and discharge planning needs.   Phineas Inches, MD Psychiatrist

## 2022-10-08 NOTE — BHH Counselor (Signed)
CSW spoke with Mrs. Cheryl Adams [phone] who states that her supervisor, Rodena Medin, is continuing to work on the payment for the group home.  She states that 10/13/2022 is the last day that the [facility] office will pay for her to be moved into a group home.  She states that she is wanting to have the Pt moved into the group home in [place] by that date.  She states that she will need a new FL2, TB test, and Covid test from the CSW before 10/13/2022 to get the Pt into the group home on time.  CSW has completed a new FL2, has a print out of the Covid results, and is waiting for the TB test to be read by a nurse.  Once these items are completed CSW will fax them to [name] at [phone].

## 2022-10-08 NOTE — Group Note (Signed)
Recreation Therapy Group Note   Group Topic:Other  Group Date: 10/08/2022 Start Time: 0955 End Time: 1040 Facilitators: Kiyonna Tortorelli-McCall, LRT,CTRS Location: [address]   Goal Area(s) Addresses:  Patient will use appropriate dances as a way of self expression.   Patient will use dance as a way to release stress.   Group Description: Music Therapy.  LRT and patients talked about the importance of using music as a way to express one's self and the benefits.  Patients were allowed to pick songs that interested them and dance along to them.  Patients were to choose songs that were clean and appropriate.     Affect/Mood: Appropriate   Participation Level: Engaged   Participation Quality: Independent   Behavior: Appropriate   Speech/Thought Process: Focused   Insight: Good   Judgement: Good   Modes of Intervention: Music   Patient Response to Interventions:  Engaged   Education Outcome:  Acknowledges education and In group clarification offered    Clinical Observations/Individualized Feedback: Pt was shy and hesitant at the beginning of group.  Pt started to loosen up and become more engaged with encouragement from staff and peers.  Pt even requested to hear the [employer] song "Ain't Nobody" to which pt seemed to be very into as she danced.     Plan: Continue to engage patient in RT group sessions 2-3x/week.   Kami Kube-McCall, LRT,CTRS 10/08/2022 11:50 AM

## 2022-10-09 DIAGNOSIS — F250 Schizoaffective disorder, bipolar type: Secondary | ICD-10-CM

## 2022-10-09 NOTE — Group Note (Signed)
Recreation Therapy Group Note   Group Topic:Stress Management  Group Date: 10/09/2022 Start Time: 1000 End Time: 1025 Facilitators: Lititia Sen-McCall, LRT,CTRS Location: [address]  Goal Area(s) Addresses:  Patient will identify positive stress management techniques. Patient will identify benefits of using stress management post d/c.   Group Description:  Meditation.  LRT and patients discussed the importance of meditation and the impact it has on you.  LRT played a meditation called Morning Meditation.  Patients were to listen and follow along as meditation played to get relaxed and focus on how meditation helps to relieve stress.  LRT shared with patients to explore meditation through apps, Youtube and other means available.       Affect/Mood: Tired   Participation Level: None   Participation Quality: N/A   Behavior: Sleep   Speech/Thought Process: N/A   Insight: N/A   Judgement: N/A   Modes of Intervention: Meditation   Patient Response to Interventions:  Sleep   Education Outcome:  N/A   Clinical Observations/Individualized Feedback: Pt was sleep throughout group.  Pt did not participate.   Plan: Continue to engage patient in RT group sessions 2-3x/week.   Ahava Kissoon-McCall, LRT,CTRS 10/09/2022 10:44 AM

## 2022-10-09 NOTE — Group Note (Signed)
LCSW Group Therapy Note  Group Date: 10/09/2022 Start Time: 1100 End Time: 1200   Type of Therapy and Topic:  Group Therapy - How To Cope with Nervousness about Discharge   Participation Level:  Minimal   Description of Group This process group involved identification of patients' feelings about discharge. Some of them are scheduled to be discharged soon, while others are new admissions, but each of them was asked to share thoughts and feelings surrounding discharge from the hospital. One common theme was that they are excited at the prospect of going home, while another was that many of them are apprehensive about sharing why they were hospitalized. Patients were given the opportunity to discuss these feelings with their peers in preparation for discharge.  Therapeutic Goals  Patient will identify their overall feelings about pending discharge. Patient will think about how they might proactively address issues that they believe will once again arise once they get home (i.e. with parents). Patients will participate in discussion about having hope for change.   Summary of Patient Progress:  Maelys during group provided her name to CSW and her feelings towards " Discharge " . Patient throughout the remaining of the group was sleep.    Therapeutic Modalities Cognitive Behavioral Therapy   Beather Arbour 10/09/2022  11:44 AM

## 2022-10-09 NOTE — Progress Notes (Signed)
Adult Psychoeducational Group Note  Date:  10/09/2022 Time:  9:50 PM  Group Topic/Focus:  Wrap-Up Group:   The focus of this group is to help patients review their daily goal of treatment and discuss progress on daily workbooks.  Participation Level:  Minimal  Participation Quality:  Appropriate  Affect:  Flat  Cognitive:  Appropriate  Insight: Appropriate  Engagement in Group:  Developing/Improving  Modes of Intervention:  Discussion  Additional Comments:  Pt stated her goal for today was to focus on her treatment plan. Pt stated she accomplished her goal today. Pt stated she talked with her doctor and with her social worker about her care today. Pt rated her overall day a 4 out of 10. Pt stated she has been having negative thoughts all day. Writer assured pt we are here to help, so if she has negative thoughts tonight to let staff know. Pt stated she understood. Pt stated she made no calls today. Pt stated she felt better about herself tonight. Pt stated she was able to attend all meals today. Pt stated she took all medications provided today. Pt stated her appetite was fair today. Pt rated her sleep last night was fair. Pt stated the goal tonight was to get some rest. Pt stated she had no physical pain tonight. Pt deny visual hallucinations and auditory issues tonight. Pt denies thoughts of harming herself or others. Pt stated she would alert staff if anything changed. Felipa Furnace 10/09/2022, 9:50 PM

## 2022-10-09 NOTE — Progress Notes (Signed)
Report received from RN including SBAR. Patient alert and oriented, warm and dry, and in no acute distress. Patient denies SI, HI, AVH and pain. Q15 minute safety checks ongoing without self harm gestures. . Patient instructed to come to this nurse with needs or concerns.

## 2022-10-09 NOTE — BHH Group Notes (Signed)
Spirituality Group facilitated by Kathleen Argue, Bcc  Group focused on topic of strength. Group members reflected on what thoughts and feelings emerge when they hear this topic. They then engaged in facilitated dialog around how strength is present in their lives. This dialog focused on representing what strength had been to them in their lives (images and patterns given) and what they saw as helpful in their life now (what they needed / wanted).  Activity drew on narrative framework.  Patient Progress: Cheryl Adams was more alert than I have seen her recently.  She actively engaged in the group conversation and her comments showed insight into the topic.  She was supportive of peers as well.  [address] [facility] Pager, [phone]

## 2022-10-09 NOTE — Plan of Care (Signed)
  Problem: Education: Goal: Knowledge of [facility] Education information/materials will improve Outcome: Progressing   Problem: Education: Goal: Emotional status will improve Outcome: Progressing   Problem: Education: Goal: Mental status will improve Outcome: Progressing   Problem: Education: Goal: Verbalization of understanding the information provided will improve Outcome: Progressing   Problem: Education: Goal: Verbalization of understanding the information provided will improve Outcome: Progressing   Problem: Activity: Goal: Interest or engagement in activities will improve Outcome: Progressing   Problem: Coping: Goal: Ability to verbalize frustrations and anger appropriately will improve Outcome: Progressing  Patient  compliant with treatment denies SI/HI/A/VH and verbally contracted for safety.

## 2022-10-09 NOTE — [facility] Group Notes (Signed)
[facility] Group Notes:  (Nursing/MHT/Case Management/Adjunct)  Date:  10/09/2022  Time:  9:40 AM  Type of Therapy:   Orientation group  Participation Level:  Minimal  Participation Quality:  Appropriate and Attentive  Affect:  Appropriate  Cognitive:  Alert and Appropriate  Insight:  Appropriate and Good  Engagement in Group:  Supportive  Modes of Intervention:  Education  Summary of Progress/Problems:  Discussed goals and expectation for the day.  Cheryl Adams [name] 10/09/2022, 9:40 AM

## 2022-10-09 NOTE — Progress Notes (Signed)
   10/09/22 2000  Psych Admission Type (Psych Patients Only)  Admission Status Voluntary  Psychosocial Assessment  Patient Complaints Depression  Eye Contact Avertive;Brief  Facial Expression Fixed smile  Affect Appropriate to circumstance  Speech Logical/coherent  Interaction Childlike;Cautious  Motor Activity Slow  Appearance/Hygiene Improved  Behavior Characteristics Cooperative  Mood Euthymic  Aggressive Behavior  Effect No apparent injury  Thought Process  Coherency WDL  Content Paranoia  Delusions None reported or observed  Perception WDL  Hallucination None reported or observed  Judgment Poor  Confusion None  Danger to Self  Current suicidal ideation?  (Denies)  Agreement Not to Harm Self Yes  Description of Agreement Verbal contract  Danger to Others  Danger to Others None reported or observed

## 2022-10-09 NOTE — Progress Notes (Signed)
[facility] MD Progress Note  10/09/2022 6:40 PM Cheryl Adams  MRN:  [id number]  Subjective:    Cheryl Adams is a 31 year old African-American female with prior psychiatric diagnoses of schizoaffective d/o depressive type and GAD who initially presented to the [facility] urgent care [facility]) accompanied by her mother with complaints of paranoia. She was previously admitted to [facility] from 6/20-10/10/2022 and treated for schizoaffective disorder, depressive type with multiple medication trials (Geodon, haldol, caplyta, thorazine, Zyprexa, Risperdal) and ultimately had partial response to clozapine. DSS was granted legal guardianship. On 09/10/2022, pt was d/c for competency evaluation per court. On 09/10/22, patient was readmitted to the psych unit after the evaluation.  Pt is slightly less paranoid and less anxious today.  She is seen during that therapy group doing meditation.  She states that this is helpful to her.  Patient describes that she does feel safe while in the hospital, but notes "my thoughts scare me.  Anything can happen at any time.  She continues to endorse "negative thoughts", but does not elaborate on what these negative thoughts are.  She continues to endorse feelings of depression and anxiety.  She has stayed in the day room for majority of shift.  She does continue to feel sleepy, but is not tearful, emotional, or agitated. Denying SI and HI. Denying AH, VH.   We discussed that ANC is slightly up and will rechecked 10/09/2022 with p.m. lab draws.    Targeted review of symptoms, specific for clozapine: Malaise/Sedation: has some daytime fatigue Chest pain: denies Shortness of breath: denies Exertional capacity: denies Tachycardia: None, slight hypotension Cough: denies Sore Throat: denies Fever: denies Orthostatic hypotension (dizziness with standing): denies Hypersalivation: at low level Constipation: denies  Nausea: denies Nocturnal enuresis:  denies    Principal Problem: Schizoaffective disorder (HCC) Diagnosis: Principal Problem:   Schizoaffective disorder (HCC) Active Problems:   GAD (generalized anxiety disorder)   Vitamin D deficiency   Sialorrhea  Total Time Spent in Direct Patient Care:  I personally spent 35 minutes on the unit in direct patient care. The direct patient care time included face-to-face time with the patient, reviewing the patient's chart, communicating with other professionals, and coordinating care. Greater than 50% of this time was spent in counseling or coordinating care with the patient regarding goals of hospitalization, psycho-education, and discharge planning needs.   Past Psychiatric History:  MDD, GAD, ultimately diagnosed with schizoaffective disorder depressive type.    Past Medical History:  Past Medical History:  Diagnosis Date   Anemia    Chronic tonsillitis 10/2014   Cough 11/09/2014   Difficulty swallowing pills    No psychiatric disorder found after evaluation 04/14/2022    Past Surgical History:  Procedure Laterality Date   TONSILLECTOMY AND ADENOIDECTOMY Bilateral 11/14/2014   Procedure: BILATERAL TONSILLECTOMY AND ADENOIDECTOMY;  Surgeon: Flo Shanks, MD;  Location: [facility];  Service: ENT;  Laterality: Bilateral;   TYMPANOSTOMY TUBE PLACEMENT     Family History: History reviewed. No pertinent family history.  Family Psychiatric  History:  Schizophrenia- Maternal grandfather.    Social History:  Social History   Substance and Sexual Activity  Alcohol Use No     Social History   Substance and Sexual Activity  Drug Use No    Social History   Socioeconomic History   Marital status: Single    Spouse name: Not on file   Number of children: Not on file   Years of education: Not on file  Highest education level: Not on file  Occupational History   Not on file  Tobacco Use   Smoking status: Never   Smokeless tobacco: Never  Substance and  Sexual Activity   Alcohol use: No   Drug use: No   Sexual activity: Not on file  Other Topics Concern   Not on file  Social History Narrative   Not on file   Social Determinants of Health   Financial Resource Strain: Not on file  Food Insecurity: Unknown (09/10/2022)   Hunger Vital Sign    Worried About Running Out of Food in the Last Year: Patient refused    Ran Out of Food in the Last Year: Patient refused  Transportation Needs: Unknown (09/10/2022)   PRAPARE - Administrator, Civil Service (Medical): Patient refused    Lack of Transportation (Non-Medical): Patient refused  Physical Activity: Not on file  Stress: Not on file  Social Connections: Not on file   Additional Social History:                         Sleep: Good  Appetite:  Fair  Current Medications: Current Facility-Administered Medications  Medication Dose Route Frequency Provider Last Rate Last Admin   acetaminophen (TYLENOL) tablet 650 mg  650 mg Oral Q6H PRN Massengill, Harrold Donath, MD       alum & mag hydroxide-simeth (MAALOX/MYLANTA) 200-200-20 MG/5ML suspension 30 mL  30 mL Oral Q4H PRN Massengill, Nathan, MD       amLODipine (NORVASC) tablet 10 mg  10 mg Oral Daily Massengill, Nathan, MD   10 mg at 10/09/22 0737   antiseptic oral rinse (BIOTENE) solution 15 mL  15 mL Mouth Rinse 5 X Daily PRN Massengill, Harrold Donath, MD       atenolol (TENORMIN) tablet 12.5 mg  12.5 mg Oral Daily Massengill, Nathan, MD   12.5 mg at 10/09/22 0735   atropine 1 % ophthalmic solution 1 drop  1 drop Sublingual TID Phineas Inches, MD   1 drop at 10/09/22 1758   cloZAPine (CLOZARIL) tablet 50 mg  50 mg Oral QHS Massengill, Harrold Donath, MD   50 mg at 10/08/22 2041   docusate sodium (COLACE) capsule 100 mg  100 mg Oral BID Massengill, Harrold Donath, MD   100 mg at 10/09/22 1757   ferrous sulfate tablet 325 mg  325 mg Oral Daily Massengill, Harrold Donath, MD   325 mg at 10/09/22 0737   magnesium hydroxide (MILK OF MAGNESIA) suspension  30 mL  30 mL Oral Daily PRN Massengill, Harrold Donath, MD       metFORMIN (GLUCOPHAGE-XR) 24 hr tablet 500 mg  500 mg Oral Q breakfast Massengill, Nathan, MD   500 mg at 10/09/22 0738   polyethylene glycol (MIRALAX / GLYCOLAX) packet 17 g  17 g Oral Daily Massengill, Harrold Donath, MD   17 g at 10/09/22 0736   sertraline (ZOLOFT) tablet 125 mg  125 mg Oral Daily Massengill, Harrold Donath, MD   125 mg at 10/09/22 0737   traZODone (DESYREL) tablet 50 mg  50 mg Oral QHS PRN Karie Fetch, MD   50 mg at 10/08/22 2041   vitamin D3 (CHOLECALCIFEROL) tablet 1,000 Units  1,000 Units Oral Daily Phineas Inches, MD   1,000 Units at 10/09/22 8756    Lab Results:  Results for orders placed or performed during the hospital encounter of 09/10/22 (from the past 48 hour(s))  CBC with Differential/Platelet     Status: Abnormal   Collection Time:  10/08/22  6:30 AM  Result Value Ref Range   WBC 3.9 (L) 4.0 - 10.5 K/uL   RBC 4.84 3.87 - 5.11 MIL/uL   Hemoglobin 12.3 12.0 - 15.0 g/dL   HCT 32.3 55.7 - 32.2 %   MCV 83.9 80.0 - 100.0 fL   MCH 25.4 (L) 26.0 - 34.0 pg   MCHC 30.3 30.0 - 36.0 g/dL   RDW 02.5 42.7 - 06.2 %   Platelets 243 150 - 400 K/uL   nRBC 0.0 0.0 - 0.2 %   Neutrophils Relative % 56 %   Neutro Abs 2.2 1.7 - 7.7 K/uL   Lymphocytes Relative 31 %   Lymphs Abs 1.2 0.7 - 4.0 K/uL   Monocytes Relative 8 %   Monocytes Absolute 0.3 0.1 - 1.0 K/uL   Eosinophils Relative 3 %   Eosinophils Absolute 0.1 0.0 - 0.5 K/uL   Basophils Relative 1 %   Basophils Absolute 0.0 0.0 - 0.1 K/uL   Immature Granulocytes 1 %   Abs Immature Granulocytes 0.02 0.00 - 0.07 K/uL    Comment: Performed at [facility], [address], [place], [place] [address]    Blood Alcohol level:  Lab Results  Component Value Date   [facility] <10 10/16/2021   ETH <10 04/24/2021    Metabolic Disorder Labs: Lab Results  Component Value Date   HGBA1C 5.2 05/18/2022   MPG 102.54 05/18/2022   MPG 120 04/29/2021   Lab  Results  Component Value Date   PROLACTIN 18.5 06/18/2022   PROLACTIN 58.3 (H) 06/06/2022   Lab Results  Component Value Date   CHOL 217 (H) 05/18/2022   TRIG 31 05/18/2022   HDL 71 05/18/2022   CHOLHDL 3.1 05/18/2022   VLDL 6 05/18/2022   LDLCALC 140 (H) 05/18/2022   LDLCALC 94 05/02/2021    Physical Findings: AIMS: Facial and Oral Movements Muscles of Facial Expression: None, normal Lips and Perioral Area: None, normal Jaw: None, normal Tongue: None, normal,Extremity Movements Upper (arms, wrists, hands, fingers): None, normal Lower (legs, knees, ankles, toes): None, normal, Trunk Movements Neck, shoulders, hips: None, normal, Overall Severity Severity of abnormal movements (highest score from questions above): None, normal Incapacitation due to abnormal movements: None, normal Patient's awareness of abnormal movements (rate only patient's report): No Awareness, Dental Status Current problems with teeth and/or dentures?: No Does patient usually wear dentures?: No  CIWA:    COWS:     Musculoskeletal: Strength & Muscle Tone: within normal limits Gait & Station: normal Patient leans: N/A  Psychiatric Specialty Exam:  Presentation  General Appearance:  Casual  Eye Contact: Poor  Speech: Slow  Speech Volume: Decreased  Handedness: Right   Mood and Affect  Mood: Depressed; Anxious  Affect: Flat   Thought Process  Thought Processes: Linear  Descriptions of Associations:Intact  Orientation:Full (Time, Place and Person)  Thought Content:Paranoid Ideation  History of Schizophrenia/Schizoaffective disorder:Yes  Duration of Psychotic Symptoms:Greater than six months  Hallucinations:Hallucinations: None  Denies ah, vh  Ideas of Reference:None  Suicidal Thoughts:Suicidal Thoughts: No  Denies  Homicidal Thoughts:Homicidal Thoughts: No  Denies    Sensorium  Memory: Immediate Fair; Recent Fair; Remote  Fair  Judgment: Fair  Insight: Fair   Art therapist  Concentration: Fair  Attention Span: Fair  Recall: [employer] of Knowledge: Fair  Language: Fair   Psychomotor Activity  Psychomotor Activity: Psychomotor Activity: Decreased Extrapyramidal Side Effects (EPS): -- (none) AIMS Completed?: No  nml  Assets  Assets: Physical  Health; Resilience   Sleep  Sleep: Sleep: Good  nml   Physical Exam: Physical Exam Vitals and nursing note reviewed. Exam conducted with a chaperone present.  Constitutional:      Appearance: She is obese.     Comments: Sleepy   HENT:     Nose: No congestion.  Cardiovascular:     Rate and Rhythm: Normal rate.  Pulmonary:     Effort: Pulmonary effort is normal. No respiratory distress.  Neurological:     General: No focal deficit present.    Review of Systems  Neurological: Negative.   Psychiatric/Behavioral:  Positive for depression. Negative for hallucinations, substance abuse and suicidal ideas. The patient is nervous/anxious. The patient does not have insomnia.    Blood pressure (!) 113/53, pulse 88, temperature 98.5 F (36.9 C), temperature source Oral, resp. rate 16, SpO2 100 %. There is no height or weight on file to calculate BMI.   Treatment Plan Summary: Daily contact with patient to assess and evaluate symptoms and progress in treatment  Assessment: Diagnoses / Active Problems:   Schizoaffective disorder (HCC)   GAD (generalized anxiety disorder)   Vitamin D deficiency   Sialorrhea  Plan  Safety and Monitoring: -- Voluntary admission to inpatient psychiatric unit for safety, stabilization and treatment -- Daily contact with patient to assess and evaluate symptoms and progress in treatment -- Patient's case to be discussed in multi-disciplinary team meeting -- Observation Level : q15 minute checks -- Vital signs:  q12 hours -- Precautions: suicide, elopement, and assault   2. Medications:   #Schizoaffective disorder, bipolar type -- Continue clozapine 250 mg qhs - for psychosis. ANC with being labs today 10/09/2022 -- Continue atropine drops 1% 1 drop tid for sialorrhea.  -- Continue Zoloft to 125 mg in the morning for MDD and gad    #HTN -- Continue Norvasc 10 mg daily for hypertension   #Tachycardia - Continue atenolol 12.5 mg daily - for tachycardia 2/2 clozapine  - Continue to monitor, low threshold to obtain EKG    #Constipation -Continue Colace 100 mg 2 twice daily for constipation -Continue Miralax daily for constipation   #Nutritional needs  -- Continue Ensure TID for nutritional support   #Iron deficiency  -- Continue Ferrous Sulfate 325 mg daily for iron replacement   #Weight gain Ppx -- Continue Metformin 500 mg XL for weight gain prophylaxis   #Anxiety -- Continue Atarax 25 mg TID PRN for anxiety   No tobacco use -- Patient does not need nicotine replacement   The patient is agreeable with the medication plan, as above. We will monitor the patient's response to pharmacologic treatment, and adjust medications as necessary.    Mariel Craft, MD 10/09/2022, 6:40 PM

## 2022-10-10 LAB — CBC WITH DIFFERENTIAL/PLATELET
Abs Immature Granulocytes: 0.02 10*3/uL (ref 0.00–0.07)
Basophils Absolute: 0 10*3/uL (ref 0.0–0.1)
Basophils Relative: 1 %
Eosinophils Absolute: 0.2 10*3/uL (ref 0.0–0.5)
Eosinophils Relative: 4 %
HCT: 41.3 % (ref 36.0–46.0)
Hemoglobin: 12.6 g/dL (ref 12.0–15.0)
Immature Granulocytes: 1 %
Lymphocytes Relative: 33 %
Lymphs Abs: 1.3 10*3/uL (ref 0.7–4.0)
MCH: 25.8 pg — ABNORMAL LOW (ref 26.0–34.0)
MCHC: 30.5 g/dL (ref 30.0–36.0)
MCV: 84.6 fL (ref 80.0–100.0)
Monocytes Absolute: 0.3 10*3/uL (ref 0.1–1.0)
Monocytes Relative: 6 %
Neutro Abs: 2.2 10*3/uL (ref 1.7–7.7)
Neutrophils Relative %: 55 %
Platelets: 241 10*3/uL (ref 150–400)
RBC: 4.88 MIL/uL (ref 3.87–5.11)
RDW: 14.1 % (ref 11.5–15.5)
WBC: 4 10*3/uL (ref 4.0–10.5)
nRBC: 0 % (ref 0.0–0.2)

## 2022-10-10 MED ORDER — CLOZAPINE 25 MG PO TABS
75.0000 mg | ORAL_TABLET | Freq: Every day | ORAL | Status: DC
  Administered 2022-10-10 – 2022-10-11 (×2): 75 mg via ORAL
  Filled 2022-10-10 (×3): qty 3

## 2022-10-10 NOTE — Progress Notes (Signed)
Adult Psychoeducational Group Note  Date:  10/10/2022 Time:  9:39 PM  Group Topic/Focus:  Wrap-Up Group:   The focus of this group is to help patients review their daily goal of treatment and discuss progress on daily workbooks.  Participation Level:  Active  Participation Quality:  Appropriate  Affect:  Appropriate  Cognitive:  Appropriate  Insight: Appropriate  Engagement in Group:  Engaged  Modes of Intervention:  Discussion, Rapport Building, and Support  Additional Comments:    Pt attended and participated in the Wrap Up group. Pt denied SI/HI/AVH and pain. Pt endorsed anxiety and depression. Pt receptive to the encouragement and support offered by Clinical research associate and peers. Pt reports no progress toward improving anxiety and depression management. Pt attentive during the review of helpful coping skills. Pt agreed to reach out to staff when overwhelmed with anxiety and depression. Pt made a verbal commitment to practice deep breathing, counting, positive self-talk and writing  to improve daily coping.  Cheryl Adams 10/10/2022, 9:39 PM

## 2022-10-10 NOTE — Progress Notes (Signed)
[facility] MD Resident Progress Note  10/10/2022 2:27 PM MACKENZY GRUMBINE  MRN:  [id number] Principal Problem: Schizoaffective disorder [facility]) Diagnosis: Principal Problem:   Schizoaffective disorder (HCC) Active Problems:   GAD (generalized anxiety disorder)   Vitamin D deficiency   Sialorrhea  Reason for admission   Cheryl Adams is a 31 year old African-American female with prior psychiatric diagnoses of schizoaffective d/o depressive type and GAD who initially presented to the [facility] health urgent care [facility]) accompanied by her mother with complaints of paranoia. She was previously admitted to [facility] from 6/20-10/10/2022 and treated for schizoaffective disorder, depressive type with multiple medication trials (Geodon, haldol, caplyta, thorazine, Zyprexa, Risperdal) and ultimately had partial response to clozapine. DSS was granted legal guardianship. On 09/10/2022, pt was d/c for competency evaluation per court. On 09/10/22, patient was readmitted to the psych unit after the evaluation. (admitted on 09/10/2022, total  LOS: 30 days )  Chart review from last 24 hours   The patient's chart was reviewed and nursing notes were reviewed. The patient's case was discussed in multidisciplinary team meeting.  - Overnight events per chart review: attended group. Continued to have negative thoughts all day.   - Patient took all scheduled meds  - Patient took PRN trazodone 50mg  (2106)  Information obtained during interview   The patient was seen and evaluated on the unit. On assessment today the patient reports she is doing "okay, trying to maintain my mental health."  Asked what she does to try to maintain her mental health, she reports "meditating, exercising."  She reports "no more drooling."  She reports her sleep is fair, she fell asleep okay.  She reports eating dinner yesterday and breakfast this morning.  She reports her last bowel movement was yesterday.  She denies any chest pain, nausea,  shortness of breath, dizziness, headache.   She continues to report worsening thoughts that people are going to hurt her.  When asked who will hurt her she reports "I do not know."  When asked the people in the hospital will hurt her she reports "maybe."  When asked if the nurses or doctors will hurt her she reports "not sure".  She is worried about people hurting her outside the hospital, "not sure" to.  She reports she sometimes feels safe in the hospital.  She reports that predictability makes her feel safe, she feels anxious when things are unpredictable.  She denies thought insertion, thought withdrawal, thought broadcasting.  She denies auditory or visual hallucinations.  She denies suicidal or homicidal ideation.  She reports ongoing anxiety about "things I cannot control" she reports she copes with anxiety by listening to music and exercising.  Review of Systems  Respiratory:  Negative for shortness of breath.   Cardiovascular:  Negative for chest pain.  Gastrointestinal:  Negative for abdominal pain, constipation, diarrhea, nausea and vomiting.  Neurological:  Negative for headaches.   Targeted review of symptoms, specific for clozapine: Malaise/Sedation: denies Chest pain: denies Shortness of breath: denies Exertional capacity: denies Tachycardia: Not noted on vitals  Cough: denies Sore Throat: denies Fever: Not noted on vitals  Orthostatic hypotension (dizziness with standing): denies Hypersalivation: denies Constipation: denies Nausea: denies Nocturnal enuresis: denies  Symptoms of GERD -Acid reflux: denies -Burning sensation in chest, usually after eating: denies -Trouble swallowing: denies -Sensation of lump in throat: denies -Regurgitation (backwash) of food or sour liquid: denies    Objective   Blood pressure 115/66, pulse 64, temperature 98.2 F (36.8 C), temperature source Oral, resp. rate  20, SpO2 100 %. There is no height or weight on file to calculate  BMI. Sleep: Good, 6 hours charted  Current Medications: Current Facility-Administered Medications  Medication Dose Route Frequency Provider Last Rate Last Admin   acetaminophen (TYLENOL) tablet 650 mg  650 mg Oral Q6H PRN Massengill, Nathan, MD       alum & mag hydroxide-simeth (MAALOX/MYLANTA) 200-200-20 MG/5ML suspension 30 mL  30 mL Oral Q4H PRN Massengill, Nathan, MD       amLODipine (NORVASC) tablet 10 mg  10 mg Oral Daily Massengill, Nathan, MD   10 mg at 10/10/22 0746   antiseptic oral rinse (BIOTENE) solution 15 mL  15 mL Mouth Rinse 5 X Daily PRN Massengill, Ovid Curd, MD       atenolol (TENORMIN) tablet 12.5 mg  12.5 mg Oral Daily Massengill, Nathan, MD   12.5 mg at 10/10/22 0746   atropine 1 % ophthalmic solution 1 drop  1 drop Sublingual TID Janine Limbo, MD   1 drop at 10/10/22 1206   cloZAPine (CLOZARIL) tablet 75 mg  75 mg Oral QHS Rolanda Lundborg, MD       docusate sodium (COLACE) capsule 100 mg  100 mg Oral BID Massengill, Ovid Curd, MD   100 mg at 10/10/22 0747   ferrous sulfate tablet 325 mg  325 mg Oral Daily Massengill, Ovid Curd, MD   325 mg at 10/10/22 0746   magnesium hydroxide (MILK OF MAGNESIA) suspension 30 mL  30 mL Oral Daily PRN Massengill, Ovid Curd, MD       metFORMIN (GLUCOPHAGE-XR) 24 hr tablet 500 mg  500 mg Oral Q breakfast Massengill, Nathan, MD   500 mg at 10/10/22 0747   polyethylene glycol (MIRALAX / GLYCOLAX) packet 17 g  17 g Oral Daily Massengill, Ovid Curd, MD   17 g at 10/10/22 0746   sertraline (ZOLOFT) tablet 125 mg  125 mg Oral Daily Massengill, Ovid Curd, MD   125 mg at 10/10/22 0746   traZODone (DESYREL) tablet 50 mg  50 mg Oral QHS PRN Rolanda Lundborg, MD   50 mg at 10/09/22 2106   vitamin D3 (CHOLECALCIFEROL) tablet 1,000 Units  1,000 Units Oral Daily Janine Limbo, MD   1,000 Units at 10/10/22 0746   Labs: 11/9 ANC 2.2, 11/11 ANC 2.2   Physical Exam Constitutional:      Appearance: the patient is not toxic-appearing.  Pulmonary:     Effort:  Pulmonary effort is normal.  Neurological:     General: No focal deficit present.     Mental Status: the patient is alert and oriented to person, place, and time.   AIMS:  Facial and Oral Movements Muscles of Facial Expression: None, normal Lips and Perioral Area: None, normal Jaw: None, normal Tongue: None, normal,Extremity Movements Upper (arms, wrists, hands, fingers): None, normal Lower (legs, knees, ankles, toes): None, normal Trunk Movements: None, normal  Neck, shoulders, hips: None, normal Overall Severity Severity of abnormal movements (highest score from questions above): None, normal Incapacitation due to abnormal movements: None, normal Patient's awareness of abnormal movements (rate only patient's report): No Awareness, Dental Status Current problems with teeth and/or dentures?: No Does patient usually wear dentures?: No   No stiffness, cogwheeling, or tremors noted on exam.    Mental Status Exam   Appearance and Grooming: Patient is casually dressed in V-neck shirt and jeans, she is wearing glasses . The patient has no noticeable scent or odor. Motor activity: The patient's movement speed was normal; her gait was normal. There was no  notable abnormal facial movements and no notable abnormal extremity movements. Behavior: The patient appears in no acute distress, and during the interview, was calm, focused, required minimal redirection, and behaving appropriately to scenario; she was able to follow commands and compliant to requests and made minimal eye contact. The patient appeared internally preoccupied, as evidenced by minimal eye contact toward examiner . Attitude: Patient's attitude towards the interviewer was cooperative. Speech: The patient's speech was clear, fluent, with good articulation, and with appropriately placed inflections. The volume of her speech was soft and sparse in quantity. The rate was normal with a normal rhythm. Responses were delayed. There  were no abnormal patterns in speech. Mood: "Okay" Affect: Patient's affect is anxious and flat with restricted range and even fluctuations; her affect is congruent with her stated mood. ------------------------------------------------------------------------------------------------------------------------- Thought Content The patient experiences no hallucinations. The patient describes no spontaneous delusions, but when prompted, expresses paranoid delusions that people will hurt her ; she denies thought insertion, denies thought withdrawal, denies thought interruption, and denies thought broadcasting. Patient at the time of interview denies active suicidal intent and denies passive suicidal ideation; she denies homicidal intent. Thought Process The patient's thought process is linear with poverty of thought and content. Insight The patient at the time of interview demonstrates poor insight, as evidenced by lacking understanding of mental health condition/s and inability to identify trigger/s causing mental health decompensation. Judgement The patient over the past 24 hours demonstrates poor judgement, as evidenced by minimally engaging with staff / other patients.  Assets: Physical Health; Resilience  Treatment Plan Summary: Daily contact with patient to assess and evaluate symptoms and progress in treatment and Medication management Summary   Leni Rabun is a 31 year old African-American female with prior psychiatric diagnoses of schizoaffective d/o depressive type and GAD who initially presented to the [employer] health urgent care [facility]) accompanied by her mother with complaints of paranoia. She was previously admitted to [facility] from 6/20-10/10/2022 and treated for schizoaffective disorder, depressive type with multiple medication trials (Geodon, haldol, caplyta, thorazine, Zyprexa, Risperdal) and ultimately had partial response to clozapine. DSS was granted legal guardianship.  On 09/10/2022, pt was d/c for competency evaluation per court. On 09/10/22, patient was readmitted to the psych unit after the evaluation. She is being treated for continued psychosis, MDD and GAD with medications and therapy.    Diagnoses / Active Problems: Schizoaffective disorder ([place]) Principal Problem:   Schizoaffective disorder ([place]) Active Problems:   GAD (generalized anxiety disorder)   Vitamin D deficiency   Sialorrhea  Plan  Safety and Monitoring: -- Voluntary admission to inpatient psychiatric unit for safety, stabilization and treatment -- Daily contact with patient to assess and evaluate symptoms and progress in treatment -- Patient's case to be discussed in multi-disciplinary team meeting -- Observation Level : q15 minute checks -- Vital signs:  q12 hours -- Precautions: suicide, elopement, and assault  2. Medications:  #Schizoaffective disorder, bipolar type Appears her Clozaril was decreased to 50 mg, perhaps due to concern for her cheeking medications? It was decreased 4 days ago. Unclear why her Clozaril was decreased however, given her continued paranoia and ANC within normal limits, we will increase her Clozaril to 75 mg tonight. -- INCREASE clozapine 75 mg qhs - for psychosis. ANC stable 2.2  -- Continue atropine drops 1% 1 drop tid for sialorrhea.  -- Continue Zoloft to 125 mg in the morning for MDD and GAD   #HTN -- Continue Norvasc 10 mg daily for hypertension   #  Tachycardia - Continue atenolol 12.5 mg daily - for tachycardia 2/2 clozapine  - Continue to monitor, low threshold to obtain EKG    #Constipation -Continue Colace 100 mg 2 twice daily for constipation -Continue Miralax daily for constipation   #Nutritional needs  -- Continue Ensure TID for nutritional support   #Iron deficiency  -- Continue Ferrous Sulfate 325 mg daily for iron replacement   #Weight gain Ppx -- Continue Metformin 500 mg XL for weight gain prophylaxis   #Anxiety --  Continue Atarax 25 mg TID PRN for anxiety   No tobacco use -- Patient does not need nicotine replacement  The risks/benefits/side-effects/alternatives to the above medication were discussed in detail with the patient and time was given for questions. The patient consents to medication trial. FDA black box warnings, if present, were discussed.  The patient is agreeable with the medication plan, as above. We will monitor the patient's response to pharmacologic treatment, and adjust medications as necessary.  3. Routine and other pertinent labs: EKG monitoring: QTc: 441 (11/7) Lab Results:     Latest Ref Rng & Units 10/10/2022    6:44 AM 10/08/2022    6:30 AM 10/07/2022    6:38 AM  CBC  WBC 4.0 - 10.5 K/uL 4.0  3.9  3.9   Hemoglobin 12.0 - 15.0 g/dL 12.6  12.3  12.5   Hematocrit 36.0 - 46.0 % 41.3  40.6  40.3   Platelets 150 - 400 K/uL 241  243  246       Latest Ref Rng & Units 10/07/2022    6:38 AM 06/18/2022    6:27 PM 05/31/2022    6:42 AM  BMP  Glucose 70 - 99 mg/dL 89  117  101   BUN 6 - 20 mg/dL 12  11  6    Creatinine 0.44 - 1.00 mg/dL 0.70  0.70  0.82   Sodium 135 - 145 mmol/L 142  141  137   Potassium 3.5 - 5.1 mmol/L 3.5  3.5  3.9   Chloride 98 - 111 mmol/L 109  109  103   CO2 22 - 32 mmol/L 26  26  28    Calcium 8.9 - 10.3 mg/dL 8.9  9.3  8.9    Blood Alcohol level:  Lab Results  Component Value Date   ETH <10 10/16/2021   ETH <10 04/24/2021   Prolactin: Lab Results  Component Value Date   PROLACTIN 18.5 06/18/2022   PROLACTIN 58.3 (H) 06/06/2022   Lipid Panel: Lab Results  Component Value Date   CHOL 217 (H) 05/18/2022   TRIG 31 05/18/2022   HDL 71 05/18/2022   CHOLHDL 3.1 05/18/2022   VLDL 6 05/18/2022   LDLCALC 140 (H) 05/18/2022   LDLCALC 94 05/02/2021   HbgA1c: Lab Results  Component Value Date   HGBA1C 5.2 05/18/2022   TSH: Lab Results  Component Value Date   TSH 0.964 06/18/2022    4. Group Therapy: -- Encouraged patient to participate  in unit milieu and in scheduled group therapies  -- Short Term Goals: Ability to identify changes in lifestyle to reduce recurrence of condition will improve, Ability to verbalize feelings will improve, Ability to disclose and discuss suicidal ideas, Ability to demonstrate self-control will improve, Ability to identify and develop effective coping behaviors will improve, Ability to maintain clinical measurements within normal limits will improve, Compliance with prescribed medications will improve, and Ability to identify triggers associated with substance abuse/mental health issues will improve -- Long Term Goals:  Improvement in symptoms so as ready for discharge -- Patient is encouraged to participate in group therapy while admitted to the psychiatric unit. -- We will address other chronic and acute stressors, which contributed to the patient's Schizoaffective disorder ([place]) in order to reduce the risk of self-harm at discharge.  5. Discharge Planning:  -- Social work and case management to assist with discharge planning and identification of hospital follow-up needs prior to discharge -- Estimated LOS: 5-7 days -- Discharge Concerns: Need to establish a safety plan; Medication compliance and effectiveness -- Discharge Goals: Return home with outpatient referrals for mental health follow-up including medication management/psychotherapy  I certify that inpatient services furnished can reasonably be expected to improve the patient's condition.   I discussed my assessment, planned testing and intervention for the patient with Dr. Danella Sensing who agrees with my formulated course of action.  Rolanda Lundborg, MD, PGY-1 10/10/2022, 2:27 PM

## 2022-10-10 NOTE — Group Note (Signed)
[facility] LCSW Group Therapy Note  Date/Time:    10/10/2022   Type of Therapy and Topic:  Group Therapy:  Practicing Kindness to Self  Participation Level:  Did Not Attend   Description of Group:  The focus of this group is to examine human tendencies to be hyper-critical of self.  Coping skills to deal with this were discussed.  Multiple exercises were led in a shortened version of several of these coping skills.  Therapeutic Goals Share current healthy and unhealthy coping skills used when feeling bad about some aspect of self Practice multiple coping skills including Questioning whether they feel guilt ("I did something bad" "I made a mistake" "I did something stupid") or shame ("I am bad" "I am a mistake" "I am stupid") and whether this feeling is based in fact. 5 senses mindfulness Camera - Zoom In and Out  (Zooming in highlights our flaws and Zooming Out helps [place] to see ourselves more fully with flaws and assets) Grounding  [employer], Gratitude, Chiropractor, Engineer, water) Encourage to do the needed work to Nurse, mental health, focusing on how medication is part of the solution to emotional and mental problems, while coping skills are necessary to actually change behavior.  Summary of Patient Progress: Patient was invited to group, did not attend.    Therapeutic Modalities Processing Lecture Activities   Ambrose Mantle, [place] 10/10/2022, 5:50 PM

## 2022-10-10 NOTE — [facility] Group Notes (Signed)
Goals Group 10/10/22   Group Focus: affirmation, clarity of thought, and goals/reality orientation Treatment Modality:  Psychoeducation Interventions utilized were assignment, group exercise, and support Purpose: To be able to understand and verbalize the reason for their admission to the hospital. To understand that the medication helps with their chemical imbalance but they also need to work on their choices in life. To be challenged to develop a list of 30 positives about themselves. Also introduce the concept that "feelings" are not reality.  Participation Level:  Active  Participation Quality:  Appropriate  Affect:  Appropriate  Cognitive:  Appropriate  Insight:  Improving  Engagement in Group:  Engaged  Additional Comments: Rates her energy at a 4/10.   Dione Housekeeper

## 2022-10-10 NOTE — Plan of Care (Signed)
  Problem: Education: Goal: Emotional status will improve Outcome: Progressing  Patient has been able to come to staff during emotionally distressing times for support.

## 2022-10-10 NOTE — Progress Notes (Signed)
Patient denies SI, HI and AVH this shift. Patient has been engaged appropriately on the unit and has exhibited no behavioral dyscontrol.   Assess patient for safety, offer medications as prescribed, engage patient in 1:1 staff talks.   Patient able to contract for safety. Continue to monitor as planned.

## 2022-10-11 LAB — CBC WITH DIFFERENTIAL/PLATELET
Abs Immature Granulocytes: 0.03 10*3/uL (ref 0.00–0.07)
Basophils Absolute: 0 10*3/uL (ref 0.0–0.1)
Basophils Relative: 1 %
Eosinophils Absolute: 0.1 10*3/uL (ref 0.0–0.5)
Eosinophils Relative: 3 %
HCT: 41.1 % (ref 36.0–46.0)
Hemoglobin: 12.8 g/dL (ref 12.0–15.0)
Immature Granulocytes: 1 %
Lymphocytes Relative: 28 %
Lymphs Abs: 1.6 10*3/uL (ref 0.7–4.0)
MCH: 26 pg (ref 26.0–34.0)
MCHC: 31.1 g/dL (ref 30.0–36.0)
MCV: 83.4 fL (ref 80.0–100.0)
Monocytes Absolute: 0.3 10*3/uL (ref 0.1–1.0)
Monocytes Relative: 6 %
Neutro Abs: 3.5 10*3/uL (ref 1.7–7.7)
Neutrophils Relative %: 61 %
Platelets: 265 10*3/uL (ref 150–400)
RBC: 4.93 MIL/uL (ref 3.87–5.11)
RDW: 13.9 % (ref 11.5–15.5)
WBC: 5.6 10*3/uL (ref 4.0–10.5)
nRBC: 0 % (ref 0.0–0.2)

## 2022-10-11 NOTE — Progress Notes (Signed)
[facility] MD Progress Note  10/11/2022 10:37 AM Cheryl Adams  MRN:  [id number]  Principal Problem: Schizoaffective disorder ([place]) Diagnosis: Principal Problem:   Schizoaffective disorder ([place]) Active Problems:   GAD (generalized anxiety disorder)   Vitamin D deficiency   Sialorrhea  Cheryl Adams is a 31 year old African-American female with prior diagnoses of schizoaffective d/o depressive type and GAD who initially presented to the [facility] health urgent care [facility]) accompanied by her mother with complaints of paranoia. Pt was initially admittedon 05-19-2022, for many months, and treated for depression and paranoia with multiple medication and ultimately had partial response to clozapine. DSS was granted legal gaurdianship. On 09-10-2022, pt was d/c for MDE evaluation per court. On 09-10-2022, the pt was  readmitted to the psych unit after the evaluation.   Interval History Patient was seen today for re-evaluation.  Nursing reports no events overnight. The patient has no issues with performing ADLs.  Patient has been medication compliant.    Patient was seen and interviewed by attending psychiatrist. Chart reviewed. Patient discussed during treatment team rounds.  Subjective:  On assessment patient reports feeling "everything is good". She reports good mood, denies feeling depressed, anxious, panicky. She reports good sleep and appetite. She denies auditory or visual hallucinations. She continues reports feeling paranoid about her family, and believes that her family would harm her if she returns home. She denies thoughts or plans of hurting self or others. She reports no side effects from medications he is getting here. She denies any physical complaints.  Labs: no new results for review.     Past Psychiatric History: see H&P   Past Medical History:  Past Medical History:  Diagnosis Date   Anemia    Chronic tonsillitis 10/2014   Cough 11/09/2014   Difficulty swallowing  pills    No psychiatric disorder found after evaluation 04/14/2022    Past Surgical History:  Procedure Laterality Date   TONSILLECTOMY AND ADENOIDECTOMY Bilateral 11/14/2014   Procedure: BILATERAL TONSILLECTOMY AND ADENOIDECTOMY;  Surgeon: Cheryl Marble, MD;  Location: [place];  Service: ENT;  Laterality: Bilateral;   TYMPANOSTOMY TUBE PLACEMENT     Family History: History reviewed. No pertinent family history. Family Psychiatric  History: see H&*P  Social History:  Social History   Substance and Sexual Activity  Alcohol Use No     Social History   Substance and Sexual Activity  Drug Use No    Social History   Socioeconomic History   Marital status: Single    Spouse name: Not on file   Number of children: Not on file   Years of education: Not on file   Highest education level: Not on file  Occupational History   Not on file  Tobacco Use   Smoking status: Never   Smokeless tobacco: Never  Substance and Sexual Activity   Alcohol use: No   Drug use: No   Sexual activity: Not on file  Other Topics Concern   Not on file  Social History Narrative   Not on file   Social Determinants of Health   Financial Resource Strain: Not on file  Food Insecurity: Unknown (09/10/2022)   Hunger Vital Sign    Worried About Running Out of Food in the Last Year: Patient refused    [place] in the Last Year: Patient refused  Transportation Needs: Unknown (09/10/2022)   PRAPARE - Hydrologist (Medical): Patient refused    Lack of Transportation (  Non-Medical): Patient refused  Physical Activity: Not on file  Stress: Not on file  Social Connections: Not on file   Additional Social History:                         Sleep: Good  Appetite:  Good  Current Medications: Current Facility-Administered Medications  Medication Dose Route Frequency Provider Last Rate Last Admin   acetaminophen (TYLENOL) tablet 650 mg  650 mg  Oral Q6H PRN Massengill, Harrold Donath, MD       alum & mag hydroxide-simeth (MAALOX/MYLANTA) 200-200-20 MG/5ML suspension 30 mL  30 mL Oral Q4H PRN Massengill, Harrold Donath, MD       amLODipine (NORVASC) tablet 10 mg  10 mg Oral Daily Massengill, Harrold Donath, MD   10 mg at 10/11/22 8250   antiseptic oral rinse (BIOTENE) solution 15 mL  15 mL Mouth Rinse 5 X Daily PRN Massengill, Harrold Donath, MD       atenolol (TENORMIN) tablet 12.5 mg  12.5 mg Oral Daily Massengill, Nathan, MD   12.5 mg at 10/11/22 0723   atropine 1 % ophthalmic solution 1 drop  1 drop Sublingual TID Phineas Inches, MD   1 drop at 10/11/22 0725   cloZAPine (CLOZARIL) tablet 75 mg  75 mg Oral QHS Karie Fetch, MD   75 mg at 10/10/22 2049   docusate sodium (COLACE) capsule 100 mg  100 mg Oral BID Phineas Inches, MD   100 mg at 10/11/22 5397   ferrous sulfate tablet 325 mg  325 mg Oral Daily Massengill, Harrold Donath, MD   325 mg at 10/11/22 6734   magnesium hydroxide (MILK OF MAGNESIA) suspension 30 mL  30 mL Oral Daily PRN Phineas Inches, MD       metFORMIN (GLUCOPHAGE-XR) 24 hr tablet 500 mg  500 mg Oral Q breakfast Massengill, Harrold Donath, MD   500 mg at 10/11/22 0725   polyethylene glycol (MIRALAX / GLYCOLAX) packet 17 g  17 g Oral Daily Massengill, Harrold Donath, MD   17 g at 10/11/22 0725   sertraline (ZOLOFT) tablet 125 mg  125 mg Oral Daily Massengill, Harrold Donath, MD   125 mg at 10/11/22 1937   traZODone (DESYREL) tablet 50 mg  50 mg Oral QHS PRN Karie Fetch, MD   50 mg at 10/10/22 2049   vitamin D3 (CHOLECALCIFEROL) tablet 1,000 Units  1,000 Units Oral Daily Massengill, Harrold Donath, MD   1,000 Units at 10/11/22 9024    Lab Results:  Results for orders placed or performed during the hospital encounter of 09/10/22 (from the past 48 hour(s))  CBC with Differential/Platelet     Status: Abnormal   Collection Time: 10/10/22  6:44 AM  Result Value Ref Range   WBC 4.0 4.0 - 10.5 K/uL   RBC 4.88 3.87 - 5.11 MIL/uL   Hemoglobin 12.6 12.0 - 15.0 g/dL   HCT  09.7 35.3 - 29.9 %   MCV 84.6 80.0 - 100.0 fL   MCH 25.8 (L) 26.0 - 34.0 pg   MCHC 30.5 30.0 - 36.0 g/dL   RDW 24.2 68.3 - 41.9 %   Platelets 241 150 - 400 K/uL   nRBC 0.0 0.0 - 0.2 %   Neutrophils Relative % 55 %   Neutro Abs 2.2 1.7 - 7.7 K/uL   Lymphocytes Relative 33 %   Lymphs Abs 1.3 0.7 - 4.0 K/uL   Monocytes Relative 6 %   Monocytes Absolute 0.3 0.1 - 1.0 K/uL   Eosinophils Relative 4 %  Eosinophils Absolute 0.2 0.0 - 0.5 K/uL   Basophils Relative 1 %   Basophils Absolute 0.0 0.0 - 0.1 K/uL   Immature Granulocytes 1 %   Abs Immature Granulocytes 0.02 0.00 - 0.07 K/uL    Comment: Performed at [facility], [place] [address], [place], [place] [address]    Blood Alcohol level:  Lab Results  Component Value Date   [facility] <10 10/16/2021   ETH <10 0000000    Metabolic Disorder Labs: Lab Results  Component Value Date   HGBA1C 5.2 05/18/2022   MPG 102.54 05/18/2022   MPG 120 04/29/2021   Lab Results  Component Value Date   PROLACTIN 18.5 06/18/2022   PROLACTIN 58.3 (H) 06/06/2022   Lab Results  Component Value Date   CHOL 217 (H) 05/18/2022   TRIG 31 05/18/2022   HDL 71 05/18/2022   CHOLHDL 3.1 05/18/2022   VLDL 6 05/18/2022   LDLCALC 140 (H) 05/18/2022   LDLCALC 94 05/02/2021    Physical Findings: AIMS: Facial and Oral Movements Muscles of Facial Expression: None, normal Lips and Perioral Area: None, normal Jaw: None, normal Tongue: None, normal,Extremity Movements Upper (arms, wrists, hands, fingers): None, normal Lower (legs, knees, ankles, toes): None, normal, Trunk Movements Neck, shoulders, hips: None, normal, Overall Severity Severity of abnormal movements (highest score from questions above): None, normal Incapacitation due to abnormal movements: None, normal Patient's awareness of abnormal movements (rate only patient's report): No Awareness, Dental Status Current problems with teeth and/or dentures?: No Does patient usually  wear dentures?: No  CIWA:    COWS:     Musculoskeletal: Strength & Muscle Tone: within normal limits Gait & Station: normal Patient leans: N/A  Psychiatric Specialty Exam:   Appearance:  AAF, appearing stated age,  wearing appropriate to the situation hospital clothes, with fair grooming and hygiene. Normal level of alertness and appropriate facial expression.  Attitude/Behavior: calm, cooperative, engaging with appropriate eye contact.  Motor: WNL; dyskinesias not evident. Gait appears in full range.  Speech: spontaneous, clear, coherent, normal comprehension.  Mood: euthymic, " okay ".  Affect: restricted.  Thought process: patient appears coherent,  concrete but linear with questions  Thought content: expresses paranoid delusions; patient denies suicidal thoughts, denies homicidal thoughts.  Thought perception: patient denies auditory and visual hallucinations. Did not appear internally stimulated.  Cognition: patient is alert and oriented in self, place.  Insight: lacking  Judgement: impaired   Physical Exam: Physical Exam ROS Blood pressure 117/66, pulse 74, temperature 98.2 F (36.8 C), temperature source Oral, resp. rate 20, SpO2 100 %. There is no height or weight on file to calculate BMI.   Treatment Plan Summary: Daily contact with patient to assess and evaluate symptoms and progress in treatment and Medication management  Patient is a 31 year old female with the above-stated past psychiatric history who is seen in follow-up.  Chart reviewed. Patient discussed with nursing. Patient remains paranoid. No other concerns.  Diagnoses/ Active problems: Schizoaffective disorder, depressive type Insomnia (resolved)  GAD  Vitamin D deficiency - resolved with replacement    PLAN Safety and Monitoring: Voluntary admission to inpatient psychiatric unit for safety, stabilization and treatment Daily contact with patient to assess and evaluate symptoms and  progress in treatment Patient's discussed in multi-disciplinary team meeting Observation Level : q15 minute checks Vital signs: q12 hours Precautions: Safety   2. Medications #Schizoaffective disorder, bipolar type --continue clozapine 75 mg qhs - for psychosis. ANC stable 2.2 yesterday - ordered to recheck tomorrow. -- Continue  atropine drops 1% 1 drop tid for sialorrhea.  -- Continue Zoloft to 125 mg in the morning for MDD and GAD   #HTN -- Continue Norvasc 10 mg daily for hypertension   #Tachycardia - Continue atenolol 12.5 mg daily - for tachycardia 2/2 clozapine  - Continue to monitor, low threshold to obtain EKG    #Constipation -Continue Colace 100 mg 2 twice daily for constipation -Continue Miralax daily for constipation   #Nutritional needs  -- Continue Ensure TID for nutritional support   #Iron deficiency  -- Continue Ferrous Sulfate 325 mg daily for iron replacement   #Weight gain Ppx -- Continue Metformin 500 mg XL for weight gain prophylaxis   #Anxiety -- Continue Atarax 25 mg TID PRN for anxiety   No tobacco use -- Patient does not need nicotine replacement     Behavior Plan: -patient would benefit from a regular daily structure similar to that she might expect. A [facility] staff has discussed with patient that it is expected of all adults things like hygiene, clothes washing, keeping up their living area. Per RN patient requires prompting. Encourage patient to shower every other day, wash own clothing, and clean own room regularly for improved transition to independence.     Discharge Planning: Social work and case management to assist with discharge planning and identification of hospital follow-up needs prior to discharge Discharge Concerns: Need to establish a safety plan; Medication compliance and effectiveness Discharge Goals: d/c group home next Tuesday.   Total Time Spent in Direct Patient Care:  I personally spent 35 minutes on the unit in direct  patient care. The direct patient care time included face-to-face time with the patient, reviewing the patient's chart, communicating with other professionals, and coordinating care. Greater than 50% of this time was spent in counseling or coordinating care with the patient regarding goals of hospitalization, psycho-education, and discharge planning needs.    Larita Fife, MD 10/11/2022, 10:37 AM

## 2022-10-11 NOTE — Progress Notes (Signed)
D- Patient alert and oriented. Patient affect/mood reported as "okay" signaling using hand motions. Denies SI, HI, AVH, and pain. Patient shakes her head no when asked if she had a goal for today. Patient rates her day 5/10, 10 being the greatest day. Patient refuses to verbally speak, answers questions by shaking her head signaling yes or no, and also using her fingers to rate her day. A- Scheduled medications administered to patient, per MD orders. Support and encouragement provided.  Routine safety checks conducted every 15 minutes.  Patient informed to notify staff with problems or concerns. R- No adverse drug reactions noted. Patient contracts for safety at this time. Patient compliant with medications and treatment plan. Patient receptive, calm, and cooperative. Patient interacts well with others on the unit.  Patient remains safe at this time.

## 2022-10-11 NOTE — [facility] Group Notes (Signed)
Adult Psychoeducational Group Note Date:  10/11/2022 Time:  0900-1000 Group Topic/Focus: PROGRESSIVE RELAXATION. A group where deep breathing is taught and tensing and relaxation muscle groups is used. Imagery is used as well.  Pts are asked to imagine 3 pillars that hold them up when they are not able to hold themselves up and to share that with the group.   Participation Level:  Active  Participation Quality:  Appropriate  Affect:  Appropriate  Cognitive:  Approprate  Insight: Improving  Engagement in Group:  Engaged  Modes of Intervention:  deep breathing, Imagery. Discussion  Additional Comments:  Rates her energy at a 4/10. States her counselors, music and spirituality hold her up.   : Cheryl Adams

## 2022-10-11 NOTE — Plan of Care (Signed)
  Problem: Education: Goal: Knowledge of [place] [facility] Education information/materials will improve Outcome: Progressing   Problem: Education: Goal: Mental status will improve Outcome: Progressing   Problem: Education: Goal: Verbalization of understanding the information provided will improve Outcome: Progressing   Problem: Activity: Goal: Sleeping patterns will improve Outcome: Progressing

## 2022-10-11 NOTE — Group Note (Signed)
[facility] LCSW Group Therapy Note  Date/Time:  10/11/2022  10:00AM-11:00AM  Type of Therapy and Topic:  Group Therapy:  Obstacles at Discharge  Participation Level:  Minimal   Description of Group: In this process group, members discussed their anticipated obstacles to wellness when they discharge.  Patients were asked to share what their goal is at discharge.  We talked in detail about the importance of setting boundaries in our lives and how to set such boundaries.  It was discussed how sometimes we set boundaries with others and sometimes we set those boundaries with ourselves.  We then listened to different songs that dealt with addiction, depression, and anxiety.  The group discussed how they could relate to each song and how each could help them to stay focused on their wellness.    Therapeutic Goals: Patients will determine one major goal that they wish to pursue at discharge, in terms of remaining well Patients will think about and acknowledge the obstacles they think they will face at hospital discharge Patients will be able to realize that they are not alone and others are actually facing similar obstacles Patients will be introduced to ways in which to set boundaries in a healthy, productive fashion Patients will identify how music can help or harm their recovery efforts Patients will explore the use of music as a coping skill  Summary of Patient Progress:  At the beginning of group, patient shared that her goal at discharge is to continue her medicine and find the time to exercise, while her obstacles at discharge will likely be the weather.  Her reaction to the music included none at all.  Therapeutic Modalities: Activity Processing   Ambrose Mantle, LCSW

## 2022-10-11 NOTE — Progress Notes (Signed)
Adult Psychoeducational Group Note  Date:  10/11/2022 Time:  9:37 PM  Group Topic/Focus:  Wrap-Up Group:   The focus of this group is to help patients review their daily goal of treatment and discuss progress on daily workbooks.  Participation Level:  Minimal  Participation Quality:  Drowsy  Affect:  Lethargic  Cognitive:  Lacking  Insight: Lacking  Engagement in Group:  Limited  Modes of Intervention:  Discussion, Socialization, and Support  Additional Comments:   Pt attended the Wrap Up group with minimal participation. Pt denied SI/HI/AVH and pain. Pt used non verbal communication, held up five fingers when rating her day a 5/10. Pt shook her head from side to side when asked about current goals and coping skills confirming  that she did not set any goals or use any coping skills today. Pt slouched over and nodded off several times during the group. Pt assured Clinical research associate that she was okay, just tired. Pt remained in the group and was encouraged to try to be alert and attentive.   Edmund Hilda Shahed Yeoman 10/11/2022, 9:37 PM

## 2022-10-11 NOTE — Progress Notes (Signed)
  Patient denies SI, HI and AVH this shift. Patient has been engaged appropriately on the unit and has exhibited no behavioral dyscontrol.    Assess patient for safety, offer medications as prescribed, engage patient in 1:1 staff talks.    Patient able to contract for safety. Continue to monitor as planned.

## 2022-10-12 ENCOUNTER — Encounter (HOSPITAL_COMMUNITY): Payer: Self-pay

## 2022-10-12 MED ORDER — SERTRALINE HCL 50 MG PO TABS
150.0000 mg | ORAL_TABLET | Freq: Every day | ORAL | Status: DC
  Administered 2022-10-13: 150 mg via ORAL
  Filled 2022-10-12 (×3): qty 1

## 2022-10-12 MED ORDER — CLOZAPINE 100 MG PO TABS
100.0000 mg | ORAL_TABLET | Freq: Every day | ORAL | Status: DC
  Administered 2022-10-12: 100 mg via ORAL
  Filled 2022-10-12: qty 1
  Filled 2022-10-12 (×2): qty 7
  Filled 2022-10-12: qty 1

## 2022-10-12 NOTE — Group Note (Signed)
Recreation Therapy Group Note   Group Topic:Coping Skills  Group Date: 10/12/2022 Start Time: 1000 End Time: 1035 Facilitators: Benoit Meech-McCall, LRT,CTRS Location: [address] Dayroom   Goal Area(s) Addresses:  Patient will identify positive coping skills techniques. Patient will identify benefits of using positive coping skills post d/c.  Group Description:  Mind Map.  Patient was provided a blank template of a diagram with 32 blank boxes in a tiered system, branching from the center (similar to a bubble chart). LRT directed patients to label the middle of the diagram "Coping Skills".  LRT and patients were to come up with 8 different sources in which coping skills can be used. Patients were directed to record their coping skills in the 2nd tier boxes closest to the center. Patients were encouraged to share ideas with the larger group and LRT would write patient answers on the board.    Affect/Mood: Flat and Drowsy   Participation Level: Minimal   Participation Quality: Independent   Behavior: Reserved and Withdrawn   Speech/Thought Process: N/A   Insight: N/A   Judgement: N/A   Modes of Intervention: Worksheet   Patient Response to Interventions:  Disengaged   Education Outcome:  Acknowledges education and In group clarification offered    Clinical Observations/Individualized Feedback: Pt was quiet and withdrawn.  Pt sat with head against the wall.  Pt was observed taking notes at points during group.    Plan: Continue to engage patient in RT group sessions 2-3x/week.   Cheryl Adams, LRT,CTRS 10/12/2022 12:18 PM

## 2022-10-12 NOTE — Progress Notes (Signed)
Adult Psychoeducational Group Note  Date:  10/12/2022 Time:  8:53 PM  Group Topic/Focus:  Wrap-Up Group:   The focus of this group is to help patients review their daily goal of treatment and discuss progress on daily workbooks.  Participation Level:  Active  Participation Quality:  Appropriate  Affect:  Appropriate  Cognitive:  Appropriate  Insight: Appropriate  Engagement in Group:  Engaged  Modes of Intervention:  Discussion, Rapport Building, and Support  Additional Comments:   Pt attended and participated in the [place] group. Pt denied SI/HI/AVH and pain. Pt rated her day 5/10. Pt reports she met her goal of exercising today. Pt verbally praised for goal achievement. Pt shared that reading, walking, and going outside have helped to improve daily coping.   [name] Xavius Spadafore 10/12/2022, 8:53 PM

## 2022-10-12 NOTE — [facility] Counselor (Signed)
CSW contacted the [name] Legal Guardian, Lebron Conners, who states that she received the medical records for the Pt on Friday /10/09/2022.  She states that she is not in the office today and cannot get the paperwork faxed over to the group home.  She asked the CSW to fax the medical records to the group home at [phone].  CSW sent this fax on 10/12/2022 at 9:36am.  Mrs. Dareen Piano also states that she has contacted the group home to inquire about a move in date.  She states that she has not received a confirmation for the time and date but states that she is asking that the group home accept the Pt and pick her up from the hospital by 10/13/2022.  CSW will provide further details on this situation as they are received.

## 2022-10-12 NOTE — Progress Notes (Signed)
   10/12/22 1224  Psych Admission Type (Psych Patients Only)  Admission Status Voluntary  Psychosocial Assessment  Patient Complaints None  Eye Contact Brief  Facial Expression Flat  Affect Sad  Speech Soft  Interaction Poor  Motor Activity Slow  Appearance/Hygiene Unremarkable  Behavior Characteristics Cooperative  Mood Sad  Thought Process  Coherency WDL  Content WDL  Delusions None reported or observed  Perception WDL  Hallucination None reported or observed  Judgment Limited  Confusion None  Danger to Self  Current suicidal ideation? Denies  Self-Injurious Behavior No self-injurious ideation or behavior indicators observed or expressed   Agreement Not to Harm Self Yes  Description of Agreement verbal  Danger to Others  Danger to Others None reported or observed

## 2022-10-12 NOTE — Progress Notes (Signed)
Patient compliant with medications denies SI/HI/A/VH and verbally contracted for safety. Q15 minutes safety checks ongoing Patient remains safe.

## 2022-10-12 NOTE — Plan of Care (Signed)
  Problem: Coping: Goal: Ability to verbalize frustrations and anger appropriately will improve Outcome: Progressing   Problem: Coping: Goal: Ability to demonstrate self-control will improve Outcome: Progressing   Problem: Physical Regulation: Goal: Ability to maintain clinical measurements within normal limits will improve Outcome: Progressing

## 2022-10-12 NOTE — Progress Notes (Signed)
[facility] [address] MD Resident Progress Note  10/12/2022 6:32 AM Cheryl Adams  MRN:  [id number] Principal Problem: Schizoaffective disorder ([place]) Diagnosis: Principal Problem:   Schizoaffective disorder ([place]) Active Problems:   GAD (generalized anxiety disorder)   Vitamin D deficiency   Sialorrhea  Reason for admission   Cheryl Adams is a 31 year old African-American female with prior psychiatric diagnoses of schizoaffective d/o depressive type and GAD who initially presented to the [facility] health urgent care [facility]) accompanied by her mother with complaints of paranoia. She was previously admitted to [facility] from 6/20-10/10/2022 and treated for schizoaffective disorder, depressive type with multiple medication trials (Geodon, haldol, caplyta, thorazine, Zyprexa, Risperdal) and ultimately had partial response to clozapine. DSS was granted legal guardianship. On 09/10/2022, pt was d/c for competency evaluation per court. On 09/10/22, patient was readmitted to the psych unit after the evaluation. (admitted on 09/10/2022, total  LOS: 32 days )  Chart review from last 24 hours   The patient's chart was reviewed and nursing notes were reviewed. The patient's case was discussed in multidisciplinary team meeting.  - Overnight events per chart review: started to use nonverbal communication, minimal speaking with staff. Minimal participation in group.  - Patient took all scheduled meds  - Patient took PRN trazodone (2047) meds   Information obtained during interview   The patient was seen and evaluated on the unit. On assessment today, the patient initially starts off the interview with minimal speech. She starts talking (minimally) as the interview continues. The patient reports no physical complaints. She denies drooling, last BM yesterday. When asked about her mood, she holds up her hand as 5/10. She reports more energy than yesterday. She reports she slept well and ate dinner and breakfast this AM.    She reports just a little bit of anxiety "I just feel anxious." Reports coping skill include meditating, "sometimes it helps, sometimes it doesn't." It has not been helping recently. She reports listening to music helps. She is still reporting paranoia which has increased since yesterday. "Maybe" doctors will hurt her. "Probably" nurses will harm her. People outside might hurt her but she is unable to specify who. She denies AVH, thought insertion, withdrawal, broadcasting. She denies SI/HI.     Review of Systems  Respiratory:  Negative for shortness of breath.   Cardiovascular:  Negative for chest pain.  Gastrointestinal:  Negative for abdominal pain, constipation, diarrhea, nausea and vomiting.  Neurological:  Negative for headaches.   Targeted review of symptoms, specific for clozapine: Malaise/Sedation: denies Chest pain: denies Shortness of breath: denies Exertional capacity: denies Tachycardia: HR 73, no tachycardia on chart  Cough: denies Sore Throat: denies Fever: Afebrile on chart  Orthostatic hypotension (dizziness with standing): denies Hypersalivation: denies Constipation: denies Nausea: denies Nocturnal enuresis: denies  Symptoms of GERD -Acid reflux: denies -Burning sensation in chest, usually after eating: denies -Trouble swallowing: denies -Sensation of lump in throat: denies -Regurgitation (backwash) of food or sour liquid: denies  Objective   Blood pressure 102/66, pulse 73, temperature 98.3 F (36.8 C), temperature source Oral, resp. rate 16, SpO2 100 %. There is no height or weight on file to calculate BMI. Sleep: 8 hours  Current Medications: Current Facility-Administered Medications  Medication Dose Route Frequency Provider Last Rate Last Admin   acetaminophen (TYLENOL) tablet 650 mg  650 mg Oral Q6H PRN Massengill, Nathan, MD       alum & mag hydroxide-simeth (MAALOX/MYLANTA) 200-200-20 MG/5ML suspension 30 mL  30 mL Oral Q4H PRN Massengill, Ovid Curd,  MD       amLODipine (NORVASC) tablet 10 mg  10 mg Oral Daily Massengill, Nathan, MD   10 mg at 10/11/22 [id number]   antiseptic oral rinse (BIOTENE) solution 15 mL  15 mL Mouth Rinse 5 X Daily PRN Massengill, Harrold DonathNathan, MD       atenolol (TENORMIN) tablet 12.5 mg  12.5 mg Oral Daily Massengill, Nathan, MD   12.5 mg at 10/11/22 0723   atropine 1 % ophthalmic solution 1 drop  1 drop Sublingual TID Phineas InchesMassengill, Nathan, MD   1 drop at 10/11/22 1631   cloZAPine (CLOZARIL) tablet 75 mg  75 mg Oral QHS Karie Fetchhien, Stepfon Rawles, MD   75 mg at 10/11/22 2046   docusate sodium (COLACE) capsule 100 mg  100 mg Oral BID Massengill, Harrold DonathNathan, MD   100 mg at 10/11/22 1629   ferrous sulfate tablet 325 mg  325 mg Oral Daily Massengill, Harrold DonathNathan, MD   325 mg at 10/11/22 [id number]   magnesium hydroxide (MILK OF MAGNESIA) suspension 30 mL  30 mL Oral Daily PRN Massengill, Harrold DonathNathan, MD       metFORMIN (GLUCOPHAGE-XR) 24 hr tablet 500 mg  500 mg Oral Q breakfast Massengill, Nathan, MD   500 mg at 10/11/22 0725   polyethylene glycol (MIRALAX / GLYCOLAX) packet 17 g  17 g Oral Daily Massengill, Harrold DonathNathan, MD   17 g at 10/11/22 0725   sertraline (ZOLOFT) tablet 125 mg  125 mg Oral Daily Massengill, Harrold DonathNathan, MD   125 mg at 10/11/22 [id number]   traZODone (DESYREL) tablet 50 mg  50 mg Oral QHS PRN Karie Fetchhien, Khyran Riera, MD   50 mg at 10/11/22 2047   vitamin D3 (CHOLECALCIFEROL) tablet 1,000 Units  1,000 Units Oral Daily Phineas InchesMassengill, Nathan, MD   1,000 Units at 10/11/22 0722   Labs: ANC 3.5   Physical Exam Constitutional:      Appearance: the patient is not toxic-appearing.  Pulmonary:     Effort: Pulmonary effort is normal.  Neurological:     General: No focal deficit present.     Mental Status: the patient is alert and oriented to person, place, and time.  AIMS:  Facial and Oral Movements Muscles of Facial Expression: None, normal Lips and Perioral Area: None, normal Jaw: None, normal Tongue: None, normal,Extremity Movements Upper (arms, wrists, hands,  fingers): None, normal Lower (legs, knees, ankles, toes): None, normal Trunk Movements: None, normal  Neck, shoulders, hips: None, normal Overall Severity Severity of abnormal movements (highest score from questions above): None, normal Incapacitation due to abnormal movements: None, normal Patient's awareness of abnormal movements (rate only patient's report): No Awareness, Dental Status Current problems with teeth and/or dentures?: No Does patient usually wear dentures?: No   No stiffness, cogwheeling, or tremors noted on exam.   Mental Status Exam   Appearance and Grooming: Patient is casually dressed in pajama pants and green top, wearing her glasses . The patient has no noticeable scent or odor. Motor activity: The patient's movement speed was normal; her gait was not observed during encounter. There was no notable abnormal facial movements and no notable abnormal extremity movements. Behavior: The patient appears in no acute distress, and during the interview, was calm, refusing to talk, focused, and required minimal redirection. She was refusing to talk initially and was only nodding head yes/no as well as holding up fingers in answer to questions but as interview progressed she began to speak minimally. she was able to follow commands and compliant to requests and made minimal eye  contact. The patient did not appear internally or externally preoccupied. Attitude: Patient's attitude towards the interviewer was cooperative and guarded, as evidenced by initially minimally speaking but opened up more as interview progressed . Speech: The patient's speech was clear, fluent, with good articulation, and with appropriately placed inflections. The volume of her speech was soft and sparse in quantity. The rate was normal with a normal rhythm. Responses were delayed. There were no abnormal patterns in speech. Mood: "Just a little bit of anxiety" Affect: Patient's affect is euthymic and  anxious with restricted range and even fluctuations; her affect is congruent with her stated mood. ------------------------------------------------------------------------------------------------------------------------- Thought Content The patient experiences no hallucinations. The patient describes  paranoid delusions, thoughts that people in and outside of the hospital will harm her ; she denies thought insertion, denies thought withdrawal, denies thought interruption, and denies thought broadcasting. Patient at the time of interview denies active suicidal intent and denies passive suicidal ideation; she denies homicidal intent. Thought Process The patient's thought process is linear and is concrete, with poverty of thought . Insight The patient at the time of interview demonstrates poor insight, as evidenced by lacking understanding of mental health condition/s. Judgement The patient over the past 24 hours demonstrates poor judgement, as evidenced by minimally engaging with staff / other patients.  Assets: Physical Health; Resilience  Treatment Plan Summary: Daily contact with patient to assess and evaluate symptoms and progress in treatment and Medication management Summary   Cheryl Adams is a 31 year old African-American female with prior psychiatric diagnoses of schizoaffective d/o depressive type and GAD who initially presented to the [employer] health urgent care [facility]) accompanied by her mother with complaints of paranoia. She was previously admitted to [facility] from 6/20-10/10/2022 and treated for schizoaffective disorder, depressive type with multiple medication trials (Geodon, haldol, caplyta, thorazine, Zyprexa, Risperdal) and ultimately had partial response to clozapine. DSS was granted legal guardianship. On 09/10/2022, pt was d/c for competency evaluation per court. On 09/10/22, patient was readmitted to the psych unit after the evaluation. She is being treated for  continued psychosis, MDD and GAD with medications and therapy.     Diagnoses / Active Problems: Schizoaffective disorder ([facility]) Principal Problem:   Schizoaffective disorder ([facility]) Active Problems:   GAD (generalized anxiety disorder)   Vitamin D deficiency   Sialorrhea  Plan  Safety and Monitoring: -- Voluntary admission to inpatient psychiatric unit for safety, stabilization and treatment -- Daily contact with patient to assess and evaluate symptoms and progress in treatment -- Patient's case to be discussed in multi-disciplinary team meeting -- Observation Level : q15 minute checks -- Vital signs:  q12 hours -- Precautions: suicide, elopement, and assault  2. Medications:  #Schizoaffective disorder, bipolar type Given continued paranoia, we will increase her Clozaril to 100 mg tonight. -- INCREASE clozapine 100 mg qhs - for psychosis. Repeat ANC increased to 3.5  -- Continue atropine drops 1% 1 drop tid for sialorrhea.  -- Continue Zoloft to 125 mg in the morning for MDD and GAD   #HTN -- Continue Norvasc 10 mg daily for hypertension   #Tachycardia - Continue atenolol 12.5 mg daily - for tachycardia 2/2 clozapine  - Continue to monitor, low threshold to obtain EKG    #Constipation -Continue Colace 100 mg 2 twice daily for constipation -Continue Miralax daily for constipation   #Nutritional needs  -- Continue Ensure TID for nutritional support   #Iron deficiency  -- Continue Ferrous Sulfate 325 mg daily for iron replacement   #  Weight gain Ppx -- Continue Metformin 500 mg XL for weight gain prophylaxis   #Anxiety -- Continue Atarax 25 mg TID PRN for anxiety   No tobacco use -- Patient does not need nicotine replacement  The risks/benefits/side-effects/alternatives to the above medication were discussed in detail with the patient and time was given for questions. The patient consents to medication trial. FDA black box warnings, if present, were discussed.  The  patient is agreeable with the medication plan, as above. We will monitor the patient's response to pharmacologic treatment, and adjust medications as necessary.  3. Routine and other pertinent labs: EKG monitoring: QTc: 441 (11/7) Lab Results:     Latest Ref Rng & Units 10/11/2022    6:08 PM 10/10/2022    6:44 AM 10/08/2022    6:30 AM  CBC  WBC 4.0 - 10.5 K/uL 5.6  4.0  3.9   Hemoglobin 12.0 - 15.0 g/dL 12.8  12.6  12.3   Hematocrit 36.0 - 46.0 % 41.1  41.3  40.6   Platelets 150 - 400 K/uL 265  241  243       Latest Ref Rng & Units 10/07/2022    6:38 AM 06/18/2022    6:27 PM 05/31/2022    6:42 AM  BMP  Glucose 70 - 99 mg/dL 89  117  101   BUN 6 - 20 mg/dL 12  11  6    Creatinine 0.44 - 1.00 mg/dL 0.70  0.70  0.82   Sodium 135 - 145 mmol/L 142  141  137   Potassium 3.5 - 5.1 mmol/L 3.5  3.5  3.9   Chloride 98 - 111 mmol/L 109  109  103   CO2 22 - 32 mmol/L 26  26  28    Calcium 8.9 - 10.3 mg/dL 8.9  9.3  8.9    Blood Alcohol level:  Lab Results  Component Value Date   ETH <10 10/16/2021   ETH <10 04/24/2021   Prolactin: Lab Results  Component Value Date   PROLACTIN 18.5 06/18/2022   PROLACTIN 58.3 (H) 06/06/2022   Lipid Panel: Lab Results  Component Value Date   CHOL 217 (H) 05/18/2022   TRIG 31 05/18/2022   HDL 71 05/18/2022   CHOLHDL 3.1 05/18/2022   VLDL 6 05/18/2022   LDLCALC 140 (H) 05/18/2022   LDLCALC 94 05/02/2021   HbgA1c: Lab Results  Component Value Date   HGBA1C 5.2 05/18/2022   TSH: Lab Results  Component Value Date   TSH 0.964 06/18/2022    4. Group Therapy: -- Encouraged patient to participate in unit milieu and in scheduled group therapies  -- Short Term Goals: Ability to identify changes in lifestyle to reduce recurrence of condition will improve, Ability to verbalize feelings will improve, Ability to disclose and discuss suicidal ideas, Ability to demonstrate self-control will improve, Ability to identify and develop effective coping  behaviors will improve, Ability to maintain clinical measurements within normal limits will improve, Compliance with prescribed medications will improve, and Ability to identify triggers associated with substance abuse/mental health issues will improve  -- Long Term Goals: Improvement in symptoms so as ready for discharge -- Patient is encouraged to participate in group therapy while admitted to the psychiatric unit. -- We will address other chronic and acute stressors, which contributed to the patient's Schizoaffective disorder ([place]) in order to reduce the risk of self-harm at discharge.  5. Discharge Planning:  -- Social work and case management to assist with discharge planning and identification  of hospital follow-up needs prior to discharge -- Estimated LOS: 5-7 days -- Discharge Concerns: Need to establish a safety plan; Medication compliance and effectiveness. SW will call group home re discharge and availability.  -- Discharge Goals: Return home with outpatient referrals for mental health follow-up including medication management/psychotherapy.   I certify that inpatient services furnished can reasonably be expected to improve the patient's condition.   I discussed my assessment, planned testing and intervention for the patient with Dr. Caswell Corwin who agrees with my formulated course of action.  Rolanda Lundborg, MD, PGY-1 10/12/2022, 6:32 AM

## 2022-10-12 NOTE — Group Note (Signed)
ype of Therapy and Topic:  Group Therapy:  Cycle of Depression   Participation Level:  Active    Description of Group:  Patients in this group were introduced to the idea of the cycle of depression. Patients explored how stressors can trigger thoughts, feels and physical symptoms that can make you behave in ways that increase symptoms of depression.  Patient identified specific stressors that have triggered depression and explored thoughts that they have about themselves that may not always be true.   Patients encouraged to come up with positive changes and interventions put in place to stop cycle of depression. Patients also participated in discussion about benefits of being able to identify stressors, thoughts, feels and behavioral responses when depressed.      Therapeutic Goals:               1)  To discuss the positive and negative impacts of depressive feels             2)  identify signs and symptoms of depression             3)  discuss alternative behaviors to stop cycle of depression             4)  offer mutual support to others regarding depression             5)  Developing plans for ways to manage specific stressors upon discharge               Summary of Patient Progress:  Patient participated appropriately in group and had fair to good insight into group topic.  Patient shared that speaking to her counselor helps her intervene in the cycle of depression.  Patient was open to feedback from other peers and provided feedback to her peers.    Therapeutic Modalities:   Motivational Interviewing Brief Solution-Focused Therapy    Jamielee Mchale, LCSW, LCAS Clincal Social Worker  [facility]

## 2022-10-12 NOTE — Progress Notes (Signed)
Sleep: 8 hours

## 2022-10-12 NOTE — BH IP Treatment Plan (Signed)
Interdisciplinary Treatment and Diagnostic Plan Update  10/12/2022 Time of Session: 8:30am Cheryl Adams MRN: [id number]  Principal Diagnosis: Schizoaffective disorder [facility])  Secondary Diagnoses: Principal Problem:   Schizoaffective disorder ([place]) Active Problems:   GAD (generalized anxiety disorder)   Vitamin D deficiency   Sialorrhea   Current Medications:  Current Facility-Administered Medications  Medication Dose Route Frequency Provider Last Rate Last Admin   acetaminophen (TYLENOL) tablet 650 mg  650 mg Oral Q6H PRN Massengill, Ovid Curd, MD       alum & mag hydroxide-simeth (MAALOX/MYLANTA) 200-200-20 MG/5ML suspension 30 mL  30 mL Oral Q4H PRN Massengill, Nathan, MD       amLODipine (NORVASC) tablet 10 mg  10 mg Oral Daily Massengill, Nathan, MD   10 mg at 10/12/22 0801   antiseptic oral rinse (BIOTENE) solution 15 mL  15 mL Mouth Rinse 5 X Daily PRN Massengill, Ovid Curd, MD       atenolol (TENORMIN) tablet 12.5 mg  12.5 mg Oral Daily Massengill, Nathan, MD   12.5 mg at 10/12/22 0800   atropine 1 % ophthalmic solution 1 drop  1 drop Sublingual TID Massengill, Ovid Curd, MD   1 drop at 10/12/22 0801   cloZAPine (CLOZARIL) tablet 100 mg  100 mg Oral QHS Massengill, Nathan, MD       docusate sodium (COLACE) capsule 100 mg  100 mg Oral BID Massengill, Nathan, MD   100 mg at 10/12/22 0800   ferrous sulfate tablet 325 mg  325 mg Oral Daily Massengill, Ovid Curd, MD   325 mg at 10/12/22 0759   magnesium hydroxide (MILK OF MAGNESIA) suspension 30 mL  30 mL Oral Daily PRN Massengill, Ovid Curd, MD       metFORMIN (GLUCOPHAGE-XR) 24 hr tablet 500 mg  500 mg Oral Q breakfast Massengill, Nathan, MD   500 mg at 10/12/22 0801   polyethylene glycol (MIRALAX / GLYCOLAX) packet 17 g  17 g Oral Daily Massengill, Nathan, MD   17 g at 10/12/22 0800   sertraline (ZOLOFT) tablet 125 mg  125 mg Oral Daily Massengill, Ovid Curd, MD   125 mg at 10/12/22 0800   traZODone (DESYREL) tablet 50 mg  50 mg Oral QHS PRN Rolanda Lundborg, MD   50 mg at 10/11/22 2047   vitamin D3 (CHOLECALCIFEROL) tablet 1,000 Units  1,000 Units Oral Daily Massengill, Ovid Curd, MD   1,000 Units at 10/12/22 0759   PTA Medications: Medications Prior to Admission  Medication Sig Dispense Refill Last Dose   amLODipine (NORVASC) 10 MG tablet Take 1 tablet (10 mg total) by mouth daily.      antiseptic oral rinse (BIOTENE) LIQD 15 mLs by Mouth Rinse route 5 (five) times daily as needed for dry mouth.      atenolol (TENORMIN) 25 MG tablet Take 0.5 tablets (12.5 mg total) by mouth daily.      atropine 1 % ophthalmic solution Place 1 drop under the tongue 3 (three) times daily. 2 mL 12    cloZAPine (CLOZARIL) 200 MG tablet Take 1 tablet (200 mg total) by mouth at bedtime.      docusate sodium (COLACE) 100 MG capsule Take 1 capsule (100 mg total) by mouth 2 (two) times daily. 10 capsule 0    ferrous sulfate 325 (65 FE) MG tablet Take 325 mg by mouth daily.      metFORMIN (GLUCOPHAGE-XR) 500 MG 24 hr tablet Take 1 tablet (500 mg total) by mouth daily with breakfast.      methylphenidate (RITALIN) 10  MG tablet Take 1 tablet (10 mg total) by mouth daily.  0    polyethylene glycol (MIRALAX / GLYCOLAX) 17 g packet Take 17 g by mouth daily. 14 each 0    sertraline (ZOLOFT) 100 MG tablet Take 1 tablet (100 mg total) by mouth daily.      vitamin D3 (CHOLECALCIFEROL) 25 MCG tablet Take 1 tablet (1,000 Units total) by mouth daily. 60 tablet      Patient Stressors:    Patient Strengths:    Treatment Modalities: Medication Management, Group therapy, Case management,  1 to 1 session with clinician, Psychoeducation, Recreational therapy.   Physician Treatment Plan for Primary Diagnosis: Schizoaffective disorder ([place]) Long Term Goal(s): Improvement in symptoms so as ready for discharge   Short Term Goals: Ability to identify changes in lifestyle to reduce recurrence of condition will improve Ability to verbalize feelings will improve Ability to  disclose and discuss suicidal ideas Ability to demonstrate self-control will improve Ability to identify and develop effective coping behaviors will improve Ability to maintain clinical measurements within normal limits will improve Compliance with prescribed medications will improve Ability to identify triggers associated with substance abuse/mental health issues will improve  Medication Management: Evaluate patient's response, side effects, and tolerance of medication regimen.  Therapeutic Interventions: 1 to 1 sessions, Unit Group sessions and Medication administration.  Evaluation of Outcomes: Met  Physician Treatment Plan for Secondary Diagnosis: Principal Problem:   Schizoaffective disorder ([place]) Active Problems:   GAD (generalized anxiety disorder)   Vitamin D deficiency   Sialorrhea  Long Term Goal(s): Improvement in symptoms so as ready for discharge   Short Term Goals: Ability to identify changes in lifestyle to reduce recurrence of condition will improve Ability to verbalize feelings will improve Ability to disclose and discuss suicidal ideas Ability to demonstrate self-control will improve Ability to identify and develop effective coping behaviors will improve Ability to maintain clinical measurements within normal limits will improve Compliance with prescribed medications will improve Ability to identify triggers associated with substance abuse/mental health issues will improve     Medication Management: Evaluate patient's response, side effects, and tolerance of medication regimen.  Therapeutic Interventions: 1 to 1 sessions, Unit Group sessions and Medication administration.  Evaluation of Outcomes: Met   RN Treatment Plan for Primary Diagnosis: Schizoaffective disorder ([place]) Long Term Goal(s): Knowledge of disease and therapeutic regimen to maintain health will improve  Short Term Goals: Ability to remain free from injury will improve, Ability to verbalize  frustration and anger appropriately will improve, Ability to demonstrate self-control, Ability to participate in decision making will improve, Ability to verbalize feelings will improve, Ability to disclose and discuss suicidal ideas, Ability to identify and develop effective coping behaviors will improve, and Compliance with prescribed medications will improve  Medication Management: RN will administer medications as ordered by provider, will assess and evaluate patient's response and provide education to patient for prescribed medication. RN will report any adverse and/or side effects to prescribing provider.  Therapeutic Interventions: 1 on 1 counseling sessions, Psychoeducation, Medication administration, Evaluate responses to treatment, Monitor vital signs and CBGs as ordered, Perform/monitor CIWA, COWS, AIMS and Fall Risk screenings as ordered, Perform wound care treatments as ordered.  Evaluation of Outcomes: Met   LCSW Treatment Plan for Primary Diagnosis: Schizoaffective disorder ([place]) Long Term Goal(s): Safe transition to appropriate next level of care at discharge, Engage patient in therapeutic group addressing interpersonal concerns.  Short Term Goals: Engage patient in aftercare planning with referrals and resources, Increase social  support, Increase ability to appropriately verbalize feelings, Increase emotional regulation, Facilitate acceptance of mental health diagnosis and concerns, Facilitate patient progression through stages of change regarding substance use diagnoses and concerns, Identify triggers associated with mental health/substance abuse issues, and Increase skills for wellness and recovery  Therapeutic Interventions: Assess for all discharge needs, 1 to 1 time with Social worker, Explore available resources and support systems, Assess for adequacy in community support network, Educate family and significant other(s) on suicide prevention, Complete Psychosocial Assessment,  Interpersonal group therapy.  Evaluation of Outcomes: Met   Progress in Treatment: Attending groups: Yes. Participating in groups: Yes. Taking medication as prescribed: Yes. Toleration medication: Yes. Family/Significant other contact made: Yes, individual(s) contacted:  Guardian and Mother Patient understands diagnosis: Yes. Discussing patient identified problems/goals with staff: Yes. Medical problems stabilized or resolved: Yes. Denies suicidal/homicidal ideation: Yes. Issues/concerns per patient self-inventory: No.   New problem(s) identified: No, Describe:  none reported   New Short Term/Long Term Goal(s):   medication stabilization, elimination of SI thoughts, development of comprehensive mental wellness plan.    Patient Goals:   No additional goals identified at this time. Patient to continue to work towards original goals identified in initial treatment team meeting. CSW will remain available to patient should they voice additional treatment goals.   Discharge Plan or Barriers: Patient continues to wait for group home placement and adequate supervsion upon discharge.    Reason for Continuation of Hospitalization: Anxiety Delusions  Depression Medication stabilization  Estimated Length of Stay: 1-3 days  Last 3 [place] Suicide Severity Risk Score: Flowsheet Row Admission (Discharged) from 05/19/2022 in [facility] ED from 05/18/2022 in [facility] Office Visit from 04/14/2022 in [facility] No Risk High Risk No Risk       Last PHQ 2/9 Scores:    04/14/2022   10:46 AM 02/24/2022   11:12 AM 01/13/2022    9:14 AM  Depression screen PHQ 2/9  Decreased Interest 0 0 0  Down, Depressed, Hopeless 0 0 0  PHQ - 2 Score 0 0 0    Scribe for Treatment Team: Zachery Conch, LCSW 10/12/2022 8:57 AM

## 2022-10-12 NOTE — ED Notes (Signed)
   10/11/22 2000  Psych Admission Type (Psych Patients Only)  Admission Status Voluntary  Psychosocial Assessment  Patient Complaints None  Eye Contact Avoids  Facial Expression Sad  Affect Depressed;Sad  Speech Slow  Interaction Poor  Motor Activity Slow  Appearance/Hygiene Unremarkable  Behavior Characteristics Cooperative;Calm  Mood Depressed;Sad  Thought Process  Coherency WDL  Content WDL  Delusions None reported or observed  Perception WDL  Hallucination None reported or observed  Judgment Limited  Confusion None  Danger to Self  Current suicidal ideation? Denies  Self-Injurious Behavior No self-injurious ideation or behavior indicators observed or expressed   Agreement Not to Harm Self Yes  Description of Agreement Verbal  Danger to Others  Danger to Others None reported or observed

## 2022-10-13 ENCOUNTER — Other Ambulatory Visit (HOSPITAL_COMMUNITY): Payer: Self-pay

## 2022-10-13 LAB — CBC WITH DIFFERENTIAL/PLATELET
Abs Immature Granulocytes: 0.02 K/uL (ref 0.00–0.07)
Basophils Absolute: 0 K/uL (ref 0.0–0.1)
Basophils Relative: 1 %
Eosinophils Absolute: 0.1 K/uL (ref 0.0–0.5)
Eosinophils Relative: 4 %
HCT: 40.2 % (ref 36.0–46.0)
Hemoglobin: 12.5 g/dL (ref 12.0–15.0)
Immature Granulocytes: 1 %
Lymphocytes Relative: 36 %
Lymphs Abs: 1.4 K/uL (ref 0.7–4.0)
MCH: 25.9 pg — ABNORMAL LOW (ref 26.0–34.0)
MCHC: 31.1 g/dL (ref 30.0–36.0)
MCV: 83.2 fL (ref 80.0–100.0)
Monocytes Absolute: 0.2 K/uL (ref 0.1–1.0)
Monocytes Relative: 6 %
Neutro Abs: 2 K/uL (ref 1.7–7.7)
Neutrophils Relative %: 52 %
Platelets: 226 K/uL (ref 150–400)
RBC: 4.83 MIL/uL (ref 3.87–5.11)
RDW: 13.8 % (ref 11.5–15.5)
WBC: 3.7 K/uL — ABNORMAL LOW (ref 4.0–10.5)
nRBC: 0 % (ref 0.0–0.2)

## 2022-10-13 MED ORDER — ATENOLOL 25 MG PO TABS: 12.5000 mg | ORAL_TABLET | Freq: Every day | ORAL | 0 refills | Status: AC

## 2022-10-13 MED ORDER — AMLODIPINE BESYLATE 10 MG PO TABS
10.0000 mg | ORAL_TABLET | Freq: Every day | ORAL | 0 refills | Status: AC
  Filled 2022-10-13: qty 30, 30d supply, fill #0

## 2022-10-13 MED ORDER — TRAZODONE HCL 50 MG PO TABS
50.0000 mg | ORAL_TABLET | Freq: Every evening | ORAL | 0 refills | Status: AC | PRN
  Filled 2022-10-13: qty 30, 30d supply, fill #0

## 2022-10-13 MED ORDER — BIOTENE DRY MOUTH MT LIQD: 15.0000 mL | Freq: Every day | OROMUCOSAL | Status: AC | PRN

## 2022-10-13 MED ORDER — METFORMIN HCL ER 500 MG PO TB24
500.0000 mg | ORAL_TABLET | Freq: Every day | ORAL | 0 refills | Status: AC
  Filled 2022-10-13: qty 30, 30d supply, fill #0

## 2022-10-13 MED ORDER — SERTRALINE HCL 50 MG PO TABS
150.0000 mg | ORAL_TABLET | Freq: Every day | ORAL | 0 refills | Status: AC
  Filled 2022-10-13: qty 90, 30d supply, fill #0

## 2022-10-13 MED ORDER — ATROPINE SULFATE 1 % OP SOLN: 1.0000 [drp] | Freq: Three times a day (TID) | OPHTHALMIC | 0 refills | Status: AC

## 2022-10-13 MED ORDER — CLOZAPINE 100 MG PO TABS: 100.0000 mg | ORAL_TABLET | Freq: Every day | ORAL | Status: AC

## 2022-10-13 MED ORDER — VITAMIN D3 25 MCG PO TABS: 1000.0000 [IU] | ORAL_TABLET | Freq: Every day | ORAL | 0 refills | Status: AC

## 2022-10-13 MED ORDER — ATENOLOL 25 MG PO TABS
12.5000 mg | ORAL_TABLET | Freq: Every day | ORAL | 0 refills | Status: AC
  Filled 2022-10-13: qty 15, 30d supply, fill #0

## 2022-10-13 MED ORDER — SERTRALINE HCL 50 MG PO TABS: 150.0000 mg | ORAL_TABLET | Freq: Every day | ORAL | 0 refills | Status: AC

## 2022-10-13 MED ORDER — DOCUSATE SODIUM 100 MG PO CAPS: 100.0000 mg | ORAL_CAPSULE | Freq: Two times a day (BID) | ORAL | 0 refills | Status: AC

## 2022-10-13 MED ORDER — AMLODIPINE BESYLATE 10 MG PO TABS: 10.0000 mg | ORAL_TABLET | Freq: Every day | ORAL | 0 refills | Status: AC

## 2022-10-13 MED ORDER — FERROUS SULFATE 325 (65 FE) MG PO TABS: 325.0000 mg | ORAL_TABLET | Freq: Every day | ORAL | 0 refills | Status: AC

## 2022-10-13 MED ORDER — METFORMIN HCL ER 500 MG PO TB24: 500.0000 mg | ORAL_TABLET | Freq: Every day | ORAL | 0 refills | Status: AC

## 2022-10-13 MED ORDER — TRAZODONE HCL 50 MG PO TABS: 50.0000 mg | ORAL_TABLET | Freq: Every evening | ORAL | 0 refills | Status: AC | PRN

## 2022-10-13 MED ORDER — POLYETHYLENE GLYCOL 3350 17 G PO PACK: 17.0000 g | PACK | Freq: Every day | ORAL | 0 refills | Status: AC

## 2022-10-13 MED ORDER — ATROPINE SULFATE 1 % OP SOLN
1.0000 [drp] | Freq: Three times a day (TID) | OPHTHALMIC | 0 refills | Status: AC
  Filled 2022-10-13: qty 2, 14d supply, fill #0

## 2022-10-13 NOTE — Progress Notes (Signed)
  [facility] Adult Case Management Discharge Plan :  Will you be returning to the same living situation after discharge:  No. [facility]  At discharge, do you have transportation home?: Yes,  DSS Legal Guardian  Do you have the ability to pay for your medications: Yes,  Medicaid, DSS   Release of information consent forms completed and in the chart;  Patient's signature needed at discharge.  Patient to Follow up at:  Follow-up Information     [facility] Follow up.   Why: A referral has been made for you to begin [employer].  This agency will contact you or your legal guardian to complete an intake to begin services. Contact information: [address] [place] [address] [phone]         [facility] Follow up.   Why: A referral has been made for you to begin services with this agency. This agency will contact you or your legal guardian to schedule an intake appointment. Contact information: Address: [address] [place], [place] [address]  Phone: [phone]        [facility]. Follow up on 10/13/2022.   Why: You are scheduled to attend this group home on 10/13/2022.  Please speak with your legal guardian for further questions or concerns. Contact information: Address: [address] [place], [place] [address]  Phone: [phone]        [facility] Follow up.   Why: Please use walk-in hours to begin therapy and psychiatry services. Contact information: Location: [address] [place], [place] [address]  Phone: [phone] Fax: [phone]                Next level of care provider has access to [facility] Link:no  Safety Planning and Suicide Prevention discussed: Yes,  with patient and DSS Legal Guardian      Has patient been referred to the Quitline?: N/A patient is not a smoker  Patient has been referred for addiction treatment: N/A  Aram Beecham,  LCSWA 10/13/2022, 11:36 AM

## 2022-10-13 NOTE — Progress Notes (Signed)
Patient ID: Cheryl Adams, female   DOB: [birth year], 31 y.o.   MRN: [id number]  Pt ambulatory, alert, and oriented X4 on and off the unit. Education, support, and encouragement provided. Discharge summary/AVS, prescriptions, medications, and follow up appointments reviewed with pt and a copy of the AVS was given to pt. Medications "next dose" was also reviewed with pt and marked/notated on pt's med list on AVS. Suicide safety plan completed, reviewed with this RN, given to the patient, and a copy was placed in the chart. Suicide prevention resources also provided. [name] belongings in locker #41 returned and belongings sheet signed. Pt denies SI/HI (plan and intention), and AVH. Pt denies any concerns at this time. Pt discharged to lobby.

## 2022-10-13 NOTE — Progress Notes (Signed)
  [facility] Adult Case Management Discharge Plan :  Will you be returning to the same living situation after discharge:  No. [facility]  At discharge, do you have transportation home?: Yes,  Lebron Conners, Legal Guardian  Do you have the ability to pay for your medications: Yes,  DSS/Medicaid Application   Release of information consent forms completed and in the chart;  Patient's signature needed at discharge.  Patient to Follow up at:  Follow-up Information     [facility] Follow up.   Why: A referral has been made for you to begin [employer].  This agency will contact you or your legal guardian to complete an intake to begin services. Contact information: [address] [place] [address] [phone]         [facility] Follow up.   Why: A referral has been made for you to begin services with this agency. This agency will contact you or your legal guardian to schedule an intake appointment. Contact information: Address: [address], [place], [place] [address]  Phone: [phone]        [facility]. Follow up on 10/13/2022.   Why: You are scheduled to attend this group home on 10/13/2022.  Please speak with your legal guardian for further questions or concerns. Contact information: Address: [address] [place], [place] [address]  Phone: [phone]                Next level of care provider has access to [facility] Link:no  Safety Planning and Suicide Prevention discussed: Yes,  with patient and Legal Guardian      Has patient been referred to the Quitline?: N/A patient is not a smoker  Patient has been referred for addiction treatment: N/A  Aram Beecham, LCSWA 10/13/2022, 9:37 AM

## 2022-10-13 NOTE — [facility] Suicide Risk Assessment (Signed)
[facility] Discharge Suicide Risk Assessment   Principal Problem: Schizoaffective disorder [facility]) Discharge Diagnoses: Principal Problem:   Schizoaffective disorder (HCC) Active Problems:   GAD (generalized anxiety disorder)   Vitamin D deficiency   Sialorrhea  Reason for Admission: Miquela Costabile is a 31 year old African-American female with prior psychiatric diagnoses of schizoaffective d/o depressive type and GAD who initially presented to the [facility] health urgent care [facility]) accompanied by her mother with complaints of paranoia. She was previously admitted to [facility] from 6/20-10/10/2022 and treated for schizoaffective disorder, depressive type with multiple medication trials (Geodon, haldol, caplyta, thorazine, Zyprexa, Risperdal) and ultimately had partial response to clozapine. DSS was granted legal guardianship. On 09/10/2022, pt was d/c for competency evaluation per court. On 09/10/22, patient was readmitted to the psych unit after the evaluation    Hospital Summary During the patient's hospitalization, patient had extensive initial psychiatric evaluation, and follow-up psychiatric evaluations every day.   Psychiatric diagnoses provided upon initial assessment:  -Schizoaffective Disorder, depressive type  -GAD    Patient's psychiatric medications were adjusted on admission after competency evaluation:  --Continue Clozaril 200 qhs - for psychosis.  --Continue Ritalin 10 mg qam - for daytime sedation 2/2 clozapine and negative symptoms of schizophrenia. --Continue Zoloft 100 mg daily for depression   During the hospitalization, other adjustments were made to the patient's psychiatric medication regimen:  -Clozaril decreased to 150mg  due to side effects. Due to increased paranoia, she was increased back to 200mg  QHS. Medication was accidentally decreased to 50mg  QHS instead of 250mg  QHS. She was increased back to 100mg  QHS for ongoing paranoia prior to discharge. ANC was monitored  and wnl . Clozaril levels were subtherapeutic (129, 133).  -Ritalin 10mg  qAM was continued for daytime sedation 2/2 clozapine. This was discontinued due to concern for cardiac side effect and patient report that it did not help with alertness.  -Modafinil 100mg  started to address continued sedation even with decreased dose of clozaril and stopped due to worsening psychosis  -Zoloft 100mg  daily for depression and anxiety was started and increased to 150mg  daily.    Patient's care was discussed during the interdisciplinary team meeting every day during the hospitalization.   The patient denied having side effects to prescribed psychiatric medication.   Gradually, patient started adjusting to milieu. The patient was evaluated each day by a clinical provider to ascertain response to treatment. Improvement was noted by the patient's report of decreasing symptoms, improved sleep and appetite, affect, medication tolerance, behavior, and participation in unit programming.  Patient was asked each day to complete a self inventory noting mood, mental status, pain, new symptoms, anxiety and concerns.     Symptoms were reported as significantly decreased or resolved completely by discharge.    On day of discharge, the patient reports that their mood is stable. The patient denied having suicidal thoughts for more than 48 hours prior to discharge.  Patient denies having homicidal thoughts.  Patient denies having auditory hallucinations.  Patient denies any visual hallucinations or other symptoms of psychosis. The patient was motivated to continue taking medication with a goal of continued improvement in mental health.    The patient reports their target psychiatric symptoms of psychosis and paranoia responded well to the psychiatric medications, and the patient reports overall benefit other psychiatric hospitalization. Supportive psychotherapy was provided to the patient. The patient also participated in regular  group therapy while hospitalized. Coping skills, problem solving as well as relaxation therapies were also part of the unit programming.  Labs were reviewed with the patient, and abnormal results were discussed with the patient.   The patient is able to verbalize their individual safety plan to this provider.   # It is recommended to the patient to continue psychiatric medications as prescribed, after discharge from the hospital.     # It is recommended to the patient to follow up with your outpatient psychiatric provider and PCP.   # It was discussed with the patient, the impact of alcohol, drugs, tobacco have been there overall psychiatric and medical wellbeing, and total abstinence from substance use was recommended the patient.ed.   # Prescriptions provided or sent directly to preferred pharmacy at discharge. Patient agreeable to plan. Given opportunity to ask questions. Appears to feel comfortable with discharge.    # In the event of worsening symptoms, the patient is instructed to call the crisis hotline, 911 and or go to the nearest ED for appropriate evaluation and treatment of symptoms. To follow-up with primary care provider for other medical issues, concerns and or health care needs   # Patient was discharged to group home with a plan to follow up as noted below.   Total Time spent with patient: 30 minutes  Presentation  General Appearance:  Casual   Eye Contact: Poor   Speech: Slow   Speech Volume: Decreased   Handedness: Right     Mood and Affect  Mood: Depressed; Anxious   Affect: Flat; Tearful     Thought Process  Thought Processes: Linear   Descriptions of Associations:Intact   Orientation:Full (Time, Place and Person)   Thought Content:Paranoid Ideation   History of Schizophrenia/Schizoaffective disorder:Yes   Duration of Psychotic Symptoms:Greater than six months   Hallucinations: Denies Ideas of Reference:None   Suicidal  Thoughts:Denies Homicidal Thoughts:Denies    Sensorium  Memory: Immediate Fair; Recent Fair; Remote Fair   Judgment: Fair   Insight: Fair     Art therapistxecutive Functions  Concentration: Fair   Attention Span: Fair   Recall: East[employer] KodakFair   Fund of Knowledge: Fair   Language: Fair     Psychomotor Activity  Psychomotor Activity:No increased or decreased psychomotor activity   Assets  Assets: Physical Health; Resilience  Sleep  Sleep: 6.25 hours  Physical Exam: Constitutional:      Appearance: the patient is not toxic-appearing.  Pulmonary:     Effort: Pulmonary effort is normal.  Neurological:     General: No focal deficit present.     Mental Status: the patient is alert and oriented to person, place, and time.   Review of Systems  Respiratory:  Negative for shortness of breath.   Cardiovascular:  Negative for chest pain.  Gastrointestinal:  Negative for abdominal pain, constipation, diarrhea, nausea and vomiting.  Neurological:  Negative for headaches.   Blood pressure 104/84, pulse 88, temperature 97.9 F (36.6 C), temperature source Oral, resp. rate 16, SpO2 100 %. There is no height or weight on file to calculate BMI.  Mental Status Per Nursing Assessment::   On Admission:     General Appearance:  Casual   Eye Contact: Good   Speech: Clear and Coherent; Normal Rate   Speech Volume: Decreased   Handedness: Right     Mood and Affect  Mood: Euthymic   Affect: Restricted     Thought Process  Thought Processes: Linear   Duration of Psychotic Symptoms: Greater than six months   Past Diagnosis of Schizophrenia or Psychoactive disorder: Yes   Descriptions of Associations:Intact   Orientation:Full (Time, Place  and Person)   Thought Content:Paranoid Ideation   Hallucinations:Hallucinations: None   Ideas of Reference:Paranoia   Suicidal Thoughts:Suicidal Thoughts: No   Homicidal Thoughts:Homicidal Thoughts: No     Sensorium   Memory: Immediate Good; Recent Good; Remote Good   Judgment: Impaired   Insight: Lacking     Executive Functions  Concentration: Fair   Attention Span: Fair   Recall: Poor   Fund of Knowledge: Poor   Language: Fair     Psychomotor Activity  Psychomotor Activity:Psychomotor Activity: Normal     Assets  Assets: Communication Skills; Physical Health; Resilience     Sleep  Sleep:Sleep: Good  Demographic Factors:  Adolescent or young adult  Loss Factors: NA  Historical Factors: NA  Risk Reduction Factors:   Positive therapeutic relationship  Continued Clinical Symptoms:  Schizophrenia:   Depressive state  Cognitive Features That Contribute To Risk:  Loss of executive function and Thought constriction (tunnel vision)    Suicide Risk:  Acute Risk: Mild: There are no identifiable suicide plans, no associated intent, mild dysphoria and related symptoms, good self-control (both objective and subjective assessment), few other risk factors, and identifiable protective factors, including available and accessible social support.    Follow-up Information     [facility] Follow up.   Why: A referral has been made for you to begin [employer].  This agency will contact you or your legal guardian to complete an intake to begin services. Contact information: [address] [place] [address] [phone]         [facility] Follow up.   Why: A referral has been made for you to begin services with this agency. This agency will contact you or your legal guardian to schedule an intake appointment. Contact information: Address: [address], [place], [place] [address]  Phone: [phone]        [facility]. Follow up on 10/13/2022.   Why: You are scheduled to attend this group home on 10/13/2022.  Please speak with your legal guardian for further questions or concerns. Contact information: Address: [address] [place], [place] [address]  Phone: [phone]                Plan Of Care/Follow-up recommendations:  Diet: heart healthy Activity: as tolerated    -Follow-up with your outpatient psychiatric provider -instructions on appointment date, time, and address (location) are provided to you in discharge paperwork.   -Take your psychiatric medications as prescribed at discharge - instructions are provided to you in the discharge paperwork -You will be given 7 days of clozapine samples at discharge.  -Your next CBC-D for clozapine prescribing is due on 10-20-2022. Your most recent ANC was 2000 on 10-13-2022 and this was entered into the clozapine REMS. Your group home and legal guardian are tasked with setting up psychiatric care for prescribing and monitoring of clozapine, at discharge (per our social work team at the behavioral health hospital). Your psychiatric provider will order your next lab work.  -You will receive paper prescriptions for 30 days of medications, at discharge from here.    -If you have any of the following symptoms, please call your psychiatrist, PCP, or go to urgent care or the emergency department: -No bowel movement for 3 days -Seizure -chest pain or heart palpitations -fever, chills, sore throat, feeling ill or having flu-like symptoms    -Follow-up with outpatient primary care doctor and other specialists -for management of preventative medicine and any chronic medical  disease: HTN, iron deficiency   -Testing: Follow-up with outpatient provider for abnormal lab results: LDL 140, CHOL 217   -Recommend abstinence from alcohol, tobacco, and other illicit drug use at discharge.    -If your psychiatric symptoms recur, worsen, or if you have side effects to your psychiatric medications, call your outpatient psychiatric provider, 911, [phone] or go to the nearest emergency department.   Karie Fetch, MD PGY-1 10/13/2022, 9:58 AM

## 2022-10-13 NOTE — Discharge Summary (Signed)
Physician Discharge Summary Note  Patient:  Cheryl Adams is an 31 y.o., female MRN:  [id number] DOB:  1991-10-17 Patient phone:  [phone] (home)  Patient address:   [address] [place] [address],  Total Time spent with patient: 45 minutes  Date of Admission:  09/10/2022 Date of Discharge: 10/13/2022  Reason for Admission:  Cheryl Adams is a 31 year old African-American female with prior psychiatric diagnoses of schizoaffective d/o depressive type and GAD who initially presented to the [facility] health urgent care [facility]) accompanied by her mother with complaints of paranoia. She was previously admitted to [facility] from 6/20-10/10/2022 and treated for schizoaffective disorder, depressive type with multiple medication trials (Geodon, haldol, caplyta, thorazine, Zyprexa, Risperdal) and ultimately had partial response to clozapine. DSS was granted legal guardianship. On 09/10/2022, pt was d/c for competency evaluation per court. On 09/10/22, patient was readmitted to the psych unit after the evaluation   Principal Problem: Schizoaffective disorder [facility]) Discharge Diagnoses: Principal Problem:   Schizoaffective disorder ([place]) Active Problems:   GAD (generalized anxiety disorder)   Vitamin D deficiency   Sialorrhea  Past Psychiatric History:  MDD, GAD   Past Medical History:  Past Medical History:  Diagnosis Date   Anemia    Chronic tonsillitis 10/2014   Cough 11/09/2014   Difficulty swallowing pills    No psychiatric disorder found after evaluation 04/14/2022    Past Surgical History:  Procedure Laterality Date   TONSILLECTOMY AND ADENOIDECTOMY Bilateral 11/14/2014   Procedure: BILATERAL TONSILLECTOMY AND ADENOIDECTOMY;  Surgeon: Jodi Marble, MD;  Location: [facility];  Service: ENT;  Laterality: Bilateral;   TYMPANOSTOMY TUBE PLACEMENT     Family History: History reviewed. No pertinent family history. Family Psychiatric  History: None  reported  Social History:  Social History   Substance and Sexual Activity  Alcohol Use No     Social History   Substance and Sexual Activity  Drug Use No    Social History   Socioeconomic History   Marital status: Single    Spouse name: Not on file   Number of children: Not on file   Years of education: Not on file   Highest education level: Not on file  Occupational History   Not on file  Tobacco Use   Smoking status: Never   Smokeless tobacco: Never  Substance and Sexual Activity   Alcohol use: No   Drug use: No   Sexual activity: Not on file  Other Topics Concern   Not on file  Social History Narrative   Not on file   Social Determinants of Health   Financial Resource Strain: Not on file  Food Insecurity: Unknown (09/10/2022)   Hunger Vital Sign    Worried About Running Out of Food in the Last Year: Patient refused    [place] in the Last Year: Patient refused  Transportation Needs: Unknown (09/10/2022)   PRAPARE - Hydrologist (Medical): Patient refused    Lack of Transportation (Non-Medical): Patient refused  Physical Activity: Not on file  Stress: Not on file  Social Connections: Not on file    Hospital Course:   During the patient's hospitalization, patient had extensive initial psychiatric evaluation, and follow-up psychiatric evaluations every day.  Psychiatric diagnoses provided upon initial assessment:  -Schizoaffective Disorder, depressive type  -GAD   Patient's psychiatric medications were adjusted on admission after competency evaluation:  --Continue Clozaril 200 qhs - for psychosis.  --Continue Ritalin 10 mg qam -  for daytime sedation 2/2 clozapine and negative symptoms of schizophrenia. --Continue Zoloft 100 mg daily for depression  During the hospitalization, other adjustments were made to the patient's psychiatric medication regimen:  -Clozaril decreased to 150mg  due to side effects. Due to increased  paranoia, she was increased back to 200mg  QHS. Medication was accidentally decreased to 50mg  QHS instead of 250mg  QHS. She was increased back to 100mg  QHS for ongoing paranoia prior to discharge. ANC was monitored and wnl . Clozaril levels were subtherapeutic (129, 133).  -Ritalin 10mg  qAM was continued for daytime sedation 2/2 clozapine. This was discontinued due to concern for cardiac side effect and patient report that it did not help with alertness.  -Modafinil 100mg  started to address continued sedation even with decreased dose of clozaril and stopped due to worsening psychosis  -Zoloft 100mg  daily for depression and anxiety was started and increased to 150mg  daily.   Patient's care was discussed during the interdisciplinary team meeting every day during the hospitalization.  The patient denied having side effects to prescribed psychiatric medication.  Gradually, patient started adjusting to milieu. The patient was evaluated each day by a clinical provider to ascertain response to treatment. Improvement was noted by the patient's report of decreasing symptoms, improved sleep and appetite, affect, medication tolerance, behavior, and participation in unit programming.  Patient was asked each day to complete a self inventory noting mood, mental status, pain, new symptoms, anxiety and concerns.    Symptoms were reported as significantly decreased or resolved completely by discharge.   On day of discharge, the patient reports that their mood is stable. The patient denied having suicidal thoughts for more than 48 hours prior to discharge.  Patient denies having homicidal thoughts.  Patient denies having auditory hallucinations.  Patient denies any visual hallucinations or other symptoms of psychosis. The patient was motivated to continue taking medication with a goal of continued improvement in mental health.   The patient reports their target psychiatric symptoms of psychosis and paranoia responded  well to the psychiatric medications, and the patient reports overall benefit other psychiatric hospitalization. Supportive psychotherapy was provided to the patient. The patient also participated in regular group therapy while hospitalized. Coping skills, problem solving as well as relaxation therapies were also part of the unit programming.  Labs were reviewed with the patient, and abnormal results were discussed with the patient.  The patient is able to verbalize their individual safety plan to this provider.  # It is recommended to the patient to continue psychiatric medications as prescribed, after discharge from the hospital.    # It is recommended to the patient to follow up with your outpatient psychiatric provider and PCP.  # It was discussed with the patient, the impact of alcohol, drugs, tobacco have been there overall psychiatric and medical wellbeing, and total abstinence from substance use was recommended the patient.ed.  # Prescriptions provided or sent directly to preferred pharmacy at discharge. Patient agreeable to plan. Given opportunity to ask questions. Appears to feel comfortable with discharge.    # In the event of worsening symptoms, the patient is instructed to call the crisis hotline, 911 and or go to the nearest ED for appropriate evaluation and treatment of symptoms. To follow-up with primary care provider for other medical issues, concerns and or health care needs  # Patient was discharged to group home with a plan to follow up as noted below.   Physical Findings: AIMS: Facial and Oral Movements Muscles of Facial Expression: None, normal  Lips and Perioral Area: None, normal Jaw: None, normal Tongue: None, normal,Extremity Movements Upper (arms, wrists, hands, fingers): None, normal Lower (legs, knees, ankles, toes): None, normal, Trunk Movements Neck, shoulders, hips: None, normal, Overall Severity Severity of abnormal movements (highest score from questions  above): None, normal Incapacitation due to abnormal movements: None, normal Patient's awareness of abnormal movements (rate only patient's report): No Awareness, Dental Status Current problems with teeth and/or dentures?: No Does patient usually wear dentures?: No      Musculoskeletal: Strength & Muscle Tone: within normal limits Gait & Station: normal Patient leans: N/A   Psychiatric Specialty Exam:  Presentation  General Appearance:  Casual  Eye Contact: Poor  Speech: Slow  Speech Volume: Decreased  Handedness: Right   Mood and Affect  Mood: Depressed; Anxious  Affect: Flat; Tearful   Thought Process  Thought Processes: Linear  Descriptions of Associations:Intact  Orientation:Full (Time, Place and Person)  Thought Content:Paranoid Ideation  History of Schizophrenia/Schizoaffective disorder:Yes  Duration of Psychotic Symptoms:Greater than six months  Hallucinations: Denies Ideas of Reference:None  Suicidal Thoughts:Denies Homicidal Thoughts:Denies   Sensorium  Memory: Immediate Fair; Recent Fair; Remote Fair  Judgment: Fair  Insight: Fair   Community education officer  Concentration: Fair  Attention Span: Fair  Recall: [employer] of Knowledge: Fair  Language: Fair   Psychomotor Activity  Psychomotor Activity:No increased or decreased psychomotor activity  Assets  Assets: Physical Health; Resilience   Sleep  Sleep: 6.25 hours   Physical Exam Constitutional:      Appearance: the patient is not toxic-appearing.  Pulmonary:     Effort: Pulmonary effort is normal.  Neurological:     General: No focal deficit present.     Mental Status: the patient is alert and oriented to person and time. She is oriented to type of building but not city or state.   Review of Systems  Respiratory:  Negative for shortness of breath.   Cardiovascular:  Negative for chest pain.  Gastrointestinal:  Negative for abdominal pain, constipation,  diarrhea, nausea and vomiting.  Neurological:  Negative for headaches.    Blood pressure 104/84, pulse 88, temperature 97.9 F (36.6 C), temperature source Oral, resp. rate 16, SpO2 100 %. There is no height or weight on file to calculate BMI.   Social History   Tobacco Use  Smoking Status Never  Smokeless Tobacco Never   Tobacco Cessation:  N/A, patient does not currently use tobacco products   Blood Alcohol level:  Lab Results  Component Value Date   ETH <10 10/16/2021   ETH <10 0000000    Metabolic Disorder Labs:  Lab Results  Component Value Date   HGBA1C 5.2 05/18/2022   MPG 102.54 05/18/2022   MPG 120 04/29/2021   Lab Results  Component Value Date   PROLACTIN 18.5 06/18/2022   PROLACTIN 58.3 (H) 06/06/2022   Lab Results  Component Value Date   CHOL 217 (H) 05/18/2022   TRIG 31 05/18/2022   HDL 71 05/18/2022   CHOLHDL 3.1 05/18/2022   VLDL 6 05/18/2022   LDLCALC 140 (H) 05/18/2022   LDLCALC 94 05/02/2021    See Psychiatric Specialty Exam and Suicide Risk Assessment completed by Attending Physician prior to discharge.  Discharge destination:  Other:  Group Home  Is patient on multiple antipsychotic therapies at discharge:  No   Has Patient had three or more failed trials of antipsychotic monotherapy by history:  Yes,   Antipsychotic medications that previously failed include:   1.  Geodon.,  2.  Haldol., and 3.  Zyprexa.  Recommended Plan for Multiple Antipsychotic Therapies: NA  Discharge Instructions     Ambulatory referral to Sleep Studies   Complete by: As directed    Evaluation for OSA given risk factors and symptoms of hypersomnolence, daytime fatigue, and loud snoring   Diet - low sodium heart healthy   Complete by: As directed    Increase activity slowly   Complete by: As directed       Allergies as of 10/13/2022   No Known Allergies      Medication List     STOP taking these medications    methylphenidate 10 MG  tablet Commonly known as: RITALIN       TAKE these medications      Indication  amLODipine 10 MG tablet Commonly known as: NORVASC Take 1 tablet (10 mg total) by mouth daily.  Indication: High Blood Pressure Disorder   antiseptic oral rinse Liqd 15 mLs by Mouth Rinse route 5 (five) times daily as needed for dry mouth.  Indication: dry mouth   atenolol 25 MG tablet Commonly known as: TENORMIN Take 0.5 tablets (12.5 mg total) by mouth daily. Start taking on: October 14, 2022  Indication: High Blood Pressure Disorder   atropine 1 % ophthalmic solution Place 1 drop under the tongue 3 (three) times daily.  Indication: sialorrhea   cloZAPine 100 MG tablet Commonly known as: CLOZARIL Take 1 tablet (100 mg total) by mouth at bedtime. You will receive 7 day sample at discharge. Your outpatient psychiatrist will continue prescribing of this medication. What changed:  medication strength how much to take additional instructions  Indication: Schizophrenia   docusate sodium 100 MG capsule Commonly known as: COLACE Take 1 capsule (100 mg total) by mouth 2 (two) times daily.  Indication: Constipation   ferrous sulfate 325 (65 FE) MG tablet Take 1 tablet (325 mg total) by mouth daily.  Indication: Anemia From Inadequate Iron in the Body   metFORMIN 500 MG 24 hr tablet Commonly known as: GLUCOPHAGE-XR Take 1 tablet (500 mg total) by mouth daily with breakfast. Start taking on: October 14, 2022  Indication: Antipsychotic Therapy-Induced Weight Gain   polyethylene glycol 17 g packet Commonly known as: MIRALAX / GLYCOLAX Take 17 g by mouth daily.  Indication: Constipation   sertraline 50 MG tablet Commonly known as: ZOLOFT Take 3 tablets (150 mg total) by mouth daily. Start taking on: October 14, 2022 What changed:  medication strength how much to take  Indication: Major Depressive Disorder   traZODone 50 MG tablet Commonly known as: DESYREL Take 1 tablet (50 mg  total) by mouth at bedtime as needed for sleep.  Indication: Trouble Sleeping, Major Depressive Disorder   vitamin D3 25 MCG tablet Commonly known as: CHOLECALCIFEROL Take 1 tablet (1,000 Units total) by mouth daily.  Indication: Vitamin D Deficiency        Follow-up Information     [facility] Follow up.   Why: A referral has been made for you to begin [employer].  This agency will contact you or your legal guardian to complete an intake to begin services. Contact information: [address] [place] [address] [phone]         [facility] Follow up.   Why: A referral has been made for you to begin services with this agency. This agency will contact you or your legal guardian to schedule an intake appointment. Contact information: Address: [address], [place],  [place] [address]  Phone: [phone]        [facility]. Follow up on 10/13/2022.   Why: You are scheduled to attend this group home on 10/13/2022.  Please speak with your legal guardian for further questions or concerns. Contact information: Address: [place], [place] [address]  Phone: [phone]                Follow-up recommendations:   Diet: heart healthy Activity: as tolerated   -Follow-up with your outpatient psychiatric provider -instructions on appointment date, time, and address (location) are provided to you in discharge paperwork.  -Take your psychiatric medications as prescribed at discharge - instructions are provided to you in the discharge paperwork -You will be given 7 days of clozapine samples at discharge.  -Your next CBC-D for clozapine prescribing is due on 10-20-2022. Your most recent [place] was 2000 on 10-13-2022 and this was entered into the clozapine REMS. Your group home and legal guardian are tasked with setting up psychiatric care for prescribing and monitoring of clozapine, at discharge (per our social work  team at the behavioral health hospital). Your psychiatric provider will order your next lab work.  -You will receive paper prescriptions for 30 days of medications, at discharge from here.   -If you have any of the following symptoms, please call your psychiatrist, PCP, or go to urgent care or the emergency department: -No bowel movement for 3 days -Seizure -chest pain or heart palpitations -fever, chills, sore throat, feeling ill or having flu-like symptoms   -Follow-up with outpatient primary care doctor and other specialists -for management of preventative medicine and any chronic medical disease: HTN, iron deficiency  -Testing: Follow-up with outpatient provider for abnormal lab results: LDL 140, CHOL 217  -Recommend abstinence from alcohol, tobacco, and other illicit drug use at discharge.   -If your psychiatric symptoms recur, worsen, or if you have side effects to your psychiatric medications, call your outpatient psychiatric provider, 911, [phone] or go to the nearest emergency department.  -If suicidal thoughts occur, call your outpatient psychiatric provider, 911, [phone] or go to the nearest emergency department.   Comments:  N/A  Signed: Rolanda Lundborg, MD PGY-1 10/13/2022, 9:56 AM

## 2022-10-13 NOTE — Discharge Instructions (Signed)
-  Follow-up with your outpatient psychiatric provider -instructions on appointment date, time, and address (location) are provided to you in discharge paperwork.  -Take your psychiatric medications as prescribed at discharge - instructions are provided to you in the discharge paperwork -You will be given 7 days of clozapine samples at discharge.  -Your next CBC-D for clozapine prescribing is due on 10-20-2022. Your most recent ANC was 2000 on 10-13-2022 and this was entered into the clozapine REMS. Your group home and legal guardian are tasked with setting up psychiatric care for prescribing and monitoring of clozapine, at discharge (per our social work team at the behavioral health hospital). Your psychiatric provider will order your next lab work.  -You will receive paper prescriptions for 30 days of medications, at discharge from here.   -If you have any of the following symptoms, please call your psychiatrist, PCP, or go to urgent care or the emergency department: -No bowel movement for 3 days -Seizure -chest pain or heart palpitations -fever, chills, sore throat, feeling ill or having flu-like symptoms   -Follow-up with outpatient primary care doctor and other specialists -for management of preventative medicine and any chronic medical disease.  -Recommend abstinence from alcohol, tobacco, and other illicit drug use at discharge.   -If your psychiatric symptoms recur, worsen, or if you have side effects to your psychiatric medications, call your outpatient psychiatric provider, 911, 988 or go to the nearest emergency department.  -If suicidal thoughts occur, call your outpatient psychiatric provider, 911, 988 or go to the nearest emergency department.

## 2022-10-13 NOTE — Group Note (Signed)
Recreation Therapy Group Note   Group Topic:Health and Wellness  Group Date: 10/13/2022 Start Time: 1000 End Time: 1030 Facilitators: Wadie Liew-McCall, LRT, CTRS Location: [address] Day[address]   Goal Area(s) Addresses:  Patient will define components of whole wellness. Patient will verbalize benefit of whole wellness.  Group Description: Dance/Exercise.  LRT and patients discussed the importance of taking time for themselves and making sure they make the most of it.  LRT expressed to patients they would be taking turns leading the group in whatever dance or exercise of their choosing.  The goal was at least 30 minutes of movement.  Patients were encouraged to take breaks or get water if needed.    Affect/Mood: Appropriate   Participation Level: Active   Participation Quality: Independent   Behavior: Appropriate   Speech/Thought Process: Barely audible    Insight: Fair   Judgement: Fair    Modes of Intervention: Music   Patient Response to Interventions:  Attentive and Receptive   Education Outcome:  Acknowledges education and In group clarification offered    Clinical Observations/Individualized Feedback: Pt was quiet and engaged with encouragement from peers and staff.  Pt did smile every now and then.  Pt stated to herself for the most part but did participate.    Plan: Continue to engage patient in RT group sessions 2-3x/week.   Jahzaria Vary-McCall, LRT,CTRS 10/13/2022 11:48 AM

## 2022-12-15 IMAGING — CT CT HEAD W/O CM
4 series · 17 of 47 positions shown, 19 images · non-contrast
Comparison: None.

CLINICAL DATA: Altered mental status

EXAM:
CT HEAD WITHOUT CONTRAST
TECHNIQUE: Contiguous axial images were obtained from the base of the skull
through the vertex without intravenous contrast.

[Series 2: head wo · axial · 0.39mm/px · z∈[-126,-6]mm · 7 of 32 slices shown, 9 images]
[im 4/32  brain]
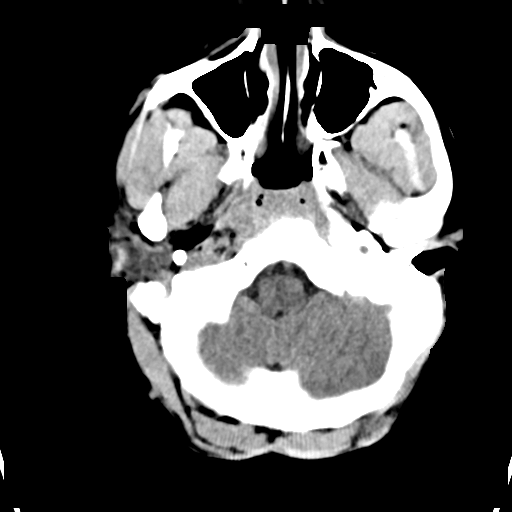
[im 4/32  bone]
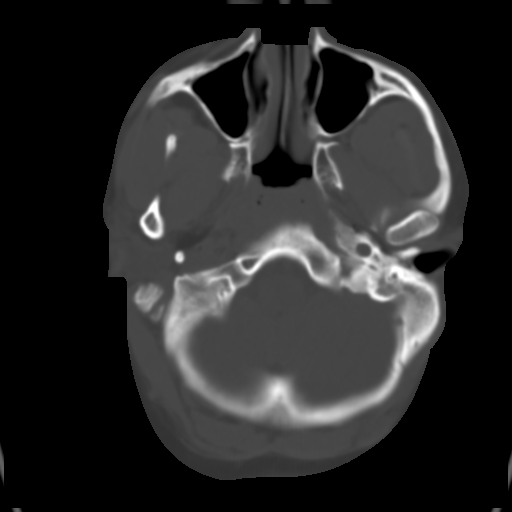
[im 8/32  brain]
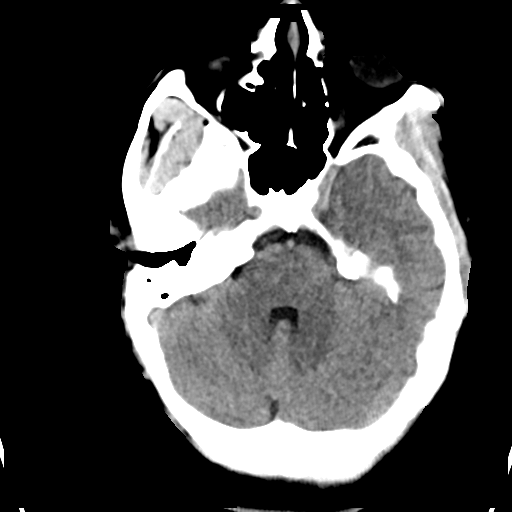
[im 12/32  brain]
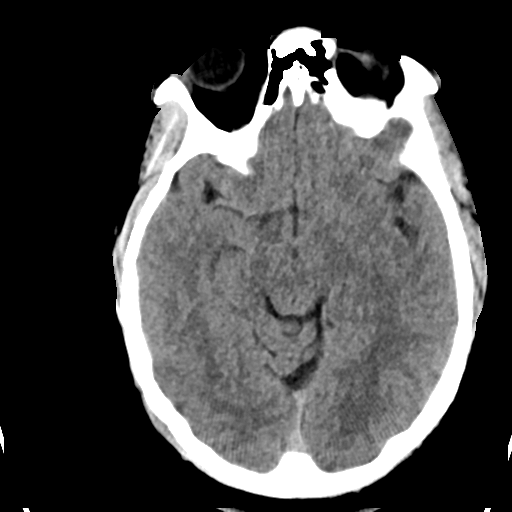
[im 16/32  brain]
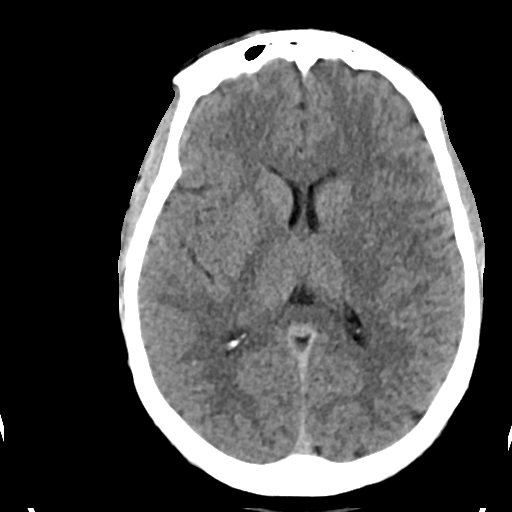
[im 20/32  brain]
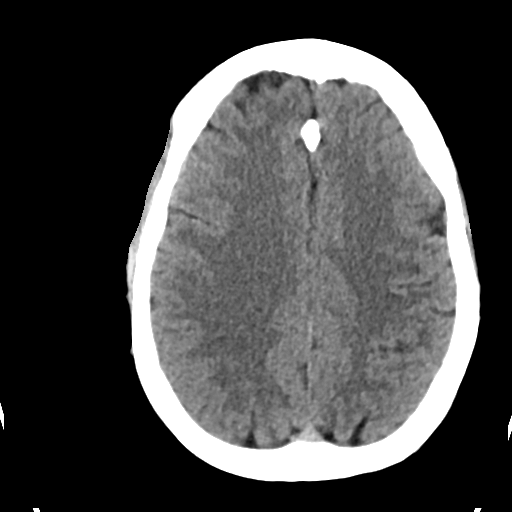
[im 20/32  bone]
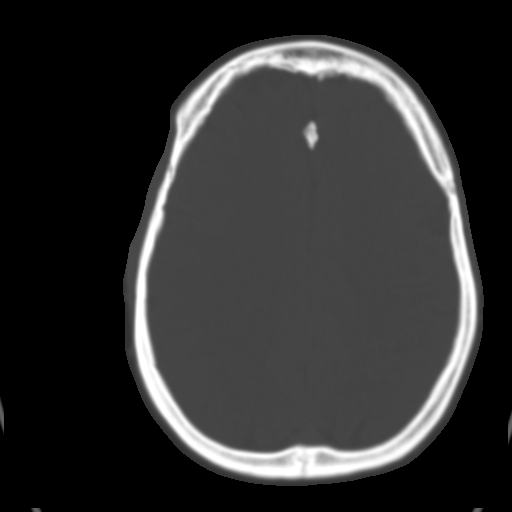
[im 24/32  brain]
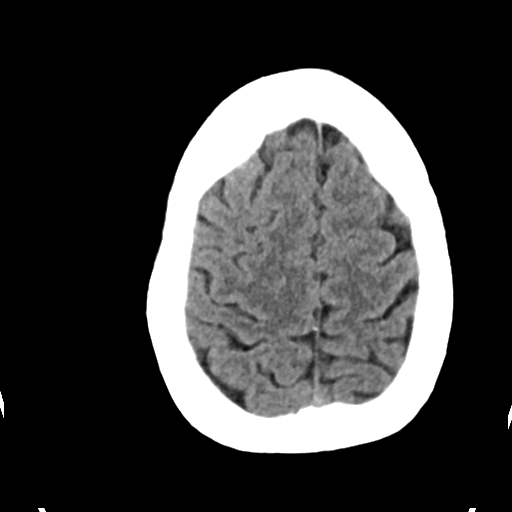
[im 28/32  brain]
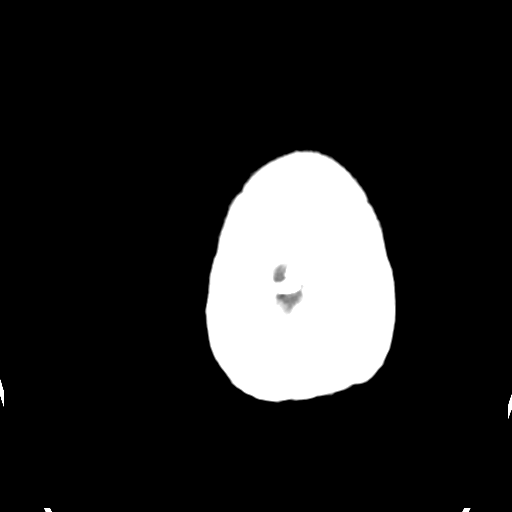

[Series 3: head bone · axial · 0.39mm/px · z∈[-128,-72]mm · 4 of 79 slices shown]
[im 8/79  bone]
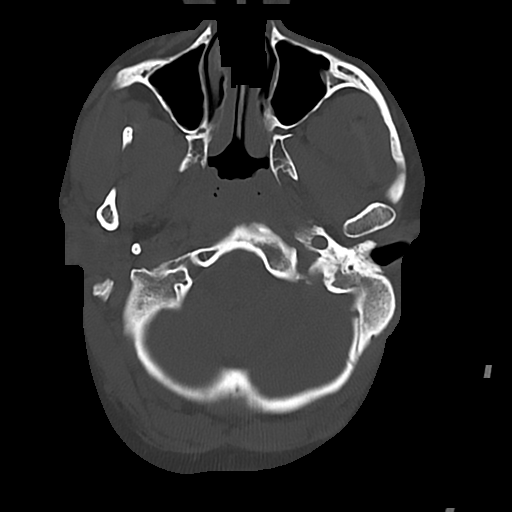
[im 16/79  bone]
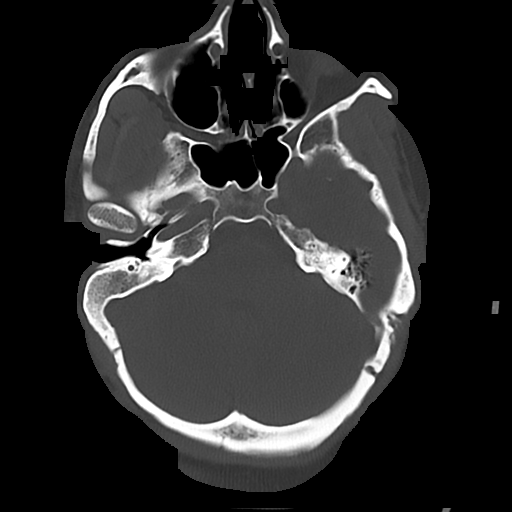
[im 24/79  bone]
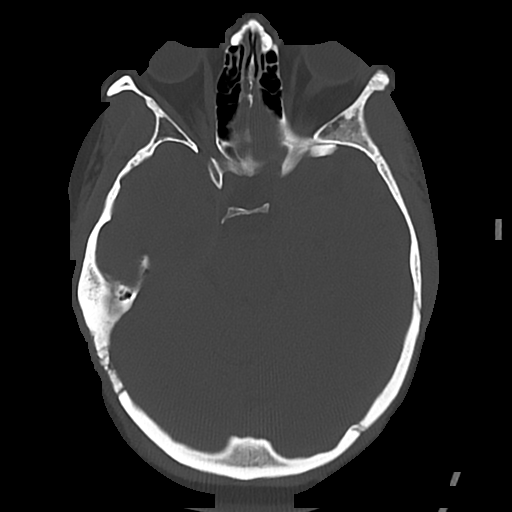
[im 36/79  bone]
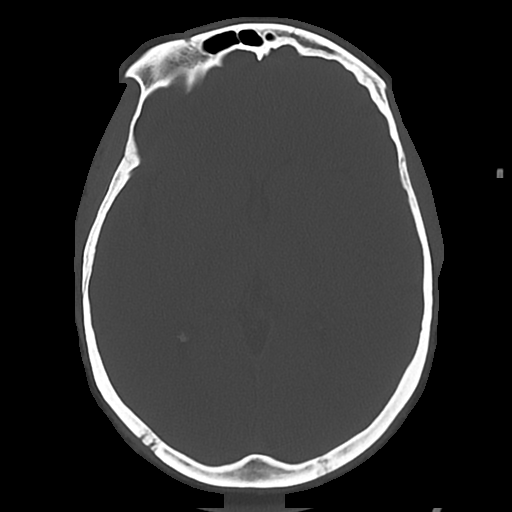

[Series 4: cor soft · coronal · 0.33mm/px · 3 of 68 slices shown]
[im 23/68  brain]
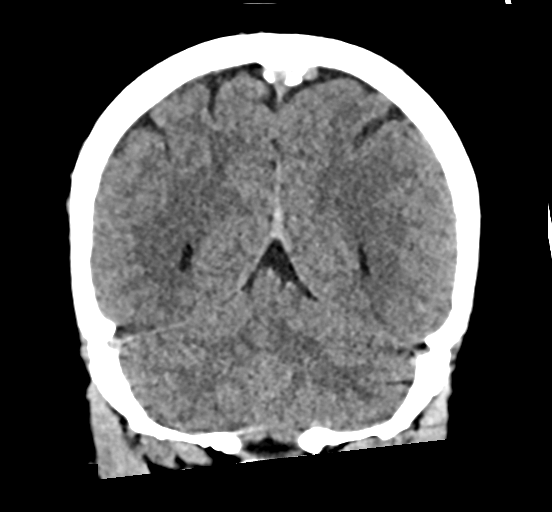
[im 30/68  brain]
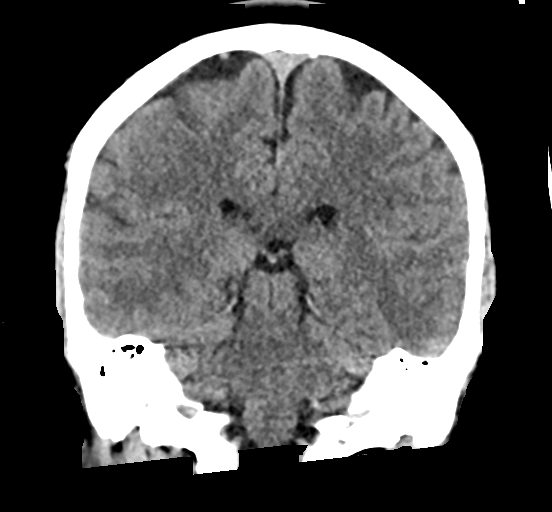
[im 38/68  brain]
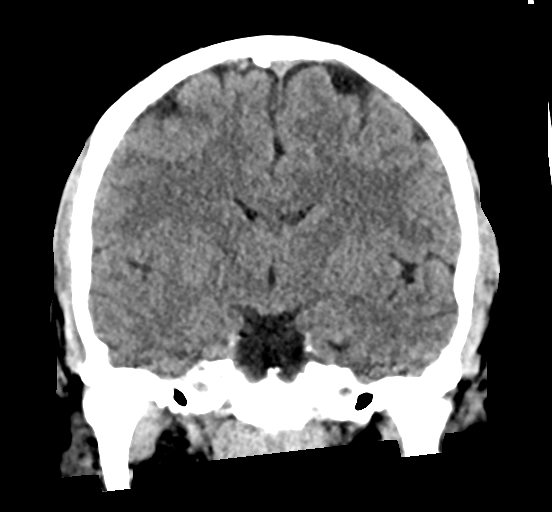

[Series 5: sag soft · sagittal · 0.33mm/px · 3 of 57 slices shown]
[im 19/57  brain]
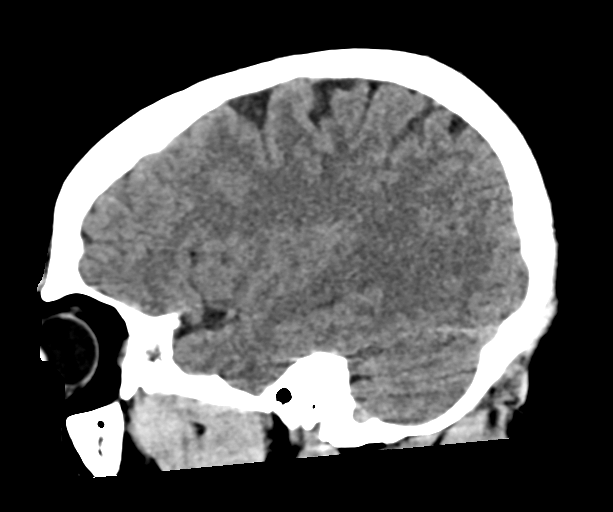
[im 29/57  brain]
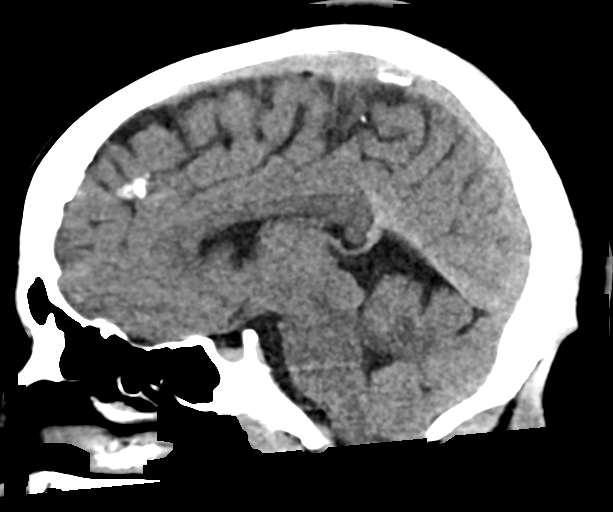
[im 38/57  brain]
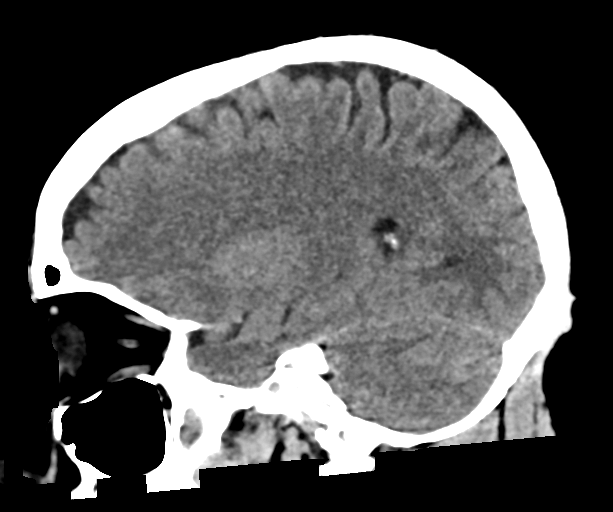

[17 of 47 positions shown; findings below may reference images not displayed]

FINDINGS: Brain: No evidence of acute infarction, hemorrhage, hydrocephalus,
extra-axial collection or mass lesion/mass effect.

Vascular: No hyperdense vessel or unexpected calcification.

Skull: Normal. Negative for fracture or focal lesion.

Sinuses/Orbits: No acute finding.

Other: None.
IMPRESSION: No acute intracranial findings.
